# Patient Record
Sex: Female | Born: 1945 | Race: Black or African American | Hispanic: No | Marital: Single | Smoking: Former smoker
Health system: Southern US, Community
[De-identification: ages and names within clinical notes are randomized; demographics above are authoritative.]

## PROBLEM LIST (undated history)

## (undated) DIAGNOSIS — E785 Hyperlipidemia, unspecified: Secondary | ICD-10-CM

## (undated) DIAGNOSIS — I350 Nonrheumatic aortic (valve) stenosis: Secondary | ICD-10-CM

## (undated) DIAGNOSIS — K219 Gastro-esophageal reflux disease without esophagitis: Secondary | ICD-10-CM

## (undated) DIAGNOSIS — M51369 Other intervertebral disc degeneration, lumbar region without mention of lumbar back pain or lower extremity pain: Secondary | ICD-10-CM

## (undated) DIAGNOSIS — T7840XA Allergy, unspecified, initial encounter: Secondary | ICD-10-CM

## (undated) DIAGNOSIS — G709 Myoneural disorder, unspecified: Secondary | ICD-10-CM

## (undated) DIAGNOSIS — I10 Essential (primary) hypertension: Secondary | ICD-10-CM

## (undated) DIAGNOSIS — G4730 Sleep apnea, unspecified: Secondary | ICD-10-CM

## (undated) DIAGNOSIS — J45901 Unspecified asthma with (acute) exacerbation: Secondary | ICD-10-CM

## (undated) DIAGNOSIS — J309 Allergic rhinitis, unspecified: Secondary | ICD-10-CM

## (undated) DIAGNOSIS — D649 Anemia, unspecified: Secondary | ICD-10-CM

## (undated) DIAGNOSIS — M1711 Unilateral primary osteoarthritis, right knee: Secondary | ICD-10-CM

## (undated) DIAGNOSIS — E7800 Pure hypercholesterolemia, unspecified: Secondary | ICD-10-CM

## (undated) DIAGNOSIS — Z952 Presence of prosthetic heart valve: Secondary | ICD-10-CM

## (undated) DIAGNOSIS — E669 Obesity, unspecified: Secondary | ICD-10-CM

## (undated) DIAGNOSIS — R011 Cardiac murmur, unspecified: Secondary | ICD-10-CM

## (undated) DIAGNOSIS — E119 Type 2 diabetes mellitus without complications: Secondary | ICD-10-CM

## (undated) DIAGNOSIS — M1990 Unspecified osteoarthritis, unspecified site: Secondary | ICD-10-CM

## (undated) DIAGNOSIS — R1032 Left lower quadrant pain: Secondary | ICD-10-CM

## (undated) DIAGNOSIS — M4800 Spinal stenosis, site unspecified: Secondary | ICD-10-CM

## (undated) DIAGNOSIS — H269 Unspecified cataract: Secondary | ICD-10-CM

## (undated) DIAGNOSIS — R609 Edema, unspecified: Secondary | ICD-10-CM

## (undated) DIAGNOSIS — J45909 Unspecified asthma, uncomplicated: Secondary | ICD-10-CM

## (undated) DIAGNOSIS — F329 Major depressive disorder, single episode, unspecified: Secondary | ICD-10-CM

## (undated) DIAGNOSIS — M1710 Unilateral primary osteoarthritis, unspecified knee: Secondary | ICD-10-CM

## (undated) DIAGNOSIS — F411 Generalized anxiety disorder: Secondary | ICD-10-CM

## (undated) DIAGNOSIS — L259 Unspecified contact dermatitis, unspecified cause: Secondary | ICD-10-CM

## (undated) DIAGNOSIS — C50919 Malignant neoplasm of unspecified site of unspecified female breast: Secondary | ICD-10-CM

## (undated) DIAGNOSIS — Z923 Personal history of irradiation: Secondary | ICD-10-CM

## (undated) DIAGNOSIS — N951 Menopausal and female climacteric states: Secondary | ICD-10-CM

## (undated) DIAGNOSIS — M5136 Other intervertebral disc degeneration, lumbar region: Secondary | ICD-10-CM

## (undated) HISTORY — DX: Unspecified cataract: H26.9

## (undated) HISTORY — DX: Unilateral primary osteoarthritis, unspecified knee: M17.10

## (undated) HISTORY — DX: Essential (primary) hypertension: I10

## (undated) HISTORY — DX: Obesity, unspecified: E66.9

## (undated) HISTORY — DX: Unspecified asthma, uncomplicated: J45.909

## (undated) HISTORY — PX: CARDIAC VALVE REPLACEMENT: SHX585

## (undated) HISTORY — DX: Spinal stenosis, site unspecified: M48.00

## (undated) HISTORY — DX: Generalized anxiety disorder: F41.1

## (undated) HISTORY — PX: BACK SURGERY: SHX140

## (undated) HISTORY — DX: Type 2 diabetes mellitus without complications: E11.9

## (undated) HISTORY — DX: Malignant neoplasm of unspecified site of unspecified female breast: C50.919

## (undated) HISTORY — DX: Unspecified contact dermatitis, unspecified cause: L25.9

## (undated) HISTORY — DX: Left lower quadrant pain: R10.32

## (undated) HISTORY — DX: Major depressive disorder, single episode, unspecified: F32.9

## (undated) HISTORY — DX: Allergic rhinitis, unspecified: J30.9

## (undated) HISTORY — DX: Pure hypercholesterolemia, unspecified: E78.00

## (undated) HISTORY — PX: CATARACT EXTRACTION: SUR2

## (undated) HISTORY — DX: Gastro-esophageal reflux disease without esophagitis: K21.9

## (undated) HISTORY — DX: Anemia, unspecified: D64.9

## (undated) HISTORY — DX: Myoneural disorder, unspecified: G70.9

## (undated) HISTORY — DX: Edema, unspecified: R60.9

## (undated) HISTORY — DX: Unilateral primary osteoarthritis, right knee: M17.11

## (undated) HISTORY — PX: THYROIDECTOMY, PARTIAL: SHX18

## (undated) HISTORY — DX: Nonrheumatic aortic (valve) stenosis: I35.0

## (undated) HISTORY — PX: CARDIAC CATHETERIZATION: SHX172

## (undated) HISTORY — DX: Hyperlipidemia, unspecified: E78.5

## (undated) HISTORY — PX: SHOULDER ARTHROSCOPY: SHX128

## (undated) HISTORY — DX: Sleep apnea, unspecified: G47.30

## (undated) HISTORY — PX: OTHER SURGICAL HISTORY: SHX169

## (undated) HISTORY — PX: JOINT REPLACEMENT: SHX530

## (undated) HISTORY — DX: Allergy, unspecified, initial encounter: T78.40XA

## (undated) HISTORY — PX: CARPAL TUNNEL RELEASE: SHX101

## (undated) HISTORY — DX: Unspecified asthma with (acute) exacerbation: J45.901

## (undated) HISTORY — DX: Unspecified osteoarthritis, unspecified site: M19.90

## (undated) HISTORY — PX: SPINE SURGERY: SHX786

## (undated) HISTORY — DX: Menopausal and female climacteric states: N95.1

---

## 1974-04-29 HISTORY — PX: OTHER SURGICAL HISTORY: SHX169

## 2000-08-22 ENCOUNTER — Encounter: Payer: Self-pay | Admitting: Endocrinology

## 2000-08-22 ENCOUNTER — Ambulatory Visit (HOSPITAL_COMMUNITY): Admission: RE | Admit: 2000-08-22 | Discharge: 2000-08-22 | Payer: Self-pay | Admitting: Endocrinology

## 2000-09-23 ENCOUNTER — Other Ambulatory Visit: Admission: RE | Admit: 2000-09-23 | Discharge: 2000-09-23 | Payer: Self-pay | Admitting: Internal Medicine

## 2000-10-01 ENCOUNTER — Other Ambulatory Visit: Admission: RE | Admit: 2000-10-01 | Discharge: 2000-10-01 | Payer: Self-pay | Admitting: Obstetrics and Gynecology

## 2000-10-01 ENCOUNTER — Encounter (INDEPENDENT_AMBULATORY_CARE_PROVIDER_SITE_OTHER): Payer: Self-pay

## 2002-02-15 ENCOUNTER — Encounter: Admission: RE | Admit: 2002-02-15 | Discharge: 2002-02-15 | Payer: Self-pay | Admitting: Neurosurgery

## 2002-02-15 ENCOUNTER — Encounter: Payer: Self-pay | Admitting: Neurosurgery

## 2002-03-31 ENCOUNTER — Encounter: Payer: Self-pay | Admitting: Neurosurgery

## 2002-04-06 ENCOUNTER — Encounter: Payer: Self-pay | Admitting: Neurosurgery

## 2002-04-06 ENCOUNTER — Inpatient Hospital Stay (HOSPITAL_COMMUNITY): Admission: RE | Admit: 2002-04-06 | Discharge: 2002-04-12 | Payer: Self-pay | Admitting: Neurosurgery

## 2002-05-05 ENCOUNTER — Encounter: Payer: Self-pay | Admitting: Neurosurgery

## 2002-05-05 ENCOUNTER — Encounter: Admission: RE | Admit: 2002-05-05 | Discharge: 2002-05-05 | Payer: Self-pay | Admitting: Neurosurgery

## 2002-05-30 ENCOUNTER — Emergency Department (HOSPITAL_COMMUNITY): Admission: EM | Admit: 2002-05-30 | Discharge: 2002-05-30 | Payer: Self-pay | Admitting: Emergency Medicine

## 2002-05-31 ENCOUNTER — Encounter: Payer: Self-pay | Admitting: Neurosurgery

## 2002-05-31 ENCOUNTER — Encounter: Admission: RE | Admit: 2002-05-31 | Discharge: 2002-05-31 | Payer: Self-pay | Admitting: Neurosurgery

## 2002-06-07 ENCOUNTER — Inpatient Hospital Stay (HOSPITAL_COMMUNITY): Admission: AD | Admit: 2002-06-07 | Discharge: 2002-06-18 | Payer: Self-pay | Admitting: Internal Medicine

## 2002-06-08 ENCOUNTER — Encounter: Payer: Self-pay | Admitting: Internal Medicine

## 2002-06-10 ENCOUNTER — Encounter: Payer: Self-pay | Admitting: Internal Medicine

## 2002-06-11 ENCOUNTER — Encounter: Payer: Self-pay | Admitting: Endocrinology

## 2002-06-17 ENCOUNTER — Encounter: Payer: Self-pay | Admitting: Internal Medicine

## 2002-06-23 ENCOUNTER — Encounter (HOSPITAL_BASED_OUTPATIENT_CLINIC_OR_DEPARTMENT_OTHER): Admission: RE | Admit: 2002-06-23 | Discharge: 2002-09-21 | Payer: Self-pay | Admitting: Internal Medicine

## 2002-08-02 ENCOUNTER — Encounter: Payer: Self-pay | Admitting: Emergency Medicine

## 2002-08-02 ENCOUNTER — Inpatient Hospital Stay (HOSPITAL_COMMUNITY): Admission: EM | Admit: 2002-08-02 | Discharge: 2002-08-03 | Payer: Self-pay | Admitting: Emergency Medicine

## 2002-08-03 ENCOUNTER — Encounter: Payer: Self-pay | Admitting: Endocrinology

## 2002-08-30 ENCOUNTER — Encounter: Admission: RE | Admit: 2002-08-30 | Discharge: 2002-08-30 | Payer: Self-pay | Admitting: Neurosurgery

## 2002-08-30 ENCOUNTER — Encounter: Payer: Self-pay | Admitting: Neurosurgery

## 2002-09-30 ENCOUNTER — Encounter (HOSPITAL_BASED_OUTPATIENT_CLINIC_OR_DEPARTMENT_OTHER): Admission: RE | Admit: 2002-09-30 | Discharge: 2002-12-29 | Payer: Self-pay | Admitting: Internal Medicine

## 2002-11-29 ENCOUNTER — Encounter: Admission: RE | Admit: 2002-11-29 | Discharge: 2002-11-29 | Payer: Self-pay | Admitting: Neurosurgery

## 2002-11-29 ENCOUNTER — Encounter: Payer: Self-pay | Admitting: Neurosurgery

## 2003-05-17 ENCOUNTER — Encounter: Admission: RE | Admit: 2003-05-17 | Discharge: 2003-05-17 | Payer: Self-pay | Admitting: Neurosurgery

## 2004-03-27 ENCOUNTER — Ambulatory Visit: Payer: Self-pay | Admitting: Adult Health

## 2005-01-01 ENCOUNTER — Ambulatory Visit: Payer: Self-pay | Admitting: Internal Medicine

## 2005-05-06 ENCOUNTER — Ambulatory Visit: Payer: Self-pay | Admitting: Internal Medicine

## 2005-05-08 ENCOUNTER — Ambulatory Visit: Payer: Self-pay | Admitting: Internal Medicine

## 2005-05-22 ENCOUNTER — Ambulatory Visit: Payer: Self-pay | Admitting: Internal Medicine

## 2005-09-24 ENCOUNTER — Ambulatory Visit: Payer: Self-pay | Admitting: Internal Medicine

## 2006-10-09 ENCOUNTER — Ambulatory Visit: Payer: Self-pay | Admitting: Internal Medicine

## 2006-10-09 LAB — CONVERTED CEMR LAB
Albumin: 4 g/dL (ref 3.5–5.2)
Alkaline Phosphatase: 58 units/L (ref 39–117)
Basophils Relative: 0.9 % (ref 0.0–1.0)
Chloride: 106 meq/L (ref 96–112)
Crystals: NEGATIVE
Eosinophils Absolute: 0.8 10*3/uL — ABNORMAL HIGH (ref 0.0–0.6)
Eosinophils Relative: 6.9 % — ABNORMAL HIGH (ref 0.0–5.0)
GFR calc non Af Amer: 90 mL/min
Glucose, Bld: 111 mg/dL — ABNORMAL HIGH (ref 70–99)
Hemoglobin, Urine: NEGATIVE
Hgb A1c MFr Bld: 6.4 % — ABNORMAL HIGH (ref 4.6–6.0)
LDL Cholesterol: 80 mg/dL (ref 0–99)
MCV: 81.8 fL (ref 78.0–100.0)
Microalb, Ur: 2.3 mg/dL — ABNORMAL HIGH (ref 0.0–1.9)
Monocytes Absolute: 0.8 10*3/uL — ABNORMAL HIGH (ref 0.2–0.7)
Monocytes Relative: 7.5 % (ref 3.0–11.0)
Neutrophils Relative %: 50.9 % (ref 43.0–77.0)
Nitrite: NEGATIVE
Platelets: 280 10*3/uL (ref 150–400)
Potassium: 4.1 meq/L (ref 3.5–5.1)
RBC: 4.94 M/uL (ref 3.87–5.11)
TSH: 1.81 microintl units/mL (ref 0.35–5.50)
Total Protein, Urine: 30 mg/dL — AB
Total Protein: 7.7 g/dL (ref 6.0–8.3)
Triglycerides: 89 mg/dL (ref 0–149)
Urine Glucose: NEGATIVE mg/dL
WBC: 11.1 10*3/uL — ABNORMAL HIGH (ref 4.5–10.5)
pH: 6 (ref 5.0–8.0)

## 2006-10-16 ENCOUNTER — Ambulatory Visit: Payer: Self-pay | Admitting: Family Medicine

## 2006-11-05 ENCOUNTER — Encounter: Payer: Self-pay | Admitting: Internal Medicine

## 2006-11-05 ENCOUNTER — Ambulatory Visit: Payer: Self-pay

## 2007-04-30 HISTORY — PX: COLONOSCOPY: SHX174

## 2007-05-11 ENCOUNTER — Encounter: Payer: Self-pay | Admitting: Internal Medicine

## 2007-08-24 ENCOUNTER — Ambulatory Visit: Payer: Self-pay | Admitting: Internal Medicine

## 2007-08-24 DIAGNOSIS — F32A Depression, unspecified: Secondary | ICD-10-CM | POA: Insufficient documentation

## 2007-08-24 DIAGNOSIS — R609 Edema, unspecified: Secondary | ICD-10-CM

## 2007-08-24 DIAGNOSIS — L259 Unspecified contact dermatitis, unspecified cause: Secondary | ICD-10-CM

## 2007-08-24 DIAGNOSIS — M1990 Unspecified osteoarthritis, unspecified site: Secondary | ICD-10-CM | POA: Insufficient documentation

## 2007-08-24 DIAGNOSIS — E669 Obesity, unspecified: Secondary | ICD-10-CM

## 2007-08-24 DIAGNOSIS — K219 Gastro-esophageal reflux disease without esophagitis: Secondary | ICD-10-CM | POA: Insufficient documentation

## 2007-08-24 DIAGNOSIS — F329 Major depressive disorder, single episode, unspecified: Secondary | ICD-10-CM

## 2007-08-24 DIAGNOSIS — E119 Type 2 diabetes mellitus without complications: Secondary | ICD-10-CM | POA: Insufficient documentation

## 2007-08-24 DIAGNOSIS — E1122 Type 2 diabetes mellitus with diabetic chronic kidney disease: Secondary | ICD-10-CM | POA: Insufficient documentation

## 2007-08-24 DIAGNOSIS — J45909 Unspecified asthma, uncomplicated: Secondary | ICD-10-CM | POA: Insufficient documentation

## 2007-08-24 DIAGNOSIS — J309 Allergic rhinitis, unspecified: Secondary | ICD-10-CM

## 2007-08-24 DIAGNOSIS — N951 Menopausal and female climacteric states: Secondary | ICD-10-CM

## 2007-08-24 DIAGNOSIS — F411 Generalized anxiety disorder: Secondary | ICD-10-CM | POA: Insufficient documentation

## 2007-08-24 DIAGNOSIS — F3289 Other specified depressive episodes: Secondary | ICD-10-CM

## 2007-08-24 DIAGNOSIS — E7800 Pure hypercholesterolemia, unspecified: Secondary | ICD-10-CM

## 2007-08-24 DIAGNOSIS — M4800 Spinal stenosis, site unspecified: Secondary | ICD-10-CM

## 2007-08-24 DIAGNOSIS — E785 Hyperlipidemia, unspecified: Secondary | ICD-10-CM | POA: Insufficient documentation

## 2007-08-24 DIAGNOSIS — I10 Essential (primary) hypertension: Secondary | ICD-10-CM

## 2007-08-24 HISTORY — DX: Unspecified osteoarthritis, unspecified site: M19.90

## 2007-08-24 HISTORY — DX: Spinal stenosis, site unspecified: M48.00

## 2007-08-24 HISTORY — DX: Major depressive disorder, single episode, unspecified: F32.9

## 2007-08-24 HISTORY — DX: Edema, unspecified: R60.9

## 2007-08-24 HISTORY — DX: Hyperlipidemia, unspecified: E78.5

## 2007-08-24 HISTORY — DX: Menopausal and female climacteric states: N95.1

## 2007-08-24 HISTORY — DX: Type 2 diabetes mellitus without complications: E11.9

## 2007-08-24 HISTORY — DX: Other specified depressive episodes: F32.89

## 2007-08-24 HISTORY — DX: Unspecified asthma, uncomplicated: J45.909

## 2007-08-24 HISTORY — DX: Allergic rhinitis, unspecified: J30.9

## 2007-08-24 HISTORY — DX: Unspecified contact dermatitis, unspecified cause: L25.9

## 2007-08-24 HISTORY — DX: Pure hypercholesterolemia, unspecified: E78.00

## 2007-08-24 HISTORY — DX: Gastro-esophageal reflux disease without esophagitis: K21.9

## 2007-08-24 HISTORY — DX: Essential (primary) hypertension: I10

## 2007-08-24 HISTORY — DX: Generalized anxiety disorder: F41.1

## 2007-08-24 HISTORY — DX: Obesity, unspecified: E66.9

## 2007-09-17 ENCOUNTER — Ambulatory Visit: Payer: Self-pay | Admitting: Gastroenterology

## 2007-09-23 ENCOUNTER — Ambulatory Visit: Payer: Self-pay | Admitting: Internal Medicine

## 2007-09-23 ENCOUNTER — Telehealth: Payer: Self-pay | Admitting: Internal Medicine

## 2007-09-24 ENCOUNTER — Encounter: Payer: Self-pay | Admitting: Internal Medicine

## 2007-09-29 ENCOUNTER — Ambulatory Visit: Payer: Self-pay | Admitting: Gastroenterology

## 2007-10-16 ENCOUNTER — Ambulatory Visit: Payer: Self-pay | Admitting: Internal Medicine

## 2007-10-16 LAB — CONVERTED CEMR LAB
Basophils Relative: 1.4 % — ABNORMAL HIGH (ref 0.0–1.0)
Bilirubin Urine: NEGATIVE
Bilirubin, Direct: 0.1 mg/dL (ref 0.0–0.3)
Cholesterol: 176 mg/dL (ref 0–200)
Eosinophils Absolute: 0.2 10*3/uL (ref 0.0–0.7)
Eosinophils Relative: 2.6 % (ref 0.0–5.0)
Glucose, Bld: 129 mg/dL — ABNORMAL HIGH (ref 70–99)
HDL: 68.4 mg/dL (ref >39.0)
Hemoglobin, Urine: NEGATIVE
Hemoglobin: 12.9 g/dL (ref 12.0–15.0)
LDL Cholesterol: 93 mg/dL (ref 0–99)
Leukocytes, UA: NEGATIVE
MCHC: 33.4 g/dL (ref 30.0–36.0)
MCV: 81.8 fL (ref 78.0–100.0)
Monocytes Absolute: 0.6 10*3/uL (ref 0.1–1.0)
Neutro Abs: 5.1 10*3/uL (ref 1.4–7.7)
Neutrophils Relative %: 52.2 % (ref 43.0–77.0)
Platelets: 229 10*3/uL (ref 150–400)
Potassium: 3.8 meq/L (ref 3.5–5.1)
Total Bilirubin: 0.7 mg/dL (ref 0.3–1.2)
Total Protein: 7.1 g/dL (ref 6.0–8.3)
Triglycerides: 73 mg/dL (ref 0–149)
Urine Glucose: NEGATIVE mg/dL
Urobilinogen, UA: 0.2 (ref 0.0–1.0)
VLDL: 15 mg/dL (ref 0–40)

## 2007-10-23 ENCOUNTER — Ambulatory Visit: Payer: Self-pay | Admitting: Internal Medicine

## 2008-03-14 ENCOUNTER — Ambulatory Visit: Payer: Self-pay | Admitting: Internal Medicine

## 2008-03-14 DIAGNOSIS — J45901 Unspecified asthma with (acute) exacerbation: Secondary | ICD-10-CM

## 2008-03-14 HISTORY — DX: Unspecified asthma with (acute) exacerbation: J45.901

## 2008-03-14 LAB — CONVERTED CEMR LAB
BUN: 11 mg/dL (ref 6–23)
Calcium: 9.4 mg/dL (ref 8.4–10.5)
Chloride: 105 meq/L (ref 96–112)
Cholesterol: 159 mg/dL (ref 0–200)
GFR calc non Af Amer: 67 mL/min
Glucose, Bld: 135 mg/dL — ABNORMAL HIGH (ref 70–99)
HDL: 47.9 mg/dL (ref >39.0)
Hgb A1c MFr Bld: 6.3 % — ABNORMAL HIGH (ref 4.6–6.0)
LDL Cholesterol: 88 mg/dL (ref 0–99)
Triglycerides: 116 mg/dL (ref 0–149)
VLDL: 23 mg/dL (ref 0–40)

## 2008-03-17 ENCOUNTER — Telehealth: Payer: Self-pay | Admitting: Internal Medicine

## 2008-06-28 ENCOUNTER — Encounter: Payer: Self-pay | Admitting: Internal Medicine

## 2008-07-02 ENCOUNTER — Encounter: Admission: RE | Admit: 2008-07-02 | Discharge: 2008-07-02 | Payer: Self-pay | Admitting: Neurosurgery

## 2008-07-26 ENCOUNTER — Telehealth (INDEPENDENT_AMBULATORY_CARE_PROVIDER_SITE_OTHER): Payer: Self-pay | Admitting: *Deleted

## 2008-09-12 ENCOUNTER — Ambulatory Visit: Payer: Self-pay | Admitting: Internal Medicine

## 2008-09-12 DIAGNOSIS — R1032 Left lower quadrant pain: Secondary | ICD-10-CM

## 2008-09-12 HISTORY — DX: Left lower quadrant pain: R10.32

## 2008-09-12 LAB — CONVERTED CEMR LAB
AST: 26 units/L (ref 0–37)
BUN: 12 mg/dL (ref 6–23)
Basophils Relative: 1.5 % (ref 0.0–3.0)
Bilirubin Urine: NEGATIVE
Bilirubin, Direct: 0.1 mg/dL (ref 0.0–0.3)
Calcium: 9.8 mg/dL (ref 8.4–10.5)
Chloride: 108 meq/L (ref 96–112)
Creatinine, Ser: 0.8 mg/dL (ref 0.4–1.2)
Creatinine,U: 75.7 mg/dL
Eosinophils Absolute: 0.4 10*3/uL (ref 0.0–0.7)
Glucose, Bld: 125 mg/dL — ABNORMAL HIGH (ref 70–99)
Hemoglobin: 14.2 g/dL (ref 12.0–15.0)
Hgb A1c MFr Bld: 6.8 % — ABNORMAL HIGH (ref 4.6–6.5)
Ketones, ur: NEGATIVE mg/dL
LDL Cholesterol: 88 mg/dL (ref 0–99)
Leukocytes, UA: NEGATIVE
Lymphocytes Relative: 33.2 % (ref 12.0–46.0)
MCHC: 33.1 g/dL (ref 30.0–36.0)
MCV: 82.3 fL (ref 78.0–100.0)
Monocytes Absolute: 0.6 10*3/uL (ref 0.1–1.0)
Neutrophils Relative %: 56.8 % (ref 43.0–77.0)
Platelets: 250 10*3/uL (ref 150.0–400.0)
RBC: 5.19 M/uL — ABNORMAL HIGH (ref 3.87–5.11)
Sodium: 146 meq/L — ABNORMAL HIGH (ref 135–145)
Total CHOL/HDL Ratio: 3
Total Protein: 7.8 g/dL (ref 6.0–8.3)
VLDL: 19 mg/dL (ref 0.0–40.0)

## 2008-09-15 LAB — CONVERTED CEMR LAB: Vit D, 25-Hydroxy: 13 ng/mL — ABNORMAL LOW (ref 30–89)

## 2008-09-19 ENCOUNTER — Encounter: Admission: RE | Admit: 2008-09-19 | Discharge: 2008-09-19 | Payer: Self-pay | Admitting: Internal Medicine

## 2008-09-21 ENCOUNTER — Encounter: Payer: Self-pay | Admitting: Internal Medicine

## 2008-09-21 ENCOUNTER — Encounter: Admission: RE | Admit: 2008-09-21 | Discharge: 2008-09-21 | Payer: Self-pay | Admitting: Internal Medicine

## 2008-09-21 LAB — HM MAMMOGRAPHY

## 2008-12-01 ENCOUNTER — Encounter: Admission: RE | Admit: 2008-12-01 | Discharge: 2008-12-01 | Payer: Self-pay | Admitting: Neurosurgery

## 2009-01-09 ENCOUNTER — Ambulatory Visit: Payer: Self-pay | Admitting: Internal Medicine

## 2009-01-09 DIAGNOSIS — IMO0002 Reserved for concepts with insufficient information to code with codable children: Secondary | ICD-10-CM | POA: Insufficient documentation

## 2009-01-09 DIAGNOSIS — M1710 Unilateral primary osteoarthritis, unspecified knee: Secondary | ICD-10-CM

## 2009-01-09 HISTORY — DX: Reserved for concepts with insufficient information to code with codable children: IMO0002

## 2009-01-12 ENCOUNTER — Telehealth (INDEPENDENT_AMBULATORY_CARE_PROVIDER_SITE_OTHER): Payer: Self-pay | Admitting: *Deleted

## 2009-01-16 ENCOUNTER — Ambulatory Visit: Payer: Self-pay

## 2009-01-16 ENCOUNTER — Encounter: Payer: Self-pay | Admitting: Internal Medicine

## 2009-01-18 ENCOUNTER — Ambulatory Visit: Payer: Self-pay

## 2009-02-10 ENCOUNTER — Inpatient Hospital Stay (HOSPITAL_COMMUNITY): Admission: RE | Admit: 2009-02-10 | Discharge: 2009-02-17 | Payer: Self-pay | Admitting: Neurosurgery

## 2009-03-13 ENCOUNTER — Ambulatory Visit: Payer: Self-pay | Admitting: Internal Medicine

## 2009-05-05 ENCOUNTER — Telehealth (INDEPENDENT_AMBULATORY_CARE_PROVIDER_SITE_OTHER): Payer: Self-pay | Admitting: *Deleted

## 2009-06-13 ENCOUNTER — Encounter: Admission: RE | Admit: 2009-06-13 | Discharge: 2009-06-13 | Payer: Self-pay | Admitting: Neurosurgery

## 2009-09-08 ENCOUNTER — Ambulatory Visit: Payer: Self-pay | Admitting: Internal Medicine

## 2009-09-08 LAB — CONVERTED CEMR LAB
ALT: 37 units/L — ABNORMAL HIGH (ref 0–35)
AST: 28 units/L (ref 0–37)
Albumin: 4.1 g/dL (ref 3.5–5.2)
Alkaline Phosphatase: 66 units/L (ref 39–117)
BUN: 12 mg/dL (ref 6–23)
Basophils Absolute: 0.1 10*3/uL (ref 0.0–0.1)
Basophils Relative: 0.6 % (ref 0.0–3.0)
Bilirubin, Direct: 0.1 mg/dL (ref 0.0–0.3)
Creatinine, Ser: 0.5 mg/dL (ref 0.4–1.2)
Creatinine,U: 134.8 mg/dL
Eosinophils Relative: 4.7 % (ref 0.0–5.0)
Hemoglobin: 13.4 g/dL (ref 12.0–15.0)
Hgb A1c MFr Bld: 6.4 % (ref 4.6–6.5)
Lymphocytes Relative: 37.7 % (ref 12.0–46.0)
Lymphs Abs: 3.7 10*3/uL (ref 0.7–4.0)
Microalb Creat Ratio: 2 mg/g (ref 0.0–30.0)
Microalb, Ur: 2.7 mg/dL — ABNORMAL HIGH (ref 0.0–1.9)
Neutrophils Relative %: 49.8 % (ref 43.0–77.0)
Platelets: 215 10*3/uL (ref 150.0–400.0)
Potassium: 4.2 meq/L (ref 3.5–5.1)
RBC: 4.94 M/uL (ref 3.87–5.11)
Sodium: 146 meq/L — ABNORMAL HIGH (ref 135–145)
Specific Gravity, Urine: 1.02 (ref 1.000–1.030)
TSH: 1.65 microintl units/mL (ref 0.35–5.50)
Total Bilirubin: 0.7 mg/dL (ref 0.3–1.2)
Urine Glucose: NEGATIVE mg/dL
pH: 7 (ref 5.0–8.0)

## 2009-09-12 ENCOUNTER — Ambulatory Visit: Payer: Self-pay | Admitting: Internal Medicine

## 2009-10-25 ENCOUNTER — Telehealth: Payer: Self-pay | Admitting: Internal Medicine

## 2010-03-12 ENCOUNTER — Ambulatory Visit: Payer: Self-pay | Admitting: Internal Medicine

## 2010-03-12 LAB — CONVERTED CEMR LAB
BUN: 12 mg/dL (ref 6–23)
Cholesterol: 141 mg/dL (ref 0–200)
Creatinine, Ser: 0.7 mg/dL (ref 0.4–1.2)
GFR calc non Af Amer: 103 mL/min (ref >60)
Hgb A1c MFr Bld: 6.5 % (ref 4.6–6.5)
Potassium: 4.4 meq/L (ref 3.5–5.1)
Total CHOL/HDL Ratio: 2

## 2010-03-15 ENCOUNTER — Ambulatory Visit: Payer: Self-pay | Admitting: Internal Medicine

## 2010-04-29 HISTORY — PX: KNEE ARTHROPLASTY: SHX992

## 2010-05-27 LAB — CONVERTED CEMR LAB
BUN: 12 mg/dL (ref 6–23)
CO2: 28 meq/L (ref 19–32)
Calcium: 9.2 mg/dL (ref 8.4–10.5)
Chloride: 109 meq/L (ref 96–112)
Cholesterol: 159 mg/dL (ref 0–200)
GFR calc non Af Amer: 108.52 mL/min (ref >60)
Glucose, Bld: 152 mg/dL — ABNORMAL HIGH (ref 70–99)
Hgb A1c MFr Bld: 7.3 % — ABNORMAL HIGH (ref 4.6–6.5)
LDL Cholesterol: 86 mg/dL (ref 0–99)
Total CHOL/HDL Ratio: 3
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
## 2010-05-29 NOTE — Progress Notes (Signed)
Summary: Rx refill req  Phone Note Refill Request Message from:  Pharmacy on October 25, 2009 9:47 AM  Refills Requested: Medication #1:  GLUCOPHAGE XR 500 MG TB24 4 by mouth once daily   Supply Requested: 6 months Refill requested from [facility] in [facility] [phone]. 30 day supply #120 x 0 given verbally to Pharmacist Chip Boer. Pt is visiting the state.   Method Requested: Electronic Initial call taken by: Margaret Pyle, CMA,  October 25, 2009 9:48 AM    Prescriptions: GLUCOPHAGE XR 500 MG TB24 (METFORMIN HCL) 4 by mouth once daily  #120 Each x 5   Entered by:   Margaret Pyle, CMA   Authorized by:   Minus Breeding MD   Signed by:   Margaret Pyle, CMA on 10/25/2009   Method used:   Electronically to        [facility]  [phone]* (retail)       [address]       [place], [place]  [address]       Ph: [phone] or [phone]       Fax: [phone]   RxID:   [id number]
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
## 2010-05-29 NOTE — Progress Notes (Signed)
Summary: Record request from [facility]  Request for records received from [facility], [place]. Request forwarded to [employer]. Dena Chavis  May 05, 2009 4:20 PM

## 2010-06-05 ENCOUNTER — Telehealth: Payer: Self-pay | Admitting: Internal Medicine

## 2010-06-08 ENCOUNTER — Telehealth: Payer: Self-pay | Admitting: Internal Medicine

## 2010-06-14 NOTE — Progress Notes (Signed)
Summary: ?  Phone Note Call from Patient Call back at [facility] Phone [phone]   Summary of Call: Pt has low grade fever, slight diarrhea and nausea. Phenergan has helped w/nausea. Should she be concerned or continue to monitor?  Initial call taken by: Lamar Sprinkles, CMA,  June 08, 2010 11:19 AM  Follow-up for Phone Call        this is c/w the Very Common recent Norovirus infection "going around" in the past 2 -3 wks; she should stop the lasix temporarily if there is no swelling in the legs especially if there is dizziness, lightheaded and weakness, sluggish, hard to walk Follow-up by: Corwin Levins MD,  June 08, 2010 11:23 AM  Additional Follow-up for Phone Call Additional follow up Details #1::        pt informed of MD's advisement Additional Follow-up by: Brenton Grills CMA Duncan Dull),  June 08, 2010 12:31 PM

## 2010-06-14 NOTE — Progress Notes (Signed)
Summary: OV?   Phone Note Call from Patient Call back at [facility] Phone [phone]   Summary of Call: Patient is requesting a call back - wants to know if she needs office visit? C/o h/a, nausea, diarrhea & temp 100.8.  Initial call taken by: Lamar Sprinkles, CMA,  June 05, 2010 2:51 PM  Follow-up for Phone Call        we can try phenergan as needed, and pt should do immodium as needed, but from this sounds like acute viral GI illness -  pt should make OV if feels worse overall, vomiting, chills, cough, sob/doe, abd pain or blood  should also consider OV if sugars increase > 250 Follow-up by: Corwin Levins MD,  June 05, 2010 3:22 PM  Additional Follow-up for Phone Call Additional follow up Details #1::        Notified pt back with md reponse Additional Follow-up by: Orlan Leavens RMA,  June 05, 2010 3:28 PM    New/Updated Medications: PROMETHAZINE HCL 25 MG TABS (PROMETHAZINE HCL) 1 by mouth q 6 hrs as needed nausea Prescriptions: PROMETHAZINE HCL 25 MG TABS (PROMETHAZINE HCL) 1 by mouth q 6 hrs as needed nausea  #40 x 1   Entered and Authorized by:   Corwin Levins MD   Signed by:   Corwin Levins MD on 06/05/2010   Method used:   Electronically to        [facility]  [phone]* (retail)       [address]       [place], [place]  [address]       Ph: [phone] or [phone]       Fax: [phone]   RxID:   [id number]

## 2010-08-02 LAB — BASIC METABOLIC PANEL
CO2: 29 mEq/L (ref 19–32)
Calcium: 9.9 mg/dL (ref 8.4–10.5)
Creatinine, Ser: 0.71 mg/dL (ref 0.4–1.2)
GFR calc Af Amer: >60 mL/min (ref >60)
GFR calc non Af Amer: >60 mL/min (ref >60)
Sodium: 144 mEq/L (ref 135–145)

## 2010-08-02 LAB — URINE CULTURE: Special Requests: NEGATIVE

## 2010-08-02 LAB — GLUCOSE, CAPILLARY
Glucose-Capillary: 105 mg/dL — ABNORMAL HIGH (ref 70–99)
Glucose-Capillary: 109 mg/dL — ABNORMAL HIGH (ref 70–99)
Glucose-Capillary: 110 mg/dL — ABNORMAL HIGH (ref 70–99)
Glucose-Capillary: 128 mg/dL — ABNORMAL HIGH (ref 70–99)
Glucose-Capillary: 139 mg/dL — ABNORMAL HIGH (ref 70–99)
Glucose-Capillary: 153 mg/dL — ABNORMAL HIGH (ref 70–99)
Glucose-Capillary: 153 mg/dL — ABNORMAL HIGH (ref 70–99)

## 2010-08-02 LAB — ABO/RH: ABO/RH(D): A POS

## 2010-08-02 LAB — CBC
Hemoglobin: 14.1 g/dL (ref 12.0–15.0)
MCHC: 33.8 g/dL (ref 30.0–36.0)
RBC: 3.64 MIL/uL — ABNORMAL LOW (ref 3.87–5.11)
RBC: 5.16 MIL/uL — ABNORMAL HIGH (ref 3.87–5.11)
WBC: 15.1 10*3/uL — ABNORMAL HIGH (ref 4.0–10.5)
WBC: 10.4 10*3/uL (ref 4.0–10.5)

## 2010-08-02 LAB — CULTURE, BLOOD (ROUTINE X 2)
Culture: NO GROWTH
Culture: NO GROWTH

## 2010-08-02 LAB — TYPE AND SCREEN: Antibody Screen: NEGATIVE

## 2010-08-02 LAB — DIFFERENTIAL
Basophils Relative: 1 % (ref 0–1)
Lymphs Abs: 3.4 10*3/uL (ref 0.7–4.0)
Monocytes Relative: 10 % (ref 3–12)
Neutro Abs: 10.1 10*3/uL — ABNORMAL HIGH (ref 1.7–7.7)
Neutrophils Relative %: 67 % (ref 43–77)

## 2010-08-15 ENCOUNTER — Other Ambulatory Visit: Payer: Self-pay | Admitting: Internal Medicine

## 2010-08-16 ENCOUNTER — Other Ambulatory Visit: Payer: Self-pay

## 2010-08-16 MED ORDER — POTASSIUM CHLORIDE ER 10 MEQ PO CPCR: ORAL_CAPSULE | ORAL | Status: DC

## 2010-08-24 ENCOUNTER — Telehealth: Payer: Self-pay

## 2010-08-24 NOTE — Telephone Encounter (Signed)
Per Dr Jonny Ruiz, pt was advised  And transferred to schedule an OV for surgical clearance per [facility]

## 2010-08-30 ENCOUNTER — Encounter: Payer: Self-pay | Admitting: Internal Medicine

## 2010-08-30 DIAGNOSIS — Z0000 Encounter for general adult medical examination without abnormal findings: Secondary | ICD-10-CM | POA: Insufficient documentation

## 2010-09-03 ENCOUNTER — Other Ambulatory Visit (INDEPENDENT_AMBULATORY_CARE_PROVIDER_SITE_OTHER): Payer: Medicare Other

## 2010-09-03 ENCOUNTER — Encounter: Payer: Self-pay | Admitting: Internal Medicine

## 2010-09-03 ENCOUNTER — Ambulatory Visit (INDEPENDENT_AMBULATORY_CARE_PROVIDER_SITE_OTHER): Payer: Medicare Other | Admitting: Internal Medicine

## 2010-09-03 DIAGNOSIS — M1711 Unilateral primary osteoarthritis, right knee: Secondary | ICD-10-CM

## 2010-09-03 DIAGNOSIS — I10 Essential (primary) hypertension: Secondary | ICD-10-CM

## 2010-09-03 DIAGNOSIS — R5381 Other malaise: Secondary | ICD-10-CM

## 2010-09-03 DIAGNOSIS — E785 Hyperlipidemia, unspecified: Secondary | ICD-10-CM

## 2010-09-03 DIAGNOSIS — M1712 Unilateral primary osteoarthritis, left knee: Secondary | ICD-10-CM | POA: Insufficient documentation

## 2010-09-03 DIAGNOSIS — E119 Type 2 diabetes mellitus without complications: Secondary | ICD-10-CM

## 2010-09-03 DIAGNOSIS — R5383 Other fatigue: Secondary | ICD-10-CM

## 2010-09-03 DIAGNOSIS — Z01818 Encounter for other preprocedural examination: Secondary | ICD-10-CM

## 2010-09-03 HISTORY — DX: Unilateral primary osteoarthritis, right knee: M17.11

## 2010-09-03 LAB — CBC WITH DIFFERENTIAL/PLATELET
Eosinophils Relative: 2.9 % (ref 0.0–5.0)
HCT: 39.2 % (ref 36.0–46.0)
Hemoglobin: 13.1 g/dL (ref 12.0–15.0)
Lymphs Abs: 4.2 10*3/uL — ABNORMAL HIGH (ref 0.7–4.0)
Monocytes Relative: 7.9 % (ref 3.0–12.0)
Neutro Abs: 5.9 10*3/uL (ref 1.4–7.7)
RDW: 16 % — ABNORMAL HIGH (ref 11.5–14.6)
WBC: 11.4 10*3/uL — ABNORMAL HIGH (ref 4.5–10.5)

## 2010-09-03 LAB — BASIC METABOLIC PANEL
CO2: 30 mEq/L (ref 19–32)
Calcium: 9.1 mg/dL (ref 8.4–10.5)
Creatinine, Ser: 0.7 mg/dL (ref 0.4–1.2)
GFR: 106.2 mL/min (ref >60.00)
Sodium: 141 mEq/L (ref 135–145)

## 2010-09-03 LAB — LIPID PANEL
HDL: 55.9 mg/dL (ref >39.00)
LDL Cholesterol: 76 mg/dL (ref 0–99)
Total CHOL/HDL Ratio: 3
Triglycerides: 66 mg/dL (ref 0.0–149.0)

## 2010-09-03 LAB — HEPATIC FUNCTION PANEL
Albumin: 3.9 g/dL (ref 3.5–5.2)
Alkaline Phosphatase: 58 U/L (ref 39–117)
Total Bilirubin: 0.7 mg/dL (ref 0.3–1.2)

## 2010-09-03 LAB — MICROALBUMIN / CREATININE URINE RATIO: Microalb, Ur: 2.3 mg/dL — ABNORMAL HIGH (ref 0.0–1.9)

## 2010-09-03 LAB — TSH: TSH: 1.53 u[IU]/mL (ref 0.35–5.50)

## 2010-09-03 NOTE — Assessment & Plan Note (Signed)
stable overall by hx and exam, most recent lab reviewed with pt, and pt to continue medical treatment as before  Lab Results  Component Value Date   WBC 9.9 09/08/2009   HGB 13.4 09/08/2009   HCT 40.3 09/08/2009   PLT 215.0 09/08/2009   CHOL 141 03/12/2010   TRIG 50.0 03/12/2010   HDL 59.50 03/12/2010   ALT 37* 09/08/2009   AST 28 09/08/2009   NA 141 03/12/2010   K 4.4 03/12/2010   CL 106 03/12/2010   CREATININE 0.7 03/12/2010   BUN 12 03/12/2010   CO2 30 03/12/2010   TSH 1.65 09/08/2009   HGBA1C 6.5 03/12/2010   MICROALBUR 2.7* 09/08/2009

## 2010-09-03 NOTE — Patient Instructions (Addendum)
You are cleared for surgury, and the form is to be sent to Dr Olin/orthopedic Please go to LAB in the Basement for the blood and/or urine tests to be done today Please call the number on the Blue Card (the [employer] System) for results of testing in 2-3 days Please return in 6 months

## 2010-09-03 NOTE — Assessment & Plan Note (Signed)
Etiology unclear, Exam otherwise benign, to check labs as documented, follow with expectant management  

## 2010-09-03 NOTE — Assessment & Plan Note (Addendum)
OK for surgury, relatively low risk given her Cardiac risk factors with recent stress test neg and asympt and well controlled risk factors;  Will check labs today but ok to fax form for ok

## 2010-09-03 NOTE — Assessment & Plan Note (Signed)

## 2010-09-03 NOTE — Assessment & Plan Note (Addendum)
stable overall by hx and exam, most recent lab reviewed with pt, and pt to continue medical treatment as before  Lab Results  Component Value Date   HGBA1C 6.5 03/12/2010
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
## 2010-09-03 NOTE — Assessment & Plan Note (Signed)
BP Readings from Last 3 Encounters:  09/03/10 124/68  03/15/10 120/82  09/12/09 110/80   stable overall by hx and exam, most recent lab reviewed with pt, and pt to continue medical treatment as before

## 2010-09-14 NOTE — H&P (Signed)
NAME:  KYNEDI, PROFITT NO.:  [id number]   MEDICAL RECORD NO.:  [id number]                   PATIENT TYPE:  EMS   LOCATION:  ED                                   FACILITY[facility]   PHYSICIAN:  Corwin Levins, M.D. [facility]             DATE OF BIRTH:  March 17, 1946   DATE OF ADMISSION:  DATE OF DISCHARGE:                                HISTORY & PHYSICAL   CHIEF COMPLAINT:  Fever with nausea, vomiting, urinary frequency.   HISTORY OF PRESENT ILLNESS:  The patient is a 65 year old black female  who  over two or three days had increase in temperature, nausea, vomiting,  abdominal pain, and chills this morning with urinary frequency.  She was  seen in the emergency room two days ago and found to have initial blood  pressure 200/111 with final blood pressure three hours later in the 160s  systolic per patient.  She has a history of somewhat labile blood pressure.  I do not have other records.  Apparently, work-up otherwise negative and she  was sent home.  She does have a history of diabetes.  Did have nocturia as  well and urinary frequency which seems out of proportion to her blood sugars  since the last one last night was 126 after which she ran out of strips.  She has also been unable to keep p.o.'s down in the last eight to 12 hours  or so.  She is therefore to be admitted for further evaluation and  management.   PAST MEDICAL HISTORY:  1. Diabetes mellitus.  2. Hypertension.  3. Obesity.  4. Left knee DJD.  5. Spinal stenosis status post recent lumbar surgery.  6. Asthma.  7. GERD.  8. Allergies.  9. Hypercholesterolemia.  10.      Anxiety/depression.   ALLERGIES:  VICODIN.   CURRENT MEDICATIONS:  1. Prozac 20 mg p.o. daily.  2. Lasix 80 mg b.i.d.  3. Advair 250/50 one puff b.i.d.  4. Nexium 40 mg daily.  5. Micro K 40 mEq p.o. b.i.d.  6. Ibuprofen 800 p.r.n.  7. Zyrtec 10 mg daily.  8. Nasonex as directed daily.  9. Clonidine TTS patch 0.2  q.week.  10.      Vasotec 20 mg b.i.d.   PHYSICAL EXAMINATION:  GENERAL:  The patient is a 65 year old black female.  She appears moderately ill and tearful and flushed.  VITAL SIGNS:  Blood pressure 120/70, respirations 20, pulse 80, temperature  103, weight 238.  HEENT:  Sclerae clear.  TMs with bilateral mild erythema.  Pharynx with  moderate erythema.  NECK:  Without lymphadenopathy, JVD, thyromegaly.  CHEST:  No rales, wheezing.  CARDIAC:  Regular rate and rhythm.  ABDOMEN:  Soft, nontender.  Positive bowel sounds.  No organomegaly.  No  masses.  EXTREMITIES:  No edema.  NEUROLOGIC:  Mood is somewhat tearful.   ASSESSMENT/PLAN:  1. Fever with nausea and vomiting and urinary  frequency.  Unclear exact     diagnosis by history and physical she is to be admitted, given     intravenous fluids as well as Phenergan p.r.n. nausea.  She is also given     empiric Rocephin intravenous as well as will check routine blood     cultures, laboratories, and further evaluation and treatment pending     results.  2. Diabetes mellitus.  Will check CBGs with sliding scale insulin.  3. Other medical problems as above.  Continue home medications except hold     Lasix at this time and micro K.                                               Corwin Levins, M.D. [facility]    JWJ/MEDQ  D:  06/07/2002  T:  06/07/2002  Job:  [id number]

## 2010-09-14 NOTE — Op Note (Signed)
NAME:  Denise [name], Denise [name] NO.:  [id number]   MEDICAL RECORD NO.:  [id number]                   PATIENT TYPE:  INP   LOCATION:  3172                                 FACILITY:  [facility]   PHYSICIAN:  Donalee Citrin, M.D.                     DATE OF BIRTH:  Sep 09, 1945   DATE OF PROCEDURE:  04/06/2002  DATE OF DISCHARGE:                                 OPERATIVE REPORT   PREOPERATIVE DIAGNOSIS:  Severe degenerative disk disease and lumbar spinal  stenosis at L4-5 and L5-S1 with disk mechanical instability and left-sided  L5-S1 radiculopathy.   POSTOPERATIVE DIAGNOSIS:  Severe degenerative disk disease and lumbar spinal  stenosis at L4-5 and L5-S1 with disk mechanical instability and left-sided  L5-S1 radiculopathy.   OPERATION PERFORMED:  Decompressive laminectomy at L4-5, L5-S1.  Posterior  lumbar interbody fusion using 12 x 24 mm tangent allograft wedges at L4-5  and 10 x 24 mm tangent allograft wedges at L5-S1, pedicle screw fixation L4  to S1 using 5.5 x 40 pedicle screws at L4, 5.5 x 35 at L5 and 6.5 x 35 on  the left at S1 and 6.5 x 30 on the right at S1.  Posterolateral arthrodesis  using locally harvested autograft and placement of a medium Hemovac drain.   SURGEON:  Donalee Citrin, M.D.   ASSISTANT:  Kathaleen Maser. Pool, M.D.   ANESTHESIA:  General endotracheal.   INDICATIONS FOR PROCEDURE:  The patient is a very pleasant 65 year old  female who has had longstanding back and left leg pain radiating down to the  top of the foot and the outside of her foot that has been refractory to  conservative treatment with anti-inflammatory, physical therapy and epidural  steroid injections.  Preoperative imaging showed severe spinal stenosis at  L4-5 and L5-S1.  Discography confirmed concordant pain response three  injections of the disk at L4-5 and L5-S1 with discordant pain response at L3-  4.  The patient was recommended a decompression and stabilization procedure.  Discussed all the risks and benefits of surgery with her; she understood and  agreed to proceed.   DESCRIPTION OF PROCEDURE:  The patient was brought to the operating room  where she received general anesthesia.  She was turned prone on the Wilson  frame.  The back was prepped in the usual sterile fashion.  After  preoperative x-ray localized the L4 spinous process, the midline incision  was made after infiltration of 10 cc of lidocaine with epinephrine.  Bovie  electrocautery was used to dissect down through the subcutaneous tissues and  subperiosteal dissection was carried out to the lamina of L4, 5 and S1 as  well as L3 exposing the L3, 4, and 5 TP's and pedicles.  The __________  self-retaining retractor was placed and the laminectomy was begun using a  Leksell rongeur and 3-mm Kerrison punch.  Radical decompressive laminectomy  was completed at L4-5 and L5-S1  exposing both the L4-5 and S1 nerve roots  radically out their foramina.  After all the neural foramina were  decompressed there was noted to be severe spinal stenosis and the L4-5 facet  was found to be markedly diastased with degenerative synovium.  In addition,  this was also noted at L5-S1.  The thecal sac and nerve roots were  adequately decompressed and pains were taken at coagulating the epidural  space at L4-5.  Then the annulotomy was made and the disk was adequately  cleaned out at L4-5 on the right.  The 11-mm distractor was inserted.  X-ray  confirmed those to still leave a halo around the distractor and it was  decided that 12 x 24 mm tangent allografts were needed at this side.  So the  disk space was cleaned on the left.  A 12 distractor was inserted.  Then on  the right side, using a size 12 cutter and chisel , a 12 x 24 mm tangent  allograft was inserted. Depth checked and confirmed with fluoroscopy along  the way.  Then on the left side, the disk space was adequately cleaned out  again using a size 12 cutter and  chisel.  The central annulus was scraped  out and several large fragments of disk were removed in the central  interspace and then locally harvested autograft was packed against the  allograft on the right side and then 12 x 24 tangent graft was inserted on  the patient's left side.  Both allografts noted to be in good position,  approximately 2 mm deep to the posterior alignment and confirmed by x-ray.  Then attention was taken down to the L5-S1 disk space.  Epidural veins  coagulated.  Annulotomy was made on the right side and a 10 distractor was  inserted and disk retracted down the left side and a size 10 cutter and  chisel were used to prepare this interspace and a 10 x 24 mm tangent  allograft was inserted on the left side.  Then this procedure was repeated  on the right with locally harvested allograft packed against the allograft  on the left side and a 10 x 24 mm tangent allograft inserted on the right  side.  Both disks were noted to be markedly degenerated __________  central  disk space and after all four tangent allografts were inserted and depth  checked and position confirmed with fluoroscopy.  Attention was taken to the  pedicles placement.  Using high speed drill, pilot holes were drilled at the  L4 pedicle on the left side, cannulated with the awl, probed, tapped with a  4-5 tap and a 5.5 x 4 screw inserted on the left at L4.  This procedure was  repeated at L5 with a 5.5 x 35 screws and a 6.5 x 35 inserted at S1 on the  left.  All screws were found to be in good position.  All pedicles were  noted to be competent __________ rotation direct __________ .  Inspection  also confirmed no violation of the medial aspect of the pedicle.  This  procedure was repeated on the right, putting 5.5 x 40 at L4 on the right,  5.5 x 35 at L5 on the right and a 6.5 x 30 at S1 on the right.  Again all  neural foramina were re-explored and noted no further stenosis.  Then the wound was  copiously irrigated and aggressive decortication was carried out  in TP's and lateral gutters  on both sides __________ .  Locally harvested  allograft was packed along the lateral gutter and TP's.  Then the rods were  size-selected and inserted, tapped in and straight down at S1.  Then L5 was  compressed against S1 and L4 compressed against L5.  Cross-link was applied  in the rub between L5 and S1 and all screws and cross-link nuts were  tightened down.  Construct noted to be solid, was confirmed.  Postoperative  x-ray confirmed good position of rods, screws and bone graft.  Then the  wound was copiously irrigated.  Medium Hemovac drain was placed.  Gelfoam  was overlaid atop the dura and the muscle and fascia were reapproximated  with 0 interrupted Vicryl and subcutaneous tissue closed with 2-0  interrupted Vicryl and the skin was closed with running 4-0 subcuticular.  Benzoin and Steri-Strips applied.  The patient was taken to the recovery  room in stable condition.                                                Donalee Citrin, M.D.    GC/MEDQ  D:  04/06/2002  T:  04/06/2002  Job:  [id number]

## 2010-09-14 NOTE — H&P (Signed)
NAME:  Denise [name], Denise [name]                    ACCOUNT NO.:  [id number]   MEDICAL RECORD NO.:  [id number]                   PATIENT TYPE:  INP   LOCATION:                                       FACILITY:  [facility]   PHYSICIAN:  Sean A. Everardo All, M.D. [facility]           DATE OF BIRTH:  Sep 29, 1945   DATE OF ADMISSION:  08/02/2002  DATE OF DISCHARGE:                                HISTORY & PHYSICAL   REASON FOR ADMISSION:  Chest pain.   HISTORY OF PRESENT ILLNESS:  The patient is a 65 year old woman with four  hours of moderate substernal chest pain described as a dull ache quality  with associated slight shortness of breath only with exertion.  This  resolved with nitroglycerin in the emergency department.  She is now  asymptomatic.   PAST MEDICAL HISTORY:  1. Hypertension.  2. Osteoarthritis of the spine.  3. Gastroesophageal reflux disease.  4. Questionable history of type 2 diabetes mellitus.  5. Asthma.  6. Allergic rhinitis.  7. Depression.   MEDICATIONS:  1. Cardizem CD 240 mg daily.  2. Zestril 40 mg daily.  3. Catapres patch 3- change weekly.  4. Advair 100/50, one puff twice daily.  5. Lasix 40 mg daily.  6. Potassium chloride 40 mEq b.i.d.  7. Prilosec OTC 20 mg daily.  8. Motrin 800 mg 4X daily as needed for pain.  9. Prozac 40 mg daily.  10.      Neurontin 300 mg t.i.d.  11.      Zyrtec 10 mg daily.   SOCIAL HISTORY:  The patient is divorced.  She is here with her son.  She  works as a Designer, jewellery.   FAMILY HISTORY:  Negative for heart disease.   REVIEW OF SYMPTOMS:  Denies the following:  loss of consciousness, excessive  diaphoresis, fever, chills, dysuria, rectal bleeding, hematuria, headache,  rash, nausea and vomiting.   PHYSICAL EXAMINATION:  VITAL SIGNS:  Blood pressure 103/42.  Heart rate is  35, respiratory rate is 20.  Patient is afebrile.  GENERAL:  No distress.  Patient is obese.  SKIN:  Not diaphoretic.  HEENT:  Head is atraumatic.  Sclerae  is nonicteric.  Pharynx clear.  NECK: Supple.  CHEST:  Clear to auscultation.  CARDIOVASCULAR:  No jugular venous distention and no edema.  Regular rate  and rhythm and no murmurs.  EXTREMITIES:  Pedal pulses are intact. No obvious deformities of the joints.  BREASTS/GYN/RECTAL:  Examinations not done at this time due to the patient's  condition.  NEUROLOGICAL:  Alert, well oriented.  Cranial nerves II-XII appear to be  intact.  The patient moves all fours.   LABORATORY DATA:  Electrocardiogram shows marked sinus bradycardia, first  degree AV block and nonspecific ST changes.   IMPRESSION:  1. Chest pain of uncertain etiology.  2. Marked bradycardia.  3. Other medical problems as noted.  4. Borderline hypotension.   PLAN:  1.  Admit to telemetry.  2. Discontinue Cardizem, Zestril, Catapres, Lasix and potassium chloride.  3. Check CPK's.  4. Symptomatic therapy.  5. I discussed code status with the patient.  The patient states she wants     full code but would not want to be started nor maintained on artificial     life support systems if there was not a reasonable chance of a functional     recovery.                                               Sean A. Everardo All, M.D. [facility]    SAE/MEDQ  D:  08/02/2002  T:  08/02/2002  Job:  [id number]

## 2010-09-14 NOTE — Discharge Summary (Signed)
NAME:  Denise [name], Denise [name] NO.:  [id number]   MEDICAL RECORD NO.:  [id number]                   PATIENT TYPE:  INP   LOCATION:  2019                                 FACILITY:  [facility]   PHYSICIAN:  Rene Paci, M.D. [facility]          DATE OF BIRTH:  May 20, 1945   DATE OF ADMISSION:  08/02/2002  DATE OF DISCHARGE:  08/03/2002                                 DISCHARGE SUMMARY   DISCHARGE DIAGNOSES:  1. Chest pain.  2. Myocardial infarction ruled out.   BRIEF ADMISSION HISTORY:  The patient is a 65 year old female without prior  coronary disease.  She has had poorly controlled hypertension and over the  past several months had had several changes in her medications.  Most  recently her clonidine was increased.  Prior to this admission she noted  bradycardia prompting a decrease in her Cardizem dose.  The patient  presented with complaints of a dull substernal chest discomfort with  associated mild shortness of breath and dizziness.  She presented for  further evaluation.   PAST MEDICAL HISTORY:  1. Asthma.  2. Gastroesophageal reflux disease.  3. Hypertension.  4. Adult-onset diabetes mellitus.  5. Allergic rhinitis.  6. Depression.  7. Juvenile rheumatoid arthritis.  8. Status post a total thyroidectomy in 1986.  9. Status post lumbar spinal fusion in December 2003.   HOSPITAL COURSE:  CARDIOVASCULAR:  The patient was admitted with chest pain  with cardiac risk factors including hypertension and diabetes.  She was  admitted to rule out myocardial infarction.  The patient was seen in  consultation by cardiology.  They did recommend exercise Cardiolite for risk  stratification.  The patient's Cardiolite was negative for ischemia.  The  patient had had some bradycardia as well and cardiology recommended  discontinuing her calcium channel blocker.  We did note that her TSH was  normal.  In regards to her hypertension, we also decreased her clonidine  patch.   DISCHARGE LABORATORIES:  Hemoglobin 12.3.  Coags normal.  CMET was  essentially normal.  Total cholesterol 209, triglycerides 163, HDL 50, LDL  126.  TSH 2.252.   DISCHARGE MEDICATIONS:  1. Advair 100/50 one puff b.i.d.  2. Zyrtec 10 mg daily.  3. Prozac 40 mg daily.  4. Prilosec 20 mg daily.  5. Neurontin 300 mg b.i.d.  6. Zestril 40 mg daily.  7. Lasix 40 mg daily.  8. Potassium chloride 40 mEq daily.  9. ______ as needed.  10.      Catapres 0.2 mg for 24 hour patch, may change every week.  11.      Lipitor 40 mg daily.  12.      She was also instructed not to take Cardizem.   FOLLOW UP:  With Corwin Levins, M.D. [facility] in two weeks.     Cornell Barman, P.A. [facility]                  Rene Paci,  M.D. [facility]    LC/MEDQ  D:  09/02/2002  T:  09/03/2002  Job:  [id number]   cc:   Corwin Levins, M.D. [facility]   Salvadore Farber, M.D.

## 2010-09-14 NOTE — Discharge Summary (Signed)
NAME:  Denise [name], Denise [name]                    ACCOUNT NO.:  [id number]   MEDICAL RECORD NO.:  [id number]                   PATIENT TYPE:  INP   LOCATION:  0450                                 FACILITY:  [facility]   PHYSICIAN:  Rene Paci, M.D. [facility]          DATE OF BIRTH:  08-03-1945   DATE OF ADMISSION:  06/07/2002  DATE OF DISCHARGE:  06/18/2002                                 DISCHARGE SUMMARY   DISCHARGE DIAGNOSES:  1. Urinary tract infection secondary to methicillin-resistant Staphylococcus     aureus, improved.  2. Abdominal pain, cramping with diarrhea secondary to irritable bowel     syndrome.  Gastrointestinal work-up otherwise negative.  3. Type 2 diabetes.  4. Hypertension.  5. Status post recent lumbar fusion surgery secondary to spinal stenosis in     December 2003 with chronic back pain.  6. Obesity.  7. Degenerative joint disease.  8. Asthma.  9. Gastroesophageal reflux disease.  10.      Seasonal allergies.  11.      Anxiety/depression.   DISCHARGE MEDICATIONS:  1. Percocet 5/325 one to two tablets q.6h. as needed for pain.  2. Robinul 2 mg p.o. b.i.d. p.r.n. abdominal cramping.  3. Prozac 20 mg p.o. daily.  4. Advair 250/50 p.o. b.i.d.  5. Nexium 40 mg p.o. daily.  6. Zyrtec 10 mg p.o. daily.  7. Flonase nasal spray.  8. Vasotec 20 mg p.o. b.i.d.  9. Catapres-TTS-2 q. week.  10.      Valium 5 mg p.o. q.6h. p.r.n.  11.      Cardizem CD 300 mg p.o. daily.  12.      K-Dur 40 mEq p.o. b.i.d.  13.      Neurontin 300 mg p.o. q.h.s.  14.      Lasix 40 mg p.o. b.i.d.  15.      Macrobid 100 mg p.o. b.i.d. for additional seven days for 10 day     total treatment.   DISPOSITION:  The patient is discharged to home in medically stable and  improved condition.   CONDITION ON DISCHARGE:  Good.   FOLLOW UP:  Scheduled with her primary care physician, Corwin Levins, M.D.  [facility] to be arranged in the next week to follow up on  patient's  substance complaints.  She will also resume her outpatient  rehabilitation physical therapy for back pain and will be seen by the foot  clinic for evaluation and nail trimming next week at [facility].   HOSPITAL COURSE:  Problem 1 - ABDOMINAL PAIN, CRAMPING, AND DIARRHEA:  The  patient was admitted for further evaluation of these complaints along with  associated fever and chills.  Laboratory work-up including LFTs were  unremarkable and patient was initially kept on bowel rest and given pain  control with Demerol and IV fluids.  After diet advanced she continued to  complain of abdominal cramping and pain associated with diarrhea post meals  and she was evaluated by GI.  She underwent an upper endoscopy which was  negative for any pathology and then an abdominal ultrasound which also  showed fatty liver changes, but no other abnormalities.  It was not felt  that this was due to any gallbladder hepatic disease or pancreatic  malfunction as all laboratory work was within normal limits.  Therefore, she  was diagnosed with IBS and initiated on Robinul.  Her nausea and cramping  are improved at time of discharge and she is requiring only as needed  Percocet for this pain as well as her back pain.  Please see problem number  next.   Problem 2 - URINARY TRACT INFECTION:  The patient was complaining of  frequency and had low grade fevers during this hospitalization.  She was  initially started on Cipro and Flagyl empirically for presumed  diverticulitis related to problems above and treated with seven days of  this.  However, because her fevers continued, her urine culture was  reexamined and this time found to have 40,000 colonies of MRSA.  This was  sensitive to Macrodantin and she was initiated on a 10 day treatment course  of this.  Her fevers have resolved as have her symptoms of polyuria at time  of discharge.  She has had concerns about a kidney infection but she has  been reassured  numerous times that she is being adequately treated for her  bladder infection.   Problem 3 - LABILE HYPERTENSION:  The patient's blood pressure varied  greatly during this hospitalization and she was increased to Vasotec b.i.d.  from her previous daily and added a Catapres patch.  Her Lasix was mildly  reduced from her initial dosing and this will be continually evaluated on  outpatient basis.   Problem 4 -       TYPE 2 DIABETES:  The patient at home was on no  medications and is presumably diet controlled.  She was on sliding scale  during this hospitalization and CBGs remained under 200s.   Problem 5 - STATUS POST L4-5-S1 FUSION DECEMBER 2003.  The patient will  continue her outpatient rehabilitation therapy and pain management as  needed.  Follow up with Payton Doughty, M.D. as scheduled.                                               Rene Paci, M.D. [facility]    VL/MEDQ  D:  06/18/2002  T:  06/18/2002  Job:  [id number]

## 2010-09-14 NOTE — Consult Note (Signed)
NAME:  Denise [name], Denise [name] NO.:  [id number]   MEDICAL RECORD NO.:  [id number]                   PATIENT TYPE:  INP   LOCATION:  0450                                 FACILITY:  [facility]   PHYSICIAN:  Iva Boop, M.D. [facility]           DATE OF BIRTH:  12/25/1945   DATE OF CONSULTATION:  06/16/2002  DATE OF DISCHARGE:                                   CONSULTATION   REFERRING PHYSICIAN:  Rene Paci, M.D.   PRIMARY CARE PHYSICIAN:  Corwin Levins, M.D.   REASON FOR CONSULTATION:  Epigastric pain.   HISTORY OF PRESENT ILLNESS:  This is a 65 year old African-American woman  with history of lumbar disk surgery on April 06, 2002.  She did well.  She  had PT postoperatively and she was subsequently admitted here on February 9  with fever to 103, nausea and vomiting, abdominal cramps, diarrhea and  urinary frequency.  She had stool studies, urinalysis, blood cultures, all  unrevealing.  She was empirically treated with Cipro and Flagyl.  The  diarrhea resolved.  Approximately 36 hours ago she developed severe  epigastric pain without nausea and vomiting.  She has had ongoing low grade  fevers.  There were no recent nonsteroidals.  She takes a chronic proton  pump inhibitor for a diagnosis of gastroesophageal reflux disease.  Colonoscopy by Dr. Claudette Head in August 2003 showed hemorrhoids.  The  patient has had some blood per rectum and also had family history of polyps.  At this point she says the pain is constant and sharp.  There may be some  radiation into the back, into the interscapular area.  She denies having  problems like this before.  There is no dysphagia or odynophagia.  She has  not vomited lately according to her.  Again the diarrhea is gone, lower  abdominal pain is resolved.  She had a CT scan of the abdomen and pelvis on  February 10 that showed probable uterine fibroids and a mobile high  transverse cecum, but was otherwise unrevealing.   She said that pain is  worse when she eats.  Demerol was being used with some relief of the pain at  this point.   PAST MEDICAL HISTORY:  1. Morbid obesity.  2. Diabetes mellitus type 2.  3. Spinal stenosis with lumbar surgery December 2003.  4. Gastroesophageal reflux disease.  5. Dyslipidemia.  6. Depression.  7. Anxiety.  8. Degenerative joint disease.  9. Status post subtotal thyroidectomy.  10.      Asthma.  11.      History of labile blood pressure.   ALLERGIES:  VICODIN.   HOME MEDICATIONS:  1. Prozac 20 mg daily.  2. Lasix 80 mg b.i.d.  3. Advair 250/50 one puff b.i.d.  4. Micro-K 40 mEq b.i.d.  5. Ibuprofen 800 mg p.r.n.  6. Zyrtec 10 mg daily.  7. Nasonex daily as directed.  8. Clonidine PPS patch 14 mg  every week.  9. Vasotec 20 mg b.i.d.   HOSPITAL MEDICATIONS:  1. Prozac 20 mg daily.  2. Advair.  3. Protonix 40 mg daily.  4. Claritin 10 mg daily instead of Zyrtec.  5. Flonase substituted for Nasonex.  6. Vasotec 20 mg daily.  7. She is on insulin.  8. Clonidine.  9. Valium 5 mg q.6h.  10.      Subcutaneous heparin.  11.      Cipro and Flagyl which is now p.o. 250 mg and Cipro 500 mg and     Flagyl b.i.d. and then t.i.d. respectively.  12.      Neurontin 300 mg q.h.s.  13.      Senokot p.r.n. constipation.  14.      Furosemide 40 mg b.i.d.  15.      She is also on Percocet, Demerol and Phenergan p.r.n.  16.      She was given Dilaudid yesterday x 1.   SOCIAL HISTORY:  She is single.  She has two adult children.  No alcohol or  tobacco.  She lives in [facility] [facility] [place] [facility].   FAMILY HISTORY:  Positive for colon polyps.   REVIEW OF SYSTEMS:  Notable for fatigue and weakness.  There is no history  of bleeding diaphysis. She is having some mid back pain.  All other review  of systems negative.   PHYSICAL EXAMINATION:  GENERAL:  Obese, black woman, lying in bed in no  acute distress.  VITAL SIGNS:  Temperature 98.5, pulse 78, respirations 20, blood pressure   147/73.  Her last elevated temperature was 100.1 on February 16 in the  evening shift.  HEENT:  Eyes are anicteric.  Pupils round and reactive to light.  Mouth and  posterior pharynx free of mucosal lesions.  NECK:  Supple without mass or obvious thyromegaly.  CHEST:  Clear anteriorly.  HEART:  S1 and S2.  I hear no rubs, murmurs or gallops.  ABDOMEN:  Obese.  There is moderate epigastric tenderness with some mild  guarding.  It does not appear that she has chest wall tenderness or xiphoid  tenderness.  Previous stool hemoccults are negative.  RECTAL:  Not done at this time.  NODES:  I detect no neck or subclavicular nodes.  EXTREMITIES:  She has trace pedal edema.  NEUROLOGIC:  Alert and oriented x 3.  PSYCHIATRIC:  She appears slightly anxious.  SKIN:  I see no rash.   LABORATORY DATA:  Hemoglobin 12.2, hematocrit 36.9, white count 8.5,  platelets 375, MCV 7.9.  BMET looks normal on February 17.  Glucose 121,  amylase 105, lipase 45.  LFTs normal.  She has had a minor elevation in her  SGOT at 39.  Urine culture shows 45,000 colonies of Staphylococcus aureus on  February 15.  Clostridium difficile toxin and other stool studies were  negative.  MRI of the lumbar spine did not show diskitis.  It showed  postoperative changes.  CT scan of the abdomen showed fatty liver as well as  findings noted above.  Probable small adenoma of the adrenal gland.   ASSESSMENT:  1. Epigastric pain of uncertain etiology.  It could be musculoskeletal from     her vomiting.  She is on proton pump inhibitor chronically and makes     ulcer disease much less likely.  The clinical scenario is not     particularly compatible with cholelithiasis.  2. Fever.  This is resolved at this point it appears.  3. Diarrhea  resolved.  4. Recent lumbar spine surgery.  I do not think it is related to the current     problem.   PLAN:  1. An EGD later today. 2. If that is unrevealing, will consider ultrasound versus  HIDA scan of the     gallbladder if her symptoms persist.  It is possible she could have     cholecystitis though again the history and physical findings do not     really obviously support that.  I appreciate the opportunity to care for this patient.                                                  Iva Boop, M.D. [facility]    CEG/MEDQ  D:  06/16/2002  T:  06/16/2002  Job:  [id number]   cc:   Corwin Levins, M.D. [facility]

## 2010-09-14 NOTE — Discharge Summary (Signed)
   NAME:  Denise [name], Denise [name] NO.:  [id number]   MEDICAL RECORD NO.:  [id number]                   PATIENT TYPE:  INP   LOCATION:  3015                                 FACILITY:  [facility]   PHYSICIAN:  Donalee Citrin, M.D.                     DATE OF BIRTH:  Jun 16, 1945   DATE OF ADMISSION:  04/06/2002  DATE OF DISCHARGE:  04/12/2002                                 DISCHARGE SUMMARY   ADMITTING DIAGNOSIS:  Severe degenerative disk disease and ruptured disks at  L4-L5 and L5-S1.   PROCEDURE DURING THIS HOSPITALIZATION:  Decompressive laminectomy and  posterior lumbar interbody fusion procedure, L4 to S1.   SURGEON:  Donalee Citrin, M.D.   ASSISTANT:  Sherilyn Cooter A. Pool, M.D.   ANESTHESIA:  General endotracheal.   HOSPITAL COURSE:  The patient was admitted as an a.m. admit and underwent  the aforementioned procedure, postop did very well and went to the room.  On  the floor on the first day, was doing very well, had a low grade fever of  100.4, other vital signs were stable.  Legs had a significant improvement in  pain, however, was having a lot of back pain.  Her oxygen saturations were  93% on room air.  She was maintained on O2 per nasal cannula.  Physical and  occupational therapy were consulted.  They evaluated her on the first day.  Her Foley was able to be taken out on day 2, she continued to mobilize with  therapy, having a fair amount of back pain as well as some difficulty with  her bowels.  Her activity progressively mobilized over the next several days  and finally by the 15th, which was hospital day 6, the patient was stable to  be discharged home so she was discharged with followup in two weeks on  oxycodone for pain and Valium as a muscle relaxer.  The patient was  discharged with home health physical therapy and occupational therapy  through [facility].                                               Donalee Citrin, M.D.    GC/MEDQ  D:  06/03/2002   T:  06/03/2002  Job:  [id number]

## 2010-09-14 NOTE — Consult Note (Signed)
NAME:  Denise [name], Denise [name] NO.:  [id number]   MEDICAL RECORD NO.:  [id number]                   PATIENT TYPE:  INP   LOCATION:  2019                                 FACILITY:  [facility]   PHYSICIAN:  Salvadore Farber, M.D. [facility]         DATE OF BIRTH:  [birth year]   DATE OF CONSULTATION:  08/02/2002  DATE OF DISCHARGE:                                   CONSULTATION   REQUESTING PHYSICIAN:  Dr. Gregary Signs A. Everardo All.   CHIEF COMPLAINT:  I had chest discomfort and a low heart rate.   HISTORY OF PRESENT ILLNESS:  The patient is a 65 year old lady without prior  history of cardiovascular disease.  She has risk factors of diabetes  mellitus and hypertension.  Her blood pressure has been poorly controlled  over the past several months, resulting in multiple changes in her  medications, most recently an increase in clonidine.  Beginning late last  week, she noted resting sinus bradycardia that was quite concerning to her.  That resulted in decrease in her Cardizem dose from 300 to 240 mg per day.   This morning, she awoke at 2:30 a.m. feeling fine.  Because she had  forgotten to do so before going to sleep, she checked her blood pressure and  blood glucose.  She noted her glucose to be normal but her pulse rate to be  45.  After noticing this, she states that she began to feel a dull  substernal chest discomfort associated with mild shortness of breath and  mild dizziness.  The pain was dull in nature and radiated to her mid-  thoracic spine.  It was 6/10 at its most severe.  There was no nausea,  vomiting, diaphoresis.  In the emergency room, she was given aspirin and  nitroglycerin with complete relief of her pain.  She has remained pain-free  since.   The patient denies any antecedent history of exertional chest discomfort,  resting chest discomfort, orthopnea, PND, edema, and claudication.  She  further denies any palpitations.   PAST MEDICAL HISTORY:  Asthma,  GERD, hypertension, diabetes mellitus x6  years, allergic rhinitis, depression, juvenile rheumatoid arthritis, status  post subtotal thyroidectomy in 1986, status post lumbar spinal fusion in  December 2003.   ALLERGIES:  No known drug allergies.   HOME MEDICATIONS:  1. Lasix 40 mg twice per day.  2. Zyrtec.  3. Prozac.  4. Cardizem 240 mg per day.  5. Neurontin.  6. Valium.  7. Lisinopril 40 mg per day.  8. Catapres 0.3 mg patch.  9. Prilosec.  10.      Advair.  11.      Ibuprofen.  12.      K-Dur 40 mEq twice per day.   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  She is divorced with two children.  She has minimal prior  history of tobacco use.  Denies  alcohol and drug use.   FAMILY HISTORY:  Father died in his 37s of questionable stomach problems.  Mother died of unclear causes.  Sister is alive in her 33s with a history of  an AAA.  Two brothers died prematurely, one of complications of cirrhosis.  The cause of death in the other is unclear.   REVIEW OF SYSTEMS:  Review of systems remarkable for numbness in her toes,  depression, heat intolerance.  Otherwise, negative in detail except as  above.   PHYSICAL EXAMINATION:  GENERAL:  On physical exam, this is a somnolent but  otherwise well-appearing woman who appears sluggish but does respond to all  questions.  VITAL SIGNS:  She has a heart rate of 46, blood pressure of 122/72 and is  afebrile.  NECK:  She has no jugular venous distention.  Her thyroid is normal in size  without nodule.  Carotid pulses are 2+ bilaterally without bruits.  LUNGS:  Lungs are clear to auscultation.  She has a regular rate and rhythm  without murmur, rub or gallop.  ABDOMEN:  Her abdomen is soft, nondistended and nontender.  There is no  hepatosplenomegaly.  Bowel sounds are normal.  EXTREMITIES:  Extremities are warm without clubbing, cyanosis, edema or  ulceration.   LABORATORY AND ACCESSORY  CLINICAL DATA:  Electrocardiogram demonstrates  sinus bradycardia at 45 beats per minute and is otherwise normal.   Laboratory studies are remarkable for hematocrit of 37, platelets 233,000,  sodium 138, potassium 3.9, creatinine 0.9, glucose 103, CK-MB 4.6, troponin  I less than 0.01, INR 1.0.   IMPRESSION/RECOMMENDATION:  1. Chest pain:  This is a 65 year old lady with diabetes mellitus and     hypertension, who presents with chest pain and bradycardia.  Chest pain     lasted approximately one hour.  Initial enzymes are negative and     electrocardiogram is without evidence of clearcut ischemia.  We will plan     to rule out myocardial infarction.  If enzymes are negative, we will     obtain a __________ Cardiolite in the morning.  This should be helpful     both in excluding ischemia and demonstrating chronotropic incompetence.  2. Bradycardia:  Etiology of bradycardia is unclear.  No clear culprit     medications except the Cardizem, whose dose was recently decreased.  I     will further decrease this today.  We will check thyroid-stimulating     hormone and assess chronotropic incompetence on exercise test.  3. Hypertension:  Blood pressure is normal, though requires substantial     doses of four medications to achieve this.  Renal function is normal.     Concern is raised for renal artery stenosis; this could be further     assessed as an outpatient.  4. Murmur of mitral regurgitation:  Suggest echocardiogram in the near     future, perhaps as an outpatient.                                               Salvadore Farber, M.D. [facility]    WED/MEDQ  D:  08/02/2002  T:  08/03/2002  Job:  [id number]   cc:   Gregary Signs A. Everardo All, M.D. [facility]   Corwin Levins, M.D. [facility]

## 2010-09-17 ENCOUNTER — Ambulatory Visit: Payer: Self-pay | Admitting: Internal Medicine

## 2010-10-04 ENCOUNTER — Ambulatory Visit (HOSPITAL_COMMUNITY)
Admission: RE | Admit: 2010-10-04 | Discharge: 2010-10-04 | Disposition: A | Discharge status: Home / Self Care | Payer: Medicare Other | Source: Ambulatory Visit | Attending: Orthopedic Surgery | Admitting: Orthopedic Surgery

## 2010-10-04 ENCOUNTER — Other Ambulatory Visit: Payer: Self-pay | Admitting: Orthopedic Surgery

## 2010-10-04 ENCOUNTER — Other Ambulatory Visit (HOSPITAL_COMMUNITY): Payer: Self-pay | Admitting: Orthopedic Surgery

## 2010-10-04 ENCOUNTER — Encounter (HOSPITAL_COMMUNITY): Payer: Medicare Other

## 2010-10-04 DIAGNOSIS — Z01810 Encounter for preprocedural cardiovascular examination: Secondary | ICD-10-CM | POA: Insufficient documentation

## 2010-10-04 DIAGNOSIS — M179 Osteoarthritis of knee, unspecified: Secondary | ICD-10-CM

## 2010-10-04 DIAGNOSIS — J45909 Unspecified asthma, uncomplicated: Secondary | ICD-10-CM | POA: Insufficient documentation

## 2010-10-04 DIAGNOSIS — Z01818 Encounter for other preprocedural examination: Secondary | ICD-10-CM | POA: Insufficient documentation

## 2010-10-04 DIAGNOSIS — I10 Essential (primary) hypertension: Secondary | ICD-10-CM | POA: Insufficient documentation

## 2010-10-04 DIAGNOSIS — M1710 Unilateral primary osteoarthritis, unspecified knee: Secondary | ICD-10-CM

## 2010-10-04 DIAGNOSIS — F17200 Nicotine dependence, unspecified, uncomplicated: Secondary | ICD-10-CM | POA: Insufficient documentation

## 2010-10-04 DIAGNOSIS — Z01811 Encounter for preprocedural respiratory examination: Secondary | ICD-10-CM | POA: Insufficient documentation

## 2010-10-04 DIAGNOSIS — R9431 Abnormal electrocardiogram [ECG] [EKG]: Secondary | ICD-10-CM | POA: Insufficient documentation

## 2010-10-04 DIAGNOSIS — Z01812 Encounter for preprocedural laboratory examination: Secondary | ICD-10-CM | POA: Insufficient documentation

## 2010-10-04 LAB — BASIC METABOLIC PANEL
BUN: 11 mg/dL (ref 6–23)
Calcium: 9.6 mg/dL (ref 8.4–10.5)
Creatinine, Ser: 0.65 mg/dL (ref 0.4–1.2)
GFR calc non Af Amer: >60 mL/min (ref >60)
Potassium: 3.6 mEq/L (ref 3.5–5.1)

## 2010-10-04 LAB — APTT: aPTT: 30 seconds (ref 24–37)

## 2010-10-04 LAB — URINALYSIS, ROUTINE W REFLEX MICROSCOPIC
Bilirubin Urine: NEGATIVE
Ketones, ur: NEGATIVE mg/dL
Nitrite: NEGATIVE
Specific Gravity, Urine: 1.012 (ref 1.005–1.030)
Urobilinogen, UA: 0.2 mg/dL (ref 0.0–1.0)

## 2010-10-04 LAB — DIFFERENTIAL
Basophils Absolute: 0.1 10*3/uL (ref 0.0–0.1)
Basophils Relative: 1 % (ref 0–1)
Monocytes Relative: 6 % (ref 3–12)
Neutro Abs: 6 10*3/uL (ref 1.7–7.7)
Neutrophils Relative %: 53 % (ref 43–77)

## 2010-10-04 LAB — CBC
Hemoglobin: 13.3 g/dL (ref 12.0–15.0)
MCHC: 32.4 g/dL (ref 30.0–36.0)
Platelets: 258 10*3/uL (ref 150–400)
RBC: 5.04 MIL/uL (ref 3.87–5.11)

## 2010-10-04 LAB — PROTIME-INR
INR: 0.96 (ref 0.00–1.49)
Prothrombin Time: 13 seconds (ref 11.6–15.2)

## 2010-10-04 LAB — SURGICAL PCR SCREEN: Staphylococcus aureus: POSITIVE — AB

## 2010-10-09 ENCOUNTER — Inpatient Hospital Stay (HOSPITAL_COMMUNITY)
Admission: RE | Admit: 2010-10-09 | Discharge: 2010-10-12 | DRG: 470 | Disposition: A | Discharge status: Home Health | Payer: Medicare Other | Source: Ambulatory Visit | Attending: Orthopedic Surgery | Admitting: Orthopedic Surgery

## 2010-10-09 DIAGNOSIS — Z981 Arthrodesis status: Secondary | ICD-10-CM

## 2010-10-09 DIAGNOSIS — Z01812 Encounter for preprocedural laboratory examination: Secondary | ICD-10-CM

## 2010-10-09 DIAGNOSIS — E669 Obesity, unspecified: Secondary | ICD-10-CM | POA: Diagnosis present

## 2010-10-09 DIAGNOSIS — M1710 Unilateral primary osteoarthritis, unspecified knee: Principal | ICD-10-CM | POA: Diagnosis present

## 2010-10-09 DIAGNOSIS — I10 Essential (primary) hypertension: Secondary | ICD-10-CM | POA: Diagnosis present

## 2010-10-09 DIAGNOSIS — E119 Type 2 diabetes mellitus without complications: Secondary | ICD-10-CM | POA: Diagnosis present

## 2010-10-09 DIAGNOSIS — J45909 Unspecified asthma, uncomplicated: Secondary | ICD-10-CM | POA: Diagnosis present

## 2010-10-09 LAB — GLUCOSE, CAPILLARY: Glucose-Capillary: 170 mg/dL — ABNORMAL HIGH (ref 70–99)

## 2010-10-09 LAB — TYPE AND SCREEN

## 2010-10-09 NOTE — H&P (Signed)
NAME:  Denise [name], Denise [name] NO.:  [id number]  MEDICAL RECORD NO.:  [id number]  LOCATION:                                 FACILITY:  PHYSICIAN:  Denise Frankel. Charlann [name], M.D.  DATE OF BIRTH:  May 23, 1945  DATE OF ADMISSION: DATE OF DISCHARGE:                             HISTORY & PHYSICAL   ADMITTING DIAGNOSIS:  Left knee osteoarthritis.  HISTORY OF PRESENT ILLNESS:  This is a 65 year old lady with a history of bilateral knee osteoarthritis, left greater than right for years. She has had also problems with her back.  She has now had her back fused, is doing well with that, and is ready to have her knees replaced starting with the left one.  The surgery risks, benefits, and aftercare were discussed with the patient.  Questions invited and answered.  Note, she is a candidate for transatlantic acid and will receive that per protocol at surgery, and also is given her home medications including aspirin 325 b.i.d. for DVT prophylaxis, Robaxin 500 q.6 p.r.n. spasm, iron, MiraLax, and Colace.  She will be going home after surgery.  PAST MEDICAL HISTORY:  Drug allergies to ERYTHROMYCIN and CODEINE.  CURRENT MEDICATIONS: 1. Lisinopril 40 mg a day. 2. Glimepiride 2 mg a day. 3. Metformin ER 500 mg 4 tablets daily. 4. Potassium chloride 10 mEq 4 tablets daily. 5. Lasix 40 mg 1 tablet b.i.d. 6. Ibuprofen 800 mg q.i.d. 7. Norvasc 10 mg daily. 8. Zyrtec 10 mg daily. 9. Aspirin 81 mg daily. 10.Advair 100/50 b.i.d. 11.Vitamin D. 12.Multivitamin. 13.Fish oil.  MEDICAL ILLNESSES:  Hypertension, diabetes, and asthma.  PREVIOUS SURGERIES:  Spinal fusion surgery and subtotal thyroidectomy.  SOCIAL HISTORY:  The patient is single.  She is a retired Designer, jewellery.  She does not smoke and drinks rarely.  FAMILY HISTORY:  Positive for hypertension, heart disease, and diabetes.  REVIEW OF SYSTEMS:  CENTRAL NERVOUS SYSTEM:  Negative for headache, blurred vision, or dizziness.   PULMONARY:  Positive for asthma and exertional shortness of breath.  CARDIOVASCULAR:  Positive for hypertension.  GI:  Negative for ulcers, hepatitis.  GU:  Negative for urinary tract difficulty.  MUSCULOSKELETAL:  Positive in HPI.  PHYSICAL EXAMINATION:  VITAL SIGNS:  BP 160/98, respirations 16, pulse 72 and regular. GENERAL APPEARANCE:  Well-developed, well-nourished lady in no acute distress. HEENT:  Head normocephalic.  Nose patent.  Ears patent.  Pupils are equal and reactive to light.  Throat without injection. NECK:  Supple without adenopathy.  Carotids 2+ without bruit. CHEST:  Clear to auscultation.  No rales or rhonchi.  Respirations 16. HEART:  Regular rate and rhythm at 72 beats per minute without murmur. ABDOMEN:  Soft.  Active bowel sounds.  No masses, organomegaly. NEUROLOGIC:  The patient is alert and oriented to time, place, and person.  Cranial nerves II through XII grossly intact. EXTREMITIES:  The left knee with 2-degree flexion contracture, further flexion to 100 degrees.  Neurovascular status intact. IMPRESSION:  Left knee osteoarthritis.  PLAN:  Left total knee arthroplasty.     Denise [name], P.A.   ______________________________ Denise [name], M.D.    SJC/MEDQ  D:  10/03/2010  T:  10/04/2010  Job:  [id number]  Electronically  Signed by Denise Nam P.A. on 10/08/2010 02:10:10 PM Electronically Signed by Denise Romans M.D. on 10/09/2010 04:28:09 PM

## 2010-10-10 LAB — GLUCOSE, CAPILLARY
Glucose-Capillary: 114 mg/dL — ABNORMAL HIGH (ref 70–99)
Glucose-Capillary: 128 mg/dL — ABNORMAL HIGH (ref 70–99)
Glucose-Capillary: 139 mg/dL — ABNORMAL HIGH (ref 70–99)
Glucose-Capillary: 111 mg/dL — ABNORMAL HIGH (ref 70–99)

## 2010-10-10 LAB — BASIC METABOLIC PANEL
CO2: 26 mEq/L (ref 19–32)
Chloride: 104 mEq/L (ref 96–112)
GFR calc Af Amer: >60 mL/min (ref >60)
Potassium: 3.9 mEq/L (ref 3.5–5.1)

## 2010-10-10 LAB — CBC
Platelets: 208 10*3/uL (ref 150–400)
RBC: 4.31 MIL/uL (ref 3.87–5.11)
RDW: 15 % (ref 11.5–15.5)
WBC: 14.5 10*3/uL — ABNORMAL HIGH (ref 4.0–10.5)

## 2010-10-11 LAB — BASIC METABOLIC PANEL
BUN: 13 mg/dL (ref 6–23)
Calcium: 8.8 mg/dL (ref 8.4–10.5)
Creatinine, Ser: 0.8 mg/dL (ref 0.4–1.2)
GFR calc non Af Amer: >60 mL/min (ref >60)
Glucose, Bld: 139 mg/dL — ABNORMAL HIGH (ref 70–99)
Potassium: 3.8 mEq/L (ref 3.5–5.1)

## 2010-10-11 LAB — CBC
HCT: 29.2 % — ABNORMAL LOW (ref 36.0–46.0)
Hemoglobin: 9.6 g/dL — ABNORMAL LOW (ref 12.0–15.0)
MCH: 26.4 pg (ref 26.0–34.0)
MCHC: 32.9 g/dL (ref 30.0–36.0)
MCV: 80.4 fL (ref 78.0–100.0)

## 2010-10-11 LAB — GLUCOSE, CAPILLARY
Glucose-Capillary: 107 mg/dL — ABNORMAL HIGH (ref 70–99)
Glucose-Capillary: 149 mg/dL — ABNORMAL HIGH (ref 70–99)

## 2010-10-12 LAB — GLUCOSE, CAPILLARY

## 2010-10-16 NOTE — Discharge Summary (Signed)
NAMEMarland [name]  EMY, ANGEVINE NO.:  [id number]  MEDICAL RECORD NO.:  [id number]  LOCATION:  1609                         FACILITY:  [facility]  PHYSICIAN:  Madlyn Frankel. Charlann Boxer, M.D.  DATE OF BIRTH:  [birth year]  DATE OF ADMISSION:  10/09/2010 DATE OF DISCHARGE:                              DISCHARGE SUMMARY   DATE OF DISCHARGE:  Pending.  ADMITTING DIAGNOSES: 1. End-stage osteoarthritis, left knee. 2. Diabetes. 3. Obesity. 4. Hypertension. 5. Asthma.  DISCHARGE DIAGNOSES: 1. End-stage osteoarthritis, left knee. 2. Diabetes. 3. Obesity. 4. Hypertension. 5. Asthma.  OPERATIONS:  Left total knee arthroplasty.  HISTORY OF PRESENT ILLNESS:  This is a 65 year old lady with a history of end-stage osteoarthritis of her left knee with failure of conservative treatment to alleviate her symptoms.  After discussion of treatment benefits, risks, and options, the patient now scheduled for total knee arthroplasty.  LABORATORY VALUES:  Admission CBC showed WBC slightly high at 11.3, neutrophils absolute high at 4.2.  PT/PTT within normal limits.  BMET showed glucose high at 125.  Urinalysis was normal.  Her hemoglobin and hematocrit reached a low 9.6 and 29.2 on the 14th with a white count of 15.2.  BMET showed mildly elevated glucose through admission.  COURSE IN THE HOSPITAL:  The patient tolerated the operative procedure well.  First postoperative day, vital signs were stable.  She was afebrile.  She was started on physical therapy.  Wound dressing was dry. Second postoperative day, the patient had moderate pain, was having difficulty ambulating in therapy.  Vital signs were stable.  She was afebrile.  O2 96 on 2 liters.  Hemoglobin 6, hematocrit 29.2, sodium 134, glucose 139.  Lungs clear.  Heart sounds normal.  Bowel sounds sluggish.  Neurovascular status intact.  Feet and calves were negative. Dressing was dry and consult was obtained for skilled nursing  facility placement due to limited help at home.  Third postoperative day, her vital signs were stable.  She was afebrile.  Lungs were clear.  Heart sounds normal.  Bowel sounds sluggish.  Calves negative.  Dressing dry. The patient did ambulate about 16 feet in therapy, but had decided to go to a nursing facility.  She thinks that she needs further assistance secondary to her obesity and a limited help at home.  This is a reasonable decision and we will get arranged for her to have a transfer to a skilled nursing facility today when available.  CONDITION ON DISCHARGE:  Improved.  DISCHARGE MEDICATIONS:  See discharge medication reconciliation sheet.  DISCHARGE INSTRUCTIONS:  She is to undergo physical therapy twice a day, leave bandage intact for 10 days and then change to a dry dressing.  She is to take aspirin enteric-coated 325 mg one b.i.d. for total of 4 weeks for DVT prophylaxis and resume her usual home medications.  She is to keep the wound clean and dry for total of 3 weeks.  She will return to Dr. Nilsa Nutting office in 2 weeks for recheck.  Please call the office at [phone] or [phone] for appointment time and call sooner p.r.n. any problems.     Jaquelyn Bitter. Chabon, P.A.   ______________________________ Madlyn Frankel Charlann Boxer, M.D.    SJC/MEDQ  D:  10/12/2010  T:  10/12/2010  Job:  [id number]  Electronically Signed by Jodene Nam P.A. on 10/15/2010 02:56:45 PM Electronically Signed by Durene Romans M.D. on 10/16/2010 09:12:23 AM

## 2010-10-16 NOTE — Op Note (Signed)
NAMEMarland Kitchen  Denise, [name] NO.:  [id number]  MEDICAL RECORD NO.:  [id number]  LOCATION:  1609                         FACILITY:  [facility]  PHYSICIAN:  Madlyn Frankel. Charlann Boxer, M.D.  DATE OF BIRTH:  [birth year]  DATE OF PROCEDURE:  10/09/2010 DATE OF DISCHARGE:                              OPERATIVE REPORT   PREOPERATIVE DIAGNOSIS:  Left knee osteoarthritis.  POSTOPERATIVE DIAGNOSES: 1. Left knee osteoarthritis. 2. Diabetes.  PROCEDURE:  Left total knee replacement utilizing DePuy component size 3 femur, 4 tibia, 10-mm insert and a 38 patellar button.  SURGEON:  Madlyn Frankel. Charlann Boxer, M.D.  ASSISTANT:  Jaquelyn Bitter. Chabon, PA  ANESTHESIA:  Preoperative regional femoral nerve block plus general anesthetic.  SPECIMENS:  None.  COMPLICATIONS:  None.  TOURNIQUET TIME:  12 minutes at 78 mmHg to be discussed during the body of the procedure.  BLOOD LOSS:  200 cc.  INDICATION FOR PROCEDURE:  Denise [name] is a 65 year old female who presented in office for a long period of time for bilateral knee osteoarthritis.  We have been trying to manage this conservatively over the years with cortisone injections.  She was recently presented with a significant reduction in overall quality of life, and the desire at this point is to have her knees definitely managed.  We discussed this over the years, and at this point reviewed the risks of infection, DVT, component failure, need for revision surgery as well as the potential to have her right knee done at some point too.  Consent was obtained for benefit of pain relief.  PROCEDURE IN DETAIL:  The patient was brought to the operative theater. Once adequate anesthesia, preoperative antibiotics, Ancef were administered, she was positioned supine with the left thigh tourniquet placed.  Left lower extremity was then prepped and draped in sterile fashion.  A time-out was performed identifying the patient, planned procedure of extremity.   Left foot was in the [facility] leg holder.  The leg was exsanguinated, tourniquet elevated to 250 mmHg.  Following initial midline incision followed by median arthrotomy, it was evident that her pressures were exceeding that what the tourniquet was. Also worried about a bit of venous tourniquet, but her pressures were significantly elevated with systolic pressures in the 180s and 190s and the diastolics in 100s to 110s.  I  went ahead and let the tourniquet down, so we could do at least obtain some control of bleeding during this time.  The knee was exposed otherwise in routine fashion with proximal medial peel.  Attention was first directed to the patella, precut measurement was noted to be about 24 mm.  I resected down to about 14 mm, used a 38 patellar button to restore patellar height back to its normal level. Lug holes were drilled and a metal shim was placed to protect the patellofemoral retraction saw blades.  At this point, the attention was directed to femur.  The femoral canals opened the drill, irrigated to try to prevent fat emboli. Intramedullary rod was placed in the femur and had 3 degrees of valgus 10 mm of bone resected off the distal femur.  Following this resection, the tibia subluxated anteriorly.  Using extramedullary guide 10 mm resection was made based off the  proximal lateral tibia.  We then confirmed the gap, which had been stable to the 10 mm insert as well as at the cut was perpendicular in the coronal plane.  At this point, it was thought the femur to be a size 3, the size 3 rotation block was pinned into position anterior reference using the C clamp set rotation of the proximal tibia.  The 4 in 1 cutting block was then pinned into position.  Anterior, posterior chamfer cuts were then made including synovectomy medial and lateral and suprapatellar. Final box cut was made up the lateral aspect of distal femur.  The tibia was then subluxated again.  The  cut surface even after removing medial osteophytes seemed to be best fit with size 4 tibial tray.  This was pinned in position through the medial 3rd of the tubercle drilled and keel punched.  A trial reduction was now carried out with 3 femur, 4 tibia and a 10 mm insert, with this the knee came to full extension, the patella tracked through the trochlea without application of pressure.  Given all these findings, the trial components were removed the knee.  The knee was irrigated with normal saline solution and pulse lavage.  Following the cleansing of the knee, I did inject the knee with a 20 cc mixture of Exparel, a lysosomal form of bupivacaine in an effort to help with pain relief postop.  This was injected within the capsular tissues medially and laterally on the posterior aspect.  Final components were opened and cement mixed.  The final components were then cemented on the clean and dried cut surface of the bone.  The knee was brought to extension with a 10-mm insert and then extruded cement was removed.  Once the cemented had fully cured and excessive cement was removed throughout the knee, the final 10 mm insert was thenchosen and placed into the knee.  Medium Hemovac drain was placed deep. The tourniquet had been let down after the application of cement at 12 minutes total.  We irrigated the knee copiously with normal saline solution.  The extensor mechanism was then reapproximated with #1 Vicryl with the knee in flexion.  The remainder of the wound was closed with 2-0 Vicryl and running 4-0 Monocryl.  The knee was cleaned, dried and dressed sterilely using Dermabond and Aquacel dressing, drain site was dressed separately.  The knee was wrapped in an Ace wrap.  She was then brought to recovery room in stable condition, tolerating the procedure well.     Madlyn Frankel Charlann Boxer, M.D.     MDO/MEDQ  D:  10/09/2010  T:  10/10/2010  Job:  [id number]  Electronically Signed by Durene Romans M.D. on 10/16/2010 09:12:19 AM

## 2010-11-15 ENCOUNTER — Other Ambulatory Visit: Payer: Self-pay | Admitting: Orthopedic Surgery

## 2010-11-15 ENCOUNTER — Encounter (HOSPITAL_COMMUNITY): Payer: Medicare Other

## 2010-11-15 LAB — DIFFERENTIAL
Basophils Absolute: 0.1 10*3/uL (ref 0.0–0.1)
Lymphocytes Relative: 32 % (ref 12–46)
Monocytes Absolute: 0.6 10*3/uL (ref 0.1–1.0)
Neutro Abs: 5.8 10*3/uL (ref 1.7–7.7)

## 2010-11-15 LAB — URINALYSIS, ROUTINE W REFLEX MICROSCOPIC
Bilirubin Urine: NEGATIVE
Leukocytes, UA: NEGATIVE
Nitrite: NEGATIVE
Specific Gravity, Urine: 1.017 (ref 1.005–1.030)
pH: 6 (ref 5.0–8.0)

## 2010-11-15 LAB — CBC
HCT: 39.6 % (ref 36.0–46.0)
Hemoglobin: 12.4 g/dL (ref 12.0–15.0)
WBC: 9.6 10*3/uL (ref 4.0–10.5)

## 2010-11-15 LAB — BASIC METABOLIC PANEL
CO2: 27 mEq/L (ref 19–32)
Chloride: 102 mEq/L (ref 96–112)
Sodium: 140 mEq/L (ref 135–145)

## 2010-11-15 LAB — SURGICAL PCR SCREEN: Staphylococcus aureus: NEGATIVE

## 2010-11-15 LAB — PROTIME-INR
INR: 1.02 (ref 0.00–1.49)
Prothrombin Time: 13.6 seconds (ref 11.6–15.2)

## 2010-11-19 NOTE — H&P (Signed)
NAME:  Denise [name], Denise [name]        ACCOUNT NO.:  [id number]  MEDICAL RECORD NO.:  [id number]  LOCATION:                                 FACILITY:  PHYSICIAN:  Madlyn Frankel. Charlann Boxer, M.D.  DATE OF BIRTH:  11-10-1945  DATE OF ADMISSION:  11/20/2010 DATE OF DISCHARGE:                             HISTORY & PHYSICAL   ADMISSION DIAGNOSES: 1. End-stage right knee osteoarthritis. 2. 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 3. Diabetes. 4. Obesity. 5. Hypertension. 6. Asthma.  ADMITTING HISTORY:  [name] is a 65 year old patient of mine for longstanding management conservatively of bilateral knee osteoarthritis and she ultimately decided to have her knees replaced in a staged fashion with the left knee being replaced on October 09, 2010.  She had planned to have her right knee replaced and was scheduled for 6 weeks after that surgery.  At the time of evaluation in the office, she was noted to have some postoperative arthrofibrosis on her left knee for which we went ahead and did possibly a manipulation at the time of surgery.  She had a chance to review questions and concerns.  Consent will be obtained based on our discussion.  PAST MEDICAL HISTORY:  Includes: 1. Bilateral knee osteoarthritis with a history at this point of left     total knee replacement with some postoperative arthrofibrosis. 2. Diabetes. 3. Obesity. 4. Hypertension.  PAST SURGICAL HISTORY:  Includes: 1. Left total knee replacement as noted on October 03, 2010. 2. Subtotal thyroidectomy. 3. History of spinal fusion in 2003 and 2010.  SOCIAL HISTORY:  She is a retired Designer, jewellery.  She occasionally has alcohol and does not use any tobacco.  SOCIAL HISTORY:  Socially, she is planning on return to nursing facility for rehab at [facility] for 2 weeks or so where will work on range of motion and strengthening and recover with both knees prior to  going home.  MEDICATIONS:  At this point, she has not used any significant narcotics. She continues on: 1. Lisinopril 40 mg daily. 2. Glimepiride 2 mg daily. 3. Metformin ER 500 mg 4 tablets daily. 4. Potassium chloride 10 mEq 4 tablets daily. 5. Lasix 40 mg b.i.d. 6. Ibuprofen 800 mg as needed. 7. Norvasc 10 mg daily. 8. Zyrtec 10 mg daily. 9. Aspirin 81 mg daily. 10.Advair 100/50 b.i.d. as needed. 11.Vitamin D3 1000 units daily. 12.Multivitamin daily. 13.Fish oil 1400 mg daily.  ALLERGIES:  None.  PREOPERATIVE LABORATORIES:  EKG and chest x-ray will be done through [facility] evaluation.  She recently had surgery.  So other than labs, she may not need any further clearance or studies.  ASSESSMENT: 1. Advanced right knee osteoarthritis with planned right total knee     replacement on November 20, 2010. 2. 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  PLAN:  After reviewing with Ms. Owensby in the office her current situation, she will have her planned right total knee replacement on November 20, 2010.  We will plan on at the same time manipulating her left knee.  Postoperatively, she will stay in the hospital for 2 to 3 days and we will  then transfer to [facility] for therapy.  I will try to arrange for home CPM machine to initially be used at the nursing facility and to take home particularly for left knee with arthrofibrosis.  She can switch it over and use it for the right knee.  In the office today, postoperative medications of stool softeners, aspirin, and Robaxin were given.  She will get her pain medicines through the hospital at time of discharge.  Questions were encouraged, answered and reviewed with her today.     Madlyn Frankel Charlann Boxer, M.D.     MDO/MEDQ  D:  11/15/2010  T:  11/15/2010  Job:  [id number]  Electronically Signed by Durene Romans M.D. on 11/19/2010 10:15:14 AM

## 2010-11-20 ENCOUNTER — Inpatient Hospital Stay (HOSPITAL_COMMUNITY)
Admission: RE | Admit: 2010-11-20 | Discharge: 2010-11-23 | DRG: 470 | Disposition: A | Discharge status: Skilled Nursing Facility | Payer: Medicare Other | Source: Ambulatory Visit | Attending: Orthopedic Surgery | Admitting: Orthopedic Surgery

## 2010-11-20 DIAGNOSIS — E669 Obesity, unspecified: Secondary | ICD-10-CM | POA: Diagnosis present

## 2010-11-20 DIAGNOSIS — M24669 Ankylosis, unspecified knee: Secondary | ICD-10-CM | POA: Diagnosis present

## 2010-11-20 DIAGNOSIS — J45909 Unspecified asthma, uncomplicated: Secondary | ICD-10-CM | POA: Diagnosis present

## 2010-11-20 DIAGNOSIS — Z01812 Encounter for preprocedural laboratory examination: Secondary | ICD-10-CM

## 2010-11-20 DIAGNOSIS — E890 Postprocedural hypothyroidism: Secondary | ICD-10-CM | POA: Diagnosis present

## 2010-11-20 DIAGNOSIS — D649 Anemia, unspecified: Secondary | ICD-10-CM | POA: Diagnosis not present

## 2010-11-20 DIAGNOSIS — Z981 Arthrodesis status: Secondary | ICD-10-CM

## 2010-11-20 DIAGNOSIS — I10 Essential (primary) hypertension: Secondary | ICD-10-CM | POA: Diagnosis present

## 2010-11-20 DIAGNOSIS — E119 Type 2 diabetes mellitus without complications: Secondary | ICD-10-CM | POA: Diagnosis present

## 2010-11-20 DIAGNOSIS — M1710 Unilateral primary osteoarthritis, unspecified knee: Principal | ICD-10-CM | POA: Diagnosis present

## 2010-11-20 LAB — TYPE AND SCREEN
ABO/RH(D): A POS
Antibody Screen: NEGATIVE

## 2010-11-20 LAB — GLUCOSE, CAPILLARY: Glucose-Capillary: 111 mg/dL — ABNORMAL HIGH (ref 70–99)

## 2010-11-20 NOTE — Op Note (Signed)
NAMEMarland Kitchen  Denise [name], Denise [name] NO.:  [id number]  MEDICAL RECORD NO.:  [id number]  LOCATION:  0003                         FACILITY:  [facility]  PHYSICIAN:  Madlyn Frankel. Charlann Boxer, M.D.  DATE OF BIRTH:  [birth year]  DATE OF PROCEDURE:  11/20/2010 DATE OF DISCHARGE:                              OPERATIVE REPORT   PREOPERATIVE DIAGNOSES: 1. Status post left total knee replacement with arthrofibrosis. 2. Right knee osteoarthritis.  POSTOPERATIVE DIAGNOSES: 1. Status post left total knee replacement with arthrofibrosis. 2. Right knee osteoarthritis.  PROCEDURE: 1. Left knee manipulation under anesthesia. 2. Right total knee replacement utilizing DePuy rotating platform     posterior stabilized knee system, size 3 femur, 3 tibia, 10-mm     insert, and 38 patellar button.  SURGEON:  Madlyn Frankel. Charlann Boxer, M.D.  ASSISTANT:  Jaquelyn Bitter. Chabon, P.A.  ANESTHESIA:  General plus a preoperative regional femoral nerve block.  SPECIMENS:  None.  COMPLICATIONS:  None.  BLOOD LOSS:  About 200 mL.  DRAINS:  One medium Hemovac.  TOURNIQUET TIME:  42 minutes at 250 mmHg that was increased to 350 mmHg due to some intraoperative blood pressure issues.  The patient otherwise stable in recovery room.  INDICATIONS FOR PROCEDURE:  Ms. Blackwelder is a 65 year old female with history of bilateral knee advanced osteoarthritis.  She had failed conservative measures.  She has undergone a left total knee replacement approximately 6 weeks ago.  She is seen and evaluated in the office and noted to have some limited range of motion on this left knee.  I suggested that time of this right total knee replacement that we examine and manipulate it to help facilitate her recovery.  In addition, we discussed the risks and benefits of the right total knee replacement, she had been through the left.  Risks and benefits were reviewed and consent was obtained for benefit of pain relief.  Plan was for  postoperative rehab state.  We will also plan to use CPM to maximize her overall function for return of both of these.  PROCEDURE IN DETAIL:  The patient was brought to operative theater. Once adequate anesthesia was established, the patient was positioned supine.  Attention was first directed to her left leg.  Time-out was performed identifying the patient, planned procedure and extremities and procedure on this extremity.  Examination revealed that her range of motion was about 5 to 80 degrees with some passive extension and with a little bit of force in the distal femur, I was able to get the knee out to almost full extension with a little bit of lysis of adhesions posteriorly.  When I flex the hip and apply pressure, the proximal tibia and distal femur, constant pressure was applied and I was able to lyse the adhesions and get her knee flexed back to her hamstring.  Knee ligaments remained stable.  Patella was mobile.  Following this procedure, attention was now directed to the right leg.  In the right lower extremity, tourniquet was placed.  The right lower extremity was then prepped and draped in sterile fashion with right leg placed into [facility] leg holder.  A time-out performed, identifying the patient, planned procedure and extremity.  Leg was exsanguinated, tourniquet elevated to  250 mmHg.  Midline incision was made followed by median parapatellar arthrotomy.  It is about at this point we were able to increase the tourniquet up to 350 mmHg to help with her blood loss.  Following initial exposure and debridement, attention was directed to patella.  Precut measurement was 22 mL, resected out 14 mm using 38 patellar button to restore height and cover the cut surface.  Lug holes were drilled and metal shim was placed to protect the patella from retractor and saw blades.  Attention was now directed to femur.  Femoral canal was opened, drilled and irrigated to try to prevent fat  emboli.  Intramedullary rod was passed at 3 degrees of valgus, 10 mm of bone resected off the distal femur.  Following this resection, the tibia was subluxated anteriorly and using extramedullary guide, cut was made 10 mm off proximal lateral tibia.  We confirmed the extension gap would be stable with a 10-mm insert medially and laterally.  Also confirmed that the cut was perpendicular in the coronal plane.  At this point, the femur was sized to be a size 3.  Size 3 rotation block was pinned into position, anterior referenced using a C-clamp to set rotation.  Following this, the 4 in 1 cutting block was pinned in position.  Anterior, posterior and chamfer cuts made without difficulty nor notching.  Final box cut made off the lateral aspect to distal femur.  Following this, the tibia was subluxated anteriorly.  Cut surface, after removing medial osteophytes, seemed to be best fit with size 3 tibial tray.  It was pinned in to position, drilled and keel punched.  Trial reduction was now carried out with a 3 femur, 3 tibia and 10-mm insert.  The knee came to full extension and the knee ligaments from stable from extension to flexion.  The patella trackedthrough the trochlea.  Given these findings, trial components removed. Final components were opened including polyethylene insert.  The knee was irrigated with normal saline solution.  No injection was done in the joint.  Based on preoperative, we used a femoral nerve block.  The cement was mixed and the final components were cemented with gentamicin and impregnated high viscosity cement.  The knee was brought to extension with 10-mm insert and extruded cement removed.  Once cement had fully cured, excess cement was removed throughout the knee.  The final 10-mm insert was chosen and placed in the knee.  Tourniquet had been let down after 42 minutes without significant hemostasis.  The knee was re-irrigated with normal saline solution.   A medium Hemovac drain was placed deep.  Extensor mechanism was then reapproximated using #1 Vicryl.  With the knee in flexion, the remainder of the wound was closed with 2-0 Vicryl and running 4-0 Monocryl.  The knee was cleaned, dried and dressed sterilely using Dermabond and Aquacel dressing.  Drain site dressed separately and knee wrapped in Ace wrap.  She was then brought to recovery room in stable condition, tolerating the procedure well.     Madlyn Frankel Charlann Boxer, M.D.     MDO/MEDQ  D:  11/20/2010  T:  11/20/2010  Job:  [id number]  Electronically Signed by Durene Romans M.D. on 11/20/2010 08:06:20 PM

## 2010-11-21 LAB — BASIC METABOLIC PANEL
BUN: 7 mg/dL (ref 6–23)
CO2: 27 mEq/L (ref 19–32)
Chloride: 104 mEq/L (ref 96–112)
Creatinine, Ser: 0.59 mg/dL (ref 0.50–1.10)
GFR calc Af Amer: >60 mL/min (ref >60)
Glucose, Bld: 117 mg/dL — ABNORMAL HIGH (ref 70–99)
Potassium: 3.9 mEq/L (ref 3.5–5.1)

## 2010-11-21 LAB — CBC
HCT: 31.5 % — ABNORMAL LOW (ref 36.0–46.0)
Hemoglobin: 10.1 g/dL — ABNORMAL LOW (ref 12.0–15.0)
MCV: 79.1 fL (ref 78.0–100.0)
WBC: 12.6 10*3/uL — ABNORMAL HIGH (ref 4.0–10.5)

## 2010-11-22 LAB — GLUCOSE, CAPILLARY
Glucose-Capillary: 125 mg/dL — ABNORMAL HIGH (ref 70–99)
Glucose-Capillary: 147 mg/dL — ABNORMAL HIGH (ref 70–99)
Glucose-Capillary: 124 mg/dL — ABNORMAL HIGH (ref 70–99)
Glucose-Capillary: 53 mg/dL — ABNORMAL LOW (ref 70–99)
Glucose-Capillary: 101 mg/dL — ABNORMAL HIGH (ref 70–99)

## 2010-11-22 LAB — BASIC METABOLIC PANEL
BUN: 6 mg/dL (ref 6–23)
Creatinine, Ser: 0.52 mg/dL (ref 0.50–1.10)
GFR calc Af Amer: >60 mL/min (ref >60)
GFR calc non Af Amer: >60 mL/min (ref >60)
Glucose, Bld: 99 mg/dL (ref 70–99)
Potassium: 3.1 mEq/L — ABNORMAL LOW (ref 3.5–5.1)

## 2010-11-22 LAB — CBC
HCT: 27.7 % — ABNORMAL LOW (ref 36.0–46.0)
Hemoglobin: 8.9 g/dL — ABNORMAL LOW (ref 12.0–15.0)
MCH: 25.2 pg — ABNORMAL LOW (ref 26.0–34.0)
MCHC: 32.1 g/dL (ref 30.0–36.0)
MCV: 78.5 fL (ref 78.0–100.0)
RDW: 15.5 % (ref 11.5–15.5)

## 2010-11-23 LAB — GLUCOSE, CAPILLARY
Glucose-Capillary: 115 mg/dL — ABNORMAL HIGH (ref 70–99)
Glucose-Capillary: 98 mg/dL (ref 70–99)

## 2010-11-26 LAB — GLUCOSE, CAPILLARY: Glucose-Capillary: 75 mg/dL (ref 70–99)

## 2010-12-01 NOTE — Discharge Summary (Signed)
NAMEMarland Kitchen  Denise [name], [name] NO.:  [id number]  MEDICAL RECORD NO.:  [id number]  LOCATION:  1621                         FACILITY:  [facility]  PHYSICIAN:  Denise [name] [name], M.D.  DATE OF BIRTH:  01-15-1946  DATE OF ADMISSION:  11/20/2010 DATE OF DISCHARGE:  11/23/2010                        DISCHARGE SUMMARY - REFERRING   PROCEDURE:  Right TKA and left knee manipulation.  ADMITTING DIAGNOSES: 1. Status post left total knee replacement arthrofibrosis. 2. Right knee osteoarthritis.  DISCHARGE DIAGNOSES: 1. Status post manipulation of left total knee replacement. 2. Right total knee replacement. 3. Diabetes. 4. Obesity. 5. Hypertension. 6. Asthma.  HISTORY OF PRESENT ILLNESS:  [name] is a 65 year old patient of Dr. Charlann [name] with long-standing management conservatively of bilateral knee osteoarthritis.  She ultimately decided to have knee replacement in staged fashion with her left knee being replaced on October 09, 2010.  As she had this done, on postoperative visit she did have some arthrofibrosis of the left knee for which is planned a manipulation of the knee.  Also on x-ray, osteoarthritis of the right knee.  The patient wished to proceed with a right total knee replacement.  Risks, benefits and expectations of the procedure were discussed with the patient.  The patient understands the risks, benefits and expectations and wished to proceed.  HOSPITAL COURSE:  The patient underwent the above-staged procedures on November 20, 2010.  The patient tolerated the procedures well.  She was brought to the recovery room in good condition, subsequently to the floor.  Throughout her visit in the hospital, she was doing well. Minimal pain.  She was afebrile.  Vital signs were stable.  Distally neurovascularly intact.  She progressed well with the PT.  CPM machine was used to help range of motion of her knees.  The patient did have a hemoglobin drop of 10.1 to 8.9.  The patient  stayed asymptomatic and no transfusion was needed.  The patient progressed well and felt to be doing well enough to be discharged to skilled nursing facility on November 23, 2010.  CONDITION ON DISCHARGE:  Good.  DISCHARGE INSTRUCTIONS:  The patient will be discharged to skilled nursing facility today November 23, 2010.  The patient will follow up with Dr. Charlann [name] at the [facility] in 2 weeks.  The patient is discharged with instructions to continue using her CPM machine for 2 months.  This includes using it at the nursing facility that is 0 to 60, increase as tolerable for 6 hours a day.  The patient is weightbearing as tolerated.  DISCHARGE MEDICATIONS: 1. Colace 100 mg 1 p.o. b.i.d. for constipation. 2. Aspirin enteric-coated 325 mg 1 p.o. b.i.d. x6 weeks. 3. Iron sulfate 325 mg 1 p.o. t.i.d. x2 to 3 weeks. 4. Advair Diskus 100/50 one puff inhaled daily. 5. Glimepiride 2 mg 1 p.o. q.a.m. 6. Fish oil 1200 mg 1 p.o. daily. 7. Lasix 40 mg 1 p.o. b.i.d. 8. Lipitor 40 mg 1 p.o. q.a.m. 9. Lisinopril 40 mg 1 p.o. q.a.m. 10.Metformin XR 500 mg 4 tabs p.o. q.a.m. 11.Multivitamin  1 p.o. daily. 12.Robaxin 500 mg 1 p.o. q.6 h. p.r.n. muscle spasms. 13.Norvasc 10 mg 1 p.o. q.a.m. 14.Oxycodone 5 mg 1 to 2 p.o. q.4-6 h.  p.r.n. pain. 15.Potassium chloride 10 mEq 4 p.o. q.a.m. 16.MiraLax 17 g 1 p.o. daily p.r.n. constipation. 17.Vitamin D3 1000 units 1 p.o. daily. 18.Zyrtec 10 mg 1 p.o. daily.    ______________________________ Denise [name] Gins, PA   ______________________________ Denise [name] [name], M.D.    MB/MEDQ  D:  11/23/2010  T:  11/23/2010  Job:  [id number]  Electronically Signed by Denise [name] Gins PA on 11/27/2010 11:36:02 AM Electronically Signed by Denise [name] [name] M.D. on 12/01/2010 09:19:41 AM

## 2011-01-30 ENCOUNTER — Other Ambulatory Visit: Payer: Self-pay | Admitting: Internal Medicine

## 2011-02-04 ENCOUNTER — Telehealth: Payer: Self-pay | Admitting: *Deleted

## 2011-02-04 NOTE — Telephone Encounter (Signed)
Pt left VM req to be worked in today with Dr Jonny Ruiz.

## 2011-02-04 NOTE — Telephone Encounter (Signed)
Pt left VM c/o nausea, fatigue, no appetite, headache, fever and chills. Says she was seen Thursday and wants to know if she needs return OV?? I do not see OV Thursday but I do see OV set up for tomorrow.

## 2011-02-04 NOTE — Telephone Encounter (Signed)
Pt advised via VM to keep scheduled appt for Tuesday 10/09

## 2011-02-05 ENCOUNTER — Encounter: Payer: Self-pay | Admitting: Internal Medicine

## 2011-02-05 ENCOUNTER — Ambulatory Visit (INDEPENDENT_AMBULATORY_CARE_PROVIDER_SITE_OTHER): Payer: Medicare Other | Admitting: Internal Medicine

## 2011-02-05 ENCOUNTER — Other Ambulatory Visit (INDEPENDENT_AMBULATORY_CARE_PROVIDER_SITE_OTHER): Payer: Medicare Other

## 2011-02-05 VITALS — BP 120/78 | HR 73 | Temp 98.6°F | Ht 62.0 in | Wt 212.0 lb

## 2011-02-05 DIAGNOSIS — R634 Abnormal weight loss: Secondary | ICD-10-CM

## 2011-02-05 DIAGNOSIS — J45909 Unspecified asthma, uncomplicated: Secondary | ICD-10-CM

## 2011-02-05 DIAGNOSIS — E119 Type 2 diabetes mellitus without complications: Secondary | ICD-10-CM

## 2011-02-05 DIAGNOSIS — J019 Acute sinusitis, unspecified: Secondary | ICD-10-CM

## 2011-02-05 LAB — BASIC METABOLIC PANEL
CO2: 28 mEq/L (ref 19–32)
Calcium: 10.2 mg/dL (ref 8.4–10.5)
Creatinine, Ser: 0.6 mg/dL (ref 0.4–1.2)
Sodium: 144 mEq/L (ref 135–145)

## 2011-02-05 LAB — CBC WITH DIFFERENTIAL/PLATELET
Basophils Absolute: 0.1 10*3/uL (ref 0.0–0.1)
Basophils Relative: 0.9 % (ref 0.0–3.0)
Eosinophils Absolute: 0.2 10*3/uL (ref 0.0–0.7)
HCT: 44.2 % (ref 36.0–46.0)
Hemoglobin: 14.1 g/dL (ref 12.0–15.0)
Lymphs Abs: 4.2 10*3/uL — ABNORMAL HIGH (ref 0.7–4.0)
MCHC: 32 g/dL (ref 30.0–36.0)
MCV: 78.2 fl (ref 78.0–100.0)
Monocytes Absolute: 0.7 10*3/uL (ref 0.1–1.0)
Neutro Abs: 6.4 10*3/uL (ref 1.4–7.7)
RBC: 5.65 Mil/uL — ABNORMAL HIGH (ref 3.87–5.11)
RDW: 18.9 % — ABNORMAL HIGH (ref 11.5–14.6)

## 2011-02-05 LAB — HEPATIC FUNCTION PANEL
Alkaline Phosphatase: 69 U/L (ref 39–117)
Bilirubin, Direct: 0.1 mg/dL (ref 0.0–0.3)
Total Bilirubin: 0.7 mg/dL (ref 0.3–1.2)

## 2011-02-05 LAB — LIPID PANEL
HDL: 72.6 mg/dL (ref >39.00)
LDL Cholesterol: 59 mg/dL (ref 0–99)
Total CHOL/HDL Ratio: 2
Triglycerides: 85 mg/dL (ref 0.0–149.0)

## 2011-02-05 MED ORDER — LEVOFLOXACIN 250 MG PO TABS: 250.0000 mg | ORAL_TABLET | Freq: Every day | ORAL | Status: AC

## 2011-02-05 MED ORDER — IBUPROFEN 800 MG PO TABS
800.0000 mg | ORAL_TABLET | Freq: Three times a day (TID) | ORAL | Status: AC | PRN
Start: 2011-02-05 — End: 2011-02-15

## 2011-02-05 MED ORDER — ONDANSETRON HCL 4 MG PO TABS: 4.0000 mg | ORAL_TABLET | Freq: Three times a day (TID) | ORAL | Status: AC | PRN

## 2011-02-05 NOTE — Patient Instructions (Signed)
.  Take all new medications as prescribed Continue all other medications as before Please continue your Physical Therapy as you are doing Please go to LAB in the Basement for the blood and/or urine tests to be done today Please call the phone number [phone] (the [employer] System) for results of testing in 2-3 days;  When calling, simply dial the number, and when prompted enter the MRN number above (the Medical Record Number) and the # key, then the message should start. Please return in 6 months, or sooner if needed

## 2011-02-05 NOTE — Telephone Encounter (Signed)
Patient was seen in the office today 02/05/2011
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
## 2011-02-10 NOTE — Assessment & Plan Note (Signed)
stable overall by hx and exam, most recent data reviewed with pt, and pt to continue medical treatment as before  SpO2 Readings from Last 3 Encounters:  02/05/11 98%  09/03/10 96%  03/15/10 96%

## 2011-02-10 NOTE — Assessment & Plan Note (Signed)
stable overall by hx and exam, most recent data reviewed with pt, and pt to continue medical treatment as before Lab Results  Component Value Date   HGBA1C 5.8 02/05/2011

## 2011-02-10 NOTE — Assessment & Plan Note (Signed)
Most likely stress related post surgury recently - pt declines further eval today,  to f/u any worsening symptoms or concerns

## 2011-02-10 NOTE — Assessment & Plan Note (Signed)
Mild to mod, for antibx course,  to f/u any worsening symptoms or concerns 

## 2011-02-27 ENCOUNTER — Other Ambulatory Visit: Payer: Self-pay | Admitting: Internal Medicine

## 2011-02-28 ENCOUNTER — Other Ambulatory Visit: Payer: Self-pay

## 2011-02-28 MED ORDER — GLUCOSE BLOOD VI STRP: ORAL_STRIP | Status: DC

## 2011-02-28 MED ORDER — METFORMIN HCL ER 500 MG PO TB24: 500.0000 mg | ORAL_TABLET | Freq: Four times a day (QID) | ORAL | Status: DC

## 2011-02-28 MED ORDER — FUROSEMIDE 40 MG PO TABS: 40.0000 mg | ORAL_TABLET | Freq: Two times a day (BID) | ORAL | Status: DC

## 2011-02-28 MED ORDER — LISINOPRIL 40 MG PO TABS: 40.0000 mg | ORAL_TABLET | Freq: Every day | ORAL | Status: DC

## 2011-02-28 MED ORDER — ATORVASTATIN CALCIUM 40 MG PO TABS: 40.0000 mg | ORAL_TABLET | Freq: Every day | ORAL | Status: DC

## 2011-02-28 MED ORDER — GLIMEPIRIDE 2 MG PO TABS: 2.0000 mg | ORAL_TABLET | Freq: Every day | ORAL | Status: DC

## 2011-02-28 MED ORDER — VITAMIN D3 25 MCG (1000 UT) PO CAPS: 1.0000 | ORAL_CAPSULE | Freq: Every day | ORAL | Status: DC

## 2011-02-28 MED ORDER — FLUTICASONE-SALMETEROL 100-50 MCG/DOSE IN AEPB: 1.0000 | INHALATION_SPRAY | Freq: Two times a day (BID) | RESPIRATORY_TRACT | Status: DC

## 2011-02-28 MED ORDER — LEVALBUTEROL TARTRATE 45 MCG/ACT IN AERO: 2.0000 | INHALATION_SPRAY | Freq: Every day | RESPIRATORY_TRACT | Status: DC

## 2011-02-28 MED ORDER — POTASSIUM CHLORIDE ER 10 MEQ PO CPCR: ORAL_CAPSULE | ORAL | Status: DC

## 2011-03-06 ENCOUNTER — Ambulatory Visit: Payer: Federal, State, Local not specified - PPO | Admitting: Internal Medicine

## 2011-04-24 ENCOUNTER — Encounter: Payer: Self-pay | Admitting: Internal Medicine

## 2011-07-31 DIAGNOSIS — H2510 Age-related nuclear cataract, unspecified eye: Secondary | ICD-10-CM | POA: Diagnosis not present

## 2011-07-31 DIAGNOSIS — H25019 Cortical age-related cataract, unspecified eye: Secondary | ICD-10-CM | POA: Diagnosis not present

## 2011-08-06 ENCOUNTER — Encounter: Payer: Self-pay | Admitting: Internal Medicine

## 2011-08-06 ENCOUNTER — Other Ambulatory Visit: Payer: Self-pay

## 2011-08-06 ENCOUNTER — Ambulatory Visit (INDEPENDENT_AMBULATORY_CARE_PROVIDER_SITE_OTHER): Payer: Medicare Other | Admitting: Internal Medicine

## 2011-08-06 ENCOUNTER — Other Ambulatory Visit (INDEPENDENT_AMBULATORY_CARE_PROVIDER_SITE_OTHER): Payer: Medicare Other

## 2011-08-06 VITALS — BP 122/68 | HR 92 | Temp 98.8°F | Ht 62.0 in | Wt 240.1 lb

## 2011-08-06 DIAGNOSIS — E785 Hyperlipidemia, unspecified: Secondary | ICD-10-CM

## 2011-08-06 DIAGNOSIS — G4762 Sleep related leg cramps: Secondary | ICD-10-CM | POA: Diagnosis not present

## 2011-08-06 DIAGNOSIS — F3289 Other specified depressive episodes: Secondary | ICD-10-CM

## 2011-08-06 DIAGNOSIS — I10 Essential (primary) hypertension: Secondary | ICD-10-CM | POA: Diagnosis not present

## 2011-08-06 DIAGNOSIS — E119 Type 2 diabetes mellitus without complications: Secondary | ICD-10-CM | POA: Diagnosis not present

## 2011-08-06 DIAGNOSIS — F329 Major depressive disorder, single episode, unspecified: Secondary | ICD-10-CM

## 2011-08-06 LAB — HEPATIC FUNCTION PANEL
ALT: 25 U/L (ref 0–35)
AST: 32 U/L (ref 0–37)
Bilirubin, Direct: 0.1 mg/dL (ref 0.0–0.3)
Total Bilirubin: 0.5 mg/dL (ref 0.3–1.2)

## 2011-08-06 LAB — LIPID PANEL
HDL: 70.5 mg/dL (ref >39.00)
LDL Cholesterol: 81 mg/dL (ref 0–99)
Total CHOL/HDL Ratio: 2
VLDL: 12.6 mg/dL (ref 0.0–40.0)

## 2011-08-06 LAB — CBC WITH DIFFERENTIAL/PLATELET
Basophils Relative: 0.9 % (ref 0.0–3.0)
Eosinophils Absolute: 0.3 10*3/uL (ref 0.0–0.7)
Eosinophils Relative: 3.2 % (ref 0.0–5.0)
HCT: 40.4 % (ref 36.0–46.0)
Lymphs Abs: 3.1 10*3/uL (ref 0.7–4.0)
MCHC: 32.5 g/dL (ref 30.0–36.0)
MCV: 82.1 fl (ref 78.0–100.0)
Monocytes Absolute: 0.7 10*3/uL (ref 0.1–1.0)
Neutrophils Relative %: 57.8 % (ref 43.0–77.0)
Platelets: 205 10*3/uL (ref 150.0–400.0)

## 2011-08-06 LAB — BASIC METABOLIC PANEL
Chloride: 108 mEq/L (ref 96–112)
GFR: 93.62 mL/min (ref >60.00)
Potassium: 3.7 mEq/L (ref 3.5–5.1)
Sodium: 145 mEq/L (ref 135–145)

## 2011-08-06 LAB — TSH: TSH: 0.82 u[IU]/mL (ref 0.35–5.50)

## 2011-08-06 LAB — MAGNESIUM: Magnesium: 1.6 mg/dL (ref 1.5–2.5)

## 2011-08-06 MED ORDER — FUROSEMIDE 40 MG PO TABS: 40.0000 mg | ORAL_TABLET | Freq: Two times a day (BID) | ORAL | Status: DC

## 2011-08-06 MED ORDER — IBUPROFEN 800 MG PO TABS: 800.0000 mg | ORAL_TABLET | Freq: Three times a day (TID) | ORAL | Status: DC | PRN

## 2011-08-06 MED ORDER — LEVALBUTEROL TARTRATE 45 MCG/ACT IN AERO: 2.0000 | INHALATION_SPRAY | Freq: Every day | RESPIRATORY_TRACT | Status: DC

## 2011-08-06 MED ORDER — CETIRIZINE HCL 10 MG PO TABS: 10.0000 mg | ORAL_TABLET | Freq: Every day | ORAL | Status: DC | PRN

## 2011-08-06 MED ORDER — GLUCOSE BLOOD VI STRP: ORAL_STRIP | Status: DC

## 2011-08-06 MED ORDER — LISINOPRIL 40 MG PO TABS: 40.0000 mg | ORAL_TABLET | Freq: Every day | ORAL | Status: DC

## 2011-08-06 MED ORDER — FLUTICASONE-SALMETEROL 100-50 MCG/DOSE IN AEPB: 1.0000 | INHALATION_SPRAY | Freq: Two times a day (BID) | RESPIRATORY_TRACT | Status: DC

## 2011-08-06 MED ORDER — ATORVASTATIN CALCIUM 40 MG PO TABS: 40.0000 mg | ORAL_TABLET | Freq: Every day | ORAL | Status: DC

## 2011-08-06 MED ORDER — AMLODIPINE BESYLATE 10 MG PO TABS: 10.0000 mg | ORAL_TABLET | Freq: Every day | ORAL | Status: DC

## 2011-08-06 MED ORDER — POTASSIUM CHLORIDE ER 10 MEQ PO CPCR: ORAL_CAPSULE | ORAL | Status: DC

## 2011-08-06 MED ORDER — PROMETHAZINE HCL 25 MG PO TABS: 25.0000 mg | ORAL_TABLET | Freq: Four times a day (QID) | ORAL | Status: DC | PRN

## 2011-08-06 MED ORDER — METFORMIN HCL ER 500 MG PO TB24: 500.0000 mg | ORAL_TABLET | Freq: Four times a day (QID) | ORAL | Status: DC

## 2011-08-06 MED ORDER — GLIMEPIRIDE 2 MG PO TABS: 2.0000 mg | ORAL_TABLET | Freq: Every day | ORAL | Status: DC

## 2011-08-06 NOTE — Patient Instructions (Signed)
Continue all other medications as before Your refills were done today to [facility] Please have the pharmacy call with any refills you may need. Please go to LAB in the Basement for the blood and/or urine tests to be done today You will be contacted by phone if any changes need to be made immediately.  Otherwise, you will receive a letter about your results with an explanation. Please return in 6 months, or sooner if needed

## 2011-08-06 NOTE — Assessment & Plan Note (Signed)
stable overall by hx and exam, most recent data reviewed with pt, and pt to continue medical treatment as before, goal ldl < 70 - for lipids today Lab Results  Component Value Date   LDLCALC 59 02/05/2011

## 2011-08-06 NOTE — Assessment & Plan Note (Signed)
Mild to mod, unclear etiology. for mg and lyte check, declines flexeril qhs prn for now,  to f/u any worsening symptoms or concerns
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
## 2011-08-06 NOTE — Assessment & Plan Note (Signed)
stable overall by hx and exam, most recent data reviewed with pt, and pt to continue medical treatment as before  Lab Results  Component Value Date   HGBA1C 5.8 02/05/2011   To cont to work on wt loss, diet

## 2011-08-06 NOTE — Assessment & Plan Note (Signed)
stable overall by hx and exam, most recent data reviewed with pt, and pt to continue medical treatment as before, for labs today BP Readings from Last 3 Encounters:  08/06/11 122/68  02/05/11 120/78  09/03/10 124/68

## 2011-08-06 NOTE — Assessment & Plan Note (Signed)
stable overall by hx and exam, most recent data reviewed with pt, and pt to continue medical treatment as before  Lab Results  Component Value Date   WBC 11.6* 02/05/2011   HGB 14.1 02/05/2011   HCT 44.2 02/05/2011   PLT 266.0 02/05/2011   GLUCOSE 77 02/05/2011   CHOL 149 02/05/2011   TRIG 85.0 02/05/2011   HDL 72.60 02/05/2011   LDLCALC 59 02/05/2011   ALT 17 02/05/2011   AST 18 02/05/2011   NA 144 02/05/2011   K 4.1 02/05/2011   CL 106 02/05/2011   CREATININE 0.6 02/05/2011   BUN 9 02/05/2011   CO2 28 02/05/2011   TSH 0.86 02/05/2011   INR 1.02 11/15/2010   HGBA1C 5.8 02/05/2011   MICROALBUR 2.3* 09/03/2010

## 2011-08-07 ENCOUNTER — Ambulatory Visit: Payer: Medicare Other | Admitting: Internal Medicine

## 2011-08-08 LAB — HEMOGLOBIN A1C: Hgb A1c MFr Bld: 6.3 % (ref 4.6–6.5)

## 2011-08-09 ENCOUNTER — Encounter: Payer: Self-pay | Admitting: Internal Medicine

## 2011-08-20 DIAGNOSIS — IMO0002 Reserved for concepts with insufficient information to code with codable children: Secondary | ICD-10-CM | POA: Diagnosis not present

## 2011-08-20 DIAGNOSIS — H25019 Cortical age-related cataract, unspecified eye: Secondary | ICD-10-CM | POA: Diagnosis not present

## 2011-08-20 DIAGNOSIS — H2510 Age-related nuclear cataract, unspecified eye: Secondary | ICD-10-CM | POA: Diagnosis not present

## 2011-09-03 DIAGNOSIS — H2510 Age-related nuclear cataract, unspecified eye: Secondary | ICD-10-CM | POA: Diagnosis not present

## 2011-09-03 DIAGNOSIS — H25019 Cortical age-related cataract, unspecified eye: Secondary | ICD-10-CM | POA: Diagnosis not present

## 2011-09-03 DIAGNOSIS — IMO0002 Reserved for concepts with insufficient information to code with codable children: Secondary | ICD-10-CM | POA: Diagnosis not present

## 2011-10-22 DIAGNOSIS — M47817 Spondylosis without myelopathy or radiculopathy, lumbosacral region: Secondary | ICD-10-CM | POA: Diagnosis not present

## 2011-10-22 DIAGNOSIS — M48061 Spinal stenosis, lumbar region without neurogenic claudication: Secondary | ICD-10-CM | POA: Diagnosis not present

## 2011-10-22 DIAGNOSIS — IMO0002 Reserved for concepts with insufficient information to code with codable children: Secondary | ICD-10-CM | POA: Diagnosis not present

## 2011-10-22 DIAGNOSIS — M5000 Cervical disc disorder with myelopathy, unspecified cervical region: Secondary | ICD-10-CM | POA: Diagnosis not present

## 2011-10-23 ENCOUNTER — Other Ambulatory Visit: Payer: Self-pay | Admitting: Neurosurgery

## 2011-10-23 DIAGNOSIS — M47817 Spondylosis without myelopathy or radiculopathy, lumbosacral region: Secondary | ICD-10-CM

## 2011-11-01 ENCOUNTER — Ambulatory Visit
Admission: RE | Admit: 2011-11-01 | Discharge: 2011-11-01 | Disposition: A | Discharge status: Home / Self Care | Payer: Medicare Other | Source: Ambulatory Visit | Attending: Neurosurgery | Admitting: Neurosurgery

## 2011-11-01 DIAGNOSIS — R209 Unspecified disturbances of skin sensation: Secondary | ICD-10-CM | POA: Diagnosis not present

## 2011-11-01 DIAGNOSIS — M5126 Other intervertebral disc displacement, lumbar region: Secondary | ICD-10-CM | POA: Diagnosis not present

## 2011-11-01 DIAGNOSIS — M47817 Spondylosis without myelopathy or radiculopathy, lumbosacral region: Secondary | ICD-10-CM | POA: Diagnosis not present

## 2011-11-07 DIAGNOSIS — M47817 Spondylosis without myelopathy or radiculopathy, lumbosacral region: Secondary | ICD-10-CM | POA: Diagnosis not present

## 2011-11-12 DIAGNOSIS — M5137 Other intervertebral disc degeneration, lumbosacral region: Secondary | ICD-10-CM | POA: Diagnosis not present

## 2011-11-14 DIAGNOSIS — M5137 Other intervertebral disc degeneration, lumbosacral region: Secondary | ICD-10-CM | POA: Diagnosis not present

## 2011-11-19 DIAGNOSIS — M5137 Other intervertebral disc degeneration, lumbosacral region: Secondary | ICD-10-CM | POA: Diagnosis not present

## 2011-11-21 DIAGNOSIS — M5137 Other intervertebral disc degeneration, lumbosacral region: Secondary | ICD-10-CM | POA: Diagnosis not present

## 2011-11-28 DIAGNOSIS — M5137 Other intervertebral disc degeneration, lumbosacral region: Secondary | ICD-10-CM | POA: Diagnosis not present

## 2011-12-03 DIAGNOSIS — M5137 Other intervertebral disc degeneration, lumbosacral region: Secondary | ICD-10-CM | POA: Diagnosis not present

## 2011-12-05 DIAGNOSIS — M5137 Other intervertebral disc degeneration, lumbosacral region: Secondary | ICD-10-CM | POA: Diagnosis not present

## 2011-12-10 DIAGNOSIS — M5137 Other intervertebral disc degeneration, lumbosacral region: Secondary | ICD-10-CM | POA: Diagnosis not present

## 2011-12-13 DIAGNOSIS — M5137 Other intervertebral disc degeneration, lumbosacral region: Secondary | ICD-10-CM | POA: Diagnosis not present

## 2011-12-17 DIAGNOSIS — M5137 Other intervertebral disc degeneration, lumbosacral region: Secondary | ICD-10-CM | POA: Diagnosis not present

## 2011-12-19 DIAGNOSIS — M47817 Spondylosis without myelopathy or radiculopathy, lumbosacral region: Secondary | ICD-10-CM | POA: Diagnosis not present

## 2011-12-24 DIAGNOSIS — M5137 Other intervertebral disc degeneration, lumbosacral region: Secondary | ICD-10-CM | POA: Diagnosis not present

## 2011-12-31 DIAGNOSIS — M5137 Other intervertebral disc degeneration, lumbosacral region: Secondary | ICD-10-CM | POA: Diagnosis not present

## 2012-01-06 ENCOUNTER — Other Ambulatory Visit (INDEPENDENT_AMBULATORY_CARE_PROVIDER_SITE_OTHER): Payer: Medicare Other

## 2012-01-06 ENCOUNTER — Ambulatory Visit (INDEPENDENT_AMBULATORY_CARE_PROVIDER_SITE_OTHER): Payer: Medicare Other | Admitting: Internal Medicine

## 2012-01-06 ENCOUNTER — Ambulatory Visit (INDEPENDENT_AMBULATORY_CARE_PROVIDER_SITE_OTHER)
Admission: RE | Admit: 2012-01-06 | Discharge: 2012-01-06 | Disposition: A | Discharge status: Home / Self Care | Payer: Medicare Other | Source: Ambulatory Visit | Attending: Internal Medicine | Admitting: Internal Medicine

## 2012-01-06 ENCOUNTER — Encounter: Payer: Self-pay | Admitting: Internal Medicine

## 2012-01-06 VITALS — BP 132/88 | HR 79 | Temp 98.2°F | Ht 62.0 in | Wt 252.0 lb

## 2012-01-06 DIAGNOSIS — I5021 Acute systolic (congestive) heart failure: Secondary | ICD-10-CM

## 2012-01-06 DIAGNOSIS — R609 Edema, unspecified: Secondary | ICD-10-CM

## 2012-01-06 DIAGNOSIS — R0989 Other specified symptoms and signs involving the circulatory and respiratory systems: Secondary | ICD-10-CM | POA: Diagnosis not present

## 2012-01-06 DIAGNOSIS — I509 Heart failure, unspecified: Secondary | ICD-10-CM | POA: Diagnosis not present

## 2012-01-06 DIAGNOSIS — R0600 Dyspnea, unspecified: Secondary | ICD-10-CM

## 2012-01-06 DIAGNOSIS — E785 Hyperlipidemia, unspecified: Secondary | ICD-10-CM

## 2012-01-06 DIAGNOSIS — R0609 Other forms of dyspnea: Secondary | ICD-10-CM

## 2012-01-06 DIAGNOSIS — E119 Type 2 diabetes mellitus without complications: Secondary | ICD-10-CM

## 2012-01-06 DIAGNOSIS — R600 Localized edema: Secondary | ICD-10-CM

## 2012-01-06 DIAGNOSIS — I10 Essential (primary) hypertension: Secondary | ICD-10-CM

## 2012-01-06 DIAGNOSIS — J45909 Unspecified asthma, uncomplicated: Secondary | ICD-10-CM | POA: Diagnosis not present

## 2012-01-06 LAB — URINALYSIS, ROUTINE W REFLEX MICROSCOPIC
Hgb urine dipstick: NEGATIVE
Leukocytes, UA: NEGATIVE
Nitrite: NEGATIVE
Total Protein, Urine: NEGATIVE
Urobilinogen, UA: 0.2 (ref 0.0–1.0)

## 2012-01-06 LAB — CBC WITH DIFFERENTIAL/PLATELET
Eosinophils Absolute: 0.6 10*3/uL (ref 0.0–0.7)
Eosinophils Relative: 5.5 % — ABNORMAL HIGH (ref 0.0–5.0)
MCV: 83.6 fl (ref 78.0–100.0)
Monocytes Absolute: 0.9 10*3/uL (ref 0.1–1.0)
Neutrophils Relative %: 49.6 % (ref 43.0–77.0)
Platelets: 224 10*3/uL (ref 150.0–400.0)
WBC: 10.1 10*3/uL (ref 4.5–10.5)

## 2012-01-06 MED ORDER — AMLODIPINE BESYLATE 5 MG PO TABS: 5.0000 mg | ORAL_TABLET | Freq: Every day | ORAL | Status: DC

## 2012-01-06 NOTE — Patient Instructions (Addendum)
Please return for Nurse Visit for flu shot if you change your mind Ok to decrease the amlodipine to 5 mg per day Continue all other medications as before Your EKG was ok today Please go to LAB in the Basement for the blood and/or urine tests to be done today Please go to XRAY in the Basement for the x-ray test You will be contacted regarding the referral for: echocardiogram If not improved next visit, we could consider increased lasix or zaroxyln as needed

## 2012-01-06 NOTE — Assessment & Plan Note (Addendum)
Etiology ? Amlodipine 10 vs venous insuff, vs other such as diast dysfxn, anemia post knee replacements vs other - for decrease amlod to 5 mg qd, routine labs includiing bnp, echo with hx of aortic/mitral insuff mild 2008 ; consider zaroxyln prn; f/u 3 wks, ECG reviewed as per emr
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
## 2012-01-07 ENCOUNTER — Encounter: Payer: Self-pay | Admitting: Internal Medicine

## 2012-01-07 DIAGNOSIS — M5137 Other intervertebral disc degeneration, lumbosacral region: Secondary | ICD-10-CM | POA: Diagnosis not present

## 2012-01-07 LAB — BASIC METABOLIC PANEL WITH GFR
BUN: 8 mg/dL (ref 6–23)
CO2: 31 meq/L (ref 19–32)
Calcium: 9.5 mg/dL (ref 8.4–10.5)
Chloride: 105 meq/L (ref 96–112)
Creatinine, Ser: 0.6 mg/dL (ref 0.4–1.2)
GFR: 119.22 mL/min
Glucose, Bld: 63 mg/dL — ABNORMAL LOW (ref 70–99)
Potassium: 4.1 meq/L (ref 3.5–5.1)
Sodium: 142 meq/L (ref 135–145)

## 2012-01-07 LAB — BRAIN NATRIURETIC PEPTIDE: Pro B Natriuretic peptide (BNP): 22 pg/mL (ref 0.0–100.0)

## 2012-01-07 LAB — SEDIMENTATION RATE: Sed Rate: 17 mm/h (ref 0–22)

## 2012-01-07 LAB — HEPATIC FUNCTION PANEL
ALT: 25 U/L (ref 0–35)
AST: 21 U/L (ref 0–37)
Total Bilirubin: 0.3 mg/dL (ref 0.3–1.2)

## 2012-01-10 ENCOUNTER — Encounter: Payer: Self-pay | Admitting: Internal Medicine

## 2012-01-10 ENCOUNTER — Ambulatory Visit (HOSPITAL_COMMUNITY): Payer: Medicare Other | Attending: Cardiology | Admitting: Radiology

## 2012-01-10 DIAGNOSIS — I369 Nonrheumatic tricuspid valve disorder, unspecified: Secondary | ICD-10-CM | POA: Diagnosis not present

## 2012-01-10 DIAGNOSIS — R011 Cardiac murmur, unspecified: Secondary | ICD-10-CM | POA: Diagnosis not present

## 2012-01-10 DIAGNOSIS — I059 Rheumatic mitral valve disease, unspecified: Secondary | ICD-10-CM | POA: Insufficient documentation

## 2012-01-10 DIAGNOSIS — I10 Essential (primary) hypertension: Secondary | ICD-10-CM | POA: Diagnosis not present

## 2012-01-10 DIAGNOSIS — E119 Type 2 diabetes mellitus without complications: Secondary | ICD-10-CM | POA: Diagnosis not present

## 2012-01-10 DIAGNOSIS — R609 Edema, unspecified: Secondary | ICD-10-CM

## 2012-01-10 NOTE — Progress Notes (Signed)
Echocardiogram performed.  

## 2012-01-12 ENCOUNTER — Encounter: Payer: Self-pay | Admitting: Internal Medicine

## 2012-01-12 NOTE — Assessment & Plan Note (Signed)
stable overall by hx and exam, most recent data reviewed with pt, and pt to continue medical treatment as before Lab Results  Component Value Date   HGBA1C 6.3 08/06/2011

## 2012-01-12 NOTE — Assessment & Plan Note (Signed)
stable overall by hx and exam, most recent data reviewed with pt, and pt to continue medical treatment as before BP Readings from Last 3 Encounters:  01/06/12 132/88  08/06/11 122/68  02/05/11 120/78

## 2012-01-12 NOTE — Assessment & Plan Note (Signed)
stable overall by hx and exam, most recent data reviewed with pt, and pt to continue medical treatment as before Lab Results  Component Value Date   LDLCALC 81 08/06/2011

## 2012-01-21 DIAGNOSIS — M5137 Other intervertebral disc degeneration, lumbosacral region: Secondary | ICD-10-CM | POA: Diagnosis not present

## 2012-01-23 DIAGNOSIS — M5137 Other intervertebral disc degeneration, lumbosacral region: Secondary | ICD-10-CM | POA: Diagnosis not present

## 2012-01-24 DIAGNOSIS — M1610 Unilateral primary osteoarthritis, unspecified hip: Secondary | ICD-10-CM | POA: Diagnosis not present

## 2012-01-24 DIAGNOSIS — M169 Osteoarthritis of hip, unspecified: Secondary | ICD-10-CM | POA: Diagnosis not present

## 2012-01-24 DIAGNOSIS — IMO0002 Reserved for concepts with insufficient information to code with codable children: Secondary | ICD-10-CM | POA: Diagnosis not present

## 2012-01-24 DIAGNOSIS — M1710 Unilateral primary osteoarthritis, unspecified knee: Secondary | ICD-10-CM | POA: Diagnosis not present

## 2012-01-27 ENCOUNTER — Ambulatory Visit: Payer: Medicare Other | Admitting: Internal Medicine

## 2012-01-27 DIAGNOSIS — Z0289 Encounter for other administrative examinations: Secondary | ICD-10-CM

## 2012-01-28 ENCOUNTER — Encounter: Payer: Self-pay | Admitting: Internal Medicine

## 2012-01-28 ENCOUNTER — Ambulatory Visit (INDEPENDENT_AMBULATORY_CARE_PROVIDER_SITE_OTHER): Payer: Medicare Other | Admitting: Internal Medicine

## 2012-01-28 VITALS — BP 140/80 | HR 76 | Temp 98.2°F | Ht 62.0 in | Wt 251.4 lb

## 2012-01-28 DIAGNOSIS — E119 Type 2 diabetes mellitus without complications: Secondary | ICD-10-CM

## 2012-01-28 DIAGNOSIS — I10 Essential (primary) hypertension: Secondary | ICD-10-CM

## 2012-01-28 DIAGNOSIS — R609 Edema, unspecified: Secondary | ICD-10-CM | POA: Diagnosis not present

## 2012-01-28 NOTE — Patient Instructions (Addendum)
It appears your swelling was due to the high dose amlodipine and possibly venous insufficiency Please continue the same medications for now Please also elevate your legs when sitting, follow a lower salt diet, try to lose weight, and consider wearing compression stockings to both legs (these can be obtained at the [facility] on [address]) Please return in 6 months, or sooner if needed

## 2012-01-28 NOTE — Assessment & Plan Note (Signed)
With mild LVH, I think needs beta blocker for better control but pt declines due to hx of bradycardia with comb cardizem/clonidine approx 2004 for which she was hospd
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
## 2012-01-28 NOTE — Assessment & Plan Note (Signed)
stable overall by hx and exam, most recent data reviewed with pt, and pt to continue medical treatment as before Lab Results  Component Value Date   HGBA1C 6.3 08/06/2011    

## 2012-01-28 NOTE — Assessment & Plan Note (Signed)
Improved, likely  Amlodipine +/- venous insuff or both, most recent data reviewed with pt, and pt to continue medical treatment as before Lab Results  Component Value Date   WBC 10.1 01/06/2012   HGB 13.4 01/06/2012   HCT 42.5 01/06/2012   PLT 224.0 01/06/2012   GLUCOSE 63* 01/06/2012   CHOL 164 08/06/2011   TRIG 63.0 08/06/2011   HDL 70.50 08/06/2011   LDLCALC 81 08/06/2011   ALT 25 01/06/2012   AST 21 01/06/2012   NA 142 01/06/2012   K 4.1 01/06/2012   CL 105 01/06/2012   CREATININE 0.6 01/06/2012   BUN 8 01/06/2012   CO2 31 01/06/2012   TSH 0.82 08/06/2011   INR 1.02 11/15/2010   HGBA1C 6.3 08/06/2011   MICROALBUR 2.3* 09/03/2010

## 2012-01-29 DIAGNOSIS — Z0279 Encounter for issue of other medical certificate: Secondary | ICD-10-CM

## 2012-02-05 ENCOUNTER — Ambulatory Visit: Payer: Medicare Other | Admitting: Internal Medicine

## 2012-02-18 DIAGNOSIS — M47817 Spondylosis without myelopathy or radiculopathy, lumbosacral region: Secondary | ICD-10-CM | POA: Diagnosis not present

## 2012-04-03 ENCOUNTER — Other Ambulatory Visit: Payer: Self-pay

## 2012-04-03 MED ORDER — AMLODIPINE BESYLATE 5 MG PO TABS: 5.0000 mg | ORAL_TABLET | Freq: Every day | ORAL | Status: DC

## 2012-05-08 ENCOUNTER — Other Ambulatory Visit: Payer: Self-pay | Admitting: Internal Medicine

## 2012-05-14 ENCOUNTER — Other Ambulatory Visit: Payer: Self-pay | Admitting: Neurosurgery

## 2012-05-14 DIAGNOSIS — M5412 Radiculopathy, cervical region: Secondary | ICD-10-CM | POA: Diagnosis not present

## 2012-05-14 DIAGNOSIS — M4802 Spinal stenosis, cervical region: Secondary | ICD-10-CM | POA: Diagnosis not present

## 2012-05-14 DIAGNOSIS — M542 Cervicalgia: Secondary | ICD-10-CM

## 2012-05-18 ENCOUNTER — Ambulatory Visit
Admission: RE | Admit: 2012-05-18 | Discharge: 2012-05-18 | Disposition: A | Discharge status: Home / Self Care | Payer: Medicare Other | Source: Ambulatory Visit | Attending: Neurosurgery | Admitting: Neurosurgery

## 2012-05-18 DIAGNOSIS — M542 Cervicalgia: Secondary | ICD-10-CM

## 2012-05-18 DIAGNOSIS — M5020 Other cervical disc displacement, unspecified cervical region: Secondary | ICD-10-CM | POA: Diagnosis not present

## 2012-05-18 DIAGNOSIS — M4802 Spinal stenosis, cervical region: Secondary | ICD-10-CM | POA: Diagnosis not present

## 2012-05-18 DIAGNOSIS — M47812 Spondylosis without myelopathy or radiculopathy, cervical region: Secondary | ICD-10-CM | POA: Diagnosis not present

## 2012-05-28 DIAGNOSIS — G5620 Lesion of ulnar nerve, unspecified upper limb: Secondary | ICD-10-CM | POA: Diagnosis not present

## 2012-05-28 DIAGNOSIS — G5600 Carpal tunnel syndrome, unspecified upper limb: Secondary | ICD-10-CM | POA: Diagnosis not present

## 2012-06-02 DIAGNOSIS — G5600 Carpal tunnel syndrome, unspecified upper limb: Secondary | ICD-10-CM | POA: Diagnosis not present

## 2012-06-04 DIAGNOSIS — M25819 Other specified joint disorders, unspecified shoulder: Secondary | ICD-10-CM | POA: Diagnosis not present

## 2012-06-09 DIAGNOSIS — G5600 Carpal tunnel syndrome, unspecified upper limb: Secondary | ICD-10-CM | POA: Diagnosis not present

## 2012-06-29 DIAGNOSIS — M25819 Other specified joint disorders, unspecified shoulder: Secondary | ICD-10-CM | POA: Diagnosis not present

## 2012-07-04 DIAGNOSIS — M25819 Other specified joint disorders, unspecified shoulder: Secondary | ICD-10-CM | POA: Diagnosis not present

## 2012-07-10 DIAGNOSIS — M19019 Primary osteoarthritis, unspecified shoulder: Secondary | ICD-10-CM | POA: Diagnosis not present

## 2012-07-10 DIAGNOSIS — S43429A Sprain of unspecified rotator cuff capsule, initial encounter: Secondary | ICD-10-CM | POA: Diagnosis not present

## 2012-07-20 ENCOUNTER — Encounter: Payer: Self-pay | Admitting: Internal Medicine

## 2012-07-20 ENCOUNTER — Ambulatory Visit (INDEPENDENT_AMBULATORY_CARE_PROVIDER_SITE_OTHER): Payer: Medicare Other | Admitting: Internal Medicine

## 2012-07-20 VITALS — BP 122/80 | HR 89 | Temp 98.2°F

## 2012-07-20 DIAGNOSIS — J069 Acute upper respiratory infection, unspecified: Secondary | ICD-10-CM | POA: Diagnosis not present

## 2012-07-20 MED ORDER — AZITHROMYCIN 250 MG PO TABS: ORAL_TABLET | ORAL | Status: DC

## 2012-07-20 NOTE — Progress Notes (Signed)
HPI  Pt presents to the clinic today with c/o cold symptoms x 2 days. The worst part is the sore throat and dry cough. He does not produce any sputum.She has been running fevers. She has tried Robitussin, Mucinex, cough drops and nothing seems to help. She does have a history of allergies and asthma. She does have sick contacts.  Review of Systems      Past Medical History  Diagnosis Date  . DIABETES MELLITUS, TYPE II 08/24/2007  . HYPERCHOLESTEROLEMIA 08/24/2007  . HYPERLIPIDEMIA 08/24/2007  . OBESITY 08/24/2007  . ANXIETY 08/24/2007  . DEPRESSION 08/24/2007  . HYPERTENSION 08/24/2007  . ALLERGIC RHINITIS 08/24/2007  . ASTHMA 08/24/2007  . ASTHMA, WITH ACUTE EXACERBATION 03/14/2008  . GERD 08/24/2007  . POSTMENOPAUSAL STATUS 08/24/2007  . ECZEMA 08/24/2007  . DEGENERATIVE JOINT DISEASE 08/24/2007  . 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 SPINAL STENOSIS 08/24/2007  . Edema 08/24/2007  . Abdominal pain, left lower quadrant 09/12/2008  . Right knee DJD 09/03/2010    Family History  Problem Relation Age of Onset  . Diabetes Other   . Hypertension Other   . Stroke Other   . Colon polyps Other     History   Social History  . Marital Status: Single    Spouse Name: N/A    Number of Children: 2  . 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 Smoking status: Never Smoker   . Smokeless tobacco: Not on file  . Alcohol Use: No  . Drug Use: No  . Sexually Active: Not on file   Other Topics Concern  . Not on file   Social History Narrative  . No narrative on file    Allergies  Allergen Reactions  . Clonidine Hydrochloride     REACTION: Bradycardia  . Hydrocodone-Acetaminophen     REACTION: Nausea  . Rosiglitazone Maleate     REACTION: swelling     Constitutional: Positive headache, fatigue and fever. Denies abrupt weight changes.  HEENT:  Positive sore throat. Denies eye redness,  eye pain, pressure behind the eyes, facial pain, nasal congestion, ear pain, ringing in the ears, wax buildup, runny nose or bloody nose. Respiratory: Positive cough. Denies difficulty breathing or shortness of breath.  Cardiovascular: Denies chest pain, chest tightness, palpitations or swelling in the hands or feet.   No other specific complaints in a complete review of systems (except as listed in HPI above).  Objective:   BP 122/80  Pulse 89  Temp(Src) 98.2 F (36.8 C) (Oral)  SpO2 95% Wt Readings from Last 3 Encounters:  01/28/12 251 lb 6 oz (114.023 kg)  01/06/12 252 lb (114.306 kg)  08/06/11 240 lb 2 oz (108.92 kg)     General: Appears her stated age, well developed, well nourished in NAD. HEENT: Head: normal shape and size; Eyes: sclera white, no icterus, conjunctiva pink, PERRLA and EOMs intact; Ears: Tm's gray and intact, normal light reflex; Nose: mucosa pink and moist, septum midline; Throat/Mouth: + PND. Teeth present, mucosa erythematous and moist, no exudate noted, no lesions or ulcerations noted.  Neck: Mild cervical lymphadenopathy. Neck supple, trachea midline. No massses, lumps or thyromegaly present.  Cardiovascular: Normal rate and rhythm. S1,S2 noted.  No murmur, rubs or gallops noted. No JVD or BLE edema. No carotid bruits noted. Pulmonary/Chest: Normal effort and positive vesicular breath sounds. No respiratory distress. No  wheezes, rales or ronchi noted.      Assessment & Plan:   Upper Respiratory Infection, new onset:  Get some rest and drink plenty of water Do salt water gargles for the sore throat eRx for Azithromax x 5 days   RTC as needed or if symptoms persist.

## 2012-07-20 NOTE — Patient Instructions (Signed)

## 2012-07-22 ENCOUNTER — Telehealth: Payer: Self-pay | Admitting: Internal Medicine

## 2012-07-22 ENCOUNTER — Encounter: Payer: Self-pay | Admitting: Internal Medicine

## 2012-07-22 ENCOUNTER — Ambulatory Visit (INDEPENDENT_AMBULATORY_CARE_PROVIDER_SITE_OTHER): Payer: Medicare Other | Admitting: Internal Medicine

## 2012-07-22 VITALS — BP 112/80 | HR 94 | Temp 97.8°F | Ht 62.0 in | Wt 245.0 lb

## 2012-07-22 DIAGNOSIS — I10 Essential (primary) hypertension: Secondary | ICD-10-CM

## 2012-07-22 DIAGNOSIS — J209 Acute bronchitis, unspecified: Secondary | ICD-10-CM | POA: Diagnosis not present

## 2012-07-22 DIAGNOSIS — E119 Type 2 diabetes mellitus without complications: Secondary | ICD-10-CM

## 2012-07-22 DIAGNOSIS — R062 Wheezing: Secondary | ICD-10-CM

## 2012-07-22 MED ORDER — PREDNISONE 10 MG PO TABS: ORAL_TABLET | ORAL | Status: DC

## 2012-07-22 MED ORDER — HYDROCODONE-HOMATROPINE 5-1.5 MG/5ML PO SYRP: 5.0000 mL | ORAL_SOLUTION | Freq: Four times a day (QID) | ORAL | Status: DC | PRN

## 2012-07-22 MED ORDER — METHYLPREDNISOLONE ACETATE 80 MG/ML IJ SUSP
80.0000 mg | Freq: Once | INTRAMUSCULAR | Status: AC
Start: 2012-07-22 — End: 2012-07-22
  Administered 2012-07-22: 80 mg via INTRAMUSCULAR

## 2012-07-22 MED ORDER — LEVOFLOXACIN 250 MG PO TABS: 250.0000 mg | ORAL_TABLET | Freq: Every day | ORAL | Status: DC

## 2012-07-22 NOTE — Assessment & Plan Note (Signed)
Mild to mod, for antibx course,  to f/u any worsening symptoms or concerns 

## 2012-07-22 NOTE — Assessment & Plan Note (Signed)
stable overall by history and exam, recent data reviewed with pt, and pt to continue medical treatment as before,  to f/u any worsening symptoms or concerns BP Readings from Last 3 Encounters:  07/22/12 112/80  07/20/12 122/80  01/28/12 140/80

## 2012-07-22 NOTE — Assessment & Plan Note (Signed)
stable overall by history and exam, recent data reviewed with pt, and pt to continue medical treatment as before,  to f/u any worsening symptoms or concerns Lab Results  Component Value Date   HGBA1C 6.3 08/06/2011    

## 2012-07-22 NOTE — Assessment & Plan Note (Signed)
Mild to mod, for predpack asd,  to f/u any worsening symptoms or concerns 

## 2012-07-22 NOTE — Patient Instructions (Addendum)
You had the steroid shot today OK to stop the zpack Please take all new medication as prescribed - the antibiotic, cough med, and prednisone Thank you for enrolling in MyChart. Please follow the instructions below to securely access your online medical record. MyChart allows you to send messages to your doctor, view your test results, renew your prescriptions, schedule appointments, and more. To Log into My Chart online, please go by [employer] or [employer] to [email or web address], or download the MyChart App from the [employer] of [employer] .  Your Username is: [email or web address] (pass [phone]) Please send a practice Message on Mychart later today.

## 2012-07-22 NOTE — Telephone Encounter (Signed)
Patient Information:  Caller Name: Jarrett Ables  Phone: [phone]  Patient: Denise [name], Denise [name]  Gender: Female  DOB: November 24, 1945  Age: 67 Years  PCP: Oliver Barre (Adults only)  Office Follow Up:  Does the office need to follow up with this patient?: No  Instructions For The Office: N/A   Symptoms  Reason For Call & Symptoms: Tasha was seen by  Nicki Reaper  on 07/20/12 for fever.  States a Zpak was ordered. Had diarrhea on 3/24 PM but none since then. No appetite. Had onset of hacking cough on 3/24 after seen in office.  Productive cough yellow sputum. Sweaty at times. Chest feels tight. Not using Albuterol inhaler q4h prn. Uses Albuterol when she uses Advair inhaler.  Reviewed Health History In EMR: Yes  Reviewed Medications In EMR: Yes  Reviewed Allergies In EMR: Yes  Reviewed Surgeries / Procedures: Yes  Date of Onset of Symptoms: 07/18/2012  Treatments Tried: Ibuprofen, Zpak  Treatments Tried Worked: No  Any Fever: Yes  Fever Taken: Oral  Fever Time Of Reading: 11:00:00  Fever Last Reading: 99.8  Guideline(s) Used:  Cough  Disposition Per Guideline:   See Today in Office  Reason For Disposition Reached:   Severe coughing spells (e.g., whooping sound after coughing, vomiting after coughing)  Advice Given:  Reassurance  You can get a dry hacking cough after a chest cold. Sometimes this type of cough can last 1-3 weeks, and be worse at night.  Patient Will Follow Care Advice:  YES  Appointment Scheduled:  07/22/2012 14:15:00 Appointment Scheduled Provider:  N/A
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
## 2012-07-23 DIAGNOSIS — M5126 Other intervertebral disc displacement, lumbar region: Secondary | ICD-10-CM | POA: Diagnosis not present

## 2012-07-23 DIAGNOSIS — Z006 Encounter for examination for normal comparison and control in clinical research program: Secondary | ICD-10-CM | POA: Diagnosis not present

## 2012-07-23 DIAGNOSIS — M48061 Spinal stenosis, lumbar region without neurogenic claudication: Secondary | ICD-10-CM | POA: Diagnosis not present

## 2012-07-23 DIAGNOSIS — IMO0002 Reserved for concepts with insufficient information to code with codable children: Secondary | ICD-10-CM | POA: Diagnosis not present

## 2012-07-24 DIAGNOSIS — S43429A Sprain of unspecified rotator cuff capsule, initial encounter: Secondary | ICD-10-CM | POA: Diagnosis not present

## 2012-07-29 ENCOUNTER — Encounter: Payer: Self-pay | Admitting: Internal Medicine

## 2012-07-29 ENCOUNTER — Ambulatory Visit (INDEPENDENT_AMBULATORY_CARE_PROVIDER_SITE_OTHER): Payer: Medicare Other | Admitting: Internal Medicine

## 2012-07-29 VITALS — BP 130/80 | HR 70 | Temp 98.5°F | Ht 62.0 in | Wt 254.5 lb

## 2012-07-29 DIAGNOSIS — E785 Hyperlipidemia, unspecified: Secondary | ICD-10-CM

## 2012-07-29 DIAGNOSIS — Z23 Encounter for immunization: Secondary | ICD-10-CM

## 2012-07-29 DIAGNOSIS — I10 Essential (primary) hypertension: Secondary | ICD-10-CM | POA: Diagnosis not present

## 2012-07-29 DIAGNOSIS — R062 Wheezing: Secondary | ICD-10-CM

## 2012-07-29 DIAGNOSIS — J209 Acute bronchitis, unspecified: Secondary | ICD-10-CM

## 2012-07-29 DIAGNOSIS — Z0000 Encounter for general adult medical examination without abnormal findings: Secondary | ICD-10-CM

## 2012-07-29 DIAGNOSIS — E119 Type 2 diabetes mellitus without complications: Secondary | ICD-10-CM | POA: Diagnosis not present

## 2012-07-29 DIAGNOSIS — S43429A Sprain of unspecified rotator cuff capsule, initial encounter: Secondary | ICD-10-CM | POA: Diagnosis not present

## 2012-07-29 MED ORDER — METHYLPREDNISOLONE ACETATE 80 MG/ML IJ SUSP
80.0000 mg | Freq: Once | INTRAMUSCULAR | Status: AC
  Administered 2012-07-29: 80 mg via INTRAMUSCULAR

## 2012-07-29 NOTE — Assessment & Plan Note (Signed)
stable overall by history and exam, recent data reviewed with pt, and pt to continue medical treatment as before,  to f/u any worsening symptoms or concerns Lab Results  Component Value Date   HGBA1C 6.3 08/06/2011

## 2012-07-29 NOTE — Assessment & Plan Note (Signed)
Likewise improved, for repeat depomedrol IM, finish predpack
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
## 2012-07-29 NOTE — Assessment & Plan Note (Signed)
Not charged but due for pneumovax

## 2012-07-29 NOTE — Assessment & Plan Note (Signed)
stable overall by history and exam, recent data reviewed with pt, and pt to continue medical treatment as before,  to f/u any worsening symptoms or concerns BP Readings from Last 3 Encounters:  07/29/12 130/80  07/22/12 112/80  07/20/12 122/80

## 2012-07-29 NOTE — Assessment & Plan Note (Signed)
Improved,  to f/u any worsening symptoms or concerns, pt reassured

## 2012-07-29 NOTE — Assessment & Plan Note (Signed)
stable overall by history and exam, recent data reviewed with pt, and pt to continue medical treatment as before,  to f/u any worsening symptoms or concerns Lab Results  Component Value Date   LDLCALC 81 08/06/2011

## 2012-07-29 NOTE — Patient Instructions (Signed)
You had the steroid shot, and the pneumonia shot today Please continue all other medications as before (and finish the levaquin and prednisone) Please go to the LAB in the Basement (turn left off the elevator) for the tests to be done today You will be contacted by phone if any changes need to be made immediately.  Otherwise, you will receive a letter about your results with an explanation, but please check with MyChart first. Thank you for enrolling in MyChart. Please follow the instructions below to securely access your online medical record. MyChart allows you to send messages to your doctor, view your test results, renew your prescriptions, schedule appointments, and more. Please return in 6 months, or sooner if needed

## 2012-07-31 ENCOUNTER — Other Ambulatory Visit (INDEPENDENT_AMBULATORY_CARE_PROVIDER_SITE_OTHER): Payer: Medicare Other

## 2012-07-31 DIAGNOSIS — E119 Type 2 diabetes mellitus without complications: Secondary | ICD-10-CM | POA: Diagnosis not present

## 2012-07-31 DIAGNOSIS — S43429A Sprain of unspecified rotator cuff capsule, initial encounter: Secondary | ICD-10-CM | POA: Diagnosis not present

## 2012-07-31 LAB — BASIC METABOLIC PANEL
Calcium: 9.2 mg/dL (ref 8.4–10.5)
GFR: 109.12 mL/min (ref >60.00)
Glucose, Bld: 94 mg/dL (ref 70–99)
Sodium: 140 mEq/L (ref 135–145)

## 2012-07-31 LAB — HEPATIC FUNCTION PANEL
Albumin: 3.9 g/dL (ref 3.5–5.2)
Alkaline Phosphatase: 54 U/L (ref 39–117)
Total Bilirubin: 1 mg/dL (ref 0.3–1.2)

## 2012-07-31 LAB — LIPID PANEL
HDL: 75.7 mg/dL (ref >39.00)
LDL Cholesterol: 75 mg/dL (ref 0–99)
Total CHOL/HDL Ratio: 2
VLDL: 22.8 mg/dL (ref 0.0–40.0)

## 2012-08-05 DIAGNOSIS — S43429A Sprain of unspecified rotator cuff capsule, initial encounter: Secondary | ICD-10-CM | POA: Diagnosis not present

## 2012-08-06 ENCOUNTER — Telehealth: Payer: Self-pay

## 2012-08-06 ENCOUNTER — Encounter: Payer: Self-pay | Admitting: Internal Medicine

## 2012-08-06 ENCOUNTER — Other Ambulatory Visit: Payer: Self-pay | Admitting: Internal Medicine

## 2012-08-06 MED ORDER — POTASSIUM CHLORIDE ER 10 MEQ PO CPCR: ORAL_CAPSULE | ORAL | Status: DC

## 2012-08-06 MED ORDER — GLUCOSE BLOOD VI STRP: ORAL_STRIP | Status: DC

## 2012-08-06 MED ORDER — CETIRIZINE HCL 10 MG PO TABS: 10.0000 mg | ORAL_TABLET | Freq: Every day | ORAL | Status: DC | PRN

## 2012-08-06 MED ORDER — AMLODIPINE BESYLATE 5 MG PO TABS: 5.0000 mg | ORAL_TABLET | Freq: Every day | ORAL | Status: DC

## 2012-08-06 MED ORDER — LISINOPRIL 40 MG PO TABS: 40.0000 mg | ORAL_TABLET | Freq: Every day | ORAL | Status: DC

## 2012-08-06 MED ORDER — GLIMEPIRIDE 2 MG PO TABS: 2.0000 mg | ORAL_TABLET | Freq: Every day | ORAL | Status: DC

## 2012-08-06 MED ORDER — FUROSEMIDE 40 MG PO TABS: 40.0000 mg | ORAL_TABLET | Freq: Two times a day (BID) | ORAL | Status: DC

## 2012-08-06 MED ORDER — METFORMIN HCL ER 500 MG PO TB24: 500.0000 mg | ORAL_TABLET | Freq: Four times a day (QID) | ORAL | Status: DC

## 2012-08-06 MED ORDER — ATORVASTATIN CALCIUM 40 MG PO TABS: 40.0000 mg | ORAL_TABLET | Freq: Every day | ORAL | Status: DC

## 2012-08-06 NOTE — Telephone Encounter (Signed)
Medications refilled as requested by the patient.

## 2012-08-07 DIAGNOSIS — S43429A Sprain of unspecified rotator cuff capsule, initial encounter: Secondary | ICD-10-CM | POA: Diagnosis not present

## 2012-08-10 DIAGNOSIS — S43429A Sprain of unspecified rotator cuff capsule, initial encounter: Secondary | ICD-10-CM | POA: Diagnosis not present

## 2012-08-19 DIAGNOSIS — S43429A Sprain of unspecified rotator cuff capsule, initial encounter: Secondary | ICD-10-CM | POA: Diagnosis not present

## 2012-08-21 DIAGNOSIS — S43429A Sprain of unspecified rotator cuff capsule, initial encounter: Secondary | ICD-10-CM | POA: Diagnosis not present

## 2012-09-22 ENCOUNTER — Ambulatory Visit (INDEPENDENT_AMBULATORY_CARE_PROVIDER_SITE_OTHER): Payer: Medicare Other | Admitting: Internal Medicine

## 2012-09-22 ENCOUNTER — Encounter: Payer: Self-pay | Admitting: Internal Medicine

## 2012-09-22 VITALS — BP 142/80 | HR 84 | Temp 99.7°F | Ht 62.0 in | Wt 259.0 lb

## 2012-09-22 DIAGNOSIS — J019 Acute sinusitis, unspecified: Secondary | ICD-10-CM | POA: Insufficient documentation

## 2012-09-22 DIAGNOSIS — I10 Essential (primary) hypertension: Secondary | ICD-10-CM

## 2012-09-22 DIAGNOSIS — E119 Type 2 diabetes mellitus without complications: Secondary | ICD-10-CM | POA: Diagnosis not present

## 2012-09-22 MED ORDER — LEVOFLOXACIN 250 MG PO TABS: 250.0000 mg | ORAL_TABLET | Freq: Every day | ORAL | Status: DC

## 2012-09-22 NOTE — Patient Instructions (Addendum)
Please take all new medication as prescribed Please continue all other medications as before, and refills have been done if requested. You can also take Delsym OTC for cough, and/or Mucinex (or it's generic off brand) for congestion, and tylenol as needed for pain.
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
## 2012-09-22 NOTE — Assessment & Plan Note (Signed)
Mild elev today likely situational, o/w stable overall by history and exam, recent data reviewed with pt, and pt to continue medical treatment as before,  to f/u any worsening symptoms or concerns BP Readings from Last 3 Encounters:  09/22/12 142/80  07/29/12 130/80  07/22/12 112/80

## 2012-09-22 NOTE — Assessment & Plan Note (Signed)
stable overall by history and exam, recent data reviewed with pt, and pt to continue medical treatment as before,  to f/u any worsening symptoms or concerns Lab Results  Component Value Date   HGBA1C 7.2* 07/31/2012

## 2012-09-22 NOTE — Assessment & Plan Note (Signed)
Mild to mod, for antibx course,  to f/u any worsening symptoms or concerns 

## 2012-09-29 DIAGNOSIS — M75110 Incomplete rotator cuff tear or rupture of unspecified shoulder, not specified as traumatic: Secondary | ICD-10-CM | POA: Diagnosis not present

## 2012-09-29 DIAGNOSIS — G8918 Other acute postprocedural pain: Secondary | ICD-10-CM | POA: Diagnosis not present

## 2012-09-29 DIAGNOSIS — M25819 Other specified joint disorders, unspecified shoulder: Secondary | ICD-10-CM | POA: Diagnosis not present

## 2012-09-29 DIAGNOSIS — S43439A Superior glenoid labrum lesion of unspecified shoulder, initial encounter: Secondary | ICD-10-CM | POA: Diagnosis not present

## 2012-09-29 DIAGNOSIS — M24119 Other articular cartilage disorders, unspecified shoulder: Secondary | ICD-10-CM | POA: Diagnosis not present

## 2012-09-29 DIAGNOSIS — M9420 Chondromalacia, unspecified site: Secondary | ICD-10-CM | POA: Diagnosis not present

## 2012-09-29 DIAGNOSIS — M19019 Primary osteoarthritis, unspecified shoulder: Secondary | ICD-10-CM | POA: Diagnosis not present

## 2012-10-05 DIAGNOSIS — Z4789 Encounter for other orthopedic aftercare: Secondary | ICD-10-CM | POA: Diagnosis not present

## 2012-10-12 DIAGNOSIS — M25519 Pain in unspecified shoulder: Secondary | ICD-10-CM | POA: Diagnosis not present

## 2012-10-14 DIAGNOSIS — M25519 Pain in unspecified shoulder: Secondary | ICD-10-CM | POA: Diagnosis not present

## 2012-10-19 DIAGNOSIS — M25519 Pain in unspecified shoulder: Secondary | ICD-10-CM | POA: Diagnosis not present

## 2012-10-21 DIAGNOSIS — M25519 Pain in unspecified shoulder: Secondary | ICD-10-CM | POA: Diagnosis not present

## 2012-10-22 DIAGNOSIS — M4120 Other idiopathic scoliosis, site unspecified: Secondary | ICD-10-CM | POA: Diagnosis not present

## 2012-10-22 DIAGNOSIS — M4800 Spinal stenosis, site unspecified: Secondary | ICD-10-CM | POA: Diagnosis not present

## 2012-10-22 DIAGNOSIS — M5126 Other intervertebral disc displacement, lumbar region: Secondary | ICD-10-CM | POA: Diagnosis not present

## 2012-10-26 DIAGNOSIS — M25519 Pain in unspecified shoulder: Secondary | ICD-10-CM | POA: Diagnosis not present

## 2012-10-28 DIAGNOSIS — M25519 Pain in unspecified shoulder: Secondary | ICD-10-CM | POA: Diagnosis not present

## 2012-10-29 DIAGNOSIS — Z4889 Encounter for other specified surgical aftercare: Secondary | ICD-10-CM | POA: Diagnosis not present

## 2012-11-02 DIAGNOSIS — M25519 Pain in unspecified shoulder: Secondary | ICD-10-CM | POA: Diagnosis not present

## 2012-11-04 DIAGNOSIS — M25519 Pain in unspecified shoulder: Secondary | ICD-10-CM | POA: Diagnosis not present

## 2012-11-10 DIAGNOSIS — M25519 Pain in unspecified shoulder: Secondary | ICD-10-CM | POA: Diagnosis not present

## 2012-11-12 DIAGNOSIS — M25519 Pain in unspecified shoulder: Secondary | ICD-10-CM | POA: Diagnosis not present

## 2012-11-16 DIAGNOSIS — M25519 Pain in unspecified shoulder: Secondary | ICD-10-CM | POA: Diagnosis not present

## 2012-11-19 ENCOUNTER — Other Ambulatory Visit: Payer: Self-pay | Admitting: Internal Medicine

## 2012-11-19 DIAGNOSIS — M25519 Pain in unspecified shoulder: Secondary | ICD-10-CM | POA: Diagnosis not present

## 2012-11-23 DIAGNOSIS — M25519 Pain in unspecified shoulder: Secondary | ICD-10-CM | POA: Diagnosis not present

## 2012-11-25 DIAGNOSIS — Z4889 Encounter for other specified surgical aftercare: Secondary | ICD-10-CM | POA: Diagnosis not present

## 2012-11-25 DIAGNOSIS — M25519 Pain in unspecified shoulder: Secondary | ICD-10-CM | POA: Diagnosis not present

## 2012-12-01 DIAGNOSIS — M25519 Pain in unspecified shoulder: Secondary | ICD-10-CM | POA: Diagnosis not present

## 2012-12-04 DIAGNOSIS — M25519 Pain in unspecified shoulder: Secondary | ICD-10-CM | POA: Diagnosis not present

## 2012-12-17 ENCOUNTER — Ambulatory Visit (INDEPENDENT_AMBULATORY_CARE_PROVIDER_SITE_OTHER): Payer: Medicare Other | Admitting: Internal Medicine

## 2012-12-17 ENCOUNTER — Ambulatory Visit (INDEPENDENT_AMBULATORY_CARE_PROVIDER_SITE_OTHER)
Admission: RE | Admit: 2012-12-17 | Discharge: 2012-12-17 | Disposition: A | Discharge status: Home / Self Care | Payer: Medicare Other | Source: Ambulatory Visit | Attending: Internal Medicine | Admitting: Internal Medicine

## 2012-12-17 ENCOUNTER — Encounter: Payer: Self-pay | Admitting: Internal Medicine

## 2012-12-17 VITALS — BP 160/86 | HR 100 | Temp 99.5°F | Ht 62.0 in | Wt 254.5 lb

## 2012-12-17 DIAGNOSIS — I10 Essential (primary) hypertension: Secondary | ICD-10-CM

## 2012-12-17 DIAGNOSIS — R062 Wheezing: Secondary | ICD-10-CM | POA: Diagnosis not present

## 2012-12-17 DIAGNOSIS — R05 Cough: Secondary | ICD-10-CM | POA: Diagnosis not present

## 2012-12-17 DIAGNOSIS — J209 Acute bronchitis, unspecified: Secondary | ICD-10-CM

## 2012-12-17 DIAGNOSIS — E119 Type 2 diabetes mellitus without complications: Secondary | ICD-10-CM

## 2012-12-17 DIAGNOSIS — R059 Cough, unspecified: Secondary | ICD-10-CM | POA: Diagnosis not present

## 2012-12-17 DIAGNOSIS — R0602 Shortness of breath: Secondary | ICD-10-CM | POA: Diagnosis not present

## 2012-12-17 MED ORDER — PREDNISONE 10 MG PO TABS: ORAL_TABLET | ORAL | Status: DC

## 2012-12-17 MED ORDER — HYDROCODONE-HOMATROPINE 5-1.5 MG/5ML PO SYRP: 5.0000 mL | ORAL_SOLUTION | Freq: Four times a day (QID) | ORAL | Status: DC | PRN

## 2012-12-17 MED ORDER — METHYLPREDNISOLONE ACETATE 80 MG/ML IJ SUSP
80.0000 mg | Freq: Once | INTRAMUSCULAR | Status: AC
  Administered 2012-12-17: 80 mg via INTRAMUSCULAR

## 2012-12-17 MED ORDER — LEVOFLOXACIN 250 MG PO TABS: 250.0000 mg | ORAL_TABLET | Freq: Every day | ORAL | Status: DC

## 2012-12-17 NOTE — Patient Instructions (Signed)
You had the steroid shot today Please take all new medication as prescribed - the antibiotic, cough med, and prednisone Please continue all other medications as before, including the inhalers Please go to the XRAY Department in the Basement (go straight as you get off the elevator) for the x-ray testing You will be contacted by phone if any changes need to be made immediately.  Otherwise, you will receive a letter about your results with an explanation, but please check with MyChart first.  Please remember to sign up for My Chart if you have not done so, as this will be important to you in the future with finding out test results, communicating by private email, and scheduling acute appointments online when needed.

## 2012-12-17 NOTE — Assessment & Plan Note (Addendum)
Mild to mod, for antibx course,  to f/u any worsening symptoms or concerns, also for cxr , r/o pna

## 2012-12-17 NOTE — Assessment & Plan Note (Signed)
stable overall by history and exam, recent data reviewed with pt, and pt to continue medical treatment as before,  to f/u any worsening symptoms or concerns Lab Results  Component Value Date   HGBA1C 7.2* 07/31/2012   Pt to call for cbg > 200 with steroid tx

## 2012-12-17 NOTE — Assessment & Plan Note (Signed)
Mild to mod, for depomedrol IM, predpac asd, cough med,  to f/u any worsening symptoms or concerns

## 2012-12-17 NOTE — Assessment & Plan Note (Signed)
stable overall by history and exam, recent data reviewed with pt, and pt to continue medical treatment as before,  to f/u any worsening symptoms or concerns BP Readings from Last 3 Encounters:  12/17/12 160/86  09/22/12 142/80  07/29/12 130/80
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
## 2012-12-22 DIAGNOSIS — M25519 Pain in unspecified shoulder: Secondary | ICD-10-CM | POA: Diagnosis not present

## 2012-12-25 ENCOUNTER — Ambulatory Visit (INDEPENDENT_AMBULATORY_CARE_PROVIDER_SITE_OTHER): Payer: Medicare Other | Admitting: Internal Medicine

## 2012-12-25 ENCOUNTER — Encounter: Payer: Self-pay | Admitting: Internal Medicine

## 2012-12-25 VITALS — BP 128/82 | HR 85 | Temp 98.8°F | Ht 62.0 in | Wt 253.2 lb

## 2012-12-25 DIAGNOSIS — E119 Type 2 diabetes mellitus without complications: Secondary | ICD-10-CM | POA: Diagnosis not present

## 2012-12-25 DIAGNOSIS — J189 Pneumonia, unspecified organism: Secondary | ICD-10-CM | POA: Diagnosis not present

## 2012-12-25 DIAGNOSIS — J209 Acute bronchitis, unspecified: Secondary | ICD-10-CM | POA: Insufficient documentation

## 2012-12-25 DIAGNOSIS — I10 Essential (primary) hypertension: Secondary | ICD-10-CM

## 2012-12-25 MED ORDER — AMOXICILLIN-POT CLAVULANATE 875-125 MG PO TABS: 1.0000 | ORAL_TABLET | Freq: Two times a day (BID) | ORAL | Status: DC

## 2012-12-25 MED ORDER — CEFTRIAXONE SODIUM 1 G IJ SOLR
1.0000 g | Freq: Once | INTRAMUSCULAR | Status: AC
  Administered 2012-12-25: 1 g via INTRAMUSCULAR

## 2012-12-25 NOTE — Assessment & Plan Note (Addendum)
Despite her levaquin , I think her pulm exam has worsened - ? Left pna or effusion, she is adamant she does not want f/u cxr or labs, for now for rocephin IM, and course augmentin asd, d/c levaquin,  to f/u any worsening symptoms or concerns

## 2012-12-25 NOTE — Assessment & Plan Note (Signed)
stable overall by history and exam, recent data reviewed with pt, and pt to continue medical treatment as before,  to f/u any worsening symptoms or concerns Lab Results  Component Value Date   HGBA1C 7.2* 07/31/2012

## 2012-12-25 NOTE — Patient Instructions (Signed)
You had the antibiotic shot today (rocephin) OK to stop the levaquin Please take all new medication as prescribed - the augmentin as prescribed  Please continue all other medications as before, including the cough medicine  No further steroid treatment is needed at this time  Please return if you are worse, if you change your mind about the CXR
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
## 2012-12-25 NOTE — Assessment & Plan Note (Signed)
stable overall by history and exam, recent data reviewed with pt, and pt to continue medical treatment as before,  to f/u any worsening symptoms or concerns BP Readings from Last 3 Encounters:  12/25/12 128/82  12/17/12 160/86  09/22/12 142/80

## 2012-12-31 DIAGNOSIS — M4120 Other idiopathic scoliosis, site unspecified: Secondary | ICD-10-CM | POA: Diagnosis not present

## 2013-01-07 DIAGNOSIS — M4120 Other idiopathic scoliosis, site unspecified: Secondary | ICD-10-CM | POA: Diagnosis not present

## 2013-01-08 ENCOUNTER — Telehealth: Payer: Self-pay | Admitting: Internal Medicine

## 2013-01-08 ENCOUNTER — Other Ambulatory Visit: Payer: Self-pay

## 2013-01-08 DIAGNOSIS — R921 Mammographic calcification found on diagnostic imaging of breast: Secondary | ICD-10-CM

## 2013-01-08 NOTE — Telephone Encounter (Signed)
Patient informed. 

## 2013-01-08 NOTE — Telephone Encounter (Signed)
Pt is aware and will call to set it up.

## 2013-01-08 NOTE — Telephone Encounter (Signed)
No referral needed; ok for pt to call to self-order this test at that location

## 2013-01-08 NOTE — Telephone Encounter (Signed)
Pt sent a message through My Chart for a referral for a mammogram.  She thinks the last one was done on [address] at [facility].

## 2013-01-11 DIAGNOSIS — M25519 Pain in unspecified shoulder: Secondary | ICD-10-CM | POA: Diagnosis not present

## 2013-01-12 DIAGNOSIS — M4120 Other idiopathic scoliosis, site unspecified: Secondary | ICD-10-CM | POA: Diagnosis not present

## 2013-01-15 DIAGNOSIS — M4120 Other idiopathic scoliosis, site unspecified: Secondary | ICD-10-CM | POA: Diagnosis not present

## 2013-01-20 DIAGNOSIS — M4120 Other idiopathic scoliosis, site unspecified: Secondary | ICD-10-CM | POA: Diagnosis not present

## 2013-01-22 ENCOUNTER — Ambulatory Visit
Admission: RE | Admit: 2013-01-22 | Discharge: 2013-01-22 | Disposition: A | Discharge status: Home / Self Care | Payer: Medicare Other | Source: Ambulatory Visit

## 2013-01-22 DIAGNOSIS — R921 Mammographic calcification found on diagnostic imaging of breast: Secondary | ICD-10-CM

## 2013-01-22 DIAGNOSIS — M4120 Other idiopathic scoliosis, site unspecified: Secondary | ICD-10-CM | POA: Diagnosis not present

## 2013-01-22 DIAGNOSIS — R928 Other abnormal and inconclusive findings on diagnostic imaging of breast: Secondary | ICD-10-CM | POA: Diagnosis not present

## 2013-01-22 LAB — HM MAMMOGRAPHY: HM Mammogram: NEGATIVE

## 2013-01-27 DIAGNOSIS — M4120 Other idiopathic scoliosis, site unspecified: Secondary | ICD-10-CM | POA: Diagnosis not present

## 2013-01-28 ENCOUNTER — Encounter: Payer: Self-pay | Admitting: Internal Medicine

## 2013-01-28 ENCOUNTER — Ambulatory Visit (INDEPENDENT_AMBULATORY_CARE_PROVIDER_SITE_OTHER): Payer: Medicare Other | Admitting: Internal Medicine

## 2013-01-28 ENCOUNTER — Other Ambulatory Visit (INDEPENDENT_AMBULATORY_CARE_PROVIDER_SITE_OTHER): Payer: Medicare Other

## 2013-01-28 VITALS — BP 128/88 | HR 86 | Temp 99.4°F | Ht 62.0 in | Wt 255.2 lb

## 2013-01-28 DIAGNOSIS — I10 Essential (primary) hypertension: Secondary | ICD-10-CM

## 2013-01-28 DIAGNOSIS — E785 Hyperlipidemia, unspecified: Secondary | ICD-10-CM

## 2013-01-28 DIAGNOSIS — R5381 Other malaise: Secondary | ICD-10-CM | POA: Insufficient documentation

## 2013-01-28 DIAGNOSIS — E669 Obesity, unspecified: Secondary | ICD-10-CM

## 2013-01-28 DIAGNOSIS — E119 Type 2 diabetes mellitus without complications: Secondary | ICD-10-CM | POA: Diagnosis not present

## 2013-01-28 LAB — URINALYSIS, ROUTINE W REFLEX MICROSCOPIC
Bilirubin Urine: NEGATIVE
Hgb urine dipstick: NEGATIVE
Leukocytes, UA: NEGATIVE
Nitrite: NEGATIVE
Specific Gravity, Urine: 1.015 (ref 1.000–1.030)
Total Protein, Urine: NEGATIVE
pH: 6.5 (ref 5.0–8.0)

## 2013-01-28 LAB — CBC WITH DIFFERENTIAL/PLATELET
Basophils Relative: 0.9 % (ref 0.0–3.0)
Eosinophils Absolute: 0.3 10*3/uL (ref 0.0–0.7)
Eosinophils Relative: 2.2 % (ref 0.0–5.0)
HCT: 40.5 % (ref 36.0–46.0)
Hemoglobin: 13.2 g/dL (ref 12.0–15.0)
Lymphocytes Relative: 31.5 % (ref 12.0–46.0)
Monocytes Relative: 6.8 % (ref 3.0–12.0)
Neutro Abs: 6.9 10*3/uL (ref 1.4–7.7)
Neutrophils Relative %: 58.6 % (ref 43.0–77.0)
RBC: 5.03 Mil/uL (ref 3.87–5.11)
WBC: 11.8 10*3/uL — ABNORMAL HIGH (ref 4.5–10.5)

## 2013-01-28 LAB — MICROALBUMIN / CREATININE URINE RATIO
Creatinine,U: 23.8 mg/dL
Microalb, Ur: 0.7 mg/dL (ref 0.0–1.9)

## 2013-01-28 LAB — HEMOGLOBIN A1C: Hgb A1c MFr Bld: 7.5 % — ABNORMAL HIGH (ref 4.6–6.5)

## 2013-01-28 NOTE — Assessment & Plan Note (Signed)
stable overall by history and exam, recent data reviewed with pt, and pt to continue medical treatment as before,  to f/u any worsening symptoms or concerns BP Readings from Last 3 Encounters:  01/28/13 128/88  12/25/12 128/82  12/17/12 160/86

## 2013-01-28 NOTE — Assessment & Plan Note (Signed)
Etiology unclear, Exam otherwise benign, to check labs as documented, follow with expectant management  

## 2013-01-28 NOTE — Patient Instructions (Signed)
Please continue all other medications as before, and refills have been done if requested. Please have the pharmacy call with any other refills you may need. Please continue your efforts at being more active, low cholesterol diet, and weight control. You are otherwise up to date with prevention measures today. You will be contacted regarding the referral for: general surgury to see about the gastric sleeve surgury Please go to the LAB in the Basement (turn left off the elevator) for the tests to be done today You will be contacted by phone if any changes need to be made immediately.  Otherwise, you will receive a letter about your results with an explanation, but please check with MyChart first.  Please remember to sign up for My Chart if you have not done so, as this will be important to you in the future with finding out test results, communicating by private email, and scheduling acute appointments online when needed.  Please return in 6 months, or sooner if needed

## 2013-01-28 NOTE — Assessment & Plan Note (Signed)
Ok for referral,  to f/u any worsening symptoms or concerns

## 2013-01-28 NOTE — Assessment & Plan Note (Signed)
stable overall by history and exam, recent data reviewed with pt, and pt to continue medical treatment as before,  to f/u any worsening symptoms or concerns Lab Results  Component Value Date   HGBA1C 7.2* 07/31/2012

## 2013-01-28 NOTE — Assessment & Plan Note (Signed)
stable overall by history and exam, recent data reviewed with pt, and pt to continue medical treatment as before,  to f/u any worsening symptoms or concerns Lab Results  Component Value Date   LDLCALC 75 07/31/2012
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
## 2013-01-29 DIAGNOSIS — M4120 Other idiopathic scoliosis, site unspecified: Secondary | ICD-10-CM | POA: Diagnosis not present

## 2013-01-29 LAB — LIPID PANEL
Cholesterol: 160 mg/dL (ref 0–200)
HDL: 65.1 mg/dL (ref >39.00)
Triglycerides: 71 mg/dL (ref 0.0–149.0)

## 2013-01-29 LAB — HEPATIC FUNCTION PANEL
ALT: 24 U/L (ref 0–35)
Albumin: 4 g/dL (ref 3.5–5.2)
Total Protein: 7.4 g/dL (ref 6.0–8.3)

## 2013-01-29 LAB — BASIC METABOLIC PANEL
BUN: 10 mg/dL (ref 6–23)
CO2: 30 mEq/L (ref 19–32)
Chloride: 105 mEq/L (ref 96–112)
Creatinine, Ser: 0.8 mg/dL (ref 0.4–1.2)
GFR: 98.96 mL/min (ref >60.00)

## 2013-01-29 LAB — T4, FREE: Free T4: 0.81 ng/dL (ref 0.60–1.60)

## 2013-02-02 DIAGNOSIS — M4120 Other idiopathic scoliosis, site unspecified: Secondary | ICD-10-CM | POA: Diagnosis not present

## 2013-02-04 DIAGNOSIS — M4120 Other idiopathic scoliosis, site unspecified: Secondary | ICD-10-CM | POA: Diagnosis not present

## 2013-02-09 DIAGNOSIS — M4120 Other idiopathic scoliosis, site unspecified: Secondary | ICD-10-CM | POA: Diagnosis not present

## 2013-02-11 DIAGNOSIS — M5126 Other intervertebral disc displacement, lumbar region: Secondary | ICD-10-CM | POA: Diagnosis not present

## 2013-02-17 ENCOUNTER — Encounter: Payer: Self-pay | Admitting: Family Medicine

## 2013-02-17 ENCOUNTER — Ambulatory Visit (INDEPENDENT_AMBULATORY_CARE_PROVIDER_SITE_OTHER): Payer: Medicare Other | Admitting: Family Medicine

## 2013-02-17 ENCOUNTER — Ambulatory Visit (INDEPENDENT_AMBULATORY_CARE_PROVIDER_SITE_OTHER)
Admission: RE | Admit: 2013-02-17 | Discharge: 2013-02-17 | Disposition: A | Discharge status: Home / Self Care | Payer: Medicare Other | Source: Ambulatory Visit | Attending: Family Medicine | Admitting: Family Medicine

## 2013-02-17 VITALS — BP 148/92 | HR 63

## 2013-02-17 DIAGNOSIS — M79609 Pain in unspecified limb: Secondary | ICD-10-CM | POA: Diagnosis not present

## 2013-02-17 DIAGNOSIS — S93409A Sprain of unspecified ligament of unspecified ankle, initial encounter: Secondary | ICD-10-CM | POA: Insufficient documentation

## 2013-02-17 DIAGNOSIS — M79672 Pain in left foot: Secondary | ICD-10-CM

## 2013-02-17 DIAGNOSIS — M19079 Primary osteoarthritis, unspecified ankle and foot: Secondary | ICD-10-CM | POA: Diagnosis not present

## 2013-02-17 MED ORDER — MELOXICAM 15 MG PO TABS: 15.0000 mg | ORAL_TABLET | Freq: Every day | ORAL | Status: DC

## 2013-02-17 NOTE — Patient Instructions (Signed)
Very nice to meet you I am giving you a brace to try to wear for support.  meloxicam daily for 5 days then as needed.  Ice 20 minutes 2-3 times daily.  Come back and see me again in 1-2 weeks if not better.

## 2013-02-17 NOTE — Assessment & Plan Note (Signed)
Home exercise program given Patient was put in a lace up brace that was applied by me and did have significant decrease in pain. Meloxicam for 5 days then as needed warned the potential side effects Followup in one to 2 weeks if not completely better.

## 2013-02-17 NOTE — Progress Notes (Signed)
  CC:  Left foot pain  HPI: Patient is a very pleasant 67 year old female who is coming in with left ankle pain. Patient states she was at the gym the other day and was doing the exercises and felt like she tweaked the ankle. Patient states she woke up this morning and had significant trouble. Weight on the left ankle. Patient states today it seems to be getting slowly better but she is still ambulating with the aid of a cane. Patient has tried some ibuprofen that did help. Patient describes the pain as a dull aching sensation that can be sharp mostly over the lateral aspect of the ankle. Patient rates the pain 7/10.  Past medical, surgical, family and social history reviewed. Medications reviewed all in the electronic medical record.   Review of Systems: No headache, visual changes, nausea, vomiting, diarrhea, constipation, dizziness, abdominal pain, skin rash, fevers, chills, night sweats, weight loss, swollen lymph nodes, body aches, joint swelling, muscle aches, chest pain, shortness of breath, mood changes.   Objective:    Blood pressure 148/92, pulse 63, SpO2 97.00%.   General: No apparent distress alert and oriented x3 mood and affect normal, dressed appropriately. Severely obese HEENT: Pupils equal, extraocular movements intact Respiratory: Patient's speak in full sentences and does not appear short of breath Cardiovascular: No lower extremity edema, non tender, no erythema Skin: Warm dry intact with no signs of infection or rash on extremities or on axial skeleton. Abdomen: Soft nontender Neuro: Cranial nerves II through XII are intact, neurovascularly intact in all extremities with 2+ DTRs and 2+ pulses. Lymph: No lymphadenopathy of posterior or anterior cervical chain or axillae bilaterally.  Gait normal with good balance and coordination.  MSK: Non tender with full range of motion and good stability and symmetric strength and tone of shoulders, elbows, wrist, hip, knee and   bilaterally.  Ankle: Left No visible erythema or swelling. Patient though does have edema bilaterally but is symmetric  Range of motion is full in all directions. Strength is 5/5 in all directions. Stable lateral and medial ligaments; squeeze test and kleiger test unremarkable; she is tender to palpation over the lateral ankle mostly the ATFL Talar dome nontender; No pain at base of 5th MT; No tenderness over cuboid; No tenderness over N spot or navicular prominence No tenderness on posterior aspects of lateral and medial malleolus No sign of peroneal tendon subluxations or tenderness to palpation Negative tarsal tunnel tinel's Able to walk 4 steps.  X-rays were ordered reviewed and interpreted by me today. X-rays the patient's for unremarkable for any bony abnormality. Patient though does have osteoarthritic changes of the anterior mortise as well as midfoot arthritis. I do not think this is contributing to her pain.  Impression and Recommendations:     This case required medical decision making of moderate complexity.

## 2013-03-03 ENCOUNTER — Ambulatory Visit: Payer: Medicare Other | Admitting: Family Medicine

## 2013-03-04 ENCOUNTER — Other Ambulatory Visit: Payer: Self-pay

## 2013-03-11 ENCOUNTER — Telehealth: Payer: Self-pay | Admitting: Internal Medicine

## 2013-03-11 NOTE — Telephone Encounter (Signed)
Patient Information:  Caller Name: [place]  Phone: [phone]  Patient: Denise [name], Denise [name]  Gender: Female  DOB: Sep 29, 1945  Age: 67 Years  PCP: Oliver Barre (Adults only)  Office Follow Up:  Does the office need to follow up with this patient?: No  Instructions For The Office: N/A  RN Note:  Offered appointment today, 03/11/13, patient refused because she wanted to see  her PCP, Dr. Jonny Ruiz.   Symptoms  Reason For Call & Symptoms: Reports ringing/buzzing in her ears. Requesting appointment.  Reviewed Health History In EMR: Yes  Reviewed Medications In EMR: Yes  Reviewed Allergies In EMR: Yes  Reviewed Surgeries / Procedures: Yes  Date of Onset of Symptoms: 03/09/2013  Guideline(s) Used:  No Protocol Available - Sick Adult  Disposition Per Guideline:   See Today in Office  Reason For Disposition Reached:   Patient wants to be seen  Advice Given:  N/A  Patient Will Follow Care Advice:  YES  Appointment Scheduled:  03/12/2013 09:15:00 Appointment Scheduled Provider:  Oliver Barre (Adults only)

## 2013-03-11 NOTE — Telephone Encounter (Signed)
Called left message to call back 

## 2013-03-11 NOTE — Telephone Encounter (Signed)
Ok to work in today

## 2013-03-12 ENCOUNTER — Encounter: Payer: Self-pay | Admitting: Internal Medicine

## 2013-03-12 ENCOUNTER — Ambulatory Visit (INDEPENDENT_AMBULATORY_CARE_PROVIDER_SITE_OTHER): Payer: Medicare Other | Admitting: Internal Medicine

## 2013-03-12 VITALS — BP 140/90 | HR 81 | Temp 97.5°F | Ht 62.0 in | Wt 253.0 lb

## 2013-03-12 DIAGNOSIS — G4762 Sleep related leg cramps: Secondary | ICD-10-CM

## 2013-03-12 DIAGNOSIS — J019 Acute sinusitis, unspecified: Secondary | ICD-10-CM

## 2013-03-12 DIAGNOSIS — H6980 Other specified disorders of Eustachian tube, unspecified ear: Secondary | ICD-10-CM

## 2013-03-12 DIAGNOSIS — H6981 Other specified disorders of Eustachian tube, right ear: Secondary | ICD-10-CM

## 2013-03-12 DIAGNOSIS — I10 Essential (primary) hypertension: Secondary | ICD-10-CM | POA: Diagnosis not present

## 2013-03-12 MED ORDER — LEVOFLOXACIN 250 MG PO TABS: 250.0000 mg | ORAL_TABLET | Freq: Every day | ORAL | Status: DC

## 2013-03-12 NOTE — Telephone Encounter (Signed)
Appointment with Dr. Jonny Ruiz today 03/12/13.

## 2013-03-12 NOTE — Patient Instructions (Signed)
Please take all new medication as prescribed  Please continue all other medications as before, and refills have been done if requested.  Please have the pharmacy call with any other refills you may need.  You can also take Delsym OTC for cough, and/or Mucinex (or it's generic off brand) for congestion, and tylenol as needed for pain.   

## 2013-03-12 NOTE — Progress Notes (Signed)
Pre-visit discussion using our clinic review tool. No additional management support is needed unless otherwise documented below in the visit note.  

## 2013-03-15 DIAGNOSIS — H6990 Unspecified Eustachian tube disorder, unspecified ear: Secondary | ICD-10-CM | POA: Insufficient documentation

## 2013-03-15 DIAGNOSIS — H6980 Other specified disorders of Eustachian tube, unspecified ear: Secondary | ICD-10-CM | POA: Insufficient documentation

## 2013-03-15 NOTE — Assessment & Plan Note (Signed)
Ok to take robaxin asd,  to f/u any worsening symptoms or concerns

## 2013-03-15 NOTE — Assessment & Plan Note (Signed)
Mild to mod, for antibx course,  to f/u any worsening symptoms or concerns 

## 2013-03-15 NOTE — Assessment & Plan Note (Signed)
stable overall by history and exam, recent data reviewed with pt, and pt to continue medical treatment as before,  to f/u any worsening symptoms or concerns BP Readings from Last 3 Encounters:  03/12/13 140/90  02/17/13 148/92  01/28/13 128/88

## 2013-03-15 NOTE — Assessment & Plan Note (Signed)
For mucinex otc prn,  to f/u any worsening symptoms or concerns  
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
## 2013-04-08 DIAGNOSIS — M4800 Spinal stenosis, site unspecified: Secondary | ICD-10-CM | POA: Diagnosis not present

## 2013-04-08 DIAGNOSIS — E669 Obesity, unspecified: Secondary | ICD-10-CM | POA: Diagnosis not present

## 2013-04-08 DIAGNOSIS — M4120 Other idiopathic scoliosis, site unspecified: Secondary | ICD-10-CM | POA: Diagnosis not present

## 2013-04-08 DIAGNOSIS — I10 Essential (primary) hypertension: Secondary | ICD-10-CM | POA: Diagnosis not present

## 2013-04-29 HISTORY — PX: EYE SURGERY: SHX253

## 2013-06-17 ENCOUNTER — Other Ambulatory Visit: Payer: Self-pay

## 2013-06-17 MED ORDER — FLUTICASONE-SALMETEROL 100-50 MCG/DOSE IN AEPB: 1.0000 | INHALATION_SPRAY | Freq: Two times a day (BID) | RESPIRATORY_TRACT | Status: DC

## 2013-06-22 ENCOUNTER — Other Ambulatory Visit: Payer: Self-pay

## 2013-06-22 MED ORDER — POTASSIUM CHLORIDE ER 10 MEQ PO CPCR: ORAL_CAPSULE | ORAL | Status: DC

## 2013-07-01 DIAGNOSIS — M5126 Other intervertebral disc displacement, lumbar region: Secondary | ICD-10-CM | POA: Diagnosis not present

## 2013-07-01 DIAGNOSIS — M4120 Other idiopathic scoliosis, site unspecified: Secondary | ICD-10-CM | POA: Diagnosis not present

## 2013-07-06 ENCOUNTER — Other Ambulatory Visit: Payer: Self-pay

## 2013-07-06 MED ORDER — CETIRIZINE HCL 10 MG PO TABS: 10.0000 mg | ORAL_TABLET | Freq: Every day | ORAL | Status: DC | PRN

## 2013-07-27 ENCOUNTER — Other Ambulatory Visit: Payer: Self-pay | Admitting: Internal Medicine

## 2013-07-27 MED ORDER — LEVALBUTEROL TARTRATE 45 MCG/ACT IN AERO: 2.0000 | INHALATION_SPRAY | Freq: Every day | RESPIRATORY_TRACT | Status: DC

## 2013-08-03 ENCOUNTER — Ambulatory Visit: Payer: Medicare Other | Admitting: Internal Medicine

## 2013-08-06 ENCOUNTER — Other Ambulatory Visit (INDEPENDENT_AMBULATORY_CARE_PROVIDER_SITE_OTHER): Payer: Medicare Other

## 2013-08-06 ENCOUNTER — Encounter: Payer: Self-pay | Admitting: Internal Medicine

## 2013-08-06 ENCOUNTER — Ambulatory Visit (INDEPENDENT_AMBULATORY_CARE_PROVIDER_SITE_OTHER): Payer: Medicare Other | Admitting: Internal Medicine

## 2013-08-06 VITALS — BP 152/90 | HR 85 | Temp 98.9°F | Ht 62.0 in | Wt 244.8 lb

## 2013-08-06 DIAGNOSIS — E119 Type 2 diabetes mellitus without complications: Secondary | ICD-10-CM | POA: Diagnosis not present

## 2013-08-06 DIAGNOSIS — J309 Allergic rhinitis, unspecified: Secondary | ICD-10-CM

## 2013-08-06 DIAGNOSIS — I10 Essential (primary) hypertension: Secondary | ICD-10-CM | POA: Diagnosis not present

## 2013-08-06 LAB — HEPATIC FUNCTION PANEL
ALT: 27 U/L (ref 0–35)
AST: 23 U/L (ref 0–37)
Albumin: 4.1 g/dL (ref 3.5–5.2)
Alkaline Phosphatase: 65 U/L (ref 39–117)
BILIRUBIN TOTAL: 0.9 mg/dL (ref 0.3–1.2)
Bilirubin, Direct: 0.1 mg/dL (ref 0.0–0.3)
Total Protein: 7.6 g/dL (ref 6.0–8.3)

## 2013-08-06 LAB — BASIC METABOLIC PANEL
BUN: 9 mg/dL (ref 6–23)
CHLORIDE: 103 meq/L (ref 96–112)
CO2: 31 mEq/L (ref 19–32)
Calcium: 9.6 mg/dL (ref 8.4–10.5)
Creatinine, Ser: 0.6 mg/dL (ref 0.4–1.2)
GFR: 125.41 mL/min (ref >60.00)
GLUCOSE: 81 mg/dL (ref 70–99)
POTASSIUM: 3.3 meq/L — AB (ref 3.5–5.1)
Sodium: 142 mEq/L (ref 135–145)

## 2013-08-06 LAB — LIPID PANEL
Cholesterol: 154 mg/dL (ref 0–200)
HDL: 62.8 mg/dL (ref >39.00)
LDL Cholesterol: 78 mg/dL (ref 0–99)
Total CHOL/HDL Ratio: 2
Triglycerides: 64 mg/dL (ref 0.0–149.0)
VLDL: 12.8 mg/dL (ref 0.0–40.0)

## 2013-08-06 LAB — HEMOGLOBIN A1C: HEMOGLOBIN A1C: 6.5 % (ref 4.6–6.5)

## 2013-08-06 MED ORDER — METHYLPREDNISOLONE ACETATE 80 MG/ML IJ SUSP
80.0000 mg | Freq: Once | INTRAMUSCULAR | Status: AC
  Administered 2013-08-06: 80 mg via INTRAMUSCULAR

## 2013-08-06 MED ORDER — FLUTICASONE PROPIONATE 50 MCG/ACT NA SUSP: 2.0000 | Freq: Every day | NASAL | Status: DC

## 2013-08-06 MED ORDER — ATORVASTATIN CALCIUM 40 MG PO TABS: 40.0000 mg | ORAL_TABLET | Freq: Every day | ORAL | Status: DC

## 2013-08-06 NOTE — Progress Notes (Signed)
Pre visit review using our clinic review tool, if applicable. No additional management support is needed unless otherwise documented below in the visit note. 

## 2013-08-06 NOTE — Assessment & Plan Note (Signed)
stable overall by history and exam, recent data reviewed with pt, and pt to continue medical treatment as before,  to f/u any worsening symptoms or concerns BP Readings from Last 3 Encounters:  08/06/13 152/90  03/12/13 140/90  02/17/13 148/92

## 2013-08-06 NOTE — Assessment & Plan Note (Signed)
stable overall by history and exam, recent data reviewed with pt, and pt to continue medical treatment as before,  to f/u any worsening symptoms or concerns Lab Results  Component Value Date   HGBA1C 7.5* 01/28/2013   For f/u labs
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
## 2013-08-06 NOTE — Patient Instructions (Addendum)
Please consider making a nurse visit appt for the new Prevnar pneumonia shot later  You had the steroid shot today  Please take all new medication as prescribed - the flonase  Please continue all other medications as before, and refills have been done if requested. Please have the pharmacy call with any other refills you may need.  Please continue your efforts at being more active, low cholesterol diet, and weight control. You are otherwise up to date with prevention measures today.  Please keep your appointments with your specialists as you may have planned  Please go to the LAB in the Basement (turn left off the elevator) for the tests to be done today You will be contacted by phone if any changes need to be made immediately.  Otherwise, you will receive a letter about your results with an explanation, but please check with MyChart first.  Please remember to sign up for MyChart if you have not done so, as this will be important to you in the future with finding out test results, communicating by private email, and scheduling acute appointments online when needed.  Please return in 6 months, or sooner if needed

## 2013-08-06 NOTE — Assessment & Plan Note (Signed)
Mild to mod, for medrol IM, add flonase asd,  to f/u any worsening symptoms or concerns

## 2013-08-20 ENCOUNTER — Other Ambulatory Visit: Payer: Self-pay | Admitting: Internal Medicine

## 2013-09-02 ENCOUNTER — Other Ambulatory Visit: Payer: Self-pay | Admitting: Internal Medicine

## 2013-09-20 ENCOUNTER — Other Ambulatory Visit: Payer: Self-pay | Admitting: Internal Medicine

## 2013-09-30 ENCOUNTER — Encounter: Payer: Self-pay | Admitting: Internal Medicine

## 2013-09-30 ENCOUNTER — Ambulatory Visit (INDEPENDENT_AMBULATORY_CARE_PROVIDER_SITE_OTHER): Payer: Medicare Other | Admitting: Internal Medicine

## 2013-09-30 ENCOUNTER — Other Ambulatory Visit: Payer: Self-pay | Admitting: Internal Medicine

## 2013-09-30 VITALS — BP 152/100 | HR 82 | Temp 98.6°F | Ht 62.0 in | Wt 247.0 lb

## 2013-09-30 DIAGNOSIS — J019 Acute sinusitis, unspecified: Secondary | ICD-10-CM | POA: Diagnosis not present

## 2013-09-30 DIAGNOSIS — I10 Essential (primary) hypertension: Secondary | ICD-10-CM

## 2013-09-30 DIAGNOSIS — R062 Wheezing: Secondary | ICD-10-CM | POA: Diagnosis not present

## 2013-09-30 DIAGNOSIS — E119 Type 2 diabetes mellitus without complications: Secondary | ICD-10-CM | POA: Diagnosis not present

## 2013-09-30 MED ORDER — HYDROCODONE-HOMATROPINE 5-1.5 MG/5ML PO SYRP: 5.0000 mL | ORAL_SOLUTION | Freq: Four times a day (QID) | ORAL | Status: DC | PRN

## 2013-09-30 MED ORDER — PREDNISONE 10 MG PO TABS: ORAL_TABLET | ORAL | Status: DC

## 2013-09-30 MED ORDER — METHYLPREDNISOLONE ACETATE 80 MG/ML IJ SUSP
80.0000 mg | Freq: Once | INTRAMUSCULAR | Status: AC
  Administered 2013-09-30: 80 mg via INTRAMUSCULAR

## 2013-09-30 MED ORDER — LEVOFLOXACIN 250 MG PO TABS: 250.0000 mg | ORAL_TABLET | Freq: Every day | ORAL | Status: DC

## 2013-09-30 NOTE — Assessment & Plan Note (Signed)
Mild to mod, for antibx course,  to f/u any worsening symptoms or concerns 

## 2013-09-30 NOTE — Assessment & Plan Note (Signed)
Mild to mod, c/w bronchospasm, for predpac asd,  to f/u any worsening symptoms or concerns 

## 2013-09-30 NOTE — Progress Notes (Signed)
Pre visit review using our clinic review tool, if applicable. No additional management support is needed unless otherwise documented below in the visit note. 
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
## 2013-09-30 NOTE — Patient Instructions (Signed)
You had the steroid shot today  Please take all new medication as prescribed  Please continue all other medications as before, and refills have been done if requested. Please have the pharmacy call with any other refills you may need.  Please keep your appointments with your specialists as you may have planned

## 2013-09-30 NOTE — Assessment & Plan Note (Signed)
stable overall by history and exam, recent data reviewed with pt, and pt to continue medical treatment as before,  to f/u any worsening symptoms or concerns, to continue to monitor BS on steroid tx, call for > 180-200

## 2013-09-30 NOTE — Assessment & Plan Note (Signed)
elev today, likely situational, but stable overall by history and exam, recent data reviewed with pt, and pt to continue medical treatment as before,  to f/u any worsening symptoms or concerns BP Readings from Last 3 Encounters:  09/30/13 152/100  08/06/13 152/90  03/12/13 140/90

## 2013-10-01 ENCOUNTER — Telehealth: Payer: Self-pay | Admitting: Internal Medicine

## 2013-10-01 NOTE — Telephone Encounter (Signed)
Relevant patient education assigned to patient using Emmi. ° °

## 2013-10-08 ENCOUNTER — Telehealth: Payer: Self-pay | Admitting: Internal Medicine

## 2013-10-08 NOTE — Telephone Encounter (Signed)
Patient states that she still has congestion, sore throat and draining eyes. Her cough is gone and she is no longer wheezing. Patient wants to know what she can do about her remaining symptoms. She has finished her levofloxacin and prednisone medications. Please advise.

## 2013-10-08 NOTE — Telephone Encounter (Signed)
Patient informed of MD instruction on medication.

## 2013-10-08 NOTE — Telephone Encounter (Signed)
At this point, if no fever, all I can offer is to cont the zyrtec and flonase, as there often is some symtpoms that persist after the initial tx  Pt could consider otc Zaditor for eye symtpoms, or delsym for cough

## 2013-10-12 DIAGNOSIS — M4120 Other idiopathic scoliosis, site unspecified: Secondary | ICD-10-CM | POA: Diagnosis not present

## 2013-10-12 DIAGNOSIS — M4800 Spinal stenosis, site unspecified: Secondary | ICD-10-CM | POA: Diagnosis not present

## 2013-11-11 ENCOUNTER — Other Ambulatory Visit: Payer: Self-pay | Admitting: Internal Medicine

## 2013-11-23 ENCOUNTER — Ambulatory Visit (INDEPENDENT_AMBULATORY_CARE_PROVIDER_SITE_OTHER): Payer: Medicare Other | Admitting: Physician Assistant

## 2013-11-23 ENCOUNTER — Encounter: Payer: Self-pay | Admitting: Physician Assistant

## 2013-11-23 VITALS — BP 122/88 | HR 70 | Temp 99.0°F | Resp 18 | Wt 250.0 lb

## 2013-11-23 DIAGNOSIS — J45901 Unspecified asthma with (acute) exacerbation: Secondary | ICD-10-CM

## 2013-11-23 DIAGNOSIS — H6120 Impacted cerumen, unspecified ear: Secondary | ICD-10-CM | POA: Diagnosis not present

## 2013-11-23 DIAGNOSIS — H6121 Impacted cerumen, right ear: Secondary | ICD-10-CM

## 2013-11-23 MED ORDER — PREDNISONE 20 MG PO TABS: 20.0000 mg | ORAL_TABLET | Freq: Two times a day (BID) | ORAL | Status: DC

## 2013-11-23 NOTE — Patient Instructions (Addendum)
Prednisone twice a day for 6 days for asthma exacerbation. Take with food to prevent nausea.  Continue taking Advair as directed.  We will have a followup in about 2 weeks with your PCP to reevaluate your asthma medication, and to see if additional medication is required to control your symptoms long-term.  If emergency symptoms discussed during visit developed, seek medical attention immediately.  Followup in about 2 week with PCP to reassess, or for worsening or persistent symptoms despite treatment. Asthma, Acute Bronchospasm Acute bronchospasm caused by asthma is also referred to as an asthma attack. Bronchospasm means your air passages become narrowed. The narrowing is caused by inflammation and tightening of the muscles in the air tubes (bronchi) in your lungs. This can make it hard to breathe or cause you to wheeze and cough. CAUSES Possible triggers are:  Animal dander from the skin, hair, or feathers of animals.  Dust mites contained in house dust.  Cockroaches.  Pollen from trees or grass.  Mold.  Cigarette or tobacco smoke.  Air pollutants such as dust, household cleaners, hair sprays, aerosol sprays, paint fumes, strong chemicals, or strong odors.  Cold air or weather changes. Cold air may trigger inflammation. Winds increase molds and pollens in the air.  Strong emotions such as crying or laughing hard.  Stress.  Certain medicines such as aspirin or beta-blockers.  Sulfites in foods and drinks, such as dried fruits and wine.  Infections or inflammatory conditions, such as a flu, cold, or inflammation of the nasal membranes (rhinitis).  Gastroesophageal reflux disease (GERD). GERD is a condition where stomach acid backs up into your esophagus.  Exercise or strenuous activity. SIGNS AND SYMPTOMS   Wheezing.  Excessive coughing, particularly at night.  Chest tightness.  Shortness of breath. DIAGNOSIS  Your health care provider will ask you about your  medical history and perform a physical exam. A chest X-ray or blood testing may be performed to look for other causes of your symptoms or other conditions that may have triggered your asthma attack. TREATMENT  Treatment is aimed at reducing inflammation and opening up the airways in your lungs. Most asthma attacks are treated with inhaled medicines. These include quick relief or rescue medicines (such as bronchodilators) and controller medicines (such as inhaled corticosteroids). These medicines are sometimes given through an inhaler or a nebulizer. Systemic steroid medicine taken by mouth or given through an IV tube also can be used to reduce the inflammation when an attack is moderate or severe. Antibiotic medicines are only used if a bacterial infection is present.  HOME CARE INSTRUCTIONS   Rest.  Drink plenty of liquids. This helps the mucus to remain thin and be easily coughed up. Only use caffeine in moderation and do not use alcohol until you have recovered from your illness.  Do not smoke. Avoid being exposed to secondhand smoke.  You play a critical role in keeping yourself in good health. Avoid exposure to things that cause you to wheeze or to have breathing problems.  Keep your medicines up-to-date and available. Carefully follow your health care provider's treatment plan.  Take your medicine exactly as prescribed.  When pollen or pollution is bad, keep windows closed and use an air conditioner or go to places with air conditioning.  Asthma requires careful medical care. See your health care provider for a follow-up as advised. If you are more than [redacted] weeks pregnant and you were prescribed any new medicines, let your obstetrician know about the visit and how  you are doing. Follow up with your health care provider as directed.  After you have recovered from your asthma attack, make an appointment with your outpatient doctor to talk about ways to reduce the likelihood of future  attacks. If you do not have a doctor who manages your asthma, make an appointment with a primary care doctor to discuss your asthma. SEEK IMMEDIATE MEDICAL CARE IF:   You are getting worse.  You have trouble breathing. If severe, call your local emergency services (911 in the [place]  You develop chest pain or discomfort.  You are vomiting.  You are not able to keep fluids down.  You are coughing up yellow, green, brown, or bloody sputum.  You have a fever and your symptoms suddenly get worse.  You have trouble swallowing. MAKE SURE YOU:   Understand these instructions.  Will watch your condition.  Will get help right away if you are not doing well or get worse. Document Released: 07/31/2006 Document Revised: 04/20/2013 Document Reviewed: 10/21/2012 [facility] Patient Information 2015 [place], [place]. This information is not intended to replace advice given to you by your health care provider. Make sure you discuss any questions you have with your health care provider. Cerumen Impaction A cerumen impaction is when the wax in your ear forms a plug. This plug usually causes reduced hearing. Sometimes it also causes an earache or dizziness. Removing a cerumen impaction can be difficult and painful. The wax sticks to the ear canal. The canal is sensitive and bleeds easily. If you try to remove a heavy wax buildup with a cotton tipped swab, you may push it in further. Irrigation with water, suction, and small ear curettes may be used to clear out the wax. If the impaction is fixed to the skin in the ear canal, ear drops may be needed for a few days to loosen the wax. People who build up a lot of wax frequently can use ear wax removal products available in your local drugstore. SEEK MEDICAL CARE IF:  You develop an earache, increased hearing loss, or marked dizziness. Document Released: 05/23/2004 Document Revised: 07/08/2011 Document Reviewed: 07/13/2009 [facility] Patient Information 2015  [place], [place]. This information is not intended to replace advice given to you by your health care provider. Make sure you discuss any questions you have with your health care provider.

## 2013-11-23 NOTE — Progress Notes (Signed)
Pre visit review using our clinic review tool, if applicable. No additional management support is needed unless otherwise documented below in the visit note. 
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
## 2013-11-25 MED ORDER — IPRATROPIUM-ALBUTEROL 0.5-2.5 (3) MG/3ML IN SOLN
3.0000 mL | Freq: Once | RESPIRATORY_TRACT | Status: AC
  Administered 2013-11-23: 3 mL via RESPIRATORY_TRACT

## 2013-11-25 NOTE — Addendum Note (Signed)
Addended by: Colleen Can on: 11/25/2013 09:04 AM   Modules accepted: Orders

## 2013-12-16 ENCOUNTER — Ambulatory Visit: Payer: Medicare Other | Admitting: Family Medicine

## 2013-12-16 ENCOUNTER — Encounter: Payer: Self-pay | Admitting: Physician Assistant

## 2013-12-16 ENCOUNTER — Ambulatory Visit (INDEPENDENT_AMBULATORY_CARE_PROVIDER_SITE_OTHER): Payer: Medicare Other | Admitting: Physician Assistant

## 2013-12-16 VITALS — BP 134/82 | HR 66 | Temp 98.2°F | Resp 18 | Wt 252.0 lb

## 2013-12-16 DIAGNOSIS — R300 Dysuria: Secondary | ICD-10-CM | POA: Diagnosis not present

## 2013-12-16 LAB — POCT URINALYSIS DIPSTICK
Bilirubin, UA: 1
Blood, UA: NEGATIVE
GLUCOSE UA: NEGATIVE
Leukocytes, UA: NEGATIVE
NITRITE UA: NEGATIVE
PH UA: 5.5
Protein, UA: 1
Spec Grav, UA: 1.02
UROBILINOGEN UA: 1
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
## 2013-12-16 NOTE — Patient Instructions (Addendum)
We will call you with the results of your urine when available.  Right now we should focus on forcing plenty of water to try and hydrate your urinalysis is very concentrated.  If emergency symptoms discussed during visit developed, seek medical attention immediately.  Followup as needed, or for worsening or persistent symptoms despite treatment.    Dysuria Dysuria is the medical term for pain with urination. There are many causes for dysuria, but urinary tract infection is the most common. If a urinalysis was performed it can show that there is a urinary tract infection. A urine culture confirms that you or your child is sick. You will need to follow up with a healthcare provider because:  If a urine culture was done you will need to know the culture results and treatment recommendations.  If the urine culture was positive, you or your child will need to be put on antibiotics or know if the antibiotics prescribed are the right antibiotics for your urinary tract infection.  If the urine culture is negative (no urinary tract infection), then other causes may need to be explored or antibiotics need to be stopped. Today laboratory work may have been done and there does not seem to be an infection. If cultures were done they will take at least 24 to 48 hours to be completed. Today x-rays may have been taken and they read as normal. No cause can be found for the problems. The x-rays may be re-read by a radiologist and you will be contacted if additional findings are made. You or your child may have been put on medications to help with this problem until you can see your primary caregiver. If the problems get better, see your primary caregiver if the problems return. If you were given antibiotics (medications which kill germs), take all of the mediations as directed for the full course of treatment.  If laboratory work was done, you need to find the results. Leave a telephone number where you can be  reached. If this is not possible, make sure you find out how you are to get test results. HOME CARE INSTRUCTIONS   Drink lots of fluids. For adults, drink eight, 8 ounce glasses of clear juice or water a day. For children, replace fluids as suggested by your caregiver.  Empty the bladder often. Avoid holding urine for long periods of time.  After a bowel movement, women should cleanse front to back, using each tissue only once.  Empty your bladder before and after sexual intercourse.  Take all the medicine given to you until it is gone. You may feel better in a few days, but TAKE ALL MEDICINE.  Avoid caffeine, tea, alcohol and carbonated beverages, because they tend to irritate the bladder.  In men, alcohol may irritate the prostate.  Only take over-the-counter or prescription medicines for pain, discomfort, or fever as directed by your caregiver.  If your caregiver has given you a follow-up appointment, it is very important to keep that appointment. Not keeping the appointment could result in a chronic or permanent injury, pain, and disability. If there is any problem keeping the appointment, you must call back to this facility for assistance. SEEK IMMEDIATE MEDICAL CARE IF:   Back pain develops.  A fever develops.  There is nausea (feeling sick to your stomach) or vomiting (throwing up).  Problems are no better with medications or are getting worse. MAKE SURE YOU:   Understand these instructions.  Will watch your condition.  Will get help  right away if you are not doing well or get worse. Document Released: 01/12/2004 Document Revised: 07/08/2011 Document Reviewed: 11/19/2007 [facility] Patient Information 2015 [place], [place]. This information is not intended to replace advice given to you by your health care provider. Make sure you discuss any questions you have with your health care provider.

## 2013-12-16 NOTE — Progress Notes (Signed)
Pre visit review using our clinic review tool, if applicable. No additional management support is needed unless otherwise documented below in the visit note. 

## 2013-12-18 LAB — URINE CULTURE

## 2013-12-30 DIAGNOSIS — Z6841 Body Mass Index (BMI) 40.0 and over, adult: Secondary | ICD-10-CM | POA: Diagnosis not present

## 2013-12-30 DIAGNOSIS — M5126 Other intervertebral disc displacement, lumbar region: Secondary | ICD-10-CM | POA: Diagnosis not present

## 2013-12-30 DIAGNOSIS — I10 Essential (primary) hypertension: Secondary | ICD-10-CM | POA: Diagnosis not present

## 2014-01-19 DIAGNOSIS — Z96659 Presence of unspecified artificial knee joint: Secondary | ICD-10-CM | POA: Diagnosis not present

## 2014-01-19 DIAGNOSIS — M25569 Pain in unspecified knee: Secondary | ICD-10-CM | POA: Diagnosis not present

## 2014-02-08 ENCOUNTER — Ambulatory Visit (INDEPENDENT_AMBULATORY_CARE_PROVIDER_SITE_OTHER): Payer: Medicare Other | Admitting: Internal Medicine

## 2014-02-08 ENCOUNTER — Encounter: Payer: Self-pay | Admitting: Internal Medicine

## 2014-02-08 ENCOUNTER — Other Ambulatory Visit (INDEPENDENT_AMBULATORY_CARE_PROVIDER_SITE_OTHER): Payer: Medicare Other

## 2014-02-08 VITALS — BP 124/72 | HR 78 | Temp 98.2°F | Ht 62.0 in | Wt 247.0 lb

## 2014-02-08 DIAGNOSIS — J4520 Mild intermittent asthma, uncomplicated: Secondary | ICD-10-CM | POA: Diagnosis not present

## 2014-02-08 DIAGNOSIS — Z23 Encounter for immunization: Secondary | ICD-10-CM | POA: Diagnosis not present

## 2014-02-08 DIAGNOSIS — I10 Essential (primary) hypertension: Secondary | ICD-10-CM | POA: Diagnosis not present

## 2014-02-08 DIAGNOSIS — E119 Type 2 diabetes mellitus without complications: Secondary | ICD-10-CM | POA: Diagnosis not present

## 2014-02-08 DIAGNOSIS — E785 Hyperlipidemia, unspecified: Secondary | ICD-10-CM

## 2014-02-08 LAB — LIPID PANEL
Cholesterol: 157 mg/dL (ref 0–200)
HDL: 56.3 mg/dL (ref >39.00)
LDL CALC: 81 mg/dL (ref 0–99)
NonHDL: 100.7
TRIGLYCERIDES: 98 mg/dL (ref 0.0–149.0)
Total CHOL/HDL Ratio: 3
VLDL: 19.6 mg/dL (ref 0.0–40.0)

## 2014-02-08 LAB — URINALYSIS, ROUTINE W REFLEX MICROSCOPIC
Bilirubin Urine: NEGATIVE
Hgb urine dipstick: NEGATIVE
Ketones, ur: NEGATIVE
Leukocytes, UA: NEGATIVE
NITRITE: NEGATIVE
RBC / HPF: NONE SEEN (ref 0–?)
SPECIFIC GRAVITY, URINE: 1.01 (ref 1.000–1.030)
TOTAL PROTEIN, URINE-UPE24: NEGATIVE
Urine Glucose: NEGATIVE
Urobilinogen, UA: 0.2 (ref 0.0–1.0)
WBC UA: NONE SEEN (ref 0–?)
pH: 7 (ref 5.0–8.0)

## 2014-02-08 LAB — BASIC METABOLIC PANEL
BUN: 8 mg/dL (ref 6–23)
CHLORIDE: 105 meq/L (ref 96–112)
CO2: 27 mEq/L (ref 19–32)
CREATININE: 0.8 mg/dL (ref 0.4–1.2)
Calcium: 9.5 mg/dL (ref 8.4–10.5)
GFR: 98.66 mL/min (ref >60.00)
Glucose, Bld: 93 mg/dL (ref 70–99)
Potassium: 3.8 mEq/L (ref 3.5–5.1)
Sodium: 143 mEq/L (ref 135–145)

## 2014-02-08 LAB — HEMOGLOBIN A1C: Hgb A1c MFr Bld: 6.6 % — ABNORMAL HIGH (ref 4.6–6.5)

## 2014-02-08 LAB — CBC WITH DIFFERENTIAL/PLATELET
Basophils Absolute: 0.1 10*3/uL (ref 0.0–0.1)
Basophils Relative: 0.7 % (ref 0.0–3.0)
EOS PCT: 3 % (ref 0.0–5.0)
Eosinophils Absolute: 0.3 10*3/uL (ref 0.0–0.7)
HCT: 43.8 % (ref 36.0–46.0)
Hemoglobin: 13.9 g/dL (ref 12.0–15.0)
LYMPHS PCT: 36.6 % (ref 12.0–46.0)
Lymphs Abs: 4 10*3/uL (ref 0.7–4.0)
MCHC: 31.6 g/dL (ref 30.0–36.0)
MCV: 82.4 fl (ref 78.0–100.0)
MONO ABS: 0.7 10*3/uL (ref 0.1–1.0)
Monocytes Relative: 6.5 % (ref 3.0–12.0)
Neutro Abs: 5.8 10*3/uL (ref 1.4–7.7)
Neutrophils Relative %: 53.2 % (ref 43.0–77.0)
Platelets: 269 10*3/uL (ref 150.0–400.0)
RBC: 5.32 Mil/uL — ABNORMAL HIGH (ref 3.87–5.11)
RDW: 14.9 % (ref 11.5–15.5)
WBC: 10.9 10*3/uL — ABNORMAL HIGH (ref 4.0–10.5)

## 2014-02-08 LAB — HEPATIC FUNCTION PANEL
ALT: 23 U/L (ref 0–35)
AST: 28 U/L (ref 0–37)
Albumin: 3.9 g/dL (ref 3.5–5.2)
Alkaline Phosphatase: 60 U/L (ref 39–117)
BILIRUBIN TOTAL: 0.8 mg/dL (ref 0.2–1.2)
Bilirubin, Direct: 0.1 mg/dL (ref 0.0–0.3)
Total Protein: 8.2 g/dL (ref 6.0–8.3)

## 2014-02-08 LAB — MICROALBUMIN / CREATININE URINE RATIO
Creatinine,U: 35.8 mg/dL
Microalb Creat Ratio: 1.1 mg/g (ref 0.0–30.0)
Microalb, Ur: 0.4 mg/dL (ref 0.0–1.9)

## 2014-02-08 LAB — TSH: TSH: 1.31 u[IU]/mL (ref 0.35–4.50)

## 2014-02-08 MED ORDER — FUROSEMIDE 40 MG PO TABS: 40.0000 mg | ORAL_TABLET | Freq: Two times a day (BID) | ORAL | Status: DC

## 2014-02-08 MED ORDER — POTASSIUM CHLORIDE ER 10 MEQ PO CPCR: ORAL_CAPSULE | ORAL | Status: DC

## 2014-02-08 MED ORDER — METFORMIN HCL ER 500 MG PO TB24: 500.0000 mg | ORAL_TABLET | Freq: Four times a day (QID) | ORAL | Status: DC

## 2014-02-08 MED ORDER — AMLODIPINE BESYLATE 5 MG PO TABS: 5.0000 mg | ORAL_TABLET | Freq: Every day | ORAL | Status: DC

## 2014-02-08 MED ORDER — LISINOPRIL 40 MG PO TABS: 40.0000 mg | ORAL_TABLET | Freq: Every day | ORAL | Status: DC

## 2014-02-08 MED ORDER — ATORVASTATIN CALCIUM 40 MG PO TABS: 40.0000 mg | ORAL_TABLET | Freq: Every day | ORAL | Status: DC

## 2014-02-08 MED ORDER — GLIMEPIRIDE 2 MG PO TABS: 2.0000 mg | ORAL_TABLET | Freq: Every day | ORAL | Status: DC

## 2014-02-08 NOTE — Progress Notes (Signed)
Pre visit review using our clinic review tool, if applicable. No additional management support is needed unless otherwise documented below in the visit note. 

## 2014-02-08 NOTE — Assessment & Plan Note (Signed)
stable overall by history and exam, recent data reviewed with pt, and pt to continue medical treatment as before,  to f/u any worsening symptoms or concerns / Lab Results  Component Value Date   HGBA1C 6.5 08/06/2013   For f/u labs

## 2014-02-08 NOTE — Addendum Note (Signed)
Addended by: Sharon Seller B on: 02/08/2014 11:45 AM   Modules accepted: Orders

## 2014-02-08 NOTE — Assessment & Plan Note (Signed)
With slight incr albut use recent but overall stable overall by history and exam, recent data reviewed with pt, and pt to continue medical treatment as before,  to f/u any worsening symptoms or concerns SpO2 Readings from Last 3 Encounters:  02/08/14 95%  11/23/13 96%  09/30/13 94%

## 2014-02-08 NOTE — Patient Instructions (Addendum)
You had the new Prevnar pneumonia shot today  Please continue all other medications as before, and refills have been done if requested.  Please have the pharmacy call with any other refills you may need.  Please continue your efforts at being more active, low cholesterol diet, and weight control.  You are otherwise up to date with prevention measures today.  Please keep your appointments with your specialists as you may have planned  Please go to the LAB in the Basement (turn left off the elevator) for the tests to be done today  You will be contacted by phone if any changes need to be made immediately.  Otherwise, you will receive a letter about your results with an explanation, but please check with MyChart first.  Please return in 6 months, or sooner if needed

## 2014-02-08 NOTE — Assessment & Plan Note (Signed)
stable overall by history and exam, recent data reviewed with pt, and pt to continue medical treatment as before,  to f/u any worsening symptoms or concerns Lab Results  Component Value Date   LDLCALC 78 08/06/2013
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
## 2014-02-08 NOTE — Assessment & Plan Note (Signed)
stable overall by history and exam, recent data reviewed with pt, and pt to continue medical treatment as before,  to f/u any worsening symptoms or concerns. \ BP Readings from Last 3 Encounters:  02/08/14 124/72  12/16/13 134/82  11/23/13 122/88   Has some ankle edema today likely related to venous insufficiency, for low salt, wt loss, exercise, consider compression stocknigs

## 2014-06-07 ENCOUNTER — Encounter: Payer: Self-pay | Admitting: Internal Medicine

## 2014-06-07 ENCOUNTER — Ambulatory Visit (INDEPENDENT_AMBULATORY_CARE_PROVIDER_SITE_OTHER): Payer: Medicare Other | Admitting: Internal Medicine

## 2014-06-07 VITALS — BP 162/100 | HR 89 | Temp 98.5°F | Ht 62.0 in | Wt 244.0 lb

## 2014-06-07 DIAGNOSIS — R062 Wheezing: Secondary | ICD-10-CM | POA: Diagnosis not present

## 2014-06-07 DIAGNOSIS — E119 Type 2 diabetes mellitus without complications: Secondary | ICD-10-CM | POA: Diagnosis not present

## 2014-06-07 DIAGNOSIS — J209 Acute bronchitis, unspecified: Secondary | ICD-10-CM | POA: Diagnosis not present

## 2014-06-07 MED ORDER — LEVALBUTEROL TARTRATE 45 MCG/ACT IN AERO: 2.0000 | INHALATION_SPRAY | Freq: Every day | RESPIRATORY_TRACT | Status: DC

## 2014-06-07 MED ORDER — HYDROCODONE-HOMATROPINE 5-1.5 MG/5ML PO SYRP: 5.0000 mL | ORAL_SOLUTION | Freq: Four times a day (QID) | ORAL | Status: DC | PRN

## 2014-06-07 MED ORDER — PREDNISONE 10 MG PO TABS: ORAL_TABLET | ORAL | Status: DC

## 2014-06-07 MED ORDER — LEVOFLOXACIN 250 MG PO TABS: 250.0000 mg | ORAL_TABLET | Freq: Every day | ORAL | Status: DC

## 2014-06-07 MED ORDER — METHYLPREDNISOLONE ACETATE 80 MG/ML IJ SUSP
80.0000 mg | Freq: Once | INTRAMUSCULAR | Status: AC
  Administered 2014-06-07: 80 mg via INTRAMUSCULAR
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
## 2014-06-07 NOTE — Assessment & Plan Note (Signed)
stable overall by history and exam, recent data reviewed with pt, and pt to continue medical treatment as before,  to f/u any worsening symptoms or concerns Lab Results  Component Value Date   HGBA1C 6.6* 02/08/2014   Pt to call for worsening cbg > 200 on steroid tx

## 2014-06-07 NOTE — Assessment & Plan Note (Addendum)
Mild to mod, for antibx course, cough med prn, to f/u any worsening symptoms or concerns 

## 2014-06-07 NOTE — Patient Instructions (Signed)
You had the steroid shot today  Please take all new medication as prescribed - the antibiotic, cough med, and prednisone as directed  Please continue all other medications as before, and refills have been done if requested - the xopenex inhaler  You can also take Mucinex (or it's generic off brand) for congestion, and tylenol as needed for pain.  Please have the pharmacy call with any other refills you may need.  Please keep your appointments with your specialists as you may have planned

## 2014-06-07 NOTE — Assessment & Plan Note (Signed)
.  Mild to mod, for depomedrol IM, predpac course,  to f/u any worsening symptoms or concerns

## 2014-06-07 NOTE — Progress Notes (Signed)
Pre visit review using our clinic review tool, if applicable. No additional management support is needed unless otherwise documented below in the visit note. 

## 2014-06-27 ENCOUNTER — Telehealth: Payer: Self-pay | Admitting: *Deleted

## 2014-06-27 NOTE — Telephone Encounter (Signed)
Left msg on triage stating started having some diarrhea & vomiting sxs yesterday. The vomiting has subsided but still having a little diarrhea. Wanting to know recommendations on what she can take. Called pt back inform her to follow a BRAT diet and make sure she is drinking fluid so she want get dehydrated. Pt states she had some jello which has stayed down. Inform pt to continue BRAT diet if she starts running fever will need to make appt...Denise [name]

## 2014-06-28 ENCOUNTER — Other Ambulatory Visit: Payer: Self-pay | Admitting: Neurosurgery

## 2014-06-28 DIAGNOSIS — M5136 Other intervertebral disc degeneration, lumbar region: Secondary | ICD-10-CM

## 2014-06-28 DIAGNOSIS — M5137 Other intervertebral disc degeneration, lumbosacral region: Secondary | ICD-10-CM | POA: Diagnosis not present

## 2014-06-28 DIAGNOSIS — Z6841 Body Mass Index (BMI) 40.0 and over, adult: Secondary | ICD-10-CM | POA: Diagnosis not present

## 2014-06-28 DIAGNOSIS — I10 Essential (primary) hypertension: Secondary | ICD-10-CM | POA: Diagnosis not present

## 2014-07-08 DIAGNOSIS — E119 Type 2 diabetes mellitus without complications: Secondary | ICD-10-CM | POA: Diagnosis not present

## 2014-07-08 DIAGNOSIS — Z961 Presence of intraocular lens: Secondary | ICD-10-CM | POA: Diagnosis not present

## 2014-07-08 LAB — HM DIABETES EYE EXAM

## 2014-07-16 ENCOUNTER — Ambulatory Visit
Admission: RE | Admit: 2014-07-16 | Discharge: 2014-07-16 | Disposition: A | Discharge status: Home / Self Care | Payer: Medicare Other | Source: Ambulatory Visit | Attending: Neurosurgery | Admitting: Neurosurgery

## 2014-07-16 DIAGNOSIS — Z981 Arthrodesis status: Secondary | ICD-10-CM | POA: Diagnosis not present

## 2014-07-16 DIAGNOSIS — M5136 Other intervertebral disc degeneration, lumbar region: Secondary | ICD-10-CM

## 2014-07-16 DIAGNOSIS — M4806 Spinal stenosis, lumbar region: Secondary | ICD-10-CM | POA: Diagnosis not present

## 2014-07-18 ENCOUNTER — Other Ambulatory Visit: Payer: Self-pay | Admitting: Internal Medicine

## 2014-07-19 DIAGNOSIS — M5137 Other intervertebral disc degeneration, lumbosacral region: Secondary | ICD-10-CM | POA: Diagnosis not present

## 2014-07-19 DIAGNOSIS — I10 Essential (primary) hypertension: Secondary | ICD-10-CM | POA: Diagnosis not present

## 2014-07-19 DIAGNOSIS — M5417 Radiculopathy, lumbosacral region: Secondary | ICD-10-CM | POA: Diagnosis not present

## 2014-07-19 DIAGNOSIS — Z6841 Body Mass Index (BMI) 40.0 and over, adult: Secondary | ICD-10-CM | POA: Diagnosis not present

## 2014-07-19 DIAGNOSIS — M5126 Other intervertebral disc displacement, lumbar region: Secondary | ICD-10-CM | POA: Diagnosis not present

## 2014-07-20 ENCOUNTER — Other Ambulatory Visit: Payer: Self-pay | Admitting: Neurosurgery

## 2014-07-28 ENCOUNTER — Encounter: Payer: Self-pay | Admitting: Internal Medicine

## 2014-07-29 HISTORY — PX: LUMBAR FUSION: SHX111

## 2014-08-09 ENCOUNTER — Encounter: Payer: Self-pay | Admitting: Internal Medicine

## 2014-08-09 ENCOUNTER — Ambulatory Visit (INDEPENDENT_AMBULATORY_CARE_PROVIDER_SITE_OTHER): Payer: Medicare Other | Admitting: Internal Medicine

## 2014-08-09 ENCOUNTER — Other Ambulatory Visit (INDEPENDENT_AMBULATORY_CARE_PROVIDER_SITE_OTHER): Payer: Medicare Other

## 2014-08-09 VITALS — BP 122/86 | HR 79 | Temp 99.3°F | Resp 18 | Ht 62.0 in | Wt 247.0 lb

## 2014-08-09 DIAGNOSIS — J4520 Mild intermittent asthma, uncomplicated: Secondary | ICD-10-CM

## 2014-08-09 DIAGNOSIS — E119 Type 2 diabetes mellitus without complications: Secondary | ICD-10-CM | POA: Diagnosis not present

## 2014-08-09 DIAGNOSIS — I10 Essential (primary) hypertension: Secondary | ICD-10-CM

## 2014-08-09 DIAGNOSIS — J309 Allergic rhinitis, unspecified: Secondary | ICD-10-CM

## 2014-08-09 LAB — BASIC METABOLIC PANEL
BUN: 8 mg/dL (ref 6–23)
CO2: 33 meq/L — AB (ref 19–32)
Calcium: 9.9 mg/dL (ref 8.4–10.5)
Chloride: 104 mEq/L (ref 96–112)
Creatinine, Ser: 0.72 mg/dL (ref 0.40–1.20)
GFR: 103.27 mL/min (ref >60.00)
Glucose, Bld: 102 mg/dL — ABNORMAL HIGH (ref 70–99)
Potassium: 3.7 mEq/L (ref 3.5–5.1)
SODIUM: 143 meq/L (ref 135–145)

## 2014-08-09 LAB — LIPID PANEL
CHOLESTEROL: 178 mg/dL (ref 0–200)
HDL: 70.2 mg/dL (ref >39.00)
LDL CALC: 93 mg/dL (ref 0–99)
NonHDL: 107.8
TRIGLYCERIDES: 74 mg/dL (ref 0.0–149.0)
Total CHOL/HDL Ratio: 3
VLDL: 14.8 mg/dL (ref 0.0–40.0)

## 2014-08-09 LAB — HEPATIC FUNCTION PANEL
ALT: 21 U/L (ref 0–35)
AST: 17 U/L (ref 0–37)
Albumin: 4.3 g/dL (ref 3.5–5.2)
Alkaline Phosphatase: 65 U/L (ref 39–117)
BILIRUBIN TOTAL: 0.7 mg/dL (ref 0.2–1.2)
Bilirubin, Direct: 0.1 mg/dL (ref 0.0–0.3)
Total Protein: 7.8 g/dL (ref 6.0–8.3)

## 2014-08-09 LAB — HEMOGLOBIN A1C: Hgb A1c MFr Bld: 6.6 % — ABNORMAL HIGH (ref 4.6–6.5)

## 2014-08-09 MED ORDER — METHYLPREDNISOLONE ACETATE 80 MG/ML IJ SUSP
80.0000 mg | Freq: Once | INTRAMUSCULAR | Status: AC
  Administered 2014-08-09: 80 mg via INTRAMUSCULAR
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
## 2014-08-09 NOTE — Assessment & Plan Note (Signed)
stable overall by history and exam, recent data reviewed with pt, and pt to continue medical treatment as before,  to f/u any worsening symptoms or concerns SpO2 Readings from Last 3 Encounters:  08/09/14 96%  06/07/14 97%  02/08/14 95%

## 2014-08-09 NOTE — Progress Notes (Signed)
Pre visit review using our clinic review tool, if applicable. No additional management support is needed unless otherwise documented below in the visit note. 

## 2014-08-09 NOTE — Assessment & Plan Note (Signed)
stable overall by history and exam, recent data reviewed with pt, and pt to continue medical treatment as before,  to f/u any worsening symptoms or concerns BP Readings from Last 3 Encounters:  08/09/14 122/86  06/07/14 162/100  02/08/14 124/72

## 2014-08-09 NOTE — Patient Instructions (Signed)
You had the steroid shot today  Please continue all other medications as before, and refills have been done if requested.  Please have the pharmacy call with any other refills you may need.  Please continue your efforts at being more active, low cholesterol diet, and weight control.  You are otherwise up to date with prevention measures today.  Please keep your appointments with your specialists as you may have planned  Please go to the LAB in the Basement (turn left off the elevator) for the tests to be done today  You will be contacted by phone if any changes need to be made immediately.  Otherwise, you will receive a letter about your results with an explanation, but please check with MyChart first.  Please remember to sign up for MyChart if you have not done so, as this will be important to you in the future with finding out test results, communicating by private email, and scheduling acute appointments online when needed.  Please return in 6 months, or sooner if needed 

## 2014-08-09 NOTE — Assessment & Plan Note (Signed)
Mild to mod seesoanl flare with right eustach valve dysfxn, for depomedrol,  Cont all other meds, ok for mucinex otc prn, to f/u any worsening symptoms or concerns

## 2014-08-09 NOTE — Assessment & Plan Note (Signed)
stable overall by history and exam, recent data reviewed with pt, and pt to continue medical treatment as before,  to f/u any worsening symptoms or concerns Lab Results  Component Value Date   HGBA1C 6.6* 02/08/2014   For f/u labs

## 2014-08-11 ENCOUNTER — Encounter (HOSPITAL_COMMUNITY): Payer: Self-pay

## 2014-08-11 ENCOUNTER — Encounter (HOSPITAL_COMMUNITY)
Admission: RE | Admit: 2014-08-11 | Discharge: 2014-08-11 | Disposition: A | Discharge status: Home / Self Care | Payer: Medicare Other | Source: Ambulatory Visit | Attending: Neurosurgery | Admitting: Neurosurgery

## 2014-08-11 DIAGNOSIS — Z01810 Encounter for preprocedural cardiovascular examination: Secondary | ICD-10-CM | POA: Diagnosis not present

## 2014-08-11 DIAGNOSIS — Z01812 Encounter for preprocedural laboratory examination: Secondary | ICD-10-CM | POA: Diagnosis not present

## 2014-08-11 HISTORY — DX: Cardiac murmur, unspecified: R01.1

## 2014-08-11 HISTORY — DX: Other intervertebral disc degeneration, lumbar region without mention of lumbar back pain or lower extremity pain: M51.369

## 2014-08-11 HISTORY — DX: Other intervertebral disc degeneration, lumbar region: M51.36

## 2014-08-11 LAB — TYPE AND SCREEN
ABO/RH(D): A POS
ANTIBODY SCREEN: NEGATIVE

## 2014-08-11 LAB — CBC
HCT: 41.3 % (ref 36.0–46.0)
Hemoglobin: 13.4 g/dL (ref 12.0–15.0)
MCH: 26.1 pg (ref 26.0–34.0)
MCHC: 32.4 g/dL (ref 30.0–36.0)
MCV: 80.5 fL (ref 78.0–100.0)
PLATELETS: 225 10*3/uL (ref 150–400)
RBC: 5.13 MIL/uL — AB (ref 3.87–5.11)
RDW: 16 % — ABNORMAL HIGH (ref 11.5–15.5)
WBC: 10.1 10*3/uL (ref 4.0–10.5)

## 2014-08-11 LAB — SURGICAL PCR SCREEN
MRSA, PCR: NEGATIVE
STAPHYLOCOCCUS AUREUS: NEGATIVE

## 2014-08-11 NOTE — Pre-Procedure Instructions (Signed)
Roseana Rhine  08/11/2014   Your procedure is scheduled on:  Monday, April 25.  Report to [facility] Admitting at 6:00 AM.   Call this number if you have problems the morning of surgery: [phone]                For any other questions, please call [phone], Monday - Friday 8 AM - 4 PM.   Remember:   Do not eat food or drink liquids after midnight Sunday, April 24.   Take these medicines the morning of surgery with A SIP OF WATER: amLODipine.  Use inhalers.                 On Monday, April 18 : Stop taking Aspirin, Coumadin, Plavix, Effient and Herbal medications.  Do not take any NSAIDs ie: Ibuprofen,  Advil,Naproxen or any medication containing Aspirin.  Do not wear jewelry, make-up or nail polish.  Do not wear lotions, powders, or perfume.  Do not shave 48 hours prior to surgery.   Do not bring valuables to the hospital.             [facility] [place] is not responsible for any belongings or valuables.               Contacts, dentures or bridgework may not be worn into surgery.  Leave suitcase in the car. After surgery it may be brought to your room.  For patients admitted to the hospital, discharge time is determined by your treatment team.               Patients discharged the day of surgery will not be allowed to drive home.  Name and phone number of your driver: -   Special Instructions: Review  [place] - Preparing For Surgery.   Please read over the following fact sheets that you were given: Pain Booklet, Coughing and Deep Breathing and Surgical Site Infection Prevention

## 2014-08-11 NOTE — Progress Notes (Signed)
Patient has a history of a Heart Murmer- followed by PCP, Dr Judi Cong. Last echo was 01/10/12.  Patient denies any chest pain, shortness of breath.

## 2014-08-11 NOTE — Progress Notes (Signed)
Anesthesia Chart Review:  Pt is 69 year old female scheduled for posterior lumbar fusion with hardware removal on 08/22/2014 with Dr. Saintclair Halsted.   PCP is Dr. Cathlean Cower.   PMH includes: HTN, DM, hyperlipidemia, asthma, heart murmur (unspecified- pt does have trivial MR and TR on 2013 echo), GERD. Former smoker. BMI 48.   Preoperative labs reviewed.    EKG: NSR. Anteroseptal infarct, age undetermined.   Echo 01/10/2012: - Left ventricle: The cavity size was normal. Wall thickness was increased in a pattern of mild LVH. Systolic function was normal. The estimated ejection fraction was in the range of 60% to 65%. Wall motion was normal; there were no regional wall motion abnormalities. Doppler parameters are consistent with abnormal left ventricular relaxation (grade 1 diastolic dysfunction). - Left atrium: The atrium was mildly dilated. - Pulmonary arteries: Systolic pressure was mildly increased.  Reviewed EKG with Dr. Lissa Hoard.   If no changes, I anticipate pt can proceed with surgery as scheduled.   Willeen Cass, FNP-BC [facility] Short Stay [facility]/Anesthesiology Phone: [phone] 08/11/2014 4:40 PM

## 2014-08-11 NOTE — Pre-Procedure Instructions (Addendum)
Elvis Boot  08/11/2014   Your procedure is scheduled on:  Monday, April 25.  Report to [facility] Admitting at 7:30AM.   Call this number if you have problems the morning of surgery: [phone]                For any other questions, please call [phone], Monday - Friday 8 AM - 4 PM.   Remember:   Do not eat food or drink liquids after midnight Sunday, April 24.   Take these medicines the morning of surgery with A SIP OF WATER: amLODipine.  Use inhalers.                 On Monday, April 18 : Stop taking Aspirin, Coumadin, Plavix, Effient and Herbal medications.  Do not take any NSAIDs ie: Ibuprofen,  Advil,Naproxen or any medication containing Aspirin.   Do not wear jewelry, make-up or nail polish.  Do not wear lotions, powders, or perfume.  Do not shave 48 hours prior to surgery.   Do not bring valuables to the hospital.             [facility] is not responsible for any belongings or valuables.               Contacts, dentures or bridgework may not be worn into surgery.  Leave suitcase in the car. After surgery it may be brought to your room.  For patients admitted to the hospital, discharge time is determined by your treatment team.               Patients discharged the day of surgery will not be allowed to drive home.  Name and phone number of your driver: -   Special Instructions: Review  [place] - Preparing For Surgery.   Please read over the following fact sheets that you were given: Pain Booklet, Coughing and Deep Breathing and Surgical Site Infection Prevention

## 2014-08-21 MED ORDER — CEFAZOLIN SODIUM-DEXTROSE 2-3 GM-% IV SOLR
2.0000 g | INTRAVENOUS | Status: AC
  Administered 2014-08-22: 2 g via INTRAVENOUS
  Filled 2014-08-21: qty 50

## 2014-08-21 MED ORDER — DEXAMETHASONE SODIUM PHOSPHATE 10 MG/ML IJ SOLN
10.0000 mg | INTRAMUSCULAR | Status: DC
  Filled 2014-08-21: qty 1

## 2014-08-22 ENCOUNTER — Inpatient Hospital Stay (HOSPITAL_COMMUNITY): Payer: Medicare Other

## 2014-08-22 ENCOUNTER — Inpatient Hospital Stay (HOSPITAL_COMMUNITY)
Admission: RE | Admit: 2014-08-22 | Discharge: 2014-08-26 | DRG: 460 | Disposition: A | Discharge status: Skilled Nursing Facility | Payer: Medicare Other | Source: Ambulatory Visit | Attending: Neurosurgery | Admitting: Neurosurgery

## 2014-08-22 ENCOUNTER — Encounter (HOSPITAL_COMMUNITY)
Admission: RE | Disposition: A | Discharge status: Skilled Nursing Facility | Payer: Medicare Other | Source: Ambulatory Visit | Attending: Neurosurgery

## 2014-08-22 ENCOUNTER — Inpatient Hospital Stay (HOSPITAL_COMMUNITY): Payer: Medicare Other | Admitting: Certified Registered Nurse Anesthetist

## 2014-08-22 ENCOUNTER — Encounter (HOSPITAL_COMMUNITY): Payer: Self-pay | Admitting: Certified Registered Nurse Anesthetist

## 2014-08-22 ENCOUNTER — Inpatient Hospital Stay (HOSPITAL_COMMUNITY): Payer: Medicare Other | Admitting: Emergency Medicine

## 2014-08-22 DIAGNOSIS — Z981 Arthrodesis status: Secondary | ICD-10-CM | POA: Diagnosis not present

## 2014-08-22 DIAGNOSIS — Z8249 Family history of ischemic heart disease and other diseases of the circulatory system: Secondary | ICD-10-CM | POA: Diagnosis not present

## 2014-08-22 DIAGNOSIS — Z6841 Body Mass Index (BMI) 40.0 and over, adult: Secondary | ICD-10-CM | POA: Diagnosis not present

## 2014-08-22 DIAGNOSIS — R2689 Other abnormalities of gait and mobility: Secondary | ICD-10-CM | POA: Diagnosis not present

## 2014-08-22 DIAGNOSIS — Z79899 Other long term (current) drug therapy: Secondary | ICD-10-CM | POA: Diagnosis not present

## 2014-08-22 DIAGNOSIS — M532X6 Spinal instabilities, lumbar region: Secondary | ICD-10-CM | POA: Diagnosis present

## 2014-08-22 DIAGNOSIS — Z7982 Long term (current) use of aspirin: Secondary | ICD-10-CM

## 2014-08-22 DIAGNOSIS — Z4789 Encounter for other orthopedic aftercare: Secondary | ICD-10-CM | POA: Diagnosis not present

## 2014-08-22 DIAGNOSIS — M419 Scoliosis, unspecified: Secondary | ICD-10-CM | POA: Diagnosis present

## 2014-08-22 DIAGNOSIS — Z833 Family history of diabetes mellitus: Secondary | ICD-10-CM | POA: Diagnosis not present

## 2014-08-22 DIAGNOSIS — E780 Pure hypercholesterolemia: Secondary | ICD-10-CM | POA: Diagnosis present

## 2014-08-22 DIAGNOSIS — M4800 Spinal stenosis, site unspecified: Secondary | ICD-10-CM | POA: Diagnosis not present

## 2014-08-22 DIAGNOSIS — Z7951 Long term (current) use of inhaled steroids: Secondary | ICD-10-CM

## 2014-08-22 DIAGNOSIS — I10 Essential (primary) hypertension: Secondary | ICD-10-CM | POA: Diagnosis present

## 2014-08-22 DIAGNOSIS — Z823 Family history of stroke: Secondary | ICD-10-CM | POA: Diagnosis not present

## 2014-08-22 DIAGNOSIS — Z96653 Presence of artificial knee joint, bilateral: Secondary | ICD-10-CM | POA: Diagnosis present

## 2014-08-22 DIAGNOSIS — Z87891 Personal history of nicotine dependence: Secondary | ICD-10-CM | POA: Diagnosis not present

## 2014-08-22 DIAGNOSIS — J45909 Unspecified asthma, uncomplicated: Secondary | ICD-10-CM | POA: Diagnosis present

## 2014-08-22 DIAGNOSIS — M159 Polyosteoarthritis, unspecified: Secondary | ICD-10-CM | POA: Diagnosis not present

## 2014-08-22 DIAGNOSIS — R278 Other lack of coordination: Secondary | ICD-10-CM | POA: Diagnosis not present

## 2014-08-22 DIAGNOSIS — M4806 Spinal stenosis, lumbar region: Secondary | ICD-10-CM | POA: Diagnosis not present

## 2014-08-22 DIAGNOSIS — E119 Type 2 diabetes mellitus without complications: Secondary | ICD-10-CM | POA: Diagnosis present

## 2014-08-22 DIAGNOSIS — M5489 Other dorsalgia: Secondary | ICD-10-CM | POA: Diagnosis not present

## 2014-08-22 DIAGNOSIS — M48061 Spinal stenosis, lumbar region without neurogenic claudication: Secondary | ICD-10-CM | POA: Diagnosis present

## 2014-08-22 DIAGNOSIS — K219 Gastro-esophageal reflux disease without esophagitis: Secondary | ICD-10-CM | POA: Diagnosis not present

## 2014-08-22 DIAGNOSIS — E785 Hyperlipidemia, unspecified: Secondary | ICD-10-CM | POA: Diagnosis present

## 2014-08-22 DIAGNOSIS — E669 Obesity, unspecified: Secondary | ICD-10-CM | POA: Diagnosis present

## 2014-08-22 DIAGNOSIS — Z419 Encounter for procedure for purposes other than remedying health state, unspecified: Secondary | ICD-10-CM

## 2014-08-22 DIAGNOSIS — M5126 Other intervertebral disc displacement, lumbar region: Secondary | ICD-10-CM | POA: Diagnosis present

## 2014-08-22 DIAGNOSIS — M6281 Muscle weakness (generalized): Secondary | ICD-10-CM | POA: Diagnosis not present

## 2014-08-22 DIAGNOSIS — M5136 Other intervertebral disc degeneration, lumbar region: Secondary | ICD-10-CM | POA: Diagnosis not present

## 2014-08-22 LAB — GLUCOSE, CAPILLARY
GLUCOSE-CAPILLARY: 101 mg/dL — AB (ref 70–99)
Glucose-Capillary: 117 mg/dL — ABNORMAL HIGH (ref 70–99)

## 2014-08-22 SURGERY — POSTERIOR LUMBAR FUSION 1 WITH HARDWARE REMOVAL
Anesthesia: General | Site: Back

## 2014-08-22 MED ORDER — SODIUM CHLORIDE 0.9 % IV SOLN: 250.0000 mL | INTRAVENOUS | Status: DC

## 2014-08-22 MED ORDER — MIDAZOLAM HCL 2 MG/2ML IJ SOLN
INTRAMUSCULAR | Status: AC
  Filled 2014-08-22: qty 2

## 2014-08-22 MED ORDER — LISINOPRIL 20 MG PO TABS
40.0000 mg | ORAL_TABLET | Freq: Every day | ORAL | Status: DC
  Administered 2014-08-23 – 2014-08-25 (×3): 40 mg via ORAL
  Filled 2014-08-22 (×3): qty 2

## 2014-08-22 MED ORDER — HYDROCODONE-ACETAMINOPHEN 5-325 MG PO TABS: 1.0000 | ORAL_TABLET | ORAL | Status: DC | PRN

## 2014-08-22 MED ORDER — PROPOFOL 10 MG/ML IV BOLUS
INTRAVENOUS | Status: AC
  Filled 2014-08-22: qty 20

## 2014-08-22 MED ORDER — FENTANYL CITRATE (PF) 250 MCG/5ML IJ SOLN
INTRAMUSCULAR | Status: AC
  Filled 2014-08-22: qty 5

## 2014-08-22 MED ORDER — 0.9 % SODIUM CHLORIDE (POUR BTL) OPTIME
TOPICAL | Status: DC | PRN
  Administered 2014-08-22: 1000 mL

## 2014-08-22 MED ORDER — ACETAMINOPHEN 650 MG RE SUPP: 650.0000 mg | RECTAL | Status: DC | PRN

## 2014-08-22 MED ORDER — VANCOMYCIN HCL 1000 MG IV SOLR
INTRAVENOUS | Status: AC
  Filled 2014-08-22: qty 1000

## 2014-08-22 MED ORDER — MIDAZOLAM HCL 5 MG/5ML IJ SOLN
INTRAMUSCULAR | Status: DC | PRN
  Administered 2014-08-22: 2 mg via INTRAVENOUS

## 2014-08-22 MED ORDER — HEMOSTATIC AGENTS (NO CHARGE) OPTIME
TOPICAL | Status: DC | PRN
  Administered 2014-08-22: 1 via TOPICAL

## 2014-08-22 MED ORDER — FENTANYL CITRATE (PF) 100 MCG/2ML IJ SOLN
INTRAMUSCULAR | Status: DC | PRN
  Administered 2014-08-22 (×5): 50 ug via INTRAVENOUS
  Administered 2014-08-22: 250 ug via INTRAVENOUS

## 2014-08-22 MED ORDER — BUPIVACAINE HCL (PF) 0.25 % IJ SOLN
INTRAMUSCULAR | Status: DC | PRN
  Administered 2014-08-22: 5 mL

## 2014-08-22 MED ORDER — EPHEDRINE SULFATE 50 MG/ML IJ SOLN
INTRAMUSCULAR | Status: DC | PRN
  Administered 2014-08-22: 5 mg via INTRAVENOUS

## 2014-08-22 MED ORDER — PROPOFOL 10 MG/ML IV BOLUS
INTRAVENOUS | Status: DC | PRN
  Administered 2014-08-22: 200 mg via INTRAVENOUS

## 2014-08-22 MED ORDER — ONDANSETRON HCL 4 MG/2ML IJ SOLN
INTRAMUSCULAR | Status: DC | PRN
  Administered 2014-08-22: 4 mg via INTRAVENOUS

## 2014-08-22 MED ORDER — MENTHOL 3 MG MT LOZG: 1.0000 | LOZENGE | OROMUCOSAL | Status: DC | PRN

## 2014-08-22 MED ORDER — ROCURONIUM BROMIDE 50 MG/5ML IV SOLN
INTRAVENOUS | Status: AC
  Filled 2014-08-22: qty 1

## 2014-08-22 MED ORDER — HYDROMORPHONE HCL 1 MG/ML IJ SOLN
0.2500 mg | INTRAMUSCULAR | Status: DC | PRN
  Administered 2014-08-22: 0.25 mg via INTRAVENOUS
  Administered 2014-08-22: 0.5 mg via INTRAVENOUS
  Administered 2014-08-22: 0.25 mg via INTRAVENOUS

## 2014-08-22 MED ORDER — SODIUM CHLORIDE 0.9 % IJ SOLN
3.0000 mL | Freq: Two times a day (BID) | INTRAMUSCULAR | Status: DC
  Administered 2014-08-24 – 2014-08-25 (×3): 3 mL via INTRAVENOUS

## 2014-08-22 MED ORDER — METFORMIN HCL ER 500 MG PO TB24
2000.0000 mg | ORAL_TABLET | Freq: Every day | ORAL | Status: DC
  Administered 2014-08-23 – 2014-08-26 (×4): 2000 mg via ORAL
  Filled 2014-08-22 (×4): qty 4

## 2014-08-22 MED ORDER — ATORVASTATIN CALCIUM 40 MG PO TABS
40.0000 mg | ORAL_TABLET | Freq: Every day | ORAL | Status: DC
  Administered 2014-08-22 – 2014-08-25 (×4): 40 mg via ORAL
  Filled 2014-08-22 (×4): qty 1

## 2014-08-22 MED ORDER — LIDOCAINE HCL (CARDIAC) 20 MG/ML IV SOLN
INTRAVENOUS | Status: AC
  Filled 2014-08-22: qty 5

## 2014-08-22 MED ORDER — LEVALBUTEROL TARTRATE 45 MCG/ACT IN AERO: 2.0000 | INHALATION_SPRAY | Freq: Every day | RESPIRATORY_TRACT | Status: DC

## 2014-08-22 MED ORDER — ALUM & MAG HYDROXIDE-SIMETH 200-200-20 MG/5ML PO SUSP: 30.0000 mL | Freq: Four times a day (QID) | ORAL | Status: DC | PRN

## 2014-08-22 MED ORDER — OXYCODONE-ACETAMINOPHEN 5-325 MG PO TABS
1.0000 | ORAL_TABLET | ORAL | Status: DC | PRN
  Administered 2014-08-23 – 2014-08-26 (×14): 2 via ORAL
  Filled 2014-08-22 (×15): qty 2

## 2014-08-22 MED ORDER — HYDROMORPHONE HCL 1 MG/ML IJ SOLN
0.5000 mg | INTRAMUSCULAR | Status: DC | PRN
  Administered 2014-08-22 (×2): 1 mg via INTRAVENOUS
  Administered 2014-08-23: 0.5 mg via INTRAVENOUS
  Filled 2014-08-22 (×3): qty 1

## 2014-08-22 MED ORDER — ASPIRIN EC 81 MG PO TBEC
81.0000 mg | DELAYED_RELEASE_TABLET | Freq: Every day | ORAL | Status: DC
  Administered 2014-08-23 – 2014-08-26 (×4): 81 mg via ORAL
  Filled 2014-08-22 (×4): qty 1

## 2014-08-22 MED ORDER — MEPERIDINE HCL 25 MG/ML IJ SOLN: 6.2500 mg | INTRAMUSCULAR | Status: DC | PRN

## 2014-08-22 MED ORDER — SODIUM CHLORIDE 0.9 % IR SOLN
Status: DC | PRN
  Administered 2014-08-22: 11:00:00

## 2014-08-22 MED ORDER — FUROSEMIDE 40 MG PO TABS
40.0000 mg | ORAL_TABLET | Freq: Two times a day (BID) | ORAL | Status: DC
  Administered 2014-08-22 – 2014-08-26 (×8): 40 mg via ORAL
  Filled 2014-08-22 (×8): qty 1

## 2014-08-22 MED ORDER — PROMETHAZINE HCL 25 MG/ML IJ SOLN
INTRAMUSCULAR | Status: AC
  Filled 2014-08-22: qty 1

## 2014-08-22 MED ORDER — SCOPOLAMINE 1 MG/3DAYS TD PT72
MEDICATED_PATCH | TRANSDERMAL | Status: AC
  Filled 2014-08-22: qty 1

## 2014-08-22 MED ORDER — MULTIVITAMINS PO CAPS: 1.0000 | ORAL_CAPSULE | Freq: Every day | ORAL | Status: DC

## 2014-08-22 MED ORDER — VITAMIN D 1000 UNITS PO TABS
1000.0000 [IU] | ORAL_TABLET | Freq: Every day | ORAL | Status: DC
  Administered 2014-08-23 – 2014-08-26 (×4): 1000 [IU] via ORAL
  Filled 2014-08-22 (×4): qty 1

## 2014-08-22 MED ORDER — LACTATED RINGERS IV SOLN
INTRAVENOUS | Status: DC
  Administered 2014-08-22 (×3): via INTRAVENOUS

## 2014-08-22 MED ORDER — INSULIN ASPART 100 UNIT/ML ~~LOC~~ SOLN: 0.0000 [IU] | Freq: Three times a day (TID) | SUBCUTANEOUS | Status: DC

## 2014-08-22 MED ORDER — FLUTICASONE PROPIONATE 50 MCG/ACT NA SUSP
2.0000 | Freq: Every day | NASAL | Status: DC
  Administered 2014-08-23 – 2014-08-26 (×4): 2 via NASAL
  Filled 2014-08-22: qty 16

## 2014-08-22 MED ORDER — ROCURONIUM BROMIDE 100 MG/10ML IV SOLN
INTRAVENOUS | Status: DC | PRN
  Administered 2014-08-22: 50 mg via INTRAVENOUS

## 2014-08-22 MED ORDER — AMLODIPINE BESYLATE 5 MG PO TABS
5.0000 mg | ORAL_TABLET | Freq: Every day | ORAL | Status: DC
  Administered 2014-08-23 – 2014-08-25 (×3): 5 mg via ORAL
  Filled 2014-08-22 (×3): qty 1

## 2014-08-22 MED ORDER — ARTIFICIAL TEARS OP OINT
TOPICAL_OINTMENT | OPHTHALMIC | Status: DC | PRN
  Administered 2014-08-22: 1 via OPHTHALMIC

## 2014-08-22 MED ORDER — ONDANSETRON HCL 4 MG/2ML IJ SOLN
4.0000 mg | INTRAMUSCULAR | Status: DC | PRN
  Administered 2014-08-24: 4 mg via INTRAVENOUS
  Filled 2014-08-22: qty 2

## 2014-08-22 MED ORDER — HYDROMORPHONE HCL 1 MG/ML IJ SOLN
INTRAMUSCULAR | Status: AC
  Administered 2014-08-22: 0.25 mg via INTRAVENOUS
  Filled 2014-08-22: qty 1

## 2014-08-22 MED ORDER — INSULIN ASPART 100 UNIT/ML ~~LOC~~ SOLN: 0.0000 [IU] | Freq: Every day | SUBCUTANEOUS | Status: DC

## 2014-08-22 MED ORDER — MIDAZOLAM HCL 2 MG/2ML IJ SOLN: 0.5000 mg | Freq: Once | INTRAMUSCULAR | Status: DC | PRN

## 2014-08-22 MED ORDER — BUPIVACAINE LIPOSOME 1.3 % IJ SUSP
20.0000 mL | INTRAMUSCULAR | Status: AC
  Filled 2014-08-22: qty 20

## 2014-08-22 MED ORDER — NEOSTIGMINE METHYLSULFATE 10 MG/10ML IV SOLN
INTRAVENOUS | Status: AC
Start: 2014-08-22 — End: 2014-08-22
  Filled 2014-08-22: qty 1

## 2014-08-22 MED ORDER — CYCLOBENZAPRINE HCL 10 MG PO TABS
10.0000 mg | ORAL_TABLET | Freq: Three times a day (TID) | ORAL | Status: DC | PRN
  Administered 2014-08-24: 10 mg via ORAL
  Filled 2014-08-22: qty 1

## 2014-08-22 MED ORDER — BUPIVACAINE LIPOSOME 1.3 % IJ SUSP
INTRAMUSCULAR | Status: DC | PRN
  Administered 2014-08-22: 20 mL

## 2014-08-22 MED ORDER — EPHEDRINE SULFATE 50 MG/ML IJ SOLN
INTRAMUSCULAR | Status: AC
  Filled 2014-08-22: qty 1

## 2014-08-22 MED ORDER — MOMETASONE FURO-FORMOTEROL FUM 100-5 MCG/ACT IN AERO
2.0000 | INHALATION_SPRAY | Freq: Two times a day (BID) | RESPIRATORY_TRACT | Status: DC
  Administered 2014-08-22 – 2014-08-26 (×8): 2 via RESPIRATORY_TRACT
  Filled 2014-08-22: qty 8.8

## 2014-08-22 MED ORDER — CEFAZOLIN SODIUM 1-5 GM-% IV SOLN
1.0000 g | Freq: Three times a day (TID) | INTRAVENOUS | Status: AC
  Administered 2014-08-22 – 2014-08-23 (×2): 1 g via INTRAVENOUS
  Filled 2014-08-22 (×2): qty 50

## 2014-08-22 MED ORDER — ARTIFICIAL TEARS OP OINT
TOPICAL_OINTMENT | OPHTHALMIC | Status: AC
  Filled 2014-08-22: qty 3.5

## 2014-08-22 MED ORDER — ASPIRIN 81 MG PO TBEC: 81.0000 mg | DELAYED_RELEASE_TABLET | Freq: Every day | ORAL | Status: DC

## 2014-08-22 MED ORDER — VANCOMYCIN HCL 1000 MG IV SOLR
INTRAVENOUS | Status: DC | PRN
  Administered 2014-08-22: 1000 mg via TOPICAL

## 2014-08-22 MED ORDER — SODIUM CHLORIDE 0.9 % IJ SOLN: 3.0000 mL | INTRAMUSCULAR | Status: DC | PRN

## 2014-08-22 MED ORDER — ACETAMINOPHEN 325 MG PO TABS: 650.0000 mg | ORAL_TABLET | ORAL | Status: DC | PRN

## 2014-08-22 MED ORDER — ONDANSETRON HCL 4 MG/2ML IJ SOLN
INTRAMUSCULAR | Status: AC
  Filled 2014-08-22: qty 2

## 2014-08-22 MED ORDER — LIDOCAINE-EPINEPHRINE 1 %-1:100000 IJ SOLN
INTRAMUSCULAR | Status: DC | PRN
  Administered 2014-08-22: 5 mL

## 2014-08-22 MED ORDER — GLIMEPIRIDE 4 MG PO TABS
2.0000 mg | ORAL_TABLET | Freq: Every day | ORAL | Status: DC
  Administered 2014-08-23 – 2014-08-26 (×4): 2 mg via ORAL
  Filled 2014-08-22 (×4): qty 1

## 2014-08-22 MED ORDER — LORATADINE 10 MG PO TABS
10.0000 mg | ORAL_TABLET | Freq: Every day | ORAL | Status: DC
  Administered 2014-08-23 – 2014-08-26 (×4): 10 mg via ORAL
  Filled 2014-08-22 (×4): qty 1

## 2014-08-22 MED ORDER — GLYCOPYRROLATE 0.2 MG/ML IJ SOLN
INTRAMUSCULAR | Status: DC | PRN
  Administered 2014-08-22: .8 mg via INTRAVENOUS

## 2014-08-22 MED ORDER — SUCCINYLCHOLINE CHLORIDE 20 MG/ML IJ SOLN
INTRAMUSCULAR | Status: AC
  Filled 2014-08-22: qty 1

## 2014-08-22 MED ORDER — LIDOCAINE HCL (CARDIAC) 20 MG/ML IV SOLN
INTRAVENOUS | Status: DC | PRN
  Administered 2014-08-22: 30 mg via INTRAVENOUS

## 2014-08-22 MED ORDER — GLYCOPYRROLATE 0.2 MG/ML IJ SOLN
INTRAMUSCULAR | Status: AC
  Filled 2014-08-22: qty 4

## 2014-08-22 MED ORDER — LEVALBUTEROL HCL 0.63 MG/3ML IN NEBU
0.6300 mg | INHALATION_SOLUTION | Freq: Every day | RESPIRATORY_TRACT | Status: DC
  Administered 2014-08-23 – 2014-08-26 (×4): 0.63 mg via RESPIRATORY_TRACT
  Filled 2014-08-22 (×4): qty 3

## 2014-08-22 MED ORDER — PHENOL 1.4 % MT LIQD
1.0000 | OROMUCOSAL | Status: DC | PRN
Start: 2014-08-22 — End: 2014-08-26

## 2014-08-22 MED ORDER — PHENYLEPHRINE HCL 10 MG/ML IJ SOLN
10.0000 mg | INTRAVENOUS | Status: DC | PRN
  Administered 2014-08-22: 15 ug/min via INTRAVENOUS

## 2014-08-22 MED ORDER — NEOSTIGMINE METHYLSULFATE 10 MG/10ML IV SOLN
INTRAVENOUS | Status: DC | PRN
  Administered 2014-08-22: 5 mg via INTRAVENOUS

## 2014-08-22 MED ORDER — FLEET ENEMA 7-19 GM/118ML RE ENEM: 1.0000 | ENEMA | Freq: Once | RECTAL | Status: AC | PRN

## 2014-08-22 MED ORDER — POTASSIUM CHLORIDE CRYS ER 10 MEQ PO TBCR
10.0000 meq | EXTENDED_RELEASE_TABLET | Freq: Once | ORAL | Status: AC
  Administered 2014-08-22: 10 meq via ORAL
  Filled 2014-08-22: qty 1

## 2014-08-22 MED ORDER — VECURONIUM BROMIDE 10 MG IV SOLR
INTRAVENOUS | Status: DC | PRN
  Administered 2014-08-22 (×4): 1 mg via INTRAVENOUS

## 2014-08-22 MED ORDER — PHENYLEPHRINE 40 MCG/ML (10ML) SYRINGE FOR IV PUSH (FOR BLOOD PRESSURE SUPPORT)
PREFILLED_SYRINGE | INTRAVENOUS | Status: AC
  Filled 2014-08-22: qty 10

## 2014-08-22 MED ORDER — SCOPOLAMINE 1 MG/3DAYS TD PT72
MEDICATED_PATCH | TRANSDERMAL | Status: AC
  Administered 2014-08-22: 1 via TRANSDERMAL
  Filled 2014-08-22: qty 1

## 2014-08-22 MED ORDER — VITAMIN D3 25 MCG (1000 UT) PO CAPS: 1000.0000 [IU] | ORAL_CAPSULE | Freq: Every day | ORAL | Status: DC

## 2014-08-22 MED ORDER — SODIUM CHLORIDE 0.9 % IJ SOLN
INTRAMUSCULAR | Status: AC
  Filled 2014-08-22: qty 10

## 2014-08-22 MED ORDER — THROMBIN 5000 UNITS EX SOLR
CUTANEOUS | Status: DC | PRN
  Administered 2014-08-22 (×4): 5000 [IU] via TOPICAL

## 2014-08-22 MED ORDER — ADULT MULTIVITAMIN W/MINERALS CH
1.0000 | ORAL_TABLET | Freq: Every day | ORAL | Status: DC
  Administered 2014-08-23 – 2014-08-26 (×4): 1 via ORAL
  Filled 2014-08-22 (×4): qty 1

## 2014-08-22 MED ORDER — PHENYLEPHRINE HCL 10 MG/ML IJ SOLN
INTRAMUSCULAR | Status: DC | PRN
  Administered 2014-08-22: 40 ug via INTRAVENOUS
  Administered 2014-08-22: 80 ug via INTRAVENOUS
  Administered 2014-08-22 (×4): 40 ug via INTRAVENOUS

## 2014-08-22 MED ORDER — PROMETHAZINE HCL 25 MG/ML IJ SOLN
6.2500 mg | INTRAMUSCULAR | Status: DC | PRN
Start: 2014-08-22 — End: 2014-08-22
  Administered 2014-08-22: 6.25 mg via INTRAVENOUS

## 2014-08-22 MED FILL — Sodium Chloride IV Soln 0.9%: INTRAVENOUS | Qty: 1000 | Status: AC

## 2014-08-22 MED FILL — Heparin Sodium (Porcine) Inj 1000 Unit/ML: INTRAMUSCULAR | Qty: 30 | Status: AC

## 2014-08-22 SURGICAL SUPPLY — 72 items
ALLOSTEM STRIP 20MMX50MM (Tissue) ×2 IMPLANT
BAG DECANTER FOR FLEXI CONT (MISCELLANEOUS) ×2 IMPLANT
BENZOIN TINCTURE PRP APPL 2/3 (GAUZE/BANDAGES/DRESSINGS) ×2 IMPLANT
BLADE CLIPPER SURG (BLADE) IMPLANT
BLADE SURG 11 STRL SS (BLADE) ×2 IMPLANT
BONE ALLOSTEM MORSELIZED 1CC (Bone Implant) ×2 IMPLANT
BRUSH SCRUB EZ PLAIN DRY (MISCELLANEOUS) ×2 IMPLANT
BUR MATCHSTICK NEURO 3.0 LAGG (BURR) ×2 IMPLANT
BUR PRECISION FLUTE 6.0 (BURR) ×2 IMPLANT
CAGE RISE 10X22 11-17 (Cage) ×4 IMPLANT
CANISTER SUCT 3000ML PPV (MISCELLANEOUS) ×2 IMPLANT
CONT SPEC 4OZ CLIKSEAL STRL BL (MISCELLANEOUS) ×4 IMPLANT
COVER BACK TABLE 24X17X13 BIG (DRAPES) IMPLANT
COVER BACK TABLE 60X90IN (DRAPES) ×2 IMPLANT
DECANTER SPIKE VIAL GLASS SM (MISCELLANEOUS) ×2 IMPLANT
DERMABOND ADHESIVE PROPEN (GAUZE/BANDAGES/DRESSINGS) ×1
DERMABOND ADVANCED .7 DNX6 (GAUZE/BANDAGES/DRESSINGS) ×1 IMPLANT
DRAPE C-ARM 42X72 X-RAY (DRAPES) ×2 IMPLANT
DRAPE C-ARMOR (DRAPES) ×2 IMPLANT
DRAPE LAPAROTOMY 100X72X124 (DRAPES) ×2 IMPLANT
DRAPE POUCH INSTRU U-SHP 10X18 (DRAPES) ×2 IMPLANT
DRAPE PROXIMA HALF (DRAPES) ×2 IMPLANT
DRAPE SURG 17X23 STRL (DRAPES) ×2 IMPLANT
DRSG OPSITE 4X5.5 SM (GAUZE/BANDAGES/DRESSINGS) ×2 IMPLANT
DRSG OPSITE POSTOP 4X6 (GAUZE/BANDAGES/DRESSINGS) ×2 IMPLANT
DURAPREP 26ML APPLICATOR (WOUND CARE) ×2 IMPLANT
ELECT REM PT RETURN 9FT ADLT (ELECTROSURGICAL) ×2
ELECTRODE REM PT RTRN 9FT ADLT (ELECTROSURGICAL) ×1 IMPLANT
EVACUATOR 3/16  PVC DRAIN (DRAIN) ×1
EVACUATOR 3/16 PVC DRAIN (DRAIN) ×1 IMPLANT
GAUZE SPONGE 4X4 12PLY STRL (GAUZE/BANDAGES/DRESSINGS) ×2 IMPLANT
GAUZE SPONGE 4X4 16PLY XRAY LF (GAUZE/BANDAGES/DRESSINGS) IMPLANT
GLOVE BIO SURGEON STRL SZ8 (GLOVE) ×6 IMPLANT
GLOVE BIO SURGEON STRL SZ8.5 (GLOVE) ×2 IMPLANT
GLOVE BIOGEL PI IND STRL 7.5 (GLOVE) ×3 IMPLANT
GLOVE BIOGEL PI INDICATOR 7.5 (GLOVE) ×3
GLOVE ECLIPSE 7.5 STRL STRAW (GLOVE) IMPLANT
GLOVE EXAM NITRILE LRG STRL (GLOVE) IMPLANT
GLOVE EXAM NITRILE MD LF STRL (GLOVE) IMPLANT
GLOVE EXAM NITRILE XL STR (GLOVE) IMPLANT
GLOVE EXAM NITRILE XS STR PU (GLOVE) IMPLANT
GLOVE INDICATOR 8.5 STRL (GLOVE) ×4 IMPLANT
GLOVE SURG SS PI 7.0 STRL IVOR (GLOVE) ×6 IMPLANT
GOWN STRL REUS W/ TWL LRG LVL3 (GOWN DISPOSABLE) IMPLANT
GOWN STRL REUS W/ TWL XL LVL3 (GOWN DISPOSABLE) ×5 IMPLANT
GOWN STRL REUS W/TWL 2XL LVL3 (GOWN DISPOSABLE) IMPLANT
GOWN STRL REUS W/TWL LRG LVL3 (GOWN DISPOSABLE)
GOWN STRL REUS W/TWL XL LVL3 (GOWN DISPOSABLE) ×5
KIT BASIN OR (CUSTOM PROCEDURE TRAY) ×2 IMPLANT
KIT ROOM TURNOVER OR (KITS) ×2 IMPLANT
LIQUID BAND (GAUZE/BANDAGES/DRESSINGS) IMPLANT
NEEDLE HYPO 21X1.5 SAFETY (NEEDLE) ×2 IMPLANT
NEEDLE HYPO 25X1 1.5 SAFETY (NEEDLE) ×2 IMPLANT
NS IRRIG 1000ML POUR BTL (IV SOLUTION) ×2 IMPLANT
PACK LAMINECTOMY NEURO (CUSTOM PROCEDURE TRAY) ×2 IMPLANT
PAD ARMBOARD 7.5X6 YLW CONV (MISCELLANEOUS) ×8 IMPLANT
ROD PREBENT 6.35X50 (Rod) ×4 IMPLANT
SCREW PEDICLE VA L635 6.5X40M (Screw) ×4 IMPLANT
SCREW SET NON BREAK OFF (Screw) ×8 IMPLANT
SPONGE LAP 4X18 X RAY DECT (DISPOSABLE) IMPLANT
SPONGE SURGIFOAM ABS GEL 100 (HEMOSTASIS) ×2 IMPLANT
STRIP CLOSURE SKIN 1/2X4 (GAUZE/BANDAGES/DRESSINGS) ×2 IMPLANT
SUT VIC AB 0 CT1 18XCR BRD8 (SUTURE) ×1 IMPLANT
SUT VIC AB 0 CT1 8-18 (SUTURE) ×1
SUT VIC AB 2-0 CT1 18 (SUTURE) ×2 IMPLANT
SUT VICRYL 4-0 PS2 18IN ABS (SUTURE) ×2 IMPLANT
SYR 20CC LL (SYRINGE) ×2 IMPLANT
SYR 20ML ECCENTRIC (SYRINGE) ×2 IMPLANT
TOWEL OR 17X24 6PK STRL BLUE (TOWEL DISPOSABLE) ×2 IMPLANT
TOWEL OR 17X26 10 PK STRL BLUE (TOWEL DISPOSABLE) ×2 IMPLANT
TRAY FOLEY CATH 14FRSI W/METER (CATHETERS) ×2 IMPLANT
WATER STERILE IRR 1000ML POUR (IV SOLUTION) ×2 IMPLANT

## 2014-08-22 NOTE — Anesthesia Preprocedure Evaluation (Addendum)
Anesthesia Evaluation  Patient identified by MRN, date of birth, ID band Patient awake    Reviewed: Allergy & Precautions, NPO status , Patient's Chart, lab work & pertinent test results  History of Anesthesia Complications (+) PONV and history of anesthetic complications  Airway Mallampati: II  TM Distance: >3 FB Neck ROM: Full    Dental  (+) Poor Dentition, Dental Advisory Given, Missing   Pulmonary asthma , COPD (last inhaler use 10d ago) COPD inhaler, former smoker,  breath sounds clear to auscultation        Cardiovascular hypertension, Pt. on medications - angina- Valvular Problems/MurmursRhythm:Regular Rate:Normal  '13 ECHO: EF 60-65%, mild LVH, valves OK   Neuro/Psych Anxiety Depression Chronic back pain    GI/Hepatic Neg liver ROS, GERD-  Controlled,  Endo/Other  diabetes (glu 101), Oral Hypoglycemic AgentsMorbid obesity  Renal/GU negative Renal ROS     Musculoskeletal   Abdominal (+) + obese,   Peds  Hematology   Anesthesia Other Findings   Reproductive/Obstetrics                          Anesthesia Physical Anesthesia Plan  ASA: III  Anesthesia Plan: General   Post-op Pain Management:    Induction: Intravenous  Airway Management Planned: Oral ETT  Additional Equipment:   Intra-op Plan:   Post-operative Plan: Extubation in OR  Informed Consent: I have reviewed the patients History and Physical, chart, labs and discussed the procedure including the risks, benefits and alternatives for the proposed anesthesia with the patient or authorized representative who has indicated his/her understanding and acceptance.   Dental advisory given  Plan Discussed with: CRNA and Surgeon  Anesthesia Plan Comments: (Plan routine monitors, GETA)        Anesthesia Quick Evaluation

## 2014-08-22 NOTE — Op Note (Signed)
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  #2 posterior lumbar interbody fusion using the globus rise 1117 expandable 4 lordosis expandable cage system packed with locally harvested autograft mixed with allostem at L2-3  #3 exploration of fusion removal of hardware L3-4 with removal of bilateral L4 screws rods cross-link and top tightening nuts  #4 placement of bilateral L2 pedicle screws using the Medtronic Legacy 6.35 pedicle screw set with 6 5 x 40 screws at L2 1 50 mm rod and non-breakoff top tightening nuts anchoring into the previously placed L3 screws. Plan  #5 posterior lateral arthrodesis L2-3 using locally harvested autograft mixed with allostem  Surgeon: Dominica Severin Kayo Zion  Asst.: Newman Pies  Anesthesia: Gen.  EBL: Minimal  History of present illness: Patient is a very pleasant 69 year old female who previously undergone an L3 S1 fusion a staged fashion most recent operation been L3 for fusion who developed progressive worsening back and leg pain has gotten worse over last year to her cup revealed a herniated disc at L2-3 lumbar instability at L2-3 with movement on flexion extension films and severely spinal stenosis at L2-3. Due to patient's failed conservative treatment imaging findings and progression of clinical syndrome I recommended reexploration of fusion with removal of hardware L3-4 and posterior lumbar interbody fusion at L2-3. I extensively reviewed the risks and benefits of the operation with the patient as well as perioperative course expectations of outcome and alternatives of surgery and she understood and agreed to proceed forward.  Operative  procedure: Patient brought in the or was induced on general anesthesia positioned prone the Wilson frame his back was prepped and draped in routine sterile fashion. Her old incision was opened up and extended slightly cephalad scar tissues dissected free and subperiosteal dissections care and lamina of L2 and L3 exposing the TPs at L2 bilaterally the hardware was then exposed the the Crossing was removed the nuts remove the rods removed aspect of the fusion the fusion was solid so I removed the L4 screws and I removed the spinous processes at L3 the complete central decompression work into the scar tissue and freeing up the inferomedial aspect of the facet joints of the complete medial facetectomies with radical laminotomy to the L2 nerve root. Extending down also tracked out the L3 nerve root to the level of inferior aspect of the pedicle. Then exposed lateral aspect despite aggressively under biting the superior tickling facet complex. Correlate with the epidural veins cleaned out the disc bilaterally put in sequential distraction with an 11 distractor inserted selected out 11-17 expandable cages. After adequate endplate preparation been achieved and the disc been cleaned out removing a large free fragment herniation underneath the 2 root on the right I then inserted the cage and expanded approximately 3 turns which expanded to think about 12 mm. Then cleaned out the contralateral side packed locally harvested autograft next centrally inserted the contralateral cage and expanded similar fashion. At this point I placed both pedicle screws using a high-speed drill pilot holes were drilled pedicles were cannulated probe Probed again and 6 5 x 40 screws were placed using fluoroscopy each step along the way. All screws excellent purchase. Fluoroscopy confirmed all screws to be in good position as well as the interbody implants. Then  the wound scope was irrigated meticulous in space was maintained aggressive  decortication was care MTPs or lateral gutters posterior laterally from the locally harvested autograft mixed was impacted posterior laterally from L2-L3 and then I attached the 50 mm rods and top tightening nuts every and torqued in place placed a large director reinspected the foramina to confirm patency. Then closed with layers with after Vicryl running 4 septic particular the skin Dermabond benzo and Steri-Strips were applied patient recovered in stable condition. At the end of case on it counts sponge counts were correct.

## 2014-08-22 NOTE — Anesthesia Postprocedure Evaluation (Signed)
  Anesthesia Post-op Note  Patient: Denise [name]  Procedure(s) Performed: Procedure(s): Posterior Lumbar Interbody Fusion Lumbar two-Lumbar three, Removal of Hardware Lumbar three-Sacral One (N/A)  Patient Location: PACU  Anesthesia Type:General  Level of Consciousness: sedated, patient cooperative and responds to stimulation and voice  Airway and Oxygen Therapy: Patient Spontanous Breathing and Patient connected to nasal cannula oxygen  Post-op Pain: mild  Post-op Assessment: Post-op Vital signs reviewed, Patient's Cardiovascular Status Stable, Respiratory Function Stable, Patent Airway, No signs of Nausea or vomiting and Pain level controlled, able to doze  Post-op Vital Signs: Reviewed and stable  Last Vitals:  Filed Vitals:   08/22/14 1601  BP: 124/52  Pulse: 72  Temp: 36.6 C  Resp: 16    Complications: No apparent anesthesia complications

## 2014-08-22 NOTE — Anesthesia Procedure Notes (Signed)
Procedure Name: Intubation Date/Time: 08/22/2014 10:01 AM Performed by: Clearnce Sorrel Pre-anesthesia Checklist: Patient identified, Emergency Drugs available, Suction available and Patient being monitored Patient Re-evaluated:Patient Re-evaluated prior to inductionOxygen Delivery Method: Circle system utilized Preoxygenation: Pre-oxygenation with 100% oxygen Intubation Type: IV induction Ventilation: Mask ventilation without difficulty Laryngoscope Size: Mac and 3 Grade View: Grade I Tube type: Oral Endobronchial tube: EBT position confirmed by auscultation Tube size: 7.0 mm Number of attempts: 1 Airway Equipment and Method: Stylet and Oral airway Placement Confirmation: ETT inserted through vocal cords under direct vision,  positive ETCO2 and breath sounds checked- equal and bilateral Secured at: 22 cm Tube secured with: Tape Dental Injury: Teeth and Oropharynx as per pre-operative assessment
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
## 2014-08-22 NOTE — Transfer of Care (Signed)
Immediate Anesthesia Transfer of Care Note  Patient: Denise [name]  Procedure(s) Performed: Procedure(s): Posterior Lumbar Interbody Fusion Lumbar two-Lumbar three, Removal of Hardware Lumbar three-Sacral One (N/A)  Patient Location: PACU  Anesthesia Type:General  Level of Consciousness: awake, sedated and patient cooperative  Airway & Oxygen Therapy: Patient Spontanous Breathing and Patient connected to face mask oxygen  Post-op Assessment: Report given to RN, Post -op Vital signs reviewed and stable and Patient moving all extremities X 4  Post vital signs: Reviewed  Last Vitals:  Filed Vitals:   08/22/14 0747  BP: 164/68  Pulse: 66  Temp: 36.9 C  Resp: 18    Complications: No apparent anesthesia complications

## 2014-08-23 LAB — GLUCOSE, CAPILLARY
GLUCOSE-CAPILLARY: 100 mg/dL — AB (ref 70–99)
GLUCOSE-CAPILLARY: 106 mg/dL — AB (ref 70–99)
Glucose-Capillary: 100 mg/dL — ABNORMAL HIGH (ref 70–99)
Glucose-Capillary: 99 mg/dL (ref 70–99)
Glucose-Capillary: 96 mg/dL (ref 70–99)

## 2014-08-23 NOTE — Progress Notes (Signed)
Utilization review completed.  

## 2014-08-23 NOTE — Evaluation (Signed)
Physical Therapy Evaluation Patient Details Name: Denise [name] MRN: [id number] DOB: [birth year] Today's Date: 08/23/2014   History of Present Illness  s/p Posterior Lumbar Interbody Fusion Lumbar two-Lumbar three, Removal of Hardware Lumbar three-Sacral One   Clinical Impression  Pt will be going to SNF tomorrow if all goes well, very motivated and able to assist her to BR and walk short trips.  Her plan is to get home but has limited help there and stairs for all levels of house.  Will continue tomorrow with plan to try stairs if able.    Follow Up Recommendations SNF;Supervision/Assistance - 24 hour    Equipment Recommendations  3in1 (PT)    Recommendations for Other Services       Precautions / Restrictions Precautions Precautions: Back Precaution Booklet Issued: Yes (comment) Precaution Comments: reviewed using teach back and handout Required Braces or Orthoses: Spinal Brace Spinal Brace: Lumbar corset Spinal Brace Comments: no brace order but brace in room. Per MD note pt is to have brace on when mobilizing. Restrictions Weight Bearing Restrictions: No      Mobility  Bed Mobility Overal bed mobility: Needs Assistance Bed Mobility: Rolling;Sidelying to Sit Rolling: Min guard Sidelying to sit: Min guard       General bed mobility comments: up when PT entered  Transfers Overall transfer level: Needs assistance Equipment used: Rolling walker (2 wheeled) Transfers: Sit to/from [facility] Sit to Stand: Min guard Stand pivot transfers: Min guard       General transfer comment: Cues for body mechanics and hand placement  Ambulation/Gait Ambulation/Gait assistance: Min guard Ambulation Distance (Feet): 100 Feet Assistive device: Rolling walker (2 wheeled) Gait Pattern/deviations: Step-through pattern;Decreased dorsiflexion - right;Decreased dorsiflexion - left;Decreased stride length;Wide base of support Gait velocity: reduced Gait  velocity interpretation: Below normal speed for age/gender General Gait Details: Pt is slowing down as she walks and due to her increasing discomfort PT limited her distance  Stairs            Wheelchair Mobility    Modified Rankin (Stroke Patients Only)       Balance Overall balance assessment: Needs assistance Sitting-balance support: Feet supported Sitting balance-Leahy Scale: Fair Sitting balance - Comments: donned brace EOB Postural control: Posterior lean Standing balance support: Bilateral upper extremity supported Standing balance-Leahy Scale: Poor                               Pertinent Vitals/Pain Pain Assessment: 0-10 Pain Score: 5  Pain Location: back Pain Intervention(s): Premedicated before session;Monitored during session;Limited activity within patient's tolerance;Repositioned;Other (comment) (instructed body mechanics)    Home Living Family/patient expects to be discharged to:: Skilled nursing facility Living Arrangements: Children               Additional Comments: lives with son who works during the day    Prior Function Level of Independence: Independent with assistive device(s)         Comments: used cane, rw     Hand Dominance        Extremity/Trunk Assessment   Upper Extremity Assessment: Overall WFL for tasks assessed           Lower Extremity Assessment: Overall WFL for tasks assessed      Cervical / Trunk Assessment: Normal  Communication   Communication: No difficulties  Cognition Arousal/Alertness: Awake/alert Behavior During Therapy: WFL for tasks assessed/performed Overall Cognitive Status: Within Functional Limits for tasks assessed  General Comments General comments (skin integrity, edema, etc.): Pt in chair with more extended posture, instructed her to use enough pillows to maintain her upright sitting posture.      Exercises        Assessment/Plan    PT  Assessment Patient needs continued PT services  PT Diagnosis Acute pain;Abnormality of gait   PT Problem List Decreased strength;Decreased range of motion;Decreased activity tolerance;Decreased balance;Decreased mobility;Decreased coordination;Decreased knowledge of use of DME;Decreased knowledge of precautions;Pain;Decreased skin integrity;Obesity  PT Treatment Interventions DME instruction;Gait training;Stair training;Functional mobility training;Therapeutic activities;Therapeutic exercise;Balance training;Neuromuscular re-education;Patient/family education   PT Goals (Current goals can be found in the Care Plan section) Acute Rehab PT Goals Patient Stated Goal: [place] then home PT Goal Formulation: With patient Time For Goal Achievement: 09/06/14 Potential to Achieve Goals: Good    Frequency Min 3X/week   Barriers to discharge Inaccessible home environment;Decreased caregiver support stairs one rail and limited assistance    Co-evaluation               End of Session Equipment Utilized During Treatment: Gait belt Activity Tolerance: Patient limited by pain;Patient limited by fatigue Patient left: in chair;with call bell/phone within reach;with chair alarm set Nurse Communication: Mobility status         Time: 0076-2263 PT Time Calculation (min) (ACUTE ONLY): 17 min   Charges:   PT Evaluation $Initial PT Evaluation Tier I: 1 Procedure     PT G CodesRamond Dial 09-17-2014, 11:44 AM   Mee Hives, PT MS Acute Rehab Dept. Number: [phone]

## 2014-08-23 NOTE — Evaluation (Signed)
Occupational Therapy Evaluation Patient Details Name: Denise [name] MRN: [id number] DOB: November 28, 1945 Today's Date: 08/23/2014    History of Present Illness s/p Posterior Lumbar Interbody Fusion Lumbar two-Lumbar three, Removal of Hardware Lumbar three-Sacral One    Clinical Impression   Pt admitted with the above diagnoses and presents with below problem list. Pt will benefit from continued acute OT to address the below listed deficits and maximize independence with BADLs prior to d/c to next venue. PTA pt was mod I with ADLs. Pt currently is min guard to min A with ADLs. Pt with decreased caregiver support, recommending SNF for ST rehab before returning home.      Follow Up Recommendations  SNF    Equipment Recommendations  Other (comment) (defer to next venue)    Recommendations for Other Services PT consult     Precautions / Restrictions Precautions Precautions: Back Precaution Booklet Issued: Yes (comment) Precaution Comments: reviewed using teach back and handout Required Braces or Orthoses: Spinal Brace Spinal Brace: Lumbar corset Spinal Brace Comments: no brace order but brace in room. Per MD note pt is to have brace on when mobilizing. Restrictions Weight Bearing Restrictions: No      Mobility Bed Mobility Overal bed mobility: Needs Assistance Bed Mobility: Rolling;Sidelying to Sit Rolling: Min guard Sidelying to sit: Min guard       General bed mobility comments: HOB flat. Min guard for safety and cues for technique.  Transfers Overall transfer level: Needs assistance Equipment used: Rolling walker (2 wheeled) Transfers: Sit to/from [facility] Sit to Stand: Min guard Stand pivot transfers: Min guard       General transfer comment: from EOB and from recliner. cues for placement of hands with rw.    Balance Overall balance assessment: Needs assistance Sitting-balance support: No upper extremity supported;Feet  supported Sitting balance-Leahy Scale: Fair Sitting balance - Comments: donned brace EOB   Standing balance support: Bilateral upper extremity supported;During functional activity Standing balance-Leahy Scale: Poor                              ADL Overall ADL's : Needs assistance/impaired Eating/Feeding: Set up;Sitting   Grooming: Set up;Sitting   Upper Body Bathing: Minimal assitance;Sitting   Lower Body Bathing: Min guard;With adaptive equipment;Sit to/from stand   Upper Body Dressing : Min guard;Sitting   Lower Body Dressing: Minimal assistance;Sit to/from stand   Toilet Transfer: Min guard;Stand-pivot;BSC;RW   Toileting- Water quality scientist and Hygiene: Sit to/from stand;Minimal assistance   Tub/ Shower Transfer: Min guard;Stand-pivot;3 in Optometrist ADL Comments: Pt complete logrolling and completed SPT from EOB to recliner as detailed below. Reviewed BAT precautions and techniques and AE to complete ADLs. Pt has 5 steps to enter home and has no assist during daytime hours. Recommending SNf at d/c.     Vision     Perception     Praxis      Pertinent Vitals/Pain Pain Assessment: 0-10 Pain Score: 7  Pain Location: back Pain Intervention(s): Limited activity within patient's tolerance;Monitored during session;Repositioned;Patient requesting pain meds-RN notified;Utilized relaxation techniques     Hand Dominance     Extremity/Trunk Assessment Upper Extremity Assessment Upper Extremity Assessment: Overall WFL for tasks assessed   Lower Extremity Assessment Lower Extremity Assessment: Defer to PT evaluation       Communication Communication Communication: No difficulties   Cognition Arousal/Alertness: Awake/alert Behavior During Therapy: WFL for tasks assessed/performed Overall Cognitive Status: Within  Functional Limits for tasks assessed                     General Comments       Exercises       Shoulder  Instructions      Home Living Family/patient expects to be discharged to:: Skilled nursing facility Living Arrangements: Children                               Additional Comments: lives with son who works during the day      Prior Functioning/Environment Level of Independence: Independent with assistive device(s)        Comments: used cane, rw    OT Diagnosis: Acute pain   OT Problem List: Impaired balance (sitting and/or standing);Decreased knowledge of use of DME or AE;Decreased knowledge of precautions;Pain   OT Treatment/Interventions: Self-care/ADL training;Energy conservation;DME and/or AE instruction;Therapeutic activities;Patient/family education;Balance training    OT Goals(Current goals can be found in the care plan section) Acute Rehab OT Goals Patient Stated Goal: [place] then home OT Goal Formulation: With patient Time For Goal Achievement: 08/30/14 Potential to Achieve Goals: Good ADL Goals Pt Will Perform Grooming: with modified independence;with adaptive equipment;standing Pt Will Perform Upper Body Bathing: with modified independence;with adaptive equipment;sitting Pt Will Perform Lower Body Bathing: with modified independence;with adaptive equipment;sit to/from stand Pt Will Perform Upper Body Dressing: with modified independence;with adaptive equipment;sitting Pt Will Perform Lower Body Dressing: with modified independence;with adaptive equipment;sit to/from stand Pt Will Transfer to Toilet: with modified independence;ambulating;bedside commode Pt Will Perform Toileting - Clothing Manipulation and hygiene: with modified independence;with adaptive equipment;sit to/from stand  OT Frequency: Min 2X/week   Barriers to D/C: Decreased caregiver support          Co-evaluation              End of Session Equipment Utilized During Treatment: Rolling walker;Back brace Nurse Communication: Patient requests pain meds  Activity  Tolerance: Patient tolerated treatment well Patient left: in chair;with call bell/phone within reach;with chair alarm set   Time: 1155-2080 OT Time Calculation (min): 32 min Charges:  OT General Charges $OT Visit: 1 Procedure OT Evaluation $Initial OT Evaluation Tier I: 1 Procedure OT Treatments $Self Care/Home Management : 8-22 mins G-Codes:    Hortencia Pilar 2014/09/19, 11:28 AM

## 2014-08-23 NOTE — Progress Notes (Signed)
Subjective: Patient reports Doing well condition back pain but no leg pain  Objective: Vital signs in last 24 hours: Temp:  [97.6 F (36.4 C)-98.4 F (36.9 C)] 98.3 F (36.8 C) (04/26 0555) Pulse Rate:  [66-84] 80 (04/26 0555) Resp:  [13-39] 16 (04/26 0555) BP: (124-174)/(52-91) 132/57 mmHg (04/26 0555) SpO2:  [94 %-100 %] 95 % (04/26 0555) Weight:  [112.4 kg (247 lb 12.8 oz)-112.492 kg (248 lb)] 112.4 kg (247 lb 12.8 oz) (04/25 1601)  Intake/Output from previous day: 04/25 0701 - 04/26 0700 In: [place] [P.O.:120; I.V.:1700] Out: 2290 [Urine:1650; Drains:440; Blood:200] Intake/Output this shift:    Strength out of 5 wound clean dry and intact  Lab Results: No results for input(s): WBC, HGB, HCT, PLT in the last 72 hours. BMET No results for input(s): NA, K, CL, CO2, GLUCOSE, BUN, CREATININE, CALCIUM in the last 72 hours.  Studies/Results: Dg Lumbar Spine 2-3 Views  08/22/2014   CLINICAL DATA:  Posterior lumbar fusion of L2-3.  EXAM: LUMBAR SPINE - 2-3 VIEW; DG C-ARM 61-120 MIN  COMPARISON:  MRI of July 16, 2014.  Fluoroscopy time:  33 seconds.  FINDINGS: Two intraoperative fluoroscopic images of the lumbar spine demonstrate the patient be status post posterior surgical fusion of L2-3 with bilateral intrapedicular screw placement. Interbody spacer is noted. Previous interbody fusion of L3-4 is noted as well. Intrapedicular screws from prior L3-4 fusion have been removed.  IMPRESSION: Status post posterior surgical fusion of L2-3.   Electronically Signed   By: Marijo Conception, M.D.   On: 08/22/2014 13:21   Dg C-arm 1-60 Min  08/22/2014   CLINICAL DATA:  Posterior lumbar fusion of L2-3.  EXAM: LUMBAR SPINE - 2-3 VIEW; DG C-ARM 61-120 MIN  COMPARISON:  MRI of July 16, 2014.  Fluoroscopy time:  33 seconds.  FINDINGS: Two intraoperative fluoroscopic images of the lumbar spine demonstrate the patient be status post posterior surgical fusion of L2-3 with bilateral intrapedicular screw  placement. Interbody spacer is noted. Previous interbody fusion of L3-4 is noted as well. Intrapedicular screws from prior L3-4 fusion have been removed.  IMPRESSION: Status post posterior surgical fusion of L2-3.   Electronically Signed   By: Marijo Conception, M.D.   On: 08/22/2014 13:21    Assessment/Plan: Mobilized today with physical and occupational therapy in her brace  LOS: 1 day     Milt Coye P 08/23/2014, 7:30 AM

## 2014-08-24 LAB — GLUCOSE, CAPILLARY
GLUCOSE-CAPILLARY: 121 mg/dL — AB (ref 70–99)
Glucose-Capillary: 96 mg/dL (ref 70–99)
Glucose-Capillary: 114 mg/dL — ABNORMAL HIGH (ref 70–99)
Glucose-Capillary: 81 mg/dL (ref 70–99)

## 2014-08-24 NOTE — Progress Notes (Signed)
Subjective: Patient reports Doing well condition of back pain but no leg pain ambulating and voiding  Objective: Vital signs in last 24 hours: Temp:  [98.3 F (36.8 C)-99.6 F (37.6 C)] 98.6 F (37 C) (04/27 0947) Pulse Rate:  [73-86] 80 (04/27 0947) Resp:  [18-20] 20 (04/27 0947) BP: (104-155)/(43-67) 143/67 mmHg (04/27 0947) SpO2:  [91 %-98 %] 91 % (04/27 0947)  Intake/Output from previous day: 04/26 0701 - 04/27 0700 In: 600 [P.O.:600] Out: 245 [Urine:110; Drains:135] Intake/Output this shift: Total I/O In: 240 [P.O.:240] Out: -   Strength appears 5 out of 5 but patient was being employed by the physical therapist so unable to assess her incision  Lab Results: No results for input(s): WBC, HGB, HCT, PLT in the last 72 hours. BMET No results for input(s): NA, K, CL, CO2, GLUCOSE, BUN, CREATININE, CALCIUM in the last 72 hours.  Studies/Results: Dg Lumbar Spine 2-3 Views  08/22/2014   CLINICAL DATA:  Posterior lumbar fusion of L2-3.  EXAM: LUMBAR SPINE - 2-3 VIEW; DG C-ARM 61-120 MIN  COMPARISON:  MRI of July 16, 2014.  Fluoroscopy time:  33 seconds.  FINDINGS: Two intraoperative fluoroscopic images of the lumbar spine demonstrate the patient be status post posterior surgical fusion of L2-3 with bilateral intrapedicular screw placement. Interbody spacer is noted. Previous interbody fusion of L3-4 is noted as well. Intrapedicular screws from prior L3-4 fusion have been removed.  IMPRESSION: Status post posterior surgical fusion of L2-3.   Electronically Signed   By: Marijo Conception, M.D.   On: 08/22/2014 13:21   Dg C-arm 1-60 Min  08/22/2014   CLINICAL DATA:  Posterior lumbar fusion of L2-3.  EXAM: LUMBAR SPINE - 2-3 VIEW; DG C-ARM 61-120 MIN  COMPARISON:  MRI of July 16, 2014.  Fluoroscopy time:  33 seconds.  FINDINGS: Two intraoperative fluoroscopic images of the lumbar spine demonstrate the patient be status post posterior surgical fusion of L2-3 with bilateral intrapedicular  screw placement. Interbody spacer is noted. Previous interbody fusion of L3-4 is noted as well. Intrapedicular screws from prior L3-4 fusion have been removed.  IMPRESSION: Status post posterior surgical fusion of L2-3.   Electronically Signed   By: Marijo Conception, M.D.   On: 08/22/2014 13:21    Assessment/Plan: Continue to mobilize with physical and occupational therapy working on rehabilitation placement  LOS: 2 days     Denise [name] P 08/24/2014, 10:19 AM

## 2014-08-24 NOTE — Clinical Social Work Placement (Signed)
   CLINICAL SOCIAL WORK PLACEMENT  NOTE  Date:  08/24/2014  Patient Details  Name: Denise [name] MRN: [id number] Date of Birth: [birth year]  Clinical Social Work is seeking post-discharge placement for this patient at the [place] level of care (*CSW will initial, date and re-position this form in  chart as items are completed):  Yes   Patient/family provided with [place] Work Department's list of facilities offering this level of care within the geographic area requested by the patient (or if unable, by the patient's family).  Yes   Patient/family informed of their freedom to choose among providers that offer the needed level of care, that participate in Medicare, Medicaid or managed care program needed by the patient, have an available bed and are willing to accept the patient.  Yes   Patient/family informed of [place]'s ownership interest in [facility] and [facility], as well as of the fact that they are under no obligation to receive care at these facilities.  PASRR submitted to EDS on 08/24/14     PASRR number received on       Existing PASRR number confirmed on 08/24/14     FL2 transmitted to all facilities in geographic area requested by pt/family on 08/24/14     FL2 transmitted to all facilities within larger geographic area on    Patient informed that his/her managed care company has contracts with or will negotiate with certain facilities, including the following:   (YES)     Yes   Patient/family informed of bed offers received.  Patient chooses bed at       Physician recommends and patient chooses bed at      Patient to be transferred to   on  .  Patient to be transferred to facility by       Patient family notified on   of transfer.  Name of family member notified:        PHYSICIAN Please sign FL2     Additional Comment:    _______________________________________________ Glendon Axe, MSW,  [place] [phone] 08/24/2014 11:44 AM

## 2014-08-24 NOTE — Evaluation (Signed)
Physical Therapy Evaluation Patient Details Name: Denise [name] MRN: [id number] DOB: [birth year] Today's Date: 08/24/2014   History of Present Illness  s/p Posterior Lumbar Interbody Fusion Lumbar two-Lumbar three, Removal of Hardware Lumbar three-Sacral One   Clinical Impression  Patient progressing with mobility, performed stair negotiation requiring assist. Continued cues for back precautions during session. Will continue with current POC.    Follow Up Recommendations SNF;Supervision/Assistance - 24 hour    Equipment Recommendations  3in1 (PT)    Recommendations for Other Services       Precautions / Restrictions Precautions Precautions: Back Precaution Booklet Issued: Yes (comment) Precaution Comments: reviewed using teach back and handout Required Braces or Orthoses: Spinal Brace Spinal Brace: Lumbar corset Spinal Brace Comments: no brace order but brace in room. Per MD note pt is to have brace on when mobilizing. Restrictions Weight Bearing Restrictions: No      Mobility  Bed Mobility Overal bed mobility: Needs Assistance             General bed mobility comments: up when PT entered (in bathroom with nsg)  Transfers Overall transfer level: Needs assistance Equipment used: Rolling walker (2 wheeled) Transfers: Sit to/from Stand Sit to Stand: Min guard         General transfer comment: Cues for body mechanics and hand placement  Ambulation/Gait Ambulation/Gait assistance: Min guard Ambulation Distance (Feet): 140 Feet Assistive device: Rolling walker (2 wheeled) Gait Pattern/deviations: Step-through pattern;Decreased dorsiflexion - right;Decreased dorsiflexion - left;Decreased stride length;Wide base of support Gait velocity: reduced Gait velocity interpretation: Below normal speed for age/gender General Gait Details: slow but steady with gait, some cues for compliance with precautions  Stairs Stairs: Yes Stairs assistance: Min assist Stair  Management: Two rails;Step to pattern;Forwards Number of Stairs: 5 General stair comments: VCs for sequencing using step to method for pain control   Wheelchair Mobility    Modified Rankin (Stroke Patients Only)       Balance   Sitting-balance support: Feet supported Sitting balance-Leahy Scale: Fair Sitting balance - Comments: donned brace EOB   Standing balance support: Bilateral upper extremity supported Standing balance-Leahy Scale: Poor                               Pertinent Vitals/Pain Pain Assessment: 0-10 Pain Score: 4  Pain Location: back Pain Intervention(s): Monitored during session;Repositioned    Home Living                        Prior Function                 Hand Dominance        Extremity/Trunk Assessment                         Communication      Cognition Arousal/Alertness: Awake/alert Behavior During Therapy: WFL for tasks assessed/performed Overall Cognitive Status: Within Functional Limits for tasks assessed                      General Comments      Exercises        Assessment/Plan    PT Assessment    PT Diagnosis     PT Problem List    PT Treatment Interventions     PT Goals (Current goals can be found in the Care Plan section) Acute Rehab PT Goals Patient Stated Goal:  [place] then home PT Goal Formulation: With patient Time For Goal Achievement: 09/06/14 Potential to Achieve Goals: Good    Frequency Min 3X/week   Barriers to discharge        Co-evaluation               End of Session Equipment Utilized During Treatment: Gait belt Activity Tolerance: Patient limited by pain;Patient limited by fatigue Patient left: in chair;with call bell/phone within reach;with chair alarm set Nurse Communication: Mobility status         Time: 9539-6728 PT Time Calculation (min) (ACUTE ONLY): 22 min   Charges:     PT Treatments $Gait Training: 8-22 mins   PT  G CodesDuncan Dull 2014/09/02, 11:47 AM Alben Deeds, PT DPT  [phone]

## 2014-08-24 NOTE — Clinical Social Work Note (Signed)
Clinical Social Work Assessment  Patient Details  Name: Denise [name] MRN: [id number] Date of Birth: Jul 12, 1945  Date of referral:  08/24/14               Reason for consult:  Facility Placement                Permission sought to share information with:  Case Manager, Customer service manager, Family Supports  Housing/Transportation Living arrangements for the past 2 months:  [place] of Information:  Patient Patient Interpreter Needed:  None Criminal Activity/Legal Involvement Pertinent to Current Situation/Hospitalization:  No - Comment as needed Significant Relationships:  Adult Children, Other Family Members Lives with:  Adult Children , Meral Geissinger (son) Do you feel safe going back to the place where you live?  Yes Need for family participation in patient care:  Yes (Comment)  Care giving concerns:  Patient expressed concerns of returning home without therapy stating although she lives with her son; he works during the day and is not at home 24/7.    Social Worker assessment / plan: [place] representative met with patient at bedside in reference to post-acute placement for SNF. CSW introduced CSW role and SNF process. CSW also reviewed and provided SNF list. Pt reported she was a resident at [facility] and [place] in 2012 and stated she would like to return if possible. CSW encouraged pt to choose another facility in the event [facility] does not have bed availability. Patient reported she is not familiar with other facilities however makes reference to [facility]. Pt stated she would review SNF list closely and choose another facility as a backup. Pt stated her ultimate goal is to return home with her son and being able to care for herself. No further concerns reported by pt at this time. CSW encouraged pt to contact CSW with additional questions and or concerns. CSW will continue to follow pt and pt's family for continued support and to  facilitate pt's discharge needs once medically stable.   CSW to complete FL-2 and fax to SNF's in [place] area and place on chart for MD signature. CSW remains available as needed.   Employment status:  Disabled (Comment on whether or not currently receiving Disability) Insurance information:  Medicare  PT Recommendations:  [place] / Referral to community resources:  [place]  Patient/Family's Response to care:  Pt pleasant and agreeable to SNF placement. Pt stated she is feeling well and smiling during assessment.   Patient/Family's Understanding of and Emotional Response to Diagnosis, Current Treatment, and Prognosis:  Pt explained to CSW diagnosis and expressed understanding of care stating staff are keeping her updated.   Emotional Assessment Appearance:  Appears younger than stated age Attitude/Demeanor/Rapport:  Other (Pleasant ) Affect (typically observed):  Accepting, Happy, Stable, Hopeful Orientation:  Oriented to Self, Oriented to Place, Oriented to  Time, Oriented to Situation Alcohol / Substance use:  Never Used Psych involvement (Current and /or in the community):  No (Comment)  Discharge Needs  Concerns to be addressed:  Denies Needs/Concerns at this time Readmission within the last 30 days:  No Current discharge risk:  None Barriers to Discharge:  No Barriers Identified  Glendon Axe, MSW, LCSWA [phone] 08/24/2014 11:21 AM

## 2014-08-25 LAB — GLUCOSE, CAPILLARY
GLUCOSE-CAPILLARY: 82 mg/dL (ref 70–99)
GLUCOSE-CAPILLARY: 103 mg/dL — AB (ref 70–99)
Glucose-Capillary: 83 mg/dL (ref 70–99)

## 2014-08-25 NOTE — Progress Notes (Signed)
Subjective: Patient reports Doing well back pain well-controlled no leg pain strength out of 5 wound clean dry and intact  Objective: Vital signs in last 24 hours: Temp:  [98 F (36.7 C)-100.9 F (38.3 C)] 100.9 F (38.3 C) (04/28 0547) Pulse Rate:  [80-89] 84 (04/28 0547) Resp:  [16-20] 16 (04/28 0547) BP: (129-156)/(58-69) 129/58 mmHg (04/28 0547) SpO2:  [91 %-97 %] 97 % (04/28 0547)  Intake/Output from previous day: 04/27 0701 - 04/28 0700 In: 600 [P.O.:600] Out: 140 [Drains:140] Intake/Output this shift:    Strength out of 5  Lab Results: No results for input(s): WBC, HGB, HCT, PLT in the last 72 hours. BMET No results for input(s): NA, K, CL, CO2, GLUCOSE, BUN, CREATININE, CALCIUM in the last 72 hours.  Studies/Results: No results found.  Assessment/Plan: Continue physical occupational therapy continue to use her incentive spirometer to bring her temperature down if she defervesced this intense days down she can transfer to a skilled nursing facility when the bed becomes available.  LOS: 3 days     Andree Heeg P 08/25/2014, 7:25 AM

## 2014-08-25 NOTE — Progress Notes (Signed)
Physical Therapy Treatment Patient Details Name: Denise [name] MRN: [id number] DOB: Oct 15, 1945 Today's Date: 08/25/2014    History of Present Illness s/p Posterior Lumbar Interbody Fusion Lumbar two-Lumbar three, Removal of Hardware Lumbar three-Sacral One     PT Comments    Patient progressing with mobility, continues to require cues for safety and compliance at times. Ambulated in hall with RW.  Continue with current POC.  Follow Up Recommendations  SNF;Supervision/Assistance - 24 hour     Equipment Recommendations  3in1 (PT)    Recommendations for Other Services       Precautions / Restrictions Precautions Precautions: Back Precaution Booklet Issued: Yes (comment) Precaution Comments: reviewed using teach back and handout Required Braces or Orthoses: Spinal Brace Spinal Brace: Lumbar corset Spinal Brace Comments: no brace order but brace in room. Per MD note pt is to have brace on when mobilizing. Restrictions Weight Bearing Restrictions: No    Mobility  Bed Mobility Overal bed mobility: Needs Assistance Bed Mobility: Rolling;Sit to Sidelying         Sit to sidelying: Min guard General bed mobility comments: VCs for technique upon return to bed  Transfers Overall transfer level: Needs assistance Equipment used: Rolling walker (2 wheeled) Transfers: Sit to/from Stand Sit to Stand: Supervision         General transfer comment: Cues for body mechanics and hand placement  Ambulation/Gait Ambulation/Gait assistance: Supervision Ambulation Distance (Feet): 260 Feet Assistive device: Rolling walker (2 wheeled) Gait Pattern/deviations: Step-through pattern;Decreased dorsiflexion - right;Decreased dorsiflexion - left;Decreased stride length;Wide base of support Gait velocity: reduced Gait velocity interpretation: Below normal speed for age/gender General Gait Details: continued cues for increased cadence and normalized gait   Stairs             Wheelchair Mobility    Modified Rankin (Stroke Patients Only)       Balance     Sitting balance-Leahy Scale: Fair       Standing balance-Leahy Scale: Fair                      Cognition Arousal/Alertness: Awake/alert Behavior During Therapy: WFL for tasks assessed/performed Overall Cognitive Status: Within Functional Limits for tasks assessed                      Exercises      General Comments        Pertinent Vitals/Pain Pain Assessment: 0-10 Pain Score: 5  Pain Location: back Pain Descriptors / Indicators: Sore Pain Intervention(s): Monitored during session    Home Living                      Prior Function            PT Goals (current goals can now be found in the care plan section) Acute Rehab PT Goals Patient Stated Goal: [place] then home PT Goal Formulation: With patient Time For Goal Achievement: 09/06/14 Potential to Achieve Goals: Good Progress towards PT goals: Progressing toward goals    Frequency  Min 5X/week    PT Plan Current plan remains appropriate;Frequency needs to be updated    Co-evaluation             End of Session Equipment Utilized During Treatment: Gait belt Activity Tolerance: Patient limited by pain;Patient limited by fatigue Patient left: in bed;with call bell/phone within reach     Time: 1352-1410 PT Time Calculation (min) (ACUTE ONLY): 18 min  Charges:  $  Gait Training: 8-22 mins                    G CodesDuncan Dull Sep 08, 2014, 3:15 PM Alben Deeds, [place] DPT  [phone]

## 2014-08-25 NOTE — Progress Notes (Signed)
CARE MANAGEMENT NOTE 08/25/2014  Patient:  Denise [name], Denise [name]   Account Number:  [id number]  Date Initiated:  08/25/2014  Documentation initiated by:  Lorne Skeens  Subjective/Objective Assessment:   Patient was admitted for a PLIF. Lives at home with children.     Action/Plan:   will follow for discharge needs pending PT/OT evals and physician orders.   Anticipated DC Date:  08/26/2014   Anticipated DC Plan:  SKILLED NURSING FACILITY  In-house referral  Clinical Social Worker      DC Planning Services  CM consult      Choice offered to / List presented to:             Status of service:  Completed, signed off Medicare Important Message given?  YES (If response is "NO", the following Medicare IM given date fields will be blank) Date Medicare IM given:  08/25/2014 Medicare IM given by:  Lorne Skeens Date Additional Medicare IM given:   Additional Medicare IM given by:    Discharge Disposition:  [place]  Per UR Regulation:  Reviewed for med. necessity/level of care/duration of stay  If discussed at [place] of Stay Meetings, dates discussed:    Comments:  08/25/14 [place], MSN, CM- Medicare IM letter provided.

## 2014-08-25 NOTE — Clinical Social Work Note (Signed)
Patient has a bed at [facility] [place]. Facility liaison has completed paperwork at bedside with patient.   FL-2 on chart for MD signature.   Glendon Axe, MSW, LCSWA [phone] 08/25/2014 1:49 PM

## 2014-08-25 NOTE — Progress Notes (Signed)
UR complete.  Supriya Beaston RN, MSN 

## 2014-08-26 DIAGNOSIS — Z4789 Encounter for other orthopedic aftercare: Secondary | ICD-10-CM | POA: Diagnosis not present

## 2014-08-26 DIAGNOSIS — J309 Allergic rhinitis, unspecified: Secondary | ICD-10-CM | POA: Diagnosis not present

## 2014-08-26 DIAGNOSIS — R531 Weakness: Secondary | ICD-10-CM | POA: Diagnosis not present

## 2014-08-26 DIAGNOSIS — E785 Hyperlipidemia, unspecified: Secondary | ICD-10-CM | POA: Diagnosis not present

## 2014-08-26 DIAGNOSIS — M4800 Spinal stenosis, site unspecified: Secondary | ICD-10-CM | POA: Diagnosis not present

## 2014-08-26 DIAGNOSIS — J45909 Unspecified asthma, uncomplicated: Secondary | ICD-10-CM | POA: Diagnosis not present

## 2014-08-26 DIAGNOSIS — M6281 Muscle weakness (generalized): Secondary | ICD-10-CM | POA: Diagnosis not present

## 2014-08-26 DIAGNOSIS — E119 Type 2 diabetes mellitus without complications: Secondary | ICD-10-CM | POA: Diagnosis not present

## 2014-08-26 DIAGNOSIS — I10 Essential (primary) hypertension: Secondary | ICD-10-CM | POA: Diagnosis not present

## 2014-08-26 DIAGNOSIS — D62 Acute posthemorrhagic anemia: Secondary | ICD-10-CM | POA: Diagnosis not present

## 2014-08-26 DIAGNOSIS — R2689 Other abnormalities of gait and mobility: Secondary | ICD-10-CM | POA: Diagnosis not present

## 2014-08-26 DIAGNOSIS — R278 Other lack of coordination: Secondary | ICD-10-CM | POA: Diagnosis not present

## 2014-08-26 DIAGNOSIS — M159 Polyosteoarthritis, unspecified: Secondary | ICD-10-CM | POA: Diagnosis not present

## 2014-08-26 DIAGNOSIS — J45901 Unspecified asthma with (acute) exacerbation: Secondary | ICD-10-CM | POA: Diagnosis not present

## 2014-08-26 DIAGNOSIS — M4806 Spinal stenosis, lumbar region: Secondary | ICD-10-CM | POA: Diagnosis not present

## 2014-08-26 LAB — GLUCOSE, CAPILLARY
Glucose-Capillary: 86 mg/dL (ref 70–99)
Glucose-Capillary: 93 mg/dL (ref 70–99)

## 2014-08-26 MED ORDER — OXYCODONE-ACETAMINOPHEN 5-325 MG PO TABS: 1.0000 | ORAL_TABLET | ORAL | Status: DC | PRN

## 2014-08-26 NOTE — Progress Notes (Addendum)
Patient ID: Denise [name], female   DOB: 1945/10/09, 69 y.o.   MRN: [id number] Patient doing well no leg pain back pain well-controlled  Strength out of 5 wound clean dry and intact  Discharge to [facility]

## 2014-08-26 NOTE — Clinical Social Work Placement (Signed)
   CLINICAL SOCIAL WORK PLACEMENT  NOTE  Date:  08/26/2014  Patient Details  Name: Denise [name] MRN: [id number] Date of Birth: [birth year]  Clinical Social Work is seeking post-discharge placement for this patient at the [place] level of care (*CSW will initial, date and re-position this form in  chart as items are completed):  Yes   Patient/family provided with [place] Work Department's list of facilities offering this level of care within the geographic area requested by the patient (or if unable, by the patient's family).  Yes   Patient/family informed of their freedom to choose among providers that offer the needed level of care, that participate in Medicare, Medicaid or managed care program needed by the patient, have an available bed and are willing to accept the patient.  Yes   Patient/family informed of [place]'s ownership interest in [facility] and [facility], as well as of the fact that they are under no obligation to receive care at these facilities.  PASRR submitted to EDS on 08/24/14     PASRR number received on       Existing PASRR number confirmed on 08/24/14     FL2 transmitted to all facilities in geographic area requested by pt/family on 08/24/14     FL2 transmitted to all facilities within larger geographic area on       Patient informed that his/her managed care company has contracts with or will negotiate with certain facilities, including the following:   (YES)     Yes   Patient/family informed of bed offers received.  Patient chooses bed at  ([facility])     Physician recommends and patient chooses bed at      Patient to be transferred to  ([facility]) on 08/26/14.  Patient to be transferred to facility by  [facility])     Patient family notified on 08/26/14 of transfer.  Name of family member notified:   (Pt's son)     PHYSICIAN Please sign FL2      Additional Comment:    _______________________________________________ Rozell Searing, LCSW 08/26/2014, 12:28 PM

## 2014-08-26 NOTE — Discharge Summary (Signed)
Physician Discharge Summary  Patient ID: Denise [name] MRN: [id number] DOB/AGE: 1946-04-16 69 y.o.  Admit date: 08/22/2014 Discharge date: 08/26/2014  Admission Diagnoses: Lumbar spinal stenosis L2-3 spinal instability L2-3 herniated nucleus pulposus L2-3  Discharge Diagnoses: Same Active Problems:   Spinal stenosis of lumbar region   Discharged Condition: good  Hospital Course: Patient is been hospital underwent an expiration of fusion removal of hardware L3-4 posterior lumbar interbody fusion L2-3 postoperative patient did very well with recovered in the floor on the floor she was ambulating and voiding tolerating regular diet pain was well managed and was stable for discharge to the nursing facility on postoperative day 4. She should be discharged on oxycodone for pain follow-up in 2 weeks may shower but keep the bandage dry during showers.  Consults: Significant Diagnostic Studies: Treatments: Posterior lumbar interbody fusion L2-3 Discharge Exam: Blood pressure 116/57, pulse 68, temperature 97.6 F (36.4 C), temperature source Axillary, resp. rate 20, height 5' 0.5" (1.537 m), weight 112.4 kg (247 lb 12.8 oz), SpO2 100 %. Strength out of 5 wound clean dry and intact  Dis[facility]     Medication List    TAKE these medications        ADVAIR DISKUS 100-50 MCG/DOSE Aepb  Generic drug:  Fluticasone-Salmeterol  INHALE ONE DOSE BY MOUTH TWICE DAILY     amLODipine 5 MG tablet  Commonly known as:  NORVASC  Take 1 tablet (5 mg total) by mouth daily.     aspirin 81 MG EC tablet  Take 81 mg by mouth daily.     atorvastatin 40 MG tablet  Commonly known as:  LIPITOR  Take 1 tablet (40 mg total) by mouth daily.     cetirizine 10 MG tablet  Commonly known as:  ZYRTEC  Take 1 tablet (10 mg total) by mouth daily as needed for allergies.     Fish Oil 500 MG Caps  Take by mouth 3 (three) times daily.     fluticasone 50 MCG/ACT nasal spray  Commonly known  as:  FLONASE  Place 2 sprays into both nostrils daily.     furosemide 40 MG tablet  Commonly known as:  LASIX  Take 1 tablet (40 mg total) by mouth 2 (two) times daily.     glimepiride 2 MG tablet  Commonly known as:  AMARYL  Take 1 tablet (2 mg total) by mouth daily with breakfast.     glucose blood test strip  Commonly known as:  FREESTYLE LITE  Use as instructed once daily     ibuprofen 800 MG tablet  Commonly known as:  ADVIL,MOTRIN  Take 1 tablet (800 mg total) by mouth every 8 (eight) hours as needed for pain. Use as directed for pain     levalbuterol 45 MCG/ACT inhaler  Commonly known as:  XOPENEX HFA  Inhale 2 puffs into the lungs daily.     lisinopril 40 MG tablet  Commonly known as:  PRINIVIL,ZESTRIL  Take 1 tablet (40 mg total) by mouth daily.     metFORMIN 500 MG 24 hr tablet  Commonly known as:  GLUCOPHAGE-XR  Take 1 tablet (500 mg total) by mouth 4 (four) times daily.     multivitamin capsule  Take 1 capsule by mouth daily.     oxyCODONE-acetaminophen 5-325 MG per tablet  Commonly known as:  PERCOCET/ROXICET  Take 1-2 tablets by mouth every 4 (four) hours as needed for moderate pain.     potassium chloride 10 MEQ CR capsule  Commonly known as:  MICRO-K  4 by mouth once daily     Vitamin D3 1000 UNITS Caps  Take 1 each by mouth daily.           Follow-up Information    Follow up with [facility], MD.   Specialty:  Neurosurgery   Contact information:   [address] [place] [address] [phone]       Signed: Elaina Hoops 08/26/2014, 9:32 AM

## 2014-08-26 NOTE — Progress Notes (Signed)
Physical Therapy Treatment Patient Details Name: Denise [name] MRN: [id number] DOB: 23-Oct-1945 Today's Date: 08/26/2014    History of Present Illness s/p Posterior Lumbar Interbody Fusion Lumbar two-Lumbar three, Removal of Hardware Lumbar three-Sacral One     PT Comments    Patient continues to progress with mobilization. Increased distance today and reviewed all precautions with good recall. Will continue to see and progress as tolerated.  Follow Up Recommendations  SNF;Supervision/Assistance - 24 hour     Equipment Recommendations  3in1 (PT)    Recommendations for Other Services       Precautions / Restrictions Precautions Precautions: Back Precaution Booklet Issued: Yes (comment) Precaution Comments: reviewed using teach back and handout Required Braces or Orthoses: Spinal Brace Spinal Brace: Lumbar corset Spinal Brace Comments: no brace order but brace in room. Per MD note pt is to have brace on when mobilizing. Restrictions Weight Bearing Restrictions: No    Mobility  Bed Mobility               General bed mobility comments: received in chair  Transfers Overall transfer level: Needs assistance Equipment used: Rolling walker (2 wheeled) Transfers: Sit to/from Stand Sit to Stand: Supervision         General transfer comment: Cues for body mechanics and hand placement  Ambulation/Gait Ambulation/Gait assistance: Supervision Ambulation Distance (Feet): 400 Feet Assistive device: Rolling walker (2 wheeled) Gait Pattern/deviations: Step-through pattern;Decreased dorsiflexion - right;Decreased dorsiflexion - left;Decreased stride length;Wide base of support Gait velocity: improving       Stairs            Wheelchair Mobility    Modified Rankin (Stroke Patients Only)       Balance     Sitting balance-Leahy Scale: Good       Standing balance-Leahy Scale: Fair                      Cognition Arousal/Alertness:  Awake/alert Behavior During Therapy: WFL for tasks assessed/performed Overall Cognitive Status: Within Functional Limits for tasks assessed                      Exercises      General Comments General comments (skin integrity, edema, etc.): reviewed precautions and answered all questions regarding mobility      Pertinent Vitals/Pain Pain Assessment: 0-10 Pain Score: 4  Pain Location: back Pain Descriptors / Indicators: Sore Pain Intervention(s): Monitored during session    Home Living                      Prior Function            PT Goals (current goals can now be found in the care plan section) Acute Rehab PT Goals Patient Stated Goal: [place] then home PT Goal Formulation: With patient Time For Goal Achievement: 09/06/14 Potential to Achieve Goals: Good Progress towards PT goals: Progressing toward goals    Frequency  Min 5X/week    PT Plan Current plan remains appropriate;Frequency needs to be updated    Co-evaluation             End of Session Equipment Utilized During Treatment: Gait belt Activity Tolerance: Patient limited by pain;Patient limited by fatigue Patient left: in bed;with call bell/phone within reach     Time: 1020-1038 PT Time Calculation (min) (ACUTE ONLY): 18 min  Charges:  $Gait Training: 8-22 mins  G CodesDuncan Dull 09-10-2014, 12:54 PM Alben Deeds, [place] DPT  [phone]

## 2014-08-26 NOTE — Discharge Instructions (Signed)
No lifting no bending no twisting no driving a riding a car unless she has come back and forth to see me. Keep incision clean dry and intact. May shower but cover the bandage in her back with Saran wrap for showers only. Continue the incentive spirometer every hour on the hour. While awake.

## 2014-08-26 NOTE — Progress Notes (Signed)
Report called and given to [employer], Therapist, sports at [employer]

## 2014-08-26 NOTE — Progress Notes (Signed)
Patient DC'd to [facility] via [facility].  Report called.  Patient vitals and assessments were stable.

## 2014-08-26 NOTE — Clinical Social Work Note (Signed)
Clinical Social Worker facilitated patient discharge including contacting patient family and facility to confirm patient discharge plans.  Clinical information faxed to facility and family agreeable with plan.  CSW arranged ambulance transport via [facility] to [facility] .  RN to call report prior to discharge.  Clinical Social Worker will sign off for now as social work intervention is no longer needed. Please consult [place] again if new need arises.  Glendon Axe, MSW, LCSWA [phone] 08/26/2014 12:29 PM

## 2014-08-28 ENCOUNTER — Encounter: Payer: Self-pay | Admitting: Internal Medicine

## 2014-08-29 ENCOUNTER — Encounter: Payer: Self-pay | Admitting: Registered Nurse

## 2014-08-29 ENCOUNTER — Non-Acute Institutional Stay (SKILLED_NURSING_FACILITY): Payer: Medicare Other | Admitting: Registered Nurse

## 2014-08-29 ENCOUNTER — Encounter: Payer: Self-pay | Admitting: Internal Medicine

## 2014-08-29 DIAGNOSIS — E785 Hyperlipidemia, unspecified: Secondary | ICD-10-CM

## 2014-08-29 DIAGNOSIS — I10 Essential (primary) hypertension: Secondary | ICD-10-CM | POA: Diagnosis not present

## 2014-08-29 DIAGNOSIS — M48061 Spinal stenosis, lumbar region without neurogenic claudication: Secondary | ICD-10-CM

## 2014-08-29 DIAGNOSIS — M4806 Spinal stenosis, lumbar region: Secondary | ICD-10-CM

## 2014-08-29 DIAGNOSIS — R531 Weakness: Secondary | ICD-10-CM

## 2014-08-29 DIAGNOSIS — J309 Allergic rhinitis, unspecified: Secondary | ICD-10-CM

## 2014-08-29 DIAGNOSIS — E119 Type 2 diabetes mellitus without complications: Secondary | ICD-10-CM | POA: Diagnosis not present

## 2014-08-29 DIAGNOSIS — J45909 Unspecified asthma, uncomplicated: Secondary | ICD-10-CM | POA: Diagnosis not present

## 2014-08-29 MED ORDER — METFORMIN HCL ER 500 MG PO TB24: ORAL_TABLET | ORAL | Status: DC
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
## 2014-08-30 ENCOUNTER — Encounter: Payer: Self-pay | Admitting: Gastroenterology

## 2014-08-31 ENCOUNTER — Encounter (HOSPITAL_COMMUNITY): Payer: Self-pay | Admitting: Neurosurgery

## 2014-09-01 ENCOUNTER — Non-Acute Institutional Stay (SKILLED_NURSING_FACILITY): Payer: Medicare Other | Admitting: Internal Medicine

## 2014-09-01 DIAGNOSIS — J45901 Unspecified asthma with (acute) exacerbation: Secondary | ICD-10-CM | POA: Diagnosis not present

## 2014-09-01 DIAGNOSIS — J309 Allergic rhinitis, unspecified: Secondary | ICD-10-CM

## 2014-09-01 DIAGNOSIS — E119 Type 2 diabetes mellitus without complications: Secondary | ICD-10-CM

## 2014-09-01 DIAGNOSIS — I10 Essential (primary) hypertension: Secondary | ICD-10-CM | POA: Diagnosis not present

## 2014-09-01 DIAGNOSIS — M4806 Spinal stenosis, lumbar region: Secondary | ICD-10-CM | POA: Diagnosis not present

## 2014-09-01 DIAGNOSIS — M48061 Spinal stenosis, lumbar region without neurogenic claudication: Secondary | ICD-10-CM
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
## 2014-09-05 LAB — CBC AND DIFFERENTIAL
HEMATOCRIT: 33 % — AB (ref 36–46)
Hemoglobin: 11 g/dL — AB (ref 12.0–16.0)
Platelets: 401 10*3/uL — AB (ref 150–399)
WBC: 10.2 10*3/mL

## 2014-09-05 LAB — BASIC METABOLIC PANEL
BUN: 9 mg/dL (ref 4–21)
Creatinine: 0.7 mg/dL (ref 0.5–1.1)
GLUCOSE: 82 mg/dL
Potassium: 4.1 mmol/L (ref 3.4–5.3)
SODIUM: 138 mmol/L (ref 137–147)

## 2014-09-07 ENCOUNTER — Non-Acute Institutional Stay (SKILLED_NURSING_FACILITY): Payer: Medicare Other | Admitting: Registered Nurse

## 2014-09-07 ENCOUNTER — Encounter: Payer: Self-pay | Admitting: Registered Nurse

## 2014-09-07 DIAGNOSIS — I10 Essential (primary) hypertension: Secondary | ICD-10-CM | POA: Diagnosis not present

## 2014-09-07 DIAGNOSIS — D62 Acute posthemorrhagic anemia: Secondary | ICD-10-CM | POA: Diagnosis not present

## 2014-09-07 DIAGNOSIS — J309 Allergic rhinitis, unspecified: Secondary | ICD-10-CM

## 2014-09-07 DIAGNOSIS — J45909 Unspecified asthma, uncomplicated: Secondary | ICD-10-CM | POA: Diagnosis not present

## 2014-09-07 DIAGNOSIS — E119 Type 2 diabetes mellitus without complications: Secondary | ICD-10-CM

## 2014-09-07 DIAGNOSIS — M48061 Spinal stenosis, lumbar region without neurogenic claudication: Secondary | ICD-10-CM

## 2014-09-07 DIAGNOSIS — M4806 Spinal stenosis, lumbar region: Secondary | ICD-10-CM

## 2014-09-07 DIAGNOSIS — E785 Hyperlipidemia, unspecified: Secondary | ICD-10-CM
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
## 2014-09-10 DIAGNOSIS — M5136 Other intervertebral disc degeneration, lumbar region: Secondary | ICD-10-CM | POA: Diagnosis not present

## 2014-09-10 DIAGNOSIS — M170 Bilateral primary osteoarthritis of knee: Secondary | ICD-10-CM | POA: Diagnosis not present

## 2014-09-10 DIAGNOSIS — Z4789 Encounter for other orthopedic aftercare: Secondary | ICD-10-CM | POA: Diagnosis not present

## 2014-09-10 DIAGNOSIS — E119 Type 2 diabetes mellitus without complications: Secondary | ICD-10-CM | POA: Diagnosis not present

## 2014-09-10 DIAGNOSIS — M6281 Muscle weakness (generalized): Secondary | ICD-10-CM | POA: Diagnosis not present

## 2014-09-10 DIAGNOSIS — M4806 Spinal stenosis, lumbar region: Secondary | ICD-10-CM | POA: Diagnosis not present

## 2014-09-13 DIAGNOSIS — E119 Type 2 diabetes mellitus without complications: Secondary | ICD-10-CM | POA: Diagnosis not present

## 2014-09-13 DIAGNOSIS — M4806 Spinal stenosis, lumbar region: Secondary | ICD-10-CM | POA: Diagnosis not present

## 2014-09-13 DIAGNOSIS — Z4789 Encounter for other orthopedic aftercare: Secondary | ICD-10-CM | POA: Diagnosis not present

## 2014-09-13 DIAGNOSIS — M6281 Muscle weakness (generalized): Secondary | ICD-10-CM | POA: Diagnosis not present

## 2014-09-13 DIAGNOSIS — M5136 Other intervertebral disc degeneration, lumbar region: Secondary | ICD-10-CM | POA: Diagnosis not present

## 2014-09-13 DIAGNOSIS — M170 Bilateral primary osteoarthritis of knee: Secondary | ICD-10-CM | POA: Diagnosis not present

## 2014-09-15 ENCOUNTER — Encounter: Payer: Self-pay | Admitting: Internal Medicine

## 2014-09-15 ENCOUNTER — Ambulatory Visit (INDEPENDENT_AMBULATORY_CARE_PROVIDER_SITE_OTHER): Payer: Medicare Other | Admitting: Internal Medicine

## 2014-09-15 VITALS — BP 134/88 | HR 78 | Temp 98.6°F | Wt 242.1 lb

## 2014-09-15 DIAGNOSIS — I10 Essential (primary) hypertension: Secondary | ICD-10-CM | POA: Diagnosis not present

## 2014-09-15 DIAGNOSIS — Z4789 Encounter for other orthopedic aftercare: Secondary | ICD-10-CM | POA: Diagnosis not present

## 2014-09-15 DIAGNOSIS — M170 Bilateral primary osteoarthritis of knee: Secondary | ICD-10-CM | POA: Diagnosis not present

## 2014-09-15 DIAGNOSIS — M5136 Other intervertebral disc degeneration, lumbar region: Secondary | ICD-10-CM | POA: Diagnosis not present

## 2014-09-15 DIAGNOSIS — E119 Type 2 diabetes mellitus without complications: Secondary | ICD-10-CM | POA: Diagnosis not present

## 2014-09-15 DIAGNOSIS — E785 Hyperlipidemia, unspecified: Secondary | ICD-10-CM

## 2014-09-15 DIAGNOSIS — M4806 Spinal stenosis, lumbar region: Secondary | ICD-10-CM | POA: Diagnosis not present

## 2014-09-15 DIAGNOSIS — M6281 Muscle weakness (generalized): Secondary | ICD-10-CM | POA: Diagnosis not present

## 2014-09-15 NOTE — Progress Notes (Signed)
Pre visit review using our clinic review tool, if applicable. No additional management support is needed unless otherwise documented below in the visit note. 
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
## 2014-09-15 NOTE — Patient Instructions (Signed)
Please continue all other medications as before, and refills have been done if requested.  Please have the pharmacy call with any other refills you may need.  Please continue your efforts at being more active, low cholesterol diet, and weight control.  Please keep your appointments with your specialists as you may have planned  Please return in 6 months, or sooner if needed 

## 2014-09-17 NOTE — Assessment & Plan Note (Signed)
stable overall by history and exam, recent data reviewed with pt, and pt to continue medical treatment as before,  to f/u any worsening symptoms or concerns Lab Results  Component Value Date   LDLCALC 93 08/09/2014

## 2014-09-17 NOTE — Assessment & Plan Note (Signed)
stable overall by history and exam, recent data reviewed with pt, and pt to continue medical treatment as before,  to f/u any worsening symptoms or concerns Lab Results  Component Value Date   HGBA1C 6.6* 08/09/2014

## 2014-09-17 NOTE — Assessment & Plan Note (Signed)
stable overall by history and exam, recent data reviewed with pt, and pt to continue medical treatment as before,  to f/u any worsening symptoms or concerns BP Readings from Last 3 Encounters:  09/15/14 134/88  09/07/14 138/74  09/01/14 170/85

## 2014-09-20 DIAGNOSIS — M170 Bilateral primary osteoarthritis of knee: Secondary | ICD-10-CM | POA: Diagnosis not present

## 2014-09-20 DIAGNOSIS — M5136 Other intervertebral disc degeneration, lumbar region: Secondary | ICD-10-CM | POA: Diagnosis not present

## 2014-09-20 DIAGNOSIS — Z4789 Encounter for other orthopedic aftercare: Secondary | ICD-10-CM | POA: Diagnosis not present

## 2014-09-20 DIAGNOSIS — M4806 Spinal stenosis, lumbar region: Secondary | ICD-10-CM | POA: Diagnosis not present

## 2014-09-20 DIAGNOSIS — M6281 Muscle weakness (generalized): Secondary | ICD-10-CM | POA: Diagnosis not present

## 2014-09-20 DIAGNOSIS — E119 Type 2 diabetes mellitus without complications: Secondary | ICD-10-CM | POA: Diagnosis not present

## 2014-09-22 DIAGNOSIS — M4806 Spinal stenosis, lumbar region: Secondary | ICD-10-CM | POA: Diagnosis not present

## 2014-09-22 DIAGNOSIS — M6281 Muscle weakness (generalized): Secondary | ICD-10-CM | POA: Diagnosis not present

## 2014-09-22 DIAGNOSIS — E119 Type 2 diabetes mellitus without complications: Secondary | ICD-10-CM | POA: Diagnosis not present

## 2014-09-22 DIAGNOSIS — M170 Bilateral primary osteoarthritis of knee: Secondary | ICD-10-CM | POA: Diagnosis not present

## 2014-09-22 DIAGNOSIS — Z4789 Encounter for other orthopedic aftercare: Secondary | ICD-10-CM | POA: Diagnosis not present

## 2014-09-22 DIAGNOSIS — M5136 Other intervertebral disc degeneration, lumbar region: Secondary | ICD-10-CM | POA: Diagnosis not present

## 2014-09-24 ENCOUNTER — Other Ambulatory Visit: Payer: Self-pay | Admitting: Internal Medicine

## 2014-09-27 DIAGNOSIS — M6281 Muscle weakness (generalized): Secondary | ICD-10-CM | POA: Diagnosis not present

## 2014-09-27 DIAGNOSIS — M170 Bilateral primary osteoarthritis of knee: Secondary | ICD-10-CM | POA: Diagnosis not present

## 2014-09-27 DIAGNOSIS — I10 Essential (primary) hypertension: Secondary | ICD-10-CM | POA: Diagnosis not present

## 2014-09-27 DIAGNOSIS — M5137 Other intervertebral disc degeneration, lumbosacral region: Secondary | ICD-10-CM | POA: Diagnosis not present

## 2014-09-27 DIAGNOSIS — Z4789 Encounter for other orthopedic aftercare: Secondary | ICD-10-CM | POA: Diagnosis not present

## 2014-09-27 DIAGNOSIS — M4806 Spinal stenosis, lumbar region: Secondary | ICD-10-CM | POA: Diagnosis not present

## 2014-09-27 DIAGNOSIS — Z6841 Body Mass Index (BMI) 40.0 and over, adult: Secondary | ICD-10-CM | POA: Diagnosis not present

## 2014-09-27 DIAGNOSIS — M5136 Other intervertebral disc degeneration, lumbar region: Secondary | ICD-10-CM | POA: Diagnosis not present

## 2014-09-27 DIAGNOSIS — E119 Type 2 diabetes mellitus without complications: Secondary | ICD-10-CM | POA: Diagnosis not present

## 2014-09-28 ENCOUNTER — Other Ambulatory Visit: Payer: Self-pay | Admitting: Internal Medicine

## 2014-09-29 DIAGNOSIS — M4806 Spinal stenosis, lumbar region: Secondary | ICD-10-CM | POA: Diagnosis not present

## 2014-09-29 DIAGNOSIS — E119 Type 2 diabetes mellitus without complications: Secondary | ICD-10-CM | POA: Diagnosis not present

## 2014-09-29 DIAGNOSIS — M5136 Other intervertebral disc degeneration, lumbar region: Secondary | ICD-10-CM | POA: Diagnosis not present

## 2014-09-29 DIAGNOSIS — Z4789 Encounter for other orthopedic aftercare: Secondary | ICD-10-CM | POA: Diagnosis not present

## 2014-09-29 DIAGNOSIS — M6281 Muscle weakness (generalized): Secondary | ICD-10-CM | POA: Diagnosis not present

## 2014-09-29 DIAGNOSIS — M170 Bilateral primary osteoarthritis of knee: Secondary | ICD-10-CM | POA: Diagnosis not present

## 2014-11-03 DIAGNOSIS — Z6841 Body Mass Index (BMI) 40.0 and over, adult: Secondary | ICD-10-CM | POA: Diagnosis not present

## 2014-11-03 DIAGNOSIS — I10 Essential (primary) hypertension: Secondary | ICD-10-CM | POA: Diagnosis not present

## 2014-11-03 DIAGNOSIS — M5137 Other intervertebral disc degeneration, lumbosacral region: Secondary | ICD-10-CM | POA: Diagnosis not present

## 2014-11-22 DIAGNOSIS — M545 Low back pain: Secondary | ICD-10-CM | POA: Diagnosis not present

## 2014-11-22 DIAGNOSIS — M5137 Other intervertebral disc degeneration, lumbosacral region: Secondary | ICD-10-CM | POA: Diagnosis not present

## 2014-11-22 DIAGNOSIS — M6281 Muscle weakness (generalized): Secondary | ICD-10-CM | POA: Diagnosis not present

## 2014-11-22 DIAGNOSIS — R262 Difficulty in walking, not elsewhere classified: Secondary | ICD-10-CM | POA: Diagnosis not present

## 2014-11-24 DIAGNOSIS — M5137 Other intervertebral disc degeneration, lumbosacral region: Secondary | ICD-10-CM | POA: Diagnosis not present

## 2014-11-24 DIAGNOSIS — R262 Difficulty in walking, not elsewhere classified: Secondary | ICD-10-CM | POA: Diagnosis not present

## 2014-11-24 DIAGNOSIS — M545 Low back pain: Secondary | ICD-10-CM | POA: Diagnosis not present

## 2014-11-24 DIAGNOSIS — M6281 Muscle weakness (generalized): Secondary | ICD-10-CM | POA: Diagnosis not present

## 2014-11-28 DIAGNOSIS — M545 Low back pain: Secondary | ICD-10-CM | POA: Diagnosis not present

## 2014-11-28 DIAGNOSIS — R262 Difficulty in walking, not elsewhere classified: Secondary | ICD-10-CM | POA: Diagnosis not present

## 2014-11-28 DIAGNOSIS — M6281 Muscle weakness (generalized): Secondary | ICD-10-CM | POA: Diagnosis not present

## 2014-11-28 DIAGNOSIS — M5137 Other intervertebral disc degeneration, lumbosacral region: Secondary | ICD-10-CM | POA: Diagnosis not present

## 2014-11-30 DIAGNOSIS — R262 Difficulty in walking, not elsewhere classified: Secondary | ICD-10-CM | POA: Diagnosis not present

## 2014-11-30 DIAGNOSIS — M6281 Muscle weakness (generalized): Secondary | ICD-10-CM | POA: Diagnosis not present

## 2014-11-30 DIAGNOSIS — M545 Low back pain: Secondary | ICD-10-CM | POA: Diagnosis not present

## 2014-11-30 DIAGNOSIS — M5137 Other intervertebral disc degeneration, lumbosacral region: Secondary | ICD-10-CM | POA: Diagnosis not present

## 2014-12-05 DIAGNOSIS — M6281 Muscle weakness (generalized): Secondary | ICD-10-CM | POA: Diagnosis not present

## 2014-12-05 DIAGNOSIS — M545 Low back pain: Secondary | ICD-10-CM | POA: Diagnosis not present

## 2014-12-05 DIAGNOSIS — M5137 Other intervertebral disc degeneration, lumbosacral region: Secondary | ICD-10-CM | POA: Diagnosis not present

## 2014-12-05 DIAGNOSIS — R262 Difficulty in walking, not elsewhere classified: Secondary | ICD-10-CM | POA: Diagnosis not present

## 2014-12-07 DIAGNOSIS — M5137 Other intervertebral disc degeneration, lumbosacral region: Secondary | ICD-10-CM | POA: Diagnosis not present

## 2014-12-07 DIAGNOSIS — M545 Low back pain: Secondary | ICD-10-CM | POA: Diagnosis not present

## 2014-12-07 DIAGNOSIS — M6281 Muscle weakness (generalized): Secondary | ICD-10-CM | POA: Diagnosis not present

## 2014-12-07 DIAGNOSIS — R262 Difficulty in walking, not elsewhere classified: Secondary | ICD-10-CM | POA: Diagnosis not present

## 2014-12-12 DIAGNOSIS — M545 Low back pain: Secondary | ICD-10-CM | POA: Diagnosis not present

## 2014-12-12 DIAGNOSIS — M6281 Muscle weakness (generalized): Secondary | ICD-10-CM | POA: Diagnosis not present

## 2014-12-12 DIAGNOSIS — R262 Difficulty in walking, not elsewhere classified: Secondary | ICD-10-CM | POA: Diagnosis not present

## 2014-12-12 DIAGNOSIS — M5137 Other intervertebral disc degeneration, lumbosacral region: Secondary | ICD-10-CM | POA: Diagnosis not present

## 2014-12-14 DIAGNOSIS — M6281 Muscle weakness (generalized): Secondary | ICD-10-CM | POA: Diagnosis not present

## 2014-12-14 DIAGNOSIS — R262 Difficulty in walking, not elsewhere classified: Secondary | ICD-10-CM | POA: Diagnosis not present

## 2014-12-14 DIAGNOSIS — M545 Low back pain: Secondary | ICD-10-CM | POA: Diagnosis not present

## 2014-12-14 DIAGNOSIS — M5137 Other intervertebral disc degeneration, lumbosacral region: Secondary | ICD-10-CM | POA: Diagnosis not present

## 2014-12-23 DIAGNOSIS — M6281 Muscle weakness (generalized): Secondary | ICD-10-CM | POA: Diagnosis not present

## 2014-12-23 DIAGNOSIS — R262 Difficulty in walking, not elsewhere classified: Secondary | ICD-10-CM | POA: Diagnosis not present

## 2014-12-23 DIAGNOSIS — M5137 Other intervertebral disc degeneration, lumbosacral region: Secondary | ICD-10-CM | POA: Diagnosis not present

## 2014-12-23 DIAGNOSIS — M545 Low back pain: Secondary | ICD-10-CM | POA: Diagnosis not present

## 2014-12-26 DIAGNOSIS — M5137 Other intervertebral disc degeneration, lumbosacral region: Secondary | ICD-10-CM | POA: Diagnosis not present

## 2014-12-26 DIAGNOSIS — M6281 Muscle weakness (generalized): Secondary | ICD-10-CM | POA: Diagnosis not present

## 2014-12-26 DIAGNOSIS — M545 Low back pain: Secondary | ICD-10-CM | POA: Diagnosis not present

## 2014-12-26 DIAGNOSIS — R262 Difficulty in walking, not elsewhere classified: Secondary | ICD-10-CM | POA: Diagnosis not present

## 2014-12-28 DIAGNOSIS — R262 Difficulty in walking, not elsewhere classified: Secondary | ICD-10-CM | POA: Diagnosis not present

## 2014-12-28 DIAGNOSIS — M545 Low back pain: Secondary | ICD-10-CM | POA: Diagnosis not present

## 2014-12-28 DIAGNOSIS — M6281 Muscle weakness (generalized): Secondary | ICD-10-CM | POA: Diagnosis not present

## 2014-12-28 DIAGNOSIS — M5137 Other intervertebral disc degeneration, lumbosacral region: Secondary | ICD-10-CM | POA: Diagnosis not present

## 2015-01-04 ENCOUNTER — Other Ambulatory Visit: Payer: Self-pay | Admitting: Internal Medicine

## 2015-01-04 DIAGNOSIS — M545 Low back pain: Secondary | ICD-10-CM | POA: Diagnosis not present

## 2015-01-04 DIAGNOSIS — M6281 Muscle weakness (generalized): Secondary | ICD-10-CM | POA: Diagnosis not present

## 2015-01-04 DIAGNOSIS — M5137 Other intervertebral disc degeneration, lumbosacral region: Secondary | ICD-10-CM | POA: Diagnosis not present

## 2015-01-04 DIAGNOSIS — R262 Difficulty in walking, not elsewhere classified: Secondary | ICD-10-CM | POA: Diagnosis not present

## 2015-01-06 DIAGNOSIS — M545 Low back pain: Secondary | ICD-10-CM | POA: Diagnosis not present

## 2015-01-06 DIAGNOSIS — M6281 Muscle weakness (generalized): Secondary | ICD-10-CM | POA: Diagnosis not present

## 2015-01-06 DIAGNOSIS — M5137 Other intervertebral disc degeneration, lumbosacral region: Secondary | ICD-10-CM | POA: Diagnosis not present

## 2015-01-06 DIAGNOSIS — R262 Difficulty in walking, not elsewhere classified: Secondary | ICD-10-CM | POA: Diagnosis not present

## 2015-01-11 DIAGNOSIS — M6281 Muscle weakness (generalized): Secondary | ICD-10-CM | POA: Diagnosis not present

## 2015-01-11 DIAGNOSIS — R262 Difficulty in walking, not elsewhere classified: Secondary | ICD-10-CM | POA: Diagnosis not present

## 2015-01-11 DIAGNOSIS — M545 Low back pain: Secondary | ICD-10-CM | POA: Diagnosis not present

## 2015-01-11 DIAGNOSIS — M5137 Other intervertebral disc degeneration, lumbosacral region: Secondary | ICD-10-CM | POA: Diagnosis not present

## 2015-01-13 DIAGNOSIS — M545 Low back pain: Secondary | ICD-10-CM | POA: Diagnosis not present

## 2015-01-13 DIAGNOSIS — M5137 Other intervertebral disc degeneration, lumbosacral region: Secondary | ICD-10-CM | POA: Diagnosis not present

## 2015-01-13 DIAGNOSIS — R262 Difficulty in walking, not elsewhere classified: Secondary | ICD-10-CM | POA: Diagnosis not present

## 2015-01-13 DIAGNOSIS — M6281 Muscle weakness (generalized): Secondary | ICD-10-CM | POA: Diagnosis not present

## 2015-01-23 DIAGNOSIS — M6281 Muscle weakness (generalized): Secondary | ICD-10-CM | POA: Diagnosis not present

## 2015-01-23 DIAGNOSIS — R262 Difficulty in walking, not elsewhere classified: Secondary | ICD-10-CM | POA: Diagnosis not present

## 2015-01-23 DIAGNOSIS — M5137 Other intervertebral disc degeneration, lumbosacral region: Secondary | ICD-10-CM | POA: Diagnosis not present

## 2015-01-23 DIAGNOSIS — M545 Low back pain: Secondary | ICD-10-CM | POA: Diagnosis not present

## 2015-01-25 DIAGNOSIS — R262 Difficulty in walking, not elsewhere classified: Secondary | ICD-10-CM | POA: Diagnosis not present

## 2015-01-25 DIAGNOSIS — M545 Low back pain: Secondary | ICD-10-CM | POA: Diagnosis not present

## 2015-01-25 DIAGNOSIS — M5137 Other intervertebral disc degeneration, lumbosacral region: Secondary | ICD-10-CM | POA: Diagnosis not present

## 2015-01-25 DIAGNOSIS — M6281 Muscle weakness (generalized): Secondary | ICD-10-CM | POA: Diagnosis not present

## 2015-01-30 DIAGNOSIS — M5137 Other intervertebral disc degeneration, lumbosacral region: Secondary | ICD-10-CM | POA: Diagnosis not present

## 2015-01-30 DIAGNOSIS — M6281 Muscle weakness (generalized): Secondary | ICD-10-CM | POA: Diagnosis not present

## 2015-01-30 DIAGNOSIS — M545 Low back pain: Secondary | ICD-10-CM | POA: Diagnosis not present

## 2015-01-30 DIAGNOSIS — R262 Difficulty in walking, not elsewhere classified: Secondary | ICD-10-CM | POA: Diagnosis not present

## 2015-02-01 DIAGNOSIS — M545 Low back pain: Secondary | ICD-10-CM | POA: Diagnosis not present

## 2015-02-01 DIAGNOSIS — M5137 Other intervertebral disc degeneration, lumbosacral region: Secondary | ICD-10-CM | POA: Diagnosis not present

## 2015-02-01 DIAGNOSIS — R262 Difficulty in walking, not elsewhere classified: Secondary | ICD-10-CM | POA: Diagnosis not present

## 2015-02-01 DIAGNOSIS — M6281 Muscle weakness (generalized): Secondary | ICD-10-CM | POA: Diagnosis not present

## 2015-02-06 DIAGNOSIS — R262 Difficulty in walking, not elsewhere classified: Secondary | ICD-10-CM | POA: Diagnosis not present

## 2015-02-06 DIAGNOSIS — M6281 Muscle weakness (generalized): Secondary | ICD-10-CM | POA: Diagnosis not present

## 2015-02-06 DIAGNOSIS — M545 Low back pain: Secondary | ICD-10-CM | POA: Diagnosis not present

## 2015-02-06 DIAGNOSIS — M5137 Other intervertebral disc degeneration, lumbosacral region: Secondary | ICD-10-CM | POA: Diagnosis not present

## 2015-02-07 DIAGNOSIS — M5137 Other intervertebral disc degeneration, lumbosacral region: Secondary | ICD-10-CM | POA: Diagnosis not present

## 2015-02-15 ENCOUNTER — Ambulatory Visit (INDEPENDENT_AMBULATORY_CARE_PROVIDER_SITE_OTHER): Payer: Medicare Other | Admitting: Internal Medicine

## 2015-02-15 ENCOUNTER — Other Ambulatory Visit (INDEPENDENT_AMBULATORY_CARE_PROVIDER_SITE_OTHER): Payer: Medicare Other

## 2015-02-15 ENCOUNTER — Encounter: Payer: Self-pay | Admitting: Internal Medicine

## 2015-02-15 VITALS — BP 136/84 | HR 77 | Wt 245.0 lb

## 2015-02-15 DIAGNOSIS — M898X9 Other specified disorders of bone, unspecified site: Secondary | ICD-10-CM | POA: Diagnosis not present

## 2015-02-15 DIAGNOSIS — Z20828 Contact with and (suspected) exposure to other viral communicable diseases: Secondary | ICD-10-CM

## 2015-02-15 DIAGNOSIS — J209 Acute bronchitis, unspecified: Secondary | ICD-10-CM

## 2015-02-15 DIAGNOSIS — M810 Age-related osteoporosis without current pathological fracture: Secondary | ICD-10-CM | POA: Diagnosis not present

## 2015-02-15 DIAGNOSIS — E119 Type 2 diabetes mellitus without complications: Secondary | ICD-10-CM | POA: Diagnosis not present

## 2015-02-15 DIAGNOSIS — I10 Essential (primary) hypertension: Secondary | ICD-10-CM

## 2015-02-15 DIAGNOSIS — Z23 Encounter for immunization: Secondary | ICD-10-CM | POA: Diagnosis not present

## 2015-02-15 DIAGNOSIS — M8580 Other specified disorders of bone density and structure, unspecified site: Secondary | ICD-10-CM | POA: Diagnosis not present

## 2015-02-15 DIAGNOSIS — E785 Hyperlipidemia, unspecified: Secondary | ICD-10-CM

## 2015-02-15 LAB — CBC WITH DIFFERENTIAL/PLATELET
BASOS PCT: 1 % (ref 0.0–3.0)
Basophils Absolute: 0.1 10*3/uL (ref 0.0–0.1)
EOS PCT: 4.5 % (ref 0.0–5.0)
Eosinophils Absolute: 0.4 10*3/uL (ref 0.0–0.7)
HCT: 41 % (ref 36.0–46.0)
HEMOGLOBIN: 13.3 g/dL (ref 12.0–15.0)
Lymphocytes Relative: 33.9 % (ref 12.0–46.0)
Lymphs Abs: 3 10*3/uL (ref 0.7–4.0)
MCHC: 32.4 g/dL (ref 30.0–36.0)
MCV: 80 fl (ref 78.0–100.0)
MONOS PCT: 5.6 % (ref 3.0–12.0)
Monocytes Absolute: 0.5 10*3/uL (ref 0.1–1.0)
NEUTROS PCT: 55 % (ref 43.0–77.0)
Neutro Abs: 4.9 10*3/uL (ref 1.4–7.7)
Platelets: 215 10*3/uL (ref 150.0–400.0)
RBC: 5.13 Mil/uL — AB (ref 3.87–5.11)
RDW: 16.2 % — AB (ref 11.5–15.5)
WBC: 9 10*3/uL (ref 4.0–10.5)

## 2015-02-15 LAB — LIPID PANEL
Cholesterol: 145 mg/dL (ref 0–200)
HDL: 66.7 mg/dL (ref >39.00)
LDL Cholesterol: 66 mg/dL (ref 0–99)
NonHDL: 78.25
Total CHOL/HDL Ratio: 2
Triglycerides: 61 mg/dL (ref 0.0–149.0)
VLDL: 12.2 mg/dL (ref 0.0–40.0)

## 2015-02-15 LAB — URINALYSIS, ROUTINE W REFLEX MICROSCOPIC
BILIRUBIN URINE: NEGATIVE
HGB URINE DIPSTICK: NEGATIVE
Ketones, ur: NEGATIVE
LEUKOCYTES UA: NEGATIVE
NITRITE: NEGATIVE
Specific Gravity, Urine: 1.015 (ref 1.000–1.030)
Total Protein, Urine: NEGATIVE
Urine Glucose: NEGATIVE
Urobilinogen, UA: 0.2 (ref 0.0–1.0)
pH: 7.5 (ref 5.0–8.0)

## 2015-02-15 LAB — BASIC METABOLIC PANEL
BUN: 10 mg/dL (ref 6–23)
CHLORIDE: 106 meq/L (ref 96–112)
CO2: 29 meq/L (ref 19–32)
Calcium: 9.1 mg/dL (ref 8.4–10.5)
Creatinine, Ser: 0.68 mg/dL (ref 0.40–1.20)
GFR: 110.14 mL/min (ref >60.00)
GLUCOSE: 136 mg/dL — AB (ref 70–99)
POTASSIUM: 3.7 meq/L (ref 3.5–5.1)
SODIUM: 143 meq/L (ref 135–145)

## 2015-02-15 LAB — HEPATIC FUNCTION PANEL
ALBUMIN: 4.1 g/dL (ref 3.5–5.2)
ALK PHOS: 62 U/L (ref 39–117)
ALT: 19 U/L (ref 0–35)
AST: 18 U/L (ref 0–37)
BILIRUBIN TOTAL: 0.6 mg/dL (ref 0.2–1.2)
Bilirubin, Direct: 0.1 mg/dL (ref 0.0–0.3)
Total Protein: 7.4 g/dL (ref 6.0–8.3)

## 2015-02-15 LAB — HEMOGLOBIN A1C: HEMOGLOBIN A1C: 6.3 % (ref 4.6–6.5)

## 2015-02-15 LAB — MICROALBUMIN / CREATININE URINE RATIO
CREATININE, U: 111.3 mg/dL
MICROALB/CREAT RATIO: 1.9 mg/g (ref 0.0–30.0)
Microalb, Ur: 2.1 mg/dL — ABNORMAL HIGH (ref 0.0–1.9)

## 2015-02-15 LAB — TSH: TSH: 0.91 u[IU]/mL (ref 0.35–4.50)

## 2015-02-15 MED ORDER — PREDNISONE 10 MG PO TABS: ORAL_TABLET | ORAL | Status: DC

## 2015-02-15 MED ORDER — AZITHROMYCIN 250 MG PO TABS: ORAL_TABLET | ORAL | Status: DC
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
## 2015-02-15 NOTE — Progress Notes (Signed)
Pre visit review using our clinic review tool, if applicable. No additional management support is needed unless otherwise documented below in the visit note. 

## 2015-02-15 NOTE — Patient Instructions (Addendum)
Please take all new medication as prescribed- the antibiotic, and prednisone (few days only)  You had the pneumovax shot today  Please schedule the bone density test before leaving today at the scheduling desk (where you check out)  Please continue all other medications as before, and refills have been done if requested.  Please have the pharmacy call with any other refills you may need.  Please continue your efforts at being more active, low cholesterol diet, and weight control.  You are otherwise up to date with prevention measures today.  You will be contacted regarding the referral for: podiatry  Please keep your appointments with your specialists as you may have planned  Please go to the LAB in the Basement (turn left off the elevator) for the tests to be done today  You will be contacted by phone if any changes need to be made immediately.  Otherwise, you will receive a letter about your results with an explanation, but please check with MyChart first.  Please remember to sign up for MyChart if you have not done so, as this will be important to you in the future with finding out test results, communicating by private email, and scheduling acute appointments online when needed.  Please return in 6 months, or sooner if needed

## 2015-02-15 NOTE — Assessment & Plan Note (Signed)
stable overall by history and exam, recent data reviewed with pt, and pt to continue medical treatment as before,  to f/u any worsening symptoms or concerns Lab Results  Component Value Date   LDLCALC 93 08/09/2014   Consider change to crestor if goal < 70 not met

## 2015-02-15 NOTE — Addendum Note (Signed)
Addended by: Cresenciano Lick on: 02/15/2015 09:02 AM   Modules accepted: Orders

## 2015-02-15 NOTE — Assessment & Plan Note (Signed)
Mild to mod, for antibx course,  to f/u any worsening symptoms or concerns, also for predpac low dose few days only, cont adair

## 2015-02-15 NOTE — Assessment & Plan Note (Addendum)
stable overall by history and exam, recent data reviewed with pt, and pt to continue medical treatment as before,  to f/u any worsening symptoms or concerns Lab Results  Component Value Date   HGBA1C 6.6* 08/09/2014   For fu labs, refer podiatry

## 2015-02-15 NOTE — Assessment & Plan Note (Signed)
stable overall by history and exam, recent data reviewed with pt, and pt to continue medical treatment as before,  to f/u any worsening symptoms or concerns BP Readings from Last 3 Encounters:  02/15/15 136/84  09/15/14 134/88  09/07/14 138/74

## 2015-02-16 ENCOUNTER — Other Ambulatory Visit: Payer: Self-pay | Admitting: Internal Medicine

## 2015-02-16 LAB — HEPATITIS C ANTIBODY: HCV AB: NEGATIVE

## 2015-02-22 ENCOUNTER — Other Ambulatory Visit: Payer: Self-pay | Admitting: Internal Medicine

## 2015-02-23 MED ORDER — FLUTICASONE-SALMETEROL 100-50 MCG/DOSE IN AEPB: 1.0000 | INHALATION_SPRAY | Freq: Two times a day (BID) | RESPIRATORY_TRACT | Status: DC

## 2015-03-09 ENCOUNTER — Other Ambulatory Visit: Payer: Self-pay | Admitting: Internal Medicine

## 2015-03-09 NOTE — Telephone Encounter (Signed)
Please advise, thanks.

## 2015-03-27 ENCOUNTER — Other Ambulatory Visit: Payer: Self-pay | Admitting: Internal Medicine

## 2015-03-28 ENCOUNTER — Telehealth: Payer: Self-pay | Admitting: *Deleted

## 2015-03-28 ENCOUNTER — Encounter: Payer: Self-pay | Admitting: Podiatry

## 2015-03-28 ENCOUNTER — Ambulatory Visit (INDEPENDENT_AMBULATORY_CARE_PROVIDER_SITE_OTHER): Payer: Medicare Other | Admitting: Podiatry

## 2015-03-28 VITALS — BP 154/82 | HR 79 | Resp 12

## 2015-03-28 DIAGNOSIS — L608 Other nail disorders: Secondary | ICD-10-CM

## 2015-03-28 DIAGNOSIS — L609 Nail disorder, unspecified: Secondary | ICD-10-CM | POA: Diagnosis not present

## 2015-03-28 DIAGNOSIS — R0989 Other specified symptoms and signs involving the circulatory and respiratory systems: Secondary | ICD-10-CM | POA: Diagnosis not present

## 2015-03-28 NOTE — Addendum Note (Signed)
Addended by: Elta Guadeloupe on: 03/28/2015 04:20 PM   Modules accepted: Orders

## 2015-03-28 NOTE — Progress Notes (Signed)
   Subjective:    Patient ID: Denise [name], female    DOB: [birth year], 69 y.o.   MRN: [id number]  HPI   This patient presents today at the recommendation of her primary care physician and is requesting diabetic foot examination as well as debridement of toenails. She mentions some swelling and tingling occasionally in her feet on and off weightbearing. The symptoms have progressed gradually over time. Patient also describes visiting pedicures have toenails trimmed. She denies any podiatric care or evaluation Patient is type II diabetic and denies any history of ulceration, claudication or amputation  Review of Systems  Eyes: Positive for pain, redness, itching and visual disturbance.  Cardiovascular: Positive for leg swelling.  Musculoskeletal: Positive for joint swelling and gait problem.  Allergic/Immunologic: Positive for environmental allergies.       Objective:   Physical Exam  Orientated 3  Vascular: Nonpitting edema bilaterally DP pulses 2/4 bilaterally PT pulses 0/4 bilaterally Capillary reflex immediate bilaterally  Neurological: Sensation to 10 g monofilament wire intact 4/5 right 5/5 left Vibratory sensation reactive bilaterally Ankle reflex equal and reactive bilaterally  Dermatological: Texture and turgor within normal bilaterally The toenails are incurvated, elongated with occasional color changes 6-10 Course lateral fifth toes bilaterally  Musculoskeletal: Pes planus bilaterally HAV bilaterally There is no restriction in range of motion or crepitus in the ankle, subtalar, or midtarsal joints bilaterally       Assessment & Plan:   Assessment: Decreased posterior tibial pulses bilaterally Incurvated toenails 6-10 Protective sensation intact bilaterally Corns fifth toes bilaterally  Plan: Today I review the results of the examination with patient. I informed her that the posterior tibial pulses were difficult or nonpalpable and I want to  order a lower extremity arterial Doppler with ABIs and TBI's. I informed her that upon receipt of vascular lab we will give patient the results and any recommendations for follow-up as needed  Toenails 6-10 were debrided mechanically an electrical without a bleeding Keratoses 2 debrided without a bleeding  I advised patient if she wanted go to the pedicurist for nail trimming I advised no whirlpool or manipulation of the cuticles  Notify patient upon receipt of arterial Doppler with follow for vascular examination is indicated Otherwise follow-up when necessary or yearly The toenails 6-10 were debrided mechanically an electrical without a bleeding

## 2015-03-28 NOTE — Patient Instructions (Signed)
Today your diabetic foot examination demonstrated adequate feeling in your feet There was some difficulty in feeling pulse on the inside ankle area right and left feet and I will order a vascular examination. Our office will contact you with the results of the vascular lab. Okay to have nails trimmed the pedicurist without soaking in the whirlpool or manipulation of the cuticles Reappoint yearly or at your request  Diabetes and Foot Care Diabetes may cause you to have problems because of poor blood supply (circulation) to your feet and legs. This may cause the skin on your feet to become thinner, break easier, and heal more slowly. Your skin may become dry, and the skin may peel and crack. You may also have nerve damage in your legs and feet causing decreased feeling in them. You may not notice minor injuries to your feet that could lead to infections or more serious problems. Taking care of your feet is one of the most important things you can do for yourself.  HOME CARE INSTRUCTIONS  Wear shoes at all times, even in the house. Do not go barefoot. Bare feet are easily injured.  Check your feet daily for blisters, cuts, and redness. If you cannot see the bottom of your feet, use a mirror or ask someone for help.  Wash your feet with warm water (do not use hot water) and mild soap. Then pat your feet and the areas between your toes until they are completely dry. Do not soak your feet as this can dry your skin.  Apply a moisturizing lotion or petroleum jelly (that does not contain alcohol and is unscented) to the skin on your feet and to dry, brittle toenails. Do not apply lotion between your toes.  Trim your toenails straight across. Do not dig under them or around the cuticle. File the edges of your nails with an emery board or nail file.  Do not cut corns or calluses or try to remove them with medicine.  Wear clean socks or stockings every day. Make sure they are not too tight. Do not wear  knee-high stockings since they may decrease blood flow to your legs.  Wear shoes that fit properly and have enough cushioning. To break in new shoes, wear them for just a few hours a day. This prevents you from injuring your feet. Always look in your shoes before you put them on to be sure there are no objects inside.  Do not cross your legs. This may decrease the blood flow to your feet.  If you find a minor scrape, cut, or break in the skin on your feet, keep it and the skin around it clean and dry. These areas may be cleansed with mild soap and water. Do not cleanse the area with peroxide, alcohol, or iodine.  When you remove an adhesive bandage, be sure not to damage the skin around it.  If you have a wound, look at it several times a day to make sure it is healing.  Do not use heating pads or hot water bottles. They may burn your skin. If you have lost feeling in your feet or legs, you may not know it is happening until it is too late.  Make sure your health care provider performs a complete foot exam at least annually or more often if you have foot problems. Report any cuts, sores, or bruises to your health care provider immediately. SEEK MEDICAL CARE IF:   You have an injury that is not healing.  You have cuts or breaks in the skin.  You have an ingrown nail.  You notice redness on your legs or feet.  You feel burning or tingling in your legs or feet.  You have pain or cramps in your legs and feet.  Your legs or feet are numb.  Your feet always feel cold. SEEK IMMEDIATE MEDICAL CARE IF:   There is increasing redness, swelling, or pain in or around a wound.  There is a red line that goes up your leg.  Pus is coming from a wound.  You develop a fever or as directed by your health care provider.  You notice a bad smell coming from an ulcer or wound.   This information is not intended to replace advice given to you by your health care provider. Make sure you discuss any  questions you have with your health care provider.   Document Released: 04/12/2000 Document Revised: 12/16/2012 Document Reviewed: 09/22/2012 Elsevier Interactive Patient Education [employer].

## 2015-03-28 NOTE — Telephone Encounter (Signed)
Orders faxed to [facility]. 

## 2015-04-03 ENCOUNTER — Other Ambulatory Visit: Payer: Self-pay | Admitting: Internal Medicine

## 2015-04-06 ENCOUNTER — Ambulatory Visit (HOSPITAL_COMMUNITY)
Admission: RE | Admit: 2015-04-06 | Discharge: 2015-04-06 | Disposition: A | Discharge status: Home / Self Care | Payer: Medicare Other | Source: Ambulatory Visit | Attending: Cardiovascular Disease | Admitting: Cardiovascular Disease

## 2015-04-06 ENCOUNTER — Other Ambulatory Visit: Payer: Self-pay | Admitting: Podiatry

## 2015-04-06 DIAGNOSIS — E119 Type 2 diabetes mellitus without complications: Secondary | ICD-10-CM | POA: Insufficient documentation

## 2015-04-06 DIAGNOSIS — R0989 Other specified symptoms and signs involving the circulatory and respiratory systems: Secondary | ICD-10-CM

## 2015-04-06 DIAGNOSIS — I10 Essential (primary) hypertension: Secondary | ICD-10-CM | POA: Insufficient documentation

## 2015-04-06 DIAGNOSIS — E785 Hyperlipidemia, unspecified: Secondary | ICD-10-CM | POA: Insufficient documentation

## 2015-04-11 ENCOUNTER — Telehealth: Payer: Self-pay | Admitting: *Deleted

## 2015-04-11 NOTE — Telephone Encounter (Addendum)
-----   Message from Gean Birchwood, DPM sent at 04/11/2015  7:59 AM EST ----- Notes Recorded by Gean Birchwood, DPM on 04/11/2015 at 7:56 AM Results of lower extremity arterial Doppler dated 04/06/2015  No evidence of segmental lower extremity arterial disease at rest bilaterally Normal ABIs bilaterally Normal great toe brachial indices bilaterally PPG waveforms of all 10 toes are normal  Contact patient and advised her arterial examination was within normal limits and no follow-up needed.  Informed pt of Dr. Phoebe Perch review of dopplers.

## 2015-04-25 ENCOUNTER — Telehealth: Payer: Self-pay

## 2015-04-25 NOTE — Telephone Encounter (Signed)
Call to fup on AWV; the patient stated she was still working and did not have time this week and will start other job next Tuesday. Is an Therapist, sports; Reviewed Preventive health screens; Plan to have mammogram when time. Discussed dexa and her first one around the age of 76 was normal; Also discussed Shingles and stated medicare did not cover; referred to Part D side but has commercial; will call CS at commercial to see if they will pay. Can not schedule for AWV at this time

## 2015-04-30 DIAGNOSIS — Z923 Personal history of irradiation: Secondary | ICD-10-CM

## 2015-04-30 DIAGNOSIS — C50919 Malignant neoplasm of unspecified site of unspecified female breast: Secondary | ICD-10-CM

## 2015-04-30 HISTORY — DX: Personal history of irradiation: Z92.3

## 2015-04-30 HISTORY — DX: Malignant neoplasm of unspecified site of unspecified female breast: C50.919

## 2015-05-04 ENCOUNTER — Other Ambulatory Visit: Payer: Self-pay | Admitting: Internal Medicine

## 2015-05-09 DIAGNOSIS — M5126 Other intervertebral disc displacement, lumbar region: Secondary | ICD-10-CM | POA: Diagnosis not present

## 2015-05-09 DIAGNOSIS — M5137 Other intervertebral disc degeneration, lumbosacral region: Secondary | ICD-10-CM | POA: Diagnosis not present

## 2015-06-08 ENCOUNTER — Other Ambulatory Visit: Payer: Self-pay | Admitting: Internal Medicine

## 2015-06-12 DIAGNOSIS — M79641 Pain in right hand: Secondary | ICD-10-CM | POA: Diagnosis not present

## 2015-06-12 DIAGNOSIS — M65341 Trigger finger, right ring finger: Secondary | ICD-10-CM | POA: Diagnosis not present

## 2015-06-12 DIAGNOSIS — M79642 Pain in left hand: Secondary | ICD-10-CM | POA: Diagnosis not present

## 2015-06-12 DIAGNOSIS — M1812 Unilateral primary osteoarthritis of first carpometacarpal joint, left hand: Secondary | ICD-10-CM | POA: Diagnosis not present

## 2015-07-09 IMAGING — CR DG CHEST 2V
2 series · 2 of 2 positions shown · non-contrast
Comparison: January 06, 2012.

CLINICAL DATA: Shortness of breath, cough.

CHEST - 2 VIEW

[view not recorded (1 of 2)]
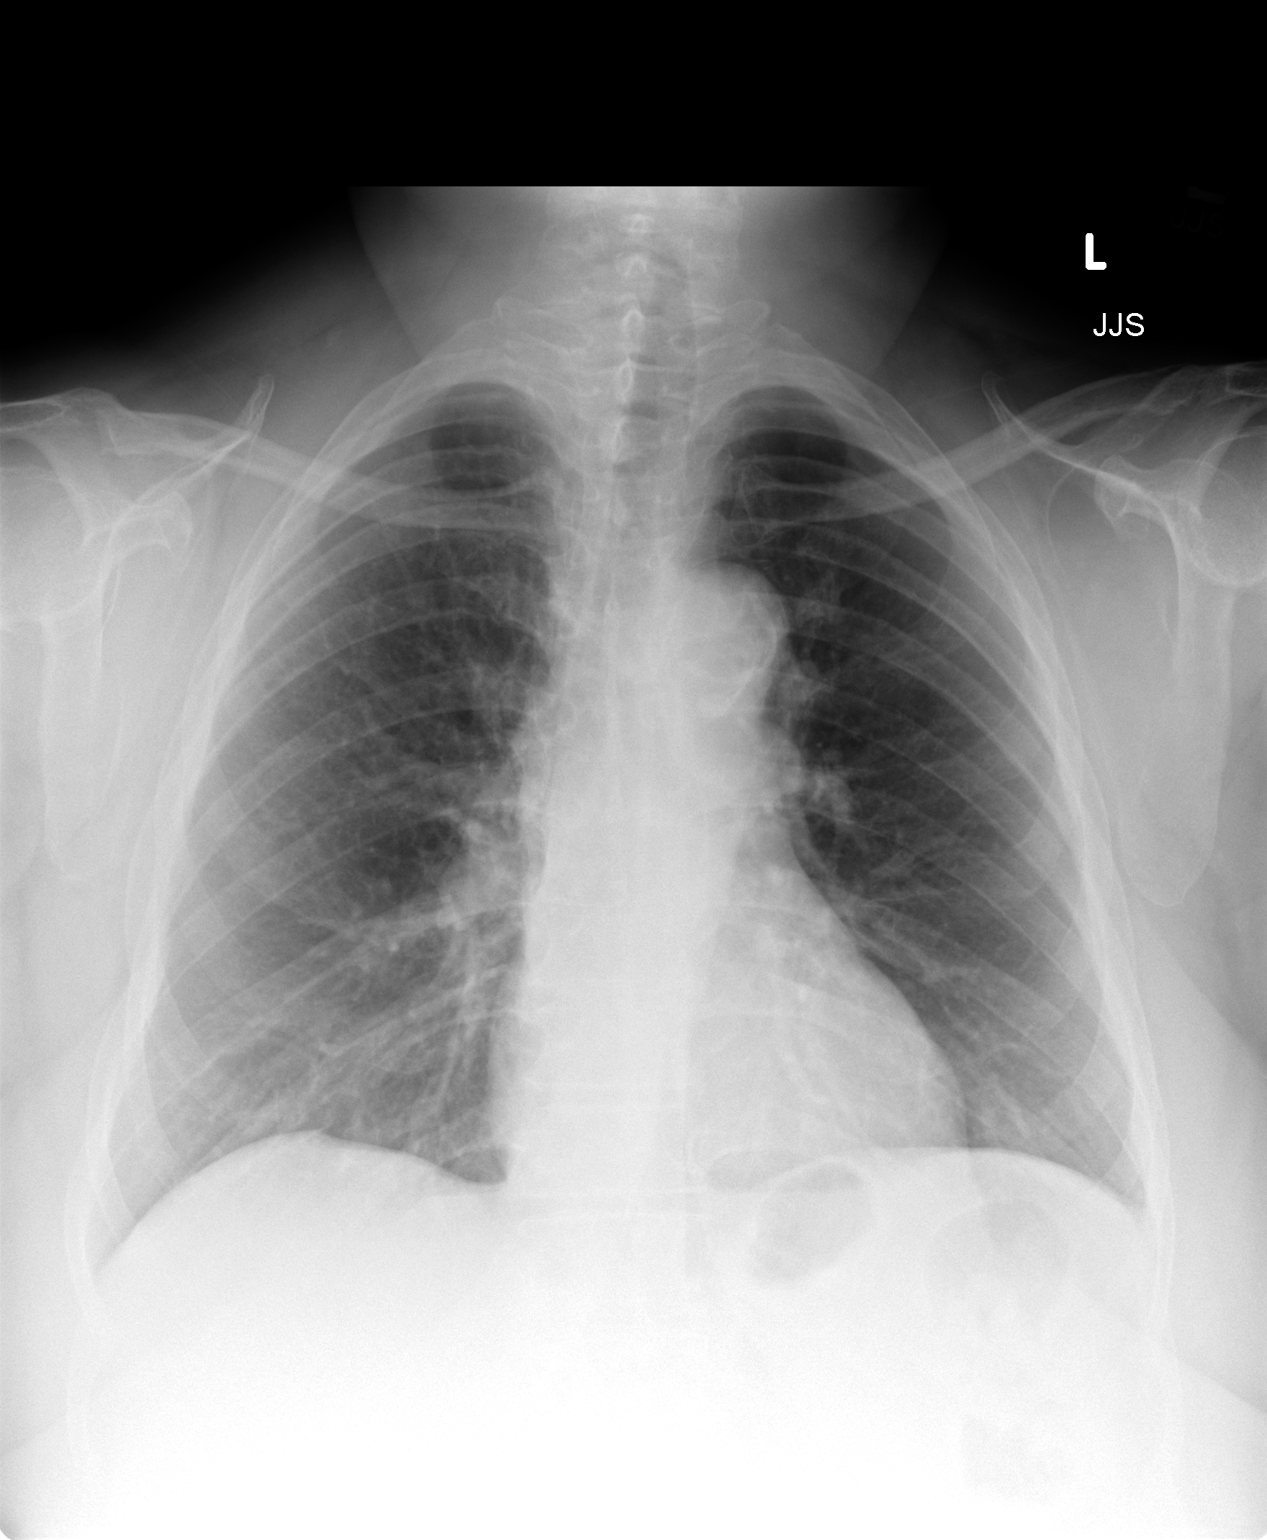

[view not recorded (2 of 2)]
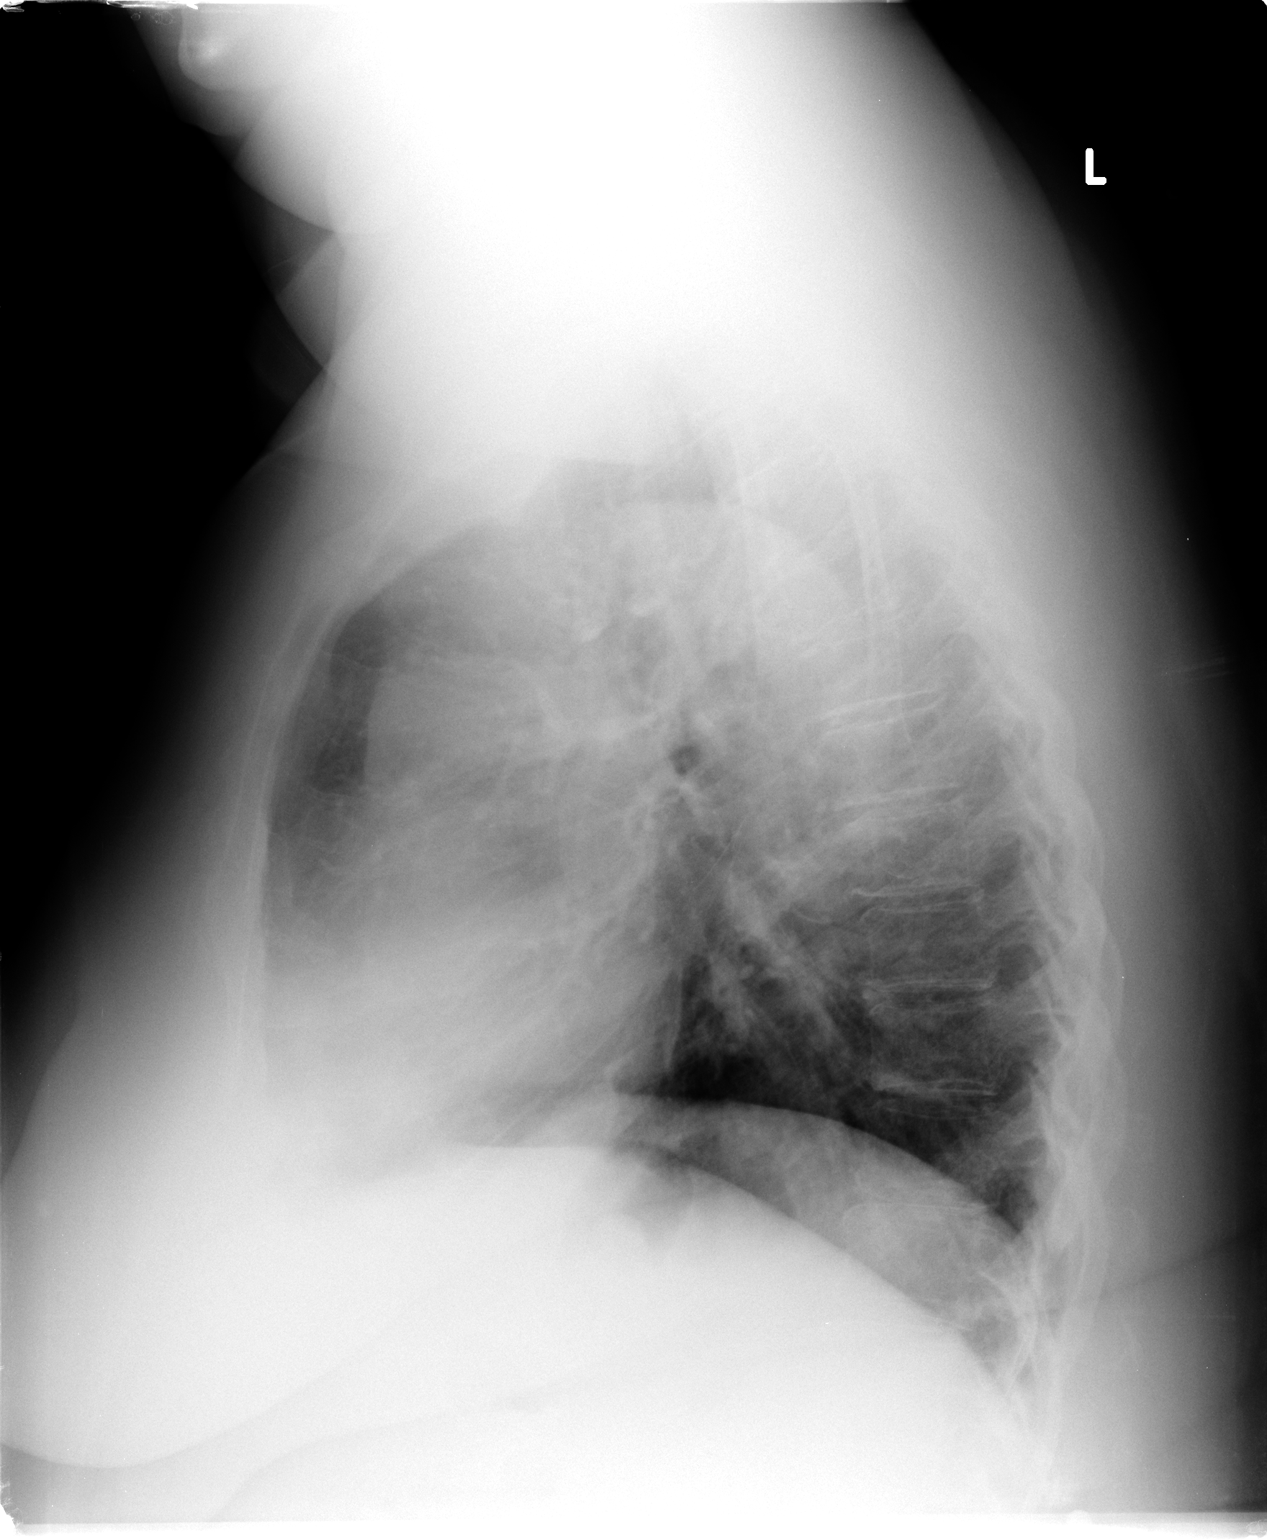

[2 of 2 positions shown; findings below may reference images not displayed]

FINDINGS: Cardiomediastinal silhouette appears normal.  No acute
pulmonary disease is noted.  Bony thorax is intact.
IMPRESSION: No acute cardiopulmonary abnormality seen.

## 2015-07-11 ENCOUNTER — Other Ambulatory Visit: Payer: Self-pay | Admitting: Internal Medicine

## 2015-07-12 ENCOUNTER — Encounter: Payer: Self-pay | Admitting: Nurse Practitioner

## 2015-07-12 ENCOUNTER — Ambulatory Visit (INDEPENDENT_AMBULATORY_CARE_PROVIDER_SITE_OTHER): Payer: Medicare Other | Admitting: Nurse Practitioner

## 2015-07-12 VITALS — BP 130/70 | HR 77 | Temp 97.8°F | Ht 60.0 in | Wt 253.0 lb

## 2015-07-12 DIAGNOSIS — J069 Acute upper respiratory infection, unspecified: Secondary | ICD-10-CM | POA: Diagnosis not present

## 2015-07-12 MED ORDER — AMOXICILLIN-POT CLAVULANATE 875-125 MG PO TABS: 1.0000 | ORAL_TABLET | Freq: Two times a day (BID) | ORAL | Status: DC

## 2015-07-12 MED ORDER — PREDNISONE 10 MG PO TABS: ORAL_TABLET | ORAL | Status: DC

## 2015-07-12 NOTE — Patient Instructions (Signed)
Upper Respiratory Infection, Adult Most upper respiratory infections (URIs) are a viral infection of the air passages leading to the lungs. A URI affects the nose, throat, and upper air passages. The most common type of URI is nasopharyngitis and is typically referred to as "the common cold." URIs run their course and usually go away on their own. Most of the time, a URI does not require medical attention, but sometimes a bacterial infection in the upper airways can follow a viral infection. This is called a secondary infection. Sinus and middle ear infections are common types of secondary upper respiratory infections. Bacterial pneumonia can also complicate a URI. A URI can worsen asthma and chronic obstructive pulmonary disease (COPD). Sometimes, these complications can require emergency medical care and may be life threatening.  CAUSES Almost all URIs are caused by viruses. A virus is a type of germ and can spread from one person to another.  RISKS FACTORS You may be at risk for a URI if:   You smoke.   You have chronic heart or lung disease.  You have a weakened defense (immune) system.   You are very young or very old.   You have nasal allergies or asthma.  You work in crowded or poorly ventilated areas.  You work in health care facilities or schools. SIGNS AND SYMPTOMS  Symptoms typically develop 2-3 days after you come in contact with a cold virus. Most viral URIs last 7-10 days. However, viral URIs from the influenza virus (flu virus) can last 14-18 days and are typically more severe. Symptoms may include:   Runny or stuffy (congested) nose.   Sneezing.   Cough.   Sore throat.   Headache.   Fatigue.   Fever.   Loss of appetite.   Pain in your forehead, behind your eyes, and over your cheekbones (sinus pain).  Muscle aches.  DIAGNOSIS  Your health care provider may diagnose a URI by:  Physical exam.  Tests to check that your symptoms are not due to  another condition such as:  Strep throat.  Sinusitis.  Pneumonia.  Asthma. TREATMENT  A URI goes away on its own with time. It cannot be cured with medicines, but medicines may be prescribed or recommended to relieve symptoms. Medicines may help:  Reduce your fever.  Reduce your cough.  Relieve nasal congestion. HOME CARE INSTRUCTIONS   Take medicines only as directed by your health care provider.   Gargle warm saltwater or take cough drops to comfort your throat as directed by your health care provider.  Use a warm mist humidifier or inhale steam from a shower to increase air moisture. This may make it easier to breathe.  Drink enough fluid to keep your urine clear or pale yellow.   Eat soups and other clear broths and maintain good nutrition.   Rest as needed.   Return to work when your temperature has returned to normal or as your health care provider advises. You may need to stay home longer to avoid infecting others. You can also use a face mask and careful hand washing to prevent spread of the virus.  Increase the usage of your inhaler if you have asthma.   Do not use any tobacco products, including cigarettes, chewing tobacco, or electronic cigarettes. If you need help quitting, ask your health care provider. PREVENTION  The best way to protect yourself from getting a cold is to practice good hygiene.   Avoid oral or hand contact with people with cold   symptoms.   Wash your hands often if contact occurs.  There is no clear evidence that vitamin C, vitamin E, echinacea, or exercise reduces the chance of developing a cold. However, it is always recommended to get plenty of rest, exercise, and practice good nutrition.  SEEK MEDICAL CARE IF:   You are getting worse rather than better.   Your symptoms are not controlled by medicine.   You have chills.  You have worsening shortness of breath.  You have brown or red mucus.  You have yellow or brown nasal  discharge.  You have pain in your face, especially when you bend forward.  You have a fever.  You have swollen neck glands.  You have pain while swallowing.  You have white areas in the back of your throat. SEEK IMMEDIATE MEDICAL CARE IF:   You have severe or persistent:  Headache.  Ear pain.  Sinus pain.  Chest pain.  You have chronic lung disease and any of the following:  Wheezing.  Prolonged cough.  Coughing up blood.  A change in your usual mucus.  You have a stiff neck.  You have changes in your:  Vision.  Hearing.  Thinking.  Mood. MAKE SURE YOU:   Understand these instructions.  Will watch your condition.  Will get help right away if you are not doing well or get worse.   This information is not intended to replace advice given to you by your health care provider. Make sure you discuss any questions you have with your health care provider.   Document Released: 10/09/2000 Document Revised: 08/30/2014 Document Reviewed: 07/21/2013 Elsevier Interactive Patient Education 2016 Elsevier Inc[employer]  

## 2015-07-12 NOTE — Progress Notes (Signed)
Pre visit review using our clinic review tool, if applicable. No additional management support is needed unless otherwise documented below in the visit note. 

## 2015-07-12 NOTE — Assessment & Plan Note (Signed)
New onset Will treat conservatively due to probable viral nature Prednisone taper to decrease inflammation given  Instructions for taking done verbally and on AVS Antibiotic prescription printed/sent to pharmacy in case of no improvement or fever within 48 hours. Advised pt to watch BS with prednisone FU prn worsening/failure to improve.
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
## 2015-08-15 ENCOUNTER — Ambulatory Visit: Payer: Medicare Other | Admitting: Internal Medicine

## 2015-08-23 ENCOUNTER — Other Ambulatory Visit (INDEPENDENT_AMBULATORY_CARE_PROVIDER_SITE_OTHER): Payer: Medicare Other

## 2015-08-23 ENCOUNTER — Telehealth: Payer: Self-pay

## 2015-08-23 ENCOUNTER — Ambulatory Visit (INDEPENDENT_AMBULATORY_CARE_PROVIDER_SITE_OTHER): Payer: Medicare Other | Admitting: Internal Medicine

## 2015-08-23 ENCOUNTER — Encounter: Payer: Self-pay | Admitting: Internal Medicine

## 2015-08-23 VITALS — BP 130/80 | HR 94 | Temp 98.7°F | Resp 20 | Wt 250.0 lb

## 2015-08-23 DIAGNOSIS — E119 Type 2 diabetes mellitus without complications: Secondary | ICD-10-CM | POA: Diagnosis not present

## 2015-08-23 DIAGNOSIS — E785 Hyperlipidemia, unspecified: Secondary | ICD-10-CM

## 2015-08-23 DIAGNOSIS — I10 Essential (primary) hypertension: Secondary | ICD-10-CM

## 2015-08-23 DIAGNOSIS — Z1239 Encounter for other screening for malignant neoplasm of breast: Secondary | ICD-10-CM

## 2015-08-23 DIAGNOSIS — J4521 Mild intermittent asthma with (acute) exacerbation: Secondary | ICD-10-CM

## 2015-08-23 DIAGNOSIS — J45901 Unspecified asthma with (acute) exacerbation: Secondary | ICD-10-CM | POA: Insufficient documentation

## 2015-08-23 DIAGNOSIS — Z1231 Encounter for screening mammogram for malignant neoplasm of breast: Secondary | ICD-10-CM

## 2015-08-23 LAB — BASIC METABOLIC PANEL
BUN: 11 mg/dL (ref 6–23)
CALCIUM: 9.7 mg/dL (ref 8.4–10.5)
CO2: 31 mEq/L (ref 19–32)
Chloride: 105 mEq/L (ref 96–112)
Creatinine, Ser: 0.76 mg/dL (ref 0.40–1.20)
GFR: 96.73 mL/min (ref >60.00)
GLUCOSE: 134 mg/dL — AB (ref 70–99)
Potassium: 3.7 mEq/L (ref 3.5–5.1)
SODIUM: 143 meq/L (ref 135–145)

## 2015-08-23 LAB — HEPATIC FUNCTION PANEL
ALBUMIN: 4.4 g/dL (ref 3.5–5.2)
ALK PHOS: 60 U/L (ref 39–117)
ALT: 29 U/L (ref 0–35)
AST: 25 U/L (ref 0–37)
Bilirubin, Direct: 0.2 mg/dL (ref 0.0–0.3)
TOTAL PROTEIN: 7.6 g/dL (ref 6.0–8.3)
Total Bilirubin: 0.9 mg/dL (ref 0.2–1.2)

## 2015-08-23 LAB — LIPID PANEL
CHOL/HDL RATIO: 2
CHOLESTEROL: 158 mg/dL (ref 0–200)
HDL: 66.1 mg/dL (ref >39.00)
LDL Cholesterol: 74 mg/dL (ref 0–99)
NonHDL: 92.18
TRIGLYCERIDES: 91 mg/dL (ref 0.0–149.0)
VLDL: 18.2 mg/dL (ref 0.0–40.0)

## 2015-08-23 LAB — HEMOGLOBIN A1C: HEMOGLOBIN A1C: 6.9 % — AB (ref 4.6–6.5)

## 2015-08-23 MED ORDER — PREDNISONE 10 MG PO TABS: ORAL_TABLET | ORAL | Status: DC

## 2015-08-23 NOTE — Assessment & Plan Note (Signed)
stable overall by history and exam, recent data reviewed with pt, and pt to continue medical treatment as before,  to f/u any worsening symptoms or concerns BP Readings from Last 3 Encounters:  08/23/15 130/80  07/12/15 130/70  03/28/15 154/82

## 2015-08-23 NOTE — Assessment & Plan Note (Signed)
Mild persistent, with seasonal worsening allergy related most likely, for depomedrol IM, predpac asd, cont advair and albut MDI prn as well,  to f/u any worsening symptoms or concerns

## 2015-08-23 NOTE — Telephone Encounter (Signed)
Please advise could you re order the mammogram, lab called and accidentally deleted it.

## 2015-08-23 NOTE — Assessment & Plan Note (Signed)
stable overall by history and exam, recent data reviewed with pt, and pt to continue medical treatment as before,  to f/u any worsening symptoms or concerns Lab Results  Component Value Date   HGBA1C 6.3 02/15/2015

## 2015-08-23 NOTE — Patient Instructions (Signed)
You had the steroid shot today  Please take all new medication as prescribed  - the prednisone  Please continue all other medications as before, and refills have been done if requested.  Please have the pharmacy call with any other refills you may need.  Please continue your efforts at being more active, low cholesterol diet, and weight control.  Please keep your appointments with your specialists as you may have planned  You will be contacted regarding the referral for: mammogram  Please go to the LAB in the Basement (turn left off the elevator) for the tests to be done today  You will be contacted by phone if any changes need to be made immediately.  Otherwise, you will receive a letter about your results with an explanation, but please check with MyChart first.  Please remember to sign up for MyChart if you have not done so, as this will be important to you in the future with finding out test results, communicating by private email, and scheduling acute appointments online when needed.  Please return in 6 months, or sooner if needed

## 2015-08-23 NOTE — Progress Notes (Signed)
Pre visit review using our clinic review tool, if applicable. No additional management support is needed unless otherwise documented below in the visit note. 

## 2015-08-23 NOTE — Assessment & Plan Note (Signed)
stable overall by history and exam, recent data reviewed with pt, and pt to continue medical treatment as before,  to f/u any worsening symptoms or concerns Lab Results  Component Value Date   LDLCALC 66 02/15/2015

## 2015-08-23 NOTE — Addendum Note (Signed)
Addended by: Biagio Borg on: 08/23/2015 12:47 PM   Modules accepted: Orders
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
## 2015-08-25 ENCOUNTER — Other Ambulatory Visit: Payer: Self-pay

## 2015-08-25 DIAGNOSIS — Z1231 Encounter for screening mammogram for malignant neoplasm of breast: Secondary | ICD-10-CM

## 2015-09-01 ENCOUNTER — Ambulatory Visit
Admission: RE | Admit: 2015-09-01 | Discharge: 2015-09-01 | Disposition: A | Discharge status: Home / Self Care | Payer: Medicare Other | Source: Ambulatory Visit

## 2015-09-01 DIAGNOSIS — Z1231 Encounter for screening mammogram for malignant neoplasm of breast: Secondary | ICD-10-CM

## 2015-09-04 ENCOUNTER — Other Ambulatory Visit: Payer: Self-pay | Admitting: Internal Medicine

## 2015-09-04 DIAGNOSIS — R928 Other abnormal and inconclusive findings on diagnostic imaging of breast: Secondary | ICD-10-CM

## 2015-09-07 DIAGNOSIS — M5137 Other intervertebral disc degeneration, lumbosacral region: Secondary | ICD-10-CM | POA: Diagnosis not present

## 2015-09-12 ENCOUNTER — Other Ambulatory Visit: Payer: Self-pay | Admitting: Internal Medicine

## 2015-09-12 ENCOUNTER — Ambulatory Visit
Admission: RE | Admit: 2015-09-12 | Discharge: 2015-09-12 | Disposition: A | Discharge status: Home / Self Care | Payer: Medicare Other | Source: Ambulatory Visit | Attending: Internal Medicine | Admitting: Internal Medicine

## 2015-09-12 DIAGNOSIS — R928 Other abnormal and inconclusive findings on diagnostic imaging of breast: Secondary | ICD-10-CM

## 2015-09-12 DIAGNOSIS — N63 Unspecified lump in breast: Secondary | ICD-10-CM | POA: Diagnosis not present

## 2015-09-12 DIAGNOSIS — R921 Mammographic calcification found on diagnostic imaging of breast: Secondary | ICD-10-CM | POA: Diagnosis not present

## 2015-09-18 ENCOUNTER — Ambulatory Visit
Admission: RE | Admit: 2015-09-18 | Discharge: 2015-09-18 | Disposition: A | Discharge status: Home / Self Care | Payer: Medicare Other | Source: Ambulatory Visit | Attending: Internal Medicine | Admitting: Internal Medicine

## 2015-09-18 ENCOUNTER — Other Ambulatory Visit: Payer: Self-pay | Admitting: Internal Medicine

## 2015-09-18 DIAGNOSIS — N63 Unspecified lump in breast: Secondary | ICD-10-CM | POA: Diagnosis not present

## 2015-09-18 DIAGNOSIS — R928 Other abnormal and inconclusive findings on diagnostic imaging of breast: Secondary | ICD-10-CM

## 2015-09-18 DIAGNOSIS — C50811 Malignant neoplasm of overlapping sites of right female breast: Secondary | ICD-10-CM | POA: Diagnosis not present

## 2015-09-18 HISTORY — PX: BREAST BIOPSY: SHX20

## 2015-09-20 ENCOUNTER — Telehealth: Payer: Self-pay | Admitting: *Deleted

## 2015-09-20 ENCOUNTER — Other Ambulatory Visit: Payer: Self-pay | Admitting: Internal Medicine

## 2015-09-20 DIAGNOSIS — M545 Low back pain: Secondary | ICD-10-CM | POA: Diagnosis not present

## 2015-09-20 DIAGNOSIS — M6281 Muscle weakness (generalized): Secondary | ICD-10-CM | POA: Diagnosis not present

## 2015-09-20 DIAGNOSIS — M79604 Pain in right leg: Secondary | ICD-10-CM | POA: Diagnosis not present

## 2015-09-20 DIAGNOSIS — C50411 Malignant neoplasm of upper-outer quadrant of right female breast: Secondary | ICD-10-CM | POA: Insufficient documentation

## 2015-09-20 DIAGNOSIS — M79605 Pain in left leg: Secondary | ICD-10-CM | POA: Diagnosis not present

## 2015-09-20 NOTE — Telephone Encounter (Signed)
Confirmed [facility] for 09/27/15 at 815am .  Instructions and contact information given.

## 2015-09-21 DIAGNOSIS — M79604 Pain in right leg: Secondary | ICD-10-CM | POA: Diagnosis not present

## 2015-09-21 DIAGNOSIS — M79605 Pain in left leg: Secondary | ICD-10-CM | POA: Diagnosis not present

## 2015-09-21 DIAGNOSIS — M545 Low back pain: Secondary | ICD-10-CM | POA: Diagnosis not present

## 2015-09-21 DIAGNOSIS — M6281 Muscle weakness (generalized): Secondary | ICD-10-CM | POA: Diagnosis not present

## 2015-09-26 DIAGNOSIS — M79605 Pain in left leg: Secondary | ICD-10-CM | POA: Diagnosis not present

## 2015-09-26 DIAGNOSIS — M6281 Muscle weakness (generalized): Secondary | ICD-10-CM | POA: Diagnosis not present

## 2015-09-26 DIAGNOSIS — M545 Low back pain: Secondary | ICD-10-CM | POA: Diagnosis not present

## 2015-09-26 DIAGNOSIS — M79604 Pain in right leg: Secondary | ICD-10-CM | POA: Diagnosis not present

## 2015-09-27 ENCOUNTER — Encounter: Payer: Self-pay | Admitting: Nurse Practitioner

## 2015-09-27 ENCOUNTER — Other Ambulatory Visit: Payer: Self-pay | Admitting: *Deleted

## 2015-09-27 ENCOUNTER — Encounter: Payer: Self-pay | Admitting: Skilled Nursing Facility1

## 2015-09-27 ENCOUNTER — Ambulatory Visit: Payer: Medicare Other | Attending: General Surgery | Admitting: Physical Therapy

## 2015-09-27 ENCOUNTER — Other Ambulatory Visit: Payer: Self-pay | Admitting: General Surgery

## 2015-09-27 ENCOUNTER — Ambulatory Visit
Admission: RE | Admit: 2015-09-27 | Discharge: 2015-09-27 | Disposition: A | Discharge status: Home / Self Care | Payer: Medicare Other | Source: Ambulatory Visit | Attending: Radiation Oncology | Admitting: Radiation Oncology

## 2015-09-27 ENCOUNTER — Ambulatory Visit (HOSPITAL_BASED_OUTPATIENT_CLINIC_OR_DEPARTMENT_OTHER): Payer: Medicare Other | Admitting: Hematology and Oncology

## 2015-09-27 ENCOUNTER — Other Ambulatory Visit (HOSPITAL_BASED_OUTPATIENT_CLINIC_OR_DEPARTMENT_OTHER): Payer: Medicare Other

## 2015-09-27 ENCOUNTER — Encounter: Payer: Self-pay | Admitting: Hematology and Oncology

## 2015-09-27 ENCOUNTER — Encounter: Payer: Self-pay | Admitting: *Deleted

## 2015-09-27 ENCOUNTER — Encounter: Payer: Self-pay | Admitting: Physical Therapy

## 2015-09-27 VITALS — BP 180/82 | HR 74 | Temp 98.1°F | Resp 20 | Ht 60.0 in | Wt 248.0 lb

## 2015-09-27 DIAGNOSIS — C50411 Malignant neoplasm of upper-outer quadrant of right female breast: Secondary | ICD-10-CM

## 2015-09-27 DIAGNOSIS — Z170 Estrogen receptor positive status [ER+]: Secondary | ICD-10-CM

## 2015-09-27 DIAGNOSIS — C50911 Malignant neoplasm of unspecified site of right female breast: Secondary | ICD-10-CM | POA: Diagnosis not present

## 2015-09-27 DIAGNOSIS — R293 Abnormal posture: Secondary | ICD-10-CM | POA: Insufficient documentation

## 2015-09-27 DIAGNOSIS — Z510 Encounter for antineoplastic radiation therapy: Secondary | ICD-10-CM | POA: Insufficient documentation

## 2015-09-27 LAB — CBC WITH DIFFERENTIAL/PLATELET
BASO%: 0.7 % (ref 0.0–2.0)
BASOS ABS: 0.1 10*3/uL (ref 0.0–0.1)
EOS%: 3.1 % (ref 0.0–7.0)
Eosinophils Absolute: 0.3 10*3/uL (ref 0.0–0.5)
HEMATOCRIT: 42.7 % (ref 34.8–46.6)
HGB: 13.8 g/dL (ref 11.6–15.9)
LYMPH%: 35.3 % (ref 14.0–49.7)
MCH: 26.8 pg (ref 25.1–34.0)
MCHC: 32.3 g/dL (ref 31.5–36.0)
MCV: 82.9 fL (ref 79.5–101.0)
MONO#: 0.6 10*3/uL (ref 0.1–0.9)
MONO%: 5.8 % (ref 0.0–14.0)
NEUT#: 5.4 10*3/uL (ref 1.5–6.5)
NEUT%: 55.1 % (ref 38.4–76.8)
Platelets: 217 10*3/uL (ref 145–400)
RBC: 5.15 10*6/uL (ref 3.70–5.45)
RDW: 15.3 % — ABNORMAL HIGH (ref 11.2–14.5)
WBC: 9.8 10*3/uL (ref 3.9–10.3)
lymph#: 3.5 10*3/uL — ABNORMAL HIGH (ref 0.9–3.3)

## 2015-09-27 LAB — COMPREHENSIVE METABOLIC PANEL
ALT: 28 U/L (ref 0–55)
AST: 22 U/L (ref 5–34)
Albumin: 3.9 g/dL (ref 3.5–5.0)
Alkaline Phosphatase: 61 U/L (ref 40–150)
Anion Gap: 11 mEq/L (ref 3–11)
BUN: 8.8 mg/dL (ref 7.0–26.0)
CALCIUM: 9.2 mg/dL (ref 8.4–10.4)
CHLORIDE: 107 meq/L (ref 98–109)
CO2: 27 mEq/L (ref 22–29)
CREATININE: 0.8 mg/dL (ref 0.6–1.1)
EGFR: 88 mL/min/{1.73_m2} — ABNORMAL LOW (ref >90)
GLUCOSE: 136 mg/dL (ref 70–140)
POTASSIUM: 3.7 meq/L (ref 3.5–5.1)
SODIUM: 145 meq/L (ref 136–145)
Total Bilirubin: 0.65 mg/dL (ref 0.20–1.20)
Total Protein: 7.5 g/dL (ref 6.4–8.3)

## 2015-09-27 NOTE — Progress Notes (Signed)
Radiation Oncology         [phone]) [phone] ________________________________  Initial Outpatient Consultation - Date: 09/27/2015   Name: Denise [name] MRN: [id number]   DOB: [birth year]  REFERRING PHYSICIAN: Excell Seltzer, MD  DIAGNOSIS AND STAGE: No matching staging information was found for the patient.  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  HISTORY OF PRESENT ILLNESS::Denise [name] is a 70 y.o. female who presents today after an abnormal routine mammogram. She had a diagnostic mammogram and ultrasound 09/12/2015, revealing a suspicious right breast distortion and intermediate calcifications in the outer right breast located approximately 2.6 cm posterior to the suspicious distortion. 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 She will need a biopsy of the 3 mm area of calcifications adjacent to the mass in the right breast. She is here today to discuss radiation treatment options.  She denies pain or bleeding after the biopsy. She is working as a Emergency planning/management officer. She is accompanied by her sister and daughter and she had her other daughter on the phone.  PREVIOUS RADIATION THERAPY: No  Past medical, social and family history were reviewed in the electronic chart. Review of symptoms was reviewed in the electronic chart. Medications were reviewed in the electronic chart.   PHYSICAL EXAM: There were no vitals filed for this visit.. .  No palpable cervical, supraclavicular, or axillary adenopathy. She has a biopsy change in the lower outer quadrant of the right breast. No palpable abnormalities of the left breast.  Vitals with BMI 09/27/2015  Height _0   Weight 248 lbs  BMI 87.5  Systolic 643  Diastolic 82  Pulse 74  Respirations 20   Gynecologic History  Age at first menstrual period? 10  Are you still having periods? No Approximate date of  last period? 48 years  If you are still having periods: Are your periods regular? No  If you no longer have periods: Have you used hormone replacement? No Obstetric History:  How many children have you carried to term? 2 Your age at first live birth? 87  Pregnant now or trying to get pregnant? No  Have you used birth control pills or hormone shots for contraception? Yes  If so, for how long (or approximate dates)? [id number] Health Maintenance:  Have you ever had a colonoscopy? Yes  Have you ever had a bone density? 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  PLAN: I spoke to the patient today regarding her diagnosis and options for treatment. We discussed the equivalence in terms of survival and local failure between mastectomy and breast conservation. We discussed the role of radiation in decreasing local failures in patients who undergo lumpectomy. We discussed 4 weeks of treatment as an outpatient. We discussed the low likelihood of secondary malignancies. We discussed the possible side effects including but not limited to skin redness, fatigue, permanent skin darkening, and breast swelling. We discussed that, due to her age, she can choose to not receive radiation and just receive hormone therapy after the lumpectomy. We discussed that hormone therapy would include taking a pill every day for five years.  She will get another biopsy of the calcifications adjacent to the right breast mass. She will have a  lumpectomy scheduled. I will follow up with her a month after surgery. She will decide if she would like radiation or hormone therapy after surgery.    I spent 40 minutes  face to face with the patient and more than 50% of that time was spent in counseling and/or coordination of care.     ------------------------------------------------  Thea Silversmith, MD    This document serves as a record of services personally performed by Thea Silversmith, MD. It was created on her behalf by  Lendon Collar, a trained medical scribe. The creation of this record is based on the scribe's personal observations and the provider's statements to them. This document has been checked and approved by the attending provider.

## 2015-09-27 NOTE — Progress Notes (Signed)
Subjective:     Patient ID: Denise [name], female   DOB: 05/14/1945, 70 y.o.   MRN: [id number]  HPI   Review of Systems     Objective:   Physical Exam For the patient to understand and be given the tools to implement a healthy plant based diet during their cancer diagnosis.     Assessment:     Patient was seen today and found to be pleasant and accompanied by her friends. Pt does have diabetes and states her A1C is 6.5. Pt states she cannot eat beans due to her diabetes. Pts medications: metformin, glimperide, multivitamin, omega 3, potassium, lasix, atorvastatin. Pt states she has no stress and knows everything happens for a reason.     Plan:     Dietitian educated the patient on implementing a plant based diet by incorporating more plant proteins, fruits, and vegetables. As a part of a healthy routine physical activity was discussed. Dietitian briefly educated the pt on carbohydrate counting and that she can eat beans. A folder of evidence based information with a focus on a plant based diet and general nutrition during cancer was given to the patient.  The importance of legitimate, evidence based information was discussed and examples were given. As a part of the continuum of care the cancer dietitian's contact information was given to the patient in the event they would like to have a follow up appointment.

## 2015-09-27 NOTE — Progress Notes (Signed)
Clinical Social Work [place] Psychosocial Distress Screening [place]  Patient completed distress screening protocol and scored a 2 on the Psychosocial Distress Thermometer which indicates mild distress. Clinical Social Worker met with patient and patients family in [facility] to assess for distress and other psychosocial needs. Patient stated she was feeling overwhelmed but felt "better" after meeting with the treatment team and getting more information on her treatment plan. CSW and patient discussed common feeling and emotions when being diagnosed with cancer, and the importance of support during treatment. CSW informed patient of the support team and support services at [facility] and patient was agreeable to an [employer]. CSW provided contact information and encouraged patient to call with any questions or concerns.   ONCBCN DISTRESS SCREENING 09/27/2015  Screening Type Initial Screening  Distress experienced in past week (1-10) 2  Information Concerns Type Lack of info about diagnosis;Lack of info about treatment;Lack of info about complementary therapy choices;Lack of info about maintaining fitness  Physical Problem type Getting around  Physician notified of physical symptoms Yes  Referral to clinical psychology No  Referral to clinical social work Yes  Referral to dietition No  Referral to financial advocate No  Referral to support programs Yes  Referral to palliative care No   Johnnye Lana, MSW, LCSW, OSW-C Clinical Social Worker [place] [phone]

## 2015-09-27 NOTE — Progress Notes (Signed)
[name] is a very pleasant 70 y.o. 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  Biopsy results revealed the tumor's prognostic profile is ER positive, PR positive, and HER2/neu negative. Ki67 is 10%.  She presents today with her sister and friend to the [facility]) for treatment consideration and recommendations from the breast surgeon, radiation oncologist, and medical oncologist.     I briefly met with [name] and her family during her [facility] visit today. We discussed the purpose of the Survivorship Clinic, which will include monitoring for recurrence, coordinating completion of age and gender-appropriate cancer screenings, promotion of overall wellness, as well as managing potential late/long-term side effects of anti-cancer treatments.    The treatment plan for [name] will likely include surgery, +/- chemotherapy (pending oncotype), radiation therapy, and anti-estrogen therapy.  As of today, the intent of treatment for [name] is cure, therefore she will be eligible for the Survivorship Clinic upon her completion of treatment.  Her survivorship care plan (SCP) document will be drafted and updated throughout the course of her treatment trajectory. She will receive the SCP in an office visit with myself in the Survivorship Clinic once she has completed treatment.   [name] was encouraged to ask questions and all questions were answered to her satisfaction.  She was given my business card and encouraged to contact me with any concerns regarding survivorship.  I look forward to participating in her care.   Kenn File, [place] [phone]

## 2015-09-27 NOTE — Patient Instructions (Signed)

## 2015-09-27 NOTE — Assessment & Plan Note (Signed)
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 After reviewing the pathology in detail, we proceeded to discuss the different treatment options between surgery, radiation, chemotherapy, antiestrogen therapies.  Recommendations: Biopsy of the additional lesion by stereotactic approach 1. Breast conserving surgery followed by 2. Oncotype DX testing to determine if chemotherapy would be of any benefit followed by 3. Adjuvant radiation therapy followed by 4. Adjuvant antiestrogen therapy  Oncotype counseling: I discussed Oncotype DX test. I explained to the patient that this is a 21 gene panel to evaluate patient tumors DNA to calculate recurrence score. This would help determine whether patient has high risk or intermediate risk or low risk breast cancer. She understands that if her tumor was found to be high risk, she would benefit from systemic chemotherapy. If low risk, no need of chemotherapy. If she was found to be intermediate risk, we would need to evaluate the score as well as other risk factors and determine if an abbreviated chemotherapy may be of benefit.  Return to clinic after surgery to discuss final pathology report and then determine if Oncotype DX testing will need to be sent.
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
## 2015-09-28 ENCOUNTER — Other Ambulatory Visit: Payer: Self-pay | Admitting: Internal Medicine

## 2015-09-29 ENCOUNTER — Ambulatory Visit
Admission: RE | Admit: 2015-09-29 | Discharge: 2015-09-29 | Disposition: A | Discharge status: Home / Self Care | Payer: Medicare Other | Source: Ambulatory Visit | Attending: General Surgery | Admitting: General Surgery

## 2015-09-29 DIAGNOSIS — D241 Benign neoplasm of right breast: Secondary | ICD-10-CM | POA: Diagnosis not present

## 2015-09-29 DIAGNOSIS — M79604 Pain in right leg: Secondary | ICD-10-CM | POA: Diagnosis not present

## 2015-09-29 DIAGNOSIS — C50411 Malignant neoplasm of upper-outer quadrant of right female breast: Secondary | ICD-10-CM

## 2015-09-29 DIAGNOSIS — M545 Low back pain: Secondary | ICD-10-CM | POA: Diagnosis not present

## 2015-09-29 DIAGNOSIS — M79605 Pain in left leg: Secondary | ICD-10-CM | POA: Diagnosis not present

## 2015-09-29 DIAGNOSIS — M6281 Muscle weakness (generalized): Secondary | ICD-10-CM | POA: Diagnosis not present

## 2015-09-29 DIAGNOSIS — R921 Mammographic calcification found on diagnostic imaging of breast: Secondary | ICD-10-CM | POA: Diagnosis not present

## 2015-09-29 HISTORY — PX: BREAST BIOPSY: SHX20

## 2015-10-02 DIAGNOSIS — M79605 Pain in left leg: Secondary | ICD-10-CM | POA: Diagnosis not present

## 2015-10-02 DIAGNOSIS — M545 Low back pain: Secondary | ICD-10-CM | POA: Diagnosis not present

## 2015-10-02 DIAGNOSIS — M79604 Pain in right leg: Secondary | ICD-10-CM | POA: Diagnosis not present

## 2015-10-02 DIAGNOSIS — M6281 Muscle weakness (generalized): Secondary | ICD-10-CM | POA: Diagnosis not present

## 2015-10-03 ENCOUNTER — Telehealth: Payer: Self-pay | Admitting: *Deleted

## 2015-10-03 NOTE — Telephone Encounter (Signed)
  Oncology Nurse Navigator Documentation    Navigator Encounter Type: Telephone;MDC Follow-up (10/03/15 1300) Telephone: Lahoma Crocker Call;Clinic/MDC Follow-up (10/03/15 1300)             Barriers/Navigation Needs: No barriers at this time;No Questions;No Needs (10/03/15 1300)   Interventions: None required (10/03/15 1300)                      Time Spent with Patient: 15 (10/03/15 1300)

## 2015-10-04 ENCOUNTER — Other Ambulatory Visit: Payer: Self-pay | Admitting: General Surgery

## 2015-10-04 DIAGNOSIS — M79604 Pain in right leg: Secondary | ICD-10-CM | POA: Diagnosis not present

## 2015-10-04 DIAGNOSIS — M545 Low back pain: Secondary | ICD-10-CM | POA: Diagnosis not present

## 2015-10-04 DIAGNOSIS — C50911 Malignant neoplasm of unspecified site of right female breast: Secondary | ICD-10-CM

## 2015-10-04 DIAGNOSIS — M6281 Muscle weakness (generalized): Secondary | ICD-10-CM | POA: Diagnosis not present

## 2015-10-04 DIAGNOSIS — M79605 Pain in left leg: Secondary | ICD-10-CM | POA: Diagnosis not present

## 2015-10-05 ENCOUNTER — Telehealth: Payer: Self-pay | Admitting: Hematology and Oncology

## 2015-10-05 ENCOUNTER — Encounter: Payer: Self-pay | Admitting: *Deleted

## 2015-10-05 NOTE — Telephone Encounter (Signed)
lvm to inform patient of 6/27 appt at 1145 per 6/8 pof

## 2015-10-06 DIAGNOSIS — M6281 Muscle weakness (generalized): Secondary | ICD-10-CM | POA: Diagnosis not present

## 2015-10-06 DIAGNOSIS — M79605 Pain in left leg: Secondary | ICD-10-CM | POA: Diagnosis not present

## 2015-10-06 DIAGNOSIS — M79604 Pain in right leg: Secondary | ICD-10-CM | POA: Diagnosis not present

## 2015-10-06 DIAGNOSIS — M545 Low back pain: Secondary | ICD-10-CM | POA: Diagnosis not present

## 2015-10-09 DIAGNOSIS — M6281 Muscle weakness (generalized): Secondary | ICD-10-CM | POA: Diagnosis not present

## 2015-10-09 DIAGNOSIS — M79604 Pain in right leg: Secondary | ICD-10-CM | POA: Diagnosis not present

## 2015-10-09 DIAGNOSIS — M79605 Pain in left leg: Secondary | ICD-10-CM | POA: Diagnosis not present

## 2015-10-09 DIAGNOSIS — M545 Low back pain: Secondary | ICD-10-CM | POA: Diagnosis not present

## 2015-10-10 ENCOUNTER — Encounter (HOSPITAL_BASED_OUTPATIENT_CLINIC_OR_DEPARTMENT_OTHER): Payer: Self-pay | Admitting: *Deleted

## 2015-10-11 DIAGNOSIS — M6281 Muscle weakness (generalized): Secondary | ICD-10-CM | POA: Diagnosis not present

## 2015-10-11 DIAGNOSIS — M79605 Pain in left leg: Secondary | ICD-10-CM | POA: Diagnosis not present

## 2015-10-11 DIAGNOSIS — M79604 Pain in right leg: Secondary | ICD-10-CM | POA: Diagnosis not present

## 2015-10-11 DIAGNOSIS — M545 Low back pain: Secondary | ICD-10-CM | POA: Diagnosis not present

## 2015-10-16 ENCOUNTER — Encounter (HOSPITAL_BASED_OUTPATIENT_CLINIC_OR_DEPARTMENT_OTHER)
Admission: RE | Admit: 2015-10-16 | Discharge: 2015-10-16 | Disposition: A | Discharge status: Home / Self Care | Payer: Medicare Other | Source: Ambulatory Visit | Attending: General Surgery | Admitting: General Surgery

## 2015-10-16 ENCOUNTER — Ambulatory Visit
Admission: RE | Admit: 2015-10-16 | Discharge: 2015-10-16 | Disposition: A | Discharge status: Home / Self Care | Payer: Medicare Other | Source: Ambulatory Visit | Attending: General Surgery | Admitting: General Surgery

## 2015-10-16 ENCOUNTER — Other Ambulatory Visit: Payer: Self-pay

## 2015-10-16 DIAGNOSIS — E119 Type 2 diabetes mellitus without complications: Secondary | ICD-10-CM | POA: Diagnosis not present

## 2015-10-16 DIAGNOSIS — C50911 Malignant neoplasm of unspecified site of right female breast: Secondary | ICD-10-CM

## 2015-10-16 DIAGNOSIS — Z510 Encounter for antineoplastic radiation therapy: Secondary | ICD-10-CM | POA: Diagnosis not present

## 2015-10-16 DIAGNOSIS — C50411 Malignant neoplasm of upper-outer quadrant of right female breast: Secondary | ICD-10-CM | POA: Diagnosis not present

## 2015-10-16 DIAGNOSIS — I10 Essential (primary) hypertension: Secondary | ICD-10-CM | POA: Diagnosis not present

## 2015-10-16 DIAGNOSIS — D0511 Intraductal carcinoma in situ of right breast: Secondary | ICD-10-CM | POA: Diagnosis not present

## 2015-10-16 DIAGNOSIS — Z87891 Personal history of nicotine dependence: Secondary | ICD-10-CM | POA: Diagnosis not present

## 2015-10-16 NOTE — Progress Notes (Signed)
ekg reviewed by Dr Conrad [place] no further testing needed

## 2015-10-16 NOTE — Progress Notes (Signed)
Anesthesia consult by Dr Conrad [place] , clear for surgery at [facility].  Pt was given 8 oz bottle of water with verbal and written instructions to drink morning of surgery by 0400. Also instructed not to drink any other fluid after midnight. Teach back used and pt voiced understanding,

## 2015-10-16 NOTE — H&P (Signed)
History of Present Illness Denise [name] T.  MD; 09/27/2015 12:41 PM) The patient is a 70 year old female who presents with breast cancer. She is a 70 year old postmenopausal female referred by Dr. 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 She recently presented for a screening mamogram revealing new right breast distortion. Subsequent imaging included diagnostic mamogram showing persistent area of distortion in the outer right breast containing faint calcifications and a separate 3 mm group of calcifications noted 2.6 cm posterior to the area of distortion and ultrasound showing an irregular hypoechoic mass at 9 cm from the nipple measuring 1 cm in greatest diameter corresponding to the area of distortion. 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 She is seen now in breast multidisciplinary clinic for initial treatment planning. She has experienced no breast symptoms, specifically lump or nipple discharge, skin changes or pain. She does not have a personal history of a previous benign left breast biopsy.  Findings at that time were the following: Tumor size: 1 cm Tumor grade: 2, Ki-67 10% Estrogen Receptor: 100% positive Progesterone Receptor: 90% positive Her-2 neu: negative Lymph node status: negative    Other Problems Conni Slipper, RN; 09/27/2015 7:57 AM) Arthritis Asthma Depression Diabetes Mellitus Gastroesophageal Reflux Disease Heart murmur High blood pressure Hypercholesterolemia Lump In Breast  Past Surgical History Conni Slipper, RN; 09/27/2015 7:57 AM) Breast Biopsy Right. Cataract Surgery Bilateral. Knee Surgery Bilateral. Thyroid Surgery  Diagnostic Studies History Conni Slipper, RN; 09/27/2015 7:57 AM) Colonoscopy 1-5 years  ago Mammogram within last year Pap Smear 1-5 years ago  Medication History Conni Slipper, RN; 09/27/2015 7:56 AM) Advair Diskus (100-50MCG/DOSE Aero Pow Br Act, Inhalation) Active. AmLODIPine Besylate (5MG Tablet, Oral) Active. Atorvastatin Calcium (40MG Tablet, Oral) Active. Furosemide (40MG Tablet, Oral) Active. Glimepiride (2MG Tablet, Oral) Active. Levalbuterol Tartrate (45MCG/ACT Aerosol, Inhalation) Active. Lisinopril (40MG Tablet, Oral) Active. MetFORMIN HCl ER (500MG Tablet ER 24HR, Oral) Active. Potassium Chloride ER (10MEQ Capsule ER, Oral) Active. Aspirin (81MG Tablet DR, Oral) Active. Multiple Vitamin (1 (one) Oral) Active. Omega 3 (1000MG Capsule, Oral) Active. ZyrTEC Allergy (10MG Tablet, Oral) Active. Fluticasone-Salmeterol (100-50MCG/DOSE Aero Pow Br Act, Inhalation) Active. Amaryl (2MG Tablet, Oral) Active. Medications Reconciled  Social History Conni Slipper, RN; 09/27/2015 7:57 AM) Alcohol use Moderate alcohol use. Caffeine use Carbonated beverages. No drug use Tobacco use Former smoker.  Family History Conni Slipper, RN; 09/27/2015 7:57 AM) Alcohol Abuse Brother. Arthritis Brother, Father, Mother, Sister, Son. Diabetes Mellitus Brother, Family Members In General. Hypertension Family Members In General.  Pregnancy / Birth History Conni Slipper, RN; 09/27/2015 7:57 AM) Age at menarche 30 years. Age of menopause 50-50 Contraceptive History Intrauterine device, Oral contraceptives. Gravida 4 Maternal age 74-20 Para 2    Review of Systems Conni Slipper RN; 09/27/2015 7:57 AM) General Not Present- Appetite Loss, Chills, Fatigue, Fever, Night Sweats, Weight Gain and Weight Loss. Skin Not Present- Change in Wart/Mole, Dryness, Hives, Jaundice, New Lesions, Non-Healing Wounds, Rash and Ulcer. HEENT Present- Hoarseness and Seasonal Allergies. Not Present- Earache, Hearing Loss, Nose Bleed, Oral Ulcers, Ringing in the Ears, Sinus Pain,  Sore Throat, Visual Disturbances, Wears glasses/contact lenses and Yellow Eyes. Respiratory Present- Wheezing. Not Present- Bloody sputum, Chronic Cough, Difficulty Breathing and Snoring. Breast Not Present- Breast Mass, Breast Pain, Nipple Discharge and Skin Changes. Cardiovascular Not Present- Chest Pain, Difficulty  Breathing Lying Down, Leg Cramps, Palpitations, Rapid Heart Rate, Shortness of Breath and Swelling of Extremities. Gastrointestinal Not Present- Abdominal Pain, Bloating, Bloody Stool, Change in Bowel Habits, Chronic diarrhea, Constipation, Difficulty Swallowing, Excessive gas, Gets full quickly at meals, Hemorrhoids, Indigestion, Nausea, Rectal Pain and Vomiting. Female Genitourinary Not Present- Frequency, Nocturia, Painful Urination, Pelvic Pain and Urgency. Musculoskeletal Present- Joint Stiffness, Muscle Pain and Muscle Weakness. Not Present- Back Pain, Joint Pain and Swelling of Extremities. Neurological Present- Trouble walking and Weakness. Not Present- Decreased Memory, Fainting, Headaches, Numbness, Seizures, Tingling and Tremor. Psychiatric Present- Depression. Not Present- Anxiety, Bipolar, Change in Sleep Pattern, Fearful and Frequent crying. Endocrine Present- Hot flashes. Not Present- Cold Intolerance, Excessive Hunger, Hair Changes, Heat Intolerance and New Diabetes. Hematology Not Present- Easy Bruising, Excessive bleeding, Gland problems, HIV and Persistent Infections.   Physical Exam Denise [name] T. Johngabriel Verde MD; 09/27/2015 12:43 PM) The physical exam findings are as follows: Note:General: Alert, obese African-American female, in no distress Skin: Warm and dry without rash or infection. HEENT: No palpable masses or thyromegaly. healed thyroidectomy incision Sclera nonicteric. Pupils equal round and reactive. Oropharynx clear. Lymph nodes: No cervical, supraclavicular, nodes palpable. Breasts: there is some thickening or subtle mass in the lateral right breast, possibly  post biopsy change. No other palpable abnormalities in either breast. No palpable axillary adenopathy. Lungs: Breath sounds clear and equal. No wheezing or increased work of breathing. Cardiovascular: Regular rate and rhythm. 2/6 systolic murmur. No JVD, 1+ ankle edema. Abdomen: Nondistended. Soft and nontender. No masses palpable. No organomegaly. No palpable hernias. Extremities: 1+ edema or joint swelling or deformity. No chronic venous stasis changes. Neurologic: Alert and fully oriented. Gait normal. No focal weakness. Psychiatric: Normal mood and affect. Thought content appropriate with normal judgement and insight    Assessment & Plan Denise [name] T. Chandani Rogowski MD; 09/27/2015 12:47 PM) STAGE I BREAST CANCER, RIGHT (C50.911) Impression: 70 year old female with a new diagnosis of cancer of the right breast, pper outer quadrant. Clinical stage 1 a, ER positive, PR positive, HER-2 negative. I discussed with the patient and family members present today initial surgical treatment options. We discussed options of breast conservation with lumpectomy or total mastectomy and sentinal lymph node biopsy/dissection. she would be an excellent candidate for lumpectomy if biopsy of the adjacent area of calcifications is benign. Even if this were positive I believe both areas could be adequately encompassed with a single lumpectomy After discussion they have elected to proceed with breast conservation withlumpectomy and sentinel lymph node biopsy. we wit calcificationsstereotactic biopsy of the adjacent calcificationsinitially.We discussed the indications and nature of the procedure, and expected recovery, in detail. Surgical risks including anesthetic complications, cardiorespiratory complications, bleeding, infection, wound healing complications, blood clots, lymphedema, local and distant recurrence and possible need for further surgery based on the final pathology was discussed and understood. Chemotherapy, hormonal  therapy and radiation therapy have been discussed. They have been provided with literature regarding the treatment of breast cancer. All questions were answered. They understand and agree to proceed and we will go ahead with scheduling. Current Plans Pt Education - [facility] Breast Cancer Information Given - [employer] "Breast Journey" Package radioactive seed localized right breast lumpectomy and right axillary sentinel lymph node biopsy following stereotactic biopsy of calcifications adjacent to known cancer

## 2015-10-17 ENCOUNTER — Encounter (HOSPITAL_COMMUNITY)
Admission: RE | Admit: 2015-10-17 | Discharge: 2015-10-17 | Disposition: A | Discharge status: Home / Self Care | Payer: Medicare Other | Source: Ambulatory Visit | Attending: General Surgery | Admitting: General Surgery

## 2015-10-17 ENCOUNTER — Encounter (HOSPITAL_BASED_OUTPATIENT_CLINIC_OR_DEPARTMENT_OTHER)
Admission: RE | Disposition: A | Discharge status: Home / Self Care | Payer: Self-pay | Source: Ambulatory Visit | Attending: General Surgery

## 2015-10-17 ENCOUNTER — Encounter (HOSPITAL_BASED_OUTPATIENT_CLINIC_OR_DEPARTMENT_OTHER): Payer: Self-pay

## 2015-10-17 ENCOUNTER — Ambulatory Visit
Admission: RE | Admit: 2015-10-17 | Discharge: 2015-10-17 | Disposition: A | Discharge status: Home / Self Care | Payer: Medicare Other | Source: Ambulatory Visit | Attending: General Surgery | Admitting: General Surgery

## 2015-10-17 ENCOUNTER — Ambulatory Visit (HOSPITAL_BASED_OUTPATIENT_CLINIC_OR_DEPARTMENT_OTHER): Payer: Medicare Other | Admitting: Certified Registered"

## 2015-10-17 ENCOUNTER — Ambulatory Visit (HOSPITAL_BASED_OUTPATIENT_CLINIC_OR_DEPARTMENT_OTHER)
Admission: RE | Admit: 2015-10-17 | Discharge: 2015-10-17 | Disposition: A | Discharge status: Home / Self Care | Payer: Medicare Other | Source: Ambulatory Visit | Attending: General Surgery | Admitting: General Surgery

## 2015-10-17 DIAGNOSIS — D0511 Intraductal carcinoma in situ of right breast: Secondary | ICD-10-CM | POA: Insufficient documentation

## 2015-10-17 DIAGNOSIS — Z7982 Long term (current) use of aspirin: Secondary | ICD-10-CM | POA: Insufficient documentation

## 2015-10-17 DIAGNOSIS — I10 Essential (primary) hypertension: Secondary | ICD-10-CM | POA: Insufficient documentation

## 2015-10-17 DIAGNOSIS — C50911 Malignant neoplasm of unspecified site of right female breast: Secondary | ICD-10-CM | POA: Diagnosis not present

## 2015-10-17 DIAGNOSIS — Z87891 Personal history of nicotine dependence: Secondary | ICD-10-CM | POA: Insufficient documentation

## 2015-10-17 DIAGNOSIS — Z79899 Other long term (current) drug therapy: Secondary | ICD-10-CM | POA: Diagnosis not present

## 2015-10-17 DIAGNOSIS — E119 Type 2 diabetes mellitus without complications: Secondary | ICD-10-CM | POA: Diagnosis not present

## 2015-10-17 DIAGNOSIS — Z170 Estrogen receptor positive status [ER+]: Secondary | ICD-10-CM | POA: Insufficient documentation

## 2015-10-17 HISTORY — PX: BREAST LUMPECTOMY WITH RADIOACTIVE SEED AND SENTINEL LYMPH NODE BIOPSY: SHX6550

## 2015-10-17 HISTORY — PX: BREAST LUMPECTOMY: SHX2

## 2015-10-17 LAB — GLUCOSE, CAPILLARY
Glucose-Capillary: 84 mg/dL (ref 65–99)
Glucose-Capillary: 157 mg/dL — ABNORMAL HIGH (ref 65–99)

## 2015-10-17 SURGERY — BREAST LUMPECTOMY WITH RADIOACTIVE SEED AND SENTINEL LYMPH NODE BIOPSY
Anesthesia: Regional | Site: Breast | Laterality: Right

## 2015-10-17 MED ORDER — SCOPOLAMINE 1 MG/3DAYS TD PT72
MEDICATED_PATCH | TRANSDERMAL | Status: AC
  Filled 2015-10-17: qty 1

## 2015-10-17 MED ORDER — FENTANYL CITRATE (PF) 100 MCG/2ML IJ SOLN
INTRAMUSCULAR | Status: AC
  Filled 2015-10-17: qty 2

## 2015-10-17 MED ORDER — SODIUM CHLORIDE 0.9 % IJ SOLN
INTRAMUSCULAR | Status: AC
  Filled 2015-10-17: qty 10

## 2015-10-17 MED ORDER — TECHNETIUM TC 99M SULFUR COLLOID FILTERED
1.0000 | Freq: Once | INTRAVENOUS | Status: AC | PRN
  Administered 2015-10-17: 1 via INTRADERMAL

## 2015-10-17 MED ORDER — BUPIVACAINE-EPINEPHRINE (PF) 0.5% -1:200000 IJ SOLN
INTRAMUSCULAR | Status: AC
  Filled 2015-10-17: qty 30

## 2015-10-17 MED ORDER — DEXAMETHASONE SODIUM PHOSPHATE 4 MG/ML IJ SOLN
INTRAMUSCULAR | Status: DC | PRN
  Administered 2015-10-17: 4 mg via INTRAVENOUS

## 2015-10-17 MED ORDER — ONDANSETRON HCL 4 MG/2ML IJ SOLN
INTRAMUSCULAR | Status: AC
  Filled 2015-10-17: qty 2

## 2015-10-17 MED ORDER — LACTATED RINGERS IV SOLN
INTRAVENOUS | Status: DC
  Administered 2015-10-17 (×2): via INTRAVENOUS

## 2015-10-17 MED ORDER — PROMETHAZINE HCL 25 MG/ML IJ SOLN
6.2500 mg | Freq: Once | INTRAMUSCULAR | Status: AC
  Administered 2015-10-17: 6.25 mg via INTRAVENOUS

## 2015-10-17 MED ORDER — DEXTROSE 5 % IV SOLN
10.0000 mg | INTRAVENOUS | Status: DC | PRN
  Administered 2015-10-17: 50 ug/min via INTRAVENOUS

## 2015-10-17 MED ORDER — OXYCODONE HCL 5 MG PO TABS: 5.0000 mg | ORAL_TABLET | Freq: Four times a day (QID) | ORAL | Status: DC | PRN

## 2015-10-17 MED ORDER — METHYLENE BLUE 0.5 % INJ SOLN
INTRAVENOUS | Status: AC
  Filled 2015-10-17: qty 10

## 2015-10-17 MED ORDER — METOCLOPRAMIDE HCL 5 MG/ML IJ SOLN
10.0000 mg | Freq: Once | INTRAMUSCULAR | Status: AC | PRN
  Administered 2015-10-17: 10 mg via INTRAVENOUS

## 2015-10-17 MED ORDER — BUPIVACAINE-EPINEPHRINE (PF) 0.25% -1:200000 IJ SOLN
INTRAMUSCULAR | Status: AC
  Filled 2015-10-17: qty 30

## 2015-10-17 MED ORDER — SODIUM CHLORIDE 0.9 % IJ SOLN
INTRAVENOUS | Status: DC | PRN
  Administered 2015-10-17: 5 mL

## 2015-10-17 MED ORDER — SCOPOLAMINE 1 MG/3DAYS TD PT72: 1.0000 | MEDICATED_PATCH | Freq: Once | TRANSDERMAL | Status: DC | PRN

## 2015-10-17 MED ORDER — FENTANYL CITRATE (PF) 100 MCG/2ML IJ SOLN
50.0000 ug | INTRAMUSCULAR | Status: AC | PRN
  Administered 2015-10-17 (×2): 25 ug via INTRAVENOUS
  Administered 2015-10-17 (×3): 50 ug via INTRAVENOUS

## 2015-10-17 MED ORDER — CEFAZOLIN SODIUM 1-5 GM-% IV SOLN
INTRAVENOUS | Status: AC
  Filled 2015-10-17: qty 50

## 2015-10-17 MED ORDER — PHENYLEPHRINE HCL 10 MG/ML IJ SOLN
INTRAMUSCULAR | Status: DC | PRN
  Administered 2015-10-17: 40 ug via INTRAVENOUS
  Administered 2015-10-17: 80 ug via INTRAVENOUS

## 2015-10-17 MED ORDER — CEFAZOLIN SODIUM-DEXTROSE 2-4 GM/100ML-% IV SOLN
INTRAVENOUS | Status: AC
  Filled 2015-10-17: qty 100

## 2015-10-17 MED ORDER — ONDANSETRON HCL 4 MG/2ML IJ SOLN: 4.0000 mg | Freq: Once | INTRAMUSCULAR | Status: DC | PRN

## 2015-10-17 MED ORDER — PROPOFOL 10 MG/ML IV BOLUS
INTRAVENOUS | Status: DC | PRN
  Administered 2015-10-17: 30 mg via INTRAVENOUS
  Administered 2015-10-17: 170 mg via INTRAVENOUS

## 2015-10-17 MED ORDER — PROMETHAZINE HCL 25 MG/ML IJ SOLN
INTRAMUSCULAR | Status: AC
  Filled 2015-10-17: qty 1

## 2015-10-17 MED ORDER — FENTANYL CITRATE (PF) 100 MCG/2ML IJ SOLN
25.0000 ug | INTRAMUSCULAR | Status: DC | PRN
  Administered 2015-10-17: 25 ug via INTRAVENOUS

## 2015-10-17 MED ORDER — CHLORHEXIDINE GLUCONATE 4 % EX LIQD: 1.0000 "application " | Freq: Once | CUTANEOUS | Status: DC

## 2015-10-17 MED ORDER — MIDAZOLAM HCL 2 MG/2ML IJ SOLN
INTRAMUSCULAR | Status: AC
  Filled 2015-10-17: qty 2

## 2015-10-17 MED ORDER — ONDANSETRON HCL 4 MG/2ML IJ SOLN
INTRAMUSCULAR | Status: DC | PRN
  Administered 2015-10-17: 4 mg via INTRAVENOUS

## 2015-10-17 MED ORDER — GLYCOPYRROLATE 0.2 MG/ML IJ SOLN
0.2000 mg | Freq: Once | INTRAMUSCULAR | Status: AC | PRN
  Administered 2015-10-17: 0.2 mg via INTRAVENOUS

## 2015-10-17 MED ORDER — METOCLOPRAMIDE HCL 5 MG/ML IJ SOLN
INTRAMUSCULAR | Status: AC
  Filled 2015-10-17: qty 2

## 2015-10-17 MED ORDER — THYROTROPIN ALFA 1.1 MG IM SOLR: 0.9000 mg | INTRAMUSCULAR | Status: DC

## 2015-10-17 MED ORDER — SUCCINYLCHOLINE CHLORIDE 20 MG/ML IJ SOLN
INTRAMUSCULAR | Status: DC | PRN
  Administered 2015-10-17: 200 mg via INTRAVENOUS

## 2015-10-17 MED ORDER — LIDOCAINE-EPINEPHRINE (PF) 1 %-1:200000 IJ SOLN
INTRAMUSCULAR | Status: AC
  Filled 2015-10-17: qty 30

## 2015-10-17 MED ORDER — MIDAZOLAM HCL 2 MG/2ML IJ SOLN
1.0000 mg | INTRAMUSCULAR | Status: DC | PRN
  Administered 2015-10-17 (×2): 1 mg via INTRAVENOUS

## 2015-10-17 MED ORDER — PHENYLEPHRINE HCL 10 MG/ML IJ SOLN
INTRAMUSCULAR | Status: AC
  Filled 2015-10-17: qty 1

## 2015-10-17 MED ORDER — DEXAMETHASONE SODIUM PHOSPHATE 10 MG/ML IJ SOLN
INTRAMUSCULAR | Status: AC
  Filled 2015-10-17: qty 1

## 2015-10-17 MED ORDER — OXYCODONE HCL 5 MG PO TABS: 5.0000 mg | ORAL_TABLET | Freq: Once | ORAL | Status: DC | PRN

## 2015-10-17 MED ORDER — BUPIVACAINE-EPINEPHRINE 0.25% -1:200000 IJ SOLN
INTRAMUSCULAR | Status: DC | PRN
  Administered 2015-10-17: 14 mL

## 2015-10-17 MED ORDER — OXYCODONE HCL 5 MG/5ML PO SOLN: 5.0000 mg | Freq: Once | ORAL | Status: DC | PRN

## 2015-10-17 MED ORDER — LIDOCAINE 2% (20 MG/ML) 5 ML SYRINGE
INTRAMUSCULAR | Status: AC
  Filled 2015-10-17: qty 5

## 2015-10-17 MED ORDER — DEXTROSE 5 % IV SOLN
3.0000 g | INTRAVENOUS | Status: AC
  Administered 2015-10-17: 08:00:00 via INTRAVENOUS
  Administered 2015-10-17: 2 g via INTRAVENOUS

## 2015-10-17 MED ORDER — LIDOCAINE 2% (20 MG/ML) 5 ML SYRINGE
INTRAMUSCULAR | Status: DC | PRN
  Administered 2015-10-17: 60 mg via INTRAVENOUS

## 2015-10-17 SURGICAL SUPPLY — 43 items
APPLIER CLIP 9.375 MED OPEN (MISCELLANEOUS) ×2
BINDER BREAST XXLRG (GAUZE/BANDAGES/DRESSINGS) ×4 IMPLANT
BLADE SURG 15 STRL LF DISP TIS (BLADE) ×1 IMPLANT
BLADE SURG 15 STRL SS (BLADE) ×1
CANISTER SUCT 1200ML W/VALVE (MISCELLANEOUS) ×2 IMPLANT
CHLORAPREP W/TINT 26ML (MISCELLANEOUS) ×2 IMPLANT
CLIP APPLIE 9.375 MED OPEN (MISCELLANEOUS) ×1 IMPLANT
COVER BACK TABLE 60X90IN (DRAPES) ×2 IMPLANT
COVER MAYO STAND STRL (DRAPES) ×2 IMPLANT
COVER PROBE W GEL 5X96 (DRAPES) ×2 IMPLANT
DEVICE DUBIN W/COMP PLATE 8390 (MISCELLANEOUS) ×2 IMPLANT
DRAPE LAPAROSCOPIC ABDOMINAL (DRAPES) ×2 IMPLANT
DRAPE UTILITY XL STRL (DRAPES) ×2 IMPLANT
ELECT COATED BLADE 2.86 ST (ELECTRODE) ×2 IMPLANT
ELECT REM PT RETURN 9FT ADLT (ELECTROSURGICAL) ×2
ELECTRODE REM PT RTRN 9FT ADLT (ELECTROSURGICAL) ×1 IMPLANT
GLOVE BIO SURGEON STRL SZ7 (GLOVE) ×2 IMPLANT
GLOVE BIOGEL PI IND STRL 6.5 (GLOVE) ×1 IMPLANT
GLOVE BIOGEL PI IND STRL 8 (GLOVE) ×1 IMPLANT
GLOVE BIOGEL PI INDICATOR 6.5 (GLOVE) ×1
GLOVE BIOGEL PI INDICATOR 8 (GLOVE) ×1
GLOVE ECLIPSE 7.5 STRL STRAW (GLOVE) ×2 IMPLANT
GOWN STRL REUS W/ TWL LRG LVL3 (GOWN DISPOSABLE) ×1 IMPLANT
GOWN STRL REUS W/ TWL XL LVL3 (GOWN DISPOSABLE) ×1 IMPLANT
GOWN STRL REUS W/TWL LRG LVL3 (GOWN DISPOSABLE) ×1
GOWN STRL REUS W/TWL XL LVL3 (GOWN DISPOSABLE) ×1
KIT MARKER MARGIN INK (KITS) ×2 IMPLANT
LIQUID BAND (GAUZE/BANDAGES/DRESSINGS) ×4 IMPLANT
NDL SAFETY ECLIPSE 18X1.5 (NEEDLE) ×1 IMPLANT
NEEDLE HYPO 18GX1.5 SHARP (NEEDLE) ×1
NEEDLE HYPO 25X1 1.5 SAFETY (NEEDLE) ×4 IMPLANT
NS IRRIG 1000ML POUR BTL (IV SOLUTION) ×2 IMPLANT
PACK BASIN DAY SURGERY FS (CUSTOM PROCEDURE TRAY) ×2 IMPLANT
PENCIL BUTTON HOLSTER BLD 10FT (ELECTRODE) ×2 IMPLANT
SLEEVE SCD COMPRESS KNEE MED (MISCELLANEOUS) ×2 IMPLANT
SPONGE LAP 4X18 X RAY DECT (DISPOSABLE) ×2 IMPLANT
SUT MON AB 5-0 PS2 18 (SUTURE) ×2 IMPLANT
SUT VICRYL 3-0 CR8 SH (SUTURE) ×2 IMPLANT
SYR CONTROL 10ML LL (SYRINGE) ×4 IMPLANT
TOWEL OR 17X24 6PK STRL BLUE (TOWEL DISPOSABLE) ×2 IMPLANT
TOWEL OR NON WOVEN STRL DISP B (DISPOSABLE) ×2 IMPLANT
TUBE CONNECTING 20X1/4 (TUBING) ×2 IMPLANT
YANKAUER SUCT BULB TIP NO VENT (SUCTIONS) ×2 IMPLANT

## 2015-10-17 NOTE — Progress Notes (Signed)
Assisted Dr. Joslin with right, ultrasound guided, pectoralis block. Side rails up, monitors on throughout procedure. See vital signs in flow sheet. Tolerated Procedure well. 

## 2015-10-17 NOTE — Anesthesia Procedure Notes (Signed)
Procedure Name: Intubation Date/Time: 10/17/2015 7:56 AM Performed by: Baxter Flattery Pre-anesthesia Checklist: Patient identified, Emergency Drugs available, Suction available and Patient being monitored Patient Re-evaluated:Patient Re-evaluated prior to inductionOxygen Delivery Method: Circle system utilized Preoxygenation: Pre-oxygenation with 100% oxygen Intubation Type: IV induction Ventilation: Mask ventilation without difficulty Laryngoscope Size: Miller and 2 Grade View: Grade II Tube type: Oral Tube size: 7.0 mm Number of attempts: 3 (LMA attempted x 2, unable to obtain good seal , east OETT x 1) Airway Equipment and Method: Stylet,  Oral airway and Bite block Placement Confirmation: ETT inserted through vocal cords under direct vision,  positive ETCO2 and breath sounds checked- equal and bilateral Secured at: 22 cm Tube secured with: Tape Dental Injury: Teeth and Oropharynx as per pre-operative assessment

## 2015-10-17 NOTE — Transfer of Care (Signed)
Immediate Anesthesia Transfer of Care Note  Patient: Denise [name]  Procedure(s) Performed: Procedure(s): RIGHT BREAST LUMPECTOMY WITH RADIOACTIVE SEED AND RIGHT AXILLARY SENTINEL LYMPH NODE BIOPSY (Right)  Patient Location: PACU  Anesthesia Type:GA combined with regional for post-op pain  Level of Consciousness: awake, alert  and oriented  Airway & Oxygen Therapy: Patient Spontanous Breathing and Patient connected to face mask oxygen  Post-op Assessment: Report given to RN, Post -op Vital signs reviewed and stable and Patient moving all extremities  Post vital signs: Reviewed and stable  Last Vitals:  Filed Vitals:   10/17/15 0633  BP: 153/74  Pulse: 71  Temp: 36.8 C  Resp: 20    Last Pain: There were no vitals filed for this visit.       Complications: No apparent anesthesia complications

## 2015-10-17 NOTE — Interval H&P Note (Signed)
History and Physical Interval Note:  10/17/2015 7:25 AM  Denise [name]  has presented today for surgery, with the diagnosis of RIGHT BREAST CANCER  The various methods of treatment have been discussed with the patient and family. After consideration of risks, benefits and other options for treatment, the patient has consented to  Procedure(s) with comments: RIGHT BREAST LUMPECTOMY WITH RADIOACTIVE SEED AND RIGHT AXILLARY SENTINEL LYMPH NODE BIOPSY (Right) - RIGHT BREAST LUMPECTOMY WITH RADIOACTIVE SEED AND RIGHT AXILLARY SENTINEL LYMPH NODE BIOPSY as a surgical intervention .  The patient's history has been reviewed, patient examined, no change in status, stable for surgery.  I have reviewed the patient's chart and labs.  Questions were answered to the patient's satisfaction.     [name]

## 2015-10-17 NOTE — Op Note (Signed)
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  A second area of calcifications 2 cm posterior to the malignancy has also been biopsied showing fibroadenoma.  After preoperative workup and discussion detailed elsewhere we have elected to proceed with radioactive seed localized right breast lumpectomy and right axillary sentinel lymph node  Biopsy as initial surgical treatment.    Procedure Detail:  In the holding area the patient underwent a pectoral block by anesthesiaand underwent injection of 1 mCi of technetium sulfur colloid intradermally around the right nipple. See placement was confirmed with the neoprobe. Patient was taken to the operating room, placed in the supine position on the operating table and general endotracheal anesthesia was induced after a laryngeal mask failed to produce an adequate airway. Under sterile techniqueafter patient timeout I injected 5 mL of dilute methylene blue subcutaneously beneath the right nipple and massaged this for several minutes. Following this the entire right breast and axilla and upper arm were widely sterilely prepped and draped. She received preoperative IV antibiotics. PAS were in place. Patient timeout was again performed  And correct procedure verified.  The lumpectomy was approached initially. A curvilinear incision was made in a skin crease overlying the seed in the lateral right breast. Dissection was carried down into the subcutaneous tissue. The seed was placed  About a centimeter anterior and  lateral to the marking clip in tumor. Taking this into account  A generous specimen of breast tissue was excised around the  Seed. As the dissection progressed and was able to feel a palpable mass within the specimen. As I came across the posterior aspect of the specimen I entered a biopsy hematoma which contained the coil shaped clip from the fibroadenoma biopsy and an associated small mass was included in the posterior aspect of the specimen. The specimen was removed and margins inked. Specimen x-ray showed the seed and  Marking clip from the malignancy well contained. Grossly the tumor felt well contained in the specimen. This was sent for permanent pathology. Complete hemostasis was obtained in the wound. The lumpectomy cavity was marked with clips. The breast and subcutaneous tissue was closed with interrupted 3-0 Vicryl. Attention was turned to the  Sentinel lymph node biopsy. A definite hot area in the right axilla was localized and a small transverse incision made. Dissection was carried down through the subcutaneous tissue with cautery and the clavipectoral fascia incised. Using the neoprobe for guidance I dissected bluntly down onto a slightly enlarged bright blue lymph node with high counts. This was elevated and completely excised with cautery. Ex vivo the node had counts of almost 2000.  This was sent as hot blue axillary sentinel lymph node. At this point I could not obtain  Any significant counts in the axilla and there was no palpable adenopathy or visible blue dye. The  Axillary and simultaneous tissue was closed with interrupted 3-0 Vicryl. Both incisions were infiltrated with Marcaine and closed with subcuticular 5-0 Monocryl and Dermabond. Sponge needle and instrument counts were correct.    Findings: As above  Estimated Blood Loss:  Minimal  Drains: none  Blood Given: none          Specimens: #1 right breast lumpectomy   #2 hot blue right axillary sentinel lymph node         Complications:  * No complications entered in OR log *         Disposition: PACU - hemodynamically stable.         Condition: stable

## 2015-10-17 NOTE — Anesthesia Postprocedure Evaluation (Signed)
Anesthesia Post Note  Patient: Denise [name]  Procedure(s) Performed: Procedure(s) (LRB): RIGHT BREAST LUMPECTOMY WITH RADIOACTIVE SEED AND RIGHT AXILLARY SENTINEL LYMPH NODE BIOPSY (Right)  Patient location during evaluation: PACU Anesthesia Type: General Level of consciousness: awake, awake and alert and oriented Pain management: pain level controlled Vital Signs Assessment: post-procedure vital signs reviewed and stable Respiratory status: spontaneous breathing, nonlabored ventilation and respiratory function stable Cardiovascular status: blood pressure returned to baseline Anesthetic complications: no    Last Vitals:  Filed Vitals:   10/17/15 1030 10/17/15 1100  BP: 135/72 140/70  Pulse: 74 66  Temp:  36.7 C  Resp: 19 18    Last Pain:  Filed Vitals:   10/17/15 1117  PainSc: 4                  Thi Klich COKER

## 2015-10-17 NOTE — Anesthesia Preprocedure Evaluation (Addendum)
Anesthesia Evaluation  Patient identified by MRN, date of birth, ID band Patient awake    Reviewed: Allergy & Precautions, NPO status , Patient's Chart, lab work & pertinent test results  Airway Mallampati: II  TM Distance: >3 FB Neck ROM: Full    Dental  (+) Teeth Intact, Dental Advisory Given   Pulmonary former smoker,  breath sounds clear to auscultation        Cardiovascular hypertension, Rhythm:Regular Rate:Normal     Neuro/Psych    GI/Hepatic   Endo/Other  diabetes  Renal/GU      Musculoskeletal   Abdominal   Peds  Hematology   Anesthesia Other Findings   Reproductive/Obstetrics                             Anesthesia Physical Anesthesia Plan  ASA: III  Anesthesia Plan: General   Post-op Pain Management:    Induction: Intravenous  Airway Management Planned: LMA  Additional Equipment:   Intra-op Plan:   Post-operative Plan:   Informed Consent: I have reviewed the patients History and Physical, chart, labs and discussed the procedure including the risks, benefits and alternatives for the proposed anesthesia with the patient or authorized representative who has indicated his/her understanding and acceptance.   Dental advisory given  Plan Discussed with: CRNA and Anesthesiologist  Anesthesia Plan Comments:         Anesthesia Quick Evaluation  

## 2015-10-17 NOTE — Discharge Instructions (Signed)
[facility] °Office Phone Number [phone] ° °BREAST BIOPSY/ PARTIAL MASTECTOMY: POST OP INSTRUCTIONS ° °Always review your discharge instruction sheet given to you by the facility where your surgery was performed. ° °IF YOU HAVE DISABILITY OR FAMILY LEAVE FORMS, YOU MUST BRING THEM TO THE OFFICE FOR PROCESSING.  DO NOT GIVE THEM TO YOUR DOCTOR. ° °1. A prescription for pain medication may be given to you upon discharge.  Take your pain medication as prescribed, if needed.  If narcotic pain medicine is not needed, then you may take acetaminophen (Tylenol) or ibuprofen (Advil) as needed. °2. Take your usually prescribed medications unless otherwise directed °3. If you need a refill on your pain medication, please contact your pharmacy.  They will contact our office to request authorization.  Prescriptions will not be filled after 5pm or on week-ends. °4. You should eat very light the first 24 hours after surgery, such as soup, crackers, pudding, etc.  Resume your normal diet the day after surgery. °5. Most patients will experience some swelling and bruising in the breast.  Ice packs and a good support bra will help.  Swelling and bruising can take several days to resolve.  °6. It is common to experience some constipation if taking pain medication after surgery.  Increasing fluid intake and taking a stool softener will usually help or prevent this problem from occurring.  A mild laxative (Milk of Magnesia or Miralax) should be taken according to package directions if there are no bowel movements after 48 hours. °7. Unless discharge instructions indicate otherwise, you may remove your bandages 24-48 hours after surgery, and you may shower at that time.  You may have steri-strips (small skin tapes) in place directly over the incision.  These strips should be left on the skin for 7-10 days.  If your surgeon used skin glue on the incision, you may shower in 24 hours.  The glue will flake off over the  next 2-3 weeks.  Any sutures or staples will be removed at the office during your follow-up visit. °8. ACTIVITIES:  You may resume regular daily activities (gradually increasing) beginning the next day.  Wearing a good support bra or sports bra minimizes pain and swelling.  You may have sexual intercourse when it is comfortable. °a. You may drive when you no longer are taking prescription pain medication, you can comfortably wear a seatbelt, and you can safely maneuver your car and apply brakes. °b. RETURN TO WORK:  ______________________________________________________________________________________ °9. You should see your doctor in the office for a follow-up appointment approximately two weeks after your surgery.  Your doctor’s nurse will typically make your follow-up appointment when she calls you with your pathology report.  Expect your pathology report 2-3 business days after your surgery.  You may call to check if you do not hear from us after three days. °10. OTHER INSTRUCTIONS: _______________________________________________________________________________________________ _____________________________________________________________________________________________________________________________________ °_____________________________________________________________________________________________________________________________________ °_____________________________________________________________________________________________________________________________________ ° °WHEN TO CALL YOUR DOCTOR: °1. Fever over 101.0 °2. Nausea and/or vomiting. °3. Extreme swelling or bruising. °4. Continued bleeding from incision. °5. Increased pain, redness, or drainage from the incision. ° °The clinic staff is available to answer your questions during regular business hours.  Please don’t hesitate to call and ask to speak to one of the nurses for clinical concerns.  If you have a medical emergency, go to the nearest  emergency room or call 911.  A surgeon from [facility] is always on call at the hospital. ° °For further questions, please visit [email or web address]  ° ° ° °  Post Anesthesia Home Care Instructions ° °Activity: °Get plenty of rest for the remainder of the day. A responsible adult should stay with you for 24 hours following the procedure.  °For the next 24 hours, DO NOT: °-Drive a car °-Operate machinery °-Drink alcoholic beverages °-Take any medication unless instructed by your physician °-Make any legal decisions or sign important papers. ° °Meals: °Start with liquid foods such as gelatin or soup. Progress to regular foods as tolerated. Avoid greasy, spicy, heavy foods. If nausea and/or vomiting occur, drink only clear liquids until the nausea and/or vomiting subsides. Call your physician if vomiting continues. ° °Special Instructions/Symptoms: °Your throat may feel dry or sore from the anesthesia or the breathing tube placed in your throat during surgery. If this causes discomfort, gargle with warm salt water. The discomfort should disappear within 24 hours. ° °If you had a scopolamine patch placed behind your ear for the management of post- operative nausea and/or vomiting: ° °1. The medication in the patch is effective for 72 hours, after which it should be removed.  Wrap patch in a tissue and discard in the trash. Wash hands thoroughly with soap and water. °2. You may remove the patch earlier than 72 hours if you experience unpleasant side effects which may include dry mouth, dizziness or visual disturbances. °3. Avoid touching the patch. Wash your hands with soap and water after contact with the patch. °  ° °

## 2015-10-18 ENCOUNTER — Encounter (HOSPITAL_BASED_OUTPATIENT_CLINIC_OR_DEPARTMENT_OTHER): Payer: Self-pay | Admitting: General Surgery

## 2015-10-24 ENCOUNTER — Ambulatory Visit (HOSPITAL_BASED_OUTPATIENT_CLINIC_OR_DEPARTMENT_OTHER): Payer: Medicare Other | Admitting: Hematology and Oncology

## 2015-10-24 ENCOUNTER — Encounter: Payer: Self-pay | Admitting: Hematology and Oncology

## 2015-10-24 VITALS — BP 158/86 | HR 73 | Temp 98.5°F | Resp 18 | Wt 251.2 lb

## 2015-10-24 DIAGNOSIS — C50411 Malignant neoplasm of upper-outer quadrant of right female breast: Secondary | ICD-10-CM | POA: Diagnosis not present

## 2015-10-24 NOTE — Assessment & Plan Note (Signed)
Right lumpectomy 10/16/2068: IDC grade 2, 1.2 cm, intermediate grade DCIS, margins negative although less than 0.1 cm to medial margin and superior margin, 0/1 lymph node negative, ER 100%, PR 90%, HER-2 negative ratio 1.37, T1 cN0 stage IA  Pathology counseling: I discussed the final pathology report of the patient provided  a copy of this report. I discussed the margins as well as lymph node surgeries. We also discussed the final staging along with previously performed ER/PR and HER-2/neu testing.  Recommendation: 1. Oncotype DX testing to determine if chemotherapy would be of any benefit followed by 2. Adjuvant radiation therapy followed by 3. Adjuvant antiestrogen therapy  Return to clinic based upon Oncotype DX score result.

## 2015-10-24 NOTE — Progress Notes (Signed)
 Patient Care Team: James W John, MD as PCP - General Heather Thompson Mackey, NP as Nurse Practitioner (Hematology and Oncology)  DIAGNOSIS: Breast cancer of upper-outer quadrant of right female breast (HCC)   Staging form: Breast, AJCC 7th Edition     Clinical stage from 09/27/2015: Stage IA (T1b, N0, M0) - Unsigned       Staging comments: Staged at breast conference on 5.31.17  SUMMARY OF ONCOLOGIC HISTORY:   Breast cancer of upper-outer quadrant of right female breast (HCC)   09/18/2015 Initial Diagnosis Screening right breast mass 9:00: 1 x 0.9 x 0.7 cm: Grade 2 IDC plus DCIS ER 100%, PR 90%, HER-2 negative ratio 1.38, Ki-67 10%; UOQ lesion 3 mm calcification plus distortion: Bx rec; T1 BN 0 stage IA clinical stage   10/17/2015 Surgery Right lumpectomy: IDC grade 2, 1.2 cm, intermediate grade DCIS, margins negative although less than 0.1 cm to medial margin and superior margin, 0/1 lymph node negative, ER 100%, PR 90%, HER-2 negative ratio 1.37, T1 cN0 stage IA    CHIEF COMPLIANT: Follow-up after right lumpectomy  INTERVAL HISTORY: Denise [name] is a 70-year-old with above-mentioned history of right breast cancer. Lumpectomy and is here to discuss the pathology report. She is healing very well from surgery. She has very minimal discomfort in the breast.  REVIEW OF SYSTEMS:   Constitutional: Denies fevers, chills or abnormal weight loss Eyes: Denies blurriness of vision Ears, nose, mouth, throat, and face: Denies mucositis or sore throat Respiratory: Denies cough, dyspnea or wheezes Cardiovascular: Denies palpitation, chest discomfort Gastrointestinal:  Denies nausea, heartburn or change in bowel habits Skin: Denies abnormal skin rashes Lymphatics: Denies new lymphadenopathy or easy bruising Neurological:Denies numbness, tingling or new weaknesses Behavioral/Psych: Mood is stable, no new changes  Extremities: No lower extremity edema Breast: Recent right lumpectomy All  other systems were reviewed with the patient and are negative.  I have reviewed the past medical history, past surgical history, social history and family history with the patient and they are unchanged from previous note.  ALLERGIES:  is allergic to clonidine hydrochloride; hydrocodone-acetaminophen; rosiglitazone maleate; and erythromycin.  MEDICATIONS:  Current Outpatient Prescriptions  Medication Sig Dispense Refill  . amLODipine (NORVASC) 5 MG tablet TAKE ONE TABLET BY MOUTH ONCE DAILY. 90 tablet 1  . aspirin 81 MG EC tablet Take 81 mg by mouth daily.      . atorvastatin (LIPITOR) 40 MG tablet TAKE ONE TABLET BY MOUTH DAILY. 90 tablet 3  . cetirizine (ZYRTEC) 10 MG tablet TAKE ONE TABLET BY MOUTH ONCE DAILY AS NEEDED FOR  ALLERGIES 90 tablet 0  . Cholecalciferol (VITAMIN D3) 1000 UNITS CAPS Take 1 each by mouth daily. 90 capsule 1  . fluticasone (FLONASE) 50 MCG/ACT nasal spray Place 2 sprays into both nostrils daily. 16 g 5  . Fluticasone-Salmeterol (ADVAIR DISKUS) 100-50 MCG/DOSE AEPB Inhale 1 puff into the lungs 2 (two) times daily. 180 each 1  . furosemide (LASIX) 40 MG tablet TAKE ONE TABLET BY MOUTH TWICE DAILY 180 tablet 0  . glimepiride (AMARYL) 2 MG tablet TAKE ONE TABLET BY MOUTH ONCE DAILY BEFORE BREAKFAST 90 tablet 3  . glucose blood (FREESTYLE LITE) test strip Use as instructed once daily 200 each 11  . levalbuterol (XOPENEX HFA) 45 MCG/ACT inhaler Inhale 2 puffs into the lungs every 6 (six) hours as needed for wheezing or shortness of breath.    . lisinopril (PRINIVIL,ZESTRIL) 40 MG tablet TAKE ONE TABLET BY MOUTH ONCE DAILY 90 tablet 0  .   metFORMIN (GLUCOPHAGE-XR) 500 MG 24 hr tablet 4 tabs mouth in the AM 360 tablet 3  . Multiple Vitamin (MULTIVITAMIN) capsule Take 1 capsule by mouth daily.      . Omega-3 Fatty Acids (FISH OIL) 500 MG CAPS Take by mouth 3 (three) times daily.      Marland Kitchen oxyCODONE (OXY IR/ROXICODONE) 5 MG immediate release tablet Take 1-2 tablets (5-10 mg  total) by mouth every 6 (six) hours as needed for moderate pain, severe pain or breakthrough pain. 30 tablet 0  . potassium chloride (MICRO-K) 10 MEQ CR capsule TAKE FOUR CAPSULES BY MOUTH ONCE DAILY 360 capsule 0   No current facility-administered medications for this visit.    PHYSICAL EXAMINATION: ECOG PERFORMANCE STATUS: 1 - Symptomatic but completely ambulatory  Filed Vitals:   10/24/15 1137  BP: 158/86  Pulse: 73  Temp: 98.5 F (36.9 C)  Resp: 18   Filed Weights   10/24/15 1137  Weight: 251 lb 3.2 oz (113.944 kg)    GENERAL:alert, no distress and comfortable SKIN: skin color, texture, turgor are normal, no rashes or significant lesions EYES: normal, Conjunctiva are pink and non-injected, sclera clear OROPHARYNX:no exudate, no erythema and lips, buccal mucosa, and tongue normal  NECK: supple, thyroid normal size, non-tender, without nodularity LYMPH:  no palpable lymphadenopathy in the cervical, axillary or inguinal LUNGS: clear to auscultation and percussion with normal breathing effort HEART: regular rate & rhythm and no murmurs and no lower extremity edema ABDOMEN:abdomen soft, non-tender and normal bowel sounds MUSCULOSKELETAL:no cyanosis of digits and no clubbing  NEURO: alert & oriented x 3 with fluent speech, no focal motor/sensory deficits EXTREMITIES: No lower extremity edema   LABORATORY DATA:  I have reviewed the data as listed   Chemistry      Component Value Date/Time   NA 145 09/27/2015 0840   NA 143 08/23/2015 1014   NA 138 09/05/2014   K 3.7 09/27/2015 0840   K 3.7 08/23/2015 1014   CL 105 08/23/2015 1014   CO2 27 09/27/2015 0840   CO2 31 08/23/2015 1014   BUN 8.8 09/27/2015 0840   BUN 11 08/23/2015 1014   BUN 9 09/05/2014   CREATININE 0.8 09/27/2015 0840   CREATININE 0.76 08/23/2015 1014   CREATININE 0.7 09/05/2014   GLU 82 09/05/2014      Component Value Date/Time   CALCIUM 9.2 09/27/2015 0840   CALCIUM 9.7 08/23/2015 1014   ALKPHOS  61 09/27/2015 0840   ALKPHOS 60 08/23/2015 1014   AST 22 09/27/2015 0840   AST 25 08/23/2015 1014   ALT 28 09/27/2015 0840   ALT 29 08/23/2015 1014   BILITOT 0.65 09/27/2015 0840   BILITOT 0.9 08/23/2015 1014       Lab Results  Component Value Date   WBC 9.8 09/27/2015   HGB 13.8 09/27/2015   HCT 42.7 09/27/2015   MCV 82.9 09/27/2015   PLT 217 09/27/2015   NEUTROABS 5.4 09/27/2015     ASSESSMENT & PLAN:  Breast cancer of upper-outer quadrant of right female breast ([place]) Right lumpectomy 10/16/2068: IDC grade 2, 1.2 cm, intermediate grade DCIS, margins negative although less than 0.1 cm to medial margin and superior margin, 0/1 lymph node negative, ER 100%, PR 90%, HER-2 negative ratio 1.37, T1 cN0 stage IA  Pathology counseling: I discussed the final pathology report of the patient provided  a copy of this report. I discussed the margins as well as lymph node surgeries. We also discussed the final staging along with  previously performed ER/PR and HER-2/neu testing.  Recommendation: 1. Oncotype DX testing to determine if chemotherapy would be of any benefit followed by 2. Adjuvant radiation therapy followed by 3. Adjuvant antiestrogen therapy  Return to clinic based upon Oncotype DX score result.     No orders of the defined types were placed in this encounter.   The patient has a good understanding of the overall plan. she agrees with it. she will call with any problems that may develop before the next visit here.   Rulon Eisenmenger, MD 10/24/2015

## 2015-10-25 ENCOUNTER — Telehealth: Payer: Self-pay | Admitting: *Deleted

## 2015-10-25 NOTE — Telephone Encounter (Signed)
Ordered Oncotype Dx test per Dr. Lindi Adie.  Faxed order to [facility] and called Varney Biles to verify she received it.  Faxed order to [facility].  Faxed order to [facility].  Placed a note to look for results.

## 2015-10-27 ENCOUNTER — Other Ambulatory Visit: Payer: Self-pay | Admitting: Internal Medicine

## 2015-11-02 ENCOUNTER — Telehealth: Payer: Self-pay | Admitting: *Deleted

## 2015-11-02 ENCOUNTER — Encounter (HOSPITAL_COMMUNITY): Payer: Self-pay

## 2015-11-02 DIAGNOSIS — C50411 Malignant neoplasm of upper-outer quadrant of right female breast: Secondary | ICD-10-CM | POA: Diagnosis not present

## 2015-11-02 NOTE — Telephone Encounter (Signed)
Received Oncotype Dx results of 15/9%.  Placed a copy in Dr. Geralyn Flash box and took one to HIM to scan.

## 2015-11-03 ENCOUNTER — Other Ambulatory Visit: Payer: Self-pay | Admitting: Internal Medicine

## 2015-11-03 ENCOUNTER — Telehealth: Payer: Self-pay | Admitting: *Deleted

## 2015-11-03 NOTE — Telephone Encounter (Signed)
Received oncotype score of 15. Called pt and gave results and informed she does not need chemo per Dr. Lindi Adie Confirmed appt with Dr. [name] on 7/13. Denies further needs or concerns.

## 2015-11-07 NOTE — Progress Notes (Addendum)
Location of Breast Cancer  Right Breast  Upper Outer Quadrant    Histology per Pathology Report: Diagnosis 09/18/2015 : Breast, right, needle core biopsy, 9:00 o'clock - INVASIVE MAMMARY CARCINOMA, SEE COMMENT.- MAMMARY CARCINOMA IN SITU.  Receptor Status: ER(100%+), PR (90%+), Her2-neu (neg ratio=1.38), Ki-(10%)  Did patient present with symptoms (if so, please note symptoms) or was this found on screening mammography?:   Past/Anticipated interventions by surgeon, if [id number] 10/17/2015 : Dr. Marland Kitchen [place],MD 1. Breast, lumpectomy, Right - INVASIVE DUCTAL CARCINOMA, GRADE 2, SPANNING 1.2 CM. - DUCTAL CARCINOMA IN SITU, INTERMEDIATE GRADE. - INVASIVE DUCTAL CARCINOMA COMES TO WITHIN LESS THAN 0.1 CM OF THE MEDIAL MARGIN (BROADLY) AND SUPERIOR MARGIN (FOCALLY). - RESECTION MARGINS ARE NEGATIVE FOR DUCTAL CARCINOMA IN SITU. - SEE ONCOLOGY TABLE. 2. Lymph node, sentinel, biopsy, Right Axillary - ONE OF ONE LYMPH NODES NEGATIVE FOR CARCINOMA (0/1).  Saw Dr. Excell Seltzer this past Friday 11/03/15, aspirated fluid, old blood stated patient, follow up 11/24/15 1130am  Past/Anticipated interventions by medical oncology, if any: Chemotherapy : Dr. Lindi Adie  Last note 10/24/15  Oncotype DX results=15  Lymphedema issues, if any:  NO  Pain issues, if any: soreness ,discomfort right breast, hot flashes,sweats occasionally, right breast still  swollen , hardened , and warm still, bruising  SAFETY ISSUES: Yes, walks with a cane, gets sob easily  Prior radiation? No  Pacemaker/ICD?  NO  Possible current pregnancy? N/A  Is the patient on methotrexate?  No  Current Complaints / other details:  Seen by Dr. Pablo Ledger 5/31/217, patient is a home health nurse,  Menses age 60,  [id number],age 1st live birth age 80, birth control pills early 20's 3-4 years, then IUD 5 years, no HRT, menopause around age 79 Maternal cousin 38rd  Breast cousin BP 163/83 mmHg  Pulse 84  Temp(Src) 98.3 F (36.8 C) (Oral)   Resp 20  Wt 250 lb 4.8 oz (113.535 kg)  Wt Readings from Last 3 Encounters:  11/09/15 250 lb 4.8 oz (113.535 kg)  10/24/15 251 lb 3.2 oz (113.944 kg)  10/17/15 246 lb (111.585 kg)   Allergies:Clonidine Hydrochloride,hydrocodone-aacetaminophen,rosiglitazone maleate, erythromycin     Rebecca Eaton, RN 11/07/2015,2:14 PM

## 2015-11-09 ENCOUNTER — Ambulatory Visit
Admission: RE | Admit: 2015-11-09 | Discharge: 2015-11-09 | Disposition: A | Discharge status: Home / Self Care | Payer: Medicare Other | Source: Ambulatory Visit | Attending: Radiation Oncology | Admitting: Radiation Oncology

## 2015-11-09 ENCOUNTER — Encounter: Payer: Self-pay | Admitting: Radiation Oncology

## 2015-11-09 VITALS — BP 163/83 | HR 84 | Temp 98.3°F | Resp 20 | Wt 250.3 lb

## 2015-11-09 DIAGNOSIS — Z510 Encounter for antineoplastic radiation therapy: Secondary | ICD-10-CM | POA: Diagnosis not present

## 2015-11-09 DIAGNOSIS — C50411 Malignant neoplasm of upper-outer quadrant of right female breast: Secondary | ICD-10-CM | POA: Diagnosis not present

## 2015-11-09 NOTE — Progress Notes (Signed)
Please see the Nurse Progress Note in the MD Initial Consult Encounter for this patient. 

## 2015-11-09 NOTE — Progress Notes (Signed)
Radiation Oncology         [phone] ________________________________  Name: Denise [name] MRN: [id number]  Date: 11/09/2015  DOB: [birth year]  Re-Consult Visit Note  CC: Cathlean Cower, MD  Excell Seltzer, MD  Diagnosis:    Breast cancer of upper-outer quadrant of right female breast [facility])   Staging form: Breast, AJCC 7th Edition     Clinical stage from 09/27/2015: Stage IA (T1b, N0, M0) - Unsigned       Staging comments: Staged at breast conference on 5.31.17    Narrative:  The patient returns today for re-consult.  She has completed lumpectomy and now presents for further discussion and coordination of an anticipated course of radiation treatment. The patient has seen medical oncology. She will not receive chemotherapy. The patient will be receiving anti-hormonal treatment after radiation treatment.  The patient is unaccompanied and states that she is doing okay. She reports having discomfort and soreness in the right breast as well as bruising associated with recent lumpectomy. She notes hardening in the right breast as well. She met with Dr. Excell Seltzer on Friday, 7/7 and removed some fluid from her right breast at that time. Dr. Excell Seltzer is following this and does not plan on draining anymore fluid at this time.               ALLERGIES:  is allergic to clonidine hydrochloride; hydrocodone-acetaminophen; rosiglitazone maleate; and erythromycin.  Meds: Current Outpatient Prescriptions  Medication Sig Dispense Refill  . amLODipine (NORVASC) 5 MG tablet TAKE ONE TABLET BY MOUTH ONCE DAILY. 90 tablet 1  . aspirin 81 MG EC tablet Take 81 mg by mouth daily.      Marland Kitchen atorvastatin (LIPITOR) 40 MG tablet TAKE ONE TABLET BY MOUTH DAILY. 90 tablet 3  . cetirizine (ZYRTEC) 10 MG tablet TAKE ONE TABLET BY MOUTH ONCE DAILY AS NEEDED FOR  ALLERGIES 90 tablet 0  . Cholecalciferol (VITAMIN D3) 1000 UNITS CAPS Take 1 each by mouth daily. 90 capsule 1  . fluticasone (FLONASE) 50 MCG/ACT nasal  spray Place 2 sprays into both nostrils daily. 16 g 5  . Fluticasone-Salmeterol (ADVAIR DISKUS) 100-50 MCG/DOSE AEPB Inhale 1 puff into the lungs 2 (two) times daily. 180 each 1  . furosemide (LASIX) 40 MG tablet TAKE ONE TABLET BY MOUTH TWICE DAILY 180 tablet 0  . glimepiride (AMARYL) 2 MG tablet TAKE ONE TABLET BY MOUTH ONCE DAILY BEFORE BREAKFAST 90 tablet 3  . glucose blood (FREESTYLE LITE) test strip Use as instructed once daily 200 each 11  . levalbuterol (XOPENEX HFA) 45 MCG/ACT inhaler Inhale 2 puffs into the lungs every 6 (six) hours as needed for wheezing or shortness of breath.    . lisinopril (PRINIVIL,ZESTRIL) 40 MG tablet TAKE ONE TABLET BY MOUTH ONCE DAILY 90 tablet 0  . metFORMIN (GLUCOPHAGE-XR) 500 MG 24 hr tablet 4 tabs mouth in the AM 360 tablet 3  . Multiple Vitamin (MULTIVITAMIN) capsule Take 1 capsule by mouth daily.      . Omega-3 Fatty Acids (FISH OIL) 500 MG CAPS Take 1,000 capsules by mouth daily.     Marland Kitchen oxyCODONE (OXY IR/ROXICODONE) 5 MG immediate release tablet Take 1-2 tablets (5-10 mg total) by mouth every 6 (six) hours as needed for moderate pain, severe pain or breakthrough pain. 30 tablet 0  . potassium chloride (MICRO-K) 10 MEQ CR capsule TAKE FOUR CAPSULES BY MOUTH ONCE DAILY 360 capsule 0   No current facility-administered medications for this encounter.    Physical Findings: The  patient is in no acute distress. Patient is alert and oriented.  weight is 250 lb 4.8 oz (113.535 kg). Her oral temperature is 98.3 F (36.8 C). Her blood pressure is 163/83 and her pulse is 84. Her respiration is 20. Marland Kitchen    General: Well-developed, in no acute distress HEENT: Normocephalic, atraumatic; oral cavity clear Neck: Supple without any lymphadenopathy Cardiovascular: Regular rate and rhythm Respiratory: Clear to auscultation bilaterally Breast: Patient has a well healed surgical incision laterally in the right breast. Appears that there is an underlying hematoma present.  No significant erythema in the overlying skin.  GI: Soft, nontender, normal bowel sounds Extremities: No edema present Neuro: No focal deficits   Lab Findings: Lab Results  Component Value Date   WBC 9.8 09/27/2015   HGB 13.8 09/27/2015   HCT 42.7 09/27/2015   MCV 82.9 09/27/2015   PLT 217 09/27/2015     Radiographic Findings: Nm Sentinel Node Inj-no Rpt (breast)  10/17/2015  CLINICAL DATA: cancer right breast Sulfur colloid was injected intradermally by the nuclear medicine technologist for breast cancer sentinel node localization.   Mm Breast Surgical Specimen  10/17/2015  CLINICAL DATA:  Status post lumpectomy for grade 2 invasive mammary carcinoma of the right breast. Patient had preoperative radioactive seed localization. EXAM: SPECIMEN RADIOGRAPH OF THE RIGHT BREAST COMPARISON:  Previous exam(s). FINDINGS: Status post excision of the right breast. The radioactive seed and ribbon shaped marker clip are present within the lumpectomy spot, completely intact, and were marked for pathology. In a separate specimen, a coil shaped clip was removed, marking the site of a benign fibroadenoma. IMPRESSION: Specimen radiograph of the right breast. Findings are discussed with Dr. Excell Seltzer at the time of surgery. Electronically Signed   By: Nolon Nations M.D.   On: 10/17/2015 08:51   Mm Rt Radioactive Seed Loc Mammo Guide  10/16/2015  CLINICAL DATA:  Recent diagnosis of right breast cancer, stage I. The patient presents for radioactive seed localization prior to lumpectomy. A ribbon shaped biopsy clip was placed at the site of the biopsied cancer in the outer right breast. EXAM: MAMMOGRAPHIC GUIDED RADIOACTIVE SEED LOCALIZATION OF THE RIGHT BREAST COMPARISON:  Previous exam(s). FINDINGS: Patient presents for radioactive seed localization prior to lumpectomy. I met with the patient and we discussed the procedure of seed localization including benefits and alternatives. We discussed the high  likelihood of a successful procedure. We discussed the risks of the procedure including infection, bleeding, tissue injury and further surgery. We discussed the low dose of radioactivity involved in the procedure. Informed, written consent was given. The usual time-out protocol was performed immediately prior to the procedure. Using mammographic guidance, sterile technique, 1% lidocaine and an I-125 radioactive seed, a mass with associated ribbon shaped biopsy clip was localized using a lateral to medial approach. The follow-up mammogram images show the seed approximately 8 mm directly lateral from the lateral margin of the mass. Images were marked for Dr. Excell Seltzer. Follow-up survey of the patient confirms presence of the radioactive seed. Order number of I-125 seed:  [id number]. Total activity:  5.456 millicuries  Reference Date: 06 October 2015 The patient tolerated the procedure well and was released from the [place]. She was given instructions regarding seed removal. IMPRESSION: Radioactive seed localization right breast. The radioactive seed is 8 mm directly lateral to the lateral margin of the mass. No apparent complications. Electronically Signed   By: Curlene Dolphin M.D.   On: 10/16/2015 15:28    Impression:  Breast cancer of upper-outer quadrant of right female breast [facility])   Staging form: Breast, AJCC 7th Edition     Clinical stage from 09/27/2015: Stage IA (T1b, N0, M0) - Unsigned       Staging comments: Staged at breast conference on 5.31.17    The patient has completed surgery. She is suitable to proceed with adjuvant radiotherapy at this time.  I once again discussed the rationale for radiation treatment in this setting. We discussed the potential benefit of radiation treatment in terms of local/regional control. We also discussed the possible side effects and risks of a possible course of radiation treatment. All of her questions were answered.  The patient does wish to proceed with  radiation treatment.  Plan:  The patient will be scheduled for a simulation in the near future such that we can proceed with radiation treatment planning. I anticipate treating the patient for approximately 6.5 weeks to the right breast using tangent fields.  ------------------------------------------------  Jodelle Gross, MD, PhD  This document serves as a record of services personally performed by Kyung Rudd, MD. It was created on his behalf by Arlyce Harman, a trained medical scribe. The creation of this record is based on the scribe's personal observations and the provider's statements to them. This document has been checked and approved by the attending provider.

## 2015-11-14 ENCOUNTER — Ambulatory Visit (INDEPENDENT_AMBULATORY_CARE_PROVIDER_SITE_OTHER): Payer: Medicare Other | Admitting: Internal Medicine

## 2015-11-14 ENCOUNTER — Encounter: Payer: Self-pay | Admitting: Internal Medicine

## 2015-11-14 VITALS — BP 134/72 | HR 100 | Temp 98.8°F | Resp 20 | Wt 249.0 lb

## 2015-11-14 DIAGNOSIS — E119 Type 2 diabetes mellitus without complications: Secondary | ICD-10-CM

## 2015-11-14 DIAGNOSIS — J45909 Unspecified asthma, uncomplicated: Secondary | ICD-10-CM | POA: Diagnosis not present

## 2015-11-14 DIAGNOSIS — R05 Cough: Secondary | ICD-10-CM

## 2015-11-14 DIAGNOSIS — R062 Wheezing: Secondary | ICD-10-CM | POA: Diagnosis not present

## 2015-11-14 DIAGNOSIS — I10 Essential (primary) hypertension: Secondary | ICD-10-CM

## 2015-11-14 DIAGNOSIS — R059 Cough, unspecified: Secondary | ICD-10-CM | POA: Insufficient documentation

## 2015-11-14 MED ORDER — PREDNISONE 10 MG PO TABS: ORAL_TABLET | ORAL | Status: DC

## 2015-11-14 MED ORDER — METHYLPREDNISOLONE ACETATE 40 MG/ML IJ SUSP
40.0000 mg | Freq: Once | INTRAMUSCULAR | Status: AC
  Administered 2015-11-14: 40 mg via INTRAMUSCULAR

## 2015-11-14 MED ORDER — BUDESONIDE-FORMOTEROL FUMARATE 160-4.5 MCG/ACT IN AERO: 2.0000 | INHALATION_SPRAY | Freq: Two times a day (BID) | RESPIRATORY_TRACT | Status: DC

## 2015-11-14 MED ORDER — HYDROCODONE-HOMATROPINE 5-1.5 MG/5ML PO SYRP: 5.0000 mL | ORAL_SOLUTION | Freq: Four times a day (QID) | ORAL | Status: DC | PRN

## 2015-11-14 MED ORDER — LEVOFLOXACIN 500 MG PO TABS: 500.0000 mg | ORAL_TABLET | Freq: Every day | ORAL | Status: DC

## 2015-11-14 MED ORDER — ALBUTEROL SULFATE HFA 108 (90 BASE) MCG/ACT IN AERS: 2.0000 | INHALATION_SPRAY | Freq: Four times a day (QID) | RESPIRATORY_TRACT | Status: DC | PRN

## 2015-11-14 NOTE — Patient Instructions (Addendum)
You had the steroid shot today  OK to stop the advair  Please take all new medication as prescribed  - the antibiotic, cough medicine, prednisone, and symbicort inhaler, and the rescue albuterol inhaler  Please continue all other medications as before, and refills have been done if requested.  Please have the pharmacy call with any other refills you may need.  Please keep your appointments with your specialists as you may have planned  Please go to the XRAY Department in the Basement (go straight as you get off the elevator) for the x-ray testing - tomorrow  You will be contacted by phone if any changes need to be made immediately.  Otherwise, you will receive a letter about your results with an explanation, but please check with MyChart first.  Please remember to sign up for MyChart if you have not done so, as this will be important to you in the future with finding out test results, communicating by private email, and scheduling acute appointments online when needed.
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
## 2015-11-14 NOTE — Progress Notes (Signed)
Pre visit review using our clinic review tool, if applicable. No additional management support is needed unless otherwise documented below in the visit note. 

## 2015-11-15 ENCOUNTER — Ambulatory Visit (INDEPENDENT_AMBULATORY_CARE_PROVIDER_SITE_OTHER)
Admission: RE | Admit: 2015-11-15 | Discharge: 2015-11-15 | Disposition: A | Discharge status: Home / Self Care | Payer: Medicare Other | Source: Ambulatory Visit | Attending: Internal Medicine | Admitting: Internal Medicine

## 2015-11-15 DIAGNOSIS — R062 Wheezing: Secondary | ICD-10-CM | POA: Diagnosis not present

## 2015-11-15 DIAGNOSIS — R0602 Shortness of breath: Secondary | ICD-10-CM | POA: Diagnosis not present

## 2015-11-15 DIAGNOSIS — R059 Cough, unspecified: Secondary | ICD-10-CM

## 2015-11-15 DIAGNOSIS — R05 Cough: Secondary | ICD-10-CM

## 2015-11-15 MED ORDER — METHYLPREDNISOLONE ACETATE 40 MG/ML IJ SUSP: 40.0000 mg | Freq: Once | INTRAMUSCULAR | Status: DC

## 2015-11-15 NOTE — Addendum Note (Signed)
Addended by: Della Goo C on: 11/15/2015 11:27 AM   Modules accepted: Orders

## 2015-11-15 NOTE — Assessment & Plan Note (Signed)
Also for rescue inhaler prn,  to f/u any worsening symptoms or concerns

## 2015-11-15 NOTE — Assessment & Plan Note (Signed)
Mild to mod, c/w bronchitis vs pna, for cxr in am, also for antibx course,  Cough med prn, to f/u any worsening symptoms or concerns

## 2015-11-15 NOTE — Assessment & Plan Note (Signed)
Mild to mod, c/w bornchospastic type, for depomedrol and predpac course,  to f/u any worsening symptoms or concerns

## 2015-11-15 NOTE — Assessment & Plan Note (Signed)
stable overall by history and exam, recent data reviewed with pt, and pt to continue medical treatment as before,  to f/u any worsening symptoms or concerns BP Readings from Last 3 Encounters:  11/14/15 134/72  11/09/15 163/83  10/24/15 158/86

## 2015-11-15 NOTE — Assessment & Plan Note (Signed)
stable overall by history and exam, recent data reviewed with pt, and pt to continue medical treatment as before,  to f/u any worsening symptoms or concerns Lab Results  Component Value Date   HGBA1C 6.9* 08/23/2015

## 2015-11-17 ENCOUNTER — Ambulatory Visit
Admission: RE | Admit: 2015-11-17 | Discharge: 2015-11-17 | Disposition: A | Discharge status: Home / Self Care | Payer: Medicare Other | Source: Ambulatory Visit | Attending: Radiation Oncology | Admitting: Radiation Oncology

## 2015-11-17 DIAGNOSIS — C50411 Malignant neoplasm of upper-outer quadrant of right female breast: Secondary | ICD-10-CM

## 2015-11-17 DIAGNOSIS — Z510 Encounter for antineoplastic radiation therapy: Secondary | ICD-10-CM | POA: Diagnosis not present

## 2015-11-19 NOTE — Progress Notes (Signed)
  Radiation Oncology         ([phone]) [phone] ________________________________  Name: Denise [name] MRN: [id number]  Date: 11/17/2015  DOB: 03/24/1946  DIAGNOSIS:   No diagnosis found.   SIMULATION AND TREATMENT PLANNING NOTE  The patient presented for simulation prior to beginning her course of radiation treatment for her diagnosis of right-sided breast cancer. The patient was placed in a supine position on a breast board. A customized vac-lock bag was constructed and this complex treatment device will be used on a daily basis during her treatment. In this fashion, a CT scan was obtained through the chest area and an isocenter was placed near the chest wall within the breast.  The patient will be planned to receive a course of radiation initially to a dose of 50.4 Gy. This will consist of a whole breast radiotherapy technique. To accomplish this, 2 customized blocks have been designed which will correspond to medial and lateral whole breast tangent fields. This treatment will be accomplished at 1.8 Gy per fraction. A forward planning technique will also be evaluated to determine if this approach improves the plan. It is anticipated that the patient will then receive a 14 Gy boost to the seroma cavity which has been contoured. This will be accomplished at 2 Gy per fraction.   This initial treatment will consist of a 3-D conformal technique. The seroma has been contoured as the primary target structure. Additionally, dose volume histograms of both this target as well as the lungs and heart will also be evaluated. Such an approach is necessary to ensure that the target area is adequately covered while the nearby critical  normal structures are adequately spared.  Plan:  The final anticipated total dose therefore will correspond to 64.4 Gy.    _______________________________   Jodelle Gross, MD, PhD

## 2015-11-19 NOTE — Progress Notes (Signed)
  Radiation Oncology         [phone]) [phone] ________________________________  Name: Denise [name] MRN: [id number]  Date: 11/17/2015  DOB: 04-03-1946  Optical Surface Tracking Plan:  Since intensity modulated radiotherapy (IMRT) and 3D conformal radiation treatment methods are predicated on accurate and precise positioning for treatment, intrafraction motion monitoring is medically necessary to ensure accurate and safe treatment delivery.  The ability to quantify intrafraction motion without excessive ionizing radiation dose can only be performed with optical surface tracking. Accordingly, surface imaging offers the opportunity to obtain 3D measurements of patient position throughout IMRT and 3D treatments without excessive radiation exposure.  I am ordering optical surface tracking for this patient's upcoming course of radiotherapy. ________________________________  Kyung Rudd, MD 11/19/2015 12:56 PM    Reference:   Ursula Alert, J, et al. Surface imaging-based analysis of intrafraction motion for breast radiotherapy patients.Journal of [place], n. 6, nov. 2014. ISSN [id number].   Available at: <http://www.jacmp.org/index.php/jacmp/article/view/4957>.

## 2015-11-20 ENCOUNTER — Other Ambulatory Visit: Payer: Self-pay | Admitting: *Deleted

## 2015-11-20 ENCOUNTER — Encounter: Payer: Self-pay | Admitting: *Deleted

## 2015-11-20 DIAGNOSIS — C50411 Malignant neoplasm of upper-outer quadrant of right female breast: Secondary | ICD-10-CM | POA: Diagnosis not present

## 2015-11-21 DIAGNOSIS — C50411 Malignant neoplasm of upper-outer quadrant of right female breast: Secondary | ICD-10-CM | POA: Diagnosis not present

## 2015-11-21 DIAGNOSIS — Z510 Encounter for antineoplastic radiation therapy: Secondary | ICD-10-CM | POA: Diagnosis not present

## 2015-11-24 ENCOUNTER — Ambulatory Visit
Admission: RE | Admit: 2015-11-24 | Discharge: 2015-11-24 | Disposition: A | Discharge status: Home / Self Care | Payer: Medicare Other | Source: Ambulatory Visit | Attending: Radiation Oncology | Admitting: Radiation Oncology

## 2015-11-24 DIAGNOSIS — C50411 Malignant neoplasm of upper-outer quadrant of right female breast: Secondary | ICD-10-CM | POA: Diagnosis not present

## 2015-11-24 DIAGNOSIS — Z510 Encounter for antineoplastic radiation therapy: Secondary | ICD-10-CM | POA: Diagnosis not present

## 2015-11-27 ENCOUNTER — Ambulatory Visit
Admission: RE | Admit: 2015-11-27 | Discharge: 2015-11-27 | Disposition: A | Discharge status: Home / Self Care | Payer: Medicare Other | Source: Ambulatory Visit | Attending: Radiation Oncology | Admitting: Radiation Oncology

## 2015-11-27 DIAGNOSIS — Z510 Encounter for antineoplastic radiation therapy: Secondary | ICD-10-CM | POA: Diagnosis not present

## 2015-11-27 DIAGNOSIS — C50411 Malignant neoplasm of upper-outer quadrant of right female breast: Secondary | ICD-10-CM | POA: Diagnosis not present

## 2015-11-28 ENCOUNTER — Ambulatory Visit
Admission: RE | Admit: 2015-11-28 | Discharge: 2015-11-28 | Disposition: A | Discharge status: Home / Self Care | Payer: Medicare Other | Source: Ambulatory Visit | Attending: Radiation Oncology | Admitting: Radiation Oncology

## 2015-11-28 DIAGNOSIS — C50411 Malignant neoplasm of upper-outer quadrant of right female breast: Secondary | ICD-10-CM | POA: Diagnosis not present

## 2015-11-28 DIAGNOSIS — Z510 Encounter for antineoplastic radiation therapy: Secondary | ICD-10-CM | POA: Diagnosis not present

## 2015-11-29 ENCOUNTER — Ambulatory Visit
Admission: RE | Admit: 2015-11-29 | Discharge: 2015-11-29 | Disposition: A | Discharge status: Home / Self Care | Payer: Medicare Other | Source: Ambulatory Visit | Attending: Radiation Oncology | Admitting: Radiation Oncology

## 2015-11-29 VITALS — Wt 249.5 lb

## 2015-11-29 DIAGNOSIS — C50411 Malignant neoplasm of upper-outer quadrant of right female breast: Secondary | ICD-10-CM | POA: Diagnosis not present

## 2015-11-29 DIAGNOSIS — Z510 Encounter for antineoplastic radiation therapy: Secondary | ICD-10-CM | POA: Diagnosis not present

## 2015-11-29 MED ORDER — ALRA NON-METALLIC DEODORANT (RAD-ONC)
1.0000 "application " | Freq: Once | TOPICAL | Status: AC
  Administered 2015-11-29: 1 via TOPICAL

## 2015-11-29 MED ORDER — RADIAPLEXRX EX GEL
Freq: Once | CUTANEOUS | Status: DC
Start: 2015-11-29 — End: 2015-11-29

## 2015-11-29 MED ORDER — RADIAPLEXRX EX GEL
Freq: Once | CUTANEOUS | Status: AC
Start: 2015-11-29 — End: 2015-11-29
  Administered 2015-11-29: 09:00:00 via TOPICAL

## 2015-11-29 MED ORDER — ALRA NON-METALLIC DEODORANT (RAD-ONC)
1.0000 | Freq: Once | TOPICAL | Status: DC
Start: 2015-11-29 — End: 2015-11-29

## 2015-11-29 NOTE — Progress Notes (Signed)
Patient education done, Radiation therapy and you book, my business card,alra deodorant,radiaplex cream given to the patient,discussed ways to manage side effects,fatigue,skin irritation,swelling/soreness dryness of breat, increase protein in diet, use of electric shaver for under arms,stay hydrated,drink plenty fluids, apply radiaplex and deodorant after radiation treatments and bedtime, teach back given 8:56 AM

## 2015-11-30 ENCOUNTER — Ambulatory Visit
Admission: RE | Admit: 2015-11-30 | Discharge: 2015-11-30 | Disposition: A | Discharge status: Home / Self Care | Payer: Medicare Other | Source: Ambulatory Visit | Attending: Radiation Oncology | Admitting: Radiation Oncology

## 2015-11-30 DIAGNOSIS — Z510 Encounter for antineoplastic radiation therapy: Secondary | ICD-10-CM | POA: Diagnosis not present

## 2015-11-30 DIAGNOSIS — C50411 Malignant neoplasm of upper-outer quadrant of right female breast: Secondary | ICD-10-CM | POA: Diagnosis not present

## 2015-12-01 ENCOUNTER — Ambulatory Visit
Admission: RE | Admit: 2015-12-01 | Discharge: 2015-12-01 | Disposition: A | Discharge status: Home / Self Care | Payer: Medicare Other | Source: Ambulatory Visit | Attending: Radiation Oncology | Admitting: Radiation Oncology

## 2015-12-01 ENCOUNTER — Encounter: Payer: Self-pay | Admitting: Radiation Oncology

## 2015-12-01 VITALS — BP 150/97 | HR 74 | Temp 98.2°F | Ht 61.0 in | Wt 254.1 lb

## 2015-12-01 DIAGNOSIS — C50411 Malignant neoplasm of upper-outer quadrant of right female breast: Secondary | ICD-10-CM | POA: Diagnosis not present

## 2015-12-01 DIAGNOSIS — Z510 Encounter for antineoplastic radiation therapy: Secondary | ICD-10-CM | POA: Diagnosis not present

## 2015-12-01 NOTE — Progress Notes (Signed)
Department of Radiation Oncology  Phone:  [phone] Fax:        [phone]  Weekly Treatment Note    Name: Denise [name] Date: 12/01/2015 MRN: [id number] DOB: 05/01/1945   Diagnosis:     ICD-9-CM ICD-10-CM   1. Breast cancer of upper-outer quadrant of right female breast ([place]) 174.4 C50.411      Current dose: 9 Gy  Current fraction: 5   MEDICATIONS: Current Outpatient Prescriptions  Medication Sig Dispense Refill  . albuterol (PROVENTIL HFA;VENTOLIN HFA) 108 (90 Base) MCG/ACT inhaler Inhale 2 puffs into the lungs every 6 (six) hours as needed for wheezing or shortness of breath. 1 Inhaler 11  . amLODipine (NORVASC) 5 MG tablet TAKE ONE TABLET BY MOUTH ONCE DAILY. 90 tablet 1  . aspirin 81 MG EC tablet Take 81 mg by mouth daily.      Marland Kitchen atorvastatin (LIPITOR) 40 MG tablet TAKE ONE TABLET BY MOUTH DAILY. 90 tablet 3  . budesonide-formoterol (SYMBICORT) 160-4.5 MCG/ACT inhaler Inhale 2 puffs into the lungs 2 (two) times daily. 3 Inhaler 3  . cetirizine (ZYRTEC) 10 MG tablet TAKE ONE TABLET BY MOUTH ONCE DAILY AS NEEDED FOR  ALLERGIES 90 tablet 0  . Cholecalciferol (VITAMIN D3) 1000 UNITS CAPS Take 1 each by mouth daily. 90 capsule 1  . fluticasone (FLONASE) 50 MCG/ACT nasal spray Place 2 sprays into both nostrils daily. 16 g 5  . furosemide (LASIX) 40 MG tablet TAKE ONE TABLET BY MOUTH TWICE DAILY 180 tablet 0  . glimepiride (AMARYL) 2 MG tablet TAKE ONE TABLET BY MOUTH ONCE DAILY BEFORE BREAKFAST 90 tablet 3  . glucose blood (FREESTYLE LITE) test strip Use as instructed once daily 200 each 11  . lisinopril (PRINIVIL,ZESTRIL) 40 MG tablet TAKE ONE TABLET BY MOUTH ONCE DAILY 90 tablet 0  . metFORMIN (GLUCOPHAGE-XR) 500 MG 24 hr tablet 4 tabs mouth in the AM 360 tablet 3  . Multiple Vitamin (MULTIVITAMIN) capsule Take 1 capsule by mouth daily.      . Omega-3 Fatty Acids (FISH OIL) 500 MG CAPS Take 1,000 capsules by mouth daily.     . potassium chloride (MICRO-K) 10  MEQ CR capsule TAKE FOUR CAPSULES BY MOUTH ONCE DAILY 360 capsule 0  . ADVAIR DISKUS 100-50 MCG/DOSE AEPB Inhale into the lungs.    Marland Kitchen HYDROcodone-homatropine (HYCODAN) 5-1.5 MG/5ML syrup Take 5 mLs by mouth every 6 (six) hours as needed for cough. (Patient not taking: Reported on 12/01/2015) 180 mL 0  . levalbuterol (XOPENEX HFA) 45 MCG/ACT inhaler Inhale 2 puffs into the lungs every 6 (six) hours as needed for wheezing or shortness of breath.    . oxyCODONE (OXY IR/ROXICODONE) 5 MG immediate release tablet Take 1-2 tablets (5-10 mg total) by mouth every 6 (six) hours as needed for moderate pain, severe pain or breakthrough pain. (Patient not taking: Reported on 12/01/2015) 30 tablet 0   No current facility-administered medications for this encounter.      ALLERGIES: Clonidine hydrochloride; Hydrocodone-acetaminophen; Rosiglitazone maleate; and Erythromycin   LABORATORY DATA:  Lab Results  Component Value Date   WBC 9.8 09/27/2015   HGB 13.8 09/27/2015   HCT 42.7 09/27/2015   MCV 82.9 09/27/2015   PLT 217 09/27/2015   Lab Results  Component Value Date   NA 145 09/27/2015   K 3.7 09/27/2015   CL 105 08/23/2015   CO2 27 09/27/2015   Lab Results  Component Value Date   ALT 28 09/27/2015   AST 22 09/27/2015  ALKPHOS 61 09/27/2015   BILITOT 0.65 09/27/2015     NARRATIVE: Denise [name] was seen today for weekly treatment management. The chart was checked and the patient's films were reviewed.  [name] has completed 5 fractions to her right breast.  She reports feeling tightness in her right arm.  She also reports that her right breast feels swollen like before she had a hematoma drained last Friday.  She is using radiaplex.  She reports having fatigue.  The skin on her right breast has hyperpigmentation under her breast.  The breast also appears swollen with a firm area by her lumpectomy scar.  BP (!) 150/97 (BP Location: Left Arm, Patient Position: Sitting)   Pulse 74    Temp 98.2 F (36.8 C) (Oral)   Ht 5\' 1"  (1.549 m)   Wt 254 lb 1.6 oz (115.3 kg)   SpO2 98%   BMI 48.01 kg/m    Wt Readings from Last 3 Encounters:  12/01/15 254 lb 1.6 oz (115.3 kg)  11/29/15 249 lb 8 oz (113.2 kg)  11/14/15 249 lb (112.9 kg)    PHYSICAL EXAMINATION: height is 5\' 1"  (1.549 m) and weight is 254 lb 1.6 oz (115.3 kg). Her oral temperature is 98.2 F (36.8 C). Her blood pressure is 150/97 (abnormal) and her pulse is 74. Her oxygen saturation is 98%.        ASSESSMENT: The patient is doing satisfactorily with treatment.  PLAN: We will continue with the patient's radiation treatment as planned.

## 2015-12-01 NOTE — Progress Notes (Signed)
Denise [name] has completed 5 fractions to her right breast.  She reports feeling tightness in her right arm.  She also reports that her right breast feels swollen like before she had a hematoma drained last Friday.  She is using radiaplex.  She reports having fatigue.  The skin on her right breast has hyperpigmentation under her breast.  The breast also appears swollen with a firm area by her lumpectomy scar.  BP (!) 150/97 (BP Location: Left Arm, Patient Position: Sitting)   Pulse 74   Temp 98.2 F (36.8 C) (Oral)   Ht 5\' 1"  (1.549 m)   Wt 254 lb 1.6 oz (115.3 kg)   SpO2 98%   BMI 48.01 kg/m    Wt Readings from Last 3 Encounters:  12/01/15 254 lb 1.6 oz (115.3 kg)  11/29/15 249 lb 8 oz (113.2 kg)  11/14/15 249 lb (112.9 kg)

## 2015-12-04 ENCOUNTER — Ambulatory Visit
Admission: RE | Admit: 2015-12-04 | Discharge: 2015-12-04 | Disposition: A | Discharge status: Home / Self Care | Payer: Medicare Other | Source: Ambulatory Visit | Attending: Radiation Oncology | Admitting: Radiation Oncology

## 2015-12-04 DIAGNOSIS — Z510 Encounter for antineoplastic radiation therapy: Secondary | ICD-10-CM | POA: Diagnosis not present

## 2015-12-04 DIAGNOSIS — C50411 Malignant neoplasm of upper-outer quadrant of right female breast: Secondary | ICD-10-CM | POA: Diagnosis not present

## 2015-12-05 ENCOUNTER — Other Ambulatory Visit: Payer: Self-pay | Admitting: *Deleted

## 2015-12-05 ENCOUNTER — Ambulatory Visit
Admission: RE | Admit: 2015-12-05 | Discharge: 2015-12-05 | Disposition: A | Discharge status: Home / Self Care | Payer: Medicare Other | Source: Ambulatory Visit | Attending: Radiation Oncology | Admitting: Radiation Oncology

## 2015-12-05 ENCOUNTER — Telehealth: Payer: Self-pay | Admitting: *Deleted

## 2015-12-05 DIAGNOSIS — C50411 Malignant neoplasm of upper-outer quadrant of right female breast: Secondary | ICD-10-CM

## 2015-12-05 DIAGNOSIS — Z510 Encounter for antineoplastic radiation therapy: Secondary | ICD-10-CM | POA: Diagnosis not present

## 2015-12-05 NOTE — Telephone Encounter (Signed)
Spoke with patient to follow up after radiation start.  Patient states she is doing well but complains of some right arm swelling that comes and goes.  She asked about physical therapy and some exercises she could do to help.  Informed her I would place a referral for evaluation.

## 2015-12-06 ENCOUNTER — Ambulatory Visit
Admission: RE | Admit: 2015-12-06 | Discharge: 2015-12-06 | Disposition: A | Discharge status: Home / Self Care | Payer: Medicare Other | Source: Ambulatory Visit | Attending: Radiation Oncology | Admitting: Radiation Oncology

## 2015-12-06 ENCOUNTER — Ambulatory Visit: Payer: Medicare Other | Attending: General Surgery | Admitting: Physical Therapy

## 2015-12-06 DIAGNOSIS — M25511 Pain in right shoulder: Secondary | ICD-10-CM | POA: Diagnosis not present

## 2015-12-06 DIAGNOSIS — R2231 Localized swelling, mass and lump, right upper limb: Secondary | ICD-10-CM | POA: Diagnosis not present

## 2015-12-06 DIAGNOSIS — Z510 Encounter for antineoplastic radiation therapy: Secondary | ICD-10-CM | POA: Diagnosis not present

## 2015-12-06 DIAGNOSIS — C50411 Malignant neoplasm of upper-outer quadrant of right female breast: Secondary | ICD-10-CM | POA: Diagnosis not present

## 2015-12-06 DIAGNOSIS — R2232 Localized swelling, mass and lump, left upper limb: Secondary | ICD-10-CM
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
## 2015-12-07 ENCOUNTER — Ambulatory Visit
Admission: RE | Admit: 2015-12-07 | Discharge: 2015-12-07 | Disposition: A | Discharge status: Home / Self Care | Payer: Medicare Other | Source: Ambulatory Visit | Attending: Radiation Oncology | Admitting: Radiation Oncology

## 2015-12-07 DIAGNOSIS — M5137 Other intervertebral disc degeneration, lumbosacral region: Secondary | ICD-10-CM | POA: Diagnosis not present

## 2015-12-07 DIAGNOSIS — C50411 Malignant neoplasm of upper-outer quadrant of right female breast: Secondary | ICD-10-CM | POA: Diagnosis not present

## 2015-12-07 DIAGNOSIS — Z510 Encounter for antineoplastic radiation therapy: Secondary | ICD-10-CM | POA: Diagnosis not present

## 2015-12-08 ENCOUNTER — Ambulatory Visit
Admission: RE | Admit: 2015-12-08 | Discharge: 2015-12-08 | Disposition: A | Discharge status: Home / Self Care | Payer: Medicare Other | Source: Ambulatory Visit | Attending: Radiation Oncology | Admitting: Radiation Oncology

## 2015-12-08 VITALS — BP 145/69 | HR 69 | Temp 98.0°F | Resp 16 | Wt 249.2 lb

## 2015-12-08 DIAGNOSIS — Z510 Encounter for antineoplastic radiation therapy: Secondary | ICD-10-CM | POA: Diagnosis not present

## 2015-12-08 DIAGNOSIS — C50411 Malignant neoplasm of upper-outer quadrant of right female breast: Secondary | ICD-10-CM

## 2015-12-08 NOTE — Progress Notes (Signed)
PAIN: She is currently in no pain.  SKIN: Pt right breast- positive for Hyperpigmentation and "itching on the inside."  Pt denies edema.  Pt continues to apply Radiaplex as directed. OTHER: Pt complains of fatigue. BP (!) 145/69   Pulse 69   Temp 98 F (36.7 C) (Oral)   Resp 16   Wt 249 lb 3.2 oz (113 kg)   SpO2 97%   BMI 47.09 kg/m  Wt Readings from Last 3 Encounters:  12/08/15 249 lb 3.2 oz (113 kg)  12/01/15 254 lb 1.6 oz (115.3 kg)  11/29/15 249 lb 8 oz (113.2 kg)

## 2015-12-08 NOTE — Progress Notes (Signed)
Department of Radiation Oncology  Phone:  [phone] Fax:        [phone]  Weekly Treatment Note    Name: Denise [name] Date: 12/10/2015 MRN: [id number] DOB: 1946-01-11   Diagnosis:     ICD-9-CM ICD-10-CM   1. Breast cancer of upper-outer quadrant of right female breast ([place]) 174.4 C50.411      Current dose: 18 Gy  Current fraction: 10   MEDICATIONS: Current Outpatient Prescriptions  Medication Sig Dispense Refill  . ADVAIR DISKUS 100-50 MCG/DOSE AEPB Inhale into the lungs.    Marland Kitchen albuterol (PROVENTIL HFA;VENTOLIN HFA) 108 (90 Base) MCG/ACT inhaler Inhale 2 puffs into the lungs every 6 (six) hours as needed for wheezing or shortness of breath. 1 Inhaler 11  . amLODipine (NORVASC) 5 MG tablet TAKE ONE TABLET BY MOUTH ONCE DAILY. 90 tablet 1  . aspirin 81 MG EC tablet Take 81 mg by mouth daily.      Marland Kitchen atorvastatin (LIPITOR) 40 MG tablet TAKE ONE TABLET BY MOUTH DAILY. 90 tablet 3  . budesonide-formoterol (SYMBICORT) 160-4.5 MCG/ACT inhaler Inhale 2 puffs into the lungs 2 (two) times daily. 3 Inhaler 3  . cetirizine (ZYRTEC) 10 MG tablet TAKE ONE TABLET BY MOUTH ONCE DAILY AS NEEDED FOR  ALLERGIES 90 tablet 0  . Cholecalciferol (VITAMIN D3) 1000 UNITS CAPS Take 1 each by mouth daily. 90 capsule 1  . fluticasone (FLONASE) 50 MCG/ACT nasal spray Place 2 sprays into both nostrils daily. 16 g 5  . furosemide (LASIX) 40 MG tablet TAKE ONE TABLET BY MOUTH TWICE DAILY 180 tablet 0  . glimepiride (AMARYL) 2 MG tablet TAKE ONE TABLET BY MOUTH ONCE DAILY BEFORE BREAKFAST 90 tablet 3  . glucose blood (FREESTYLE LITE) test strip Use as instructed once daily 200 each 11  . HYDROcodone-homatropine (HYCODAN) 5-1.5 MG/5ML syrup Take 5 mLs by mouth every 6 (six) hours as needed for cough. (Patient not taking: Reported on 12/01/2015) 180 mL 0  . levalbuterol (XOPENEX HFA) 45 MCG/ACT inhaler Inhale 2 puffs into the lungs every 6 (six) hours as needed for wheezing or shortness of breath.     . lisinopril (PRINIVIL,ZESTRIL) 40 MG tablet TAKE ONE TABLET BY MOUTH ONCE DAILY 90 tablet 0  . metFORMIN (GLUCOPHAGE-XR) 500 MG 24 hr tablet 4 tabs mouth in the AM 360 tablet 3  . Multiple Vitamin (MULTIVITAMIN) capsule Take 1 capsule by mouth daily.      . Omega-3 Fatty Acids (FISH OIL) 500 MG CAPS Take 1,000 capsules by mouth daily.     Marland Kitchen oxyCODONE (OXY IR/ROXICODONE) 5 MG immediate release tablet Take 1-2 tablets (5-10 mg total) by mouth every 6 (six) hours as needed for moderate pain, severe pain or breakthrough pain. (Patient not taking: Reported on 12/01/2015) 30 tablet 0  . potassium chloride (MICRO-K) 10 MEQ CR capsule TAKE FOUR CAPSULES BY MOUTH ONCE DAILY 360 capsule 0   No current facility-administered medications for this encounter.      ALLERGIES: Clonidine hydrochloride; Hydrocodone-acetaminophen; Rosiglitazone maleate; and Erythromycin   LABORATORY DATA:  Lab Results  Component Value Date   WBC 9.8 09/27/2015   HGB 13.8 09/27/2015   HCT 42.7 09/27/2015   MCV 82.9 09/27/2015   PLT 217 09/27/2015   Lab Results  Component Value Date   NA 145 09/27/2015   K 3.7 09/27/2015   CL 105 08/23/2015   CO2 27 09/27/2015   Lab Results  Component Value Date   ALT 28 09/27/2015   AST 22 09/27/2015  ALKPHOS 61 09/27/2015   BILITOT 0.65 09/27/2015     NARRATIVE: Denise [name] was seen today for weekly treatment management. The chart was checked and the patient's films were reviewed.  Denise [name] returns for radiation treatment to the right breast. She continues to apply Radiaplex as directed. She complains of an "itching on the inside" sensation in the right breast. She reports fatigue. She denies any pain at this time. She feels she is doing alright, she has noticed feeling more tired getting up in the mornings and after treatment. She saw her surgeon again to discuss swelling in the right breast.   BP (!) 145/69   Pulse 69   Temp 98 F (36.7 C) (Oral)    Resp 16   Wt 249 lb 3.2 oz (113 kg)   SpO2 97%   BMI 47.09 kg/m    Wt Readings from Last 3 Encounters:  12/08/15 249 lb 3.2 oz (113 kg)  12/01/15 254 lb 1.6 oz (115.3 kg)  11/29/15 249 lb 8 oz (113.2 kg)    PHYSICAL EXAMINATION: weight is 249 lb 3.2 oz (113 kg). Her oral temperature is 98 F (36.7 C). Her blood pressure is 145/69 (abnormal) and her pulse is 69. Her respiration is 16 and oxygen saturation is 97%.      Alert, no acute distress.  ASSESSMENT: The patient is doing satisfactorily with treatment.  PLAN: We will continue with the patient's radiation treatment as planned.     This document serves as a record of services personally performed by Kyung Rudd, MD. It was created on his behalf by Arlyce Harman, a trained medical scribe. The creation of this record is based on the scribe's personal observations and the provider's statements to them. This document has been checked and approved by the attending provider.  ------------------------------------------------  Jodelle Gross, MD, PhD

## 2015-12-11 ENCOUNTER — Ambulatory Visit
Admission: RE | Admit: 2015-12-11 | Discharge: 2015-12-11 | Disposition: A | Discharge status: Home / Self Care | Payer: Medicare Other | Source: Ambulatory Visit | Attending: Radiation Oncology | Admitting: Radiation Oncology

## 2015-12-11 DIAGNOSIS — C50411 Malignant neoplasm of upper-outer quadrant of right female breast: Secondary | ICD-10-CM | POA: Diagnosis not present

## 2015-12-11 DIAGNOSIS — Z510 Encounter for antineoplastic radiation therapy: Secondary | ICD-10-CM | POA: Diagnosis not present

## 2015-12-12 ENCOUNTER — Ambulatory Visit
Admission: RE | Admit: 2015-12-12 | Discharge: 2015-12-12 | Disposition: A | Discharge status: Home / Self Care | Payer: Medicare Other | Source: Ambulatory Visit | Attending: Radiation Oncology | Admitting: Radiation Oncology

## 2015-12-12 DIAGNOSIS — Z510 Encounter for antineoplastic radiation therapy: Secondary | ICD-10-CM | POA: Diagnosis not present

## 2015-12-12 DIAGNOSIS — C50411 Malignant neoplasm of upper-outer quadrant of right female breast: Secondary | ICD-10-CM | POA: Diagnosis not present

## 2015-12-13 ENCOUNTER — Ambulatory Visit
Admission: RE | Admit: 2015-12-13 | Discharge: 2015-12-13 | Disposition: A | Discharge status: Home / Self Care | Payer: Medicare Other | Source: Ambulatory Visit | Attending: Radiation Oncology | Admitting: Radiation Oncology

## 2015-12-13 DIAGNOSIS — C50411 Malignant neoplasm of upper-outer quadrant of right female breast: Secondary | ICD-10-CM | POA: Diagnosis not present

## 2015-12-13 DIAGNOSIS — Z510 Encounter for antineoplastic radiation therapy: Secondary | ICD-10-CM | POA: Diagnosis not present

## 2015-12-14 ENCOUNTER — Ambulatory Visit
Admission: RE | Admit: 2015-12-14 | Discharge: 2015-12-14 | Disposition: A | Discharge status: Home / Self Care | Payer: Medicare Other | Source: Ambulatory Visit | Attending: Radiation Oncology | Admitting: Radiation Oncology

## 2015-12-14 DIAGNOSIS — C50411 Malignant neoplasm of upper-outer quadrant of right female breast: Secondary | ICD-10-CM | POA: Diagnosis not present

## 2015-12-14 DIAGNOSIS — Z510 Encounter for antineoplastic radiation therapy: Secondary | ICD-10-CM | POA: Diagnosis not present

## 2015-12-15 ENCOUNTER — Ambulatory Visit
Admission: RE | Admit: 2015-12-15 | Discharge: 2015-12-15 | Disposition: A | Discharge status: Home / Self Care | Payer: Medicare Other | Source: Ambulatory Visit | Attending: Radiation Oncology | Admitting: Radiation Oncology

## 2015-12-15 ENCOUNTER — Encounter: Payer: Self-pay | Admitting: Radiation Oncology

## 2015-12-15 VITALS — BP 161/80 | HR 78 | Temp 98.3°F | Resp 20 | Wt 246.6 lb

## 2015-12-15 DIAGNOSIS — Z510 Encounter for antineoplastic radiation therapy: Secondary | ICD-10-CM | POA: Diagnosis not present

## 2015-12-15 DIAGNOSIS — C50411 Malignant neoplasm of upper-outer quadrant of right female breast: Secondary | ICD-10-CM

## 2015-12-15 NOTE — Progress Notes (Signed)
Department of Radiation Oncology  Phone:  [phone] Fax:        [phone]  Weekly Treatment Note    Name: Denise [name] Date: 12/15/2015 MRN: [id number] DOB: 29-Jan-1946   Diagnosis:     ICD-9-CM ICD-10-CM   1. Breast cancer of upper-outer quadrant of right female breast ([place]) 174.4 C50.411      Current dose: 24 Gy  Current fraction: 15   MEDICATIONS: Current Outpatient Prescriptions  Medication Sig Dispense Refill  . ADVAIR DISKUS 100-50 MCG/DOSE AEPB Inhale into the lungs.    Marland Kitchen albuterol (PROVENTIL HFA;VENTOLIN HFA) 108 (90 Base) MCG/ACT inhaler Inhale 2 puffs into the lungs every 6 (six) hours as needed for wheezing or shortness of breath. 1 Inhaler 11  . amLODipine (NORVASC) 5 MG tablet TAKE ONE TABLET BY MOUTH ONCE DAILY. 90 tablet 1  . aspirin 81 MG EC tablet Take 81 mg by mouth daily.      Marland Kitchen atorvastatin (LIPITOR) 40 MG tablet TAKE ONE TABLET BY MOUTH DAILY. 90 tablet 3  . budesonide-formoterol (SYMBICORT) 160-4.5 MCG/ACT inhaler Inhale 2 puffs into the lungs 2 (two) times daily. 3 Inhaler 3  . cetirizine (ZYRTEC) 10 MG tablet TAKE ONE TABLET BY MOUTH ONCE DAILY AS NEEDED FOR  ALLERGIES 90 tablet 0  . Cholecalciferol (VITAMIN D3) 1000 UNITS CAPS Take 1 each by mouth daily. 90 capsule 1  . fluticasone (FLONASE) 50 MCG/ACT nasal spray Place 2 sprays into both nostrils daily. 16 g 5  . furosemide (LASIX) 40 MG tablet TAKE ONE TABLET BY MOUTH TWICE DAILY 180 tablet 0  . glimepiride (AMARYL) 2 MG tablet TAKE ONE TABLET BY MOUTH ONCE DAILY BEFORE BREAKFAST 90 tablet 3  . glucose blood (FREESTYLE LITE) test strip Use as instructed once daily 200 each 11  . HYDROcodone-homatropine (HYCODAN) 5-1.5 MG/5ML syrup Take 5 mLs by mouth every 6 (six) hours as needed for cough. 180 mL 0  . levalbuterol (XOPENEX HFA) 45 MCG/ACT inhaler Inhale 2 puffs into the lungs every 6 (six) hours as needed for wheezing or shortness of breath.    . lisinopril (PRINIVIL,ZESTRIL) 40 MG  tablet TAKE ONE TABLET BY MOUTH ONCE DAILY 90 tablet 0  . metFORMIN (GLUCOPHAGE-XR) 500 MG 24 hr tablet 4 tabs mouth in the AM 360 tablet 3  . Multiple Vitamin (MULTIVITAMIN) capsule Take 1 capsule by mouth daily.      . Omega-3 Fatty Acids (FISH OIL) 500 MG CAPS Take 1,000 capsules by mouth daily.     Marland Kitchen oxyCODONE (OXY IR/ROXICODONE) 5 MG immediate release tablet Take 1-2 tablets (5-10 mg total) by mouth every 6 (six) hours as needed for moderate pain, severe pain or breakthrough pain. 30 tablet 0  . potassium chloride (MICRO-K) 10 MEQ CR capsule TAKE FOUR CAPSULES BY MOUTH ONCE DAILY 360 capsule 0   No current facility-administered medications for this encounter.      ALLERGIES: Clonidine hydrochloride; Hydrocodone-acetaminophen; Rosiglitazone maleate; and Erythromycin   LABORATORY DATA:  Lab Results  Component Value Date   WBC 9.8 09/27/2015   HGB 13.8 09/27/2015   HCT 42.7 09/27/2015   MCV 82.9 09/27/2015   PLT 217 09/27/2015   Lab Results  Component Value Date   NA 145 09/27/2015   K 3.7 09/27/2015   CL 105 08/23/2015   CO2 27 09/27/2015   Lab Results  Component Value Date   ALT 28 09/27/2015   AST 22 09/27/2015   ALKPHOS 61 09/27/2015   BILITOT 0.65 09/27/2015  NARRATIVE: Denise [name] was seen today for weekly treatment management. The chart was checked and the patient's films were reviewed.  Weekly rad tx right breast,   Mild tanning, seroma  Less, had 110cc rtemoved by Dr. Excell Seltzer yesterday and will follow with her in 2 weeks, possible j/p drain  Told to her,  She is doing well otherwise Using radiaplex bid, gave 2nd tube  1:53 PM BP (!) 161/80 (BP Location: Left Arm, Patient Position: Sitting, Cuff Size: Large)   Pulse 78   Temp 98.3 F (36.8 C) (Oral)   Resp 20   Wt 246 lb 9.6 oz (111.9 kg)   BMI 46.59 kg/m   Wt Readings from Last 3 Encounters:  12/15/15 246 lb 9.6 oz (111.9 kg)  12/08/15 249 lb 3.2 oz (113 kg)  12/01/15 254 lb 1.6 oz  (115.3 kg)    PHYSICAL EXAMINATION: weight is 246 lb 9.6 oz (111.9 kg). Her oral temperature is 98.3 F (36.8 C). Her blood pressure is 161/80 (abnormal) and her pulse is 78. Her respiration is 20.      Hyperpigmentation present in the treatment area  ASSESSMENT: The patient is doing satisfactorily with treatment.  PLAN: We will continue with the patient's radiation treatment as planned. The patient has had some fluid drawn off as noted above. We will have the patient undergo simulation once again with a CT scan to prepare the patient's boost treatment.

## 2015-12-15 NOTE — Progress Notes (Deleted)
Weekly rad tx right breast,   Mild tanning, seroma  Less, had 110cc rtemoved by Dr. Excell Seltzer yesterday and will follow with her in 2 weeks, possible j/p drain  Told to her,  She is doing well otherwise Using radiaplex bid, gave 2nd tube  9:20 AM BP (!) 161/80 (BP Location: Left Arm, Patient Position: Sitting, Cuff Size: Large)   Pulse 78   Temp 98.3 F (36.8 C) (Oral)   Resp 20   Wt 246 lb 9.6 oz (111.9 kg)   BMI 46.59 kg/m   .BP (!) 161/80 (BP Location: Left Arm, Patient Position: Sitting, Cuff Size: Large)   Pulse 78   Temp 98.3 F (36.8 C) (Oral)   Resp 20   Wt 246 lb 9.6 oz (111.9 kg)   BMI 46.59 kg/m

## 2015-12-15 NOTE — Progress Notes (Signed)
Weekly rad tx right breast,   Mild tanning, seroma  Less, had 110cc rtemoved by Dr. Excell Seltzer yesterday and will follow with her in 2 weeks, possible j/p drain  Told to her,  She is doing well otherwise Using radiaplex bid, gave 2nd tube  9:20 AM BP (!) 161/80 (BP Location: Left Arm, Patient Position: Sitting, Cuff Size: Large)   Pulse 78   Temp 98.3 F (36.8 C) (Oral)   Resp 20   Wt 246 lb 9.6 oz (111.9 kg)   BMI 46.59 kg/m   Wt Readings from Last 3 Encounters:  12/15/15 246 lb 9.6 oz (111.9 kg)  12/08/15 249 lb 3.2 oz (113 kg)  12/01/15 254 lb 1.6 oz (115.3 kg)

## 2015-12-18 ENCOUNTER — Ambulatory Visit
Admission: RE | Admit: 2015-12-18 | Discharge: 2015-12-18 | Disposition: A | Discharge status: Home / Self Care | Payer: Medicare Other | Source: Ambulatory Visit | Attending: Radiation Oncology | Admitting: Radiation Oncology

## 2015-12-18 DIAGNOSIS — Z510 Encounter for antineoplastic radiation therapy: Secondary | ICD-10-CM | POA: Diagnosis not present

## 2015-12-18 DIAGNOSIS — C50411 Malignant neoplasm of upper-outer quadrant of right female breast: Secondary | ICD-10-CM | POA: Diagnosis not present

## 2015-12-19 ENCOUNTER — Ambulatory Visit
Admission: RE | Admit: 2015-12-19 | Discharge: 2015-12-19 | Disposition: A | Discharge status: Home / Self Care | Payer: Medicare Other | Source: Ambulatory Visit | Attending: Radiation Oncology | Admitting: Radiation Oncology

## 2015-12-19 DIAGNOSIS — Z510 Encounter for antineoplastic radiation therapy: Secondary | ICD-10-CM | POA: Diagnosis not present

## 2015-12-19 DIAGNOSIS — C50411 Malignant neoplasm of upper-outer quadrant of right female breast: Secondary | ICD-10-CM | POA: Diagnosis not present

## 2015-12-20 ENCOUNTER — Ambulatory Visit: Payer: Medicare Other | Admitting: Physical Therapy

## 2015-12-20 ENCOUNTER — Ambulatory Visit
Admission: RE | Admit: 2015-12-20 | Discharge: 2015-12-20 | Disposition: A | Discharge status: Home / Self Care | Payer: Medicare Other | Source: Ambulatory Visit | Attending: Radiation Oncology | Admitting: Radiation Oncology

## 2015-12-20 DIAGNOSIS — Z510 Encounter for antineoplastic radiation therapy: Secondary | ICD-10-CM | POA: Diagnosis not present

## 2015-12-20 DIAGNOSIS — C50411 Malignant neoplasm of upper-outer quadrant of right female breast: Secondary | ICD-10-CM | POA: Diagnosis not present

## 2015-12-21 ENCOUNTER — Ambulatory Visit: Payer: Medicare Other | Admitting: Physical Therapy

## 2015-12-21 ENCOUNTER — Encounter: Payer: Self-pay | Admitting: Physical Therapy

## 2015-12-21 ENCOUNTER — Ambulatory Visit
Admission: RE | Admit: 2015-12-21 | Discharge: 2015-12-21 | Disposition: A | Discharge status: Home / Self Care | Payer: Medicare Other | Source: Ambulatory Visit | Attending: Radiation Oncology | Admitting: Radiation Oncology

## 2015-12-21 ENCOUNTER — Other Ambulatory Visit: Payer: Self-pay | Admitting: Internal Medicine

## 2015-12-21 DIAGNOSIS — M25511 Pain in right shoulder: Secondary | ICD-10-CM

## 2015-12-21 DIAGNOSIS — R2231 Localized swelling, mass and lump, right upper limb: Secondary | ICD-10-CM

## 2015-12-21 DIAGNOSIS — Z510 Encounter for antineoplastic radiation therapy: Secondary | ICD-10-CM | POA: Diagnosis not present

## 2015-12-21 DIAGNOSIS — R2232 Localized swelling, mass and lump, left upper limb: Secondary | ICD-10-CM | POA: Diagnosis not present

## 2015-12-21 DIAGNOSIS — C50411 Malignant neoplasm of upper-outer quadrant of right female breast: Secondary | ICD-10-CM | POA: Diagnosis not present
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
## 2015-12-22 ENCOUNTER — Other Ambulatory Visit: Payer: Self-pay | Admitting: Internal Medicine

## 2015-12-22 ENCOUNTER — Ambulatory Visit
Admission: RE | Admit: 2015-12-22 | Discharge: 2015-12-22 | Disposition: A | Discharge status: Home / Self Care | Payer: Medicare Other | Source: Ambulatory Visit | Attending: Radiation Oncology | Admitting: Radiation Oncology

## 2015-12-22 ENCOUNTER — Encounter: Payer: Self-pay | Admitting: Radiation Oncology

## 2015-12-22 VITALS — BP 137/83 | HR 70 | Temp 98.0°F | Ht 61.0 in | Wt 248.9 lb

## 2015-12-22 DIAGNOSIS — C50411 Malignant neoplasm of upper-outer quadrant of right female breast: Secondary | ICD-10-CM | POA: Diagnosis not present

## 2015-12-22 DIAGNOSIS — Z510 Encounter for antineoplastic radiation therapy: Secondary | ICD-10-CM | POA: Diagnosis not present

## 2015-12-22 NOTE — Progress Notes (Signed)
Ms. Roden has received has received 28 fractions to her right breast.  She denies any pain in her breast and continues to have firmness/swelling of the breast.  Fluid aspirated from breast on last Friday, volume of 120 ml.  Skin remains intact right breast and inframmary fold.  Hyperpigmentation present. She reports less fatigue at this time.  BP 137/83 (BP Location: Left Arm, Patient Position: Sitting, Cuff Size: Large)   Pulse 70   Temp 98 F (36.7 C) (Oral)   Ht 5\' 1"  (1.549 m)   Wt 248 lb 14.4 oz (112.9 kg)   BMI 47.03 kg/m

## 2015-12-22 NOTE — Progress Notes (Signed)
Department of Radiation Oncology  Phone:  [phone] Fax:        [phone]  Weekly Treatment Note    Name: Denise [name] Date: 12/24/2015 MRN: [id number] DOB: 02-26-1946   Diagnosis:     ICD-9-CM ICD-10-CM   1. Breast cancer of upper-outer quadrant of right female breast ([place]) 174.4 C50.411      Current dose: 36 Gy  Current fraction: 20   MEDICATIONS: Current Outpatient Prescriptions  Medication Sig Dispense Refill  . ADVAIR DISKUS 100-50 MCG/DOSE AEPB Inhale into the lungs.    Marland Kitchen albuterol (PROVENTIL HFA;VENTOLIN HFA) 108 (90 Base) MCG/ACT inhaler Inhale 2 puffs into the lungs every 6 (six) hours as needed for wheezing or shortness of breath. 1 Inhaler 11  . amLODipine (NORVASC) 5 MG tablet TAKE ONE TABLET BY MOUTH ONCE DAILY. 90 tablet 1  . aspirin 81 MG EC tablet Take 81 mg by mouth daily.      Marland Kitchen atorvastatin (LIPITOR) 40 MG tablet TAKE ONE TABLET BY MOUTH DAILY. 90 tablet 3  . budesonide-formoterol (SYMBICORT) 160-4.5 MCG/ACT inhaler Inhale 2 puffs into the lungs 2 (two) times daily. 3 Inhaler 3  . cetirizine (ZYRTEC) 10 MG tablet TAKE ONE TABLET BY MOUTH ONCE DAILY AS NEEDED FOR  ALLERGIES 90 tablet 0  . Cholecalciferol (VITAMIN D3) 1000 UNITS CAPS Take 1 each by mouth daily. 90 capsule 1  . fluticasone (FLONASE) 50 MCG/ACT nasal spray Place 2 sprays into both nostrils daily. 16 g 5  . furosemide (LASIX) 40 MG tablet TAKE ONE TABLET BY MOUTH TWICE DAILY 180 tablet 0  . glimepiride (AMARYL) 2 MG tablet TAKE ONE TABLET BY MOUTH ONCE DAILY BEFORE BREAKFAST 90 tablet 3  . glucose blood (FREESTYLE LITE) test strip Use as instructed once daily 200 each 11  . HYDROcodone-homatropine (HYCODAN) 5-1.5 MG/5ML syrup Take 5 mLs by mouth every 6 (six) hours as needed for cough. 180 mL 0  . levalbuterol (XOPENEX HFA) 45 MCG/ACT inhaler Inhale 2 puffs into the lungs every 6 (six) hours as needed for wheezing or shortness of breath.    . lisinopril (PRINIVIL,ZESTRIL) 40 MG  tablet TAKE ONE TABLET BY MOUTH ONCE DAILY 90 tablet 0  . metFORMIN (GLUCOPHAGE-XR) 500 MG 24 hr tablet 4 tabs mouth in the AM 360 tablet 3  . Multiple Vitamin (MULTIVITAMIN) capsule Take 1 capsule by mouth daily.      . Omega-3 Fatty Acids (FISH OIL) 500 MG CAPS Take 1,000 capsules by mouth daily.     Marland Kitchen oxyCODONE (OXY IR/ROXICODONE) 5 MG immediate release tablet Take 1-2 tablets (5-10 mg total) by mouth every 6 (six) hours as needed for moderate pain, severe pain or breakthrough pain. 30 tablet 0  . potassium chloride (MICRO-K) 10 MEQ CR capsule TAKE FOUR CAPSULES BY MOUTH ONCE DAILY 360 capsule 0   No current facility-administered medications for this encounter.      ALLERGIES: Clonidine hydrochloride; Hydrocodone-acetaminophen; Rosiglitazone maleate; and Erythromycin   LABORATORY DATA:  Lab Results  Component Value Date   WBC 9.8 09/27/2015   HGB 13.8 09/27/2015   HCT 42.7 09/27/2015   MCV 82.9 09/27/2015   PLT 217 09/27/2015   Lab Results  Component Value Date   NA 145 09/27/2015   K 3.7 09/27/2015   CL 105 08/23/2015   CO2 27 09/27/2015   Lab Results  Component Value Date   ALT 28 09/27/2015   AST 22 09/27/2015   ALKPHOS 61 09/27/2015   BILITOT 0.65 09/27/2015  NARRATIVE: Nalanie Sloma was seen today for weekly treatment management. The chart was checked and the patient's films were reviewed.  She denies any pain in her right breast and continues to have firmness/swelling of the breast.  Fluid aspirated from breast last Friday, volume of 120 ml.  Skin remains intact right breast and inframmary fold. Hyperpigmentation present. She reports less fatigue at this time.  7:53 PM BP 137/83 (BP Location: Left Arm, Patient Position: Sitting, Cuff Size: Large)   Pulse 70   Temp 98 F (36.7 C) (Oral)   Ht 5\' 1"  (1.549 m)   Wt 248 lb 14.4 oz (112.9 kg)   BMI 47.03 kg/m   Wt Readings from Last 3 Encounters:  12/22/15 248 lb 14.4 oz (112.9 kg)  12/15/15 246 lb  9.6 oz (111.9 kg)  12/08/15 249 lb 3.2 oz (113 kg)    PHYSICAL EXAMINATION: height is 5\' 1"  (1.549 m) and weight is 248 lb 14.4 oz (112.9 kg). Her oral temperature is 98 F (36.7 C). Her blood pressure is 137/83 and her pulse is 70.   ASSESSMENT: The patient is doing satisfactorily with treatment.  PLAN: We will continue with the patient's radiation treatment as planned.  This document serves as a record of services personally performed by Kyung Rudd, MD. It was created on his behalf by Darcus Austin, a trained medical scribe. The creation of this record is based on the scribe's personal observations and the provider's statements to them. This document has been checked and approved by the attending provider.

## 2015-12-25 ENCOUNTER — Ambulatory Visit
Admission: RE | Admit: 2015-12-25 | Discharge: 2015-12-25 | Disposition: A | Discharge status: Home / Self Care | Payer: Medicare Other | Source: Ambulatory Visit | Attending: Radiation Oncology | Admitting: Radiation Oncology

## 2015-12-25 DIAGNOSIS — M6281 Muscle weakness (generalized): Secondary | ICD-10-CM | POA: Diagnosis not present

## 2015-12-25 DIAGNOSIS — M79605 Pain in left leg: Secondary | ICD-10-CM | POA: Diagnosis not present

## 2015-12-25 DIAGNOSIS — M79604 Pain in right leg: Secondary | ICD-10-CM | POA: Diagnosis not present

## 2015-12-25 DIAGNOSIS — C50411 Malignant neoplasm of upper-outer quadrant of right female breast: Secondary | ICD-10-CM | POA: Diagnosis not present

## 2015-12-25 DIAGNOSIS — M545 Low back pain: Secondary | ICD-10-CM | POA: Diagnosis not present

## 2015-12-25 DIAGNOSIS — Z510 Encounter for antineoplastic radiation therapy: Secondary | ICD-10-CM | POA: Diagnosis not present

## 2015-12-26 ENCOUNTER — Ambulatory Visit
Admission: RE | Admit: 2015-12-26 | Discharge: 2015-12-26 | Disposition: A | Discharge status: Home / Self Care | Payer: Medicare Other | Source: Ambulatory Visit | Attending: Radiation Oncology | Admitting: Radiation Oncology

## 2015-12-26 ENCOUNTER — Other Ambulatory Visit: Payer: Self-pay | Admitting: Internal Medicine

## 2015-12-26 DIAGNOSIS — Z7982 Long term (current) use of aspirin: Secondary | ICD-10-CM | POA: Insufficient documentation

## 2015-12-26 DIAGNOSIS — Z510 Encounter for antineoplastic radiation therapy: Secondary | ICD-10-CM | POA: Diagnosis not present

## 2015-12-26 DIAGNOSIS — Z79899 Other long term (current) drug therapy: Secondary | ICD-10-CM | POA: Insufficient documentation

## 2015-12-26 DIAGNOSIS — Z7984 Long term (current) use of oral hypoglycemic drugs: Secondary | ICD-10-CM | POA: Insufficient documentation

## 2015-12-26 DIAGNOSIS — C50411 Malignant neoplasm of upper-outer quadrant of right female breast: Secondary | ICD-10-CM | POA: Insufficient documentation

## 2015-12-27 ENCOUNTER — Ambulatory Visit (INDEPENDENT_AMBULATORY_CARE_PROVIDER_SITE_OTHER): Payer: Medicare Other | Admitting: Internal Medicine

## 2015-12-27 ENCOUNTER — Ambulatory Visit
Admission: RE | Admit: 2015-12-27 | Discharge: 2015-12-27 | Disposition: A | Discharge status: Home / Self Care | Payer: Medicare Other | Source: Ambulatory Visit | Attending: Radiation Oncology | Admitting: Radiation Oncology

## 2015-12-27 ENCOUNTER — Encounter: Payer: Self-pay | Admitting: Internal Medicine

## 2015-12-27 VITALS — BP 138/78 | HR 93 | Temp 98.3°F | Wt 248.0 lb

## 2015-12-27 DIAGNOSIS — M545 Low back pain, unspecified: Secondary | ICD-10-CM

## 2015-12-27 DIAGNOSIS — I10 Essential (primary) hypertension: Secondary | ICD-10-CM

## 2015-12-27 DIAGNOSIS — Z7984 Long term (current) use of oral hypoglycemic drugs: Secondary | ICD-10-CM | POA: Diagnosis not present

## 2015-12-27 DIAGNOSIS — Z510 Encounter for antineoplastic radiation therapy: Secondary | ICD-10-CM | POA: Diagnosis not present

## 2015-12-27 DIAGNOSIS — E785 Hyperlipidemia, unspecified: Secondary | ICD-10-CM

## 2015-12-27 DIAGNOSIS — C50411 Malignant neoplasm of upper-outer quadrant of right female breast: Secondary | ICD-10-CM | POA: Diagnosis not present

## 2015-12-27 DIAGNOSIS — M542 Cervicalgia: Secondary | ICD-10-CM | POA: Diagnosis not present

## 2015-12-27 DIAGNOSIS — M6281 Muscle weakness (generalized): Secondary | ICD-10-CM | POA: Diagnosis not present

## 2015-12-27 DIAGNOSIS — Z7982 Long term (current) use of aspirin: Secondary | ICD-10-CM | POA: Diagnosis not present

## 2015-12-27 DIAGNOSIS — M79605 Pain in left leg: Secondary | ICD-10-CM | POA: Diagnosis not present

## 2015-12-27 DIAGNOSIS — M79604 Pain in right leg: Secondary | ICD-10-CM | POA: Diagnosis not present

## 2015-12-27 DIAGNOSIS — Z79899 Other long term (current) drug therapy: Secondary | ICD-10-CM | POA: Diagnosis not present

## 2015-12-27 DIAGNOSIS — E119 Type 2 diabetes mellitus without complications: Secondary | ICD-10-CM

## 2015-12-27 MED ORDER — TIZANIDINE HCL 4 MG PO TABS: 4.0000 mg | ORAL_TABLET | Freq: Four times a day (QID) | ORAL | 1 refills | Status: DC | PRN

## 2015-12-27 NOTE — Assessment & Plan Note (Signed)
stable overall by history and exam, recent data reviewed with pt, and pt to continue medical treatment as before,  to f/u any worsening symptoms or concerns BP Readings from Last 3 Encounters:  12/27/15 138/78  12/22/15 137/83  12/15/15 (!) 161/80

## 2015-12-27 NOTE — Assessment & Plan Note (Signed)
stable overall by history and exam, recent data reviewed with pt, and pt to continue medical treatment as before,  to f/u any worsening symptoms or concerns Lab Results  Component Value Date   LDLCALC 74 08/23/2015

## 2015-12-27 NOTE — Patient Instructions (Signed)
Please take all new medication as prescribed - the muscle relaxer  Please continue all other medications as before, and refills have been done if requested.  Please have the pharmacy call with any other refills you may need.  Please continue your efforts at being more active, low cholesterol diet, and weight control.  You are otherwise up to date with prevention measures today.  Please keep your appointments with your specialists as you may have planned  Please go to the LAB in the Basement (turn left off the elevator) for the tests to be done today  You will be contacted by phone if any changes need to be made immediately.  Otherwise, you will receive a letter about your results with an explanation, but please check with MyChart first.  Please remember to sign up for MyChart if you have not done so, as this will be important to you in the future with finding out test results, communicating by private email, and scheduling acute appointments online when needed.  Please return in 6 months, or sooner if needed
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
## 2015-12-27 NOTE — Progress Notes (Signed)
Pre visit review using our clinic review tool, if applicable. No additional management support is needed unless otherwise documented below in the visit note. 

## 2015-12-27 NOTE — Assessment & Plan Note (Addendum)
stable overall by history and exam, recent data reviewed with pt, and pt to continue medical treatment as before,  to f/u any worsening symptoms or concerns Lab Results  Component Value Date   HGBA1C 6.9 (H) 08/23/2015   Note:  Total time for pt hx, exam, review of record with pt in the room, determination of diagnoses and plan for further eval and tx is > 40 min, with over 50% spent in coordination and counseling of patient

## 2015-12-27 NOTE — Assessment & Plan Note (Signed)
By exam more c/w msk strain, for muscle relaxer prn,  to f/u any worsening symptoms or concerns
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
## 2015-12-28 ENCOUNTER — Ambulatory Visit (INDEPENDENT_AMBULATORY_CARE_PROVIDER_SITE_OTHER)
Admission: RE | Admit: 2015-12-28 | Discharge: 2015-12-28 | Disposition: A | Discharge status: Home / Self Care | Payer: Medicare Other | Source: Ambulatory Visit | Attending: Internal Medicine | Admitting: Internal Medicine

## 2015-12-28 ENCOUNTER — Other Ambulatory Visit (INDEPENDENT_AMBULATORY_CARE_PROVIDER_SITE_OTHER): Payer: Medicare Other

## 2015-12-28 ENCOUNTER — Ambulatory Visit
Admission: RE | Admit: 2015-12-28 | Discharge: 2015-12-28 | Disposition: A | Discharge status: Home / Self Care | Payer: Medicare Other | Source: Ambulatory Visit | Attending: Radiation Oncology | Admitting: Radiation Oncology

## 2015-12-28 DIAGNOSIS — E119 Type 2 diabetes mellitus without complications: Secondary | ICD-10-CM | POA: Diagnosis not present

## 2015-12-28 DIAGNOSIS — M545 Low back pain, unspecified: Secondary | ICD-10-CM

## 2015-12-28 DIAGNOSIS — Z510 Encounter for antineoplastic radiation therapy: Secondary | ICD-10-CM | POA: Diagnosis not present

## 2015-12-28 DIAGNOSIS — S3992XA Unspecified injury of lower back, initial encounter: Secondary | ICD-10-CM | POA: Diagnosis not present

## 2015-12-28 DIAGNOSIS — S199XXA Unspecified injury of neck, initial encounter: Secondary | ICD-10-CM | POA: Diagnosis not present

## 2015-12-28 DIAGNOSIS — M542 Cervicalgia: Secondary | ICD-10-CM

## 2015-12-28 DIAGNOSIS — E785 Hyperlipidemia, unspecified: Secondary | ICD-10-CM | POA: Diagnosis not present

## 2015-12-28 DIAGNOSIS — Z79899 Other long term (current) drug therapy: Secondary | ICD-10-CM | POA: Diagnosis not present

## 2015-12-28 DIAGNOSIS — C50411 Malignant neoplasm of upper-outer quadrant of right female breast: Secondary | ICD-10-CM | POA: Diagnosis not present

## 2015-12-28 DIAGNOSIS — Z7982 Long term (current) use of aspirin: Secondary | ICD-10-CM | POA: Diagnosis not present

## 2015-12-28 DIAGNOSIS — Z7984 Long term (current) use of oral hypoglycemic drugs: Secondary | ICD-10-CM | POA: Diagnosis not present

## 2015-12-28 LAB — CBC WITH DIFFERENTIAL/PLATELET
BASOS PCT: 0.9 % (ref 0.0–3.0)
Basophils Absolute: 0.1 10*3/uL (ref 0.0–0.1)
EOS ABS: 0.2 10*3/uL (ref 0.0–0.7)
Eosinophils Relative: 2.1 % (ref 0.0–5.0)
HCT: 41.8 % (ref 36.0–46.0)
Hemoglobin: 13.8 g/dL (ref 12.0–15.0)
LYMPHS ABS: 2.2 10*3/uL (ref 0.7–4.0)
Lymphocytes Relative: 23.5 % (ref 12.0–46.0)
MCHC: 32.9 g/dL (ref 30.0–36.0)
MCV: 81.2 fl (ref 78.0–100.0)
MONO ABS: 0.7 10*3/uL (ref 0.1–1.0)
Monocytes Relative: 7.3 % (ref 3.0–12.0)
NEUTROS ABS: 6.1 10*3/uL (ref 1.4–7.7)
Neutrophils Relative %: 66.2 % (ref 43.0–77.0)
PLATELETS: 219 10*3/uL (ref 150.0–400.0)
RBC: 5.15 Mil/uL — ABNORMAL HIGH (ref 3.87–5.11)
RDW: 15.9 % — AB (ref 11.5–15.5)
WBC: 9.2 10*3/uL (ref 4.0–10.5)

## 2015-12-28 LAB — URINALYSIS, ROUTINE W REFLEX MICROSCOPIC
Bilirubin Urine: NEGATIVE
Hgb urine dipstick: NEGATIVE
Ketones, ur: NEGATIVE
Nitrite: NEGATIVE
SPECIFIC GRAVITY, URINE: 1.025 (ref 1.000–1.030)
TOTAL PROTEIN, URINE-UPE24: NEGATIVE
URINE GLUCOSE: NEGATIVE
Urobilinogen, UA: 0.2 (ref 0.0–1.0)
pH: 6 (ref 5.0–8.0)

## 2015-12-28 LAB — LIPID PANEL
CHOL/HDL RATIO: 3
Cholesterol: 182 mg/dL (ref 0–200)
HDL: 70.2 mg/dL (ref >39.00)
LDL Cholesterol: 91 mg/dL (ref 0–99)
NONHDL: 111.86
TRIGLYCERIDES: 103 mg/dL (ref 0.0–149.0)
VLDL: 20.6 mg/dL (ref 0.0–40.0)

## 2015-12-28 LAB — HEPATIC FUNCTION PANEL
ALK PHOS: 60 U/L (ref 39–117)
ALT: 23 U/L (ref 0–35)
AST: 20 U/L (ref 0–37)
Albumin: 4.3 g/dL (ref 3.5–5.2)
BILIRUBIN DIRECT: 0.1 mg/dL (ref 0.0–0.3)
BILIRUBIN TOTAL: 0.7 mg/dL (ref 0.2–1.2)
Total Protein: 7.5 g/dL (ref 6.0–8.3)

## 2015-12-28 LAB — BASIC METABOLIC PANEL
BUN: 11 mg/dL (ref 6–23)
CHLORIDE: 103 meq/L (ref 96–112)
CO2: 32 mEq/L (ref 19–32)
Calcium: 9.4 mg/dL (ref 8.4–10.5)
Creatinine, Ser: 0.79 mg/dL (ref 0.40–1.20)
GFR: 92.41 mL/min (ref >60.00)
Glucose, Bld: 105 mg/dL — ABNORMAL HIGH (ref 70–99)
POTASSIUM: 3.7 meq/L (ref 3.5–5.1)
Sodium: 141 mEq/L (ref 135–145)

## 2015-12-28 LAB — MICROALBUMIN / CREATININE URINE RATIO
CREATININE, U: 211.7 mg/dL
MICROALB/CREAT RATIO: 1.5 mg/g (ref 0.0–30.0)
Microalb, Ur: 3.1 mg/dL — ABNORMAL HIGH (ref 0.0–1.9)

## 2015-12-28 LAB — HEMOGLOBIN A1C: Hgb A1c MFr Bld: 6.4 % (ref 4.6–6.5)

## 2015-12-28 LAB — TSH: TSH: 1.09 u[IU]/mL (ref 0.35–4.50)

## 2015-12-29 ENCOUNTER — Encounter: Payer: Self-pay | Admitting: Radiation Oncology

## 2015-12-29 ENCOUNTER — Ambulatory Visit
Admission: RE | Admit: 2015-12-29 | Discharge: 2015-12-29 | Disposition: A | Discharge status: Home / Self Care | Payer: Medicare Other | Source: Ambulatory Visit | Attending: Radiation Oncology | Admitting: Radiation Oncology

## 2015-12-29 VITALS — BP 142/77 | HR 74 | Temp 98.3°F | Resp 20 | Wt 250.9 lb

## 2015-12-29 DIAGNOSIS — C50411 Malignant neoplasm of upper-outer quadrant of right female breast: Secondary | ICD-10-CM

## 2015-12-29 DIAGNOSIS — Z79899 Other long term (current) drug therapy: Secondary | ICD-10-CM | POA: Diagnosis not present

## 2015-12-29 DIAGNOSIS — Z7984 Long term (current) use of oral hypoglycemic drugs: Secondary | ICD-10-CM | POA: Diagnosis not present

## 2015-12-29 DIAGNOSIS — Z510 Encounter for antineoplastic radiation therapy: Secondary | ICD-10-CM | POA: Diagnosis not present

## 2015-12-29 DIAGNOSIS — Z7982 Long term (current) use of aspirin: Secondary | ICD-10-CM | POA: Diagnosis not present

## 2015-12-29 MED ORDER — RADIAPLEXRX EX GEL
Freq: Once | CUTANEOUS | Status: AC
  Administered 2015-12-29: 10:00:00 via TOPICAL

## 2015-12-29 NOTE — Progress Notes (Signed)
Weekly rad txs right breast, erythema, hyperpigmentation, skin intact,using   radaiplex bid,  felt seroma again, seeing Dr. Excell Seltzer todayat 1045 am, may be drained again, feeling fatigued, appetite fair 9:08 AM Wt Readings from Last 3 Encounters:  12/27/15 248 lb (112.5 kg)  12/22/15 248 lb 14.4 oz (112.9 kg)  12/15/15 246 lb 9.6 oz (111.9 kg)

## 2015-12-29 NOTE — Progress Notes (Signed)
Department of Radiation Oncology  Phone:  [phone] Fax:        [phone]  Weekly Treatment Note    Name: Denise [name] Date: 12/29/2015 MRN: [id number] DOB: 1945/09/17   Diagnosis:     ICD-9-CM ICD-10-CM   1. Breast cancer of upper-outer quadrant of right female breast (HCC) 174.4 C50.411 hyaluronate sodium (RADIAPLEXRX) gel     Current dose: 45 Gy  Current fraction:25   MEDICATIONS: Current Outpatient Prescriptions  Medication Sig Dispense Refill  . ADVAIR DISKUS 100-50 MCG/DOSE AEPB Inhale into the lungs.    Marland Kitchen albuterol (PROVENTIL HFA;VENTOLIN HFA) 108 (90 Base) MCG/ACT inhaler Inhale 2 puffs into the lungs every 6 (six) hours as needed for wheezing or shortness of breath. 1 Inhaler 11  . amLODipine (NORVASC) 5 MG tablet TAKE ONE TABLET BY MOUTH ONCE DAILY. 90 tablet 1  . aspirin 81 MG EC tablet Take 81 mg by mouth daily.      Marland Kitchen atorvastatin (LIPITOR) 40 MG tablet TAKE ONE TABLET BY MOUTH DAILY. 90 tablet 3  . budesonide-formoterol (SYMBICORT) 160-4.5 MCG/ACT inhaler Inhale 2 puffs into the lungs 2 (two) times daily. 3 Inhaler 3  . cetirizine (ZYRTEC) 10 MG tablet TAKE ONE TABLET BY MOUTH ONCE DAILY AS NEEDED FOR ALLERGY 90 tablet 0  . Cholecalciferol (VITAMIN D3) 1000 UNITS CAPS Take 1 each by mouth daily. 90 capsule 1  . fluticasone (FLONASE) 50 MCG/ACT nasal spray Place 2 sprays into both nostrils daily. 16 g 5  . furosemide (LASIX) 40 MG tablet TAKE ONE TABLET BY MOUTH TWICE DAILY 180 tablet 0  . glimepiride (AMARYL) 2 MG tablet TAKE ONE TABLET BY MOUTH ONCE DAILY BEFORE  BREAKFAST. 90 tablet 3  . glucose blood (FREESTYLE LITE) test strip Use as instructed once daily 200 each 11  . HYDROcodone-homatropine (HYCODAN) 5-1.5 MG/5ML syrup Take 5 mLs by mouth every 6 (six) hours as needed for cough. 180 mL 0  . levalbuterol (XOPENEX HFA) 45 MCG/ACT inhaler Inhale 2 puffs into the lungs every 6 (six) hours as needed for wheezing or shortness of breath.    .  lisinopril (PRINIVIL,ZESTRIL) 40 MG tablet TAKE ONE TABLET BY MOUTH ONCE DAILY 90 tablet 0  . metFORMIN (GLUCOPHAGE-XR) 500 MG 24 hr tablet 4 tabs mouth in the AM 360 tablet 3  . Multiple Vitamin (MULTIVITAMIN) capsule Take 1 capsule by mouth daily.      . Omega-3 Fatty Acids (FISH OIL) 500 MG CAPS Take 1,000 capsules by mouth daily.     Marland Kitchen oxyCODONE (OXY IR/ROXICODONE) 5 MG immediate release tablet Take 1-2 tablets (5-10 mg total) by mouth every 6 (six) hours as needed for moderate pain, severe pain or breakthrough pain. 30 tablet 0  . potassium chloride (MICRO-K) 10 MEQ CR capsule TAKE FOUR CAPSULES BY MOUTH ONCE DAILY 360 capsule 0  . tiZANidine (ZANAFLEX) 4 MG tablet Take 1 tablet (4 mg total) by mouth every 6 (six) hours as needed for muscle spasms. 60 tablet 1   No current facility-administered medications for this encounter.      ALLERGIES: Clonidine hydrochloride; Hydrocodone-acetaminophen; Rosiglitazone maleate; and Erythromycin   LABORATORY DATA:  Lab Results  Component Value Date   WBC 9.2 12/28/2015   HGB 13.8 12/28/2015   HCT 41.8 12/28/2015   MCV 81.2 12/28/2015   PLT 219.0 12/28/2015   Lab Results  Component Value Date   NA 141 12/28/2015   K 3.7 12/28/2015   CL 103 12/28/2015   CO2 32 12/28/2015   Lab  Results  Component Value Date   ALT 23 12/28/2015   AST 20 12/28/2015   ALKPHOS 60 12/28/2015   BILITOT 0.7 12/28/2015     NARRATIVE: Denise [name] was seen today for weekly treatment management. The chart was checked and the patient's films were reviewed.  Weekly rad txs right breast, erythema, hyperpigmentation, skin intact,using   radaiplex bid,  felt seroma again, seeing Dr. Excell Seltzer todayat 1045 am, may be drained again, feeling fatigued, appetite fair 9:43 AM Wt Readings from Last 3 Encounters:  12/29/15 250 lb 14.4 oz (113.8 kg)  12/27/15 248 lb (112.5 kg)  12/22/15 248 lb 14.4 oz (112.9 kg)    PHYSICAL EXAMINATION: weight is 250 lb 14.4 oz  (113.8 kg). Her oral temperature is 98.3 F (36.8 C). Her blood pressure is 142/77 (abnormal) and her pulse is 74. Her respiration is 20.      The patient's skin shows hyperpigmentation. No unexpected erythema, no sign of infection. No moist desquamation.  ASSESSMENT: The patient is doing satisfactorily with treatment.  PLAN: We will continue with the patient's radiation treatment as planned. She sees surgery she states this morning. I discussed with her that I am hopeful that it will not be necessary to drain the seroma cavity. If this is the case, we discussed that we would need to replan her treatment and we would need to undergo simulation as soon as possible next week. Hopefully any intervention can be held off until we finish treatment.

## 2016-01-02 ENCOUNTER — Ambulatory Visit
Admission: RE | Admit: 2016-01-02 | Discharge: 2016-01-02 | Disposition: A | Discharge status: Home / Self Care | Payer: Medicare Other | Source: Ambulatory Visit | Attending: Radiation Oncology | Admitting: Radiation Oncology

## 2016-01-02 DIAGNOSIS — Z7982 Long term (current) use of aspirin: Secondary | ICD-10-CM | POA: Diagnosis not present

## 2016-01-02 DIAGNOSIS — Z510 Encounter for antineoplastic radiation therapy: Secondary | ICD-10-CM | POA: Diagnosis not present

## 2016-01-02 DIAGNOSIS — Z7984 Long term (current) use of oral hypoglycemic drugs: Secondary | ICD-10-CM | POA: Diagnosis not present

## 2016-01-02 DIAGNOSIS — C50411 Malignant neoplasm of upper-outer quadrant of right female breast: Secondary | ICD-10-CM | POA: Diagnosis not present

## 2016-01-02 DIAGNOSIS — Z79899 Other long term (current) drug therapy: Secondary | ICD-10-CM | POA: Diagnosis not present

## 2016-01-03 ENCOUNTER — Ambulatory Visit
Admission: RE | Admit: 2016-01-03 | Discharge: 2016-01-03 | Disposition: A | Discharge status: Home / Self Care | Payer: Medicare Other | Source: Ambulatory Visit | Attending: Radiation Oncology | Admitting: Radiation Oncology

## 2016-01-03 ENCOUNTER — Encounter: Payer: Self-pay | Admitting: Radiation Oncology

## 2016-01-03 VITALS — BP 147/82 | HR 72 | Temp 98.5°F | Resp 18 | Ht 61.0 in | Wt 245.2 lb

## 2016-01-03 DIAGNOSIS — M545 Low back pain: Secondary | ICD-10-CM | POA: Diagnosis not present

## 2016-01-03 DIAGNOSIS — Z510 Encounter for antineoplastic radiation therapy: Secondary | ICD-10-CM | POA: Diagnosis not present

## 2016-01-03 DIAGNOSIS — M79605 Pain in left leg: Secondary | ICD-10-CM | POA: Diagnosis not present

## 2016-01-03 DIAGNOSIS — Z7984 Long term (current) use of oral hypoglycemic drugs: Secondary | ICD-10-CM | POA: Diagnosis not present

## 2016-01-03 DIAGNOSIS — Z79899 Other long term (current) drug therapy: Secondary | ICD-10-CM | POA: Diagnosis not present

## 2016-01-03 DIAGNOSIS — C50411 Malignant neoplasm of upper-outer quadrant of right female breast: Secondary | ICD-10-CM | POA: Diagnosis not present

## 2016-01-03 DIAGNOSIS — M79604 Pain in right leg: Secondary | ICD-10-CM | POA: Diagnosis not present

## 2016-01-03 DIAGNOSIS — M6281 Muscle weakness (generalized): Secondary | ICD-10-CM | POA: Diagnosis not present

## 2016-01-03 DIAGNOSIS — Z7982 Long term (current) use of aspirin: Secondary | ICD-10-CM | POA: Diagnosis not present

## 2016-01-03 NOTE — Progress Notes (Signed)
.   Department of Radiation Oncology  Phone:  [phone] Fax:        [phone]  Weekly Treatment Note    Name: Denise [name] Date: 01/03/2016 MRN: [id number] DOB: Aug 27, 1945   Diagnosis:     ICD-9-CM ICD-10-CM   1. Breast cancer of upper-outer quadrant of right female breast ([place]) 174.4 C50.411      Current dose: 48.6 Gy  Current fraction: 27   MEDICATIONS: Current Outpatient Prescriptions  Medication Sig Dispense Refill  . ADVAIR DISKUS 100-50 MCG/DOSE AEPB Inhale into the lungs.    Marland Kitchen albuterol (PROVENTIL HFA;VENTOLIN HFA) 108 (90 Base) MCG/ACT inhaler Inhale 2 puffs into the lungs every 6 (six) hours as needed for wheezing or shortness of breath. 1 Inhaler 11  . amLODipine (NORVASC) 5 MG tablet TAKE ONE TABLET BY MOUTH ONCE DAILY. 90 tablet 1  . aspirin 81 MG EC tablet Take 81 mg by mouth daily.      Marland Kitchen atorvastatin (LIPITOR) 40 MG tablet TAKE ONE TABLET BY MOUTH DAILY. 90 tablet 3  . budesonide-formoterol (SYMBICORT) 160-4.5 MCG/ACT inhaler Inhale 2 puffs into the lungs 2 (two) times daily. 3 Inhaler 3  . cetirizine (ZYRTEC) 10 MG tablet TAKE ONE TABLET BY MOUTH ONCE DAILY AS NEEDED FOR ALLERGY 90 tablet 0  . Cholecalciferol (VITAMIN D3) 1000 UNITS CAPS Take 1 each by mouth daily. 90 capsule 1  . fluticasone (FLONASE) 50 MCG/ACT nasal spray Place 2 sprays into both nostrils daily. 16 g 5  . furosemide (LASIX) 40 MG tablet TAKE ONE TABLET BY MOUTH TWICE DAILY 180 tablet 0  . glimepiride (AMARYL) 2 MG tablet TAKE ONE TABLET BY MOUTH ONCE DAILY BEFORE  BREAKFAST. 90 tablet 3  . glucose blood (FREESTYLE LITE) test strip Use as instructed once daily 200 each 11  . HYDROcodone-homatropine (HYCODAN) 5-1.5 MG/5ML syrup Take 5 mLs by mouth every 6 (six) hours as needed for cough. 180 mL 0  . levalbuterol (XOPENEX HFA) 45 MCG/ACT inhaler Inhale 2 puffs into the lungs every 6 (six) hours as needed for wheezing or shortness of breath.    . lisinopril (PRINIVIL,ZESTRIL) 40  MG tablet TAKE ONE TABLET BY MOUTH ONCE DAILY 90 tablet 0  . metFORMIN (GLUCOPHAGE-XR) 500 MG 24 hr tablet 4 tabs mouth in the AM 360 tablet 3  . Multiple Vitamin (MULTIVITAMIN) capsule Take 1 capsule by mouth daily.      . Omega-3 Fatty Acids (FISH OIL) 500 MG CAPS Take 1,000 capsules by mouth daily.     Marland Kitchen oxyCODONE (OXY IR/ROXICODONE) 5 MG immediate release tablet Take 1-2 tablets (5-10 mg total) by mouth every 6 (six) hours as needed for moderate pain, severe pain or breakthrough pain. 30 tablet 0  . potassium chloride (MICRO-K) 10 MEQ CR capsule TAKE FOUR CAPSULES BY MOUTH ONCE DAILY 360 capsule 0  . tiZANidine (ZANAFLEX) 4 MG tablet Take 1 tablet (4 mg total) by mouth every 6 (six) hours as needed for muscle spasms. 60 tablet 1   No current facility-administered medications for this encounter.      ALLERGIES: Clonidine hydrochloride; Hydrocodone-acetaminophen; Rosiglitazone maleate; and Erythromycin   LABORATORY DATA:  Lab Results  Component Value Date   WBC 9.2 12/28/2015   HGB 13.8 12/28/2015   HCT 41.8 12/28/2015   MCV 81.2 12/28/2015   PLT 219.0 12/28/2015   Lab Results  Component Value Date   NA 141 12/28/2015   K 3.7 12/28/2015   CL 103 12/28/2015   CO2 32 12/28/2015   Lab  Results  Component Value Date   ALT 23 12/28/2015   AST 20 12/28/2015   ALKPHOS 60 12/28/2015   BILITOT 0.7 12/28/2015     NARRATIVE: Denise [name] was seen today for weekly treatment management. The chart was checked and the patient's films were reviewed.  Denise [name] has received 27 fractions to her right breast.  Skin to right breast with skin breakage around the size of a nickel under the breast when she removed bandage after having fluid removed on Friday 12-29-15, right breast with moist desquamation, using Radiaplex gel bid.  Appetite is good.  Having fatigue most of the day.  Denies pain. Wt Readings from Last 3 Encounters:  01/03/16 245 lb 3.2 oz (111.2 kg)  12/29/15 250 lb  14.4 oz (113.8 kg)  12/27/15 248 lb (112.5 kg)  BP (!) 147/82 (BP Location: Left Arm, Patient Position: Sitting, Cuff Size: Large)   Pulse 72   Temp 98.5 F (36.9 C) (Oral)   Resp 18   Ht 5\' 1"  (1.549 m)   Wt 245 lb 3.2 oz (111.2 kg)   SpO2 97%   BMI 46.33 kg/m   PHYSICAL EXAMINATION: height is 5\' 1"  (1.549 m) and weight is 245 lb 3.2 oz (111.2 kg). Her oral temperature is 98.5 F (36.9 C). Her blood pressure is 147/82 (abnormal) and her pulse is 72. Her respiration is 18 and oxygen saturation is 97%.      Hyperpigmentation present in the treatment area, greater in the right axilla. Small area of skin was removed with the patient removed a bandage laterally in the inferior right breast. No moist desquamation.  ASSESSMENT: The patient is doing satisfactorily with treatment.  PLAN: We will continue with the patient's radiation treatment as planned. The patient will finish treatment next week.

## 2016-01-03 NOTE — Progress Notes (Signed)
Denise [name] has received 27 fractions to her right breast.  Skin to right breast with skin breakage around the size of a nickel under the breast when she removed bandage after having fluid removed on Friday 12-29-15, right breast with moist desquamation, using Radiaplex gel bid.  Appetite is good.  Having fatigue most of the day.  Denies pain. Wt Readings from Last 3 Encounters:  01/03/16 245 lb 3.2 oz (111.2 kg)  12/29/15 250 lb 14.4 oz (113.8 kg)  12/27/15 248 lb (112.5 kg)  BP (!) 147/82 (BP Location: Left Arm, Patient Position: Sitting, Cuff Size: Large)   Pulse 72   Temp 98.5 F (36.9 C) (Oral)   Resp 18   Ht 5\' 1"  (1.549 m)   Wt 245 lb 3.2 oz (111.2 kg)   SpO2 97%   BMI 46.33 kg/m

## 2016-01-03 NOTE — Progress Notes (Signed)
Complex simulation note  Diagnosis: right-sided breast cancer  Narrative The patient has initially been planned to receive a course of whole breast radiation to a dose of 50.4 Gy in 28 fractions. The patient will now receive an additional boost to the seroma cavity which has been contoured. The patient has had fluid drawn off the right breast from the seroma cavity and therefore requires re-simulation to plan for the patient's boost treatment.This will correspond to a boost of 14 Gy at 2 Gy per fraction. To accomplish this, an additional 3 customized blocks have been designed for this purpose. A complex isodose plan is requested to ensure that the target area is adequately covered with radiation dose and that the nearby normal structures such as the lung are adequately spared. The patient's final total dose will be 64.4 Gy.  ------------------------------------------------  Denise Gross, MD, PhD

## 2016-01-04 ENCOUNTER — Ambulatory Visit
Admission: RE | Admit: 2016-01-04 | Discharge: 2016-01-04 | Disposition: A | Discharge status: Home / Self Care | Payer: Medicare Other | Source: Ambulatory Visit | Attending: Radiation Oncology | Admitting: Radiation Oncology

## 2016-01-04 ENCOUNTER — Other Ambulatory Visit: Payer: Self-pay | Admitting: Internal Medicine

## 2016-01-04 DIAGNOSIS — Z7984 Long term (current) use of oral hypoglycemic drugs: Secondary | ICD-10-CM | POA: Diagnosis not present

## 2016-01-04 DIAGNOSIS — C50411 Malignant neoplasm of upper-outer quadrant of right female breast: Secondary | ICD-10-CM | POA: Diagnosis not present

## 2016-01-04 DIAGNOSIS — Z510 Encounter for antineoplastic radiation therapy: Secondary | ICD-10-CM | POA: Diagnosis not present

## 2016-01-04 DIAGNOSIS — Z79899 Other long term (current) drug therapy: Secondary | ICD-10-CM | POA: Diagnosis not present

## 2016-01-04 DIAGNOSIS — Z7982 Long term (current) use of aspirin: Secondary | ICD-10-CM | POA: Diagnosis not present

## 2016-01-05 ENCOUNTER — Ambulatory Visit
Admission: RE | Admit: 2016-01-05 | Discharge: 2016-01-05 | Disposition: A | Discharge status: Home / Self Care | Payer: Medicare Other | Source: Ambulatory Visit | Attending: Radiation Oncology | Admitting: Radiation Oncology

## 2016-01-05 DIAGNOSIS — Z7982 Long term (current) use of aspirin: Secondary | ICD-10-CM | POA: Diagnosis not present

## 2016-01-05 DIAGNOSIS — C50411 Malignant neoplasm of upper-outer quadrant of right female breast: Secondary | ICD-10-CM | POA: Diagnosis not present

## 2016-01-05 DIAGNOSIS — Z7984 Long term (current) use of oral hypoglycemic drugs: Secondary | ICD-10-CM | POA: Diagnosis not present

## 2016-01-05 DIAGNOSIS — Z79899 Other long term (current) drug therapy: Secondary | ICD-10-CM | POA: Diagnosis not present

## 2016-01-05 DIAGNOSIS — Z510 Encounter for antineoplastic radiation therapy: Secondary | ICD-10-CM | POA: Diagnosis not present

## 2016-01-08 ENCOUNTER — Ambulatory Visit
Admission: RE | Admit: 2016-01-08 | Discharge: 2016-01-08 | Disposition: A | Discharge status: Home / Self Care | Payer: Medicare Other | Source: Ambulatory Visit | Attending: Radiation Oncology | Admitting: Radiation Oncology

## 2016-01-08 DIAGNOSIS — Z79899 Other long term (current) drug therapy: Secondary | ICD-10-CM | POA: Diagnosis not present

## 2016-01-08 DIAGNOSIS — C50411 Malignant neoplasm of upper-outer quadrant of right female breast: Secondary | ICD-10-CM | POA: Diagnosis not present

## 2016-01-08 DIAGNOSIS — Z7984 Long term (current) use of oral hypoglycemic drugs: Secondary | ICD-10-CM | POA: Diagnosis not present

## 2016-01-08 DIAGNOSIS — Z7982 Long term (current) use of aspirin: Secondary | ICD-10-CM | POA: Diagnosis not present

## 2016-01-08 DIAGNOSIS — Z510 Encounter for antineoplastic radiation therapy: Secondary | ICD-10-CM | POA: Diagnosis not present

## 2016-01-09 ENCOUNTER — Ambulatory Visit
Admission: RE | Admit: 2016-01-09 | Discharge: 2016-01-09 | Disposition: A | Discharge status: Home / Self Care | Payer: Medicare Other | Source: Ambulatory Visit | Attending: Radiation Oncology | Admitting: Radiation Oncology

## 2016-01-09 DIAGNOSIS — M545 Low back pain: Secondary | ICD-10-CM | POA: Diagnosis not present

## 2016-01-09 DIAGNOSIS — M79604 Pain in right leg: Secondary | ICD-10-CM | POA: Diagnosis not present

## 2016-01-09 DIAGNOSIS — M6281 Muscle weakness (generalized): Secondary | ICD-10-CM | POA: Diagnosis not present

## 2016-01-09 DIAGNOSIS — Z7984 Long term (current) use of oral hypoglycemic drugs: Secondary | ICD-10-CM | POA: Diagnosis not present

## 2016-01-09 DIAGNOSIS — Z7982 Long term (current) use of aspirin: Secondary | ICD-10-CM | POA: Diagnosis not present

## 2016-01-09 DIAGNOSIS — Z510 Encounter for antineoplastic radiation therapy: Secondary | ICD-10-CM | POA: Diagnosis not present

## 2016-01-09 DIAGNOSIS — M79605 Pain in left leg: Secondary | ICD-10-CM | POA: Diagnosis not present

## 2016-01-09 DIAGNOSIS — C50411 Malignant neoplasm of upper-outer quadrant of right female breast: Secondary | ICD-10-CM | POA: Diagnosis not present

## 2016-01-09 DIAGNOSIS — Z79899 Other long term (current) drug therapy: Secondary | ICD-10-CM | POA: Diagnosis not present

## 2016-01-10 ENCOUNTER — Ambulatory Visit
Admission: RE | Admit: 2016-01-10 | Discharge: 2016-01-10 | Disposition: A | Discharge status: Home / Self Care | Payer: Medicare Other | Source: Ambulatory Visit | Attending: Radiation Oncology | Admitting: Radiation Oncology

## 2016-01-10 DIAGNOSIS — Z7984 Long term (current) use of oral hypoglycemic drugs: Secondary | ICD-10-CM | POA: Diagnosis not present

## 2016-01-10 DIAGNOSIS — Z79899 Other long term (current) drug therapy: Secondary | ICD-10-CM | POA: Diagnosis not present

## 2016-01-10 DIAGNOSIS — Z7982 Long term (current) use of aspirin: Secondary | ICD-10-CM | POA: Diagnosis not present

## 2016-01-10 DIAGNOSIS — Z510 Encounter for antineoplastic radiation therapy: Secondary | ICD-10-CM | POA: Diagnosis not present

## 2016-01-10 DIAGNOSIS — C50411 Malignant neoplasm of upper-outer quadrant of right female breast: Secondary | ICD-10-CM | POA: Diagnosis not present

## 2016-01-11 ENCOUNTER — Ambulatory Visit
Admission: RE | Admit: 2016-01-11 | Discharge: 2016-01-11 | Disposition: A | Discharge status: Home / Self Care | Payer: Medicare Other | Source: Ambulatory Visit | Attending: Radiation Oncology | Admitting: Radiation Oncology

## 2016-01-11 DIAGNOSIS — C50411 Malignant neoplasm of upper-outer quadrant of right female breast: Secondary | ICD-10-CM | POA: Diagnosis not present

## 2016-01-11 DIAGNOSIS — M6281 Muscle weakness (generalized): Secondary | ICD-10-CM | POA: Diagnosis not present

## 2016-01-11 DIAGNOSIS — Z79899 Other long term (current) drug therapy: Secondary | ICD-10-CM | POA: Diagnosis not present

## 2016-01-11 DIAGNOSIS — M79604 Pain in right leg: Secondary | ICD-10-CM | POA: Diagnosis not present

## 2016-01-11 DIAGNOSIS — Z7982 Long term (current) use of aspirin: Secondary | ICD-10-CM | POA: Diagnosis not present

## 2016-01-11 DIAGNOSIS — M545 Low back pain: Secondary | ICD-10-CM | POA: Diagnosis not present

## 2016-01-11 DIAGNOSIS — Z510 Encounter for antineoplastic radiation therapy: Secondary | ICD-10-CM | POA: Diagnosis not present

## 2016-01-11 DIAGNOSIS — Z7984 Long term (current) use of oral hypoglycemic drugs: Secondary | ICD-10-CM | POA: Diagnosis not present

## 2016-01-11 DIAGNOSIS — M79605 Pain in left leg: Secondary | ICD-10-CM | POA: Diagnosis not present

## 2016-01-12 ENCOUNTER — Ambulatory Visit
Admission: RE | Admit: 2016-01-12 | Discharge: 2016-01-12 | Disposition: A | Discharge status: Home / Self Care | Payer: Medicare Other | Source: Ambulatory Visit | Attending: Radiation Oncology | Admitting: Radiation Oncology

## 2016-01-12 VITALS — BP 139/73 | HR 71 | Temp 98.7°F | Resp 18 | Ht 61.0 in | Wt 251.8 lb

## 2016-01-12 DIAGNOSIS — C50411 Malignant neoplasm of upper-outer quadrant of right female breast: Secondary | ICD-10-CM | POA: Diagnosis not present

## 2016-01-12 DIAGNOSIS — Z7984 Long term (current) use of oral hypoglycemic drugs: Secondary | ICD-10-CM | POA: Diagnosis not present

## 2016-01-12 DIAGNOSIS — Z510 Encounter for antineoplastic radiation therapy: Secondary | ICD-10-CM | POA: Diagnosis not present

## 2016-01-12 DIAGNOSIS — Z7982 Long term (current) use of aspirin: Secondary | ICD-10-CM | POA: Diagnosis not present

## 2016-01-12 DIAGNOSIS — Z79899 Other long term (current) drug therapy: Secondary | ICD-10-CM | POA: Diagnosis not present

## 2016-01-12 NOTE — Progress Notes (Signed)
Department of Radiation Oncology  Phone:  [phone] Fax:        [phone]  Weekly Treatment Note    Name: Denise [name] Date: 01/12/2016 MRN: [id number] DOB: 01/12/1946   Diagnosis:     ICD-9-CM ICD-10-CM   1. Breast cancer of upper-outer quadrant of right female breast ([place]) 174.4 C50.411      Current dose: 62.4 Gy  Current fraction: 34   MEDICATIONS: Current Outpatient Prescriptions  Medication Sig Dispense Refill  . ADVAIR DISKUS 100-50 MCG/DOSE AEPB Inhale into the lungs.    Marland Kitchen albuterol (PROVENTIL HFA;VENTOLIN HFA) 108 (90 Base) MCG/ACT inhaler Inhale 2 puffs into the lungs every 6 (six) hours as needed for wheezing or shortness of breath. 1 Inhaler 11  . amLODipine (NORVASC) 5 MG tablet TAKE ONE TABLET BY MOUTH ONCE DAILY. 90 tablet 1  . aspirin 81 MG EC tablet Take 81 mg by mouth daily.      Marland Kitchen atorvastatin (LIPITOR) 40 MG tablet TAKE ONE TABLET BY MOUTH DAILY. 90 tablet 3  . budesonide-formoterol (SYMBICORT) 160-4.5 MCG/ACT inhaler Inhale 2 puffs into the lungs 2 (two) times daily. 3 Inhaler 3  . cetirizine (ZYRTEC) 10 MG tablet TAKE ONE TABLET BY MOUTH ONCE DAILY AS NEEDED FOR ALLERGY 90 tablet 0  . Cholecalciferol (VITAMIN D3) 1000 UNITS CAPS Take 1 each by mouth daily. 90 capsule 1  . fluticasone (FLONASE) 50 MCG/ACT nasal spray Place 2 sprays into both nostrils daily. 16 g 5  . furosemide (LASIX) 40 MG tablet TAKE ONE TABLET BY MOUTH TWICE DAILY 180 tablet 0  . glimepiride (AMARYL) 2 MG tablet TAKE ONE TABLET BY MOUTH ONCE DAILY BEFORE  BREAKFAST. 90 tablet 3  . glucose blood (FREESTYLE LITE) test strip Use as instructed once daily 200 each 11  . levalbuterol (XOPENEX HFA) 45 MCG/ACT inhaler Inhale 2 puffs into the lungs every 6 (six) hours as needed for wheezing or shortness of breath.    . lisinopril (PRINIVIL,ZESTRIL) 40 MG tablet TAKE ONE TABLET BY MOUTH ONCE DAILY 90 tablet 0  . metFORMIN (GLUCOPHAGE-XR) 500 MG 24 hr tablet 4 tabs mouth in the  AM 360 tablet 3  . Multiple Vitamin (MULTIVITAMIN) capsule Take 1 capsule by mouth daily.      . Omega-3 Fatty Acids (FISH OIL) 500 MG CAPS Take 1,000 capsules by mouth daily.     Marland Kitchen oxyCODONE (OXY IR/ROXICODONE) 5 MG immediate release tablet Take 1-2 tablets (5-10 mg total) by mouth every 6 (six) hours as needed for moderate pain, severe pain or breakthrough pain. 30 tablet 0  . potassium chloride (MICRO-K) 10 MEQ CR capsule TAKE FOUR CAPSULES BY MOUTH ONCE DAILY 360 capsule 0  . tiZANidine (ZANAFLEX) 4 MG tablet Take 1 tablet (4 mg total) by mouth every 6 (six) hours as needed for muscle spasms. 60 tablet 1  . HYDROcodone-homatropine (HYCODAN) 5-1.5 MG/5ML syrup Take 5 mLs by mouth every 6 (six) hours as needed for cough. (Patient not taking: Reported on 01/12/2016) 180 mL 0   No current facility-administered medications for this encounter.      ALLERGIES: Clonidine hydrochloride; Hydrocodone-acetaminophen; Rosiglitazone maleate; and Erythromycin   LABORATORY DATA:  Lab Results  Component Value Date   WBC 9.2 12/28/2015   HGB 13.8 12/28/2015   HCT 41.8 12/28/2015   MCV 81.2 12/28/2015   PLT 219.0 12/28/2015   Lab Results  Component Value Date   NA 141 12/28/2015   K 3.7 12/28/2015   CL 103 12/28/2015   CO2 32  12/28/2015   Lab Results  Component Value Date   ALT 23 12/28/2015   AST 20 12/28/2015   ALKPHOS 60 12/28/2015   BILITOT 0.7 12/28/2015     NARRATIVE: Denise [name] was seen today for weekly treatment management. The chart was checked and the patient's films were reviewed.  Denise [name] has received 34 fractions to her right breast.  Skin to right breast with skin breakage around the size of a nickel under the breast when she removed bandage after having fluid removed on Friday 12-29-15, right breast with moist desquamation, using Radiaplex gel bid.  Appetite is good.  Having fatigue most of the day.  Denies pain. Wt Readings from Last 3 Encounters:  01/12/16  251 lb 12.8 oz (114.2 kg)  01/03/16 245 lb 3.2 oz (111.2 kg)  12/29/15 250 lb 14.4 oz (113.8 kg)  BP 139/73 (BP Location: Left Arm, Patient Position: Sitting, Cuff Size: Large)   Pulse 71   Temp 98.7 F (37.1 C) (Oral)   Resp 18   Ht 5\' 1"  (1.549 m)   Wt 251 lb 12.8 oz (114.2 kg)   SpO2 97%   BMI 47.58 kg/m   PHYSICAL EXAMINATION: height is 5\' 1"  (1.549 m) and weight is 251 lb 12.8 oz (114.2 kg). Her oral temperature is 98.7 F (37.1 C). Her blood pressure is 139/73 and her pulse is 71. Her respiration is 18 and oxygen saturation is 97%.      The patient's skin shows some hyperpigmentation. No sign of infection in the seroma cavity region.  ASSESSMENT: The patient is doing satisfactorily with treatment.  PLAN: We will continue with the patient's radiation treatment as planned.

## 2016-01-12 NOTE — Progress Notes (Signed)
Denise [name] has received 34 fractions to her right breast.  Skin to right breast with skin breakage around the size of a nickel under the breast when she removed bandage after having fluid removed on Friday 12-29-15, right breast with moist desquamation, using Radiaplex gel bid.  Appetite is good.  Having fatigue most of the day.  Denies pain. Wt Readings from Last 3 Encounters:  01/12/16 251 lb 12.8 oz (114.2 kg)  01/03/16 245 lb 3.2 oz (111.2 kg)  12/29/15 250 lb 14.4 oz (113.8 kg)  BP 139/73 (BP Location: Left Arm, Patient Position: Sitting, Cuff Size: Large)   Pulse 71   Temp 98.7 F (37.1 C) (Oral)   Resp 18   Ht 5\' 1"  (1.549 m)   Wt 251 lb 12.8 oz (114.2 kg)   SpO2 97%   BMI 47.58 kg/m

## 2016-01-15 ENCOUNTER — Encounter: Payer: Self-pay | Admitting: Radiation Oncology

## 2016-01-15 ENCOUNTER — Telehealth: Payer: Self-pay | Admitting: *Deleted

## 2016-01-15 ENCOUNTER — Ambulatory Visit
Admission: RE | Admit: 2016-01-15 | Discharge: 2016-01-15 | Disposition: A | Discharge status: Home / Self Care | Payer: Medicare Other | Source: Ambulatory Visit | Attending: Radiation Oncology | Admitting: Radiation Oncology

## 2016-01-15 DIAGNOSIS — Z79899 Other long term (current) drug therapy: Secondary | ICD-10-CM | POA: Diagnosis not present

## 2016-01-15 DIAGNOSIS — Z510 Encounter for antineoplastic radiation therapy: Secondary | ICD-10-CM | POA: Diagnosis not present

## 2016-01-15 DIAGNOSIS — C50411 Malignant neoplasm of upper-outer quadrant of right female breast: Secondary | ICD-10-CM

## 2016-01-15 DIAGNOSIS — Z7984 Long term (current) use of oral hypoglycemic drugs: Secondary | ICD-10-CM | POA: Diagnosis not present

## 2016-01-15 DIAGNOSIS — Z7982 Long term (current) use of aspirin: Secondary | ICD-10-CM | POA: Diagnosis not present

## 2016-01-15 NOTE — Telephone Encounter (Signed)
  Oncology Nurse Navigator Documentation  Navigator Location: [facility] (01/15/16 1400) Navigator Encounter Type: Telephone (01/15/16 1400) Telephone: [place] Call (01/15/16 1400)     Surgery Date: 10/17/15 (01/15/16 1400) Treatment Initiated Date: 10/17/15 (01/15/16 1400) Patient Visit Type: RadOnc (01/15/16 1400) Treatment Phase: Final Radiation Tx (01/15/16 1400)                            Time Spent with Patient: 15 (01/15/16 1400)

## 2016-01-16 DIAGNOSIS — M79605 Pain in left leg: Secondary | ICD-10-CM | POA: Diagnosis not present

## 2016-01-16 DIAGNOSIS — M545 Low back pain: Secondary | ICD-10-CM | POA: Diagnosis not present

## 2016-01-16 DIAGNOSIS — M79604 Pain in right leg: Secondary | ICD-10-CM | POA: Diagnosis not present

## 2016-01-16 DIAGNOSIS — M6281 Muscle weakness (generalized): Secondary | ICD-10-CM | POA: Diagnosis not present

## 2016-01-18 DIAGNOSIS — M79604 Pain in right leg: Secondary | ICD-10-CM | POA: Diagnosis not present

## 2016-01-18 DIAGNOSIS — M6281 Muscle weakness (generalized): Secondary | ICD-10-CM | POA: Diagnosis not present

## 2016-01-18 DIAGNOSIS — M545 Low back pain: Secondary | ICD-10-CM | POA: Diagnosis not present

## 2016-01-18 DIAGNOSIS — M79605 Pain in left leg: Secondary | ICD-10-CM | POA: Diagnosis not present

## 2016-01-23 DIAGNOSIS — M545 Low back pain: Secondary | ICD-10-CM | POA: Diagnosis not present

## 2016-01-23 DIAGNOSIS — M6281 Muscle weakness (generalized): Secondary | ICD-10-CM | POA: Diagnosis not present

## 2016-01-23 DIAGNOSIS — M79604 Pain in right leg: Secondary | ICD-10-CM | POA: Diagnosis not present

## 2016-01-23 DIAGNOSIS — M79605 Pain in left leg: Secondary | ICD-10-CM | POA: Diagnosis not present

## 2016-01-24 ENCOUNTER — Other Ambulatory Visit: Payer: Self-pay | Admitting: Internal Medicine

## 2016-01-25 DIAGNOSIS — M79604 Pain in right leg: Secondary | ICD-10-CM | POA: Diagnosis not present

## 2016-01-25 DIAGNOSIS — M6281 Muscle weakness (generalized): Secondary | ICD-10-CM | POA: Diagnosis not present

## 2016-01-25 DIAGNOSIS — M545 Low back pain: Secondary | ICD-10-CM | POA: Diagnosis not present

## 2016-01-25 DIAGNOSIS — M79605 Pain in left leg: Secondary | ICD-10-CM | POA: Diagnosis not present

## 2016-01-31 DIAGNOSIS — M79605 Pain in left leg: Secondary | ICD-10-CM | POA: Diagnosis not present

## 2016-01-31 DIAGNOSIS — M6281 Muscle weakness (generalized): Secondary | ICD-10-CM | POA: Diagnosis not present

## 2016-01-31 DIAGNOSIS — M79604 Pain in right leg: Secondary | ICD-10-CM | POA: Diagnosis not present

## 2016-01-31 DIAGNOSIS — M545 Low back pain: Secondary | ICD-10-CM | POA: Diagnosis not present

## 2016-02-04 NOTE — Progress Notes (Signed)
  Radiation Oncology         [phone] ________________________________  Name: Denise [name] MRN: [id number]  Date: 01/15/2016  DOB: May 06, 1945  End of Treatment Note  Diagnosis:   Right-sided breast cancer     Indication for treatment:  Curative       Radiation treatment dates:   11/27/2015 through 01/15/2016  Site/dose:   The patient initially received a dose of 50.4 Gy in 28 fractions to the breast using whole-breast tangent fields. This was delivered using a 3-D conformal technique. The patient then received a boost to the seroma. This delivered an additional 14 Gy in 7 fractions using a 3 field photon boost technique. The total dose was 64.4 Gy.  Narrative: The patient tolerated radiation treatment relatively well.   The patient had some expected skin irritation as she progressed during treatment. Moist desquamation was not present at the end of treatment.  Plan: The patient has completed radiation treatment. The patient will return to radiation oncology clinic for routine followup in one month. I advised the patient to call or return sooner if they have any questions or concerns related to their recovery or treatment. ________________________________  Jodelle Gross, M.D., Ph.D.

## 2016-02-08 ENCOUNTER — Ambulatory Visit (HOSPITAL_BASED_OUTPATIENT_CLINIC_OR_DEPARTMENT_OTHER): Payer: Medicare Other | Admitting: Hematology and Oncology

## 2016-02-08 ENCOUNTER — Encounter: Payer: Self-pay | Admitting: Hematology and Oncology

## 2016-02-08 DIAGNOSIS — Z170 Estrogen receptor positive status [ER+]: Secondary | ICD-10-CM | POA: Diagnosis not present

## 2016-02-08 DIAGNOSIS — C50411 Malignant neoplasm of upper-outer quadrant of right female breast: Secondary | ICD-10-CM

## 2016-02-08 DIAGNOSIS — M5137 Other intervertebral disc degeneration, lumbosacral region: Secondary | ICD-10-CM | POA: Diagnosis not present

## 2016-02-08 MED ORDER — ANASTROZOLE 1 MG PO TABS: 1.0000 mg | ORAL_TABLET | Freq: Every day | ORAL | 3 refills | Status: DC

## 2016-02-08 NOTE — Assessment & Plan Note (Signed)
Right lumpectomy 10/16/2068: IDC grade 2, 1.2 cm, intermediate grade DCIS, margins negative although less than 0.1 cm to medial margin and superior margin, 0/1 lymph node negative, ER 100%, PR 90%, HER-2 negative ratio 1.37, T1 cN0 stage IA Oncotype DX score 15, 9% risk of recurrence Adjuvant radiation therapy 11/27/2015 to 01/15/2016  Treatment plan: Adjuvant antiestrogen therapy with anastrozole 1 mg by mouth daily  Anastrozole counseling: We discussed the risks and benefits of anti-estrogen therapy with aromatase inhibitors. These include but not limited to insomnia, hot flashes, mood changes, vaginal dryness, bone density loss, and weight gain. We strongly believe that the benefits far outweigh the risks. Patient understands these risks and consented to starting treatment. Planned treatment duration is 5 years.  Return to clinic in 3 months for follow-up

## 2016-02-08 NOTE — Progress Notes (Signed)
Patient Care Team: Biagio Borg, MD as PCP - General Sylvan Cheese, NP as Nurse Practitioner (Hematology and Oncology)  DIAGNOSIS: Breast cancer of upper-outer quadrant of right female breast [facility])   Staging form: Breast, AJCC 7th Edition   - Clinical stage from 09/27/2015: Stage IA (T1b, N0, M0) - Unsigned         Staging comments: Staged at breast conference on 5.31.17  SUMMARY OF ONCOLOGIC HISTORY:   Breast cancer of upper-outer quadrant of right female breast ([place])   09/18/2015 Initial Diagnosis    Screening right breast mass 9:00: 1 x 0.9 x 0.7 cm: Grade 2 IDC plus DCIS ER 100%, PR 90%, HER-2 negative ratio 1.38, Ki-67 10%; UOQ lesion 3 mm calcification plus distortion: Bx rec; T1 BN 0 stage IA clinical stage      10/17/2015 Surgery    Right lumpectomy: IDC grade 2, 1.2 cm, intermediate grade DCIS, margins neg although < 0.1 cm to medial and superior margins, 0/1 LN negative, ER 100%, PR 90%, HER-2 negative ratio 1.37, T1 cN0 stage IA, Oncotype DX 15, 9% ROR, low risk      11/27/2015 - 01/15/2016 Radiation Therapy    Adjuvant radiation therapy       CHIEF COMPLIANT: follow-up after radiation therapy  INTERVAL HISTORY: Denise [name] is a 70 year old with above-mentioned history of right breast cancer were lumpectomy and radiation therapy. She is here for follow-up to discuss starting antiestrogen therapy. She reports that the skin has healed very well although it is discolored. She is getting back to physical therapy. She is trying to get stronger and exercise more frequently. She is excited about going to [place] to stay with her daughter November.  REVIEW OF SYSTEMS:   Constitutional: Denies fevers, chills or abnormal weight loss Eyes: Denies blurriness of vision Ears, nose, mouth, throat, and face: Denies mucositis or sore throat Respiratory: Denies cough, dyspnea or wheezes Cardiovascular: Denies palpitation, chest discomfort Gastrointestinal:  Denies  nausea, heartburn or change in bowel habits Skin: Denies abnormal skin rashes Lymphatics: Denies new lymphadenopathy or easy bruising Neurological:Denies numbness, tingling or new weaknesses Behavioral/Psych: Mood is stable, no new changes  Extremities: No lower extremity edema Breast:  denies any pain or lumps or nodules in either breasts All other systems were reviewed with the patient and are negative.  I have reviewed the past medical history, past surgical history, social history and family history with the patient and they are unchanged from previous note.  ALLERGIES:  is allergic to clonidine hydrochloride; hydrocodone-acetaminophen; rosiglitazone maleate; and erythromycin.  MEDICATIONS:  Current Outpatient Prescriptions  Medication Sig Dispense Refill  . ADVAIR DISKUS 100-50 MCG/DOSE AEPB Inhale into the lungs.    Marland Kitchen albuterol (PROVENTIL HFA;VENTOLIN HFA) 108 (90 Base) MCG/ACT inhaler Inhale 2 puffs into the lungs every 6 (six) hours as needed for wheezing or shortness of breath. 1 Inhaler 11  . amLODipine (NORVASC) 5 MG tablet TAKE ONE TABLET BY MOUTH ONCE DAILY. 90 tablet 1  . amLODipine (NORVASC) 5 MG tablet TAKE ONE TABLET BY MOUTH ONCE DAILY 90 tablet 0  . aspirin 81 MG EC tablet Take 81 mg by mouth daily.      Marland Kitchen atorvastatin (LIPITOR) 40 MG tablet TAKE ONE TABLET BY MOUTH DAILY. 90 tablet 3  . budesonide-formoterol (SYMBICORT) 160-4.5 MCG/ACT inhaler Inhale 2 puffs into the lungs 2 (two) times daily. 3 Inhaler 3  . cetirizine (ZYRTEC) 10 MG tablet TAKE ONE TABLET BY MOUTH ONCE DAILY AS NEEDED FOR  ALLERGIES 90 tablet 0  . Cholecalciferol (VITAMIN D3) 1000 UNITS CAPS Take 1 each by mouth daily. 90 capsule 1  . fluticasone (FLONASE) 50 MCG/ACT nasal spray Place 2 sprays into both nostrils daily. 16 g 5  . furosemide (LASIX) 40 MG tablet TAKE ONE TABLET BY MOUTH TWICE DAILY 180 tablet 0  . glimepiride (AMARYL) 2 MG tablet TAKE ONE TABLET BY MOUTH ONCE DAILY BEFORE  BREAKFAST. 90  tablet 3  . glucose blood (FREESTYLE LITE) test strip Use as instructed once daily 200 each 11  . HYDROcodone-homatropine (HYCODAN) 5-1.5 MG/5ML syrup Take 5 mLs by mouth every 6 (six) hours as needed for cough. (Patient not taking: Reported on 01/12/2016) 180 mL 0  . levalbuterol (XOPENEX HFA) 45 MCG/ACT inhaler Inhale 2 puffs into the lungs every 6 (six) hours as needed for wheezing or shortness of breath.    . lisinopril (PRINIVIL,ZESTRIL) 40 MG tablet TAKE ONE TABLET BY MOUTH ONCE DAILY 90 tablet 0  . metFORMIN (GLUCOPHAGE-XR) 500 MG 24 hr tablet 4 tabs mouth in the AM 360 tablet 3  . Multiple Vitamin (MULTIVITAMIN) capsule Take 1 capsule by mouth daily.      . Omega-3 Fatty Acids (FISH OIL) 500 MG CAPS Take 1,000 capsules by mouth daily.     Marland Kitchen oxyCODONE (OXY IR/ROXICODONE) 5 MG immediate release tablet Take 1-2 tablets (5-10 mg total) by mouth every 6 (six) hours as needed for moderate pain, severe pain or breakthrough pain. 30 tablet 0  . potassium chloride (MICRO-K) 10 MEQ CR capsule TAKE FOUR CAPSULES BY MOUTH ONCE DAILY 360 capsule 0  . tiZANidine (ZANAFLEX) 4 MG tablet Take 1 tablet (4 mg total) by mouth every 6 (six) hours as needed for muscle spasms. 60 tablet 1   No current facility-administered medications for this visit.     PHYSICAL EXAMINATION: ECOG PERFORMANCE STATUS: 1 - Symptomatic but completely ambulatory  Vitals:   02/08/16 0817  BP: (!) 150/81  Pulse: 75  Resp: 18  Temp: 98.1 F (36.7 C)   Filed Weights   02/08/16 0817  Weight: 248 lb 3.2 oz (112.6 kg)    GENERAL:alert, no distress and comfortable SKIN: skin color, texture, turgor are normal, no rashes or significant lesions EYES: normal, Conjunctiva are pink and non-injected, sclera clear OROPHARYNX:no exudate, no erythema and lips, buccal mucosa, and tongue normal  NECK: supple, thyroid normal size, non-tender, without nodularity LYMPH:  no palpable lymphadenopathy in the cervical, axillary or  inguinal LUNGS: clear to auscultation and percussion with normal breathing effort HEART: regular rate & rhythm and no murmurs and no lower extremity edema ABDOMEN:abdomen soft, non-tender and normal bowel sounds MUSCULOSKELETAL:no cyanosis of digits and no clubbing  NEURO: alert & oriented x 3 with fluent speech, no focal motor/sensory deficits EXTREMITIES: No lower extremity edema  LABORATORY DATA:  I have reviewed the data as listed   Chemistry      Component Value Date/Time   NA 141 12/28/2015 0943   NA 145 09/27/2015 0840   K 3.7 12/28/2015 0943   K 3.7 09/27/2015 0840   CL 103 12/28/2015 0943   CO2 32 12/28/2015 0943   CO2 27 09/27/2015 0840   BUN 11 12/28/2015 0943   BUN 8.8 09/27/2015 0840   CREATININE 0.79 12/28/2015 0943   CREATININE 0.8 09/27/2015 0840   GLU 82 09/05/2014      Component Value Date/Time   CALCIUM 9.4 12/28/2015 0943   CALCIUM 9.2 09/27/2015 0840   ALKPHOS 60 12/28/2015 0943  ALKPHOS 61 09/27/2015 0840   AST 20 12/28/2015 0943   AST 22 09/27/2015 0840   ALT 23 12/28/2015 0943   ALT 28 09/27/2015 0840   BILITOT 0.7 12/28/2015 0943   BILITOT 0.65 09/27/2015 0840       Lab Results  Component Value Date   WBC 9.2 12/28/2015   HGB 13.8 12/28/2015   HCT 41.8 12/28/2015   MCV 81.2 12/28/2015   PLT 219.0 12/28/2015   NEUTROABS 6.1 12/28/2015     ASSESSMENT & PLAN:  Breast cancer of upper-outer quadrant of right female breast ([place]) Right lumpectomy 10/16/2068: IDC grade 2, 1.2 cm, intermediate grade DCIS, margins negative although less than 0.1 cm to medial margin and superior margin, 0/1 lymph node negative, ER 100%, PR 90%, HER-2 negative ratio 1.37, T1 cN0 stage IA Oncotype DX score 15, 9% risk of recurrence Adjuvant radiation therapy 11/27/2015 to 01/15/2016  Treatment plan: Adjuvant antiestrogen therapy with anastrozole 1 mg by mouth daily  Anastrozole counseling: We discussed the risks and benefits of anti-estrogen therapy with  aromatase inhibitors. These include but not limited to insomnia, hot flashes, mood changes, vaginal dryness, bone density loss, and weight gain. We strongly believe that the benefits far outweigh the risks. Patient understands these risks and consented to starting treatment. Planned treatment duration is 5 years.  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  Patient tells me that she had a bone density last year and was told it was normal. I do not have a copy of this report. Return to clinic in 3 months for follow-up  No orders of the defined types were placed in this encounter.  The patient has a good understanding of the overall plan. she agrees with it. she will call with any problems that may develop before the next visit here.   Rulon Eisenmenger, MD 02/08/16

## 2016-02-14 ENCOUNTER — Telehealth: Payer: Self-pay | Admitting: Adult Health

## 2016-02-14 NOTE — Telephone Encounter (Signed)
11/20 Appointment rescheduled to 12/20 per patient request. The patient will be out of town during the week of 11/20

## 2016-02-19 ENCOUNTER — Other Ambulatory Visit: Payer: Self-pay | Admitting: Internal Medicine

## 2016-02-20 ENCOUNTER — Ambulatory Visit: Payer: Medicare Other | Admitting: Internal Medicine

## 2016-02-22 ENCOUNTER — Ambulatory Visit: Payer: Medicare Other | Admitting: Internal Medicine

## 2016-02-28 ENCOUNTER — Ambulatory Visit
Admission: RE | Admit: 2016-02-28 | Discharge: 2016-02-28 | Disposition: A | Discharge status: Home / Self Care | Payer: Medicare Other | Source: Ambulatory Visit | Attending: Radiation Oncology | Admitting: Radiation Oncology

## 2016-02-28 ENCOUNTER — Encounter: Payer: Self-pay | Admitting: Radiation Oncology

## 2016-02-28 DIAGNOSIS — Z7982 Long term (current) use of aspirin: Secondary | ICD-10-CM | POA: Diagnosis not present

## 2016-02-28 DIAGNOSIS — Z79899 Other long term (current) drug therapy: Secondary | ICD-10-CM | POA: Diagnosis not present

## 2016-02-28 DIAGNOSIS — I10 Essential (primary) hypertension: Secondary | ICD-10-CM | POA: Diagnosis not present

## 2016-02-28 DIAGNOSIS — Z888 Allergy status to other drugs, medicaments and biological substances status: Secondary | ICD-10-CM | POA: Insufficient documentation

## 2016-02-28 DIAGNOSIS — Z923 Personal history of irradiation: Secondary | ICD-10-CM | POA: Diagnosis not present

## 2016-02-28 DIAGNOSIS — Z7984 Long term (current) use of oral hypoglycemic drugs: Secondary | ICD-10-CM | POA: Diagnosis not present

## 2016-02-28 DIAGNOSIS — Z7951 Long term (current) use of inhaled steroids: Secondary | ICD-10-CM | POA: Insufficient documentation

## 2016-02-28 DIAGNOSIS — Z885 Allergy status to narcotic agent status: Secondary | ICD-10-CM | POA: Insufficient documentation

## 2016-02-28 DIAGNOSIS — Z881 Allergy status to other antibiotic agents status: Secondary | ICD-10-CM | POA: Insufficient documentation

## 2016-02-28 DIAGNOSIS — C50411 Malignant neoplasm of upper-outer quadrant of right female breast: Secondary | ICD-10-CM | POA: Diagnosis not present

## 2016-02-28 DIAGNOSIS — Z170 Estrogen receptor positive status [ER+]: Secondary | ICD-10-CM

## 2016-02-28 DIAGNOSIS — Z79811 Long term (current) use of aromatase inhibitors: Secondary | ICD-10-CM | POA: Insufficient documentation

## 2016-02-28 NOTE — Progress Notes (Signed)
Ms. Oleksiak here for reassessment s./p XRT to her  Right Breast, Upper, Outer Quadrant.  She denies any pain. Note hyperpigmentation to her right.  Using coconut oil, and cocao butter.  She states she is listless, and "does not motivation to exercise" at this time.  She has a script from Dr. Lindi Adie for exercise at the [facility], free.  BP (!) 176/78 (BP Location: Left Arm, Patient Position: Sitting, Cuff Size: Large)   Pulse 75   Temp 98.2 F (36.8 C) (Oral)   Ht 5\' 1"  (1.549 m)   Wt 248 lb (112.5 kg)   BMI 46.86 kg/m    Wt Readings from Last 3 Encounters:  02/28/16 248 lb (112.5 kg)  02/08/16 248 lb 3.2 oz (112.6 kg)  01/12/16 251 lb 12.8 oz (114.2 kg)

## 2016-02-28 NOTE — Progress Notes (Signed)
Radiation Oncology         ([phone] ________________________________  Name: Denise [name] MRN: [id number]  Date: 02/28/2016  DOB: [birth year]  Post Treatment Note  CC: Cathlean Cower, MD  Excell Seltzer, MD  Diagnosis:   Stage IA, T1b, N0, M0, ER/PR  Interval Since Last Radiation:  6 weeks   11/27/2015 through 01/15/2016: The patient initially received a dose of 50.4 Gy in 28 fractions to the breast using whole-breast tangent fields. This was delivered using a 3-D conformal technique. The patient then received a boost to the seroma. This delivered an additional 14 Gy in 7 fractions using a 3 field photon boost technique. The total dose was 64.4 Gy.  Narrative:  The patient returns today for routine follow-up. During treatment she did very well with radiotherapy and did not have significant desquamation. Since completing radiation, she has begun estrogen blockade with an aromatase inhibitor on 02/08/16.                          On review of systems, the patient states she is doing well overall. She has been using coconut oil on her right breast. She denies any concerns with her skin, chest pain, shortness of breath, fevers, or chills. No other complaints are noted. Of note she did not take her morning medications today.   ALLERGIES:  is allergic to clonidine hydrochloride; hydrocodone-acetaminophen; rosiglitazone maleate; and erythromycin.  Meds: Current Outpatient Prescriptions  Medication Sig Dispense Refill  . albuterol (PROVENTIL HFA;VENTOLIN HFA) 108 (90 Base) MCG/ACT inhaler Inhale 2 puffs into the lungs every 6 (six) hours as needed for wheezing or shortness of breath. 1 Inhaler 11  . amLODipine (NORVASC) 5 MG tablet TAKE ONE TABLET BY MOUTH ONCE DAILY. 90 tablet 1  . anastrozole (ARIMIDEX) 1 MG tablet Take 1 tablet (1 mg total) by mouth daily. 90 tablet 3  . aspirin 81 MG EC tablet Take 81 mg by mouth daily.      Marland Kitchen atorvastatin (LIPITOR) 40 MG tablet TAKE ONE TABLET BY  MOUTH DAILY. 90 tablet 3  . budesonide-formoterol (SYMBICORT) 160-4.5 MCG/ACT inhaler Inhale 2 puffs into the lungs 2 (two) times daily. 3 Inhaler 3  . cetirizine (ZYRTEC) 10 MG tablet TAKE ONE TABLET BY MOUTH ONCE DAILY AS NEEDED FOR  ALLERGIES 90 tablet 0  . Cholecalciferol (VITAMIN D3) 1000 UNITS CAPS Take 1 each by mouth daily. 90 capsule 1  . fluticasone (FLONASE) 50 MCG/ACT nasal spray Place 2 sprays into both nostrils daily. 16 g 5  . furosemide (LASIX) 40 MG tablet TAKE ONE TABLET BY MOUTH TWICE DAILY 180 tablet 0  . glimepiride (AMARYL) 2 MG tablet TAKE ONE TABLET BY MOUTH ONCE DAILY BEFORE  BREAKFAST. 90 tablet 3  . glucose blood (FREESTYLE LITE) test strip Use as instructed once daily 200 each 11  . lisinopril (PRINIVIL,ZESTRIL) 40 MG tablet TAKE ONE TABLET BY MOUTH ONCE DAILY 90 tablet 0  . metFORMIN (GLUCOPHAGE-XR) 500 MG 24 hr tablet 4 tabs mouth in the AM 360 tablet 3  . Multiple Vitamin (MULTIVITAMIN) capsule Take 1 capsule by mouth daily.      . Omega-3 Fatty Acids (FISH OIL) 500 MG CAPS Take 1,000 capsules by mouth daily.     . potassium chloride (MICRO-K) 10 MEQ CR capsule TAKE FOUR CAPSULES BY MOUTH ONCE DAILY 360 capsule 0  . tiZANidine (ZANAFLEX) 4 MG tablet Take 1 tablet (4 mg total) by mouth every 6 (six) hours as needed  for muscle spasms. 60 tablet 1  . HYDROcodone-homatropine (HYCODAN) 5-1.5 MG/5ML syrup Take 5 mLs by mouth every 6 (six) hours as needed for cough. (Patient not taking: Reported on 02/28/2016) 180 mL 0  . oxyCODONE (OXY IR/ROXICODONE) 5 MG immediate release tablet Take 1-2 tablets (5-10 mg total) by mouth every 6 (six) hours as needed for moderate pain, severe pain or breakthrough pain. (Patient not taking: Reported on 02/28/2016) 30 tablet 0   No current facility-administered medications for this encounter.     Physical Findings:  height is 5\' 1"  (1.549 m) and weight is 248 lb (112.5 kg). Her oral temperature is 98.2 F (36.8 C). Her blood pressure is  176/78 (abnormal) and her pulse is 75.  In general this is a well appearing African American female in no acute distress. She's alert and oriented x4 and appropriate throughout the examination. Cardiopulmonary assessment is negative for acute distress and she exhibits normal effort. The right breast was examined and reveals hyperpigmentation without desquamation.   Lab Findings: Lab Results  Component Value Date   WBC 9.2 12/28/2015   HGB 13.8 12/28/2015   HCT 41.8 12/28/2015   MCV 81.2 12/28/2015   PLT 219.0 12/28/2015     Radiographic Findings: No results found.  Impression/Plan: 1. Stage IA, T1b, N0, M0, ER/PR. The patient has been doing well since completion of radiotherapy. We discussed that we would be happy to continue to follow her as needed, but she will also continue to follow up with Dr. Lindi Adie in medical oncology. She was counseled on skin care  2. Survivorship. The patient has survivorship appointment scheduled and will be seen in December. 3. HTN. The patient has a way to check her BP at home. I have encouraged her to take her norvasc when she returns home, and about an hour to take her BP. She understands that she needs to keep her PCP informed if her BP remains elevated, and to be seen in an urgent setting if she develops cardiopulmonary symptoms.     Carola Rhine, PAC

## 2016-02-29 ENCOUNTER — Other Ambulatory Visit: Payer: Self-pay | Admitting: Internal Medicine

## 2016-03-10 ENCOUNTER — Other Ambulatory Visit: Payer: Self-pay | Admitting: Internal Medicine

## 2016-03-13 ENCOUNTER — Ambulatory Visit (INDEPENDENT_AMBULATORY_CARE_PROVIDER_SITE_OTHER): Payer: Medicare Other | Admitting: Podiatry

## 2016-03-13 ENCOUNTER — Encounter: Payer: Self-pay | Admitting: Podiatry

## 2016-03-13 VITALS — BP 179/98 | HR 88 | Resp 18

## 2016-03-13 DIAGNOSIS — M65341 Trigger finger, right ring finger: Secondary | ICD-10-CM | POA: Diagnosis not present

## 2016-03-13 DIAGNOSIS — L84 Corns and callosities: Secondary | ICD-10-CM

## 2016-03-13 DIAGNOSIS — L608 Other nail disorders: Secondary | ICD-10-CM

## 2016-03-13 DIAGNOSIS — M79641 Pain in right hand: Secondary | ICD-10-CM | POA: Diagnosis not present

## 2016-03-13 DIAGNOSIS — E119 Type 2 diabetes mellitus without complications: Secondary | ICD-10-CM

## 2016-03-13 DIAGNOSIS — M79642 Pain in left hand: Secondary | ICD-10-CM | POA: Diagnosis not present

## 2016-03-13 DIAGNOSIS — M1812 Unilateral primary osteoarthritis of first carpometacarpal joint, left hand: Secondary | ICD-10-CM | POA: Diagnosis not present

## 2016-03-13 NOTE — Patient Instructions (Signed)
Today your diabetic foot screen demonstrated adequate regulation and feeling in your feet. Today I trim the toenails and corns on your toes. Return as needed or yearly  Diabetes and Foot Care Diabetes may cause you to have problems because of poor blood supply (circulation) to your feet and legs. This may cause the skin on your feet to become thinner, break easier, and heal more slowly. Your skin may become dry, and the skin may peel and crack. You may also have nerve damage in your legs and feet causing decreased feeling in them. You may not notice minor injuries to your feet that could lead to infections or more serious problems. Taking care of your feet is one of the most important things you can do for yourself. Follow these instructions at home:  Wear shoes at all times, even in the house. Do not go barefoot. Bare feet are easily injured.  Check your feet daily for blisters, cuts, and redness. If you cannot see the bottom of your feet, use a mirror or ask someone for help.  Wash your feet with warm water (do not use hot water) and mild soap. Then pat your feet and the areas between your toes until they are completely dry. Do not soak your feet as this can dry your skin.  Apply a moisturizing lotion or petroleum jelly (that does not contain alcohol and is unscented) to the skin on your feet and to dry, brittle toenails. Do not apply lotion between your toes.  Trim your toenails straight across. Do not dig under them or around the cuticle. File the edges of your nails with an emery board or nail file.  Do not cut corns or calluses or try to remove them with medicine.  Wear clean socks or stockings every day. Make sure they are not too tight. Do not wear knee-high stockings since they may decrease blood flow to your legs.  Wear shoes that fit properly and have enough cushioning. To break in new shoes, wear them for just a few hours a day. This prevents you from injuring your feet. Always look  in your shoes before you put them on to be sure there are no objects inside.  Do not cross your legs. This may decrease the blood flow to your feet.  If you find a minor scrape, cut, or break in the skin on your feet, keep it and the skin around it clean and dry. These areas may be cleansed with mild soap and water. Do not cleanse the area with peroxide, alcohol, or iodine.  When you remove an adhesive bandage, be sure not to damage the skin around it.  If you have a wound, look at it several times a day to make sure it is healing.  Do not use heating pads or hot water bottles. They may burn your skin. If you have lost feeling in your feet or legs, you may not know it is happening until it is too late.  Make sure your health care provider performs a complete foot exam at least annually or more often if you have foot problems. Report any cuts, sores, or bruises to your health care provider immediately. Contact a health care provider if:  You have an injury that is not healing.  You have cuts or breaks in the skin.  You have an ingrown nail.  You notice redness on your legs or feet.  You feel burning or tingling in your legs or feet.  You have pain  or cramps in your legs and feet.  Your legs or feet are numb.  Your feet always feel cold. Get help right away if:  There is increasing redness, swelling, or pain in or around a wound.  There is a red line that goes up your leg.  Pus is coming from a wound.  You develop a fever or as directed by your health care provider.  You notice a bad smell coming from an ulcer or wound. This information is not intended to replace advice given to you by your health care provider. Make sure you discuss any questions you have with your health care provider. Document Released: 04/12/2000 Document Revised: 09/21/2015 Document Reviewed: 09/22/2012 Elsevier Interactive Patient Education  2017 [employer].

## 2016-03-13 NOTE — Progress Notes (Signed)
   Subjective:    Patient ID: Denise [name], female    DOB: 24-Aug-1945, 70 y.o.   MRN: [id number]   HPI      Presents today requesting a generalized diabetic foot screen and is requesting that her toenails trimmed as they are long and rubbing as her shoes and also requesting to corns on her fifth toes trimmed as a uncomfortable. Patient relates ongoing pedicurist treatment for these problems. She was last evaluated in our office on 03/28/2015 and after that visit patient has developed breast cancer stage I and has ongoing treatment with a favorable prognosis. Patient is a diabetic and denies any history of foot ulceration, claudication or amputation   Review of Systems  All other systems reviewed and are negative.      Objective:   Physical Exam   Orientated 3  Vascular: Nonpitting edema bilaterally DP pulses 2/4 bilaterally PT pulses 1/4 bilaterally Capillary reflex immediate bilaterally  Neurological: Sensation to 10 g monofilament wire intact 5/5 right 5/5 left Vibratory sensation reactive bilaterally Ankle reflex equal and reactive bilaterally  Dermatological: Texture and turgor within normal bilaterally The toenails are incurvated, elongated with occasional color changes 6-10 Course lateral fifth toes bilaterally The toenails are elongated and incurvated with normal trophic texture 6-10  Musculoskeletal: Pes planus bilaterally HAV bilaterally There is no restriction in range of motion or crepitus in the ankle, subtalar, or midtarsal joints bilaterally Manual motor testing: Dorsi flexion, plantar flexion, inversion, eversion 5/5 bilaterally    Assessment & Plan:   Assessment: Diabetic with satisfactory neurovascular status Incurvated toenails 6-10 Course fifth toes bilaterally  Plan: Today review the results of the exam with patient and informed her that she has satisfactory neurovascular status. The toenails 6-10 were debrided mechanically and  elected without any bleeding Debrided corns 2 without any bleeding  Discussed follow-up for skin a nail debridement okay for pedicurist is long is no sharp cutting instruments are used Return as needed or yearly

## 2016-03-18 ENCOUNTER — Encounter: Payer: Medicare Other | Admitting: Adult Health

## 2016-03-26 ENCOUNTER — Ambulatory Visit: Payer: Medicare Other | Admitting: Podiatry

## 2016-04-10 DIAGNOSIS — M65341 Trigger finger, right ring finger: Secondary | ICD-10-CM | POA: Diagnosis not present

## 2016-04-10 DIAGNOSIS — M1812 Unilateral primary osteoarthritis of first carpometacarpal joint, left hand: Secondary | ICD-10-CM | POA: Diagnosis not present

## 2016-04-17 ENCOUNTER — Encounter: Payer: Self-pay | Admitting: Adult Health

## 2016-04-17 ENCOUNTER — Ambulatory Visit (HOSPITAL_BASED_OUTPATIENT_CLINIC_OR_DEPARTMENT_OTHER): Payer: Medicare Other | Admitting: Adult Health

## 2016-04-17 ENCOUNTER — Telehealth: Payer: Self-pay | Admitting: *Deleted

## 2016-04-17 VITALS — BP 155/66 | HR 80 | Temp 98.3°F | Resp 18 | Ht 61.0 in | Wt 247.6 lb

## 2016-04-17 DIAGNOSIS — R530 Neoplastic (malignant) related fatigue: Secondary | ICD-10-CM

## 2016-04-17 DIAGNOSIS — C50911 Malignant neoplasm of unspecified site of right female breast: Secondary | ICD-10-CM

## 2016-04-17 DIAGNOSIS — C50411 Malignant neoplasm of upper-outer quadrant of right female breast: Secondary | ICD-10-CM

## 2016-04-17 DIAGNOSIS — N939 Abnormal uterine and vaginal bleeding, unspecified: Secondary | ICD-10-CM | POA: Diagnosis not present

## 2016-04-17 DIAGNOSIS — Z79811 Long term (current) use of aromatase inhibitors: Secondary | ICD-10-CM

## 2016-04-17 DIAGNOSIS — Z780 Asymptomatic menopausal state: Secondary | ICD-10-CM

## 2016-04-17 DIAGNOSIS — Z170 Estrogen receptor positive status [ER+]: Secondary | ICD-10-CM | POA: Diagnosis not present

## 2016-04-17 NOTE — Telephone Encounter (Signed)
Called and spoke to pt about appt scheduled for Bone Density at [facility] on [address] @ 8:40a.Pt verbalized understanding. No further concerns. Message to be fwd to G.Dawson,NP.
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
## 2016-04-25 ENCOUNTER — Ambulatory Visit
Admission: RE | Admit: 2016-04-25 | Discharge: 2016-04-25 | Disposition: A | Discharge status: Home / Self Care | Payer: Medicare Other | Source: Ambulatory Visit | Attending: Adult Health | Admitting: Adult Health

## 2016-04-25 ENCOUNTER — Other Ambulatory Visit: Payer: Self-pay | Admitting: Internal Medicine

## 2016-04-25 DIAGNOSIS — Z79811 Long term (current) use of aromatase inhibitors: Secondary | ICD-10-CM

## 2016-04-25 DIAGNOSIS — Z13820 Encounter for screening for osteoporosis: Secondary | ICD-10-CM | POA: Diagnosis not present

## 2016-04-25 DIAGNOSIS — Z780 Asymptomatic menopausal state: Secondary | ICD-10-CM | POA: Diagnosis not present

## 2016-05-01 ENCOUNTER — Telehealth: Payer: Self-pay | Admitting: *Deleted

## 2016-05-01 NOTE — Telephone Encounter (Signed)
Called pt to inform her of the results of Dexa scan. No answer but left a detailed message to VM that bone density was normal. If she has any questions she can call this nurse [phone]. Message to be fwd to [employer].

## 2016-05-08 NOTE — Assessment & Plan Note (Signed)
Right lumpectomy 10/16/2068: IDC grade 2, 1.2 cm, intermediate grade DCIS, margins negative although less than 0.1 cm to medial margin and superior margin, 0/1 lymph node negative, ER 100%, PR 90%, HER-2 negative ratio 1.37, T1 cN0 stage IA Oncotype DX score 15, 9% risk of recurrence Adjuvant radiation therapy 11/27/2015 to 01/15/2016  Treatment plan: Adjuvant antiestrogen therapy with anastrozole 1 mg by mouth daily started 02/08/16  Anastrozole Toxicities:   Return to clinic in 6 months for follow-up

## 2016-05-09 ENCOUNTER — Encounter: Payer: Self-pay | Admitting: Hematology and Oncology

## 2016-05-09 ENCOUNTER — Ambulatory Visit (HOSPITAL_BASED_OUTPATIENT_CLINIC_OR_DEPARTMENT_OTHER): Payer: Medicare Other | Admitting: Hematology and Oncology

## 2016-05-09 DIAGNOSIS — N951 Menopausal and female climacteric states: Secondary | ICD-10-CM

## 2016-05-09 DIAGNOSIS — E669 Obesity, unspecified: Secondary | ICD-10-CM | POA: Diagnosis not present

## 2016-05-09 DIAGNOSIS — Z170 Estrogen receptor positive status [ER+]: Secondary | ICD-10-CM

## 2016-05-09 DIAGNOSIS — C50411 Malignant neoplasm of upper-outer quadrant of right female breast: Secondary | ICD-10-CM | POA: Diagnosis not present

## 2016-05-09 NOTE — Progress Notes (Signed)
Patient Care Team: Biagio Borg, MD as PCP - General Sylvan Cheese, NP as Nurse Practitioner (Hematology and Oncology)  DIAGNOSIS:  Encounter Diagnosis  Name Primary?  . Malignant neoplasm of upper-outer quadrant of right breast in female, estrogen receptor positive ([place])     SUMMARY OF ONCOLOGIC HISTORY:   Breast cancer of upper-outer quadrant of right female breast ([place])   09/18/2015 Initial Diagnosis    Screening right breast mass 9:00: 1 x 0.9 x 0.7 cm: Grade 2 IDC plus DCIS ER 100%, PR 90%, HER-2 negative ratio 1.38, Ki-67 10%; UOQ lesion 3 mm calcification plus distortion: Bx rec; T1 BN 0 stage IA clinical stage      10/17/2015 Surgery    Right lumpectomy ([facility]): IDC grade 2, 1.2 cm, intermediate grade DCIS, margins neg although < 0.1 cm to medial and superior margins, 0/1 LN negative, ER 100%, PR 90%, HER-2 negative ratio 1.37, T1 cN0 stage IA, Oncotype DX 15, 9% ROR, low risk      11/27/2015 - 01/15/2016 Radiation Therapy    Adjuvant radiation therapy [name]). Right breast: 50.4 Gy in 28 fractions. Right breast boost: 14 Gy in 7 fractions.       02/08/2016 -  Anti-estrogen oral therapy    Anastrozole 1 mg daily. Planned duration of therapy: 5 years.       CHIEF COMPLIANT: Follow-up on anastrozole  INTERVAL HISTORY: Denise [name] is a 71 year old with above-mentioned history of right lumpectomy followed by radiation and is currently on anastrozole therapy. She started anastrozole in October. She is tolerating anastrozole fairly well. She has  Moderate hot flashes. Denies any myalgias. She has chronic arthritis so she does not know if anything new has changed. She is unable to lose weight.  REVIEW OF SYSTEMS:   Constitutional: Denies fevers, chills or abnormal weight loss Eyes: Denies blurriness of vision Ears, nose, mouth, throat, and face: Denies mucositis or sore throat Respiratory: Denies cough, dyspnea or wheezes Cardiovascular: Denies  palpitation, chest discomfort Gastrointestinal:  Denies nausea, heartburn or change in bowel habits Skin: Denies abnormal skin rashes Lymphatics: Denies new lymphadenopathy or easy bruising Neurological:Denies numbness, tingling or new weaknesses Behavioral/Psych: Mood is stable, no new changes  Extremities: No lower extremity edema Breast:  denies any pain or lumps or nodules in either breasts All other systems were reviewed with the patient and are negative.  I have reviewed the past medical history, past surgical history, social history and family history with the patient and they are unchanged from previous note.  ALLERGIES:  is allergic to clonidine hydrochloride; hydrocodone-acetaminophen; rosiglitazone maleate; and erythromycin.  MEDICATIONS:  Current Outpatient Prescriptions  Medication Sig Dispense Refill  . albuterol (PROVENTIL HFA;VENTOLIN HFA) 108 (90 Base) MCG/ACT inhaler Inhale 2 puffs into the lungs every 6 (six) hours as needed for wheezing or shortness of breath. (Patient not taking: Reported on 04/17/2016) 1 Inhaler 11  . amLODipine (NORVASC) 5 MG tablet TAKE ONE TABLET BY MOUTH ONCE DAILY. 90 tablet 1  . amLODipine (NORVASC) 5 MG tablet TAKE ONE TABLET BY MOUTH ONCE DAILY 90 tablet 0  . anastrozole (ARIMIDEX) 1 MG tablet Take 1 tablet (1 mg total) by mouth daily. 90 tablet 3  . aspirin 81 MG EC tablet Take 81 mg by mouth daily.      Marland Kitchen atorvastatin (LIPITOR) 40 MG tablet TAKE ONE TABLET BY MOUTH ONCE DAILY. 90 tablet 3  . budesonide-formoterol (SYMBICORT) 160-4.5 MCG/ACT inhaler Inhale 2 puffs into the lungs 2 (two) times daily. 3  Inhaler 3  . cetirizine (ZYRTEC) 10 MG tablet TAKE ONE TABLET BY MOUTH ONCE DAILY AS NEEDED FOR  ALLERGIES (Patient not taking: Reported on 04/17/2016) 90 tablet 0  . Cholecalciferol (VITAMIN D3) 1000 UNITS CAPS Take 1 each by mouth daily. 90 capsule 1  . fluticasone (FLONASE) 50 MCG/ACT nasal spray Place 2 sprays into both nostrils daily. 16 g 5   . furosemide (LASIX) 40 MG tablet TAKE ONE TABLET BY MOUTH TWICE DAILY 180 tablet 0  . glimepiride (AMARYL) 2 MG tablet TAKE ONE TABLET BY MOUTH ONCE DAILY BEFORE  BREAKFAST. 90 tablet 3  . glucose blood (FREESTYLE LITE) test strip Use as instructed once daily 200 each 11  . HYDROcodone-homatropine (HYCODAN) 5-1.5 MG/5ML syrup Take 5 mLs by mouth every 6 (six) hours as needed for cough. (Patient not taking: Reported on 04/17/2016) 180 mL 0  . lisinopril (PRINIVIL,ZESTRIL) 40 MG tablet TAKE ONE TABLET BY MOUTH ONCE DAILY 90 tablet 1  . metFORMIN (GLUCOPHAGE-XR) 500 MG 24 hr tablet 4 tabs mouth in the AM 360 tablet 3  . Multiple Vitamin (MULTIVITAMIN) capsule Take 1 capsule by mouth daily.      . Omega-3 Fatty Acids (FISH OIL) 500 MG CAPS Take 1,000 capsules by mouth daily.     Marland Kitchen oxyCODONE (OXY IR/ROXICODONE) 5 MG immediate release tablet Take 1-2 tablets (5-10 mg total) by mouth every 6 (six) hours as needed for moderate pain, severe pain or breakthrough pain. (Patient not taking: Reported on 04/17/2016) 30 tablet 0  . potassium chloride (MICRO-K) 10 MEQ CR capsule TAKE FOUR CAPSULES BY MOUTH ONCE DAILY 360 capsule 0  . tiZANidine (ZANAFLEX) 4 MG tablet Take 1 tablet (4 mg total) by mouth every 6 (six) hours as needed for muscle spasms. (Patient not taking: Reported on 04/17/2016) 60 tablet 1   No current facility-administered medications for this visit.     PHYSICAL EXAMINATION: ECOG PERFORMANCE STATUS: 1 - Symptomatic but completely ambulatory  Vitals:   05/09/16 0855  BP: (!) 155/77  Pulse: 77  Resp: 16  Temp: 98 F (36.7 C)   Filed Weights   05/09/16 0855  Weight: 254 lb 9.6 oz (115.5 kg)    GENERAL:alert, no distress and comfortable SKIN: skin color, texture, turgor are normal, no rashes or significant lesions EYES: normal, Conjunctiva are pink and non-injected, sclera clear OROPHARYNX:no exudate, no erythema and lips, buccal mucosa, and tongue normal  NECK: supple, thyroid  normal size, non-tender, without nodularity LYMPH:  no palpable lymphadenopathy in the cervical, axillary or inguinal LUNGS: clear to auscultation and percussion with normal breathing effort HEART: regular rate & rhythm and no murmurs and no lower extremity edema ABDOMEN:abdomen soft, non-tender and normal bowel sounds MUSCULOSKELETAL:no cyanosis of digits and no clubbing  NEURO: alert & oriented x 3 with fluent speech, no focal motor/sensory deficits EXTREMITIES: No lower extremity edema  LABORATORY DATA:  I have reviewed the data as listed   Chemistry      Component Value Date/Time   NA 141 12/28/2015 0943   NA 145 09/27/2015 0840   K 3.7 12/28/2015 0943   K 3.7 09/27/2015 0840   CL 103 12/28/2015 0943   CO2 32 12/28/2015 0943   CO2 27 09/27/2015 0840   BUN 11 12/28/2015 0943   BUN 8.8 09/27/2015 0840   CREATININE 0.79 12/28/2015 0943   CREATININE 0.8 09/27/2015 0840   GLU 82 09/05/2014      Component Value Date/Time   CALCIUM 9.4 12/28/2015 0943   CALCIUM  9.2 09/27/2015 0840   ALKPHOS 60 12/28/2015 0943   ALKPHOS 61 09/27/2015 0840   AST 20 12/28/2015 0943   AST 22 09/27/2015 0840   ALT 23 12/28/2015 0943   ALT 28 09/27/2015 0840   BILITOT 0.7 12/28/2015 0943   BILITOT 0.65 09/27/2015 0840       Lab Results  Component Value Date   WBC 9.2 12/28/2015   HGB 13.8 12/28/2015   HCT 41.8 12/28/2015   MCV 81.2 12/28/2015   PLT 219.0 12/28/2015   NEUTROABS 6.1 12/28/2015    ASSESSMENT & PLAN:  Breast cancer of upper-outer quadrant of right female breast ([place]) Right lumpectomy 10/16/2068: IDC grade 2, 1.2 cm, intermediate grade DCIS, margins negative although less than 0.1 cm to medial margin and superior margin, 0/1 lymph node negative, ER 100%, PR 90%, HER-2 negative ratio 1.37, T1 cN0 stage IA Oncotype DX score 15, 9% risk of recurrence Adjuvant radiation therapy 11/27/2015 to 01/15/2016  Treatment plan: Adjuvant antiestrogen therapy with anastrozole 1 mg by  mouth daily started 02/08/16  Anastrozole Toxicities:  1. Moderate hot flashes and sweats  Obesity: I gave her information on live well program for weight loss patient is also scheduled to join [employer] live strong program. Return to clinic in 6 months for follow-up  I spent 25 minutes talking to the patient of which more than half was spent in counseling and coordination of care.  No orders of the defined types were placed in this encounter.  The patient has a good understanding of the overall plan. she agrees with it. she will call with any problems that may develop before the next visit here.   Rulon Eisenmenger, MD 05/09/16

## 2016-05-26 ENCOUNTER — Other Ambulatory Visit: Payer: Self-pay | Admitting: Internal Medicine

## 2016-06-09 ENCOUNTER — Other Ambulatory Visit: Payer: Self-pay | Admitting: Internal Medicine

## 2016-06-11 ENCOUNTER — Telehealth: Payer: Self-pay

## 2016-06-11 NOTE — Telephone Encounter (Signed)
Pt is having rash on her left are. Red area from scratching and mildly raised with red spots.  Last week this happened to her right arm. The right arm is improving. She was wondering if this was an anastrazole allergy. She started anastrazole in October.  She just remembered during the phone call that days before this she had a bite on her ankle that swelled and blistered. The blister popped and then it all went away in a good week.   Advised her to call PCP

## 2016-06-26 ENCOUNTER — Ambulatory Visit (INDEPENDENT_AMBULATORY_CARE_PROVIDER_SITE_OTHER): Payer: Medicare Other | Admitting: Internal Medicine

## 2016-06-26 ENCOUNTER — Other Ambulatory Visit (INDEPENDENT_AMBULATORY_CARE_PROVIDER_SITE_OTHER): Payer: Medicare Other

## 2016-06-26 ENCOUNTER — Encounter: Payer: Self-pay | Admitting: Internal Medicine

## 2016-06-26 VITALS — BP 146/80 | HR 78 | Temp 98.4°F | Ht 61.0 in | Wt 246.0 lb

## 2016-06-26 DIAGNOSIS — E785 Hyperlipidemia, unspecified: Secondary | ICD-10-CM | POA: Diagnosis not present

## 2016-06-26 DIAGNOSIS — E119 Type 2 diabetes mellitus without complications: Secondary | ICD-10-CM

## 2016-06-26 DIAGNOSIS — R011 Cardiac murmur, unspecified: Secondary | ICD-10-CM | POA: Diagnosis not present

## 2016-06-26 DIAGNOSIS — I10 Essential (primary) hypertension: Secondary | ICD-10-CM

## 2016-06-26 LAB — LIPID PANEL
CHOLESTEROL: 148 mg/dL (ref 0–200)
HDL: 57.1 mg/dL (ref >39.00)
LDL CALC: 74 mg/dL (ref 0–99)
NonHDL: 91.13
Total CHOL/HDL Ratio: 3
Triglycerides: 85 mg/dL (ref 0.0–149.0)
VLDL: 17 mg/dL (ref 0.0–40.0)

## 2016-06-26 LAB — BASIC METABOLIC PANEL
BUN: 10 mg/dL (ref 6–23)
CO2: 30 meq/L (ref 19–32)
Calcium: 9.3 mg/dL (ref 8.4–10.5)
Chloride: 106 mEq/L (ref 96–112)
Creatinine, Ser: 0.75 mg/dL (ref 0.40–1.20)
GFR: 97.98 mL/min (ref >60.00)
Glucose, Bld: 105 mg/dL — ABNORMAL HIGH (ref 70–99)
POTASSIUM: 3.7 meq/L (ref 3.5–5.1)
SODIUM: 141 meq/L (ref 135–145)

## 2016-06-26 LAB — HEPATIC FUNCTION PANEL
ALK PHOS: 57 U/L (ref 39–117)
ALT: 22 U/L (ref 0–35)
AST: 22 U/L (ref 0–37)
Albumin: 4.2 g/dL (ref 3.5–5.2)
Bilirubin, Direct: 0.1 mg/dL (ref 0.0–0.3)
Total Bilirubin: 0.7 mg/dL (ref 0.2–1.2)
Total Protein: 7.3 g/dL (ref 6.0–8.3)

## 2016-06-26 LAB — HEMOGLOBIN A1C: HEMOGLOBIN A1C: 6.6 % — AB (ref 4.6–6.5)

## 2016-06-26 MED ORDER — AMLODIPINE BESYLATE 10 MG PO TABS: 10.0000 mg | ORAL_TABLET | Freq: Every day | ORAL | 3 refills | Status: DC

## 2016-06-26 NOTE — Assessment & Plan Note (Signed)
Uncontrolled, to increase te amlodipine to 10 qd, o/w stable overall by history and exam, recent data reviewed with pt, and pt to continue medical treatment as before,  to f/u any worsening symptoms or concerns BP Readings from Last 3 Encounters:  06/26/16 (!) 146/80  05/09/16 (!) 155/77  04/17/16 (!) 155/66

## 2016-06-26 NOTE — Assessment & Plan Note (Signed)
?   AS - for echo in light of recent doe

## 2016-06-26 NOTE — Assessment & Plan Note (Signed)
stable overall by history and exam, recent data reviewed with pt, and pt to continue medical treatment as before,  to f/u any worsening symptoms or concerns Lab Results  Component Value Date   HGBA1C 6.4 12/28/2015   For f/u labs

## 2016-06-26 NOTE — Assessment & Plan Note (Signed)
stable overall by history and exam, recent data reviewed with pt, and pt to continue medical treatment as before,  to f/u any worsening symptoms or concerns Lab Results  Component Value Date   LDLCALC 91 12/28/2015   Goal ldl < 70, for f/u lab
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
## 2016-06-26 NOTE — Patient Instructions (Signed)
Ok to increase the amlodipine to 10 mg for higher blood pressure  Please continue all other medications as before, and refills have been done if requested.  Please have the pharmacy call with any other refills you may need.  Please continue your efforts at being more active, low cholesterol diet, and weight control..  Please keep your appointments with your specialists as you may have planned  You will be contacted regarding the referral for: Echocardiogram  Please go to the LAB in the Basement (turn left off the elevator) for the tests to be done today  You will be contacted by phone if any changes need to be made immediately.  Otherwise, you will receive a letter about your results with an explanation, but please check with MyChart first.  Please remember to sign up for MyChart if you have not done so, as this will be important to you in the future with finding out test results, communicating by private email, and scheduling acute appointments online when needed.  Please return in 6 months, or sooner if needed

## 2016-07-08 ENCOUNTER — Other Ambulatory Visit: Payer: Self-pay | Admitting: Internal Medicine

## 2016-07-09 NOTE — Telephone Encounter (Signed)
Patient's potassium level is normal---routing to dr Jenny Reichmann, please advise if you are ok with refill, thanks

## 2016-07-09 NOTE — Telephone Encounter (Signed)
Potassium done erx

## 2016-07-18 ENCOUNTER — Ambulatory Visit (HOSPITAL_COMMUNITY): Payer: Medicare Other | Attending: Cardiovascular Disease

## 2016-07-18 ENCOUNTER — Other Ambulatory Visit: Payer: Self-pay

## 2016-07-18 DIAGNOSIS — E785 Hyperlipidemia, unspecified: Secondary | ICD-10-CM | POA: Insufficient documentation

## 2016-07-18 DIAGNOSIS — I10 Essential (primary) hypertension: Secondary | ICD-10-CM | POA: Insufficient documentation

## 2016-07-18 DIAGNOSIS — E669 Obesity, unspecified: Secondary | ICD-10-CM | POA: Insufficient documentation

## 2016-07-18 DIAGNOSIS — R011 Cardiac murmur, unspecified: Secondary | ICD-10-CM | POA: Diagnosis not present

## 2016-07-18 DIAGNOSIS — I350 Nonrheumatic aortic (valve) stenosis: Secondary | ICD-10-CM | POA: Diagnosis not present

## 2016-07-18 DIAGNOSIS — F419 Anxiety disorder, unspecified: Secondary | ICD-10-CM | POA: Diagnosis not present

## 2016-07-18 DIAGNOSIS — E119 Type 2 diabetes mellitus without complications: Secondary | ICD-10-CM | POA: Insufficient documentation

## 2016-07-18 DIAGNOSIS — Z6841 Body Mass Index (BMI) 40.0 and over, adult: Secondary | ICD-10-CM | POA: Diagnosis not present

## 2016-07-18 DIAGNOSIS — J45909 Unspecified asthma, uncomplicated: Secondary | ICD-10-CM | POA: Diagnosis not present

## 2016-07-19 ENCOUNTER — Encounter: Payer: Self-pay | Admitting: Internal Medicine

## 2016-07-19 ENCOUNTER — Other Ambulatory Visit: Payer: Self-pay | Admitting: Internal Medicine

## 2016-07-19 DIAGNOSIS — I350 Nonrheumatic aortic (valve) stenosis: Secondary | ICD-10-CM

## 2016-07-19 HISTORY — DX: Nonrheumatic aortic (valve) stenosis: I35.0

## 2016-08-15 DIAGNOSIS — Z961 Presence of intraocular lens: Secondary | ICD-10-CM | POA: Diagnosis not present

## 2016-08-15 DIAGNOSIS — H52203 Unspecified astigmatism, bilateral: Secondary | ICD-10-CM | POA: Diagnosis not present

## 2016-08-15 DIAGNOSIS — E119 Type 2 diabetes mellitus without complications: Secondary | ICD-10-CM | POA: Diagnosis not present

## 2016-08-15 LAB — HM DIABETES EYE EXAM
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
## 2016-08-19 ENCOUNTER — Ambulatory Visit (INDEPENDENT_AMBULATORY_CARE_PROVIDER_SITE_OTHER): Payer: Medicare Other | Admitting: Cardiovascular Disease

## 2016-08-19 ENCOUNTER — Encounter: Payer: Self-pay | Admitting: Cardiovascular Disease

## 2016-08-19 VITALS — BP 112/78 | HR 59 | Ht 61.0 in | Wt 233.6 lb

## 2016-08-19 DIAGNOSIS — E7800 Pure hypercholesterolemia, unspecified: Secondary | ICD-10-CM | POA: Diagnosis not present

## 2016-08-19 DIAGNOSIS — I110 Hypertensive heart disease with heart failure: Secondary | ICD-10-CM

## 2016-08-19 DIAGNOSIS — I350 Nonrheumatic aortic (valve) stenosis: Secondary | ICD-10-CM | POA: Diagnosis not present

## 2016-08-19 NOTE — Patient Instructions (Signed)
Medication Instructions:  Your physician recommends that you continue on your current medications as directed. Please refer to the Current Medication list given to you today.  Labwork: none  Testing/Procedures: none  Follow-Up: Your physician wants you to follow-up in: 1 year ov You will receive a reminder letter in the mail two months in advance. If you don't receive a letter, please call our office to schedule the follow-up appointment.  You have been referred to Dr Burt Knack at [facility] [place] [address]  If you need a refill on your cardiac medications before your next appointment, please call your pharmacy.

## 2016-08-22 ENCOUNTER — Encounter: Payer: Self-pay | Admitting: Cardiovascular Disease

## 2016-08-22 ENCOUNTER — Ambulatory Visit (INDEPENDENT_AMBULATORY_CARE_PROVIDER_SITE_OTHER): Payer: Medicare Other | Admitting: Cardiovascular Disease

## 2016-08-22 ENCOUNTER — Other Ambulatory Visit: Payer: Self-pay | Admitting: *Deleted

## 2016-08-22 VITALS — BP 132/88 | HR 72 | Ht 61.0 in | Wt 233.0 lb

## 2016-08-22 DIAGNOSIS — I350 Nonrheumatic aortic (valve) stenosis: Secondary | ICD-10-CM | POA: Diagnosis not present
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
## 2016-08-22 NOTE — Patient Instructions (Addendum)
Medication Instructions:  Your physician recommends that you continue on your current medications as directed. Please refer to the Current Medication list given to you today.  Labwork: Your physician recommends that you have lab work today: BMP, CBC and PT/INR  Testing/Procedures: Your physician has requested that you have a cardiac catheterization. Cardiac catheterization is used to diagnose and/or treat various heart conditions. Doctors may recommend this procedure for a number of different reasons. The most common reason is to evaluate chest pain. Chest pain can be a symptom of coronary artery disease (CAD), and cardiac catheterization can show whether plaque is narrowing or blocking your heart's arteries. This procedure is also used to evaluate the valves, as well as measure the blood flow and oxygen levels in different parts of your heart. For further information please visit [email or web address]. Please follow instruction sheet, as given.  Your physician has requested that you have cardiac CT. Cardiac computed tomography (CT) is a painless test that uses an x-ray machine to take clear, detailed pictures of your heart. For further information please visit [email or web address]. Please follow instruction sheet as given.   Non-Cardiac CT Angiography (CTA), is a special type of CT scan that uses a computer to produce multi-dimensional views of major blood vessels throughout the body. In CT angiography, a contrast material is injected through an IV to help visualize the blood vessels (CTA of Chest/Abdomen and Pelvis)  Pre-CT scan instructions:  Please arrive at [facility], Admitting, Entrance A on May 3rd at 9:15 Nothing to eat or drink, No caffeine, No smoking 4 hours prior to test. No herbal supplements by mouth 24 hours prior to test.  Do not take Metformin 24 hours prior to test, Do not take Metformin the day of procedure or 48 hours after procedure  Your physician has requested that  you have a carotid duplex. This test is an ultrasound of the carotid arteries in your neck. It looks at blood flow through these arteries that supply the brain with blood. Allow one hour for this exam. There are no restrictions or special instructions.  Your physician has recommended that you have a pulmonary function test. Pulmonary Function Tests are a group of tests that measure how well air moves in and out of your lungs.  You have been referred to Outpatient Physical Therapy for pre-surgery evaluation.   Follow-Up: You have been referred to Dr Enrique Sack for dental evaluation.  Levonne Spiller RN will contact you in regards to additional TAVR evaluation. Her contact number is [phone].    Any Other Special Instructions Will Be Listed Below (If Applicable).     If you need a refill on your cardiac medications before your next appointment, please call your pharmacy.

## 2016-08-23 LAB — BASIC METABOLIC PANEL
BUN / CREAT RATIO: 17 (ref 12–28)
BUN: 11 mg/dL (ref 8–27)
CO2: 27 mmol/L (ref 18–29)
Calcium: 10.5 mg/dL — ABNORMAL HIGH (ref 8.7–10.3)
Chloride: 103 mmol/L (ref 96–106)
Creatinine, Ser: 0.63 mg/dL (ref 0.57–1.00)
GFR calc Af Amer: 104 mL/min/{1.73_m2} (ref >59)
GFR, EST NON AFRICAN AMERICAN: 91 mL/min/{1.73_m2} (ref >59)
Glucose: 61 mg/dL — ABNORMAL LOW (ref 65–99)
POTASSIUM: 3.9 mmol/L (ref 3.5–5.2)
Sodium: 149 mmol/L — ABNORMAL HIGH (ref 134–144)

## 2016-08-23 LAB — CBC
HEMOGLOBIN: 14.2 g/dL (ref 11.1–15.9)
Hematocrit: 42.3 % (ref 34.0–46.6)
MCH: 26.8 pg (ref 26.6–33.0)
MCHC: 33.6 g/dL (ref 31.5–35.7)
MCV: 80 fL (ref 79–97)
Platelets: 193 10*3/uL (ref 150–379)
RBC: 5.3 x10E6/uL — ABNORMAL HIGH (ref 3.77–5.28)
RDW: 15.3 % (ref 12.3–15.4)
WBC: 9.2 10*3/uL (ref 3.4–10.8)

## 2016-08-23 LAB — PROTIME-INR
INR: 1 (ref 0.8–1.2)
Prothrombin Time: 10.4 s (ref 9.1–12.0)

## 2016-08-24 ENCOUNTER — Encounter: Payer: Self-pay | Admitting: Internal Medicine

## 2016-08-27 ENCOUNTER — Encounter: Payer: Self-pay | Admitting: Cardiovascular Disease

## 2016-08-28 ENCOUNTER — Encounter (HOSPITAL_COMMUNITY): Payer: Self-pay | Admitting: Dentistry

## 2016-08-28 ENCOUNTER — Other Ambulatory Visit (HOSPITAL_COMMUNITY): Payer: Self-pay | Admitting: Dentistry

## 2016-08-28 ENCOUNTER — Ambulatory Visit (HOSPITAL_COMMUNITY): Payer: Self-pay | Admitting: Dentistry

## 2016-08-28 VITALS — BP 122/57 | HR 63 | Temp 98.3°F

## 2016-08-28 DIAGNOSIS — K036 Deposits [accretions] on teeth: Secondary | ICD-10-CM

## 2016-08-28 DIAGNOSIS — K034 Hypercementosis: Secondary | ICD-10-CM

## 2016-08-28 DIAGNOSIS — I350 Nonrheumatic aortic (valve) stenosis: Secondary | ICD-10-CM

## 2016-08-28 DIAGNOSIS — K0530 Chronic periodontitis, unspecified: Secondary | ICD-10-CM

## 2016-08-28 DIAGNOSIS — K06020 Generalized gingival recession, unspecified: Secondary | ICD-10-CM

## 2016-08-28 DIAGNOSIS — Z01810 Encounter for preprocedural cardiovascular examination: Secondary | ICD-10-CM | POA: Diagnosis not present

## 2016-08-28 DIAGNOSIS — M264 Malocclusion, unspecified: Secondary | ICD-10-CM

## 2016-08-28 DIAGNOSIS — K045 Chronic apical periodontitis: Secondary | ICD-10-CM

## 2016-08-28 DIAGNOSIS — Z01818 Encounter for other preprocedural examination: Secondary | ICD-10-CM

## 2016-08-28 DIAGNOSIS — K08409 Partial loss of teeth, unspecified cause, unspecified class: Secondary | ICD-10-CM

## 2016-08-28 DIAGNOSIS — K0889 Other specified disorders of teeth and supporting structures: Secondary | ICD-10-CM

## 2016-08-28 DIAGNOSIS — K029 Dental caries, unspecified: Secondary | ICD-10-CM
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
## 2016-08-28 NOTE — Patient Instructions (Signed)
[facility]    Department of Dental Medicine     DR. Shayan Bramhall      HEART VALVES AND MOUTH CARE:  FACTS:   If you have any infection in your mouth, it can infect your heart valve.  If you heart valve is infected, you will be seriously ill.  Infections in the mouth can be SILENT and do not always cause pain.  Examples of infections in the mouth are gum disease, dental cavities, and abscesses.  Some possible signs of infection are: Bad breath, bleeding gums, or teeth that are sensitive to sweets, hot, and/or cold. There are many other signs as well.  WHAT YOU HAVE TO DO:   Brush your teeth after meals and at bedtime. Spend at least 2 minutes brushing well, especially behind your back teeth and all around your teeth that stand alone. Brush at the gumline also.  Do not go to bed without brushing your teeth and flossing.  If you gums bleed when you brush or floss, do NOT stop brushing or flossing. It usually means that your gums need more attention and better cleaning.   If your Dentist or Dr. Robben Jagiello gave you a prescription mouthwash to use, make sure to use it as directed. If you run out of the medication, get a refill at the pharmacy.   If you were given any other medications or directions by your Dentist, please follow them. If you did not understand the directions or forget what you were told, please call. We will be happy to refresh her memory.  If you need antibiotics before dental procedures, make sure you take them one hour prior to every dental visit as directed.   Get a dental checkup every 4-6 months in order to keep your mouth healthy, or to find and treat any new infection. You will most likely need your teeth cleaned or gums treated at the same time.  If you are not able to come in for your scheduled appointment, call your Dentist as soon as possible to reschedule.  If you have a problem in between dental visits, call your Dentist.  

## 2016-08-29 ENCOUNTER — Ambulatory Visit: Payer: Medicare Other | Admitting: Physical Therapy

## 2016-08-29 ENCOUNTER — Encounter: Payer: Self-pay | Admitting: Physical Therapy

## 2016-08-29 ENCOUNTER — Ambulatory Visit (HOSPITAL_COMMUNITY)
Admission: RE | Admit: 2016-08-29 | Discharge: 2016-08-29 | Disposition: A | Discharge status: Home / Self Care | Payer: Medicare Other | Source: Ambulatory Visit | Attending: Cardiovascular Disease | Admitting: Cardiovascular Disease

## 2016-08-29 ENCOUNTER — Encounter (HOSPITAL_COMMUNITY): Payer: Self-pay

## 2016-08-29 ENCOUNTER — Encounter: Payer: Self-pay | Admitting: Cardiovascular Disease

## 2016-08-29 DIAGNOSIS — R2689 Other abnormalities of gait and mobility: Secondary | ICD-10-CM | POA: Insufficient documentation

## 2016-08-29 DIAGNOSIS — J984 Other disorders of lung: Secondary | ICD-10-CM | POA: Diagnosis not present

## 2016-08-29 DIAGNOSIS — K5730 Diverticulosis of large intestine without perforation or abscess without bleeding: Secondary | ICD-10-CM | POA: Diagnosis not present

## 2016-08-29 DIAGNOSIS — I350 Nonrheumatic aortic (valve) stenosis: Secondary | ICD-10-CM

## 2016-08-29 DIAGNOSIS — N6489 Other specified disorders of breast: Secondary | ICD-10-CM | POA: Insufficient documentation

## 2016-08-29 DIAGNOSIS — G8929 Other chronic pain: Secondary | ICD-10-CM | POA: Diagnosis not present

## 2016-08-29 DIAGNOSIS — R293 Abnormal posture: Secondary | ICD-10-CM | POA: Insufficient documentation

## 2016-08-29 DIAGNOSIS — I2510 Atherosclerotic heart disease of native coronary artery without angina pectoris: Secondary | ICD-10-CM | POA: Diagnosis not present

## 2016-08-29 DIAGNOSIS — M1990 Unspecified osteoarthritis, unspecified site: Secondary | ICD-10-CM | POA: Insufficient documentation

## 2016-08-29 DIAGNOSIS — M6281 Muscle weakness (generalized): Secondary | ICD-10-CM | POA: Insufficient documentation

## 2016-08-29 DIAGNOSIS — I700 Atherosclerosis of aorta: Secondary | ICD-10-CM | POA: Insufficient documentation

## 2016-08-29 DIAGNOSIS — Z01818 Encounter for other preprocedural examination: Secondary | ICD-10-CM | POA: Diagnosis not present

## 2016-08-29 LAB — PULMONARY FUNCTION TEST
DL/VA % pred: 101 %
DL/VA: 4.44 ml/min/mmHg/L
DLCO cor % pred: 79 %
DLCO cor: 15.98 ml/min/mmHg
DLCO unc % pred: 80 %
DLCO unc: 16.36 ml/min/mmHg
FEF 25-75 Post: 2.45 L/sec
FEF 25-75 Pre: 1.53 L/sec
FEF2575-%Change-Post: 59 %
FEF2575-%Pred-Post: 169 %
FEF2575-%Pred-Pre: 106 %
FEV1-%Change-Post: 12 %
FEV1-%Pred-Post: 114 %
FEV1-%Pred-Pre: 101 %
FEV1-Post: 1.76 L
FEV1-Pre: 1.56 L
FEV1FVC-%Change-Post: -3 %
FEV1FVC-%Pred-Pre: 104 %
FEV6-%Change-Post: 16 %
FEV6-%Pred-Post: 115 %
FEV6-%Pred-Pre: 99 %
FEV6-Post: 2.21 L
FEV6-Pre: 1.9 L
FEV6FVC-%Change-Post: 0 %
FEV6FVC-%Pred-Post: 102 %
FEV6FVC-%Pred-Pre: 102 %
FVC-%Change-Post: 16 %
FVC-%Pred-Post: 112 %
FVC-%Pred-Pre: 96 %
FVC-Post: 2.26 L
FVC-Pre: 1.94 L
Post FEV1/FVC ratio: 78 %
Post FEV6/FVC ratio: 98 %
Pre FEV1/FVC ratio: 81 %
Pre FEV6/FVC Ratio: 98 %
RV % pred: 126 %
RV: 2.61 L
TLC % pred: 103 %
TLC: 4.75 L

## 2016-08-29 MED ORDER — ALBUTEROL SULFATE (2.5 MG/3ML) 0.083% IN NEBU
2.5000 mg | INHALATION_SOLUTION | Freq: Once | RESPIRATORY_TRACT | Status: AC
  Administered 2016-08-29: 2.5 mg via RESPIRATORY_TRACT

## 2016-08-29 MED ORDER — IOPAMIDOL (ISOVUE-370) INJECTION 76%
INTRAVENOUS | Status: AC
  Administered 2016-08-29: 75 mL
  Filled 2016-08-29: qty 50

## 2016-08-29 MED ORDER — IOPAMIDOL (ISOVUE-370) INJECTION 76%
INTRAVENOUS | Status: AC
  Administered 2016-08-29: 75 mL
  Filled 2016-08-29: qty 100
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
## 2016-08-31 ENCOUNTER — Other Ambulatory Visit: Payer: Self-pay | Admitting: Internal Medicine

## 2016-09-03 ENCOUNTER — Telehealth: Payer: Self-pay | Admitting: Cardiovascular Disease

## 2016-09-03 NOTE — Telephone Encounter (Signed)
I will forward this message to Dr Burt Knack.  The pt's daughter is returning his call.

## 2016-09-03 NOTE — Telephone Encounter (Signed)
Denise [name] calling, states that she is returning a call to Dr. Burt Knack about some information that he wanted to share with her. Thanks.

## 2016-09-03 NOTE — Telephone Encounter (Signed)
Called back. Went to [employer]. Mailbox full.

## 2016-09-04 ENCOUNTER — Encounter (HOSPITAL_COMMUNITY): Payer: Self-pay

## 2016-09-04 ENCOUNTER — Ambulatory Visit (HOSPITAL_COMMUNITY)
Admission: RE | Admit: 2016-09-04 | Discharge: 2016-09-04 | Disposition: A | Discharge status: Home / Self Care | Payer: Medicare Other | Source: Ambulatory Visit | Attending: Cardiovascular Disease | Admitting: Cardiovascular Disease

## 2016-09-04 ENCOUNTER — Encounter (HOSPITAL_COMMUNITY)
Admission: RE | Disposition: A | Discharge status: Home / Self Care | Payer: Self-pay | Source: Ambulatory Visit | Attending: Cardiovascular Disease

## 2016-09-04 ENCOUNTER — Inpatient Hospital Stay (HOSPITAL_COMMUNITY)
Admission: RE | Admit: 2016-09-04 | Discharge: 2016-09-04 | Disposition: A | Discharge status: Home / Self Care | Payer: Self-pay | Source: Ambulatory Visit

## 2016-09-04 ENCOUNTER — Encounter: Payer: Self-pay | Admitting: Internal Medicine

## 2016-09-04 DIAGNOSIS — Z8371 Family history of colonic polyps: Secondary | ICD-10-CM | POA: Diagnosis not present

## 2016-09-04 DIAGNOSIS — I700 Atherosclerosis of aorta: Secondary | ICD-10-CM | POA: Insufficient documentation

## 2016-09-04 DIAGNOSIS — I10 Essential (primary) hypertension: Secondary | ICD-10-CM | POA: Insufficient documentation

## 2016-09-04 DIAGNOSIS — Z7982 Long term (current) use of aspirin: Secondary | ICD-10-CM | POA: Diagnosis not present

## 2016-09-04 DIAGNOSIS — Z8249 Family history of ischemic heart disease and other diseases of the circulatory system: Secondary | ICD-10-CM | POA: Diagnosis not present

## 2016-09-04 DIAGNOSIS — F329 Major depressive disorder, single episode, unspecified: Secondary | ICD-10-CM | POA: Diagnosis not present

## 2016-09-04 DIAGNOSIS — E7800 Pure hypercholesterolemia, unspecified: Secondary | ICD-10-CM | POA: Insufficient documentation

## 2016-09-04 DIAGNOSIS — Z7951 Long term (current) use of inhaled steroids: Secondary | ICD-10-CM | POA: Diagnosis not present

## 2016-09-04 DIAGNOSIS — E119 Type 2 diabetes mellitus without complications: Secondary | ICD-10-CM | POA: Insufficient documentation

## 2016-09-04 DIAGNOSIS — Z6841 Body Mass Index (BMI) 40.0 and over, adult: Secondary | ICD-10-CM | POA: Insufficient documentation

## 2016-09-04 DIAGNOSIS — Z7984 Long term (current) use of oral hypoglycemic drugs: Secondary | ICD-10-CM | POA: Insufficient documentation

## 2016-09-04 DIAGNOSIS — M170 Bilateral primary osteoarthritis of knee: Secondary | ICD-10-CM | POA: Insufficient documentation

## 2016-09-04 DIAGNOSIS — J45901 Unspecified asthma with (acute) exacerbation: Secondary | ICD-10-CM | POA: Diagnosis not present

## 2016-09-04 DIAGNOSIS — K219 Gastro-esophageal reflux disease without esophagitis: Secondary | ICD-10-CM | POA: Diagnosis not present

## 2016-09-04 DIAGNOSIS — F419 Anxiety disorder, unspecified: Secondary | ICD-10-CM | POA: Insufficient documentation

## 2016-09-04 DIAGNOSIS — C50911 Malignant neoplasm of unspecified site of right female breast: Secondary | ICD-10-CM | POA: Diagnosis not present

## 2016-09-04 DIAGNOSIS — Z79899 Other long term (current) drug therapy: Secondary | ICD-10-CM | POA: Diagnosis not present

## 2016-09-04 DIAGNOSIS — Z853 Personal history of malignant neoplasm of breast: Secondary | ICD-10-CM | POA: Diagnosis not present

## 2016-09-04 DIAGNOSIS — Z87891 Personal history of nicotine dependence: Secondary | ICD-10-CM | POA: Diagnosis not present

## 2016-09-04 DIAGNOSIS — M5136 Other intervertebral disc degeneration, lumbar region: Secondary | ICD-10-CM | POA: Insufficient documentation

## 2016-09-04 DIAGNOSIS — Z981 Arthrodesis status: Secondary | ICD-10-CM | POA: Insufficient documentation

## 2016-09-04 DIAGNOSIS — I350 Nonrheumatic aortic (valve) stenosis: Secondary | ICD-10-CM

## 2016-09-04 DIAGNOSIS — M4800 Spinal stenosis, site unspecified: Secondary | ICD-10-CM | POA: Diagnosis not present

## 2016-09-04 DIAGNOSIS — K045 Chronic apical periodontitis: Secondary | ICD-10-CM | POA: Diagnosis not present

## 2016-09-04 DIAGNOSIS — J45909 Unspecified asthma, uncomplicated: Secondary | ICD-10-CM | POA: Insufficient documentation

## 2016-09-04 HISTORY — PX: RIGHT/LEFT HEART CATH AND CORONARY ANGIOGRAPHY: CATH118266

## 2016-09-04 LAB — POCT I-STAT 3, ART BLOOD GAS (G3+)
ACID-BASE DEFICIT: 1 mmol/L (ref 0.0–2.0)
Bicarbonate: 24.9 mmol/L (ref 20.0–28.0)
O2 SAT: 97 %
PCO2 ART: 45.9 mmHg (ref 32.0–48.0)
TCO2: 26 mmol/L (ref 0–100)
pH, Arterial: 7.343 — ABNORMAL LOW (ref 7.350–7.450)
pO2, Arterial: 100 mmHg (ref 83.0–108.0)

## 2016-09-04 LAB — GLUCOSE, CAPILLARY
Glucose-Capillary: 91 mg/dL (ref 65–99)
Glucose-Capillary: 81 mg/dL (ref 65–99)

## 2016-09-04 LAB — POCT I-STAT 3, VENOUS BLOOD GAS (G3P V)
BICARBONATE: 25.8 mmol/L (ref 20.0–28.0)
O2 SAT: 76 %
PO2 VEN: 44 mmHg (ref 32.0–45.0)
TCO2: 27 mmol/L (ref 0–100)
pCO2, Ven: 47.7 mmHg (ref 44.0–60.0)
pH, Ven: 7.342 (ref 7.250–7.430)

## 2016-09-04 LAB — BASIC METABOLIC PANEL
Anion gap: 9 (ref 5–15)
BUN: 13 mg/dL (ref 6–20)
CHLORIDE: 107 mmol/L (ref 101–111)
CO2: 27 mmol/L (ref 22–32)
CREATININE: 0.63 mg/dL (ref 0.44–1.00)
Calcium: 9.1 mg/dL (ref 8.9–10.3)
GFR calc Af Amer: >60 mL/min (ref >60)
GFR calc non Af Amer: >60 mL/min (ref >60)
Glucose, Bld: 106 mg/dL — ABNORMAL HIGH (ref 65–99)
Potassium: 3.4 mmol/L — ABNORMAL LOW (ref 3.5–5.1)
Sodium: 143 mmol/L (ref 135–145)

## 2016-09-04 LAB — CBC
HCT: 39.5 % (ref 36.0–46.0)
HEMOGLOBIN: 12.7 g/dL (ref 12.0–15.0)
MCH: 25.9 pg — AB (ref 26.0–34.0)
MCHC: 32.2 g/dL (ref 30.0–36.0)
MCV: 80.6 fL (ref 78.0–100.0)
Platelets: 184 10*3/uL (ref 150–400)
RBC: 4.9 MIL/uL (ref 3.87–5.11)
RDW: 14.9 % (ref 11.5–15.5)
WBC: 6.8 10*3/uL (ref 4.0–10.5)

## 2016-09-04 SURGERY — RIGHT/LEFT HEART CATH AND CORONARY ANGIOGRAPHY
Anesthesia: LOCAL

## 2016-09-04 MED ORDER — IOPAMIDOL (ISOVUE-370) INJECTION 76%
INTRAVENOUS | Status: DC | PRN
  Administered 2016-09-04: 65 mL via INTRA_ARTERIAL

## 2016-09-04 MED ORDER — ONDANSETRON HCL 4 MG/2ML IJ SOLN: 4.0000 mg | Freq: Four times a day (QID) | INTRAMUSCULAR | Status: DC | PRN

## 2016-09-04 MED ORDER — FENTANYL CITRATE (PF) 100 MCG/2ML IJ SOLN
INTRAMUSCULAR | Status: AC
  Filled 2016-09-04: qty 2

## 2016-09-04 MED ORDER — FENTANYL CITRATE (PF) 100 MCG/2ML IJ SOLN
INTRAMUSCULAR | Status: DC | PRN
  Administered 2016-09-04: 25 ug via INTRAVENOUS

## 2016-09-04 MED ORDER — SODIUM CHLORIDE 0.9 % WEIGHT BASED INFUSION: 1.0000 mL/kg/h | INTRAVENOUS | Status: DC

## 2016-09-04 MED ORDER — LIDOCAINE HCL (PF) 1 % IJ SOLN
INTRAMUSCULAR | Status: AC
  Filled 2016-09-04: qty 30

## 2016-09-04 MED ORDER — MIDAZOLAM HCL 2 MG/2ML IJ SOLN
INTRAMUSCULAR | Status: AC
  Filled 2016-09-04: qty 2

## 2016-09-04 MED ORDER — SODIUM CHLORIDE 0.9 % WEIGHT BASED INFUSION
3.0000 mL/kg/h | INTRAVENOUS | Status: DC
  Administered 2016-09-04: 3 mL/kg/h via INTRAVENOUS

## 2016-09-04 MED ORDER — HEPARIN (PORCINE) IN NACL 2-0.9 UNIT/ML-% IJ SOLN
INTRAMUSCULAR | Status: AC
  Filled 2016-09-04: qty 1000

## 2016-09-04 MED ORDER — SODIUM CHLORIDE 0.9% FLUSH: 3.0000 mL | Freq: Two times a day (BID) | INTRAVENOUS | Status: DC

## 2016-09-04 MED ORDER — FLUTICASONE PROPIONATE 50 MCG/ACT NA SUSP: 2.0000 | Freq: Every day | NASAL | 5 refills | Status: DC

## 2016-09-04 MED ORDER — SODIUM CHLORIDE 0.9% FLUSH: 3.0000 mL | INTRAVENOUS | Status: DC | PRN

## 2016-09-04 MED ORDER — HEPARIN (PORCINE) IN NACL 2-0.9 UNIT/ML-% IJ SOLN
INTRAMUSCULAR | Status: DC | PRN
  Administered 2016-09-04: 1000 mL

## 2016-09-04 MED ORDER — SODIUM CHLORIDE 0.9 % WEIGHT BASED INFUSION: 3.0000 mL/kg/h | INTRAVENOUS | Status: DC

## 2016-09-04 MED ORDER — MIDAZOLAM HCL 2 MG/2ML IJ SOLN
INTRAMUSCULAR | Status: DC | PRN
  Administered 2016-09-04: 2 mg via INTRAVENOUS

## 2016-09-04 MED ORDER — SODIUM CHLORIDE 0.9 % IV SOLN: 250.0000 mL | INTRAVENOUS | Status: DC | PRN

## 2016-09-04 MED ORDER — LIDOCAINE HCL (PF) 1 % IJ SOLN
INTRAMUSCULAR | Status: DC | PRN
  Administered 2016-09-04: 25 mL via SUBCUTANEOUS

## 2016-09-04 MED ORDER — IOPAMIDOL (ISOVUE-370) INJECTION 76%
INTRAVENOUS | Status: AC
  Filled 2016-09-04: qty 100

## 2016-09-04 MED ORDER — FUROSEMIDE 40 MG PO TABS: 40.0000 mg | ORAL_TABLET | Freq: Two times a day (BID) | ORAL | 1 refills | Status: DC

## 2016-09-04 SURGICAL SUPPLY — 11 items
CATH INFINITI 5FR MULTPACK ANG (CATHETERS) ×2 IMPLANT
CATH SWAN GANZ 7F STRAIGHT (CATHETERS) ×2 IMPLANT
GUIDEWIRE 3MM J TIP .035 145 (WIRE) ×2 IMPLANT
KIT HEART LEFT (KITS) ×2 IMPLANT
KIT HEART RIGHT NAMIC (KITS) ×2 IMPLANT
PACK CARDIAC CATHETERIZATION (CUSTOM PROCEDURE TRAY) ×2 IMPLANT
SHEATH PINNACLE 5F 10CM (SHEATH) ×2 IMPLANT
SHEATH PINNACLE 7F 10CM (SHEATH) ×2 IMPLANT
SYR MEDRAD MARK V 150ML (SYRINGE) ×2 IMPLANT
TRANSDUCER W/STOPCOCK (MISCELLANEOUS) ×2 IMPLANT
TUBING CIL FLEX 10 FLL-RA (TUBING) ×2 IMPLANT

## 2016-09-04 NOTE — Telephone Encounter (Signed)
Dr Burt Knack spoke with pt's daughter at time of cardiac cath.

## 2016-09-04 NOTE — Progress Notes (Signed)
2 IVT nurses unable to get second IV access even with ultrasound Dr Burt Knack notified

## 2016-09-04 NOTE — Progress Notes (Signed)
Site area: Right groin a 5 french arterial and 7 french venous sheath was removed by Burlene Arnt RTR  Site Prior to Removal:  Level 0  Pressure Applied For 15 MINUTES    Bedrest Beginning at 1120am  Manual:   Yes.    Patient Status During Pull:  stable  Post Pull Groin Site:  Level 0  Post Pull Instructions Given:  Yes.    Post Pull Pulses Present:  Yes.    Dressing Applied:  Yes.    Comments:  VS remain stable during sheath pull

## 2016-09-04 NOTE — Pre-Procedure Instructions (Signed)
Denise [name]  09/04/2016      [place] [place] [address] [place] [place] [address] Phone: [phone] Fax: [phone]    Your procedure is scheduled on May 14 at 730.  Report to [facility] Admitting at 530 A.M.  Call this number if you have problems the morning of surgery:  [phone]   Remember:  Do not eat food or drink liquids after midnight.  Take these medicines the morning of surgery with A SIP OF WATER amlodipine (norvasc), arimidex (anastrozole), symbicort, cetirizine (zyrtec), flonase, tizanidine (zanaflex)  7 days prior to surgery STOP taking any Aspirin, Aleve, Naproxen, Ibuprofen, Motrin, Advil, Goody's, BC's, all herbal medications, fish oil, and all vitamins   Do not wear jewelry, make-up or nail polish.  Do not wear lotions, powders, or perfumes, or deoderant.  Do not shave 48 hours prior to surgery.  Men may shave face and neck.  Do not bring valuables to the hospital.  [facility] is not responsible for any belongings or valuables.  Contacts, dentures or bridgework may not be worn into surgery.  Leave your suitcase in the car.  After surgery it may be brought to your room.  For patients admitted to the hospital, discharge time will be determined by your treatment team.  Patients discharged the day of surgery will not be allowed to drive home.   Special instructions:  [facility]- Preparing For Surgery  Before surgery, you can play an important role. Because skin is not sterile, your skin needs to be as free of germs as possible. You can reduce the number of germs on your skin by washing with CHG (chlorahexidine gluconate) Soap before surgery.  CHG is an antiseptic cleaner which kills germs and bonds with the skin to continue killing germs even after washing.  Please do not use if you have an allergy to CHG or antibacterial soaps. If your skin becomes reddened/irritated stop  using the CHG.  Do not shave (including legs and underarms) for at least 48 hours prior to first CHG shower. It is OK to shave your face.  Please follow these instructions carefully.   1. Shower the NIGHT BEFORE SURGERY and the MORNING OF SURGERY with CHG.   2. If you chose to wash your hair, wash your hair first as usual with your normal shampoo.  3. After you shampoo, rinse your hair and body thoroughly to remove the shampoo.  4. Use CHG as you would any other liquid soap. You can apply CHG directly to the skin and wash gently with a scrungie or a clean washcloth.   5. Apply the CHG Soap to your body ONLY FROM THE NECK DOWN.  Do not use on open wounds or open sores. Avoid contact with your eyes, ears, mouth and genitals (private parts). Wash genitals (private parts) with your normal soap.  6. Wash thoroughly, paying special attention to the area where your surgery will be performed.  7. Thoroughly rinse your body with warm water from the neck down.  8. DO NOT shower/wash with your normal soap after using and rinsing off the CHG Soap.  9. Pat yourself dry with a CLEAN TOWEL.   10. Wear CLEAN PAJAMAS   11. Place CLEAN SHEETS on your bed the night of your first shower and DO NOT SLEEP WITH PETS.    Day of Surgery: Do not apply any deodorants/lotions. Please wear clean clothes to the hospital/surgery center.  How to Manage Your Diabetes Before and After Surgery  Why is it important to control my blood sugar before and after surgery? . Improving blood sugar levels before and after surgery helps healing and can limit problems. . A way of improving blood sugar control is eating a healthy diet by: o  Eating less sugar and carbohydrates o  Increasing activity/exercise o  Talking with your doctor about reaching your blood sugar goals . High blood sugars (greater than 180 mg/dL) can raise your risk of infections and slow your recovery, so you will need to focus on controlling  your diabetes during the weeks before surgery. . Make sure that the doctor who takes care of your diabetes knows about your planned surgery including the date and location.  How do I manage my blood sugar before surgery? . Check your blood sugar at least 4 times a day, starting 2 days before surgery, to make sure that the level is not too high or low. o Check your blood sugar the morning of your surgery when you wake up and every 2 hours until you get to the Short Stay unit. . If your blood sugar is less than 70 mg/dL, you will need to treat for low blood sugar: o Do not take insulin. o Treat a low blood sugar (less than 70 mg/dL) with  cup of clear juice (cranberry or apple), 4 glucose tablets, OR glucose gel. o Recheck blood sugar in 15 minutes after treatment (to make sure it is greater than 70 mg/dL). If your blood sugar is not greater than 70 mg/dL on recheck, call [phone] for further instructions. . Report your blood sugar to the short stay nurse when you get to Short Stay.  . If you are admitted to the hospital after surgery: o Your blood sugar will be checked by the staff and you will probably be given insulin after surgery (instead of oral diabetes medicines) to make sure you have good blood sugar levels. o The goal for blood sugar control after surgery is 80-180 mg/dL.         WHAT DO I DO ABOUT MY DIABETES MEDICATION?   Marland Kitchen Do not take oral diabetes medicines (pills) the morning of surgery.    Patient Signature:  Date:   Nurse Signature:  Date:   Reviewed and Endorsed by [facility] Patient Education Committee, August 2015  Please read over the following fact sheets that you were given. Pain Booklet, Coughing and Deep Breathing and Surgical Site Infection Prevention

## 2016-09-04 NOTE — Progress Notes (Signed)
QPY:PPJK, Hunt Oris, MD  Cardiologist: Dr. Sherren Mocha  DTO:09/7122 in [facility]  Stress test: denies  PYKD:12/8336  Cardiac Cath:09/04/2016 in [facility]  Chest x-ray: 10/2015
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
## 2016-09-04 NOTE — Discharge Instructions (Signed)

## 2016-09-04 NOTE — Interval H&P Note (Signed)
History and Physical Interval Note:  09/04/2016 10:04 AM  Denise [name]  has presented today for surgery, with the diagnosis of aortic stenosis  The various methods of treatment have been discussed with the patient and family. After consideration of risks, benefits and other options for treatment, the patient has consented to  Procedure(s): Right/Left Heart Cath and Coronary Angiography (N/A) as a surgical intervention .  The patient's history has been reviewed, patient examined, no change in status, stable for surgery.  I have reviewed the patient's chart and labs.  Questions were answered to the patient's satisfaction.     Sherren Mocha

## 2016-09-05 ENCOUNTER — Ambulatory Visit (HOSPITAL_COMMUNITY)
Admission: RE | Admit: 2016-09-05 | Discharge: 2016-09-05 | Disposition: A | Discharge status: Home / Self Care | Payer: Medicare Other | Source: Ambulatory Visit | Attending: Cardiovascular Disease | Admitting: Cardiovascular Disease

## 2016-09-05 ENCOUNTER — Encounter (HOSPITAL_COMMUNITY): Payer: Self-pay | Admitting: Cardiovascular Disease

## 2016-09-05 ENCOUNTER — Institutional Professional Consult (permissible substitution) (INDEPENDENT_AMBULATORY_CARE_PROVIDER_SITE_OTHER): Payer: Medicare Other | Admitting: Thoracic Surgery (Cardiothoracic Vascular Surgery)

## 2016-09-05 ENCOUNTER — Other Ambulatory Visit: Payer: Self-pay | Admitting: *Deleted

## 2016-09-05 VITALS — BP 113/70 | HR 67 | Resp 20 | Ht 61.0 in | Wt 230.0 lb

## 2016-09-05 DIAGNOSIS — I350 Nonrheumatic aortic (valve) stenosis: Secondary | ICD-10-CM

## 2016-09-05 DIAGNOSIS — I6523 Occlusion and stenosis of bilateral carotid arteries: Secondary | ICD-10-CM | POA: Insufficient documentation

## 2016-09-05 LAB — VAS US CAROTID
LCCADSYS: -62 cm/s
LCCAPDIAS: 23 cm/s
LEFT ECA DIAS: -12 cm/s
LEFT VERTEBRAL DIAS: -19 cm/s
LICAPDIAS: -37 cm/s
Left CCA dist dias: -24 cm/s
Left CCA prox sys: 78 cm/s
Left ICA dist dias: -22 cm/s
Left ICA dist sys: -61 cm/s
Left ICA prox sys: -97 cm/s
RCCADSYS: -58 cm/s
RCCAPDIAS: 19 cm/s
RCCAPSYS: 86 cm/s
RIGHT ECA DIAS: -19 cm/s
RIGHT VERTEBRAL DIAS: -13 cm/s

## 2016-09-05 LAB — HEMOGLOBIN A1C
HEMOGLOBIN A1C: 5.8 % — AB (ref 4.8–5.6)
Mean Plasma Glucose: 120 mg/dL

## 2016-09-05 NOTE — Progress Notes (Addendum)
Anesthesia Chart Review: Patient is a 71 year old female scheduled for multiple teeth extraction with alveoloplasty and gross debridement of remaining teeth on 09/09/16 by Dr. Enrique Sack. She has severe AS and is being evaluated for possible TAVR versus AVR. (By Dr. Blenda Mounts note, patient is not interested in having an open AVR.). She is seeing CT surgeon Dr. Roxy Manns on 09/05/16 at 4 PM.  History includes severe AS, former smoker, diabetes mellitus type 2, hypercholesterolemia, anxiety, depression, hypertension, asthma, eczema, GERD, right breast cancer s/p right breast lumpectomy and SN biopsy 10/17/15 and radiation, partial thyroidectomy, left TKA 10/09/10, right TKA 11/20/10, L2-3 posterolateral arthrodesis 08/22/14, L4-5 PLIF 04/06/02. BMI is consistent with morbid obesity.  PCP is Dr. Cathlean Cower.  Cardiologist is with [facility]. She saw Dr. Skeet Latch on 08/19/16 and was then referred to Dr. Sherren Mocha due to severe AS and patient's interest in TAVR. HEM-ONC is Dr. Nicholas Lose.  Meds include albuterol, amlodipine, anastrozole, aspirin 81 mg, Lipitor, Symbicort, Zyrtec, Flonase, Lasix, Amaryl, Hycodan, lisinopril, metformin, fish oil, KCl, Zanaflex.  There were no vitals taken for this visit.  EKG 08/19/16: SB at 59 bpm, anteroseptal infarct (age undetermined).  Cardiac cath 09/04/16:  1. The coronary arteries are widely patent with diffuse calcification and minor nonobstructive disease with coronary irregularity 2. Known severe calcific aortic stenosis with calcification and restriction of the aortic valve leaflets 3. Normal right heart hemodynamics Discussion: The patient will continue evaluation for treatment of her severe aortic stenosis by the multidisciplinary heart team. Note by aortic root angiography, the aortic root appears relatively small in size.  Echo 07/18/16: Study Conclusions - Left ventricle: The cavity size was normal. There was moderate   concentric hypertrophy.  Systolic function was normal. The   estimated ejection fraction was in the range of 55% to 60%. Wall   motion was normal; there were no regional wall motion   abnormalities. Doppler parameters are consistent with abnormal   left ventricular relaxation (grade 1 diastolic dysfunction).   Doppler parameters are consistent with elevated mean left atrial   filling pressure. - Aortic valve: There was severe stenosis. There was trivial   regurgitation. - Mitral valve: Calcified annulus. - Left atrium: The atrium was moderately dilated.  She is scheduled for carotid U/S on 09/05/16 at 2:00 PM. (Addendum:  Vascular Ultrasound Carotid Duplex (Doppler) has been completed.  Preliminary findings: Bilateral 1-39% ICA stenosis, antegrade vertebral flow. Prominent, heterogeneous right thyroid lobe.)  CXR 11/15/15: IMPRESSION: Cardiomegaly. Mild pulmonary venous congestion. No pulmonary edema.  PFTs 08/29/16: FVC 1.94 (96%), FEV1 1.56 (101%), DLCO unc 16.36 (80%).  Preoperative labs noted. A1c 5.8.   If no acute changes then I anticipate that she can proceed as planned.  [facility] Stay [facility]/Anesthesiology Phone [phone] 09/05/2016 9:54 AM

## 2016-09-05 NOTE — Progress Notes (Signed)
*  PRELIMINARY RESULTS* Vascular Ultrasound Carotid Duplex (Doppler) has been completed.  Preliminary findings: Bilateral 1-39% ICA stenosis, antegrade vertebral flow. Prominent, heterogeneous right thyroid lobe.   Everrett Coombe 09/05/2016, 2:35 PM

## 2016-09-05 NOTE — Patient Instructions (Signed)
Continue all previous medications without any changes at this time  

## 2016-09-05 NOTE — Progress Notes (Signed)
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  TPVR Index 7.01 HRUI  TSVR Index 29.27 HRUI  PVR SVR Ratio 0.09  TPVR/TSVR Ratio 0.24    Cardiac TAVR CT  TECHNIQUE: The patient was scanned on a Siemens 128 scanner. A 120 kV retrospective scan was triggered in the ascending thoracic aorta at 120 HU's. Gantry rotation speed was 300 msecs and collimation was .6 mm. No beta blockade or nitro were given. The 3D data set was reconstructed in 10% intervals of the R-R cycle. Systolic and diastolic phases were analyzed on a dedicated work station using MPR, MIP and VRT modes. The patient received 80 cc of  contrast.  FINDINGS: Aortic Valve:  Tri-leaflet calcified with restricted motion  Aorta: Moderate atherosclerosis and calcification of the Arch and descending thoracic aorta  LAA:  No Thrombus  Sinotubular Junction:  2.3 cm  Ascending Thoracic Aorta:  3.0 cm  Aortic Arch:  2.6 cm  Descending Thoracic Aorta:  2.7 cm  Sinus of Valsalva Measurements:  Non-coronary:  2.5 cm  Right - coronary:  2.5 cm  Left - coronary:  2.6 cm  Coronary Artery Height above Annulus:  Left Main:  9.5 mm above annulus  Right Coronary:  11 mm above annulus  Virtual Basal Annulus Measurements:  Maximum/Minimum Diameter:  25 mm x 21.8 mm  Perimeter:  74 mm  Area:  436 mm2  Coronary Arteries:  Sufficient height above annulus for deployment  Optimum Fluoroscopic Angle for Delivery:  LAO 26 Caudal 6 degrees  IMPRESSION: 1) Tri-leaflet aortic valve with annulus suitable for an under inflated 26 mm Sapien 3 valve annular area 436 mm2  2) Optimum angiographic angle for deployment LAO 26 Caudal 6 degrees  3) No LAA thrombus  4) Coronary arteries sufficient height above annulus for deployment  5) Moderate calcification and atherosclerosis of arch and descending thoracic aorta with no aneurysm root  measures 3.0 cm  Jenkins [place]   CT ANGIOGRAPHY CHEST, ABDOMEN AND PELVIS  TECHNIQUE: Multidetector CT imaging through the chest, abdomen and pelvis was performed using the standard protocol during bolus administration of intravenous contrast. Multiplanar reconstructed images and MIPs were obtained and reviewed to evaluate the vascular anatomy.  CONTRAST:  75 mL of Isovue 370.  COMPARISON:  None.  FINDINGS: CTA CHEST FINDINGS  Cardiovascular: Heart size is mildly enlarged. There is no significant pericardial fluid, thickening or pericardial calcification. There is aortic atherosclerosis, as well as atherosclerosis of the great vessels of the  mediastinum and the coronary arteries, including calcified atherosclerotic plaque in the left anterior descending and right coronary arteries. Thickening calcification of the aortic valve.  Mediastinum/Lymph Nodes: No pathologically enlarged mediastinal or hilar lymph nodes. Esophagus is unremarkable in appearance. No axillary lymphadenopathy.  Lungs/Pleura: No suspicious appearing pulmonary nodules or masses. No acute consolidative airspace disease. No pleural effusions. Scattered areas of linear scarring are noted in the inferior segment of the lingula and in the right middle lobe.  Musculoskeletal/Soft Tissues: Postoperative changes of prior lumpectomy are noted in the right breast. There is a large postoperative fluid collection measuring 7.7 x 6.5 cm which is centrally low-attenuation and has some peripheral calcifications, presumably a chronic postoperative seroma. Extensive skin thickening throughout the right breast may reflect changes from prior surgery and/or radiation therapy. There are no aggressive appearing lytic or blastic lesions noted in the visualized portions of the skeleton.  CTA ABDOMEN AND PELVIS FINDINGS  Hepatobiliary: No cystic or solid hepatic lesions. No intra or extrahepatic biliary ductal dilatation. Gallbladder is normal in appearance.  Pancreas: No pancreatic mass. No pancreatic ductal dilatation. No pancreatic or peripancreatic fluid or inflammatory changes.  Spleen: Unremarkable.  Adrenals/Urinary Tract: 3.4 cm low-attenuation lesion in the upper pole of the left kidney is compatible with a simple cyst. Right kidney and bilateral adrenal glands are normal in appearance. No hydroureteronephrosis. Urinary bladder is normal in appearance.  Stomach/Bowel: The appearance of the stomach is normal. There is no pathologic dilatation of small bowel or colon. Numerous colonic diverticulae are noted, particularly in the sigmoid colon,  without surrounding inflammatory changes to suggest an acute diverticulitis at this time. Normal appendix.  Vascular/Lymphatic: Aortic atherosclerosis, with vascular findings and measurements pertinent to potential TAVR procedure, as detailed below. No aneurysm or dissection noted in the abdominal or pelvic vasculature. Celiac axis, superior mesenteric artery and inferior mesenteric artery are all widely patent, without hemodynamically significant stenosis. Single renal arteries bilaterally are widely patent. No lymphadenopathy noted in the abdomen or pelvis.  Reproductive: Uterus and ovaries are unremarkable in appearance.  Other: No significant volume of ascites.  No pneumoperitoneum.  Musculoskeletal: Status post PLIF at L2-L3. Interbody grafts are noted at L3-L4, L4-L5 and L5-S1. Interbody cage at L2-L3. There are no aggressive appearing lytic or blastic lesions noted in the visualized portions of the skeleton.  VASCULAR MEASUREMENTS PERTINENT TO TAVR:  AORTA:  Minimal Aortic Diameter -  16 x 16 mm  Severity of Aortic Calcification -  mild  RIGHT PELVIS:  Right Common Iliac Artery -  Minimal Diameter - 7.9 x 9.8 mm  Tortuosity - mild  Calcification - mild  Right External Iliac Artery -  Minimal Diameter - 7.9 x 7.4 mm  Tortuosity - moderate  Calcification - none  Right Common Femoral Artery -  Minimal Diameter - 7.6 x 8.0 mm  Tortuosity - mild  Calcification - none  LEFT PELVIS:  Left Common Iliac  Artery -  Minimal Diameter - 11.5 x 10.6 mm  Tortuosity - mild  Calcification - mild  Left External Iliac Artery -  Minimal Diameter - 8.6 x 8.4 mm  Tortuosity - moderate  Calcification - none  Left Common Femoral Artery -  Minimal Diameter - 8.0 x 7.2 mm  Tortuosity - mild  Calcification - none  Review of the MIP images confirms the above findings.  IMPRESSION: 1. Vascular findings and measurements  pertinent to potential TAVR procedure, as detailed above. The patient does appear to have suitable pelvic arterial access bilaterally. 2. Severe thickening calcification of the aortic valve, compatible with the reported clinical history of severe aortic stenosis. 3. Aortic atherosclerosis, in addition to 2 vessel coronary artery disease. Assessment for potential risk factor modification, dietary therapy or pharmacologic therapy may be warranted, if clinically indicated. 4. Postoperative changes in the right breast related to prior lumpectomy with postoperative fluid collection, presumably a chronic seroma, and extensive skin thickening throughout the right breast which may reflect prior radiation therapy. 5. No definite findings to suggest metastatic disease in the chest, abdomen or pelvis. 6. Colonic diverticulosis without evidence of acute diverticulitis at this time. 7. Additional incidental findings, as above.   Electronically Signed   By: Vinnie Langton M.D.   On: 08/30/2016 12:56    STS Risk Calculator  Procedure    AVR  Risk of Mortality   3.28% Morbidity or Mortality  23.0% Prolonged LOS   12.7% Short LOS    20.3% Permanent Stroke   1.5% Prolonged Vent Support  17.2% DSW Infection    0.6% Renal Failure    7.8% Reoperation    8.3%    Impression:  Patient has stage D severe symptomatic aortic stenosis. She presents with a long history of progressive exertional shortness of breath consistent with chronic diastolic congestive heart failure, New York Heart Association functional class II. I have personally reviewed the patient's recent transthoracic echocardiogram, diagnostic cardiac catheterization, and CT angiograms. Echocardiogram reveals the patient's aortic valve is trileaflet with severe thickening and some degree of calcification involving all 3 leaflets. There is severely restricted leaflet mobility with peak velocity across the aortic valve measured greater  than 4.4 m/s corresponding to mean transvalvular gradient estimated 48 mmHg.  There is moderate left ventricular hypertrophy with significant diastolic dysfunction but left ventricular systolic function remains normal. Diagnostic cardiac catheterization is notable for the presence of only moderate nonobstructive coronary artery disease. I agree the patient needs elective aortic valve replacement. 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  Under the circumstances it would be reasonable to consider transcatheter aortic valve replacement as an alternative to conventional surgery.  Cardiac gated CT angiogram the heart reveals findings consistent with severe aortic stenosis with anatomical characteristics suitable for transcatheter aortic valve replacement without any particular, complicating features. Her aortic annulus appears in the borderline range between that which would accept a 23 and a 26 mm Edwards Sapien 3 transcatheter heart valve. CT angiography of the aorta and iliac vessels demonstrates adequate pelvic vascular access to facilitate transfemoral approach.   Plan:  The patient and her family were counseled at length regarding treatment alternatives for management of severe symptomatic aortic stenosis. Alternative approaches such as conventional aortic valve replacement, transcatheter aortic valve replacement, and palliative medical therapy were compared  and contrasted at length.  The risks associated with conventional surgical aortic valve replacement were been discussed in detail, as were expectations for post-operative convalescence. Issues particular to transcatheter aortic valve replacement were discussed in detail including questions regarding long-term valve  durability, the potential for perivalvular leak, and a possibly slightly increased risk of need for permanent pacemaker following surgery. Long-term prognosis with medical therapy was discussed. This discussion was placed in the context of the patient's own specific clinical presentation and past medical history.  All of their questions been addressed.  The patient has decided she would like to proceed with transcatheter aortic valve replacement in the near future. She is scheduled to undergo dental extraction early next week. Providing that she recovers from this uneventfully, we tentatively plan to proceed with transcatheter aortic valve replacement on 09/24/2016.  Following the decision to proceed with transcatheter aortic valve replacement, a discussion has been held regarding what types of management strategies would be attempted intraoperatively in the event of life-threatening complications, including whether or not the patient would be considered a candidate for the use of cardiopulmonary bypass and/or conversion to open sternotomy for attempted surgical intervention.  The patient has been advised of a variety of complications that might develop including but not limited to risks of death, stroke, paravalvular leak, aortic dissection or other major vascular complications, aortic annulus rupture, device embolization, cardiac rupture or perforation, mitral regurgitation, acute myocardial infarction, arrhythmia, heart block or bradycardia requiring permanent pacemaker placement, congestive heart failure, respiratory failure, renal failure, pneumonia, infection, other late complications related to structural valve deterioration or migration, or other complications that might ultimately cause a temporary or permanent loss of functional independence or other long term morbidity.  The patient provides full informed consent for the procedure as described and all questions were answered.   I spent in excess of  90 minutes during the conduct of this office consultation and >50% of this time involved direct face-to-face encounter with the patient for counseling and/or coordination of their care.   Valentina Gu. Roxy Manns, MD 09/05/2016 3:08 PM

## 2016-09-09 ENCOUNTER — Ambulatory Visit (HOSPITAL_COMMUNITY): Payer: Medicare Other | Admitting: Vascular Surgery

## 2016-09-09 ENCOUNTER — Encounter (HOSPITAL_COMMUNITY)
Admission: RE | Disposition: A | Discharge status: Home / Self Care | Payer: Self-pay | Source: Ambulatory Visit | Attending: Dentistry

## 2016-09-09 ENCOUNTER — Encounter (HOSPITAL_COMMUNITY): Payer: Self-pay | Admitting: Certified Registered Nurse Anesthetist

## 2016-09-09 ENCOUNTER — Ambulatory Visit (HOSPITAL_COMMUNITY)
Admission: RE | Admit: 2016-09-09 | Discharge: 2016-09-09 | Disposition: A | Discharge status: Home / Self Care | Payer: Medicare Other | Source: Ambulatory Visit | Attending: Dentistry | Admitting: Dentistry

## 2016-09-09 ENCOUNTER — Ambulatory Visit (HOSPITAL_COMMUNITY): Payer: Medicare Other | Admitting: Certified Registered Nurse Anesthetist

## 2016-09-09 DIAGNOSIS — K045 Chronic apical periodontitis: Secondary | ICD-10-CM | POA: Diagnosis not present

## 2016-09-09 DIAGNOSIS — M4800 Spinal stenosis, site unspecified: Secondary | ICD-10-CM | POA: Insufficient documentation

## 2016-09-09 DIAGNOSIS — I350 Nonrheumatic aortic (valve) stenosis: Secondary | ICD-10-CM

## 2016-09-09 DIAGNOSIS — I10 Essential (primary) hypertension: Secondary | ICD-10-CM | POA: Diagnosis not present

## 2016-09-09 DIAGNOSIS — K0530 Chronic periodontitis, unspecified: Secondary | ICD-10-CM | POA: Diagnosis not present

## 2016-09-09 DIAGNOSIS — K036 Deposits [accretions] on teeth: Secondary | ICD-10-CM | POA: Diagnosis not present

## 2016-09-09 DIAGNOSIS — F329 Major depressive disorder, single episode, unspecified: Secondary | ICD-10-CM | POA: Insufficient documentation

## 2016-09-09 DIAGNOSIS — J45901 Unspecified asthma with (acute) exacerbation: Secondary | ICD-10-CM | POA: Insufficient documentation

## 2016-09-09 DIAGNOSIS — Z87891 Personal history of nicotine dependence: Secondary | ICD-10-CM | POA: Insufficient documentation

## 2016-09-09 DIAGNOSIS — K0889 Other specified disorders of teeth and supporting structures: Secondary | ICD-10-CM

## 2016-09-09 DIAGNOSIS — M170 Bilateral primary osteoarthritis of knee: Secondary | ICD-10-CM | POA: Diagnosis not present

## 2016-09-09 DIAGNOSIS — Z853 Personal history of malignant neoplasm of breast: Secondary | ICD-10-CM | POA: Insufficient documentation

## 2016-09-09 DIAGNOSIS — F419 Anxiety disorder, unspecified: Secondary | ICD-10-CM | POA: Insufficient documentation

## 2016-09-09 DIAGNOSIS — Z6841 Body Mass Index (BMI) 40.0 and over, adult: Secondary | ICD-10-CM | POA: Insufficient documentation

## 2016-09-09 DIAGNOSIS — Z7982 Long term (current) use of aspirin: Secondary | ICD-10-CM | POA: Insufficient documentation

## 2016-09-09 DIAGNOSIS — E7800 Pure hypercholesterolemia, unspecified: Secondary | ICD-10-CM | POA: Diagnosis not present

## 2016-09-09 DIAGNOSIS — M5136 Other intervertebral disc degeneration, lumbar region: Secondary | ICD-10-CM | POA: Insufficient documentation

## 2016-09-09 DIAGNOSIS — Z79899 Other long term (current) drug therapy: Secondary | ICD-10-CM | POA: Insufficient documentation

## 2016-09-09 DIAGNOSIS — Z7984 Long term (current) use of oral hypoglycemic drugs: Secondary | ICD-10-CM | POA: Insufficient documentation

## 2016-09-09 DIAGNOSIS — E119 Type 2 diabetes mellitus without complications: Secondary | ICD-10-CM | POA: Diagnosis not present

## 2016-09-09 DIAGNOSIS — Z8371 Family history of colonic polyps: Secondary | ICD-10-CM | POA: Insufficient documentation

## 2016-09-09 DIAGNOSIS — K219 Gastro-esophageal reflux disease without esophagitis: Secondary | ICD-10-CM | POA: Insufficient documentation

## 2016-09-09 HISTORY — PX: MULTIPLE EXTRACTIONS WITH ALVEOLOPLASTY: SHX5342

## 2016-09-09 LAB — GLUCOSE, CAPILLARY
Glucose-Capillary: 87 mg/dL (ref 65–99)
Glucose-Capillary: 123 mg/dL — ABNORMAL HIGH (ref 65–99)

## 2016-09-09 SURGERY — MULTIPLE EXTRACTION WITH ALVEOLOPLASTY
Anesthesia: General | Site: Mouth

## 2016-09-09 MED ORDER — LIDOCAINE HCL (CARDIAC) 20 MG/ML IV SOLN
INTRAVENOUS | Status: DC | PRN
  Administered 2016-09-09: 40 mg via INTRAVENOUS

## 2016-09-09 MED ORDER — LIDOCAINE-EPINEPHRINE 2 %-1:100000 IJ SOLN
INTRAMUSCULAR | Status: AC
  Filled 2016-09-09: qty 10.2

## 2016-09-09 MED ORDER — LACTATED RINGERS IV SOLN
INTRAVENOUS | Status: DC | PRN
  Administered 2016-09-09: 07:00:00 via INTRAVENOUS

## 2016-09-09 MED ORDER — HEMOSTATIC AGENTS (NO CHARGE) OPTIME
TOPICAL | Status: DC | PRN
  Administered 2016-09-09: 1 via TOPICAL

## 2016-09-09 MED ORDER — ROCURONIUM BROMIDE 10 MG/ML (PF) SYRINGE
PREFILLED_SYRINGE | INTRAVENOUS | Status: AC
  Filled 2016-09-09: qty 5

## 2016-09-09 MED ORDER — ONDANSETRON HCL 4 MG/2ML IJ SOLN
INTRAMUSCULAR | Status: DC | PRN
  Administered 2016-09-09: 4 mg via INTRAVENOUS

## 2016-09-09 MED ORDER — SUGAMMADEX SODIUM 500 MG/5ML IV SOLN
INTRAVENOUS | Status: DC | PRN
  Administered 2016-09-09: 400 mg via INTRAVENOUS

## 2016-09-09 MED ORDER — PROPOFOL 10 MG/ML IV BOLUS
INTRAVENOUS | Status: DC | PRN
  Administered 2016-09-09: 110 mg via INTRAVENOUS

## 2016-09-09 MED ORDER — PHENYLEPHRINE 40 MCG/ML (10ML) SYRINGE FOR IV PUSH (FOR BLOOD PRESSURE SUPPORT)
PREFILLED_SYRINGE | INTRAVENOUS | Status: AC
  Filled 2016-09-09: qty 10

## 2016-09-09 MED ORDER — LIDOCAINE 2% (20 MG/ML) 5 ML SYRINGE
INTRAMUSCULAR | Status: AC
  Filled 2016-09-09: qty 5

## 2016-09-09 MED ORDER — EPHEDRINE 5 MG/ML INJ
INTRAVENOUS | Status: AC
  Filled 2016-09-09: qty 10

## 2016-09-09 MED ORDER — LIDOCAINE-EPINEPHRINE 2 %-1:100000 IJ SOLN
INTRAMUSCULAR | Status: DC | PRN
  Administered 2016-09-09 (×2): 1.7 mL

## 2016-09-09 MED ORDER — ONDANSETRON HCL 4 MG/2ML IJ SOLN: 4.0000 mg | Freq: Once | INTRAMUSCULAR | Status: DC | PRN

## 2016-09-09 MED ORDER — OXYCODONE-ACETAMINOPHEN 5-325 MG PO TABS: 1.0000 | ORAL_TABLET | Freq: Four times a day (QID) | ORAL | Status: DC | PRN

## 2016-09-09 MED ORDER — FENTANYL CITRATE (PF) 100 MCG/2ML IJ SOLN: 25.0000 ug | INTRAMUSCULAR | Status: DC | PRN

## 2016-09-09 MED ORDER — ROCURONIUM BROMIDE 100 MG/10ML IV SOLN
INTRAVENOUS | Status: DC | PRN
  Administered 2016-09-09: 50 mg via INTRAVENOUS

## 2016-09-09 MED ORDER — MIDAZOLAM HCL 2 MG/2ML IJ SOLN
INTRAMUSCULAR | Status: AC
  Filled 2016-09-09: qty 2

## 2016-09-09 MED ORDER — SUCCINYLCHOLINE CHLORIDE 200 MG/10ML IV SOSY
PREFILLED_SYRINGE | INTRAVENOUS | Status: AC
  Filled 2016-09-09: qty 10

## 2016-09-09 MED ORDER — HYDROCORTISONE NA SUCCINATE PF 250 MG IJ SOLR
INTRAMUSCULAR | Status: AC
  Filled 2016-09-09: qty 250

## 2016-09-09 MED ORDER — SUGAMMADEX SODIUM 200 MG/2ML IV SOLN
INTRAVENOUS | Status: AC
  Filled 2016-09-09: qty 2

## 2016-09-09 MED ORDER — FENTANYL CITRATE (PF) 100 MCG/2ML IJ SOLN
INTRAMUSCULAR | Status: DC | PRN
  Administered 2016-09-09 (×2): 50 ug via INTRAVENOUS

## 2016-09-09 MED ORDER — CEFAZOLIN SODIUM-DEXTROSE 2-4 GM/100ML-% IV SOLN
2.0000 g | Freq: Once | INTRAVENOUS | Status: AC
  Administered 2016-09-09: 2 g via INTRAVENOUS
  Filled 2016-09-09: qty 100

## 2016-09-09 MED ORDER — OXYCODONE-ACETAMINOPHEN 5-325 MG PO TABS: ORAL_TABLET | ORAL | 0 refills | Status: DC

## 2016-09-09 MED ORDER — BUPIVACAINE-EPINEPHRINE (PF) 0.5% -1:200000 IJ SOLN
INTRAMUSCULAR | Status: AC
  Filled 2016-09-09: qty 3.6

## 2016-09-09 MED ORDER — FENTANYL CITRATE (PF) 250 MCG/5ML IJ SOLN
INTRAMUSCULAR | Status: AC
  Filled 2016-09-09: qty 5

## 2016-09-09 MED ORDER — ONDANSETRON HCL 4 MG/2ML IJ SOLN
INTRAMUSCULAR | Status: AC
  Filled 2016-09-09: qty 2

## 2016-09-09 MED ORDER — PHENYLEPHRINE HCL 10 MG/ML IJ SOLN
INTRAMUSCULAR | Status: DC | PRN
  Administered 2016-09-09: 40 ug via INTRAVENOUS
  Administered 2016-09-09: 80 ug via INTRAVENOUS

## 2016-09-09 MED ORDER — BUPIVACAINE-EPINEPHRINE 0.5% -1:200000 IJ SOLN
INTRAMUSCULAR | Status: DC | PRN
  Administered 2016-09-09 (×2): 1.8 mL

## 2016-09-09 MED ORDER — 0.9 % SODIUM CHLORIDE (POUR BTL) OPTIME
TOPICAL | Status: DC | PRN
  Administered 2016-09-09: 1000 mL

## 2016-09-09 SURGICAL SUPPLY — 36 items
ALCOHOL 70% 16 OZ (MISCELLANEOUS) ×2 IMPLANT
ATTRACTOMAT 16X20 MAGNETIC DRP (DRAPES) ×2 IMPLANT
BLADE SURG 15 STRL LF DISP TIS (BLADE) ×2 IMPLANT
BLADE SURG 15 STRL SS (BLADE) ×2
CONT SPECI 4OZ STER CLIK (MISCELLANEOUS) ×4 IMPLANT
COVER SURGICAL LIGHT HANDLE (MISCELLANEOUS) ×2 IMPLANT
GAUZE PACKING FOLDED 2  STR (GAUZE/BANDAGES/DRESSINGS) ×1
GAUZE PACKING FOLDED 2 STR (GAUZE/BANDAGES/DRESSINGS) ×1 IMPLANT
GAUZE SPONGE 4X4 16PLY XRAY LF (GAUZE/BANDAGES/DRESSINGS) ×2 IMPLANT
GLOVE BIOGEL PI IND STRL 6 (GLOVE) ×1 IMPLANT
GLOVE BIOGEL PI INDICATOR 6 (GLOVE) ×1
GLOVE SURG ORTHO 8.0 STRL STRW (GLOVE) ×2 IMPLANT
GLOVE SURG SS PI 6.0 STRL IVOR (GLOVE) ×2 IMPLANT
GOWN STRL REUS W/ TWL LRG LVL3 (GOWN DISPOSABLE) ×1 IMPLANT
GOWN STRL REUS W/TWL 2XL LVL3 (GOWN DISPOSABLE) ×2 IMPLANT
GOWN STRL REUS W/TWL LRG LVL3 (GOWN DISPOSABLE) ×1
HEMOSTAT SURGICEL 2X14 (HEMOSTASIS) ×2 IMPLANT
KIT BASIN OR (CUSTOM PROCEDURE TRAY) ×2 IMPLANT
KIT ROOM TURNOVER OR (KITS) ×2 IMPLANT
MANIFOLD NEPTUNE WASTE (CANNULA) ×2 IMPLANT
NEEDLE BLUNT 16X1.5 OR ONLY (NEEDLE) ×2 IMPLANT
NS IRRIG 1000ML POUR BTL (IV SOLUTION) ×2 IMPLANT
PACK EENT II TURBAN DRAPE (CUSTOM PROCEDURE TRAY) ×2 IMPLANT
PAD ARMBOARD 7.5X6 YLW CONV (MISCELLANEOUS) ×2 IMPLANT
SPONGE SURGIFOAM ABS GEL 100 (HEMOSTASIS) IMPLANT
SPONGE SURGIFOAM ABS GEL 12-7 (HEMOSTASIS) IMPLANT
SPONGE SURGIFOAM ABS GEL SZ50 (HEMOSTASIS) ×2 IMPLANT
SUCTION FRAZIER HANDLE 10FR (MISCELLANEOUS) ×1
SUCTION TUBE FRAZIER 10FR DISP (MISCELLANEOUS) ×1 IMPLANT
SUT CHROMIC 3 0 PS 2 (SUTURE) ×4 IMPLANT
SUT CHROMIC 4 0 P 3 18 (SUTURE) IMPLANT
SYR 50ML SLIP (SYRINGE) ×2 IMPLANT
TOWEL OR 17X26 10 PK STRL BLUE (TOWEL DISPOSABLE) ×2 IMPLANT
TUBE CONNECTING 12X1/4 (SUCTIONS) ×2 IMPLANT
WATER TABLETS ICX (MISCELLANEOUS) ×2 IMPLANT
YANKAUER SUCT BULB TIP NO VENT (SUCTIONS) ×2 IMPLANT

## 2016-09-09 NOTE — Transfer of Care (Signed)
Immediate Anesthesia Transfer of Care Note  Patient: Denise [name]  Procedure(s) Performed: Procedure(s): MULTIPLE EXTRACTION WITH ALVEOLOPLASTY AND GROSS DEBRIDEMENT OF REMAINING TEETH (N/A)  Patient Location: PACU  Anesthesia Type:General  Level of Consciousness: awake and alert   Airway & Oxygen Therapy: Patient Spontanous Breathing and Patient connected to nasal cannula oxygen  Post-op Assessment: Report given to RN and Post -op Vital signs reviewed and stable  Post vital signs: Reviewed and stable  Last Vitals:  Vitals:   09/09/16 0617 09/09/16 0843  BP: (!) 137/55   Pulse: 69   Resp: 20   Temp: 36.7 C 36.1 C    Last Pain:  Vitals:   09/09/16 0617  TempSrc: Oral         Complications: No apparent anesthesia complications

## 2016-09-09 NOTE — Anesthesia Procedure Notes (Signed)
Procedure Name: Intubation Date/Time: 09/09/2016 7:36 AM Performed by: Candis Shine Pre-anesthesia Checklist: Patient identified, Emergency Drugs available, Suction available and Patient being monitored Patient Re-evaluated:Patient Re-evaluated prior to inductionOxygen Delivery Method: Circle System Utilized Preoxygenation: Pre-oxygenation with 100% oxygen Intubation Type: IV induction Ventilation: Mask ventilation without difficulty and Oral airway inserted - appropriate to patient size Laryngoscope Size: Sabra Heck and 2 Grade View: Grade I Tube type: Oral Tube size: 7.0 mm Number of attempts: 1 Airway Equipment and Method: Stylet and Oral airway Placement Confirmation: ETT inserted through vocal cords under direct vision,  positive ETCO2 and breath sounds checked- equal and bilateral Secured at: 22 cm Tube secured with: Tape Dental Injury: Teeth and Oropharynx as per pre-operative assessment

## 2016-09-09 NOTE — Op Note (Signed)
OPERATIVE REPORT  Patient:            Denise [name] Date of Birth:  05-09-1945 MRN:                [id number]   DATE OF PROCEDURE:  09/09/2016  PREOPERATIVE DIAGNOSES: 1. Severe aortic stenosis 2. Pre-heart valve surgery dental protocol 3. Chronic apical periodontitis 4. Chronic periodontitis 5. Accretions 6. Loose teeth  POSTOPERATIVE DIAGNOSES: 1. Severe aortic stenosis 2. Pre-heart valve surgery dental protocol 3. Chronic apical periodontitis 4. Chronic periodontitis 5. Accretions 6. Loose teeth  OPERATIONS: 1. Multiple extraction of tooth numbers 7, 24, and 25 with alveoloplasty 2. Gross debridement of remaining dentition   SURGEON: [name], DDS  ASSISTANT: Camie Patience, (dental assistant)  ANESTHESIA: General anesthesia via oral endotracheal tube.  MEDICATIONS: 1. Ancef 2 g IV prior to invasive dental procedures. 2. Local anesthesia with a total utilization of 2 carpules each containing 34 mg of lidocaine with 0.017 mg of epinephrine as well as 2 carpules each containing 9 mg of bupivacaine with 0.009 mg of epinephrine.  SPECIMENS: The cystic contents of periapical portion of #7 area was submitted to pathology.  DRAINS: None  CULTURES: None  COMPLICATIONS: None   ESTIMATED BLOOD LOSS: Less than 50 mLs.  INTRAVENOUS FLUIDS: 800 mLs of Lactated ringers solution.  INDICATIONS: The patient was recently diagnosed with severe aortic stenosis.  A medically necessary dental consultation was then requested to evaluate poor dentition as part of a pre-heart valve surgery dental protocol.  The patient was examined and treatment planned for multiple extractions with alveoloplasty and gross debridement of remaining dentition in the operating room with general anesthesia.  This treatment plan was formulated to decrease the risks and complications associated with dental infection from affecting the patient's systemic health and anticipated heart valve  surgery.  OPERATIVE FINDINGS: Patient was examined operating room number 10.  The teeth were identified for extraction. The patient was noted to be affected by chronic apical periodontitis, chronic periodontitis, accretions, and loose teeth.  The patient was also noted to have a significant periapical radiolucency associated with the apex tooth number 7 and the contents of the periapical area would be submitted to pathology as indicated.   DESCRIPTION OF PROCEDURE: Patient was brought to the main operating room number 10. Patient was then placed in the supine position on the operating table. General anesthesia was then induced per the anesthesia team. The patient was then prepped and draped in the usual manner for dental medicine procedure. A timeout was performed. The patient was identified and procedures were verified. A throat pack was placed at this time. The oral cavity was then thoroughly examined with the findings noted above. The patient was then ready for dental medicine procedure as follows:  Local anesthesia was then administered sequentially with a total utilization of 2 carpules each containing 34 mg of lidocaine with 0.017 mg of epinephrine as well as 2 carpules  each containing 9 mg bupivacaine with 0.009 mg of epinephrine.  The Maxillary right quadrant was first approached. Anesthesia was then delivered utilizing infiltration with lidocaine with epinephrine. Tooth #7 was then subluxated with a series of straight elevators removed with a 150 forceps without complications.  The apical portion of the socket was curetted out and contents were removed and submitted to pathology to rule out malignancy. The surgical site was irrigated out with sterile saline. The surgical site then had a piece of Surgifoam placed in the extraction socket. The surgical  site was then closed from the mesial numbers 6 and extended to the distal of #8 utilizing 3-0 chromic gut suture in a continuous interrupted suture  technique 1.  At this point time, the mandibular quadrants were approached. The patient was given bilateral mental nerve blocks and long buccal nerve blocks utilizing the bupivacaine with epinephrine. Further infiltration was then achieved utilizing the lidocaine with epinephrine in the area of tooth numbers 24/25. A 15 blade incision was then made from the mesial of #23 and extended the mesial of #26. A surgical flap was then carefully reflected. Tooth numbers 24 and25 were then removed utilizing a 151 forceps without complications. Alveoloplasty was then performed with a rongeur and bone file to help achieve primary closure. The surgical site was irrigated with copious amounts of sterile saline. A piece of Surgifoam was placed in each extraction sockets appropriately. The surgical site was then closed from the mesial of #23 and extended the mesial #26 utilizing 3-0 chromic gut suture in a continuous interrupted suture technique 1.  At this point time the remaining dentition was approached. A gross debridement procedure was performed utilizing a sonic scaler. A series of hand curettes were then utilized to further remove accretions. A sonic scaler was then again used to further refine removal of accretions as indicated.    At this point time, the entire mouth was irrigated with copious amounts of sterile saline. The patient was examined for complications, seeing none, the dental medicine procedure was deemed to be complete. The throat pack was removed at this time. An oral airway was then placed at the request of the anesthesia team. A series of 4 x 4 gauze were placed in the mouth to aid hemostasis. The patient was then handed over to the anesthesia team for final disposition. After an appropriate amount of time, the patient was extubated and taken to the postanesthsia care unit in good condition. All counts were correct for the dental medicine procedure.   [name], DDS.

## 2016-09-09 NOTE — Anesthesia Preprocedure Evaluation (Signed)
Anesthesia Evaluation  Patient identified by MRN, date of birth, ID band Patient awake    Reviewed: Allergy & Precautions, NPO status , Patient's Chart, lab work & pertinent test results  Airway Mallampati: II  TM Distance: >3 FB Neck ROM: Full    Dental  (+) Poor Dentition, Dental Advisory Given   Pulmonary former smoker,    breath sounds clear to auscultation       Cardiovascular hypertension,  Rhythm:Regular Rate:Normal + Systolic murmurs    Neuro/Psych    GI/Hepatic   Endo/Other  diabetes  Renal/GU      Musculoskeletal   Abdominal   Peds  Hematology   Anesthesia Other Findings   Reproductive/Obstetrics                             Anesthesia Physical Anesthesia Plan  ASA: III  Anesthesia Plan: General   Post-op Pain Management:    Induction: Intravenous  Airway Management Planned: Nasal ETT  Additional Equipment:   Intra-op Plan:   Post-operative Plan: Extubation in OR  Informed Consent: I have reviewed the patients History and Physical, chart, labs and discussed the procedure including the risks, benefits and alternatives for the proposed anesthesia with the patient or authorized representative who has indicated his/her understanding and acceptance.   Dental advisory given  Plan Discussed with: CRNA and Anesthesiologist  Anesthesia Plan Comments:         Anesthesia Quick Evaluation

## 2016-09-09 NOTE — Discharge Instructions (Signed)
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
## 2016-09-09 NOTE — Anesthesia Postprocedure Evaluation (Addendum)
Anesthesia Post Note  Patient: Denise [name]  Procedure(s) Performed: Procedure(s) (LRB): MULTIPLE EXTRACTION WITH ALVEOLOPLASTY AND GROSS DEBRIDEMENT OF REMAINING TEETH (N/A)  Patient location during evaluation: PACU Anesthesia Type: General Level of consciousness: awake, awake and alert and oriented Pain management: pain level controlled Vital Signs Assessment: post-procedure vital signs reviewed and stable Respiratory status: spontaneous breathing, nonlabored ventilation and respiratory function stable Cardiovascular status: blood pressure returned to baseline Anesthetic complications: no       Last Vitals:  Vitals:   09/09/16 0929 09/09/16 0930  BP:  125/65  Pulse: 72 73  Resp: 13 16  Temp:  (!) 36 C    Last Pain:  Vitals:   09/09/16 0930  TempSrc:   PainSc: 0-No pain                 Aaron Boeh COKER

## 2016-09-09 NOTE — Progress Notes (Signed)
PRE-OPERATIVE NOTE:  09/09/2016 Denise [name] [id number]  VITALS: BP (!) 137/55   Pulse 69   Temp 98.1 F (36.7 C) (Oral)   Resp 20   SpO2 98%   Lab Results  Component Value Date   WBC 6.8 09/04/2016   HGB 12.7 09/04/2016   HCT 39.5 09/04/2016   MCV 80.6 09/04/2016   PLT 184 09/04/2016   BMET    Component Value Date/Time   NA 143 09/04/2016 1215   NA 149 (H) 08/22/2016 1232   NA 145 09/27/2015 0840   K 3.4 (L) 09/04/2016 1215   K 3.7 09/27/2015 0840   CL 107 09/04/2016 1215   CO2 27 09/04/2016 1215   CO2 27 09/27/2015 0840   GLUCOSE 106 (H) 09/04/2016 1215   GLUCOSE 136 09/27/2015 0840   BUN 13 09/04/2016 1215   BUN 11 08/22/2016 1232   BUN 8.8 09/27/2015 0840   CREATININE 0.63 09/04/2016 1215   CREATININE 0.8 09/27/2015 0840   CALCIUM 9.1 09/04/2016 1215   CALCIUM 9.2 09/27/2015 0840   GFRNONAA >60 09/04/2016 1215   GFRAA >60 09/04/2016 1215    Lab Results  Component Value Date   INR 1.0 08/22/2016   INR 1.02 11/15/2010   INR 0.96 10/04/2010   No results found for: PTT   Denise [name] presents for Multiple dental extractions with alveoloplasty and gross debridement of remaining dentition in the operating room with general anesthesia.   SUBJECTIVE: The patient denies any acute medical or dental changes and agrees to proceed with treatment as planned.  EXAM: No sign of acute dental changes.  ASSESSMENT: Patient is affected by chronic apical periodontitis, chronic periodontitis, loose teeth, accretions, and dental caries.  PLAN: Patient agrees to proceed with treatment as planned in the operating room as previously discussed and accepts the risks, benefits, and complications of the proposed treatment. Patient is aware of the risk for bleeding, bruising, swelling, infection, pain, nerve damage, soft tissue damage, damage to adjacent teeth, sinus involvement, root tip fracture, mandible fracture, and the risks of complications associated  with the anesthesia. Patient also is aware of the potential for other complications up to and including death due to overall cardiovascular and medical compromise.    [name], DDS

## 2016-09-10 ENCOUNTER — Encounter (HOSPITAL_COMMUNITY): Payer: Self-pay | Admitting: Dentistry

## 2016-09-13 ENCOUNTER — Other Ambulatory Visit: Payer: Self-pay | Admitting: Internal Medicine

## 2016-09-16 ENCOUNTER — Other Ambulatory Visit (HOSPITAL_COMMUNITY): Payer: Self-pay | Admitting: *Deleted

## 2016-09-18 ENCOUNTER — Encounter: Payer: Medicare Other | Admitting: Surgery

## 2016-09-18 ENCOUNTER — Encounter (HOSPITAL_COMMUNITY): Payer: Self-pay | Admitting: Dentistry

## 2016-09-18 ENCOUNTER — Ambulatory Visit (HOSPITAL_COMMUNITY): Payer: Self-pay | Admitting: Dentistry

## 2016-09-18 ENCOUNTER — Encounter: Payer: Self-pay | Admitting: Surgery

## 2016-09-18 ENCOUNTER — Institutional Professional Consult (permissible substitution) (INDEPENDENT_AMBULATORY_CARE_PROVIDER_SITE_OTHER): Payer: Medicare Other | Admitting: Surgery

## 2016-09-18 VITALS — BP 124/74 | HR 67 | Ht 61.0 in | Wt 224.0 lb

## 2016-09-18 VITALS — BP 130/58 | HR 66 | Temp 98.3°F

## 2016-09-18 DIAGNOSIS — K08409 Partial loss of teeth, unspecified cause, unspecified class: Secondary | ICD-10-CM

## 2016-09-18 DIAGNOSIS — M264 Malocclusion, unspecified: Secondary | ICD-10-CM

## 2016-09-18 DIAGNOSIS — K08199 Complete loss of teeth due to other specified cause, unspecified class: Secondary | ICD-10-CM

## 2016-09-18 DIAGNOSIS — K029 Dental caries, unspecified: Secondary | ICD-10-CM

## 2016-09-18 DIAGNOSIS — Z01818 Encounter for other preprocedural examination: Secondary | ICD-10-CM

## 2016-09-18 DIAGNOSIS — I350 Nonrheumatic aortic (valve) stenosis: Secondary | ICD-10-CM

## 2016-09-18 MED ORDER — CHLORHEXIDINE GLUCONATE 0.12 % MT SOLN: OROMUCOSAL | 99 refills | Status: DC

## 2016-09-18 NOTE — Progress Notes (Signed)
POST OPERATIVE NOTE:  09/18/2016 Denise [name] [id number]  VITALS: BP (!) 130/58 (BP Location: Left Arm)   Pulse 66   Temp 98.3 F (36.8 C) (Oral)   LABS:  Lab Results  Component Value Date   WBC 6.8 09/04/2016   HGB 12.7 09/04/2016   HCT 39.5 09/04/2016   MCV 80.6 09/04/2016   PLT 184 09/04/2016   BMET    Component Value Date/Time   NA 143 09/04/2016 1215   NA 149 (H) 08/22/2016 1232   NA 145 09/27/2015 0840   K 3.4 (L) 09/04/2016 1215   K 3.7 09/27/2015 0840   CL 107 09/04/2016 1215   CO2 27 09/04/2016 1215   CO2 27 09/27/2015 0840   GLUCOSE 106 (H) 09/04/2016 1215   GLUCOSE 136 09/27/2015 0840   BUN 13 09/04/2016 1215   BUN 11 08/22/2016 1232   BUN 8.8 09/27/2015 0840   CREATININE 0.63 09/04/2016 1215   CREATININE 0.8 09/27/2015 0840   CALCIUM 9.1 09/04/2016 1215   CALCIUM 9.2 09/27/2015 0840   GFRNONAA >60 09/04/2016 1215   GFRAA >60 09/04/2016 1215    Lab Results  Component Value Date   INR 1.0 08/22/2016   INR 1.02 11/15/2010   INR 0.96 10/04/2010   No results found for: PTT   Denise [name] is status post multiple extractions with alveoloplasty and gross debridement of remaining dentition in the operating room with general anesthesia on 09/09/2016. Patient now presents for evaluation of healing and suture removal as needed. We also discussed the pathology results from the submission of periapical contents extraction site #7.   SUBJECTIVE: Patient denies having any persistent discomfort. Patient denies any swelling or fever or sinus problems. Patient will see Dr. Cyndia Bent later this afternoon for consultation concerning anticipated TAVR procedure.  EXAM: There is no sign of infection, heme, or ooze. Sutures are loosely intact. Patient is healing in by generalized primary closure. Dental caries as charted previously. Multiple missing teeth and malocclusion as before.  PROCEDURE: The patient was given a chlorhexidine gluconate rinse for  30 seconds. Sutures were then removed without complication. Patient tolerated the procedure well.  ASSESSMENT: Post operative course is consistent with dental procedures performed in the operating room with general anesthesia. Loss of teeth due to extraction Dental caries Multiple missing teeth Malocclusion  PLAN: 1. Start using chlorhexidine rinses twice daily as prescribed. Prescription was sent to [facility].  2. Continue salt water rinses as needed in between the chlorhexidine rinses to aid healing 3 Brush teeth after meals and at bedtime. Floss at bedtime.  4. Advance diet as tolerated. 5. Return to clinic in 4-6 weeks for evaluation of healing in the extraction site area #7. Consider referral to an oral surgeon or ENT as needed. 6. The patient is currently cleared for TAVR procedure.  Denise [name], DDS

## 2016-09-18 NOTE — Patient Instructions (Addendum)
PLAN: 1. Start using chlorhexidine rinses twice daily as prescribed. Prescription was sent to [facility].  2. Continue salt water rinses as needed in between the chlorhexidine rinses to aid healing 3 Brush teeth after meals and at bedtime. Floss at bedtime.  4. Advance diet as tolerated. 5. Return to clinic in 4-6 weeks for evaluation of healing in the extraction site area #7. Consider referral to an oral surgeon or ENT as needed. 6. The patient is currently cleared for TAVR procedure.  [name], DDS

## 2016-09-19 ENCOUNTER — Encounter: Payer: Self-pay | Admitting: Surgery

## 2016-09-19 ENCOUNTER — Ambulatory Visit (HOSPITAL_COMMUNITY): Payer: Self-pay | Admitting: Dentistry

## 2016-09-19 NOTE — Progress Notes (Signed)
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 collimation was .6 mm. No beta blockade or nitro were given. The 3D data set was reconstructed in 10% intervals of the R-R cycle. Systolic and diastolic phases were analyzed on a dedicated work station using MPR, MIP and VRT modes. The patient received 80 cc of contrast.  FINDINGS: Aortic Valve:  Tri-leaflet calcified with restricted motion  Aorta: Moderate atherosclerosis and calcification of the Arch and descending thoracic aorta  LAA:  No Thrombus  Sinotubular Junction:  2.3 cm  Ascending Thoracic Aorta:  3.0 cm  Aortic Arch:  2.6 cm  Descending Thoracic Aorta:  2.7 cm  Sinus of Valsalva Measurements:  Non-coronary:  2.5 cm  Right - coronary:   2.5 cm  Left - coronary:  2.6 cm  Coronary Artery Height above Annulus:  Left Main:  9.5 mm above annulus  Right Coronary:  11 mm above annulus  Virtual Basal Annulus Measurements:  Maximum/Minimum Diameter:  25 mm x 21.8 mm  Perimeter:  74 mm  Area:  436 mm2  Coronary Arteries:  Sufficient height above annulus for deployment  Optimum Fluoroscopic Angle for Delivery:  LAO 26 Caudal 6 degrees  IMPRESSION: 1) Tri-leaflet aortic valve with annulus suitable for an under inflated 26 mm Sapien 3 valve annular area 436 mm2  2) Optimum angiographic angle for deployment LAO 26 Caudal 6 degrees  3) No LAA thrombus  4) Coronary arteries sufficient height above annulus for deployment  5) Moderate calcification and atherosclerosis of arch and descending thoracic aorta with no aneurysm root measures 3.0 cm  Jenkins Rouge   Electronically Signed   By: Jenkins Rouge M.D.   On: 08/29/2016 12:13   Addended by Josue Hector, MD on 08/29/2016 12:15 PM    Study Result   EXAM: OVER-READ INTERPRETATION  CT CHEST  The following report is an over-read performed by radiologist Dr. Alvino Blood [facility], [place] on 08/29/2016. This over-read does not include interpretation of cardiac or coronary anatomy or pathology. The coronary CTA interpretation by the cardiologist is attached.  COMPARISON:  None.  FINDINGS: Limited view of the lung parenchyma demonstrates no suspicious nodularity. Mild linear atelectasis in the lingula. Airways are normal.  Limited view of the mediastinum demonstrates no adenopathy. Esophagus normal.  Limited view of the upper abdomen unremarkable. Simple cyst upper pole of the LEFT kidney.  Limited view of the skeleton and chest wall demonstrates a 6.5 cm seroma within the lateral RIGHT breast.  IMPRESSION: 1. Lungs are clear. 2. RIGHT breast seroma.  Electronically  Signed: By: Suzy Bouchard M.D. On:  08/29/2016 10:33        CLINICAL DATA:  71 year old female with history of severe aortic stenosis. Preprocedural study prior to potential transcatheter aortic valve replacement.  EXAM: CT ANGIOGRAPHY CHEST, ABDOMEN AND PELVIS  TECHNIQUE: Multidetector CT imaging through the chest, abdomen and pelvis was performed using the standard protocol during bolus administration of intravenous contrast. Multiplanar reconstructed images and MIPs were obtained and reviewed to evaluate the vascular anatomy.  CONTRAST:  75 mL of Isovue 370.  COMPARISON:  None.  FINDINGS: CTA CHEST FINDINGS  Cardiovascular: Heart size is mildly enlarged. There is no significant pericardial fluid, thickening or pericardial calcification. There is aortic atherosclerosis, as well as atherosclerosis of the great vessels of the mediastinum and the coronary arteries, including calcified atherosclerotic plaque in the left anterior descending and right coronary arteries. Thickening calcification of the aortic valve.  Mediastinum/Lymph Nodes: No pathologically enlarged mediastinal or hilar lymph nodes. Esophagus is unremarkable in appearance. No axillary lymphadenopathy.  Lungs/Pleura: No suspicious appearing pulmonary nodules or masses. No acute consolidative airspace disease. No pleural effusions. Scattered areas of linear scarring are noted in the inferior segment of the lingula and in the right middle lobe.  Musculoskeletal/Soft Tissues: Postoperative changes of prior lumpectomy are noted in the right breast. There is a large postoperative fluid collection measuring 7.7 x 6.5 cm which is centrally low-attenuation and has some peripheral calcifications, presumably a chronic postoperative seroma. Extensive skin thickening throughout the right breast may reflect changes from prior surgery and/or radiation therapy. There are no aggressive appearing lytic or blastic lesions noted in the visualized  portions of the skeleton.  CTA ABDOMEN AND PELVIS FINDINGS  Hepatobiliary: No cystic or solid hepatic lesions. No intra or extrahepatic biliary ductal dilatation. Gallbladder is normal in appearance.  Pancreas: No pancreatic mass. No pancreatic ductal dilatation. No pancreatic or peripancreatic fluid or inflammatory changes.  Spleen: Unremarkable.  Adrenals/Urinary Tract: 3.4 cm low-attenuation lesion in the upper pole of the left kidney is compatible with a simple cyst. Right kidney and bilateral adrenal glands are normal in appearance. No hydroureteronephrosis. Urinary bladder is normal in appearance.  Stomach/Bowel: The appearance of the stomach is normal. There is no pathologic dilatation of small bowel or colon. Numerous colonic diverticulae are noted, particularly in the sigmoid colon, without surrounding inflammatory changes to suggest an acute diverticulitis at this time. Normal appendix.  Vascular/Lymphatic: Aortic atherosclerosis, with vascular findings and measurements pertinent to potential TAVR procedure, as detailed below. No aneurysm or dissection noted in the abdominal or pelvic vasculature. Celiac axis, superior mesenteric artery and inferior mesenteric artery are all widely patent, without hemodynamically significant stenosis. Single renal arteries bilaterally are widely patent. No lymphadenopathy noted in the abdomen or pelvis.  Reproductive: Uterus and ovaries are unremarkable in appearance.  Other: No significant volume of ascites.  No pneumoperitoneum.  Musculoskeletal: Status post PLIF at L2-L3. Interbody grafts are noted at L3-L4, L4-L5 and L5-S1. Interbody cage at L2-L3. There are no aggressive appearing lytic or blastic lesions noted in the visualized portions of the skeleton.  VASCULAR MEASUREMENTS PERTINENT TO TAVR:  AORTA:  Minimal Aortic Diameter -  16 x 16 mm  Severity of Aortic Calcification -  mild  RIGHT  PELVIS:  Right Common Iliac Artery -  Minimal Diameter - 7.9 x 9.8 mm  Tortuosity - mild  Calcification - mild  Right External Iliac Artery -  Minimal Diameter - 7.9 x 7.4 mm  Tortuosity - moderate  Calcification -  none  Right Common Femoral Artery -  Minimal Diameter - 7.6 x 8.0 mm  Tortuosity - mild  Calcification - none  LEFT PELVIS:  Left Common Iliac Artery -  Minimal Diameter - 11.5 x 10.6 mm  Tortuosity - mild  Calcification - mild  Left External Iliac Artery -  Minimal Diameter - 8.6 x 8.4 mm  Tortuosity - moderate  Calcification - none  Left Common Femoral Artery -  Minimal Diameter - 8.0 x 7.2 mm  Tortuosity - mild  Calcification - none  Review of the MIP images confirms the above findings.  IMPRESSION: 1. Vascular findings and measurements pertinent to potential TAVR procedure, as detailed above. The patient does appear to have suitable pelvic arterial access bilaterally. 2. Severe thickening calcification of the aortic valve, compatible with the reported clinical history of severe aortic stenosis. 3. Aortic atherosclerosis, in addition to 2 vessel coronary artery disease. Assessment for potential risk factor modification, dietary therapy or pharmacologic therapy may be warranted, if clinically indicated. 4. Postoperative changes in the right breast related to prior lumpectomy with postoperative fluid collection, presumably a chronic seroma, and extensive skin thickening throughout the right breast which may reflect prior radiation therapy. 5. No definite findings to suggest metastatic disease in the chest, abdomen or pelvis. 6. Colonic diverticulosis without evidence of acute diverticulitis at this time. 7. Additional incidental findings, as above.   Electronically Signed   By: Vinnie Langton M.D.   On: 08/30/2016 12:56   STS Risk Calculator  Procedure                                           AVR  Risk of Mortality                                3.28% Morbidity or Mortality                       23.0% Prolonged LOS                                   12.7% Short LOS                                           20.3% Permanent Stroke                             1.5% Prolonged Vent Support                     17.2% DSW Infection                                     0.6% Renal Failure                                       7.8% Reoperation  8.3%   Impression:   This 71 year old woman has stage D severe symptomatic aortic stenosis with a history of progressive exertional fatigue and shortness of breath consistent with chronic diastolic congestive heart failure, New York Heart Association functional class II. I have personally reviewed the patient's recent transthoracic echocardiogram, cardiac catheterization, and CT angiograms. Echocardiogram reveals the patient's aortic valve is trileaflet with severe thickening and calcification of all 3 leaflets. There is severely restricted leaflet mobility a mean transvalvular gradient of 48 mmHg.  There is moderate left ventricular hypertrophy with significant diastolic dysfunction but left ventricular systolic function remains normal.Cardiac catheterization shows only nonobstructive coronary artery disease. I agree that AVR is indicated in this patient for symptom relief and to prevent progressive LV deterioration. 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  Under the circumstances I think TAVR is a good alternative to conventional surgery.  Cardiac CT angiogram of the heart reveals findings consistent with severe aortic stenosis with anatomical characteristics favorable for  transcatheter aortic valve replacement without any complicating features. CTA of the abdomen and pelvis demonstrates adequate pelvic vascular access to allow a transfemoral approach.  The patient was counseled at length regarding treatment alternatives for management of severe symptomatic aortic stenosis. The risks and benefits of surgical intervention has been discussed in detail. Long-term prognosis with medical therapy was discussed. Alternative approaches such as conventional surgical aortic valve replacement, transcatheter aortic valve replacement, and palliative medical therapy were compared and contrasted at length. This discussion was placed in the context of the patient's own specific clinical presentation and past medical history. All of their questions been addressed. The patient is eager to proceed with surgical management as soon as possible.   Following the decision to proceed with transcatheter aortic valve replacement, a discussion was held regarding what types of management strategies would be attempted intraoperatively in the event of life-threatening complications, including whether or not the patient would be considered a candidate for the use of cardiopulmonary bypass and/or conversion to open sternotomy for attempted surgical intervention.   The patient has been advised of a variety of complications that might develop including but not limited to risks of death, stroke, paravalvular leak, aortic dissection or other major vascular complications, aortic annulus rupture, device embolization, cardiac rupture or perforation, mitral regurgitation, acute myocardial infarction, arrhythmia, heart block or bradycardia requiring permanent pacemaker placement, congestive heart failure, respiratory failure, renal failure, pneumonia, infection, other late complications related to structural valve deterioration or migration, or other complications that might ultimately cause a temporary or permanent  loss of functional independence or other long term morbidity. The patient provides full informed consent for the procedure as described and all questions were answered.   Plan:  She will be scheduled for transfemoral TAVR with Dr. Roxy Manns and Dr. Burt Knack on 09/24/2016.   I spent 60 minutes performing this consultation and > 50% of this time was spent face to face counseling and coordinating the care of this patient's severe aortic stenosis.   Gaye Pollack, MD 09/18/2016

## 2016-09-20 ENCOUNTER — Encounter (HOSPITAL_COMMUNITY): Payer: Self-pay

## 2016-09-20 ENCOUNTER — Encounter (HOSPITAL_COMMUNITY)
Admission: RE | Admit: 2016-09-20 | Discharge: 2016-09-20 | Disposition: A | Discharge status: Home / Self Care | Payer: Medicare Other | Source: Ambulatory Visit | Attending: Cardiovascular Disease | Admitting: Cardiovascular Disease

## 2016-09-20 ENCOUNTER — Ambulatory Visit (HOSPITAL_COMMUNITY)
Admission: RE | Admit: 2016-09-20 | Discharge: 2016-09-20 | Disposition: A | Discharge status: Home / Self Care | Payer: Medicare Other | Source: Ambulatory Visit | Attending: Cardiovascular Disease | Admitting: Cardiovascular Disease

## 2016-09-20 ENCOUNTER — Other Ambulatory Visit (HOSPITAL_COMMUNITY): Payer: Self-pay | Admitting: Urology

## 2016-09-20 DIAGNOSIS — Z7982 Long term (current) use of aspirin: Secondary | ICD-10-CM | POA: Diagnosis not present

## 2016-09-20 DIAGNOSIS — Z79899 Other long term (current) drug therapy: Secondary | ICD-10-CM | POA: Diagnosis not present

## 2016-09-20 DIAGNOSIS — E119 Type 2 diabetes mellitus without complications: Secondary | ICD-10-CM | POA: Insufficient documentation

## 2016-09-20 DIAGNOSIS — Z01812 Encounter for preprocedural laboratory examination: Secondary | ICD-10-CM | POA: Diagnosis not present

## 2016-09-20 DIAGNOSIS — Z6841 Body Mass Index (BMI) 40.0 and over, adult: Secondary | ICD-10-CM | POA: Insufficient documentation

## 2016-09-20 DIAGNOSIS — Z01810 Encounter for preprocedural cardiovascular examination: Secondary | ICD-10-CM | POA: Diagnosis not present

## 2016-09-20 DIAGNOSIS — I10 Essential (primary) hypertension: Secondary | ICD-10-CM | POA: Diagnosis not present

## 2016-09-20 DIAGNOSIS — I700 Atherosclerosis of aorta: Secondary | ICD-10-CM | POA: Insufficient documentation

## 2016-09-20 DIAGNOSIS — Z7951 Long term (current) use of inhaled steroids: Secondary | ICD-10-CM | POA: Insufficient documentation

## 2016-09-20 DIAGNOSIS — E7800 Pure hypercholesterolemia, unspecified: Secondary | ICD-10-CM | POA: Insufficient documentation

## 2016-09-20 DIAGNOSIS — I350 Nonrheumatic aortic (valve) stenosis: Secondary | ICD-10-CM

## 2016-09-20 DIAGNOSIS — Z01818 Encounter for other preprocedural examination: Secondary | ICD-10-CM | POA: Insufficient documentation

## 2016-09-20 DIAGNOSIS — J45909 Unspecified asthma, uncomplicated: Secondary | ICD-10-CM | POA: Diagnosis not present

## 2016-09-20 DIAGNOSIS — Z952 Presence of prosthetic heart valve: Secondary | ICD-10-CM | POA: Diagnosis not present

## 2016-09-20 LAB — CBC
HCT: 39.2 % (ref 36.0–46.0)
Hemoglobin: 12.8 g/dL (ref 12.0–15.0)
MCH: 26.3 pg (ref 26.0–34.0)
MCHC: 32.7 g/dL (ref 30.0–36.0)
MCV: 80.7 fL (ref 78.0–100.0)
PLATELETS: 193 10*3/uL (ref 150–400)
RBC: 4.86 MIL/uL (ref 3.87–5.11)
RDW: 14.9 % (ref 11.5–15.5)
WBC: 8.6 10*3/uL (ref 4.0–10.5)

## 2016-09-20 LAB — URINALYSIS, ROUTINE W REFLEX MICROSCOPIC
Bilirubin Urine: NEGATIVE
GLUCOSE, UA: NEGATIVE mg/dL
Hgb urine dipstick: NEGATIVE
KETONES UR: NEGATIVE mg/dL
LEUKOCYTES UA: NEGATIVE
NITRITE: NEGATIVE
PH: 6 (ref 5.0–8.0)
PROTEIN: NEGATIVE mg/dL
Specific Gravity, Urine: 1.009 (ref 1.005–1.030)

## 2016-09-20 LAB — APTT: APTT: 31 s (ref 24–36)

## 2016-09-20 LAB — COMPREHENSIVE METABOLIC PANEL
ALBUMIN: 4.1 g/dL (ref 3.5–5.0)
ALT: 21 U/L (ref 14–54)
AST: 23 U/L (ref 15–41)
Alkaline Phosphatase: 62 U/L (ref 38–126)
Anion gap: 10 (ref 5–15)
BUN: 10 mg/dL (ref 6–20)
CHLORIDE: 107 mmol/L (ref 101–111)
CO2: 22 mmol/L (ref 22–32)
CREATININE: 0.62 mg/dL (ref 0.44–1.00)
Calcium: 9.4 mg/dL (ref 8.9–10.3)
GFR calc Af Amer: >60 mL/min (ref >60)
Glucose, Bld: 96 mg/dL (ref 65–99)
POTASSIUM: 3.6 mmol/L (ref 3.5–5.1)
SODIUM: 139 mmol/L (ref 135–145)
Total Bilirubin: 0.7 mg/dL (ref 0.3–1.2)
Total Protein: 7.1 g/dL (ref 6.5–8.1)

## 2016-09-20 LAB — TYPE AND SCREEN
ABO/RH(D): A POS
ANTIBODY SCREEN: NEGATIVE

## 2016-09-20 LAB — PROTIME-INR
INR: 1.01
PROTHROMBIN TIME: 13.3 s (ref 11.4–15.2)

## 2016-09-20 LAB — BLOOD GAS, ARTERIAL
Acid-Base Excess: 1.6 mmol/L (ref 0.0–2.0)
Bicarbonate: 25.5 mmol/L (ref 20.0–28.0)
DRAWN BY: 470591
O2 SAT: 97.4 %
PATIENT TEMPERATURE: 98.6
PH ART: 7.43 (ref 7.350–7.450)
PO2 ART: 98.1 mmHg (ref 83.0–108.0)
pCO2 arterial: 39.1 mmHg (ref 32.0–48.0)

## 2016-09-20 LAB — SURGICAL PCR SCREEN
MRSA, PCR: POSITIVE — AB
STAPHYLOCOCCUS AUREUS: POSITIVE — AB

## 2016-09-20 LAB — GLUCOSE, CAPILLARY: Glucose-Capillary: 100 mg/dL — ABNORMAL HIGH (ref 65–99)

## 2016-09-20 NOTE — Progress Notes (Signed)
Denise [name]            09/20/2016                                     Your procedure is scheduled on Tuesday May 29.            Report to [facility] Admitting at 5:30 A.M.            Call this number if you have problems the morning of surgery:            [phone]             Remember:            Do not eat food or drink liquids after midnight.            Take these medicines the morning of surgery with A SIP OF WATER: amlodipine (norvasc), anastrozole (arimidex), zyrte, percocet (oxycodone), flonase, symbicort, albuterol inhaler if needed (please bring inhaler to hospital with you)   7 days prior to surgery STOP taking any Aspirin, Aleve, Naproxen, Ibuprofen, Motrin, Advil, Goody's, BC's, all herbal medications, fish oil, and all vitamins     How to Manage Your Diabetes Before and After Surgery  Why is it important to control my blood sugar before and after surgery? . Improving blood sugar levels before and after surgery helps healing and can limit problems. . A way of improving blood sugar control is eating a healthy diet by: o  Eating less sugar and carbohydrates o  Increasing activity/exercise o  Talking with your doctor about reaching your blood sugar goals . High blood sugars (greater than 180 mg/dL) can raise your risk of infections and slow your recovery, so you will need to focus on controlling your diabetes during the weeks before surgery. . Make sure that the doctor who takes care of your diabetes knows about your planned surgery including the date and location.  How do I manage my blood sugar before surgery? . Check your blood sugar at least 4 times a day, starting 2 days before surgery, to make sure that the level is not too high or low. o Check your blood sugar the morning of your surgery when you wake up and every 2 hours until you get to the Short Stay unit. . If your blood sugar is less than 70 mg/dL, you will need to treat  for low blood sugar: o Do not take insulin. o Treat a low blood sugar (less than 70 mg/dL) with  cup of clear juice (cranberry or apple), 4 glucose tablets, OR glucose gel. o Recheck blood sugar in 15 minutes after treatment (to make sure it is greater than 70 mg/dL). If your blood sugar is not greater than 70 mg/dL on recheck, call [phone] for further instructions. . Report your blood sugar to the short stay nurse when you get to Short Stay.  . If you are admitted to the hospital after surgery: o Your blood sugar will be checked by the staff and you will probably be given insulin after surgery (instead of oral diabetes medicines) to make sure you have good blood sugar levels. o The goal for blood sugar control after surgery is 80-180 mg/dL.              WHAT DO I DO ABOUT  MY DIABETES MEDICATION?   Marland Kitchen Do not take oral diabetes medicines (pills) the morning of surgery. DO NOT TAKE metformin (glucophage), glimepiride (Amaryl) the day of surgery.  . The day before surgery take only morning and/or lunch doses of glimepiride (Amaryl)  . The day of surgery, do not take other diabetes injectables, including Byetta (exenatide), Bydureon (exenatide ER), Victoza (liraglutide), or Trulicity (dulaglutide).  . If your CBG is greater than 220 mg/dL, you may take  of your sliding scale (correction) dose of insulin.              Do not wear jewelry, make-up or nail polish.            Do not wear lotions, powders, or perfumes, or deoderant.            Do not shave 48 hours prior to surgery.  Men may shave face and neck.            Do not bring valuables to the hospital.            [facility] is not responsible for any belongings or valuables.  Contacts, dentures or bridgework may not be worn into surgery.  Leave your suitcase in the car.  After surgery it may be brought to your room.  For patients admitted to the hospital, discharge time will be determined by your treatment  team.  Patients discharged the day of surgery will not be allowed to drive home.   Special instructions:    [facility]- Preparing For Surgery  Before surgery, you can play an important role. Because skin is not sterile, your skin needs to be as free of germs as possible. You can reduce the number of germs on your skin by washing with CHG (chlorahexidine gluconate) Soap before surgery.  CHG is an antiseptic cleaner which kills germs and bonds with the skin to continue killing germs even after washing.  Please do not use if you have an allergy to CHG or antibacterial soaps. If your skin becomes reddened/irritated stop using the CHG.  Do not shave (including legs and underarms) for at least 48 hours prior to first CHG shower. It is OK to shave your face.  Please follow these instructions carefully.                                                                                                                     1. Shower the NIGHT BEFORE SURGERY and the MORNING OF SURGERY with CHG.   2. If you chose to wash your hair, wash your hair first as usual with your normal shampoo.  3. After you shampoo, rinse your hair and body thoroughly to remove the shampoo.  4. Use CHG as you would any other liquid soap. You can apply CHG directly to the skin and wash gently with a scrungie or a clean washcloth.   5. Apply the CHG Soap to your body ONLY FROM THE NECK DOWN.  Do not use on open wounds  or open sores. Avoid contact with your eyes, ears, mouth and genitals (private parts). Wash genitals (private parts) with your normal soap.  6. Wash thoroughly, paying special attention to the area where your surgery will be performed.  7. Thoroughly rinse your body with warm water from the neck down.  8. DO NOT shower/wash with your normal soap after using and rinsing off the CHG Soap.  9. Pat yourself dry with a CLEAN TOWEL.   10. Wear CLEAN PAJAMAS   11. Place CLEAN SHEETS on your bed the  night of your first shower and DO NOT SLEEP WITH PETS.    Day of Surgery: Do not apply any deodorants/lotions. Please wear clean clothes to the hospital/surgery center.      Please read over the following fact sheets that you were given. Coughing and Deep Breathing and MRSA Information

## 2016-09-20 NOTE — Progress Notes (Signed)
PCR positive for MRSA, pt notified and Dr. Antionette Char office notified. Prescription called to pharmacy.

## 2016-09-20 NOTE — Progress Notes (Addendum)
PCP - Cathlean Cower Cardiologist - saw Dr Skeet Latch who referred to Dr. Burt Knack per pt  Chest x-ray - 09/20/2016  EKG - 09/20/2016 ECHO - 07/18/16 Cardiac Cath - 09/04/16   Fasting Blood Sugar - 80-100 Pt states she was told by Surgeon that it is okay to keep taking Aspirin prior to surgery   Patient denies shortness of breath, fever, cough and chest pain at PAT appointment. Pt reports no medical history changes since she came for dental surgery on 09/09/16.    Patient verbalized understanding of instructions that were given to them at the PAT appointment. Patient was also instructed that they will need to review over the PAT instructions again at home before surgery.

## 2016-09-20 NOTE — Progress Notes (Signed)
Anesthesia Chart Review: Patient is a 71 year old female scheduled for TAVR, transfemoral on 09/24/16 by Dr. Sherren Mocha and Dr. Darylene Price. She is s/p multiple teeth extraction with alveoloplasty and gross debridement of remaining teeth 09/09/16.  History includes severe AS, former smoker, diabetes mellitus type 2, hypercholesterolemia, anxiety, depression, hypertension, asthma, eczema, GERD, right breast cancer s/p right breast lumpectomy and SN biopsy 10/17/15 and radiation, partial thyroidectomy, left TKA 10/09/10, right TKA 11/20/10, L2-3 posterolateral arthrodesis 08/22/14, L4-5 PLIF 04/06/02. BMI is consistent with morbid obesity.  PCP is Dr. Cathlean Cower.  Cardiologist is with [facility]. She saw Dr. Skeet Latch on 08/19/16 and was then referred to Dr. Sherren Mocha due to severe AS and patient's interest in TAVR. HEM-ONC is Dr. Nicholas Lose.  Meds include albuterol, amlodipine, anastrozole, aspirin 81 mg, Lipitor, Symbicort, Zyrtec, Flonase, Lasix, Amaryl, lisinopril, metformin, fish oil, KCl, Zanaflex.  BP 128/66   Pulse 68   Temp 36.9 C (Oral)   Resp 18   Ht 5\' 1"  (1.549 m)   Wt 226 lb 13.7 oz (102.9 kg)   SpO2 97%   BMI 42.86 kg/m   EKG 09/20/16: SB at 56 bpm.  Cardiac cath 09/04/16:  1. The coronary arteries are widely patent with diffuse calcification and minor nonobstructive disease with coronary irregularity 2. Known severe calcific aortic stenosis with calcification and restriction of the aortic valve leaflets 3. Normal right heart hemodynamics Discussion: The patient will continue evaluation for treatment of her severe aortic stenosis by the multidisciplinary heart team. Note by aortic root angiography, the aortic root appears relatively small in size.  Echo 07/18/16: Study Conclusions - Left ventricle: The cavity size was normal. There was moderate concentric hypertrophy. Systolic function was normal. The estimated ejection fraction was in the range of  55% to 60%. Wall motion was normal; there were no regional wall motion abnormalities. Doppler parameters are consistent with abnormal left ventricular relaxation (grade 1 diastolic dysfunction). Doppler parameters are consistent with elevated mean left atrial filling pressure. - Aortic valve: There was severe stenosis. There was trivial regurgitation. - Mitral valve: Calcified annulus. - Left atrium: The atrium was moderately dilated.  Carotid U/S 09/05/16: Summary: - The vertebral arteries appear patent with antegrade flow. - Findings consistent with a 1-39 percent stenosis involving the   right internal carotid artery and the left internal carotid   artery. - Prominent right thyroid lobe, goiter.  Preoperative cardiac CT, CTA chest/abd/pelvis, and CXR noted.   PFTs 08/29/16: FVC 1.94 (96%), FEV1 1.56 (101%), DLCO unc 16.36 (80%).  Preoperative labs noted. A1c 5.8 on 09/04/16. UA negative leukocytes, nitrites.  If no acute changes then I anticipate that she can proceed as planned.  [facility] Short Stay Center/Anesthesiology Phone [phone] 09/20/2016 2:00 PM

## 2016-09-21 LAB — HEMOGLOBIN A1C
Hgb A1c MFr Bld: 5.7 % — ABNORMAL HIGH (ref 4.8–5.6)
MEAN PLASMA GLUCOSE: 117 mg/dL

## 2016-09-23 MED ORDER — CHLORHEXIDINE GLUCONATE 0.12 % MT SOLN: 15.0000 mL | Freq: Once | OROMUCOSAL | Status: DC

## 2016-09-23 MED ORDER — VANCOMYCIN HCL 10 G IV SOLR
1500.0000 mg | INTRAVENOUS | Status: AC
  Administered 2016-09-24: 1500 mg via INTRAVENOUS
  Filled 2016-09-23: qty 1500

## 2016-09-23 MED ORDER — SODIUM CHLORIDE 0.9 % IV SOLN
INTRAVENOUS | Status: DC
  Filled 2016-09-23: qty 30

## 2016-09-23 MED ORDER — SODIUM CHLORIDE 0.9 % IV SOLN: INTRAVENOUS | Status: DC

## 2016-09-23 MED ORDER — NITROGLYCERIN IN D5W 200-5 MCG/ML-% IV SOLN
2.0000 ug/min | INTRAVENOUS | Status: DC
  Filled 2016-09-23: qty 250

## 2016-09-23 MED ORDER — DEXTROSE 5 % IV SOLN
1.5000 g | INTRAVENOUS | Status: AC
  Administered 2016-09-24: 1.5 g via INTRAVENOUS
  Filled 2016-09-23: qty 1.5

## 2016-09-23 MED ORDER — DOPAMINE-DEXTROSE 3.2-5 MG/ML-% IV SOLN
0.0000 ug/kg/min | INTRAVENOUS | Status: DC
Start: 2016-09-24 — End: 2016-09-24
  Filled 2016-09-23: qty 250

## 2016-09-23 MED ORDER — SODIUM CHLORIDE 0.9 % IV SOLN
INTRAVENOUS | Status: DC
  Filled 2016-09-23: qty 1

## 2016-09-23 MED ORDER — SODIUM CHLORIDE 0.9 % IV SOLN
30.0000 ug/min | INTRAVENOUS | Status: DC
  Filled 2016-09-23: qty 2

## 2016-09-23 MED ORDER — EPINEPHRINE PF 1 MG/ML IJ SOLN
0.0000 ug/min | INTRAVENOUS | Status: DC
  Filled 2016-09-23: qty 4

## 2016-09-23 MED ORDER — DEXMEDETOMIDINE HCL IN NACL 400 MCG/100ML IV SOLN
0.1000 ug/kg/h | INTRAVENOUS | Status: AC
  Administered 2016-09-24: .2 ug/kg/h via INTRAVENOUS
  Filled 2016-09-23: qty 100

## 2016-09-23 MED ORDER — MAGNESIUM SULFATE 50 % IJ SOLN
40.0000 meq | INTRAMUSCULAR | Status: DC
  Filled 2016-09-23: qty 10

## 2016-09-23 MED ORDER — POTASSIUM CHLORIDE 2 MEQ/ML IV SOLN
80.0000 meq | INTRAVENOUS | Status: DC
  Filled 2016-09-23: qty 40

## 2016-09-23 MED ORDER — DEXTROSE 5 % IV SOLN
0.0000 ug/min | INTRAVENOUS | Status: DC
Start: 2016-09-24 — End: 2016-09-24
  Filled 2016-09-23: qty 4

## 2016-09-23 NOTE — Anesthesia Preprocedure Evaluation (Addendum)
Anesthesia Evaluation  Patient identified by MRN, date of birth, ID band Patient awake    Reviewed: Allergy & Precautions, NPO status , Patient's Chart, lab work & pertinent test results  Airway Mallampati: II  TM Distance: >3 FB Neck ROM: Full   Comment: Prior  intubation 08/11/16 Grade 1 view Dental  (+) Missing, Poor Dentition, Dental Advisory Given   Pulmonary asthma , former smoker,    breath sounds clear to auscultation       Cardiovascular hypertension, Pt. on medications + Valvular Problems/Murmurs AS  Rhythm:Regular Rate:Normal + Systolic murmurs    Neuro/Psych Anxiety Depression    GI/Hepatic   Endo/Other  diabetes, Type 2, Oral Hypoglycemic Agents  Renal/GU      Musculoskeletal  (+) Arthritis ,   Abdominal   Peds  Hematology   Anesthesia Other Findings Echo 3/18 EF 55-60% Severe AS with trivial regurgitation   Reproductive/Obstetrics                           Anesthesia Physical Anesthesia Plan  ASA: IV  Anesthesia Plan: General   Post-op Pain Management:    Induction: Intravenous  Airway Management Planned: Oral ETT  Additional Equipment: PA Cath and Ultrasound Guidance Line Placement  Intra-op Plan:   Post-operative Plan: Extubation in OR  Informed Consent: I have reviewed the patients History and Physical, chart, labs and discussed the procedure including the risks, benefits and alternatives for the proposed anesthesia with the patient or authorized representative who has indicated his/her understanding and acceptance.   Dental advisory given  Plan Discussed with:   Anesthesia Plan Comments:         Anesthesia Quick Evaluation

## 2016-09-24 ENCOUNTER — Encounter (HOSPITAL_COMMUNITY)
Admission: RE | Disposition: A | Discharge status: Home / Self Care | Payer: Self-pay | Source: Ambulatory Visit | Attending: Thoracic Surgery (Cardiothoracic Vascular Surgery)

## 2016-09-24 ENCOUNTER — Inpatient Hospital Stay (HOSPITAL_COMMUNITY)
Admission: RE | Admit: 2016-09-24 | Discharge: 2016-09-26 | DRG: 267 | Disposition: A | Discharge status: Home / Self Care | Payer: Medicare Other | Source: Ambulatory Visit | Attending: Thoracic Surgery (Cardiothoracic Vascular Surgery) | Admitting: Thoracic Surgery (Cardiothoracic Vascular Surgery)

## 2016-09-24 ENCOUNTER — Inpatient Hospital Stay (HOSPITAL_COMMUNITY): Payer: Medicare Other

## 2016-09-24 ENCOUNTER — Inpatient Hospital Stay (HOSPITAL_COMMUNITY): Payer: Medicare Other | Admitting: Emergency Medicine

## 2016-09-24 ENCOUNTER — Other Ambulatory Visit: Payer: Self-pay

## 2016-09-24 ENCOUNTER — Inpatient Hospital Stay (HOSPITAL_COMMUNITY): Payer: Medicare Other | Admitting: Anesthesiology

## 2016-09-24 ENCOUNTER — Encounter (HOSPITAL_COMMUNITY): Payer: Self-pay | Admitting: *Deleted

## 2016-09-24 DIAGNOSIS — Z888 Allergy status to other drugs, medicaments and biological substances status: Secondary | ICD-10-CM | POA: Diagnosis not present

## 2016-09-24 DIAGNOSIS — E7800 Pure hypercholesterolemia, unspecified: Secondary | ICD-10-CM | POA: Diagnosis not present

## 2016-09-24 DIAGNOSIS — I10 Essential (primary) hypertension: Secondary | ICD-10-CM | POA: Diagnosis present

## 2016-09-24 DIAGNOSIS — E785 Hyperlipidemia, unspecified: Secondary | ICD-10-CM | POA: Diagnosis present

## 2016-09-24 DIAGNOSIS — Z7409 Other reduced mobility: Secondary | ICD-10-CM | POA: Diagnosis present

## 2016-09-24 DIAGNOSIS — Z91018 Allergy to other foods: Secondary | ICD-10-CM

## 2016-09-24 DIAGNOSIS — R0602 Shortness of breath: Secondary | ICD-10-CM | POA: Diagnosis not present

## 2016-09-24 DIAGNOSIS — Z006 Encounter for examination for normal comparison and control in clinical research program: Secondary | ICD-10-CM | POA: Diagnosis not present

## 2016-09-24 DIAGNOSIS — Z6841 Body Mass Index (BMI) 40.0 and over, adult: Secondary | ICD-10-CM | POA: Diagnosis not present

## 2016-09-24 DIAGNOSIS — M5136 Other intervertebral disc degeneration, lumbar region: Secondary | ICD-10-CM | POA: Diagnosis present

## 2016-09-24 DIAGNOSIS — Z794 Long term (current) use of insulin: Secondary | ICD-10-CM | POA: Diagnosis not present

## 2016-09-24 DIAGNOSIS — I350 Nonrheumatic aortic (valve) stenosis: Secondary | ICD-10-CM

## 2016-09-24 DIAGNOSIS — J9811 Atelectasis: Secondary | ICD-10-CM | POA: Diagnosis not present

## 2016-09-24 DIAGNOSIS — Z853 Personal history of malignant neoplasm of breast: Secondary | ICD-10-CM | POA: Diagnosis not present

## 2016-09-24 DIAGNOSIS — Z87891 Personal history of nicotine dependence: Secondary | ICD-10-CM

## 2016-09-24 DIAGNOSIS — Z8249 Family history of ischemic heart disease and other diseases of the circulatory system: Secondary | ICD-10-CM

## 2016-09-24 DIAGNOSIS — Z7951 Long term (current) use of inhaled steroids: Secondary | ICD-10-CM

## 2016-09-24 DIAGNOSIS — I340 Nonrheumatic mitral (valve) insufficiency: Secondary | ICD-10-CM | POA: Diagnosis not present

## 2016-09-24 DIAGNOSIS — E669 Obesity, unspecified: Secondary | ICD-10-CM

## 2016-09-24 DIAGNOSIS — J45909 Unspecified asthma, uncomplicated: Secondary | ICD-10-CM | POA: Diagnosis not present

## 2016-09-24 DIAGNOSIS — R5381 Other malaise: Secondary | ICD-10-CM | POA: Diagnosis present

## 2016-09-24 DIAGNOSIS — Z981 Arthrodesis status: Secondary | ICD-10-CM

## 2016-09-24 DIAGNOSIS — Z954 Presence of other heart-valve replacement: Secondary | ICD-10-CM | POA: Diagnosis not present

## 2016-09-24 DIAGNOSIS — Z885 Allergy status to narcotic agent status: Secondary | ICD-10-CM

## 2016-09-24 DIAGNOSIS — M4800 Spinal stenosis, site unspecified: Secondary | ICD-10-CM | POA: Diagnosis present

## 2016-09-24 DIAGNOSIS — Z952 Presence of prosthetic heart valve: Secondary | ICD-10-CM

## 2016-09-24 DIAGNOSIS — Z7984 Long term (current) use of oral hypoglycemic drugs: Secondary | ICD-10-CM | POA: Diagnosis not present

## 2016-09-24 DIAGNOSIS — F418 Other specified anxiety disorders: Secondary | ICD-10-CM | POA: Diagnosis not present

## 2016-09-24 DIAGNOSIS — I5032 Chronic diastolic (congestive) heart failure: Secondary | ICD-10-CM | POA: Diagnosis present

## 2016-09-24 DIAGNOSIS — I2510 Atherosclerotic heart disease of native coronary artery without angina pectoris: Secondary | ICD-10-CM | POA: Diagnosis present

## 2016-09-24 DIAGNOSIS — K219 Gastro-esophageal reflux disease without esophagitis: Secondary | ICD-10-CM | POA: Diagnosis present

## 2016-09-24 DIAGNOSIS — Z96653 Presence of artificial knee joint, bilateral: Secondary | ICD-10-CM | POA: Diagnosis present

## 2016-09-24 DIAGNOSIS — I110 Hypertensive heart disease with heart failure: Secondary | ICD-10-CM | POA: Diagnosis not present

## 2016-09-24 DIAGNOSIS — E119 Type 2 diabetes mellitus without complications: Secondary | ICD-10-CM | POA: Diagnosis not present

## 2016-09-24 DIAGNOSIS — I080 Rheumatic disorders of both mitral and aortic valves: Secondary | ICD-10-CM | POA: Diagnosis not present

## 2016-09-24 DIAGNOSIS — E1122 Type 2 diabetes mellitus with diabetic chronic kidney disease: Secondary | ICD-10-CM

## 2016-09-24 DIAGNOSIS — Z881 Allergy status to other antibiotic agents status: Secondary | ICD-10-CM | POA: Diagnosis not present

## 2016-09-24 HISTORY — PX: TEE WITHOUT CARDIOVERSION: SHX5443

## 2016-09-24 HISTORY — DX: Presence of prosthetic heart valve: Z95.2

## 2016-09-24 HISTORY — PX: TRANSCATHETER AORTIC VALVE REPLACEMENT, TRANSFEMORAL: SHX6400

## 2016-09-24 LAB — POCT I-STAT 3, ART BLOOD GAS (G3+)
ACID-BASE EXCESS: 6 mmol/L — AB (ref 0.0–2.0)
Acid-Base Excess: 1 mmol/L (ref 0.0–2.0)
BICARBONATE: 29.1 mmol/L — AB (ref 20.0–28.0)
BICARBONATE: 24.8 mmol/L (ref 20.0–28.0)
Bicarbonate: 25.7 mmol/L (ref 20.0–28.0)
O2 SAT: 100 %
O2 SAT: 99 %
O2 Saturation: 99 %
PCO2 ART: 41.3 mmHg (ref 32.0–48.0)
PO2 ART: 163 mmHg — AB (ref 83.0–108.0)
TCO2: 30 mmol/L (ref 0–100)
TCO2: 26 mmol/L (ref 0–100)
TCO2: 27 mmol/L (ref 0–100)
pCO2 arterial: 34.9 mmHg (ref 32.0–48.0)
pCO2 arterial: 39 mmHg (ref 32.0–48.0)
pH, Arterial: 7.529 — ABNORMAL HIGH (ref 7.350–7.450)
pH, Arterial: 7.411 (ref 7.350–7.450)
pH, Arterial: 7.399 (ref 7.350–7.450)
pO2, Arterial: 138 mmHg — ABNORMAL HIGH (ref 83.0–108.0)
pO2, Arterial: 113 mmHg — ABNORMAL HIGH (ref 83.0–108.0)

## 2016-09-24 LAB — CBC
HCT: 35 % — ABNORMAL LOW (ref 36.0–46.0)
HEMOGLOBIN: 11.4 g/dL — AB (ref 12.0–15.0)
MCH: 26.6 pg (ref 26.0–34.0)
MCHC: 32.6 g/dL (ref 30.0–36.0)
MCV: 81.8 fL (ref 78.0–100.0)
PLATELETS: 172 10*3/uL (ref 150–400)
RBC: 4.28 MIL/uL (ref 3.87–5.11)
RDW: 15.5 % (ref 11.5–15.5)
WBC: 8.4 10*3/uL (ref 4.0–10.5)

## 2016-09-24 LAB — POCT I-STAT 4, (NA,K, GLUC, HGB,HCT)
Glucose, Bld: 150 mg/dL — ABNORMAL HIGH (ref 65–99)
HEMATOCRIT: 35 % — AB (ref 36.0–46.0)
Hemoglobin: 11.9 g/dL — ABNORMAL LOW (ref 12.0–15.0)
Potassium: 3.1 mmol/L — ABNORMAL LOW (ref 3.5–5.1)
Sodium: 145 mmol/L (ref 135–145)

## 2016-09-24 LAB — POCT I-STAT, CHEM 8
BUN: 13 mg/dL (ref 6–20)
BUN: 10 mg/dL (ref 6–20)
BUN: 9 mg/dL (ref 6–20)
CALCIUM ION: 1.2 mmol/L (ref 1.15–1.40)
CREATININE: 0.6 mg/dL (ref 0.44–1.00)
CREATININE: 0.6 mg/dL (ref 0.44–1.00)
Calcium, Ion: 1.17 mmol/L (ref 1.15–1.40)
Calcium, Ion: 1.22 mmol/L (ref 1.15–1.40)
Chloride: 105 mmol/L (ref 101–111)
Chloride: 107 mmol/L (ref 101–111)
Chloride: 107 mmol/L (ref 101–111)
Creatinine, Ser: 0.5 mg/dL (ref 0.44–1.00)
Glucose, Bld: 124 mg/dL — ABNORMAL HIGH (ref 65–99)
Glucose, Bld: 169 mg/dL — ABNORMAL HIGH (ref 65–99)
Glucose, Bld: 166 mg/dL — ABNORMAL HIGH (ref 65–99)
HCT: 33 % — ABNORMAL LOW (ref 36.0–46.0)
HCT: 31 % — ABNORMAL LOW (ref 36.0–46.0)
HEMATOCRIT: 33 % — AB (ref 36.0–46.0)
Hemoglobin: 11.2 g/dL — ABNORMAL LOW (ref 12.0–15.0)
Hemoglobin: 11.2 g/dL — ABNORMAL LOW (ref 12.0–15.0)
Hemoglobin: 10.5 g/dL — ABNORMAL LOW (ref 12.0–15.0)
Potassium: 3.6 mmol/L (ref 3.5–5.1)
Potassium: 2.9 mmol/L — ABNORMAL LOW (ref 3.5–5.1)
Potassium: 3 mmol/L — ABNORMAL LOW (ref 3.5–5.1)
Sodium: 143 mmol/L (ref 135–145)
Sodium: 145 mmol/L (ref 135–145)
Sodium: 144 mmol/L (ref 135–145)
TCO2: 28 mmol/L (ref 0–100)
TCO2: 25 mmol/L (ref 0–100)
TCO2: 26 mmol/L (ref 0–100)

## 2016-09-24 LAB — GLUCOSE, CAPILLARY: Glucose-Capillary: 107 mg/dL — ABNORMAL HIGH (ref 65–99)

## 2016-09-24 LAB — PROTIME-INR
INR: 1.05
PROTHROMBIN TIME: 13.7 s (ref 11.4–15.2)

## 2016-09-24 LAB — APTT: aPTT: 29 seconds (ref 24–36)

## 2016-09-24 SURGERY — IMPLANTATION, AORTIC VALVE, TRANSCATHETER, FEMORAL APPROACH
Anesthesia: General | Site: Chest

## 2016-09-24 MED ORDER — DEXTROSE 5 % IV SOLN
1.5000 g | Freq: Two times a day (BID) | INTRAVENOUS | Status: AC
  Administered 2016-09-24 – 2016-09-26 (×4): 1.5 g via INTRAVENOUS
  Filled 2016-09-24 (×6): qty 1.5

## 2016-09-24 MED ORDER — TRAMADOL HCL 50 MG PO TABS: 50.0000 mg | ORAL_TABLET | ORAL | Status: DC | PRN

## 2016-09-24 MED ORDER — FENTANYL CITRATE (PF) 100 MCG/2ML IJ SOLN
INTRAMUSCULAR | Status: DC | PRN
  Administered 2016-09-24 (×2): 50 ug via INTRAVENOUS
  Administered 2016-09-24: 150 ug via INTRAVENOUS

## 2016-09-24 MED ORDER — CHLORHEXIDINE GLUCONATE CLOTH 2 % EX PADS
6.0000 | MEDICATED_PAD | Freq: Every day | CUTANEOUS | Status: DC
  Administered 2016-09-24: 6 via TOPICAL

## 2016-09-24 MED ORDER — ALBUMIN HUMAN 5 % IV SOLN
250.0000 mL | INTRAVENOUS | Status: AC | PRN
  Filled 2016-09-24: qty 250

## 2016-09-24 MED ORDER — PANTOPRAZOLE SODIUM 40 MG PO TBEC
40.0000 mg | DELAYED_RELEASE_TABLET | Freq: Every day | ORAL | Status: DC
  Administered 2016-09-26: 40 mg via ORAL
  Filled 2016-09-24: qty 1

## 2016-09-24 MED ORDER — ROCURONIUM BROMIDE 10 MG/ML (PF) SYRINGE
PREFILLED_SYRINGE | INTRAVENOUS | Status: AC
  Filled 2016-09-24: qty 5

## 2016-09-24 MED ORDER — ASPIRIN 81 MG PO CHEW
81.0000 mg | CHEWABLE_TABLET | Freq: Every day | ORAL | Status: DC
  Administered 2016-09-25 – 2016-09-26 (×2): 81 mg via ORAL
  Filled 2016-09-24 (×2): qty 1

## 2016-09-24 MED ORDER — FENTANYL CITRATE (PF) 250 MCG/5ML IJ SOLN
INTRAMUSCULAR | Status: AC
  Filled 2016-09-24: qty 5

## 2016-09-24 MED ORDER — NITROGLYCERIN IN D5W 200-5 MCG/ML-% IV SOLN: 0.0000 ug/min | INTRAVENOUS | Status: DC

## 2016-09-24 MED ORDER — LACTATED RINGERS IV SOLN
500.0000 mL | Freq: Once | INTRAVENOUS | Status: AC | PRN
  Administered 2016-09-24: 11:00:00 via INTRAVENOUS

## 2016-09-24 MED ORDER — SODIUM CHLORIDE 0.9 % IV SOLN
1.0000 mL/kg/h | INTRAVENOUS | Status: AC
  Administered 2016-09-24: 1 mL/kg/h via INTRAVENOUS

## 2016-09-24 MED ORDER — SODIUM CHLORIDE 0.9% FLUSH: 10.0000 mL | INTRAVENOUS | Status: DC | PRN

## 2016-09-24 MED ORDER — CHLORHEXIDINE GLUCONATE 4 % EX LIQD: 60.0000 mL | Freq: Once | CUTANEOUS | Status: DC

## 2016-09-24 MED ORDER — MIDAZOLAM HCL 2 MG/2ML IJ SOLN: 2.0000 mg | INTRAMUSCULAR | Status: DC | PRN

## 2016-09-24 MED ORDER — 0.9 % SODIUM CHLORIDE (POUR BTL) OPTIME
TOPICAL | Status: DC | PRN
  Administered 2016-09-24: 1000 mL

## 2016-09-24 MED ORDER — ONDANSETRON HCL 4 MG/2ML IJ SOLN
4.0000 mg | Freq: Four times a day (QID) | INTRAMUSCULAR | Status: DC | PRN
  Administered 2016-09-25 (×2): 4 mg via INTRAVENOUS
  Filled 2016-09-24 (×2): qty 2

## 2016-09-24 MED ORDER — HEPARIN SODIUM (PORCINE) 5000 UNIT/ML IJ SOLN
INTRAMUSCULAR | Status: DC | PRN
  Administered 2016-09-24: 500 mL

## 2016-09-24 MED ORDER — MIDAZOLAM HCL 2 MG/2ML IJ SOLN
INTRAMUSCULAR | Status: AC
  Filled 2016-09-24: qty 2

## 2016-09-24 MED ORDER — ATORVASTATIN CALCIUM 40 MG PO TABS: 40.0000 mg | ORAL_TABLET | Freq: Every day | ORAL | Status: DC

## 2016-09-24 MED ORDER — CHLORHEXIDINE GLUCONATE CLOTH 2 % EX PADS
6.0000 | MEDICATED_PAD | Freq: Every day | CUTANEOUS | Status: DC
  Administered 2016-09-25: 6 via TOPICAL

## 2016-09-24 MED ORDER — CHLORHEXIDINE GLUCONATE 4 % EX LIQD: 30.0000 mL | CUTANEOUS | Status: DC

## 2016-09-24 MED ORDER — PROTAMINE SULFATE 10 MG/ML IV SOLN
INTRAVENOUS | Status: DC | PRN
  Administered 2016-09-24: 10 mg via INTRAVENOUS
  Administered 2016-09-24 (×4): 20 mg via INTRAVENOUS
  Administered 2016-09-24: 10 mg via INTRAVENOUS
  Administered 2016-09-24: 20 mg via INTRAVENOUS
  Administered 2016-09-24: 10 mg via INTRAVENOUS

## 2016-09-24 MED ORDER — FAMOTIDINE IN NACL 20-0.9 MG/50ML-% IV SOLN
20.0000 mg | Freq: Two times a day (BID) | INTRAVENOUS | Status: DC
  Administered 2016-09-24: 20 mg via INTRAVENOUS
  Filled 2016-09-24: qty 50

## 2016-09-24 MED ORDER — POTASSIUM CHLORIDE 10 MEQ/50ML IV SOLN
10.0000 meq | INTRAVENOUS | Status: AC
  Administered 2016-09-24 (×3): 10 meq via INTRAVENOUS
  Filled 2016-09-24: qty 50

## 2016-09-24 MED ORDER — PROPOFOL 10 MG/ML IV BOLUS
INTRAVENOUS | Status: DC | PRN
  Administered 2016-09-24: 100 mg via INTRAVENOUS
  Administered 2016-09-24: 60 mg via INTRAVENOUS
  Administered 2016-09-24 (×2): 20 mg via INTRAVENOUS

## 2016-09-24 MED ORDER — LACTATED RINGERS IV SOLN
INTRAVENOUS | Status: DC | PRN
  Administered 2016-09-24: 07:00:00 via INTRAVENOUS

## 2016-09-24 MED ORDER — SODIUM CHLORIDE 0.9 % IV SOLN: 1.0000 mL/kg/h | INTRAVENOUS | Status: AC

## 2016-09-24 MED ORDER — LIDOCAINE 2% (20 MG/ML) 5 ML SYRINGE
INTRAMUSCULAR | Status: AC
  Filled 2016-09-24: qty 5

## 2016-09-24 MED ORDER — ONDANSETRON HCL 4 MG/2ML IJ SOLN
INTRAMUSCULAR | Status: DC | PRN
Start: 2016-09-24 — End: 2016-09-24
  Administered 2016-09-24: 4 mg via INTRAVENOUS

## 2016-09-24 MED ORDER — ONDANSETRON HCL 4 MG/2ML IJ SOLN
INTRAMUSCULAR | Status: AC
  Filled 2016-09-24: qty 2

## 2016-09-24 MED ORDER — CLOPIDOGREL BISULFATE 75 MG PO TABS
75.0000 mg | ORAL_TABLET | Freq: Every day | ORAL | Status: DC
  Administered 2016-09-25 – 2016-09-26 (×2): 75 mg via ORAL
  Filled 2016-09-24 (×3): qty 1

## 2016-09-24 MED ORDER — ATORVASTATIN CALCIUM 40 MG PO TABS
40.0000 mg | ORAL_TABLET | Freq: Every day | ORAL | Status: DC
  Administered 2016-09-25: 40 mg via ORAL
  Filled 2016-09-24: qty 1

## 2016-09-24 MED ORDER — ANASTROZOLE 1 MG PO TABS
1.0000 mg | ORAL_TABLET | Freq: Every day | ORAL | Status: DC
Start: 2016-09-25 — End: 2016-09-26
  Administered 2016-09-25 – 2016-09-26 (×2): 1 mg via ORAL
  Filled 2016-09-24 (×2): qty 1

## 2016-09-24 MED ORDER — ARTIFICIAL TEARS OPHTHALMIC OINT
TOPICAL_OINTMENT | OPHTHALMIC | Status: AC
  Filled 2016-09-24: qty 3.5

## 2016-09-24 MED ORDER — SUGAMMADEX SODIUM 200 MG/2ML IV SOLN
INTRAVENOUS | Status: AC
  Filled 2016-09-24: qty 2

## 2016-09-24 MED ORDER — METOPROLOL TARTRATE 12.5 MG HALF TABLET: 12.5000 mg | ORAL_TABLET | Freq: Two times a day (BID) | ORAL | Status: DC

## 2016-09-24 MED ORDER — SODIUM CHLORIDE 0.9 % IV SOLN
0.0000 ug/min | INTRAVENOUS | Status: DC
  Filled 2016-09-24: qty 2

## 2016-09-24 MED ORDER — OXYCODONE HCL 5 MG PO TABS
5.0000 mg | ORAL_TABLET | ORAL | Status: DC | PRN
  Administered 2016-09-25: 10 mg via ORAL
  Filled 2016-09-24: qty 2

## 2016-09-24 MED ORDER — VANCOMYCIN HCL IN DEXTROSE 1-5 GM/200ML-% IV SOLN
1000.0000 mg | Freq: Once | INTRAVENOUS | Status: AC
  Administered 2016-09-24: 1000 mg via INTRAVENOUS
  Filled 2016-09-24: qty 200

## 2016-09-24 MED ORDER — METOPROLOL TARTRATE 5 MG/5ML IV SOLN: 2.5000 mg | INTRAVENOUS | Status: DC | PRN

## 2016-09-24 MED ORDER — SUGAMMADEX SODIUM 200 MG/2ML IV SOLN
INTRAVENOUS | Status: DC | PRN
  Administered 2016-09-24: 200 mg via INTRAVENOUS

## 2016-09-24 MED ORDER — HEPARIN SODIUM (PORCINE) 1000 UNIT/ML IJ SOLN
INTRAMUSCULAR | Status: AC
  Filled 2016-09-24: qty 1

## 2016-09-24 MED ORDER — CHLORHEXIDINE GLUCONATE 0.12 % MT SOLN: 15.0000 mL | OROMUCOSAL | Status: AC

## 2016-09-24 MED ORDER — ROCURONIUM BROMIDE 100 MG/10ML IV SOLN
INTRAVENOUS | Status: DC | PRN
  Administered 2016-09-24: 15 mg via INTRAVENOUS
  Administered 2016-09-24: 50 mg via INTRAVENOUS
  Administered 2016-09-24: 10 mg via INTRAVENOUS

## 2016-09-24 MED ORDER — MUPIROCIN 2 % EX OINT
1.0000 "application " | TOPICAL_OINTMENT | Freq: Two times a day (BID) | CUTANEOUS | Status: DC
  Administered 2016-09-24 – 2016-09-26 (×4): 1 via NASAL
  Filled 2016-09-24 (×3): qty 22

## 2016-09-24 MED ORDER — AMLODIPINE BESYLATE 10 MG PO TABS
10.0000 mg | ORAL_TABLET | Freq: Every day | ORAL | Status: DC
  Administered 2016-09-25 – 2016-09-26 (×2): 10 mg via ORAL
  Filled 2016-09-24 (×2): qty 1

## 2016-09-24 MED ORDER — SODIUM CHLORIDE 0.9% FLUSH
10.0000 mL | Freq: Two times a day (BID) | INTRAVENOUS | Status: DC
  Administered 2016-09-24 – 2016-09-26 (×2): 10 mL

## 2016-09-24 MED ORDER — LIDOCAINE HCL (CARDIAC) 20 MG/ML IV SOLN
INTRAVENOUS | Status: DC | PRN
Start: 2016-09-24 — End: 2016-09-24
  Administered 2016-09-24: 60 mg via INTRAVENOUS
  Administered 2016-09-24: 100 mg via INTRAVENOUS

## 2016-09-24 MED ORDER — MORPHINE SULFATE (PF) 4 MG/ML IV SOLN
1.0000 mg | INTRAVENOUS | Status: DC | PRN
  Administered 2016-09-25: 2 mg via INTRAVENOUS
  Filled 2016-09-24: qty 1

## 2016-09-24 MED ORDER — NOREPINEPHRINE BITARTRATE 1 MG/ML IV SOLN
INTRAVENOUS | Status: DC | PRN
  Administered 2016-09-24: 2 ug/min via INTRAVENOUS

## 2016-09-24 MED ORDER — MORPHINE SULFATE (PF) 2 MG/ML IV SOLN: 1.0000 mg | INTRAVENOUS | Status: DC | PRN

## 2016-09-24 MED ORDER — METOPROLOL TARTRATE 25 MG/10 ML ORAL SUSPENSION: 12.5000 mg | Freq: Two times a day (BID) | ORAL | Status: DC

## 2016-09-24 MED ORDER — HEPARIN SODIUM (PORCINE) 1000 UNIT/ML IJ SOLN
INTRAMUSCULAR | Status: DC | PRN
  Administered 2016-09-24: 13000 [IU] via INTRAVENOUS

## 2016-09-24 MED ORDER — IODIXANOL 320 MG/ML IV SOLN
INTRAVENOUS | Status: DC | PRN
  Administered 2016-09-24: 80.7 mL via INTRAVENOUS

## 2016-09-24 MED ORDER — ARTIFICIAL TEARS OPHTHALMIC OINT
TOPICAL_OINTMENT | OPHTHALMIC | Status: DC | PRN
  Administered 2016-09-24: 1 via OPHTHALMIC

## 2016-09-24 MED ORDER — PROPOFOL 10 MG/ML IV BOLUS
INTRAVENOUS | Status: AC
  Filled 2016-09-24: qty 40

## 2016-09-24 MED FILL — Magnesium Sulfate Inj 50%: INTRAMUSCULAR | Qty: 10 | Status: AC

## 2016-09-24 MED FILL — Potassium Chloride Inj 2 mEq/ML: INTRAVENOUS | Qty: 10 | Status: AC

## 2016-09-24 MED FILL — Heparin Sodium (Porcine) Inj 1000 Unit/ML: INTRAMUSCULAR | Qty: 30 | Status: AC

## 2016-09-24 SURGICAL SUPPLY — 101 items
ADAPTER UNIV SWAN GANZ BIP (ADAPTER) ×2 IMPLANT
ADAPTER UNV SWAN GANZ BIP (ADAPTER) ×1
ATTRACTOMAT 16X20 MAGNETIC DRP (DRAPES) IMPLANT
BAG BANDED W/RUBBER/TAPE 36X54 (MISCELLANEOUS) ×3 IMPLANT
BAG DECANTER FOR FLEXI CONT (MISCELLANEOUS) IMPLANT
BAG SNAP BAND KOVER 36X36 (MISCELLANEOUS) ×6 IMPLANT
BLADE 10 SAFETY STRL DISP (BLADE) ×3 IMPLANT
BLADE CLIPPER SURG (BLADE) IMPLANT
BLADE STERNUM SYSTEM 6 (BLADE) ×3 IMPLANT
CABLE PACING FASLOC BIEGE (MISCELLANEOUS) ×3 IMPLANT
CABLE PACING FASLOC BLUE (MISCELLANEOUS) ×3 IMPLANT
CANISTER SUCT 3000ML PPV (MISCELLANEOUS) IMPLANT
CANNULA FEM VENOUS REMOTE 22FR (CANNULA) IMPLANT
CANNULA OPTISITE PERFUSION 16F (CANNULA) IMPLANT
CANNULA OPTISITE PERFUSION 18F (CANNULA) IMPLANT
CATH DIAG EXPO 6F VENT PIG 145 (CATHETERS) ×6 IMPLANT
CATH EXPO 5FR AL1 (CATHETERS) ×3 IMPLANT
CATH S G BIP PACING (SET/KITS/TRAYS/PACK) ×6 IMPLANT
CLIP TI MEDIUM 24 (CLIP) ×3 IMPLANT
CLIP TI WIDE RED SMALL 24 (CLIP) ×3 IMPLANT
CONT SPEC 4OZ CLIKSEAL STRL BL (MISCELLANEOUS) ×6 IMPLANT
COVER BACK TABLE 60X90IN (DRAPES) ×3 IMPLANT
COVER BACK TABLE 80X110 HD (DRAPES) ×3 IMPLANT
COVER DOME SNAP 22 D (MISCELLANEOUS) ×3 IMPLANT
COVER MAYO STAND STRL (DRAPES) ×3 IMPLANT
COVER PROBE W GEL 5X96 (DRAPES) ×3 IMPLANT
CRADLE DONUT ADULT HEAD (MISCELLANEOUS) ×3 IMPLANT
DERMABOND ADVANCED (GAUZE/BANDAGES/DRESSINGS) ×1
DERMABOND ADVANCED .7 DNX12 (GAUZE/BANDAGES/DRESSINGS) ×2 IMPLANT
DEVICE CLOSURE PERCLS PRGLD 6F (VASCULAR PRODUCTS) IMPLANT
DRAPE INCISE IOBAN 66X45 STRL (DRAPES) IMPLANT
DRAPE SLUSH MACHINE 52X66 (DRAPES) ×3 IMPLANT
DRSG TEGADERM 4X4.75 (GAUZE/BANDAGES/DRESSINGS) ×3 IMPLANT
ELECT REM PT RETURN 9FT ADLT (ELECTROSURGICAL) ×6
ELECTRODE REM PT RTRN 9FT ADLT (ELECTROSURGICAL) ×4 IMPLANT
FELT TEFLON 6X6 (MISCELLANEOUS) ×3 IMPLANT
FEMORAL VENOUS CANN RAP (CANNULA) IMPLANT
GAUZE SPONGE 4X4 12PLY STRL (GAUZE/BANDAGES/DRESSINGS) ×3 IMPLANT
GAUZE SPONGE 4X4 12PLY STRL LF (GAUZE/BANDAGES/DRESSINGS) ×3 IMPLANT
GLOVE BIO SURGEON STRL SZ7.5 (GLOVE) ×3 IMPLANT
GLOVE BIO SURGEON STRL SZ8 (GLOVE) ×6 IMPLANT
GLOVE EUDERMIC 7 POWDERFREE (GLOVE) ×3 IMPLANT
GLOVE ORTHO TXT STRL SZ7.5 (GLOVE) ×3 IMPLANT
GOWN STRL REUS W/ TWL LRG LVL3 (GOWN DISPOSABLE) ×6 IMPLANT
GOWN STRL REUS W/ TWL XL LVL3 (GOWN DISPOSABLE) ×12 IMPLANT
GOWN STRL REUS W/TWL LRG LVL3 (GOWN DISPOSABLE) ×3
GOWN STRL REUS W/TWL XL LVL3 (GOWN DISPOSABLE) ×6
GUIDEWIRE SAF TJ AMPL .035X180 (WIRE) ×3 IMPLANT
GUIDEWIRE SAFE TJ AMPLATZ EXST (WIRE) ×3 IMPLANT
GUIDEWIRE STRAIGHT .035 260CM (WIRE) ×3 IMPLANT
INSERT FOGARTY 61MM (MISCELLANEOUS) ×3 IMPLANT
INSERT FOGARTY SM (MISCELLANEOUS) IMPLANT
INSERT FOGARTY XLG (MISCELLANEOUS) IMPLANT
KIT BASIN OR (CUSTOM PROCEDURE TRAY) ×3 IMPLANT
KIT DILATOR VASC 18G NDL (KITS) IMPLANT
KIT HEART LEFT (KITS) ×3 IMPLANT
KIT ROOM TURNOVER OR (KITS) ×3 IMPLANT
KIT SUCTION CATH 14FR (SUCTIONS) ×6 IMPLANT
NEEDLE PERC 18GX7CM (NEEDLE) ×3 IMPLANT
NS IRRIG 1000ML POUR BTL (IV SOLUTION) ×9 IMPLANT
PACK AORTA (CUSTOM PROCEDURE TRAY) ×3 IMPLANT
PAD ARMBOARD 7.5X6 YLW CONV (MISCELLANEOUS) ×6 IMPLANT
PAD ELECT DEFIB RADIOL ZOLL (MISCELLANEOUS) ×3 IMPLANT
PATCH TACHOSII LRG 9.5X4.8 (VASCULAR PRODUCTS) IMPLANT
PERCLOSE PROGLIDE 6F (VASCULAR PRODUCTS)
SET MICROPUNCTURE 5F STIFF (MISCELLANEOUS) ×3 IMPLANT
SHEATH AVANTI 11CM 8FR (MISCELLANEOUS) ×3 IMPLANT
SHEATH PINNACLE 6F 10CM (SHEATH) ×6 IMPLANT
SLEEVE REPOSITIONING LENGTH 30 (MISCELLANEOUS) ×3 IMPLANT
SPONGE LAP 4X18 X RAY DECT (DISPOSABLE) ×3 IMPLANT
STOPCOCK MORSE 400PSI 3WAY (MISCELLANEOUS) ×18 IMPLANT
SUT ETHIBOND X763 2 0 SH 1 (SUTURE) IMPLANT
SUT GORETEX CV 4 TH 22 36 (SUTURE) IMPLANT
SUT GORETEX CV4 TH-18 (SUTURE) IMPLANT
SUT GORETEX TH-18 36 INCH (SUTURE) IMPLANT
SUT MNCRL AB 3-0 PS2 18 (SUTURE) IMPLANT
SUT PROLENE 3 0 SH1 36 (SUTURE) IMPLANT
SUT PROLENE 4 0 RB 1 (SUTURE)
SUT PROLENE 4-0 RB1 .5 CRCL 36 (SUTURE) IMPLANT
SUT PROLENE 5 0 C 1 36 (SUTURE) IMPLANT
SUT PROLENE 6 0 C 1 30 (SUTURE) IMPLANT
SUT SILK  1 MH (SUTURE) ×1
SUT SILK 1 MH (SUTURE) ×2 IMPLANT
SUT SILK 2 0 SH CR/8 (SUTURE) IMPLANT
SUT SILK 4 0 P 3 (SUTURE) ×3 IMPLANT
SUT VIC AB 2-0 CT1 27 (SUTURE)
SUT VIC AB 2-0 CT1 TAPERPNT 27 (SUTURE) IMPLANT
SUT VIC AB 2-0 CTX 36 (SUTURE) IMPLANT
SUT VIC AB 3-0 SH 8-18 (SUTURE) IMPLANT
SYR 10ML LL (SYRINGE) ×9 IMPLANT
SYR 30ML LL (SYRINGE) ×6 IMPLANT
SYR 50ML LL SCALE MARK (SYRINGE) ×3 IMPLANT
TAPE CLOTH SURG 4X10 WHT LF (GAUZE/BANDAGES/DRESSINGS) ×3 IMPLANT
TOWEL OR 17X26 10 PK STRL BLUE (TOWEL DISPOSABLE) ×6 IMPLANT
TRANSDUCER W/STOPCOCK (MISCELLANEOUS) ×6 IMPLANT
TRAY FOLEY SILVER 14FR TEMP (SET/KITS/TRAYS/PACK) ×3 IMPLANT
TUBE SUCT INTRACARD DLP 20F (MISCELLANEOUS) IMPLANT
TUBING HIGH PRESSURE 120CM (CONNECTOR) ×3 IMPLANT
VALVE HEART TRANSCATH SZ3 23MM (Prosthesis & Implant Heart) ×3 IMPLANT
WIRE AMPLATZ SS-J .035X180CM (WIRE) ×3 IMPLANT
WIRE BENTSON .035X145CM (WIRE) ×3 IMPLANT

## 2016-09-24 NOTE — H&P (View-Only) (Signed)
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 .6 mm. No beta blockade or nitro were given. The 3D data set was reconstructed in 10% intervals of the R-R cycle. Systolic and diastolic phases were analyzed on a dedicated work station using MPR, MIP and VRT modes. The patient received 80 cc of contrast.  FINDINGS: Aortic Valve:  Tri-leaflet calcified with restricted motion  Aorta: Moderate atherosclerosis and calcification of the Arch and descending thoracic aorta  LAA:  No Thrombus  Sinotubular Junction:  2.3 cm  Ascending Thoracic Aorta:  3.0 cm  Aortic Arch:  2.6 cm  Descending Thoracic Aorta:  2.7 cm  Sinus of Valsalva Measurements:  Non-coronary:  2.5 cm  Right - coronary:   2.5 cm  Left - coronary:  2.6 cm  Coronary Artery Height above Annulus:  Left Main:  9.5 mm above annulus  Right Coronary:  11 mm above annulus  Virtual Basal Annulus Measurements:  Maximum/Minimum Diameter:  25 mm x 21.8 mm  Perimeter:  74 mm  Area:  436 mm2  Coronary Arteries:  Sufficient height above annulus for deployment  Optimum Fluoroscopic Angle for Delivery:  LAO 26 Caudal 6 degrees  IMPRESSION: 1) Tri-leaflet aortic valve with annulus suitable for an under inflated 26 mm Sapien 3 valve annular area 436 mm2  2) Optimum angiographic angle for deployment LAO 26 Caudal 6 degrees  3) No LAA thrombus  4) Coronary arteries sufficient height above annulus for deployment  5) Moderate calcification and atherosclerosis of arch and descending thoracic aorta with no aneurysm root measures 3.0 cm  Jenkins Rouge   Electronically Signed   By: Jenkins Rouge M.D.   On: 08/29/2016 12:13   Addended by Josue Hector, MD on 08/29/2016 12:15 PM    Study Result   EXAM: OVER-READ INTERPRETATION  CT CHEST  The following report is an over-read performed by radiologist Dr. Alvino Blood [facility], [place] on 08/29/2016. This over-read does not include interpretation of cardiac or coronary anatomy or pathology. The coronary CTA interpretation by the cardiologist is attached.  COMPARISON:  None.  FINDINGS: Limited view of the lung parenchyma demonstrates no suspicious nodularity. Mild linear atelectasis in the lingula. Airways are normal.  Limited view of the mediastinum demonstrates no adenopathy. Esophagus normal.  Limited view of the upper abdomen unremarkable. Simple cyst upper pole of the LEFT kidney.  Limited view of the skeleton and chest wall demonstrates a 6.5 cm seroma within the lateral RIGHT breast.  IMPRESSION: 1. Lungs are clear. 2. RIGHT breast seroma.  Electronically  Signed: By: Suzy Bouchard M.D. On:  08/29/2016 10:33        CLINICAL DATA:  71 year old female with history of severe aortic stenosis. Preprocedural study prior to potential transcatheter aortic valve replacement.  EXAM: CT ANGIOGRAPHY CHEST, ABDOMEN AND PELVIS  TECHNIQUE: Multidetector CT imaging through the chest, abdomen and pelvis was performed using the standard protocol during bolus administration of intravenous contrast. Multiplanar reconstructed images and MIPs were obtained and reviewed to evaluate the vascular anatomy.  CONTRAST:  75 mL of Isovue 370.  COMPARISON:  None.  FINDINGS: CTA CHEST FINDINGS  Cardiovascular: Heart size is mildly enlarged. There is no significant pericardial fluid, thickening or pericardial calcification. There is aortic atherosclerosis, as well as atherosclerosis of the great vessels of the mediastinum and the coronary arteries, including calcified atherosclerotic plaque in the left anterior descending and right coronary arteries. Thickening calcification of the aortic valve.  Mediastinum/Lymph Nodes: No pathologically enlarged mediastinal or hilar lymph nodes. Esophagus is unremarkable in appearance. No axillary lymphadenopathy.  Lungs/Pleura: No suspicious appearing pulmonary nodules or masses. No acute consolidative airspace disease. No pleural effusions. Scattered areas of linear scarring are noted in the inferior segment of the lingula and in the right middle lobe.  Musculoskeletal/Soft Tissues: Postoperative changes of prior lumpectomy are noted in the right breast. There is a large postoperative fluid collection measuring 7.7 x 6.5 cm which is centrally low-attenuation and has some peripheral calcifications, presumably a chronic postoperative seroma. Extensive skin thickening throughout the right breast may reflect changes from prior surgery and/or radiation therapy. There are no aggressive appearing lytic or blastic lesions noted in the visualized  portions of the skeleton.  CTA ABDOMEN AND PELVIS FINDINGS  Hepatobiliary: No cystic or solid hepatic lesions. No intra or extrahepatic biliary ductal dilatation. Gallbladder is normal in appearance.  Pancreas: No pancreatic mass. No pancreatic ductal dilatation. No pancreatic or peripancreatic fluid or inflammatory changes.  Spleen: Unremarkable.  Adrenals/Urinary Tract: 3.4 cm low-attenuation lesion in the upper pole of the left kidney is compatible with a simple cyst. Right kidney and bilateral adrenal glands are normal in appearance. No hydroureteronephrosis. Urinary bladder is normal in appearance.  Stomach/Bowel: The appearance of the stomach is normal. There is no pathologic dilatation of small bowel or colon. Numerous colonic diverticulae are noted, particularly in the sigmoid colon, without surrounding inflammatory changes to suggest an acute diverticulitis at this time. Normal appendix.  Vascular/Lymphatic: Aortic atherosclerosis, with vascular findings and measurements pertinent to potential TAVR procedure, as detailed below. No aneurysm or dissection noted in the abdominal or pelvic vasculature. Celiac axis, superior mesenteric artery and inferior mesenteric artery are all widely patent, without hemodynamically significant stenosis. Single renal arteries bilaterally are widely patent. No lymphadenopathy noted in the abdomen or pelvis.  Reproductive: Uterus and ovaries are unremarkable in appearance.  Other: No significant volume of ascites.  No pneumoperitoneum.  Musculoskeletal: Status post PLIF at L2-L3. Interbody grafts are noted at L3-L4, L4-L5 and L5-S1. Interbody cage at L2-L3. There are no aggressive appearing lytic or blastic lesions noted in the visualized portions of the skeleton.  VASCULAR MEASUREMENTS PERTINENT TO TAVR:  AORTA:  Minimal Aortic Diameter -  16 x 16 mm  Severity of Aortic Calcification -  mild  RIGHT  PELVIS:  Right Common Iliac Artery -  Minimal Diameter - 7.9 x 9.8 mm  Tortuosity - mild  Calcification - mild  Right External Iliac Artery -  Minimal Diameter - 7.9 x 7.4 mm  Tortuosity - moderate  Calcification -  none  Right Common Femoral Artery -  Minimal Diameter - 7.6 x 8.0 mm  Tortuosity - mild  Calcification - none  LEFT PELVIS:  Left Common Iliac Artery -  Minimal Diameter - 11.5 x 10.6 mm  Tortuosity - mild  Calcification - mild  Left External Iliac Artery -  Minimal Diameter - 8.6 x 8.4 mm  Tortuosity - moderate  Calcification - none  Left Common Femoral Artery -  Minimal Diameter - 8.0 x 7.2 mm  Tortuosity - mild  Calcification - none  Review of the MIP images confirms the above findings.  IMPRESSION: 1. Vascular findings and measurements pertinent to potential TAVR procedure, as detailed above. The patient does appear to have suitable pelvic arterial access bilaterally. 2. Severe thickening calcification of the aortic valve, compatible with the reported clinical history of severe aortic stenosis. 3. Aortic atherosclerosis, in addition to 2 vessel coronary artery disease. Assessment for potential risk factor modification, dietary therapy or pharmacologic therapy may be warranted, if clinically indicated. 4. Postoperative changes in the right breast related to prior lumpectomy with postoperative fluid collection, presumably a chronic seroma, and extensive skin thickening throughout the right breast which may reflect prior radiation therapy. 5. No definite findings to suggest metastatic disease in the chest, abdomen or pelvis. 6. Colonic diverticulosis without evidence of acute diverticulitis at this time. 7. Additional incidental findings, as above.   Electronically Signed   By: Vinnie Langton M.D.   On: 08/30/2016 12:56   STS Risk Calculator  Procedure                                           AVR  Risk of Mortality                                3.28% Morbidity or Mortality                       23.0% Prolonged LOS                                   12.7% Short LOS                                           20.3% Permanent Stroke                             1.5% Prolonged Vent Support                     17.2% DSW Infection                                     0.6% Renal Failure                                       7.8% Reoperation  8.3%   Impression:   This 71 year old woman has stage D severe symptomatic aortic stenosis with a history of progressive exertional fatigue and shortness of breath consistent with chronic diastolic congestive heart failure, New York Heart Association functional class II. I have personally reviewed the patient's recent transthoracic echocardiogram, cardiac catheterization, and CT angiograms. Echocardiogram reveals the patient's aortic valve is trileaflet with severe thickening and calcification of all 3 leaflets. There is severely restricted leaflet mobility a mean transvalvular gradient of 48 mmHg.  There is moderate left ventricular hypertrophy with significant diastolic dysfunction but left ventricular systolic function remains normal.Cardiac catheterization shows only nonobstructive coronary artery disease. I agree that AVR is indicated in this patient for symptom relief and to prevent progressive LV deterioration. 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  Under the circumstances I think TAVR is a good alternative to conventional surgery.  Cardiac CT angiogram of the heart reveals findings consistent with severe aortic stenosis with anatomical characteristics favorable for  transcatheter aortic valve replacement without any complicating features. CTA of the abdomen and pelvis demonstrates adequate pelvic vascular access to allow a transfemoral approach.  The patient was counseled at length regarding treatment alternatives for management of severe symptomatic aortic stenosis. The risks and benefits of surgical intervention has been discussed in detail. Long-term prognosis with medical therapy was discussed. Alternative approaches such as conventional surgical aortic valve replacement, transcatheter aortic valve replacement, and palliative medical therapy were compared and contrasted at length. This discussion was placed in the context of the patient's own specific clinical presentation and past medical history. All of their questions been addressed. The patient is eager to proceed with surgical management as soon as possible.   Following the decision to proceed with transcatheter aortic valve replacement, a discussion was held regarding what types of management strategies would be attempted intraoperatively in the event of life-threatening complications, including whether or not the patient would be considered a candidate for the use of cardiopulmonary bypass and/or conversion to open sternotomy for attempted surgical intervention.   The patient has been advised of a variety of complications that might develop including but not limited to risks of death, stroke, paravalvular leak, aortic dissection or other major vascular complications, aortic annulus rupture, device embolization, cardiac rupture or perforation, mitral regurgitation, acute myocardial infarction, arrhythmia, heart block or bradycardia requiring permanent pacemaker placement, congestive heart failure, respiratory failure, renal failure, pneumonia, infection, other late complications related to structural valve deterioration or migration, or other complications that might ultimately cause a temporary or permanent  loss of functional independence or other long term morbidity. The patient provides full informed consent for the procedure as described and all questions were answered.   Plan:  She will be scheduled for transfemoral TAVR with Dr. Roxy Manns and Dr. Burt Knack on 09/24/2016.   I spent 60 minutes performing this consultation and > 50% of this time was spent face to face counseling and coordinating the care of this patient's severe aortic stenosis.   Gaye Pollack, MD 09/18/2016

## 2016-09-24 NOTE — Op Note (Signed)
[facility]   MULTIDISCIPLINARY HEART VALVE TEAM   TAVR OPERATIVE NOTE   Date of Procedure:  09/24/2016  Preoperative Diagnosis: Severe Aortic Stenosis   Postoperative Diagnosis: Same   Procedure:    Transcatheter Aortic Valve Replacement - Percutaneous Right Transfemoral Approach  Edwards Sapien 3 THV (size 23 mm, model # 9600TFX, serial # [id number])   Co-Surgeons:  Valentina Gu. Roxy Manns, MD and Sherren Mocha, MD  Anesthesiologist:  Rica Koyanagi, MD  Echocardiographer:  Jenkins Rouge, MD  Pre-operative Echo Findings:  Severe aortic stenosis  Normal left ventricular systolic function  Post-operative Echo Findings:  No paravalvular leak  Normal left ventricular systolic function   BRIEF CLINICAL NOTE AND INDICATIONS FOR SURGERY  Patient is a 71 year old morbidly obese African-American female with history of aortic stenosis, hypertension, insulin-dependent type 2 diabetes mellitus, hyperlipidemia, asthma, breast cancer status post lumpectomy, and degenerative disc disease of the lumbar spine status post spinal fusion on 3 previous occasions who has been referred for surgical consultation to discuss treatment options for management of severe symptomatic aortic stenosis.  Patient states that she was told she had rheumatic fever as a child and she has known of having a heart murmur for many years. She otherwise has no previous cardiac history. She was recently seen in follow-up by her primary care physician noticed a significant change in her heart murmur. Transthoracic echocardiogram was performed demonstrating the presence of severe aortic stenosis with preserved left ventricular systolic function. She was referred for cardiology consultation and initially evaluated by Dr. Oval Linsey on 08/19/2016. She was noted to complain of a long history of exertional shortness of breath that had progressed somewhat over the last few years. She was referred to the multidisciplinary  heart valve clinic and evaluated by Dr. Burt Knack on 08/22/2016.  She subsequently underwent left and right heart catheterization on 09/04/2016. This revealed diffuse nonobstructive coronary artery disease with normal right heart hemodynamics. CT angiography was performed and the patient was referred for surgical consultation.  During the course of the patient's preoperative work up they have been evaluated comprehensively by a multidisciplinary team of specialists coordinated through the [facility] in the [facility].  They have been demonstrated to suffer from symptomatic severe aortic stenosis as noted above. The patient has been counseled extensively as to the relative risks and benefits of all options for the treatment of severe aortic stenosis including long term medical therapy, conventional surgery for aortic valve replacement, and transcatheter aortic valve replacement.  All questions have been answered, and the patient provides full informed consent for the operation as described.   DETAILS OF THE OPERATIVE PROCEDURE  PREPARATION:    The patient is brought to the operating room on the above mentioned date and central monitoring was established by the anesthesia team including placement of a central venous line and radial arterial line. The patient is placed in the supine position on the operating table.  Intravenous antibiotics are administered.  General endotracheal anesthesia is induced uneventfully.  A Foley catheter is placed.  Baseline transesophageal echocardiogram was performed. The patient's chest, abdomen, both groins, and both lower extremities are prepared and draped in a sterile manner. A time out procedure is performed.   PERIPHERAL ACCESS:    Using the modified Seldinger technique, femoral arterial and venous access was obtained with placement of 6 Fr sheaths on the left side.  A pigtail diagnostic catheter was passed through  the left arterial sheath under fluoroscopic guidance  into the aortic root.  A temporary transvenous pacemaker catheter was passed through the left femoral venous sheath under fluoroscopic guidance into the right ventricle.  The pacemaker was tested to ensure stable lead placement and pacemaker capture. Aortic root angiography was performed in order to determine the optimal angiographic angle for valve deployment.   TRANSFEMORAL ACCESS:   Percutaneous transfemoral access and sheath placement was performed by Dr Burt Knack. Please see his separate operative note for details. The patient was heparinized systemically and ACT verified > 250 seconds.    A 14 Fr transfemoral E-sheath was introduced into the right common femoral artery after progressively dilating over an Amplatz superstiff wire. An AL-1 catheter was used to direct a straight-tip exchange length wire across the native aortic valve into the left ventricle. This was exchanged out for a pigtail catheter and position was confirmed in the LV apex. Simultaneous LV and Ao pressures were recorded.  The pigtail catheter was exchanged for an Amplatz Extra-stiff wire in the LV apex.  Echocardiography was utilized to confirm appropriate wire position and no sign of entanglement in the mitral subvalvular apparatus.   TRANSCATHETER HEART VALVE DEPLOYMENT:   An Edwards Sapien 3 transcatheter heart valve (size 23 mm, model #9600TFX, serial #[id number]) was prepared and crimped per manufacturer's guidelines, and the proper orientation of the valve is confirmed on the [employer] delivery system. The valve was advanced through the introducer sheath using normal technique until in an appropriate position in the abdominal aorta beyond the sheath tip. The balloon was then retracted and using the fine-tuning wheel was centered on the valve. The valve was then advanced across the aortic arch using appropriate flexion of the catheter. The valve was carefully positioned  across the aortic valve annulus. The Commander catheter was retracted using normal technique. Once final position of the valve has been confirmed by angiographic assessment, the valve is deployed while temporarily holding ventilation and during rapid ventricular pacing to maintain systolic blood pressure < 50 mmHg and pulse pressure < 10 mmHg. The balloon inflation is held for >3 seconds after reaching full deployment volume. Once the balloon has fully deflated the balloon is retracted into the ascending aorta and valve function is assessed using echocardiography. There is felt to be no paravalvular leak and trace central aortic insufficiency.  The patient's hemodynamic recovery following valve deployment is good.  The deployment balloon and guidewire are both removed. Final intraoperative TEE demostrated acceptable post-procedural gradients, stable mitral valve function, no aortic insufficiency, and stable LV systolic function.   PROCEDURE COMPLETION:   The sheath was removed and femoral artery closure performed by Dr Burt Knack. Please see his separate report for details.  Protamine was administered once femoral arterial repair was complete. The temporary pacemaker, pigtail catheters and femoral sheaths were removed with manual pressure used for hemostasis.   The patient tolerated the procedure well and is transported to the surgical intensive care in stable condition. There were no immediate intraoperative complications. All sponge instrument and needle counts are verified correct at completion of the operation.   No blood products were administered during the operation.  The patient received a total of 80.7 mL of intravenous contrast during the procedure.   Rexene Alberts, MD 09/24/2016 9:53 AM

## 2016-09-24 NOTE — Anesthesia Procedure Notes (Signed)
Procedure Name: Intubation Date/Time: 09/24/2016 7:55 AM Performed by: Suzy Bouchard Pre-anesthesia Checklist: Patient identified, Emergency Drugs available, Suction available and Patient being monitored Patient Re-evaluated:Patient Re-evaluated prior to inductionOxygen Delivery Method: Circle system utilized Preoxygenation: Pre-oxygenation with 100% oxygen Intubation Type: IV induction Ventilation: Mask ventilation without difficulty Laryngoscope Size: Mac and 4 Grade View: Grade I Tube type: Oral Tube size: 7.0 mm Number of attempts: 1 Airway Equipment and Method: Stylet and Oral airway Placement Confirmation: ETT inserted through vocal cords under direct vision,  positive ETCO2 and breath sounds checked- equal and bilateral Tube secured with: Tape Dental Injury: Teeth and Oropharynx as per pre-operative assessment  Comments: Wess Botts, SRNA

## 2016-09-24 NOTE — Anesthesia Postprocedure Evaluation (Addendum)
Anesthesia Post Note  Patient: Denise [name]  Procedure(s) Performed: Procedure(s) (LRB): TRANSCATHETER AORTIC VALVE REPLACEMENT, TRANSFEMORAL (N/A) TRANSESOPHAGEAL ECHOCARDIOGRAM (TEE) (N/A)  Patient location during evaluation: SICU Anesthesia Type: General Level of consciousness: sedated, awake and oriented Pain management: pain level controlled Vital Signs Assessment: post-procedure vital signs reviewed and stable Respiratory status: patient remains intubated per anesthesia plan Cardiovascular status: stable Anesthetic complications: no       Last Vitals:  Vitals:   09/24/16 1200 09/24/16 1300  BP: 112/62 111/60  Pulse: (!) 58 61  Resp: 17 20  Temp:      Last Pain:  Vitals:   09/24/16 1030  TempSrc: Oral                 Annabel Gibeau,JAMES TERRILL

## 2016-09-24 NOTE — Anesthesia Procedure Notes (Addendum)
Central Venous Catheter Insertion Performed by: Rica Koyanagi, anesthesiologist Start/End5/29/2018 7:10 AM, 09/24/2016 7:21 AM Patient location: Pre-op. Preanesthetic checklist: patient identified, IV checked, site marked, risks and benefits discussed, surgical consent, monitors and equipment checked, pre-op evaluation and timeout performed Position: Trendelenburg Lidocaine 1% used for infiltration Hand hygiene performed , maximum sterile barriers used  and Seldinger technique used Catheter size: 9 Fr Central line was placed.Double lumen Procedure performed using ultrasound guided technique. Ultrasound Notes:anatomy identified, needle tip was noted to be adjacent to the nerve/plexus identified and no ultrasound evidence of intravascular and/or intraneural injection Attempts: 1 Following insertion, dressing applied, line sutured and Biopatch. Post procedure assessment: blood return through all ports and free fluid flow  Post procedure complications: arrhythmia. Patient tolerated the procedure well with no immediate complications.

## 2016-09-24 NOTE — Progress Notes (Signed)
  Echocardiogram Echocardiogram Transesophageal has been performed.  Bobbye Charleston 09/24/2016, 9:54 AM

## 2016-09-24 NOTE — Transfer of Care (Signed)
Immediate Anesthesia Transfer of Care Note  Patient: Denise [name]  Procedure(s) Performed: Procedure(s): TRANSCATHETER AORTIC VALVE REPLACEMENT, TRANSFEMORAL (N/A) TRANSESOPHAGEAL ECHOCARDIOGRAM (TEE) (N/A)  Patient Location: ICU  Anesthesia Type:General  Level of Consciousness: sedated  Airway & Oxygen Therapy: Patient Spontanous Breathing and Patient connected to nasal cannula oxygen  Post-op Assessment: Report given to RN and Post -op Vital signs reviewed and stable  Post vital signs: stable  Last Vitals:  Vitals:   09/24/16 0729 09/24/16 0730  BP:    Pulse: 70 73  Resp: (!) 23 (!) 21  Temp:      Last Pain:  Vitals:   09/24/16 0610  TempSrc: Oral         Complications: No apparent anesthesia complications

## 2016-09-24 NOTE — Interval H&P Note (Signed)
History and Physical Interval Note:  09/24/2016 7:32 AM  Denise [name]  has presented today for surgery, with the diagnosis of SEVERE AS  The various methods of treatment have been discussed with the patient and family. After consideration of risks, benefits and other options for treatment, the patient has consented to  Procedure(s): TRANSCATHETER AORTIC VALVE REPLACEMENT, TRANSFEMORAL (N/A) TRANSESOPHAGEAL ECHOCARDIOGRAM (TEE) (N/A) as a surgical intervention .  The patient's history has been reviewed, patient examined, no change in status, stable for surgery.  I have reviewed the patient's chart and labs.  Questions were answered to the patient's satisfaction.    Pt has undergone full TAVR evaluation since the time of my evaluation. See Dr Vivi Martens full consultation note above summarizing her studies. We plan to proceed with transfemoral TAVR using a 23 mm Sapien 3 valve via the right femoral artery. Her family is at the bedside this morning. No change in her clinical status. All questions are answered.   Denise [name]

## 2016-09-24 NOTE — Op Note (Signed)
  [facility]   MULTIDISCIPLINARY HEART VALVE TEAM   TAVR OPERATIVE NOTE Date of Procedure:  09/24/2016  Preoperative Diagnosis: Severe Aortic Stenosis   Postoperative Diagnosis: Same   Procedure:    Transcatheter Aortic Valve Replacement - Percutaneous Transfemoral Approach  Edwards Sapien 3 THV (size 23 mm, model # 9600TFX, serial # [id number])   Co-Surgeons:  Valentina Gu. Roxy Manns, MD and Sherren Mocha, MD  Anesthesiologist:  Rica Koyanagi, MD  Echocardiographer:  Jenkins Rouge, MD  Pre-operative Echo Findings:  Severe aortic stenosis  Normal left ventricular systolic function  Mild MR  Post-operative Echo Findings:  No paravalvular leak  Normal/unchanged left ventricular systolic function  DETAILS OF THE OPERATIVE PROCEDURE  Please see the complete note of Dr Roxy Manns for full details of the operation.    PERIPHERAL ACCESS:    Using the modified Seldinger technique, femoral arterial and venous access was obtained with placement of 6 Fr sheaths on the left side.  A pigtail diagnostic catheter was passed through the left femoral arterial sheath under fluoroscopic guidance into the aortic root.  A temporary transvenous pacemaker catheter was passed through the left femoral venous sheath under fluoroscopic guidance into the right ventricle.  The pacemaker was tested to ensure stable lead placement and pacemaker capture. Aortic root angiography was performed in order to determine the optimal angiographic angle for valve deployment.   TRANSFEMORAL ACCESS:  A micropuncture technique is used to access the right femoral artery under fluoroscopic and ultrasound guidance. XRay is used to confirm the puncture site is over the femoral head. 2 Perclose devices are deployed at 10' and 2' positions to 'PreClose' the femoral artery. An 8 French sheath is placed and then an Amplatz Superstiff wire is advanced through the sheath. This is changed out for a 14 French transfemoral  E-Sheath after progressively dilating over the Superstiff wire.     PROCEDURE COMPLETION:  The sheath was removed and femoral artery closure is performed using the 2 previously deployed Perclose devices.  There is complete hemostasis after the sutures are tightened. Protamine was administered once femoral arterial repair was complete. The temporary pacemaker, pigtail catheters and femoral sheaths were removed with manual pressure used for hemostasis.   The patient tolerated the procedure well and is transported to the surgical intensive care in stable condition. There were no immediate intraoperative complications. All sponge instrument and needle counts are verified correct at completion of the operation.   The patient received a total of 81 mL of intravenous contrast during the procedure.   Sherren Mocha, MD 09/24/2016 10:05 AM

## 2016-09-24 NOTE — Progress Notes (Signed)
Patient ID: Denise [name], female   DOB: Oct 25, 1945, 71 y.o.   MRN: [id number]  SICU Evening Rounds:   Hemodynamically stable  Sinus 70's  Urine output good  Groin sites ok  CBC    Component Value Date/Time   WBC 8.4 09/24/2016 1030   RBC 4.28 09/24/2016 1030   HGB 11.9 (L) 09/24/2016 1041   HGB 13.8 09/27/2015 0840   HCT 35.0 (L) 09/24/2016 1041   HCT 42.3 08/22/2016 1232   HCT 42.7 09/27/2015 0840   PLT 172 09/24/2016 1030   PLT 193 08/22/2016 1232   MCV 81.8 09/24/2016 1030   MCV 80 08/22/2016 1232   MCV 82.9 09/27/2015 0840   MCH 26.6 09/24/2016 1030   MCHC 32.6 09/24/2016 1030   RDW 15.5 09/24/2016 1030   RDW 15.3 08/22/2016 1232   RDW 15.3 (H) 09/27/2015 0840   LYMPHSABS 2.2 12/28/2015 0943   LYMPHSABS 3.5 (H) 09/27/2015 0840   MONOABS 0.7 12/28/2015 0943   MONOABS 0.6 09/27/2015 0840   EOSABS 0.2 12/28/2015 0943   EOSABS 0.3 09/27/2015 0840   BASOSABS 0.1 12/28/2015 0943   BASOSABS 0.1 09/27/2015 0840     BMET    Component Value Date/Time   NA 145 09/24/2016 1041   NA 149 (H) 08/22/2016 1232   NA 145 09/27/2015 0840   K 3.1 (L) 09/24/2016 1041   K 3.7 09/27/2015 0840   CL 107 09/24/2016 0946   CO2 22 09/20/2016 1119   CO2 27 09/27/2015 0840   GLUCOSE 150 (H) 09/24/2016 1041   GLUCOSE 136 09/27/2015 0840   BUN 9 09/24/2016 0946   BUN 11 08/22/2016 1232   BUN 8.8 09/27/2015 0840   CREATININE 0.60 09/24/2016 0946   CREATININE 0.8 09/27/2015 0840   CALCIUM 9.4 09/20/2016 1119   CALCIUM 9.2 09/27/2015 0840   GFRNONAA >60 09/20/2016 1119   GFRAA >60 09/20/2016 1119     A/P:  Stable postop course. Continue current plans

## 2016-09-25 ENCOUNTER — Inpatient Hospital Stay (HOSPITAL_COMMUNITY): Payer: Medicare Other

## 2016-09-25 ENCOUNTER — Encounter (HOSPITAL_COMMUNITY): Payer: Self-pay | Admitting: Cardiovascular Disease

## 2016-09-25 DIAGNOSIS — I340 Nonrheumatic mitral (valve) insufficiency: Secondary | ICD-10-CM

## 2016-09-25 DIAGNOSIS — I350 Nonrheumatic aortic (valve) stenosis: Secondary | ICD-10-CM

## 2016-09-25 DIAGNOSIS — Z954 Presence of other heart-valve replacement: Secondary | ICD-10-CM

## 2016-09-25 DIAGNOSIS — Z952 Presence of prosthetic heart valve: Secondary | ICD-10-CM

## 2016-09-25 LAB — CBC
HCT: 35.6 % — ABNORMAL LOW (ref 36.0–46.0)
Hemoglobin: 11.4 g/dL — ABNORMAL LOW (ref 12.0–15.0)
MCH: 26.3 pg (ref 26.0–34.0)
MCHC: 32 g/dL (ref 30.0–36.0)
MCV: 82 fL (ref 78.0–100.0)
Platelets: 164 10*3/uL (ref 150–400)
RBC: 4.34 MIL/uL (ref 3.87–5.11)
RDW: 15.2 % (ref 11.5–15.5)
WBC: 10.1 10*3/uL (ref 4.0–10.5)

## 2016-09-25 LAB — BASIC METABOLIC PANEL
ANION GAP: 6 (ref 5–15)
CALCIUM: 8.4 mg/dL — AB (ref 8.9–10.3)
CO2: 27 mmol/L (ref 22–32)
Chloride: 108 mmol/L (ref 101–111)
Creatinine, Ser: 0.59 mg/dL (ref 0.44–1.00)
GFR calc Af Amer: >60 mL/min (ref >60)
GLUCOSE: 97 mg/dL (ref 65–99)
Potassium: 3.2 mmol/L — ABNORMAL LOW (ref 3.5–5.1)
Sodium: 141 mmol/L (ref 135–145)

## 2016-09-25 LAB — GLUCOSE, CAPILLARY
GLUCOSE-CAPILLARY: 118 mg/dL — AB (ref 65–99)
GLUCOSE-CAPILLARY: 137 mg/dL — AB (ref 65–99)
Glucose-Capillary: 150 mg/dL — ABNORMAL HIGH (ref 65–99)

## 2016-09-25 LAB — ECHOCARDIOGRAM COMPLETE
Height: 61 in
WEIGHTICAEL: 3604.96 [oz_av]

## 2016-09-25 LAB — MAGNESIUM: Magnesium: 1.5 mg/dL — ABNORMAL LOW (ref 1.7–2.4)

## 2016-09-25 MED ORDER — SODIUM CHLORIDE 0.9 % IV SOLN: 250.0000 mL | INTRAVENOUS | Status: DC | PRN

## 2016-09-25 MED ORDER — METFORMIN HCL ER 500 MG PO TB24
500.0000 mg | ORAL_TABLET | Freq: Every day | ORAL | Status: DC
  Administered 2016-09-26: 500 mg via ORAL
  Filled 2016-09-25: qty 1

## 2016-09-25 MED ORDER — POTASSIUM CHLORIDE CRYS ER 20 MEQ PO TBCR
20.0000 meq | EXTENDED_RELEASE_TABLET | Freq: Every day | ORAL | Status: DC
  Administered 2016-09-25 – 2016-09-26 (×2): 20 meq via ORAL
  Filled 2016-09-25 (×2): qty 1

## 2016-09-25 MED ORDER — BUDESONIDE 0.25 MG/2ML IN SUSP
0.2500 mg | Freq: Two times a day (BID) | RESPIRATORY_TRACT | Status: DC
  Administered 2016-09-25 – 2016-09-26 (×3): 0.25 mg via RESPIRATORY_TRACT
  Filled 2016-09-25 (×2): qty 2

## 2016-09-25 MED ORDER — SODIUM CHLORIDE 0.9% FLUSH
3.0000 mL | Freq: Two times a day (BID) | INTRAVENOUS | Status: DC
  Administered 2016-09-25 – 2016-09-26 (×2): 3 mL via INTRAVENOUS

## 2016-09-25 MED ORDER — GLIMEPIRIDE 4 MG PO TABS
2.0000 mg | ORAL_TABLET | Freq: Every day | ORAL | Status: DC
  Administered 2016-09-26: 2 mg via ORAL
  Filled 2016-09-25: qty 1

## 2016-09-25 MED ORDER — MOVING RIGHT ALONG BOOK
Freq: Once | Status: AC
  Administered 2016-09-25: 08:00:00
  Filled 2016-09-25: qty 1

## 2016-09-25 MED ORDER — POTASSIUM CHLORIDE 10 MEQ/50ML IV SOLN
10.0000 meq | INTRAVENOUS | Status: DC | PRN
  Administered 2016-09-25: 10 meq via INTRAVENOUS
  Filled 2016-09-25 (×4): qty 50

## 2016-09-25 MED ORDER — FUROSEMIDE 40 MG PO TABS
40.0000 mg | ORAL_TABLET | Freq: Two times a day (BID) | ORAL | Status: DC
  Administered 2016-09-25 – 2016-09-26 (×3): 40 mg via ORAL
  Filled 2016-09-25 (×3): qty 1

## 2016-09-25 MED ORDER — SODIUM CHLORIDE 0.9% FLUSH: 3.0000 mL | INTRAVENOUS | Status: DC | PRN

## 2016-09-25 MED FILL — Sodium Chloride IV Soln 0.9%: INTRAVENOUS | Qty: 100 | Status: AC

## 2016-09-25 MED FILL — Phenylephrine HCl Inj 10 MG/ML: INTRAMUSCULAR | Qty: 2 | Status: AC

## 2016-09-25 MED FILL — Insulin Regular (Human) Inj 100 Unit/ML: INTRAMUSCULAR | Qty: 1 | Status: AC

## 2016-09-25 NOTE — Progress Notes (Signed)
Progress Note  Patient Name: Denise [name] Date of Encounter: 09/25/2016  Primary Cardiologist: Oval Linsey  Subjective   The patient is feeling well this morning. She is sitting up in a chair at the bedside. No chest pain or shortness of breath.  Inpatient Medications    Scheduled Meds: . amLODipine  10 mg Oral Daily  . anastrozole  1 mg Oral Daily  . aspirin  81 mg Oral Daily  . atorvastatin  40 mg Oral q1800  . chlorhexidine  15 mL Mouth/Throat NOW  . Chlorhexidine Gluconate Cloth  6 each Topical Daily  . clopidogrel  75 mg Oral Q breakfast  . metoprolol tartrate  12.5 mg Oral BID   Or  . metoprolol tartrate  12.5 mg Per Tube BID  . mupirocin ointment  1 application Nasal BID  . [START ON 09/26/2016] pantoprazole  40 mg Oral Daily  . sodium chloride flush  10-40 mL Intracatheter Q12H   Continuous Infusions: . albumin human    . cefUROXime (ZINACEF)  IV Stopped (09/25/16 0343)  . nitroGLYCERIN    . phenylephrine (NEO-SYNEPHRINE) Adult infusion    . potassium chloride 10 mEq (09/25/16 0601)   PRN Meds: albumin human, metoprolol tartrate, midazolam, morphine injection, ondansetron (ZOFRAN) IV, oxyCODONE, potassium chloride, sodium chloride flush, traMADol   Vital Signs    Vitals:   09/25/16 0300 09/25/16 0400 09/25/16 0500 09/25/16 0600  BP: (!) 118/47 (!) 118/49 (!) 118/53 (!) 123/58  Pulse: (!) 59 61 (!) 59 61  Resp:   (!) 22 (!) 22  Temp: 98.7 F (37.1 C)     TempSrc: Oral     SpO2: 97% 99% 97% 99%  Weight:   225 lb 5 oz (102.2 kg)   Height:        Intake/Output Summary (Last 24 hours) at 09/25/16 0636 Last data filed at 09/25/16 0313  Gross per 24 hour  Intake           3174.9 ml  Output             4200 ml  Net          -1025.1 ml   Filed Weights   09/24/16 0610 09/25/16 0500  Weight: 226 lb 13.7 oz (102.9 kg) 225 lb 5 oz (102.2 kg)    Telemetry    Sinus rhythm/sinus bradycardia without significant arrhythmia - Personally Reviewed  ECG      Sinus bradycardia 55 bpm, otherwise within normal limits. - Personally Reviewed  Physical Exam  My exam today: Vitals:   09/25/16 0800 09/25/16 0900  BP: (!) 127/50 138/62  Pulse: (!) 55 64  Resp: 16 (!) 23  Temp:     Pt is alert and oriented, NAD HEENT: normal Neck: JVP - normal Lungs: CTA bilaterally CV: RRR with 2/6 SEM at the RUSB Abd: soft, NT, Positive BS, no hepatomegaly Ext: no C/C/E, distal pulses intact and equal, BL groin sites clear Skin: warm/dry no rash  Labs    Chemistry Recent Labs Lab 09/20/16 1119  09/24/16 0918 09/24/16 0946 09/24/16 1041 09/25/16 0330  NA 139  < > 145 144 145 141  K 3.6  < > 2.9* 3.0* 3.1* 3.2*  CL 107  < > 107 107  --  108  CO2 22  --   --   --   --  27  GLUCOSE 96  < > 169* 166* 150* 97  BUN 10  < > 10 9  --  <5*  CREATININE  0.62  < > 0.50 0.60  --  0.59  CALCIUM 9.4  --   --   --   --  8.4*  PROT 7.1  --   --   --   --   --   ALBUMIN 4.1  --   --   --   --   --   AST 23  --   --   --   --   --   ALT 21  --   --   --   --   --   ALKPHOS 62  --   --   --   --   --   BILITOT 0.7  --   --   --   --   --   GFRNONAA >60  --   --   --   --  >60  GFRAA >60  --   --   --   --  >60  ANIONGAP 10  --   --   --   --  6  < > = values in this interval not displayed.   Hematology Recent Labs Lab 09/20/16 1119  09/24/16 1030 09/24/16 1041 09/25/16 0330  WBC 8.6  --  8.4  --  10.1  RBC 4.86  --  4.28  --  4.34  HGB 12.8  < > 11.4* 11.9* 11.4*  HCT 39.2  < > 35.0* 35.0* 35.6*  MCV 80.7  --  81.8  --  82.0  MCH 26.3  --  26.6  --  26.3  MCHC 32.7  --  32.6  --  32.0  RDW 14.9  --  15.5  --  15.2  PLT 193  --  172  --  164  < > = values in this interval not displayed.  Cardiac EnzymesNo results for input(s): TROPONINI in the last 168 hours. No results for input(s): TROPIPOC in the last 168 hours.   BNPNo results for input(s): BNP, PROBNP in the last 168 hours.   DDimer No results for input(s): DDIMER in the last 168 hours.    Radiology    Dg Chest Port 1 View  Result Date: 09/24/2016 CLINICAL DATA:  Postop chest EXAM: PORTABLE CHEST 1 VIEW COMPARISON:  Portable exam 1027 hours compared to 09/20/2016 FINDINGS: RIGHT jugular central venous catheter with tip projecting over SVC. Enlargement of cardiac silhouette post TAVR. Atherosclerotic calcification aorta. Minimal bibasilar atelectasis. Lungs otherwise clear. No pleural effusion or pneumothorax. Bones demineralized. IMPRESSION: Enlargement of cardiac silhouette post TAVR. Minimal bibasilar atelectasis. Electronically Signed   By: Lavonia Dana M.D.   On: 09/24/2016 12:20    Cardiac Studies   POD #1 echo pending  Patient Profile     71 y.o. female with severe, stage D, aortic stenosis,presents for TAVR 09/24/2016  Assessment & Plan    1. Severe aortic stenosis s/p TAVR: POD #1: The patient is doing well with no early apparent complications from TAVR. She will be transferred to a telemetry bed today and anticipate hospital discharge tomorrow. Postoperative echo to be done today. Begin aspirin and Plavix.  2. Type II DM: Controlled  3. HTN: Blood pressure in normal range today. Amlodipine is prescribed as one of her home medicines. We'll hold lisinopril to avoid hypotension.  Disposition: Transfer to telemetry today, likely discharge home tomorrow morning. Otherwise as per Dr. Roxy Manns.  Deatra James, MD  09/25/2016, 6:36 AM

## 2016-09-25 NOTE — Progress Notes (Signed)
Transferred to [id number] via wheelchair. Portable monitor on. No changes.

## 2016-09-25 NOTE — Progress Notes (Signed)
      [address]       [place] [address]             [phone]        CARDIOTHORACIC SURGERY PROGRESS NOTE   R1 Day Post-Op Procedure(s) (LRB): TRANSCATHETER AORTIC VALVE REPLACEMENT, TRANSFEMORAL (N/A) TRANSESOPHAGEAL ECHOCARDIOGRAM (TEE) (N/A)  Subjective: Looks good and feels well  Objective: Vital signs: BP Readings from Last 1 Encounters:  09/25/16 (!) 123/53   Pulse Readings from Last 1 Encounters:  09/25/16 (!) 57   Resp Readings from Last 1 Encounters:  09/25/16 16   Temp Readings from Last 1 Encounters:  09/25/16 99.1 F (37.3 C) (Oral)    Hemodynamics:    Physical Exam:  Rhythm:   sinus  Breath sounds: clear  Heart sounds:  RRR w/out murmur  Incisions:  Both groins okay  Abdomen:  Soft, non-distended, non-tender  Extremities:  Warm, well-perfused   Intake/Output from previous day: 05/29 0701 - 05/30 0700 In: 3224.9 [P.O.:480; I.V.:2244.9; IV Piggyback:500] Out: 4350 [Urine:4325; Blood:25] Intake/Output this shift: No intake/output data recorded.  Lab Results:  CBC: Recent Labs  09/24/16 1030 09/24/16 1041 09/25/16 0330  WBC 8.4  --  10.1  HGB 11.4* 11.9* 11.4*  HCT 35.0* 35.0* 35.6*  PLT 172  --  164    BMET:  Recent Labs  09/24/16 0946 09/24/16 1041 09/25/16 0330  NA 144 145 141  K 3.0* 3.1* 3.2*  CL 107  --  108  CO2  --   --  27  GLUCOSE 166* 150* 97  BUN 9  --  <5*  CREATININE 0.60  --  0.59  CALCIUM  --   --  8.4*     PT/INR:   Recent Labs  09/24/16 1030  LABPROT 13.7  INR 1.05    CBG (last 3)   Recent Labs  09/24/16 0618  GLUCAP 107*    ABG    Component Value Date/Time   PHART 7.399 09/24/2016 1037   PCO2ART 41.3 09/24/2016 1037   PO2ART 113.0 (H) 09/24/2016 1037   HCO3 25.7 09/24/2016 1037   TCO2 27 09/24/2016 1037   ACIDBASEDEF 1.0 09/04/2016 1030   O2SAT 99.0 09/24/2016 1037    CXR: Clear  EKG: NSR w/out acute ischemic changes    Assessment/Plan: S/P Procedure(s)  (LRB): TRANSCATHETER AORTIC VALVE REPLACEMENT, TRANSFEMORAL (N/A) TRANSESOPHAGEAL ECHOCARDIOGRAM (TEE) (N/A)  Doing well POD1 Maintaining NSR w/ stable BP Breathing comfortably w/ O2 sats 96-100% on 2 L/min Hypertension well controlled Chronic diastolic CHF with expected post-op volume excess, mild Type II diabetes mellitus, excellent glycemic control   Mobilize  D/C lines  Restart Norvasc and hold ACE-I today - possibly restart tomorrow depending on BP  Continue SSI and restart oral agents for DMII tomorrow if eating well  ASA and Plavix x 6 months  Routine ECHO  Transfer tele  Anticipate likely d/c home tomorrow  Rexene Alberts, MD 09/25/2016 8:12 AM

## 2016-09-25 NOTE — Progress Notes (Signed)
Came to ambulate however pt sts she just finished vomiting and still feels nauseated. Sts she did walk to BR earlier. Will f/u in am. Yves Dill CES, ACSM 2:26 PM 09/25/2016

## 2016-09-25 NOTE — Progress Notes (Signed)
  Echocardiogram 2D Echocardiogram has been performed.  Matilde Bash 09/25/2016, 4:31 PM

## 2016-09-26 ENCOUNTER — Telehealth: Payer: Self-pay | Admitting: *Deleted

## 2016-09-26 DIAGNOSIS — I350 Nonrheumatic aortic (valve) stenosis: Secondary | ICD-10-CM

## 2016-09-26 DIAGNOSIS — Z954 Presence of other heart-valve replacement: Secondary | ICD-10-CM

## 2016-09-26 LAB — GLUCOSE, CAPILLARY
GLUCOSE-CAPILLARY: 115 mg/dL — AB (ref 65–99)
Glucose-Capillary: 105 mg/dL — ABNORMAL HIGH (ref 65–99)

## 2016-09-26 MED ORDER — ATORVASTATIN CALCIUM 40 MG PO TABS: 40.0000 mg | ORAL_TABLET | Freq: Every day | ORAL | Status: DC

## 2016-09-26 MED ORDER — LISINOPRIL 20 MG PO TABS: 20.0000 mg | ORAL_TABLET | Freq: Every day | ORAL | 3 refills | Status: DC

## 2016-09-26 MED ORDER — CLOPIDOGREL BISULFATE 75 MG PO TABS: 75.0000 mg | ORAL_TABLET | Freq: Every day | ORAL | 3 refills | Status: DC

## 2016-09-26 MED ORDER — LISINOPRIL 40 MG PO TABS: 40.0000 mg | ORAL_TABLET | Freq: Every day | ORAL | Status: DC

## 2016-09-26 MED ORDER — LISINOPRIL 10 MG PO TABS
20.0000 mg | ORAL_TABLET | Freq: Every day | ORAL | Status: DC
  Administered 2016-09-26: 20 mg via ORAL
  Filled 2016-09-26: qty 2

## 2016-09-26 MED ORDER — TRAMADOL HCL 50 MG PO TABS: 50.0000 mg | ORAL_TABLET | ORAL | 0 refills | Status: DC | PRN

## 2016-09-26 NOTE — Progress Notes (Signed)
Progress Note  Patient Name: Denise [name] Date of Encounter: 09/26/2016  Primary Cardiologist: Oval Linsey  Subjective   Feeling okay this morning. Her daughter is at the bedside. No chest pain or shortness of breath. Had a lot of nausea yesterday.  Inpatient Medications    Scheduled Meds: . amLODipine  10 mg Oral Daily  . anastrozole  1 mg Oral Daily  . aspirin  81 mg Oral Daily  . atorvastatin  40 mg Oral q1800  . budesonide (PULMICORT) nebulizer solution  0.25 mg Nebulization BID  . Chlorhexidine Gluconate Cloth  6 each Topical Daily  . clopidogrel  75 mg Oral Q breakfast  . furosemide  40 mg Oral BID  . glimepiride  2 mg Oral Daily  . metFORMIN  500 mg Oral Q breakfast  . mupirocin ointment  1 application Nasal BID  . pantoprazole  40 mg Oral Daily  . potassium chloride  20 mEq Oral Daily  . sodium chloride flush  10-40 mL Intracatheter Q12H  . sodium chloride flush  3 mL Intravenous Q12H   Continuous Infusions: . sodium chloride    . potassium chloride Stopped (09/25/16 0843)   PRN Meds: sodium chloride, metoprolol tartrate, morphine injection, ondansetron (ZOFRAN) IV, oxyCODONE, potassium chloride, sodium chloride flush, sodium chloride flush, traMADol   Vital Signs    Vitals:   09/25/16 1100 09/25/16 1946 09/25/16 2148 09/26/16 0401  BP: (!) 136/52 (!) 161/71  131/63  Pulse: (!) 58 65    Resp: 15 18  17   Temp: 98.2 F (36.8 C) 98.6 F (37 C)  98.2 F (36.8 C)  TempSrc: Oral Oral  Oral  SpO2: 96% 96% 97% 96%  Weight:    229 lb 8 oz (104.1 kg)  Height:        Intake/Output Summary (Last 24 hours) at 09/26/16 0601 Last data filed at 09/25/16 1700  Gross per 24 hour  Intake              120 ml  Output              150 ml  Net              -30 ml   Filed Weights   09/24/16 0610 09/25/16 0500 09/26/16 0401  Weight: 226 lb 13.7 oz (102.9 kg) 225 lb 5 oz (102.2 kg) 229 lb 8 oz (104.1 kg)    Telemetry    Sinus bradycardia without significant  arrhythmia- Personally Reviewed   Physical Exam  My exam today: Vitals:   09/25/16 1946 09/26/16 0401  BP: (!) 161/71 131/63  Pulse: 65   Resp: 18 17  Temp: 98.6 F (37 C) 98.2 F (36.8 C)   Pt is alert and oriented, NAD HEENT: normal Neck: JVP - normal Lungs: CTA bilaterally CV: RRR with 2/6 SEM at the RUSB Abd: soft, NT, Positive BS, no hepatomegaly Ext: no C/C/E, distal pulses intact and equal Skin: warm/dry no rash   Labs    Chemistry Recent Labs Lab 09/20/16 1119  09/24/16 0918 09/24/16 0946 09/24/16 1041 09/25/16 0330  NA 139  < > 145 144 145 141  K 3.6  < > 2.9* 3.0* 3.1* 3.2*  CL 107  < > 107 107  --  108  CO2 22  --   --   --   --  27  GLUCOSE 96  < > 169* 166* 150* 97  BUN 10  < > 10 9  --  <5*  CREATININE  0.62  < > 0.50 0.60  --  0.59  CALCIUM 9.4  --   --   --   --  8.4*  PROT 7.1  --   --   --   --   --   ALBUMIN 4.1  --   --   --   --   --   AST 23  --   --   --   --   --   ALT 21  --   --   --   --   --   ALKPHOS 62  --   --   --   --   --   BILITOT 0.7  --   --   --   --   --   GFRNONAA >60  --   --   --   --  >60  GFRAA >60  --   --   --   --  >60  ANIONGAP 10  --   --   --   --  6  < > = values in this interval not displayed.   Hematology Recent Labs Lab 09/20/16 1119  09/24/16 1030 09/24/16 1041 09/25/16 0330  WBC 8.6  --  8.4  --  10.1  RBC 4.86  --  4.28  --  4.34  HGB 12.8  < > 11.4* 11.9* 11.4*  HCT 39.2  < > 35.0* 35.0* 35.6*  MCV 80.7  --  81.8  --  82.0  MCH 26.3  --  26.6  --  26.3  MCHC 32.7  --  32.6  --  32.0  RDW 14.9  --  15.5  --  15.2  PLT 193  --  172  --  164  < > = values in this interval not displayed.  Cardiac EnzymesNo results for input(s): TROPONINI in the last 168 hours. No results for input(s): TROPIPOC in the last 168 hours.   BNPNo results for input(s): BNP, PROBNP in the last 168 hours.   DDimer No results for input(s): DDIMER in the last 168 hours.   Radiology    Dg Chest Port 1  View  Result Date: 09/25/2016 CLINICAL DATA:  71 year old female status post TAVR on 09/24/2016. EXAM: PORTABLE CHEST 1 VIEW COMPARISON:  09/24/2016 and earlier. FINDINGS: Portable AP semi upright view at 0553 hours. Stable right IJ central line. No pneumothorax. Stable cardiac size and mediastinal contours. Aortic valve prosthesis configuration appears stable. Calcified aortic atherosclerosis. No pneumothorax, pulmonary edema, pleural effusion or confluent pulmonary opacity. Stable right chest wall surgical clips. IMPRESSION: Stable.  No acute cardiopulmonary abnormality. Electronically Signed   By: Genevie Ann M.D.   On: 09/25/2016 07:38   Dg Chest Port 1 View  Result Date: 09/24/2016 CLINICAL DATA:  Postop chest EXAM: PORTABLE CHEST 1 VIEW COMPARISON:  Portable exam 1027 hours compared to 09/20/2016 FINDINGS: RIGHT jugular central venous catheter with tip projecting over SVC. Enlargement of cardiac silhouette post TAVR. Atherosclerotic calcification aorta. Minimal bibasilar atelectasis. Lungs otherwise clear. No pleural effusion or pneumothorax. Bones demineralized. IMPRESSION: Enlargement of cardiac silhouette post TAVR. Minimal bibasilar atelectasis. Electronically Signed   By: Lavonia Dana M.D.   On: 09/24/2016 12:20    Cardiac Studies   POD #1 echo: Study Conclusions  - Left ventricle: The cavity size was normal. Systolic function was   normal. The estimated ejection fraction was in the range of 60%   to 65%. Wall motion was normal; there were no regional  wall   motion abnormalities. - Aortic valve: A bioprosthesis was present. There was trivial   regurgitation. Valve area (VTI): 1.25 cm^2. Valve area (Vmax):   1.14 cm^2. Valve area (Vmean): 1.32 cm^2. - Mitral valve: Calcified annulus. There was mild regurgitation.  Impressions:  - Limited study s/p TAVR; normal LV systolic function; s/p AVR with   normal mean gradient of 18 mmHg and trace AI; mild  MR.  ------------------------------------------------------------------- Study data:  Comparison was made to the study of 09/24/2016.  Study status:  Routine.  Procedure:  The patient reported no pain pre or post test. Transthoracic echocardiography. Image quality was adequate.          Transthoracic echocardiography.  M-mode, complete 2D, spectral Doppler, and color Doppler.  Birthdate: Patient birthdate: March 09, 1946.  Age:  Patient is 71 yr old.  Sex: Gender: female.  Blood pressure:     136/52  Patient status: Inpatient.  Study date:  Study date: 09/25/2016. Study time: 04:04 PM.  Location:  Bedside.  -------------------------------------------------------------------  ------------------------------------------------------------------- Left ventricle:  The cavity size was normal. Systolic function was normal. The estimated ejection fraction was in the range of 60% to 65%. Wall motion was normal; there were no regional wall motion abnormalities.  ------------------------------------------------------------------- Aortic valve:  A bioprosthesis was present.  Doppler: Transvalvular velocity was within the normal range. There was no stenosis. There was trivial regurgitation.    VTI ratio of LVOT to aortic valve: 0.4. Valve area (VTI): 1.25 cm^2. Peak velocity ratio of LVOT to aortic valve: 0.36. Valve area (Vmax): 1.14 cm^2. Mean velocity ratio of LVOT to aortic valve: 0.42. Valve area (Vmean): 1.32 cm^2.    Mean gradient (S): 18 mm Hg. Peak gradient (S): 38 mm Hg.  ------------------------------------------------------------------- Aorta:  Aortic root: The aortic root was normal in size.  ------------------------------------------------------------------- Mitral valve:   Calcified annulus. Mobility was not restricted. Doppler:  There was mild regurgitation.  ------------------------------------------------------------------- Left atrium:  The atrium was normal in  size.  ------------------------------------------------------------------- Right ventricle:  The cavity size was normal. Systolic function was normal.  ------------------------------------------------------------------- Tricuspid valve:   Structurally normal valve.  ------------------------------------------------------------------- Right atrium:  The atrium was normal in size.  ------------------------------------------------------------------- Pericardium:  There was no pericardial effusion.  ------------------------------------------------------------------- Measurements   Left ventricle                       Value  Stroke volume, 2D                    83    ml    LVOT                                 Value  LVOT ID, S                           20    mm  LVOT area                            3.14  cm^2  LVOT peak velocity, S                112   cm/s  LVOT mean velocity, S                80.1  cm/s  LVOT VTI, S  26.3  cm  LVOT peak gradient, S                5     mm Hg    Aortic valve                         Value  Aortic valve peak velocity, S        309   cm/s  Aortic valve mean velocity, S        191   cm/s  Aortic valve VTI, S                  66.2  cm  Aortic mean gradient, S              18    mm Hg  Aortic peak gradient, S              38    mm Hg  VTI ratio, LVOT/AV                   0.4  Aortic valve area, VTI               1.25  cm^2  Velocity ratio, peak, LVOT/AV        0.36  Aortic valve area, peak velocity     1.14  cm^2  Velocity ratio, mean, LVOT/AV        0.42  Aortic valve area, mean velocity     1.32  cm^2  Patient Profile     71 y.o. female with severe, stage D, aortic stenosis,presents for TAVR 09/24/2016  Assessment & Plan    1. Severe aortic stenosis s/p TAVR: POD #2: Post-op echo reviewed and demonstrates normal THV function with a mean gradient of 18 mmHg and trivial PVL. Continue aspirin indefinitely and Plavix  for 6 months. Valve clinic follow-up will be arranged in one month.  2. Type II DM: Controlled  3. HTN: on amlodipine in hospital. BP in normal range. Probably ok to resume ACE at discharge.   Disposition: Likely home today if tolerating PO. Otherwise as per Dr Rosana Fret, MD  09/26/2016, 6:01 AM

## 2016-09-26 NOTE — Discharge Summary (Signed)
Physician Discharge Summary  Patient ID: Denise [name] MRN: [id number] DOB/AGE: 71-Feb-1947 71 y.o.  Admit date: 09/24/2016 Discharge date: 09/26/2016  Admission Diagnoses:  Patient Active Problem List   Diagnosis Date Noted  . Limited mobility   . Physical deconditioning   . 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 Severe aortic stenosis 07/19/2016  . Heart murmur 06/26/2016  . Neck pain 12/27/2015  . Low back pain 12/27/2015  . Wheezing 11/14/2015  . Cough 11/14/2015  . Breast cancer of upper-outer quadrant of right female breast ([place]) 09/20/2015  . Asthma with exacerbation 08/23/2015  . Spinal stenosis of lumbar region 08/22/2014  . Acute bronchitis 06/07/2014  . Eustachian tube dysfunction 03/15/2013  . Sprain of ankle, unspecified site 02/17/2013  . Peripheral edema 01/06/2012  . Nocturnal leg cramps 08/06/2011  . 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 Preop examination 09/03/2010  . Left knee DJD 09/03/2010  . Fatigue 09/03/2010  . Preventative health care 08/30/2010  . 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 Diabetes ([place]) 08/24/2007  . Hyperlipidemia 08/24/2007  . OBESITY 08/24/2007  . ANXIETY 08/24/2007  . DEPRESSION 08/24/2007  . Essential hypertension 08/24/2007  . Allergic rhinitis 08/24/2007  . Asthma 08/24/2007  . GERD 08/24/2007  . POSTMENOPAUSAL STATUS 08/24/2007  . ECZEMA 08/24/2007  . DEGENERATIVE JOINT DISEASE 08/24/2007  . SPINAL STENOSIS 08/24/2007  . Edema 08/24/2007   Discharge Diagnoses:   Patient Active Problem List   Diagnosis Date Noted  . 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 Limited mobility   . Physical deconditioning   . 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 Severe aortic stenosis 07/19/2016  . Heart murmur 06/26/2016  . Neck pain 12/27/2015  . Low back pain 12/27/2015  . Wheezing 11/14/2015  . Cough 11/14/2015  . Breast cancer of upper-outer quadrant of right female breast ([place]) 09/20/2015  .  Asthma with exacerbation 08/23/2015  . Spinal stenosis of lumbar region 08/22/2014  . Acute bronchitis 06/07/2014  . Eustachian tube dysfunction 03/15/2013  . Sprain of ankle, unspecified site 02/17/2013  . Peripheral edema 01/06/2012  . Nocturnal leg cramps 08/06/2011  . 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 Preop examination 09/03/2010  . Left knee DJD 09/03/2010  . Fatigue 09/03/2010  . Preventative health care 08/30/2010  . 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 Diabetes ([place]) 08/24/2007  . Hyperlipidemia 08/24/2007  . OBESITY 08/24/2007  . ANXIETY 08/24/2007  . DEPRESSION 08/24/2007  . Essential hypertension 08/24/2007  . Allergic rhinitis 08/24/2007  . Asthma 08/24/2007  . GERD 08/24/2007  . POSTMENOPAUSAL STATUS 08/24/2007  . ECZEMA 08/24/2007  . DEGENERATIVE JOINT DISEASE 08/24/2007  . SPINAL STENOSIS 08/24/2007  . Edema 08/24/2007   Discharged Condition: good  History of Present Illness:  Denise [name] is a 71 year old morbidly obese African-American female with history of aortic stenosis, hypertension, insulin-dependent type 2 diabetes mellitus, hyperlipidemia, asthma, breast cancer status post lumpectomy, and degenerative disc disease of the lumbar spine status post spinal fusion on 3 previous occasions who has been referred for surgical consultation to discuss treatment options for management of severe symptomatic aortic stenosis.  Patient states that she was told she had rheumatic fever as a child and she has known of having a heart murmur for many years. She otherwise has no previous cardiac history. She was recently seen in follow-up by her primary care physician noticed a significant change in her heart murmur. Transthoracic echocardiogram was performed demonstrating the presence of severe aortic stenosis with preserved left ventricular systolic function. She was  referred for cardiology consultation and initially evaluated by Dr. Oval Linsey on 08/19/2016. She was  noted to complain of a long history of exertional shortness of breath that had progressed somewhat over the last few years. She was referred to the multidisciplinary heart valve clinic and evaluated by Dr. Burt Knack on 08/22/2016.  She subsequently underwent left and right heart catheterization on 09/04/2016. This revealed diffuse nonobstructive coronary artery disease with normal right heart hemodynamics. CT angiography was performed and the patient was referred for surgical consultation.  She was evaluated by the TAVR clinic who felt the patient would be a good candidate for TAVR procedure.  The risks and benefits of the procedure were explained to the patient and she was agreeable to proceed.  Hospital Course:   Denise [name] presented to [facility] of 09/24/2016.  She was taken to the operating room and underwent Transcatheter Aortic Valve Replacement via Percutaneous Right Transfemoral approach with a 23 mm Edwards Sapien 3 THV.  She tolerated the procedure without difficulty and was taken to the SICU in stable condition.  The patient was extubated without difficulty.  She was maintaining NSR.  She was restarted on her home BP medications for HTN.  Echocardiogram was obtained and showed the valve prosthesis to be functioning within normal limits.  She had some mild nausea without vomiting.  This resolved.  She was ambulating without difficulty.  She was tolerating a heart healthy diet.  She is medically stable for discharge home today.   Significant Diagnostic Studies: cardiac graphics:   Echocardiogram:   Study Conclusions  - Left ventricle: The cavity size was normal. Systolic function was   normal. The estimated ejection fraction was in the range of 60%   to 65%. Wall motion was normal; there were no regional wall   motion abnormalities. - Aortic valve: A bioprosthesis was present. There was trivial   regurgitation. Valve area (VTI): 1.25 cm^2. Valve area (Vmax):   1.14 cm^2. Valve  area (Vmean): 1.32 cm^2. - Mitral valve: Calcified annulus. There was mild regurgitation.  Impressions:  - Limited study s/p TAVR; normal LV systolic function; s/p AVR with   normal mean gradient of 18 mmHg and trace AI; mild MR.  Treatments: surgery:    Transcatheter Aortic Valve Replacement - Percutaneous Right Transfemoral Approach             Edwards Sapien 3 THV (size 23 mm, model # 9600TFX, serial # [id number])  Disposition: 01-Home or Self Care   Discharge medications:  The patient has been discharged on:   1.Beta Blocker:  Yes [   ]                              No   [ x  ]                              If No, reason: Bradycardia  2.Ace Inhibitor/ARB: Yes [ x  ]                                     No  [    ]  If No, reason:  3.Statin:   Yes [   ]                  No  [ X  ]                  If No, reason: NO CAD  4.[place]:  Yes  [ x ]                  No   [   ]                  If No, reason:      Allergies as of 09/26/2016      Reactions   Clonidine Hydrochloride Other (See Comments)   Bradycardia   Erythromycin Palpitations   Hydrocodone-acetaminophen Nausea Only   Pork-derived Products Diarrhea, Nausea Only   Rosiglitazone Maleate Swelling   SWELLING REACTION UNSPECIFIED       Medication List    STOP taking these medications   oxyCODONE-acetaminophen 5-325 MG tablet Commonly known as:  PERCOCET     TAKE these medications   albuterol 108 (90 Base) MCG/ACT inhaler Commonly known as:  PROVENTIL HFA;VENTOLIN HFA Inhale 2 puffs into the lungs every 6 (six) hours as needed for wheezing or shortness of breath.   ALIVE WOMENS 50+ Tabs Take 1 tablet by mouth daily.   amLODipine 10 MG tablet Commonly known as:  NORVASC Take 1 tablet (10 mg total) by mouth daily.   anastrozole 1 MG tablet Commonly known as:  ARIMIDEX Take 1 tablet (1 mg total) by mouth daily.   aspirin 81 MG EC tablet Take 81 mg by mouth  daily.   atorvastatin 40 MG tablet Commonly known as:  LIPITOR TAKE ONE TABLET BY MOUTH ONCE DAILY.   budesonide-formoterol 160-4.5 MCG/ACT inhaler Commonly known as:  SYMBICORT Inhale 2 puffs into the lungs 2 (two) times daily.   CALCIUM 1200+D3 PO Take 1 tablet by mouth daily.   cetirizine 10 MG tablet Commonly known as:  ZYRTEC TAKE ONE TABLET BY MOUTH ONCE DAILY AS NEEDED FOR ALLERGIES   chlorhexidine 0.12 % solution Commonly known as:  PERIDEX Rinse with 15 mls twice daily for 30 seconds. Use after breakfast and at bedtime. Spit out excess. Do not swallow.   clopidogrel 75 MG tablet Commonly known as:  PLAVIX Take 1 tablet (75 mg total) by mouth daily with breakfast.   Fish Oil 1000 MG Caps Take 1,000 mg by mouth daily.   fluticasone 50 MCG/ACT nasal spray Commonly known as:  FLONASE Place 2 sprays into both nostrils daily.   furosemide 40 MG tablet Commonly known as:  LASIX Take 1 tablet (40 mg total) by mouth 2 (two) times daily.   glimepiride 2 MG tablet Commonly known as:  AMARYL TAKE ONE TABLET BY MOUTH ONCE DAILY BEFORE  BREAKFAST. What changed:  See the new instructions.   lisinopril 20 MG tablet Commonly known as:  PRINIVIL,ZESTRIL Take 1 tablet (20 mg total) by mouth daily. What changed:  medication strength  See the new instructions.   metFORMIN 500 MG 24 hr tablet Commonly known as:  GLUCOPHAGE-XR 4 tabs mouth in the AM   potassium chloride 10 MEQ CR capsule Commonly known as:  MICRO-K TAKE FOUR CAPSULES BY MOUTH ONCE DAILY   SYSTANE ULTRA OP Apply 1 drop to eye daily as needed (dry eyes).   tiZANidine 4 MG tablet Commonly known as:  ZANAFLEX Take 1 tablet (4  mg total) by mouth every 6 (six) hours as needed for muscle spasms. What changed:  when to take this   traMADol 50 MG tablet Commonly known as:  ULTRAM Take 1-2 tablets (50-100 mg total) by mouth every 4 (four) hours as needed for moderate pain.      Follow-up Information     Sherren Mocha, MD Follow up on 10/17/2016.   Specialty:  Cardiology Why:  Appointment is at 9:40 Contact information: [address] [address] [phone]        [facility] Follow up on 10/17/2016.   Specialty:  Cardiology Why:  Appointment is at 8:30 Contact information: [address] [id number] [place] [phone]          Signed: Ellwood Handler 09/26/2016, 8:05 AM

## 2016-09-26 NOTE — Progress Notes (Signed)
[address]       [place] [address]             [phone]     CARDIOTHORACIC SURGERY PROGRESS NOTE  2 Days Post-Op  S/P Procedure(s) (LRB): TRANSCATHETER AORTIC VALVE REPLACEMENT, TRANSFEMORAL (N/A) TRANSESOPHAGEAL ECHOCARDIOGRAM (TEE) (N/A)  Subjective: Feels well.  Some nausea/vomiting yesterday, but none overnight.  No pain, SOB.  Ambulating w/out difficulty  Objective: Vital signs in last 24 hours: Temp:  [98.2 F (36.8 C)-99.1 F (37.3 C)] 98.2 F (36.8 C) (05/31 0401) Pulse Rate:  [55-65] 65 (05/30 1946) Cardiac Rhythm: Sinus bradycardia;Normal sinus rhythm (05/30 2215) Resp:  [15-23] 17 (05/31 0401) BP: (123-161)/(50-71) 131/63 (05/31 0401) SpO2:  [95 %-100 %] 96 % (05/31 0401) FiO2 (%):  [28 %] 28 % (05/30 0812) Weight:  [229 lb 8 oz (104.1 kg)] 229 lb 8 oz (104.1 kg) (05/31 0401)  Physical Exam:  Rhythm:   sinus  Breath sounds: clear  Heart sounds:  RRR w/out murmur  Incisions:  Both groins look good  Abdomen:  Soft, non-distended, non-tender  Extremities:  Warm, well-perfused    Intake/Output from previous day: 05/30 0701 - 05/31 0700 In: 120 [P.O.:120] Out: 150 [Urine:150] Intake/Output this shift: No intake/output data recorded.  Lab Results:  Recent Labs  09/24/16 1030 09/24/16 1041 09/25/16 0330  WBC 8.4  --  10.1  HGB 11.4* 11.9* 11.4*  HCT 35.0* 35.0* 35.6*  PLT 172  --  164   BMET:  Recent Labs  09/24/16 0946 09/24/16 1041 09/25/16 0330  NA 144 145 141  K 3.0* 3.1* 3.2*  CL 107  --  108  CO2  --   --  27  GLUCOSE 166* 150* 97  BUN 9  --  <5*  CREATININE 0.60  --  0.59  CALCIUM  --   --  8.4*    CBG (last 3)   Recent Labs  09/25/16 1401 09/25/16 2049 09/26/16 0607  GLUCAP 150* 137* 105*   PT/INR:   Recent Labs  09/24/16 1030  LABPROT 13.7  INR 1.05    CXR:  N/A  Transthoracic Echocardiography  Patient:    Kennedie, Pardoe MR #:       [id number] Study Date:  09/25/2016 Gender:     F Age:        71 Height: Weight: BSA: Pt. Status: Room:       [id number]   Andrena Mews, M.D.  SONOGRAPHER  Dustin Flock, RCS  ADMITTING    Sherren Mocha, MD  ATTENDING    Sherren Mocha, MD  PERFORMING   [facility], Inpatient  cc:  ------------------------------------------------------------------- LV EF: 60% -   65%  ------------------------------------------------------------------- Indications:      Aortic stenosis /insufficiency 424.1.  ------------------------------------------------------------------- History:   PMH:  s/p TAVR, Hypertension, hyperlipidemia, aortic stenosis, DM.  ------------------------------------------------------------------- Study Conclusions  - Left ventricle: The cavity size was normal. Systolic function was   normal. The estimated ejection fraction was in the range of 60%   to 65%. Wall motion was normal; there were no regional wall   motion abnormalities. - Aortic valve: A bioprosthesis was present. There was trivial   regurgitation. Valve area (VTI): 1.25 cm^2. Valve area (Vmax):   1.14 cm^2. Valve area (Vmean): 1.32 cm^2. - Mitral valve: Calcified annulus. There was mild regurgitation.  Impressions:  - Limited study s/p TAVR; normal LV systolic function; s/p AVR with   normal mean gradient  of 18 mmHg and trace AI; mild MR.  ------------------------------------------------------------------- Study data:  Comparison was made to the study of 09/24/2016.  Study status:  Routine.  Procedure:  The patient reported no pain pre or post test. Transthoracic echocardiography. Image quality was adequate.          Transthoracic echocardiography.  M-mode, complete 2D, spectral Doppler, and color Doppler.  Birthdate: Patient birthdate: 03-Feb-1946.  Age:  Patient is 71 yr old.  Sex: Gender: female.  Blood pressure:     136/52  Patient status: Inpatient.  Study date:  Study date: 09/25/2016. Study time:  04:04 PM.  Location:  Bedside.  -------------------------------------------------------------------  ------------------------------------------------------------------- Left ventricle:  The cavity size was normal. Systolic function was normal. The estimated ejection fraction was in the range of 60% to 65%. Wall motion was normal; there were no regional wall motion abnormalities.  ------------------------------------------------------------------- Aortic valve:  A bioprosthesis was present.  Doppler: Transvalvular velocity was within the normal range. There was no stenosis. There was trivial regurgitation.    VTI ratio of LVOT to aortic valve: 0.4. Valve area (VTI): 1.25 cm^2. Peak velocity ratio of LVOT to aortic valve: 0.36. Valve area (Vmax): 1.14 cm^2. Mean velocity ratio of LVOT to aortic valve: 0.42. Valve area (Vmean): 1.32 cm^2.    Mean gradient (S): 18 mm Hg. Peak gradient (S): 38 mm Hg.  ------------------------------------------------------------------- Aorta:  Aortic root: The aortic root was normal in size.  ------------------------------------------------------------------- Mitral valve:   Calcified annulus. Mobility was not restricted. Doppler:  There was mild regurgitation.  ------------------------------------------------------------------- Left atrium:  The atrium was normal in size.  ------------------------------------------------------------------- Right ventricle:  The cavity size was normal. Systolic function was normal.  ------------------------------------------------------------------- Tricuspid valve:   Structurally normal valve.  ------------------------------------------------------------------- Right atrium:  The atrium was normal in size.  ------------------------------------------------------------------- Pericardium:  There was no pericardial  effusion.  ------------------------------------------------------------------- Measurements   Left ventricle                       Value  Stroke volume, 2D                    83    ml    LVOT                                 Value  LVOT ID, S                           20    mm  LVOT area                            3.14  cm^2  LVOT peak velocity, S                112   cm/s  LVOT mean velocity, S                80.1  cm/s  LVOT VTI, S                          26.3  cm  LVOT peak gradient, S                5     mm Hg    Aortic valve  Value  Aortic valve peak velocity, S        309   cm/s  Aortic valve mean velocity, S        191   cm/s  Aortic valve VTI, S                  66.2  cm  Aortic mean gradient, S              18    mm Hg  Aortic peak gradient, S              38    mm Hg  VTI ratio, LVOT/AV                   0.4  Aortic valve area, VTI               1.25  cm^2  Velocity ratio, peak, LVOT/AV        0.36  Aortic valve area, peak velocity     1.14  cm^2  Velocity ratio, mean, LVOT/AV        0.42  Aortic valve area, mean velocity     1.32  cm^2  Legend: (L)  and  (H)  mark values outside specified reference range.  ------------------------------------------------------------------- Prepared and Electronically Authenticated by  Kirk Ruths 2018-05-30T17:37:21   Assessment/Plan: S/P Procedure(s) (LRB): TRANSCATHETER AORTIC VALVE REPLACEMENT, TRANSFEMORAL (N/A) TRANSESOPHAGEAL ECHOCARDIOGRAM (TEE) (N/A)  Doing well POD2 TAVR Will resume lisinopril today at reduced dose D/C home today if she tolerates breakfast ASA + Plavix for 6 months Routine f/u 30 days   Rexene Alberts, MD 09/26/2016 6:40 AM

## 2016-09-26 NOTE — Discharge Instructions (Signed)
ACTIVITY AND EXERCISE °• Daily activity and exercise are an important part °of your recovery. People recover at different rates °depending on their general health and type of °valve procedure. °• Most people require six to 10 weeks to feel °recovered. °• No lifting, pushing, pulling more than 10 pounds °(examples to avoid: groceries, vacuuming, °gardening, golfing): °- For one week with a procedure through the groin. °- For six weeks for procedures through the chest °wall. °- For three months for procedures through the °breast-bone. °• After the initial healing process of the access site, °we recommend cardiac rehabilitation for all TAVR °patients. Cardiac rehabilitation will help you: °- Rebuild stamina, strength and balance. °- Learn how to participate in activities safely, as well °as help you regain confidence to do so. °- Return to activities of daily living and leisure. °• Discuss attending cardiac rehabilitation at your °follow-up appointment  ° °DRIVING °• Do not drive for four weeks after the date of your °procedure. °• If you have been told by your doctor in the past °that you may not drive, you must talk with him/her °before you begin driving again. °• When you resume driving, you must have someone °with you. ° °HYGIENE °If you had a femoral (leg) procedure, you may take a shower when you return home. After the shower, pat the °site dry. Do NOT use powder, oils or lotions in your groin area until the site has completely healed. °• If you had a chest procedure, you may shower when you return home unless specifically instructed not to by °your discharging practitioner. °- DO NOT scrub incision; pat dry with a towel °- DO NOT apply any lotions, oils, powders to the incision °- No tub baths / swimming for at least six weeks. ° °SITE CARE °• You likely will have small openings in both groins °from catheters used during the procedure. If you °had a transfemoral procedure, one groin will have a °larger opening  and may be bruised or tender. °• If you had a chest procedure, you will have either a °small incision in your upper sternum (breast-bone) °or between your ribs on your left side. °- Chest wall site: The surgical incision should be °kept dry (no lotions / oils / powders) and open °to air. If you experience irritation from clothing °rubbing on the incision, a light gauze dressing °may be applied. °- Inspect your incision daily; notify your °physician if there is increased redness, swelling °or drainage from the incision. °- If the incision is located on your breast-bone °you must avoid lifting objects heavier than a °gallon of milk (eight pounds) and stretching / °twisting / pulling with your arms for at least °three months to ensure strong bone healing. ° ° °CONTACT [phone]- °• Check your sites daily. Contact our office if you have °any of the following problems: °- Redness and warmth that does not go away °- Yellow or green drainage from the wound °- Fever and chills °- Increasing numbness in your legs °- Worsening pain at the site °• If you had a leg/groin procedure, it is normal to °have bruising or a soft lump at the site. It is not °normal if the lump suddenly becomes larger or °more firm. This may mean you are bleeding. If this °happens: °- Lie down °- Have someone press down hard, just above °the hole in your skin where the procedure was °performed for 15 minutes. If after holding on the °site, the lump does not become larger or harder, °they   are performing this correctly. °- If the bleeding has stopped after 15 minutes, rest °and stay laying down for at least two hours. °- If the bleeding continues, call 911 for an °ambulance. Do NOT drive yourself or have °someone else drive you. °

## 2016-09-26 NOTE — Consult Note (Signed)
           [facility] CM Primary Care Navigator  09/26/2016  Denise [name] Aug 31, 1945 [id number]   Went to see patientat the bedsideto identify possible discharge needs but she was alreadydischarged.  Patient was discharged home today per RN report.  Primary care provider's office called (Lucy)to notify of patient's discharge and need for post hospital follow-up and transition of care.  Made aware to refer patient to [facility] care management ifdeemed appropriatefor services.    For questions, please contact:  Dannielle Huh, BSN, RN- [facility] Primary Care Navigator  Telephone: [phone] [place]

## 2016-09-26 NOTE — Progress Notes (Signed)
CARDIAC REHAB PHASE I   PRE:  Rate/Rhythm: NT  BP:  Sitting: 147/62        SaO2: 96 RA  MODE:  Ambulation: 270 ft   POST:  Rate/Rhythm: NT  BP:  Sitting: 150/73         SaO2: 99 RA  Pt ambulated 270 ft on RA, rolling walker, hand held assist, slow, steady gait, tolerated fairly well. Pt c/o feeling "a little lightheaded," mild DOE (states it is improved), brief standing rest x3. Completed cardiac surgery discharge education. Reviewed restrictions, activity progression, daily weights, exercise guidelines, heart healthy and diabetes diet and phase 2 cardiac rehab. Pt verbalized understanding. Pt agrees to phase 2 cardiac rehab referral, will send to [facility] per pt request. Pt to edge of bed per pt request after walk, call bell within reach.   [place], RN, BSN 09/26/2016 9:13 AM

## 2016-09-26 NOTE — Telephone Encounter (Signed)
Called pt to set-up TCM hosp f/u appt no answer LMOM RTC.../lmb 

## 2016-09-26 NOTE — Care Management Note (Signed)
Case Management Note  Patient Details  Name: Precilla Purnell MRN: [id number] Date of Birth: Feb 18, 1946  Subjective/Objective:                Patient with order to DC to home today. Will follow up with cardiac rehab in [place], referral made by Cardiac Rehab team. No other CM needs identified.     Action/Plan:  DC to home Expected Discharge Date:  09/26/16               Expected Discharge Plan:  Home/Self Care  In-House Referral:     Discharge planning Services  CM Consult  Post Acute Care Choice:    Choice offered to:     DME Arranged:    DME Agency:     HH Arranged:    HH Agency:     Status of Service:  Completed, signed off  If discussed at [employer] of Stay Meetings, dates discussed:    Additional Comments:  Carles Collet, RN 09/26/2016, 10:43 AM

## 2016-09-27 ENCOUNTER — Encounter (HOSPITAL_COMMUNITY): Payer: Self-pay | Admitting: Cardiovascular Disease

## 2016-09-27 ENCOUNTER — Telehealth (HOSPITAL_COMMUNITY): Payer: Self-pay

## 2016-09-27 NOTE — Addendum Note (Signed)
Addendum  created 09/27/16 1504 by Rica Koyanagi, MD   Sign clinical note

## 2016-09-27 NOTE — Telephone Encounter (Signed)
Patient insurances are active and verified. Patient has Medicare [place] Medicare A/B - no co-payment, deductible $183.00/$183.00 has been met, 20% co-insurance, no pre-authorization and no limit on visit. Passport/reference [phone]. [place] - no co-payment, deductible $350/$0 met, out of pocket $5000/$204 has been met, 15% co-insurance, no pre-authorization and no limit on visit. Passport/reference [phone].   Patient will be contacted and scheduled after their follow up appointment with the cardiologist on 10/17/16, upon review by the RN navigator.

## 2016-09-27 NOTE — OR Nursing (Signed)
Late entry due to delay code charting.

## 2016-09-30 NOTE — Telephone Encounter (Signed)
Called pt to f/u on TCM call from 5/31 pt stated she has made appt for tomorrow w/Dr. Jenny Reichmann. Completed TCM call below inform pt had some additional questions to asked.../lmb  Transition Care Management Follow-up Telephone Call  Date discharged? 09/26/2016   How have you been since you were released from the hospital? Pt states she is doing ok   Do you understand why you were in the hospital? YES   Do you understand the discharge instructions? YES   Where were you discharged to? Home   Items Reviewed:  Medications reviewed: YES  Allergies reviewed: YES  Dietary changes reviewed: YES  Referrals reviewed: No referral needed   Functional Questionnaire:   Activities of Daily Living (ADLs):   She states she are independent in the following: ambulation, bathing and hygiene, feeding, continence, grooming, toileting and dressing States she require assistance with the following: ambulation   Any transportation issues/concerns?: NO   Any patient concerns? YES   Confirmed importance and date/time of follow-up visits scheduled YES, appt 10/01/16  Provider Appointment booked with Dr. Jenny Reichmann  Confirmed with patient if condition begins to worsen call PCP or go to the ER.  Patient was given the office number and encouraged to call back with question or concerns.  : YES

## 2016-10-01 ENCOUNTER — Inpatient Hospital Stay: Payer: Self-pay | Admitting: Internal Medicine

## 2016-10-02 ENCOUNTER — Ambulatory Visit (INDEPENDENT_AMBULATORY_CARE_PROVIDER_SITE_OTHER): Payer: Medicare Other | Admitting: Internal Medicine

## 2016-10-02 ENCOUNTER — Encounter: Payer: Self-pay | Admitting: Internal Medicine

## 2016-10-02 VITALS — BP 130/88 | HR 63 | Ht 61.0 in | Wt 224.0 lb

## 2016-10-02 DIAGNOSIS — E119 Type 2 diabetes mellitus without complications: Secondary | ICD-10-CM | POA: Diagnosis not present

## 2016-10-02 DIAGNOSIS — I10 Essential (primary) hypertension: Secondary | ICD-10-CM | POA: Diagnosis not present

## 2016-10-02 DIAGNOSIS — Z952 Presence of prosthetic heart valve: Secondary | ICD-10-CM

## 2016-10-02 MED ORDER — GLIMEPIRIDE 2 MG PO TABS: 1.0000 mg | ORAL_TABLET | Freq: Every day | ORAL | 3 refills | Status: DC

## 2016-10-02 NOTE — Patient Instructions (Addendum)
OK to decrease the glimeparide to HALF pill per day  Please continue all other medications as before, and refills have been done if requested.  Please have the pharmacy call with any other refills you may need.  Please continue your efforts at being more active, low cholesterol diabetic diet, and weight control.  Please keep your appointments with your specialists as you may have planned  Please return in 6 months, or sooner if needed
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
## 2016-10-04 NOTE — Assessment & Plan Note (Addendum)
stable overall by history and exam, recent data reviewed with pt, and pt to continue medical treatment as before except although no low sugars it is appropriate to reduce the glimeparide to 1 mg daily for safety,  to f/u any worsening symptoms or concerns Lab Results  Component Value Date   HGBA1C 5.7 (H) 09/20/2016

## 2016-10-04 NOTE — Assessment & Plan Note (Signed)
Overall improved and stable, to f/u any worsening symptoms or concerns

## 2016-10-04 NOTE — Assessment & Plan Note (Signed)
stable overall by history and exam, recent data reviewed with pt, and pt to continue medical treatment as before,  to f/u any worsening symptoms or concerns BP Readings from Last 3 Encounters:  10/02/16 130/88  09/26/16 136/60  09/20/16 128/66

## 2016-10-08 ENCOUNTER — Encounter: Payer: Self-pay | Admitting: Cardiovascular Disease

## 2016-10-17 ENCOUNTER — Other Ambulatory Visit: Payer: Self-pay

## 2016-10-17 ENCOUNTER — Ambulatory Visit (INDEPENDENT_AMBULATORY_CARE_PROVIDER_SITE_OTHER): Payer: Medicare Other | Admitting: Cardiovascular Disease

## 2016-10-17 ENCOUNTER — Ambulatory Visit (HOSPITAL_COMMUNITY): Payer: Medicare Other | Attending: Cardiology

## 2016-10-17 ENCOUNTER — Encounter: Payer: Self-pay | Admitting: Cardiovascular Disease

## 2016-10-17 VITALS — BP 140/80 | HR 60 | Ht 62.0 in | Wt 224.8 lb

## 2016-10-17 DIAGNOSIS — Z952 Presence of prosthetic heart valve: Secondary | ICD-10-CM

## 2016-10-17 DIAGNOSIS — Z87891 Personal history of nicotine dependence: Secondary | ICD-10-CM | POA: Insufficient documentation

## 2016-10-17 DIAGNOSIS — I119 Hypertensive heart disease without heart failure: Secondary | ICD-10-CM | POA: Diagnosis not present

## 2016-10-17 DIAGNOSIS — Z6841 Body Mass Index (BMI) 40.0 and over, adult: Secondary | ICD-10-CM | POA: Diagnosis not present

## 2016-10-17 DIAGNOSIS — I350 Nonrheumatic aortic (valve) stenosis: Secondary | ICD-10-CM

## 2016-10-17 DIAGNOSIS — E669 Obesity, unspecified: Secondary | ICD-10-CM | POA: Insufficient documentation

## 2016-10-17 DIAGNOSIS — Z953 Presence of xenogenic heart valve: Secondary | ICD-10-CM | POA: Insufficient documentation

## 2016-10-17 DIAGNOSIS — E785 Hyperlipidemia, unspecified: Secondary | ICD-10-CM | POA: Diagnosis not present

## 2016-10-17 DIAGNOSIS — E119 Type 2 diabetes mellitus without complications: Secondary | ICD-10-CM | POA: Diagnosis not present

## 2016-10-17 LAB — ECHOCARDIOGRAM COMPLETE
AOVTI: 68.9 cm
AV Mean grad: 24 mmHg
AV pk vel: 339 cm/s
AVCELMEANRAT: 0.39
AVPG: 46 mmHg
Ao pk vel: 0.4 m/s
DOP CAL AO MEAN VELOCITY: 227 cm/s
E decel time: 250 msec
EERAT: 16.56
FS: 38 % (ref 28–44)
IVS/LV PW RATIO, ED: 1
LA vol index: 27.7 mL/m2
LA vol: 59.5 mL
LADIAMINDEX: 2.05 cm/m2
LASIZE: 44 mm
LAVOLA4C: 62.8 mL
LEFT ATRIUM END SYS DIAM: 44 mm
LV E/e' medial: 16.56
LV E/e'average: 16.56
LV PW d: 11 mm — AB (ref 0.6–1.1)
LV TDI E'MEDIAL: 4.5
LV e' LATERAL: 4.8 cm/s
LVOT VTI: 31.2 cm
LVOT peak grad rest: 7 mmHg
LVOT peak vel: 135 cm/s
LVOTVTI: 0.45 cm
MV Dec: 250
MV pk A vel: 114 m/s
MVAP: 3.01 cm2
MVPG: 3 mmHg
MVPKEVEL: 79.5 m/s
P 1/2 time: 73 ms
RV LATERAL S' VELOCITY: 11.4 cm/s
RV TAPSE: 26.7 mm
TDI e' lateral: 4.8
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
## 2016-10-17 NOTE — Patient Instructions (Signed)
Medication Instructions:  Your physician recommends that you continue on your current medications as directed. Please refer to the Current Medication list given to you today.  Labwork: No new orders.   Testing/Procedures: Your physician has requested that you have GATED cardiac CT. Cardiac computed tomography (CT) is a painless test that uses an x-ray machine to take clear, detailed pictures of your heart. For further information please visit [email or web address]. Please follow instruction sheet as given.  Follow-Up: You have been referred to Cardiac Rehabilitation at [facility].  Your physician recommends that you schedule a follow-up appointment in: 3 MONTHS with Dr Oval Linsey   Any Other Special Instructions Will Be Listed Below (If Applicable).     If you need a refill on your cardiac medications before your next appointment, please call your pharmacy.

## 2016-10-18 ENCOUNTER — Telehealth (HOSPITAL_COMMUNITY): Payer: Self-pay

## 2016-10-18 NOTE — Telephone Encounter (Signed)
I called and left message on patient voicemail to call office about scheduling for cardiac rehab. I left office contact information on patient voicemail to return call.  ° °

## 2016-10-22 ENCOUNTER — Telehealth: Payer: Self-pay

## 2016-10-22 ENCOUNTER — Ambulatory Visit (HOSPITAL_COMMUNITY): Payer: Self-pay | Admitting: Dentistry

## 2016-10-22 ENCOUNTER — Encounter: Payer: Self-pay | Admitting: Cardiovascular Disease

## 2016-10-22 MED ORDER — AMOXICILLIN 500 MG PO CAPS: ORAL_CAPSULE | ORAL | 1 refills | Status: DC

## 2016-10-22 NOTE — Telephone Encounter (Signed)
-----   Message from Sherren Mocha, MD sent at 10/22/2016  1:11 PM EDT ----- Please call in Amoxil 500 mg, take 4, one hour prior to dental work as needed.   thanks

## 2016-10-22 NOTE — Telephone Encounter (Signed)
Sent medication to patients pharmacy.  

## 2016-10-29 ENCOUNTER — Telehealth (HOSPITAL_COMMUNITY): Payer: Self-pay

## 2016-10-29 NOTE — Telephone Encounter (Signed)
*  Updated insurance* Medicare A/B - no co-payment, deductible $183.00/$183.00 has been met, 20% co-insurance, no pre-authorization and no limit on visit. Passport/reference [id number]. [place] - no co-payment, deductible $350/$0 has been met, out of pocket $5000/$297.81 has been met, 15% co-insurance, no pre-authorization and no limit on visit. Passport/reference#[id number].

## 2016-10-31 ENCOUNTER — Encounter (HOSPITAL_COMMUNITY)
Admission: RE | Admit: 2016-10-31 | Discharge: 2016-10-31 | Disposition: A | Discharge status: Home / Self Care | Payer: Medicare Other | Source: Ambulatory Visit | Attending: Cardiovascular Disease | Admitting: Cardiovascular Disease

## 2016-10-31 ENCOUNTER — Encounter (HOSPITAL_COMMUNITY): Payer: Self-pay

## 2016-10-31 VITALS — BP 138/84 | HR 70 | Ht 61.0 in | Wt 222.2 lb

## 2016-10-31 DIAGNOSIS — Z952 Presence of prosthetic heart valve: Secondary | ICD-10-CM | POA: Insufficient documentation

## 2016-10-31 DIAGNOSIS — Z87891 Personal history of nicotine dependence: Secondary | ICD-10-CM | POA: Diagnosis not present

## 2016-10-31 DIAGNOSIS — Z7902 Long term (current) use of antithrombotics/antiplatelets: Secondary | ICD-10-CM | POA: Diagnosis not present

## 2016-10-31 DIAGNOSIS — Z7984 Long term (current) use of oral hypoglycemic drugs: Secondary | ICD-10-CM | POA: Diagnosis not present

## 2016-10-31 DIAGNOSIS — Z7951 Long term (current) use of inhaled steroids: Secondary | ICD-10-CM | POA: Insufficient documentation

## 2016-10-31 DIAGNOSIS — Z7982 Long term (current) use of aspirin: Secondary | ICD-10-CM | POA: Insufficient documentation

## 2016-10-31 DIAGNOSIS — J45909 Unspecified asthma, uncomplicated: Secondary | ICD-10-CM | POA: Diagnosis not present

## 2016-10-31 DIAGNOSIS — E669 Obesity, unspecified: Secondary | ICD-10-CM | POA: Diagnosis not present

## 2016-10-31 DIAGNOSIS — E119 Type 2 diabetes mellitus without complications: Secondary | ICD-10-CM | POA: Diagnosis not present

## 2016-10-31 DIAGNOSIS — M4800 Spinal stenosis, site unspecified: Secondary | ICD-10-CM | POA: Diagnosis not present

## 2016-10-31 DIAGNOSIS — M170 Bilateral primary osteoarthritis of knee: Secondary | ICD-10-CM | POA: Diagnosis not present

## 2016-10-31 DIAGNOSIS — I10 Essential (primary) hypertension: Secondary | ICD-10-CM | POA: Insufficient documentation

## 2016-10-31 DIAGNOSIS — Z853 Personal history of malignant neoplasm of breast: Secondary | ICD-10-CM | POA: Diagnosis not present

## 2016-10-31 DIAGNOSIS — Z79899 Other long term (current) drug therapy: Secondary | ICD-10-CM | POA: Insufficient documentation

## 2016-10-31 DIAGNOSIS — E7800 Pure hypercholesterolemia, unspecified: Secondary | ICD-10-CM | POA: Insufficient documentation

## 2016-10-31 DIAGNOSIS — Z6841 Body Mass Index (BMI) 40.0 and over, adult: Secondary | ICD-10-CM | POA: Diagnosis not present

## 2016-10-31 NOTE — Progress Notes (Signed)
Cardiac Rehab Medication Review by a Pharmacist  Does the patient  feel that his/her medications are working for him/her?  yes  Has the patient been experiencing any side effects to the medications prescribed?  Yes - bruising  Does the patient measure his/her own blood pressure or blood glucose at home?  yes   Does the patient have any problems obtaining medications due to transportation or finances?   no  Understanding of regimen: excellent Understanding of indications: good Potential of compliance: excellent    Pharmacist comments: Ms. Juran is a pleasant 71 yo female presenting in good spirits, ambulating with a cane. Reviewed all medications with patient. Patient denies s/sx of hypoglycemia, hypotension, bradycardia outside of occasional lightheadedness. Patient states that she has been "unsteady" for several years and therefore uses a cane.  Counseled to avoid NSAIDs with ACE-I and to take APAP instead. Patient denies any questions or concerns.    Carlean Jews, Pharm.D. 7/5/20188:08 AM

## 2016-10-31 NOTE — Progress Notes (Signed)
Cardiac Individual Treatment Plan  Patient Details  Name: Denise [name] MRN: [id number] Date of Birth: 1946-04-22 Referring Provider:     CARDIAC REHAB PHASE II ORIENTATION from 10/31/2016 in [place]  Referring Provider  Sherren Mocha MD      Initial Encounter Date:    CARDIAC REHAB PHASE II ORIENTATION from 10/31/2016 in [facility]  Date  10/31/16  Referring Provider  Sherren Mocha MD      Visit Diagnosis: 09/24/16 S/P TAVR (transcatheter aortic valve replacement)  Patient's Home Medications on Admission:  Current Outpatient Prescriptions:  .  albuterol (PROVENTIL HFA;VENTOLIN HFA) 108 (90 Base) MCG/ACT inhaler, Inhale 2 puffs into the lungs every 6 (six) hours as needed for wheezing or shortness of breath., Disp: 1 Inhaler, Rfl: 11 .  amLODipine (NORVASC) 10 MG tablet, Take 1 tablet (10 mg total) by mouth daily., Disp: 90 tablet, Rfl: 3 .  amoxicillin (AMOXIL) 500 MG capsule, Take 4 tablets by mouth one hour prior to dental work as needed., Disp: 4 capsule, Rfl: 1 .  anastrozole (ARIMIDEX) 1 MG tablet, Take 1 tablet (1 mg total) by mouth daily., Disp: 90 tablet, Rfl: 3 .  aspirin 81 MG EC tablet, Take 81 mg by mouth daily.  , Disp: , Rfl:  .  atorvastatin (LIPITOR) 40 MG tablet, TAKE ONE TABLET BY MOUTH ONCE DAILY., Disp: 90 tablet, Rfl: 3 .  budesonide-formoterol (SYMBICORT) 160-4.5 MCG/ACT inhaler, Inhale 2 puffs into the lungs 2 (two) times daily., Disp: 3 Inhaler, Rfl: 3 .  Calcium-Magnesium-Vitamin D (CALCIUM 1200+D3 PO), Take 1 tablet by mouth daily., Disp: , Rfl:  .  cetirizine (ZYRTEC) 10 MG tablet, TAKE ONE TABLET BY MOUTH ONCE DAILY AS NEEDED FOR ALLERGIES, Disp: 90 tablet, Rfl: 0 .  clopidogrel (PLAVIX) 75 MG tablet, Take 1 tablet (75 mg total) by mouth daily with breakfast., Disp: 30 tablet, Rfl: 3 .  fluticasone (FLONASE) 50 MCG/ACT nasal spray, Place 2 sprays into both nostrils daily., Disp: 16  g, Rfl: 5 .  furosemide (LASIX) 40 MG tablet, Take 1 tablet (40 mg total) by mouth 2 (two) times daily., Disp: 180 tablet, Rfl: 1 .  glimepiride (AMARYL) 2 MG tablet, Take 0.5 tablets (1 mg total) by mouth daily with breakfast., Disp: 90 tablet, Rfl: 3 .  lisinopril (PRINIVIL,ZESTRIL) 20 MG tablet, Take 1 tablet (20 mg total) by mouth daily., Disp: 30 tablet, Rfl: 3 .  metFORMIN (GLUCOPHAGE-XR) 500 MG 24 hr tablet, 4 tabs mouth in the AM, Disp: 360 tablet, Rfl: 3 .  Multiple Vitamins-Minerals (ALIVE WOMENS 50+) TABS, Take 1 tablet by mouth daily., Disp: , Rfl:  .  Omega-3 Fatty Acids (FISH OIL) 1000 MG CAPS, Take 1,000 mg by mouth daily., Disp: , Rfl:  .  Polyethyl Glycol-Propyl Glycol (SYSTANE ULTRA OP), Apply 1 drop to eye daily as needed (dry eyes)., Disp: , Rfl:  .  potassium chloride (MICRO-K) 10 MEQ CR capsule, TAKE FOUR CAPSULES BY MOUTH ONCE DAILY, Disp: 360 capsule, Rfl: 3 .  tiZANidine (ZANAFLEX) 4 MG tablet, Take 4 mg by mouth 2 (two) times daily as needed for muscle spasms., Disp: , Rfl:  .  traMADol (ULTRAM) 50 MG tablet, Take 1-2 tablets (50-100 mg total) by mouth every 4 (four) hours as needed for moderate pain. (Patient not taking: Reported on 10/31/2016), Disp: 30 tablet, Rfl: 0  Past Medical History: Past Medical History:  Diagnosis Date  . Abdominal pain, left lower quadrant 09/12/2008  . ALLERGIC  RHINITIS 08/24/2007  . ANXIETY 08/24/2007  . Aortic stenosis, severe 07/19/2016  . ASTHMA 08/24/2007  . ASTHMA, WITH ACUTE EXACERBATION 03/14/2008  . Breast cancer ([place])   . DDD (degenerative disc disease), lumbar   . DEGENERATIVE JOINT DISEASE 08/24/2007  . DEPRESSION 08/24/2007  . DIABETES MELLITUS, TYPE II 08/24/2007  . ECZEMA 08/24/2007  . Edema 08/24/2007  . GERD 08/24/2007   not current (07/2014)  . Heart murmur   . HYPERCHOLESTEROLEMIA 08/24/2007  . HYPERLIPIDEMIA 08/24/2007  . HYPERTENSION 08/24/2007  . OBESITY 08/24/2007  . 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  POSTMENOPAUSAL STATUS 08/24/2007  . 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 SPINAL STENOSIS 08/24/2007    Tobacco Use: History  Smoking Status  . Former Smoker  Smokeless Tobacco  . Never Used    Comment: quit in 1070    Labs: Recent Review Flowsheet Data    Labs for ITP Cardiac and Pulmonary Rehab Latest Ref Rng & Units 09/24/2016 09/24/2016 09/24/2016 09/24/2016 09/24/2016   Cholestrol 0 - 200 mg/dL - - - - -   LDLCALC 0 - 99 mg/dL - - - - -   HDL >39.00 mg/dL - - - - -   Trlycerides 0.0 - 149.0 mg/dL - - - - -   Hemoglobin A1c 4.8 - 5.6 % - - - - -   PHART 7.350 - 7.450 7.529(H) - - 7.411 7.399   PCO2ART 32.0 - 48.0 mmHg 34.9 - - 39.0 41.3   HCO3 20.0 - 28.0 mmol/L 29.1(H) - - 24.8 25.7   TCO2 0 - 100 mmol/L 30 25 26 26 27    ACIDBASEDEF 0.0 - 2.0 mmol/L - - - - -   O2SAT % 100.0 - - 99.0 99.0      Capillary Blood Glucose: Lab Results  Component Value Date   GLUCAP 115 (H) 09/26/2016   GLUCAP 105 (H) 09/26/2016   GLUCAP 137 (H) 09/25/2016   GLUCAP 150 (H) 09/25/2016   GLUCAP 118 (H) 09/25/2016     Exercise Target Goals: Date: 10/31/16  Exercise Program Goal: Individual exercise prescription set with THRR, safety & activity barriers. Participant demonstrates ability to understand and report RPE using BORG scale, to self-measure pulse accurately, and to acknowledge the importance of the exercise prescription.  Exercise Prescription Goal: Starting with aerobic activity 30 plus minutes a day, 3 days per week for initial exercise prescription. Provide home exercise prescription and guidelines that participant acknowledges understanding prior to discharge.  Activity Barriers & Risk Stratification:     Activity Barriers & Cardiac Risk Stratification - 10/31/16 0742      Activity Barriers & Cardiac Risk Stratification    Activity Barriers Assistive Device   Cardiac Risk Stratification High      6 Minute Walk:     6 Minute Walk    Row Name 10/31/16 1013         6 Minute Walk   Phase Initial     Distance 2 feet     Walk Time 6 minutes     # of Rest Breaks 0     MPH 2     METS 2.2     RPE 13     VO2 Peak 7.7     Symptoms No     Resting HR 70 bpm     Resting BP 138/84  Max Ex. HR 116 bpm     Max Ex. BP 178/90     2 Minute Post BP 118/60        Oxygen Initial Assessment:   Oxygen Re-Evaluation:   Oxygen Discharge (Final Oxygen Re-Evaluation):   Initial Exercise Prescription:     Initial Exercise Prescription - 10/31/16 1000      Date of Initial Exercise RX and Referring Provider   Date 10/31/16   Referring Provider Sherren Mocha MD     Recumbant Bike   Level 1   Minutes 10   METs 2.2     NuStep   Level 1   SPM 80   Minutes 10   METs 2     Track   Laps 8   Minutes 10   METs 2.4     Prescription Details   Frequency (times per week) 3   Duration Progress to 45 minutes of aerobic exercise without signs/symptoms of physical distress     Intensity   THRR 40-80% of Max Heartrate 60-119   Ratings of Perceived Exertion 11-13   Perceived Dyspnea 0-4     Progression   Progression Continue to progress workloads to maintain intensity without signs/symptoms of physical distress.     Resistance Training   Training Prescription Yes   Weight 2   Reps 10-15      Perform Capillary Blood Glucose checks as needed.  Exercise Prescription Changes:   Exercise Comments:   Exercise Goals and Review:     Exercise Goals    Row Name 10/31/16 0743 10/31/16 1016           Exercise Goals   Increase Physical Activity (P)  Yes Yes      Intervention (P)  Provide advice, education, support and counseling about physical activity/exercise needs.;Develop an individualized exercise prescription for aerobic and resistive training based on initial evaluation findings, risk  stratification, comorbidities and participant's personal goals. Provide advice, education, support and counseling about physical activity/exercise needs.;Develop an individualized exercise prescription for aerobic and resistive training based on initial evaluation findings, risk stratification, comorbidities and participant's personal goals.      Expected Outcomes (P)  Achievement of increased cardiorespiratory fitness and enhanced flexibility, muscular endurance and strength shown through measurements of functional capacity and personal statement of participant. Achievement of increased cardiorespiratory fitness and enhanced flexibility, muscular endurance and strength shown through measurements of functional capacity and personal statement of participant.      Increase Strength and Stamina (P)  Yes Yes      Intervention (P)  Provide advice, education, support and counseling about physical activity/exercise needs.;Develop an individualized exercise prescription for aerobic and resistive training based on initial evaluation findings, risk stratification, comorbidities and participant's personal goals. Provide advice, education, support and counseling about physical activity/exercise needs.;Develop an individualized exercise prescription for aerobic and resistive training based on initial evaluation findings, risk stratification, comorbidities and participant's personal goals.      Expected Outcomes (P)  Achievement of increased cardiorespiratory fitness and enhanced flexibility, muscular endurance and strength shown through measurements of functional capacity and personal statement of participant. Achievement of increased cardiorespiratory fitness and enhanced flexibility, muscular endurance and strength shown through measurements of functional capacity and personal statement of participant.         Exercise Goals Re-Evaluation :    Discharge Exercise Prescription (Final Exercise Prescription  Changes):   Nutrition:  Target Goals: Understanding of nutrition guidelines, daily intake of sodium 1500mg , cholesterol 200mg , calories 30% from fat  and 7% or less from saturated fats, daily to have 5 or more servings of fruits and vegetables.  Biometrics:     Pre Biometrics - 10/31/16 1017      Pre Biometrics   Waist Circumference 41.5 inches   Hip Circumference 50 inches   Waist to Hip Ratio 0.83 %   Triceps Skinfold 41 mm   % Body Fat 52.1 %   Grip Strength 30 kg   Flexibility 9 in   Single Leg Stand 3.2 seconds       Nutrition Therapy Plan and Nutrition Goals:   Nutrition Discharge: Nutrition Scores:   Nutrition Goals Re-Evaluation:   Nutrition Goals Re-Evaluation:   Nutrition Goals Discharge (Final Nutrition Goals Re-Evaluation):   Psychosocial: Target Goals: Acknowledge presence or absence of significant depression and/or stress, maximize coping skills, provide positive support system. Participant is able to verbalize types and ability to use techniques and skills needed for reducing stress and depression.  Initial Review & Psychosocial Screening:     Initial Psych Review & Screening - 10/31/16 1251      Initial Review   Current issues with None Identified     Family Dynamics   Good Support System? Yes     Barriers   Psychosocial barriers to participate in program There are no identifiable barriers or psychosocial needs.      Quality of Life Scores:     Quality of Life - 10/31/16 1021      Quality of Life Scores   Health/Function Pre 25.08 %   Socioeconomic Pre 27.07 %   Psych/Spiritual Pre 28.93 %   Family Pre 24 %   GLOBAL Pre 26.26 %      PHQ-9: Recent Review Flowsheet Data    Depression screen [facility] 2/9 06/26/2016 02/28/2016 12/27/2015 08/23/2015 02/15/2015   Decreased Interest 0 0 0 0 0   Down, Depressed, Hopeless 0 0 0 0 0   PHQ - 2 Score 0 0 0 0 0     Interpretation of Total Score  Total Score Depression Severity:  1-4 =  Minimal depression, 5-9 = Mild depression, 10-14 = Moderate depression, 15-19 = Moderately severe depression, 20-27 = Severe depression   Psychosocial Evaluation and Intervention:   Psychosocial Re-Evaluation:   Psychosocial Discharge (Final Psychosocial Re-Evaluation):   Vocational Rehabilitation: Provide vocational rehab assistance to qualifying candidates.   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 Participant will state understanding/return demonstration of topics presented.  Learning Barriers/Preferences:     Learning Barriers/Preferences - 10/31/16 0741      Learning Barriers/Preferences   Learning Preferences Skilled Demonstration;Written Material;Verbal Instruction      Education Topics: Count Your Pulse:  -Group instruction provided by verbal instruction, demonstration, patient participation and written materials to support subject.  Instructors address importance of being able to find your pulse and how to count your pulse when at home without a heart monitor.  Patients get hands on experience counting their pulse with staff help and individually.   Heart Attack, Angina, and Risk Factor Modification:  -Group instruction provided by verbal instruction, video, and written materials to support subject.  Instructors address signs and symptoms of angina and heart  attacks.    Also discuss risk factors for heart disease and how to make changes to improve heart health risk factors.   Functional Fitness:  -Group instruction provided by verbal instruction, demonstration, patient participation, and written materials to support subject.  Instructors address  safety measures for doing things around the house.  Discuss how to get up and down off the floor, how to pick things up properly, how to safely get out of a chair without assistance, and balance training.   Meditation and Mindfulness:  -Group instruction provided by verbal instruction, patient participation, and written materials to support subject.  Instructor addresses importance of mindfulness and meditation practice to help reduce stress and improve awareness.  Instructor also leads participants through a meditation exercise.    Stretching for Flexibility and Mobility:  -Group instruction provided by verbal instruction, patient participation, and written materials to support subject.  Instructors lead participants through series of stretches that are designed to increase flexibility thus improving mobility.  These stretches are additional exercise for major muscle groups that are typically performed during regular warm up and cool down.   Hands Only CPR:  -Group verbal, video, and participation provides a basic overview of AHA guidelines for community CPR. Role-play of emergencies allow participants the opportunity to practice calling for help and chest compression technique with discussion of AED use.   Hypertension: -Group verbal and written instruction that provides a basic overview of hypertension including the most recent diagnostic guidelines, risk factor reduction with self-care instructions and medication management.    Nutrition I class: Heart Healthy Eating:  -Group instruction provided by PowerPoint slides, verbal discussion, and written materials to support subject matter. The instructor gives an explanation and review of the Therapeutic Lifestyle Changes diet recommendations, which includes a discussion on lipid goals, dietary fat, sodium, fiber, plant stanol/sterol esters, sugar, and the components of a well-balanced, healthy diet.   Nutrition II class: Lifestyle Skills:   -Group instruction provided by PowerPoint slides, verbal discussion, and written materials to support subject matter. The instructor gives an explanation and review of label reading, grocery shopping for heart health, heart healthy recipe modifications, and ways to make healthier choices when eating out.   Diabetes Question & Answer:  -Group instruction provided by PowerPoint slides, verbal discussion, and written materials to support subject matter. The instructor gives an explanation and review of diabetes co-morbidities, pre- and post-prandial blood glucose goals, pre-exercise blood glucose goals, signs, symptoms, and treatment of hypoglycemia and hyperglycemia, and foot care basics.   Diabetes Blitz:  -Group instruction provided by PowerPoint slides, verbal discussion, and written materials to support subject matter. The instructor gives an explanation and review of the physiology behind type 1 and type 2 diabetes, diabetes medications and rational behind using different medications, pre- and post-prandial blood glucose recommendations and Hemoglobin A1c goals, diabetes diet, and exercise including blood glucose guidelines for exercising safely.    Portion Distortion:  -Group instruction provided by PowerPoint slides, verbal discussion, written materials, and food models to support subject matter. The instructor gives an explanation of serving size versus portion size, changes in portions sizes over the last 20 years, and what consists of a serving from each food group.   Stress Management:  -Group instruction provided by verbal instruction, video, and written materials to support subject matter.  Instructors review role of stress in heart disease and how to cope with stress positively.     Exercising on Your Own:  -Group instruction provided by verbal instruction, power  point, and written materials to support subject.  Instructors discuss benefits of exercise, components of exercise,  frequency and intensity of exercise, and end points for exercise.  Also discuss use of nitroglycerin and activating EMS.  Review options of places to exercise outside of rehab.  Review guidelines for sex with heart disease.   Cardiac Drugs I:  -Group instruction provided by verbal instruction and written materials to support subject.  Instructor reviews cardiac drug classes: antiplatelets, anticoagulants, beta blockers, and statins.  Instructor discusses reasons, side effects, and lifestyle considerations for each drug class.   Cardiac Drugs II:  -Group instruction provided by verbal instruction and written materials to support subject.  Instructor reviews cardiac drug classes: angiotensin converting enzyme inhibitors (ACE-I), angiotensin II receptor blockers (ARBs), nitrates, and calcium channel blockers.  Instructor discusses reasons, side effects, and lifestyle considerations for each drug class.   Anatomy and Physiology of the Circulatory System:  Group verbal and written instruction and models provide basic cardiac anatomy and physiology, with the coronary electrical and arterial systems. Review of: AMI, Angina, Valve disease, Heart Failure, Peripheral Artery Disease, Cardiac Arrhythmia, Pacemakers, and the ICD.   Other Education:  -Group or individual verbal, written, or video instructions that support the educational goals of the cardiac rehab program.   Knowledge Questionnaire Score:     Knowledge Questionnaire Score - 10/31/16 0736      Knowledge Questionnaire Score   Pre Score 22/24      Core Components/Risk Factors/Patient Goals at Admission:     Personal Goals and Risk Factors at Admission - 10/31/16 1142      Core Components/Risk Factors/Patient Goals on Admission   Diabetes Yes      Core Components/Risk Factors/Patient Goals Review:    Core Components/Risk Factors/Patient Goals at Discharge (Final Review):    ITP Comments:     ITP Comments    Row Name  10/31/16 0735           ITP Comments Dr. Fransico Him, Medical Director          Comments:  Patient attended orientation from 0800 to 1000 to review rules and guidelines for program. Completed 6 minute walk test, Intitial ITP, and exercise prescription.  VSS. Telemetry- SR with no ectopy.  Asymptomatic. Pt used cane for assistance and tolerated well with some fatigue noted at the end. Brief Psychosocial Assessment reveals no immediate barriers to participating in CR.  Pt is looking forward to participating in CR next week. Cherre Huger, BSN Cardiac and Training and development officer

## 2016-10-31 NOTE — Progress Notes (Addendum)
Denise [name] 71 y.o. female       Nutrition Screen & Note  1. 09/24/16 S/P TAVR (transcatheter aortic valve replacement)    Past Medical History:  Diagnosis Date  . Abdominal pain, left lower quadrant 09/12/2008  . ALLERGIC RHINITIS 08/24/2007  . ANXIETY 08/24/2007  . Aortic stenosis, severe 07/19/2016  . ASTHMA 08/24/2007  . ASTHMA, WITH ACUTE EXACERBATION 03/14/2008  . Breast cancer ([place])   . DDD (degenerative disc disease), lumbar   . DEGENERATIVE JOINT DISEASE 08/24/2007  . DEPRESSION 08/24/2007  . DIABETES MELLITUS, TYPE II 08/24/2007  . ECZEMA 08/24/2007  . Edema 08/24/2007  . GERD 08/24/2007   not current (07/2014)  . Heart murmur   . HYPERCHOLESTEROLEMIA 08/24/2007  . HYPERLIPIDEMIA 08/24/2007  . HYPERTENSION 08/24/2007  . OBESITY 08/24/2007  . 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 POSTMENOPAUSAL STATUS 08/24/2007  . 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 SPINAL STENOSIS 08/24/2007   Meds reviewed. Metformin, Glimepiride noted  HT: Ht Readings from Last 1 Encounters:  10/31/16 5\' 1"  (1.549 m)    WT: Wt Readings from Last 3 Encounters:  10/31/16 222 lb 3.6 oz (100.8 kg)  10/17/16 224 lb 12.8 oz (102 kg)  10/02/16 224 lb (101.6 kg)     BMI 42.1   Current tobacco use? No  Labs:  Lipid Panel     Component Value Date/Time   CHOL 148 06/26/2016 1112   TRIG 85.0 06/26/2016 1112   HDL 57.10 06/26/2016 1112   CHOLHDL 3 06/26/2016 1112   VLDL 17.0 06/26/2016 1112   LDLCALC 74 06/26/2016 1112    Lab Results  Component Value Date   HGBA1C 5.7 (H) 09/20/2016   CBG (last 3)  No results for input(s): GLUCAP in the last 72 hours.  Nutrition Note Spoke with pt. Nutrition plan and goals reviewed with pt. Pt wants to lose wt. Pt has been trying to lose wt by following [employer] and [employer]. Wt  loss tips reviewed.  Pt is diabetic. Last A1c indicates blood glucose well-controlled. Pt checks CBG's once daily. Fasting CBG's reportedly 80-100 mg/dL. Pt c/o difficulty chewing due to several teeth removed before surgery. Pt has an under bite that "everyone wants to break my jaw." Dental clinics in the area discussed.  Pt expressed understanding of the information reviewed. Pt aware of nutrition education classes offered and is unable to attend nutrition classes.  Nutrition Diagnosis ? Food-and nutrition-related knowledge deficit related to lack of exposure to information as related to diagnosis of: ? CVD ? DM ? Obesity related to excessive energy intake as evidenced by a BMI of 42.1  Nutrition Intervention ? Pt's individual nutrition plan reviewed with pt. ? Pt given handout for [facility]'s partners and resources for free or reduced-fee care. ? Pt given handouts for: ? Nutrition I class ? Nutrition II class ? Diabetes Blitz Class ? Continue client-centered nutrition education by RD, as part of interdisciplinary care.  Nutrition Goal(s):  ? Pt to identify food quantities necessary to achieve weight loss of 6-24 lb (2.7-10.9 kg) at graduation from cardiac rehab. Goal wt of 180 lb desired.   Plan:  Will provide client-centered nutrition education as part of interdisciplinary care.   Monitor and evaluate progress toward nutrition goal with team.  Derek Mound, M.Ed, RD, LDN, CDE 10/31/2016 3:00 PM

## 2016-11-04 ENCOUNTER — Encounter (HOSPITAL_COMMUNITY): Payer: Medicare Other

## 2016-11-04 ENCOUNTER — Encounter: Payer: Self-pay | Admitting: Cardiovascular Disease

## 2016-11-05 ENCOUNTER — Ambulatory Visit (HOSPITAL_COMMUNITY): Payer: Self-pay | Admitting: Dentistry

## 2016-11-05 ENCOUNTER — Encounter (HOSPITAL_COMMUNITY): Payer: Self-pay | Admitting: Dentistry

## 2016-11-05 VITALS — BP 141/62 | HR 68 | Temp 98.2°F

## 2016-11-05 DIAGNOSIS — K029 Dental caries, unspecified: Secondary | ICD-10-CM

## 2016-11-05 DIAGNOSIS — M264 Malocclusion, unspecified: Secondary | ICD-10-CM

## 2016-11-05 DIAGNOSIS — K0850 Unsatisfactory restoration of tooth, unspecified: Secondary | ICD-10-CM

## 2016-11-05 DIAGNOSIS — Z952 Presence of prosthetic heart valve: Secondary | ICD-10-CM

## 2016-11-05 DIAGNOSIS — K0530 Chronic periodontitis, unspecified: Secondary | ICD-10-CM

## 2016-11-05 DIAGNOSIS — K06020 Generalized gingival recession, unspecified: Secondary | ICD-10-CM

## 2016-11-05 NOTE — Patient Instructions (Signed)
PLAN: 1.  Patient needs to have follow-up with dentist of her choice for her complex medical and dental issues. Ideally I would recommend referral to an Oral and maxillofacial Prosthodontist but patient has limited income problems. I will attempt to see if patient is eligible for treatment at [facility] by the Special Care/Geriatric Clinic or possibly Graduate prosthodontic clinic in [place], [place]. The patient was provided contact information for the [facility] dental clinic in [place], [place].  2. I discussed the persistent periapical radiolucency in the area #7 with need to follow the patient with serial radiographs for bone fill in that area.  Patient is to contact Dental Medicine if acute problems arise in that area or tooth numbers 8 and 9 as well. 3. I will need to discuss medical and dental findings with Dr. Roxy Manns and Dr. Burt Knack concerning when the patient will be medically cleared to proceed with additional dental restorations and treatment. 4. Patient will require antibiotic premedication prior to invasive dental procedures per American Heart Association guidelines after the valve surgery.   [name], DDS

## 2016-11-05 NOTE — Progress Notes (Signed)
PROGRESS NOTE:  11/05/2016 Denise [name] [id number]  VITALS: BP (!) 141/62 (BP Location: Left Arm)   Pulse 68   Temp 98.2 F (36.8 C) (Oral)    Patient Active Problem List   Diagnosis Date Noted  . Severe aortic stenosis 07/19/2016    Priority: High  . 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 Limited mobility   . Physical deconditioning   . 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 Heart murmur 06/26/2016  . Neck pain 12/27/2015  . Low back pain 12/27/2015  . Wheezing 11/14/2015  . Cough 11/14/2015  . Breast cancer of upper-outer quadrant of right female breast ([place]) 09/20/2015  . Asthma with exacerbation 08/23/2015  . Spinal stenosis of lumbar region 08/22/2014  . Acute bronchitis 06/07/2014  . Eustachian tube dysfunction 03/15/2013  . Sprain of ankle, unspecified site 02/17/2013  . Peripheral edema 01/06/2012  . Nocturnal leg cramps 08/06/2011  . 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 Preop examination 09/03/2010  . Left knee DJD 09/03/2010  . Fatigue 09/03/2010  . Preventative health care 08/30/2010  . 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 Diabetes ([place]) 08/24/2007  . Hyperlipidemia 08/24/2007  . OBESITY 08/24/2007  . ANXIETY 08/24/2007  . DEPRESSION 08/24/2007  . Essential hypertension 08/24/2007  . Allergic rhinitis 08/24/2007  . Asthma 08/24/2007  . GERD 08/24/2007  . POSTMENOPAUSAL STATUS 08/24/2007  . ECZEMA 08/24/2007  . DEGENERATIVE JOINT DISEASE 08/24/2007  . SPINAL STENOSIS 08/24/2007  . Edema 08/24/2007   Past Medical History:  Diagnosis Date  . Abdominal pain, left lower quadrant 09/12/2008  . ALLERGIC RHINITIS 08/24/2007  . ANXIETY 08/24/2007  . Aortic stenosis, severe 07/19/2016  . ASTHMA 08/24/2007  . ASTHMA, WITH ACUTE EXACERBATION 03/14/2008  . Breast cancer ([place])   . DDD (degenerative disc disease), lumbar   . DEGENERATIVE JOINT DISEASE 08/24/2007  . DEPRESSION 08/24/2007  . DIABETES MELLITUS, TYPE II  08/24/2007  . ECZEMA 08/24/2007  . Edema 08/24/2007  . GERD 08/24/2007   not current (07/2014)  . Heart murmur   . HYPERCHOLESTEROLEMIA 08/24/2007  . HYPERLIPIDEMIA 08/24/2007  . HYPERTENSION 08/24/2007  . OBESITY 08/24/2007  . 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 POSTMENOPAUSAL STATUS 08/24/2007  . 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 SPINAL STENOSIS 08/24/2007    Allergies  Allergen Reactions  . Clonidine Hydrochloride Other (See Comments)    Bradycardia  . Erythromycin Palpitations  . Hydrocodone-Acetaminophen Nausea Only  . Pork-Derived Products Diarrhea and Nausea Only  . Rosiglitazone Maleate Swelling    SWELLING REACTION UNSPECIFIED    Current Outpatient Prescriptions  Medication Sig Dispense Refill  . albuterol (PROVENTIL HFA;VENTOLIN HFA) 108 (90 Base) MCG/ACT inhaler Inhale 2 puffs into the lungs every 6 (six) hours as needed for wheezing or shortness of breath. 1 Inhaler 11  . amLODipine (NORVASC) 10 MG tablet Take 1 tablet (10 mg total) by mouth daily. 90 tablet 3  . anastrozole (ARIMIDEX) 1 MG tablet Take 1 tablet (1 mg total) by mouth daily. 90 tablet 3  . aspirin 81 MG EC tablet Take 81 mg by mouth daily.      Marland Kitchen atorvastatin (LIPITOR) 40 MG tablet TAKE ONE TABLET BY MOUTH ONCE DAILY. 90 tablet 3  . budesonide-formoterol (SYMBICORT) 160-4.5 MCG/ACT inhaler Inhale 2 puffs into the lungs 2 (two) times daily. 3 Inhaler 3  . Calcium-Magnesium-Vitamin D (CALCIUM 1200+D3  PO) Take 1 tablet by mouth daily.    . cetirizine (ZYRTEC) 10 MG tablet TAKE ONE TABLET BY MOUTH ONCE DAILY AS NEEDED FOR ALLERGIES 90 tablet 0  . clopidogrel (PLAVIX) 75 MG tablet Take 1 tablet (75 mg total) by mouth daily with breakfast. 30 tablet 3  . fluticasone (FLONASE) 50 MCG/ACT nasal spray Place 2 sprays into both nostrils daily. 16 g  5  . furosemide (LASIX) 40 MG tablet Take 1 tablet (40 mg total) by mouth 2 (two) times daily. 180 tablet 1  . glimepiride (AMARYL) 2 MG tablet Take 0.5 tablets (1 mg total) by mouth daily with breakfast. 90 tablet 3  . lisinopril (PRINIVIL,ZESTRIL) 20 MG tablet Take 1 tablet (20 mg total) by mouth daily. 30 tablet 3  . metFORMIN (GLUCOPHAGE-XR) 500 MG 24 hr tablet 4 tabs mouth in the AM 360 tablet 3  . Multiple Vitamins-Minerals (ALIVE WOMENS 50+) TABS Take 1 tablet by mouth daily.    . Omega-3 Fatty Acids (FISH OIL) 1000 MG CAPS Take 1,000 mg by mouth daily.    Vladimir Faster Glycol-Propyl Glycol (SYSTANE ULTRA OP) Apply 1 drop to eye daily as needed (dry eyes).    . potassium chloride (MICRO-K) 10 MEQ CR capsule TAKE FOUR CAPSULES BY MOUTH ONCE DAILY 360 capsule 3  . amoxicillin (AMOXIL) 500 MG capsule Take 4 tablets by mouth one hour prior to dental work as needed. (Patient not taking: Reported on 11/05/2016) 4 capsule 1  . tiZANidine (ZANAFLEX) 4 MG tablet Take 4 mg by mouth 2 (two) times daily as needed for muscle spasms.    . traMADol (ULTRAM) 50 MG tablet Take 1-2 tablets (50-100 mg total) by mouth every 4 (four) hours as needed for moderate pain. (Patient not taking: Reported on 10/31/2016) 30 tablet 0   No current facility-administered medications for this visit.        Denise [name] is status post extraction of tooth numbers 7, 24, 25 with alveoloplasty and gross debridement remaining dentition the operating room with general anesthesia on 09/09/2016. Patient then underwent TAVR procedure with Dr. Burt Knack and Dr. Roxy Manns on 09/24/2016. Patient now presents for reevaluation of healing, periapical radiograph of #17 area, and discussion of other dental treatment needs.  SUBJECTIVE: Patient without complaints of pain or discomfort from her teeth. The patient denies having any swelling or discomfort in the area of #7. Patient denies having any pain associated with tooth numbers 8 and 9.  Patient patient indicates that her shortness of breath has improved with the TAVR procedure.  EXAM: There is no sign of infection or delayed healing from the previous extraction sites. There is no evidence of swelling or fistulous tract in the area of #7. Dental caries as noted before. Suboptimal dental restorations as before. Significant class III malocclusion as before.  Radiographic Interpretation: Periapical radiograph of tooth numbers 7 taken. There is a persistent periapical radiolucency in the area of #7 with some interval improvement in bone fill after dental extraction. Questionable involvement of #8 although tooth #8 tested positive by EPT previously.  ASSESSMENT: Post operative course is consistent with dental procedures performed in the operating room on 09/09/2016. Extraction sites appear to be healing in well clinically. 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  Patient needs to have follow-up with dentist  of her choice for her complex medical and dental issues. Ideally I would recommend referral to an Oral and maxillofacial Prosthodontist but patient has limited income problems. I will attempt to see if patient is eligible for treatment at [facility] by the Special Care/Geriatric Clinic or possibly Graduate prosthodontic clinic in [place], [place]. The patient was provided contact information for the [facility] dental clinic in [place], [place].  2. I discussed the persistent periapical radiolucency in the area #7 with need to follow the patient with serial radiographs for bone fill in that area.  Patient is to contact Dental Medicine if acute problems arise in that area or  tooth numbers 8 and 9 as well. 3. I will need to discuss medical and dental findings with Dr. Roxy Manns and Dr. Burt Knack concerning when the patient will be medically cleared to proceed with additional dental restorations and treatment. 4. Patient will require antibiotic premedication prior to invasive dental procedures per American Heart Association guidelines after the valve surgery.   [name], DDS

## 2016-11-06 ENCOUNTER — Ambulatory Visit: Payer: Medicare Other | Admitting: Hematology and Oncology

## 2016-11-06 ENCOUNTER — Encounter (HOSPITAL_COMMUNITY)
Admission: RE | Admit: 2016-11-06 | Discharge: 2016-11-06 | Disposition: A | Discharge status: Home / Self Care | Payer: Medicare Other | Source: Ambulatory Visit | Attending: Cardiovascular Disease | Admitting: Cardiovascular Disease

## 2016-11-06 DIAGNOSIS — Z7982 Long term (current) use of aspirin: Secondary | ICD-10-CM | POA: Diagnosis not present

## 2016-11-06 DIAGNOSIS — Z7951 Long term (current) use of inhaled steroids: Secondary | ICD-10-CM | POA: Diagnosis not present

## 2016-11-06 DIAGNOSIS — Z952 Presence of prosthetic heart valve: Secondary | ICD-10-CM | POA: Diagnosis not present

## 2016-11-06 DIAGNOSIS — Z79899 Other long term (current) drug therapy: Secondary | ICD-10-CM | POA: Diagnosis not present

## 2016-11-06 DIAGNOSIS — Z7984 Long term (current) use of oral hypoglycemic drugs: Secondary | ICD-10-CM | POA: Diagnosis not present

## 2016-11-06 DIAGNOSIS — Z7902 Long term (current) use of antithrombotics/antiplatelets: Secondary | ICD-10-CM | POA: Diagnosis not present

## 2016-11-06 NOTE — Progress Notes (Signed)
Daily Session Note  Patient Details  Name: Denise [name] MRN: [id number] Date of Birth: [birth year] Referring Provider:     CARDIAC REHAB PHASE II ORIENTATION from 10/31/2016 in [place]  Referring Provider  Sherren Mocha MD      Encounter Date: 11/06/2016  Check In:     Session Check In - 11/06/16 1242      Check-In   Location [facility]   Staff Present Cleda Mccreedy, MS, Exercise Physiologist;Royal Beirne Wilber Oliphant, RN, BSN   Supervising physician immediately available to respond to emergencies [facility] immediately available   Physician(s) Dr. Maryland Pink   Medication changes reported     No   Fall or balance concerns reported    No   Tobacco Cessation No Change   Warm-up and Cool-down Performed as group-led instruction   Resistance Training Performed No   VAD Patient? No     Pain Assessment   Currently in Pain? No/denies   Multiple Pain Sites No      Capillary Blood Glucose: No results found for this or any previous visit (from the past 24 hour(s)).    History  Smoking Status  . Former Smoker  Smokeless Tobacco  . Never Used    Comment: quit in 1070    Goals Met:  Exercise tolerated well Personal goals reviewed  Goals Unmet:  Not Applicable  Comments:  Pt started full exercise at cardiac rehab today.  Pt tolerated light exercise without difficulty, pt used rolling walker to assist with amublation. VSS, telemetry-SR with no ectopy, asymptomatic.  Medication list reconciled. Pt denies barriers to medication compliance.  PSYCHOSOCIAL ASSESSMENT:  PHQ-0. Pt completed pre assessment quality of life survey.  Pt scored the following     Quality of Life - 10/31/16 1021      Quality of Life Scores   Health/Function Pre 25.08 %   Socioeconomic Pre 27.07 %   Psych/Spiritual Pre 28.93 %   Family Pre 24 %   GLOBAL Pre 26.26 %        Pt exhibits positive coping skills, hopeful outlook with  supportive family. No psychosocial needs identified at this time, no psychosocial interventions necessary.    Pt enjoys crochet, sewing and gardening.  Pt desires to increase strength, stamina and balance.  Continue to lose weight.  Long term lose 40 pounds and develop exercise routine. Pt oriented to exercise equipment and routine. Understanding verbalized. [name] RN, BSN Cardiac and Pulmonary Rehab Nurse Navigator      Dr. Fransico Him is Medical Director for Cardiac Rehab at [facility].

## 2016-11-07 ENCOUNTER — Ambulatory Visit (HOSPITAL_BASED_OUTPATIENT_CLINIC_OR_DEPARTMENT_OTHER): Payer: Medicare Other | Admitting: Hematology and Oncology

## 2016-11-07 ENCOUNTER — Encounter: Payer: Self-pay | Admitting: Hematology and Oncology

## 2016-11-07 ENCOUNTER — Encounter: Payer: Self-pay | Admitting: Cardiovascular Disease

## 2016-11-07 DIAGNOSIS — C50411 Malignant neoplasm of upper-outer quadrant of right female breast: Secondary | ICD-10-CM | POA: Diagnosis not present

## 2016-11-07 DIAGNOSIS — Z952 Presence of prosthetic heart valve: Secondary | ICD-10-CM | POA: Diagnosis not present

## 2016-11-07 DIAGNOSIS — Z170 Estrogen receptor positive status [ER+]: Secondary | ICD-10-CM

## 2016-11-07 LAB — GLUCOSE, CAPILLARY
GLUCOSE-CAPILLARY: 92 mg/dL (ref 65–99)
Glucose-Capillary: 120 mg/dL — ABNORMAL HIGH (ref 65–99)

## 2016-11-07 MED ORDER — ANASTROZOLE 1 MG PO TABS: 1.0000 mg | ORAL_TABLET | Freq: Every day | ORAL | 3 refills | Status: DC

## 2016-11-07 NOTE — Progress Notes (Signed)
Patient Care Team: Biagio Borg, MD as PCP - General Denise [name], [name], NP as Nurse Practitioner (Hematology and Oncology)  DIAGNOSIS:  Encounter Diagnosis  Name Primary?  . Malignant neoplasm of upper-outer quadrant of right breast in female, estrogen receptor positive ([place])     SUMMARY OF ONCOLOGIC HISTORY:   Breast cancer of upper-outer quadrant of right female breast ([place])   09/18/2015 Initial Diagnosis    Screening right breast mass 9:00: 1 x 0.9 x 0.7 cm: Grade 2 IDC plus DCIS ER 100%, PR 90%, HER-2 negative ratio 1.38, Ki-67 10%; UOQ lesion 3 mm calcification plus distortion: Bx rec; T1 BN 0 stage IA clinical stage      10/17/2015 Surgery    Right lumpectomy ([place]): IDC grade 2, 1.2 cm, intermediate grade DCIS, margins neg although < 0.1 cm to medial and superior margins, 0/1 LN negative, ER 100%, PR 90%, HER-2 negative ratio 1.37, T1 cN0 stage IA, Oncotype DX 15, 9% ROR, low risk      11/27/2015 - 01/15/2016 Radiation Therapy    Adjuvant radiation therapy [name]). Right breast: 50.4 Gy in 28 fractions. Right breast boost: 14 Gy in 7 fractions.       02/08/2016 -  Anti-estrogen oral therapy    Anastrozole 1 mg daily. Planned duration of therapy: 5 years.        CHIEF COMPLIANT: Follow-up on anastrozole therapy  INTERVAL HISTORY: Denise [name] is a 71 year old with above-mentioned history of right breast cancer underwent lumpectomy and radiation and is currently on anastrozole. She is tolerating it extremely well. In May she had an aortic valve replacement surgery. She is breathing a lot better and has lost some weight.  REVIEW OF SYSTEMS:   Constitutional: Denies fevers, chills or abnormal weight loss Eyes: Denies blurriness of vision Ears, nose, mouth, throat, and face: Denies mucositis or sore throat Respiratory: Denies cough, dyspnea or wheezes Cardiovascular: Denies palpitation, chest discomfort Gastrointestinal:  Denies nausea, heartburn or  change in bowel habits Skin: Denies abnormal skin rashes Lymphatics: Denies new lymphadenopathy or easy bruising Neurological:Denies numbness, tingling or new weaknesses Behavioral/Psych: Mood is stable, no new changes  Extremities: No lower extremity edema Breast:  denies any pain or lumps or nodules in either breasts All other systems were reviewed with the patient and are negative.  I have reviewed the past medical history, past surgical history, social history and family history with the patient and they are unchanged from previous note.  ALLERGIES:  is allergic to clonidine hydrochloride; erythromycin; hydrocodone-acetaminophen; pork-derived products; and rosiglitazone maleate.  MEDICATIONS:  Current Outpatient Prescriptions  Medication Sig Dispense Refill  . albuterol (PROVENTIL HFA;VENTOLIN HFA) 108 (90 Base) MCG/ACT inhaler Inhale 2 puffs into the lungs every 6 (six) hours as needed for wheezing or shortness of breath. 1 Inhaler 11  . amLODipine (NORVASC) 10 MG tablet Take 1 tablet (10 mg total) by mouth daily. 90 tablet 3  . anastrozole (ARIMIDEX) 1 MG tablet Take 1 tablet (1 mg total) by mouth daily. 90 tablet 3  . aspirin 81 MG EC tablet Take 81 mg by mouth daily.      Marland Kitchen atorvastatin (LIPITOR) 40 MG tablet TAKE ONE TABLET BY MOUTH ONCE DAILY. 90 tablet 3  . budesonide-formoterol (SYMBICORT) 160-4.5 MCG/ACT inhaler Inhale 2 puffs into the lungs 2 (two) times daily. 3 Inhaler 3  . Calcium-Magnesium-Vitamin D (CALCIUM 1200+D3 PO) Take 1 tablet by mouth daily.    . cetirizine (ZYRTEC) 10 MG tablet TAKE ONE TABLET BY MOUTH ONCE  DAILY AS NEEDED FOR ALLERGIES 90 tablet 0  . clopidogrel (PLAVIX) 75 MG tablet Take 1 tablet (75 mg total) by mouth daily with breakfast. 30 tablet 3  . fluticasone (FLONASE) 50 MCG/ACT nasal spray Place 2 sprays into both nostrils daily. 16 g 5  . furosemide (LASIX) 40 MG tablet Take 1 tablet (40 mg total) by mouth 2 (two) times daily. 180 tablet 1  .  glimepiride (AMARYL) 2 MG tablet Take 0.5 tablets (1 mg total) by mouth daily with breakfast. 90 tablet 3  . lisinopril (PRINIVIL,ZESTRIL) 20 MG tablet Take 1 tablet (20 mg total) by mouth daily. 30 tablet 3  . metFORMIN (GLUCOPHAGE-XR) 500 MG 24 hr tablet 4 tabs mouth in the AM 360 tablet 3  . Multiple Vitamins-Minerals (ALIVE WOMENS 50+) TABS Take 1 tablet by mouth daily.    . Omega-3 Fatty Acids (FISH OIL) 1000 MG CAPS Take 1,000 mg by mouth daily.    Vladimir Faster Glycol-Propyl Glycol (SYSTANE ULTRA OP) Apply 1 drop to eye daily as needed (dry eyes).    . potassium chloride (MICRO-K) 10 MEQ CR capsule TAKE FOUR CAPSULES BY MOUTH ONCE DAILY 360 capsule 3  . tiZANidine (ZANAFLEX) 4 MG tablet Take 4 mg by mouth 2 (two) times daily as needed for muscle spasms.    . traMADol (ULTRAM) 50 MG tablet Take 1-2 tablets (50-100 mg total) by mouth every 4 (four) hours as needed for moderate pain. (Patient not taking: Reported on 10/31/2016) 30 tablet 0   No current facility-administered medications for this visit.     PHYSICAL EXAMINATION: ECOG PERFORMANCE STATUS: 1 - Symptomatic but completely ambulatory  Vitals:   11/07/16 0820  BP: (!) 148/60  Pulse: 67  Resp: 18  Temp: 98.8 F (37.1 C)   Filed Weights   11/07/16 0820  Weight: 222 lb 4.8 oz (100.8 kg)    GENERAL:alert, no distress and comfortable SKIN: skin color, texture, turgor are normal, no rashes or significant lesions EYES: normal, Conjunctiva are pink and non-injected, sclera clear OROPHARYNX:no exudate, no erythema and lips, buccal mucosa, and tongue normal  NECK: supple, thyroid normal size, non-tender, without nodularity LYMPH:  no palpable lymphadenopathy in the cervical, axillary or inguinal LUNGS: clear to auscultation and percussion with normal breathing effort HEART: regular rate & rhythm and no murmurs and no lower extremity edema ABDOMEN:abdomen soft, non-tender and normal bowel sounds MUSCULOSKELETAL:no cyanosis of  digits and no clubbing  NEURO: alert & oriented x 3 with fluent speech, no focal motor/sensory deficits EXTREMITIES: No lower extremity edema BREAST: No palpable masses or nodules in either right or left breasts. No palpable axillary supraclavicular or infraclavicular adenopathy no breast tenderness or nipple discharge. (exam performed in the presence of a chaperone)  LABORATORY DATA:  I have reviewed the data as listed   Chemistry      Component Value Date/Time   NA 141 09/25/2016 0330   NA 149 (H) 08/22/2016 1232   NA 145 09/27/2015 0840   K 3.2 (L) 09/25/2016 0330   K 3.7 09/27/2015 0840   CL 108 09/25/2016 0330   CO2 27 09/25/2016 0330   CO2 27 09/27/2015 0840   BUN <5 (L) 09/25/2016 0330   BUN 11 08/22/2016 1232   BUN 8.8 09/27/2015 0840   CREATININE 0.59 09/25/2016 0330   CREATININE 0.8 09/27/2015 0840   GLU 82 09/05/2014      Component Value Date/Time   CALCIUM 8.4 (L) 09/25/2016 0330   CALCIUM 9.2 09/27/2015 0840  ALKPHOS 62 09/20/2016 1119   ALKPHOS 61 09/27/2015 0840   AST 23 09/20/2016 1119   AST 22 09/27/2015 0840   ALT 21 09/20/2016 1119   ALT 28 09/27/2015 0840   BILITOT 0.7 09/20/2016 1119   BILITOT 0.65 09/27/2015 0840       Lab Results  Component Value Date   WBC 10.1 09/25/2016   HGB 11.4 (L) 09/25/2016   HCT 35.6 (L) 09/25/2016   MCV 82.0 09/25/2016   PLT 164 09/25/2016   NEUTROABS 6.1 12/28/2015    ASSESSMENT & PLAN:  Breast cancer of upper-outer quadrant of right female breast ([place]) Right lumpectomy 10/16/2068: IDC grade 2, 1.2 cm, intermediate grade DCIS, margins negative although less than 0.1 cm to medial margin and superior margin, 0/1 lymph node negative, ER 100%, PR 90%, HER-2 negative ratio 1.37, T1 cN0 stage IA Oncotype DX score 15: Risk of recurrence 9% Adjuvant radiation: 11/27/2015- 01/15/2016  Current treatment: Anastrozole 1 mg daily started 02/08/2016  Anastrozole toxicities: Denies any hot flashes or myalgias  Breast  cancer surveillance:  Mammograms will need to be scheduled I will ordered a mammogram to be done. Return to clinic in 1 year for follow-up   I spent 15 minutes talking to the patient of which more than half was spent in counseling and coordination of care.  No orders of the defined types were placed in this encounter.  The patient has a good understanding of the overall plan. she agrees with it. she will call with any problems that may develop before the next visit here.   Rulon Eisenmenger, MD 11/07/16

## 2016-11-07 NOTE — Assessment & Plan Note (Signed)
Right lumpectomy 10/16/2068: IDC grade 2, 1.2 cm, intermediate grade DCIS, margins negative although less than 0.1 cm to medial margin and superior margin, 0/1 lymph node negative, ER 100%, PR 90%, HER-2 negative ratio 1.37, T1 cN0 stage IA Oncotype DX score 15: Risk of recurrence 9% Adjuvant radiation: 11/27/2015- 01/15/2016  Current treatment: Anastrozole 1 mg daily started 02/08/2016  Anastrozole toxicities:  Breast cancer surveillance: Mammograms will need to be scheduled  Return to clinic in 1 year for follow-up

## 2016-11-08 ENCOUNTER — Ambulatory Visit (HOSPITAL_COMMUNITY)
Admission: RE | Admit: 2016-11-08 | Discharge: 2016-11-08 | Disposition: A | Discharge status: Home / Self Care | Payer: Medicare Other | Source: Ambulatory Visit | Attending: Cardiovascular Disease | Admitting: Cardiovascular Disease

## 2016-11-08 ENCOUNTER — Encounter (HOSPITAL_COMMUNITY): Payer: Medicare Other

## 2016-11-08 DIAGNOSIS — Z952 Presence of prosthetic heart valve: Secondary | ICD-10-CM | POA: Insufficient documentation

## 2016-11-08 DIAGNOSIS — I700 Atherosclerosis of aorta: Secondary | ICD-10-CM | POA: Diagnosis not present

## 2016-11-08 DIAGNOSIS — I350 Nonrheumatic aortic (valve) stenosis: Secondary | ICD-10-CM

## 2016-11-08 LAB — POCT I-STAT CREATININE: CREATININE: 0.7 mg/dL (ref 0.44–1.00)

## 2016-11-08 MED ORDER — IOPAMIDOL (ISOVUE-370) INJECTION 76%
INTRAVENOUS | Status: AC
  Administered 2016-11-08: 100 mL
  Filled 2016-11-08: qty 100

## 2016-11-10 NOTE — Progress Notes (Signed)
Cardiac Individual Treatment Plan  Patient Details  Name: Denise [name] MRN: [id number] Date of Birth: Sep 14, 1945 Referring Provider:     CARDIAC REHAB PHASE II ORIENTATION from 10/31/2016 in [place]  Referring Provider  Sherren Mocha MD      Initial Encounter Date:    CARDIAC REHAB PHASE II ORIENTATION from 10/31/2016 in [place]  Date  10/31/16  Referring Provider  Sherren Mocha MD      Visit Diagnosis: 09/24/16 S/P TAVR (transcatheter aortic valve replacement)  Patient's Home Medications on Admission:  Current Outpatient Prescriptions:  .  albuterol (PROVENTIL HFA;VENTOLIN HFA) 108 (90 Base) MCG/ACT inhaler, Inhale 2 puffs into the lungs every 6 (six) hours as needed for wheezing or shortness of breath., Disp: 1 Inhaler, Rfl: 11 .  amLODipine (NORVASC) 10 MG tablet, Take 1 tablet (10 mg total) by mouth daily., Disp: 90 tablet, Rfl: 3 .  anastrozole (ARIMIDEX) 1 MG tablet, Take 1 tablet (1 mg total) by mouth daily., Disp: 90 tablet, Rfl: 3 .  aspirin 81 MG EC tablet, Take 81 mg by mouth daily.  , Disp: , Rfl:  .  atorvastatin (LIPITOR) 40 MG tablet, TAKE ONE TABLET BY MOUTH ONCE DAILY., Disp: 90 tablet, Rfl: 3 .  budesonide-formoterol (SYMBICORT) 160-4.5 MCG/ACT inhaler, Inhale 2 puffs into the lungs 2 (two) times daily., Disp: 3 Inhaler, Rfl: 3 .  Calcium-Magnesium-Vitamin D (CALCIUM 1200+D3 PO), Take 1 tablet by mouth daily., Disp: , Rfl:  .  cetirizine (ZYRTEC) 10 MG tablet, TAKE ONE TABLET BY MOUTH ONCE DAILY AS NEEDED FOR ALLERGIES, Disp: 90 tablet, Rfl: 0 .  clopidogrel (PLAVIX) 75 MG tablet, Take 1 tablet (75 mg total) by mouth daily with breakfast., Disp: 30 tablet, Rfl: 3 .  fluticasone (FLONASE) 50 MCG/ACT nasal spray, Place 2 sprays into both nostrils daily., Disp: 16 g, Rfl: 5 .  furosemide (LASIX) 40 MG tablet, Take 1 tablet (40 mg total) by mouth 2 (two) times daily., Disp: 180 tablet, Rfl:  1 .  glimepiride (AMARYL) 2 MG tablet, Take 0.5 tablets (1 mg total) by mouth daily with breakfast., Disp: 90 tablet, Rfl: 3 .  lisinopril (PRINIVIL,ZESTRIL) 20 MG tablet, Take 1 tablet (20 mg total) by mouth daily., Disp: 30 tablet, Rfl: 3 .  metFORMIN (GLUCOPHAGE-XR) 500 MG 24 hr tablet, 4 tabs mouth in the AM, Disp: 360 tablet, Rfl: 3 .  Multiple Vitamins-Minerals (ALIVE WOMENS 50+) TABS, Take 1 tablet by mouth daily., Disp: , Rfl:  .  Omega-3 Fatty Acids (FISH OIL) 1000 MG CAPS, Take 1,000 mg by mouth daily., Disp: , Rfl:  .  Polyethyl Glycol-Propyl Glycol (SYSTANE ULTRA OP), Apply 1 drop to eye daily as needed (dry eyes)., Disp: , Rfl:  .  potassium chloride (MICRO-K) 10 MEQ CR capsule, TAKE FOUR CAPSULES BY MOUTH ONCE DAILY, Disp: 360 capsule, Rfl: 3 .  tiZANidine (ZANAFLEX) 4 MG tablet, Take 4 mg by mouth 2 (two) times daily as needed for muscle spasms., Disp: , Rfl:  .  traMADol (ULTRAM) 50 MG tablet, Take 1-2 tablets (50-100 mg total) by mouth every 4 (four) hours as needed for moderate pain. (Patient not taking: Reported on 10/31/2016), Disp: 30 tablet, Rfl: 0  Past Medical History: Past Medical History:  Diagnosis Date  . Abdominal pain, left lower quadrant 09/12/2008  . ALLERGIC RHINITIS 08/24/2007  . ANXIETY 08/24/2007  . Aortic stenosis, severe 07/19/2016  . ASTHMA 08/24/2007  . ASTHMA, WITH ACUTE EXACERBATION 03/14/2008  .  Breast cancer ([place])   . DDD (degenerative disc disease), lumbar   . DEGENERATIVE JOINT DISEASE 08/24/2007  . DEPRESSION 08/24/2007  . DIABETES MELLITUS, TYPE II 08/24/2007  . ECZEMA 08/24/2007  . Edema 08/24/2007  . GERD 08/24/2007   not current (07/2014)  . Heart murmur   . HYPERCHOLESTEROLEMIA 08/24/2007  . HYPERLIPIDEMIA 08/24/2007  . HYPERTENSION 08/24/2007  . OBESITY 08/24/2007  . 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 POSTMENOPAUSAL STATUS 08/24/2007  . 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 SPINAL STENOSIS 08/24/2007    Tobacco Use: History  Smoking Status  . Former Smoker  Smokeless Tobacco  . Never Used    Comment: quit in 1070    Labs: Recent Review Flowsheet Data    Labs for ITP Cardiac and Pulmonary Rehab Latest Ref Rng & Units 09/24/2016 09/24/2016 09/24/2016 09/24/2016 09/24/2016   Cholestrol 0 - 200 mg/dL - - - - -   LDLCALC 0 - 99 mg/dL - - - - -   HDL >39.00 mg/dL - - - - -   Trlycerides 0.0 - 149.0 mg/dL - - - - -   Hemoglobin A1c 4.8 - 5.6 % - - - - -   PHART 7.350 - 7.450 7.529(H) - - 7.411 7.399   PCO2ART 32.0 - 48.0 mmHg 34.9 - - 39.0 41.3   HCO3 20.0 - 28.0 mmol/L 29.1(H) - - 24.8 25.7   TCO2 0 - 100 mmol/L 30 25 26 26 27    ACIDBASEDEF 0.0 - 2.0 mmol/L - - - - -   O2SAT % 100.0 - - 99.0 99.0      Capillary Blood Glucose: Lab Results  Component Value Date   GLUCAP 92 11/06/2016   GLUCAP 120 (H) 11/06/2016   GLUCAP 115 (H) 09/26/2016   GLUCAP 105 (H) 09/26/2016   GLUCAP 137 (H) 09/25/2016     Exercise Target Goals:    Exercise Program Goal: Individual exercise prescription set with THRR, safety & activity barriers. Participant demonstrates ability to understand and report RPE using BORG scale, to self-measure pulse accurately, and to acknowledge the importance of the exercise prescription.  Exercise Prescription Goal: Starting with aerobic activity 30 plus minutes a day, 3 days per week for initial exercise prescription. Provide home exercise prescription and guidelines that participant acknowledges understanding prior to discharge.  Activity Barriers & Risk Stratification:     Activity Barriers & Cardiac Risk Stratification - 10/31/16 0742      Activity Barriers & Cardiac Risk Stratification   Activity Barriers Assistive Device   Cardiac Risk Stratification High      6 Minute Walk:     6 Minute Walk    Row Name 10/31/16 1013          6 Minute Walk   Phase Initial     Distance 2 feet     Walk Time 6 minutes     # of Rest Breaks 0     MPH 2     METS 2.2     RPE 13     VO2 Peak 7.7     Symptoms No     Resting HR 70 bpm     Resting BP 138/84     Max Ex. HR 116 bpm     Max Ex. BP 178/90     2 Minute Post BP 118/60  Oxygen Initial Assessment:   Oxygen Re-Evaluation:   Oxygen Discharge (Final Oxygen Re-Evaluation):   Initial Exercise Prescription:     Initial Exercise Prescription - 10/31/16 1000      Date of Initial Exercise RX and Referring Provider   Date 10/31/16   Referring Provider Sherren Mocha MD     Recumbant Bike   Level 1   Minutes 10   METs 2.2     NuStep   Level 1   SPM 80   Minutes 10   METs 2     Track   Laps 8   Minutes 10   METs 2.4     Prescription Details   Frequency (times per week) 3   Duration Progress to 45 minutes of aerobic exercise without signs/symptoms of physical distress     Intensity   THRR 40-80% of Max Heartrate 60-119   Ratings of Perceived Exertion 11-13   Perceived Dyspnea 0-4     Progression   Progression Continue to progress workloads to maintain intensity without signs/symptoms of physical distress.     Resistance Training   Training Prescription Yes   Weight 2   Reps 10-15      Perform Capillary Blood Glucose checks as needed.  Exercise Prescription Changes:   Exercise Comments:   Exercise Goals and Review:     Exercise Goals    Row Name 10/31/16 0743 10/31/16 1016           Exercise Goals   Increase Physical Activity (P)  Yes Yes      Intervention (P)  Provide advice, education, support and counseling about physical activity/exercise needs.;Develop an individualized exercise prescription for aerobic and resistive training based on initial evaluation findings, risk stratification, comorbidities and participant's personal goals. Provide advice, education, support and counseling about physical  activity/exercise needs.;Develop an individualized exercise prescription for aerobic and resistive training based on initial evaluation findings, risk stratification, comorbidities and participant's personal goals.      Expected Outcomes (P)  Achievement of increased cardiorespiratory fitness and enhanced flexibility, muscular endurance and strength shown through measurements of functional capacity and personal statement of participant. Achievement of increased cardiorespiratory fitness and enhanced flexibility, muscular endurance and strength shown through measurements of functional capacity and personal statement of participant.      Increase Strength and Stamina (P)  Yes Yes      Intervention (P)  Provide advice, education, support and counseling about physical activity/exercise needs.;Develop an individualized exercise prescription for aerobic and resistive training based on initial evaluation findings, risk stratification, comorbidities and participant's personal goals. Provide advice, education, support and counseling about physical activity/exercise needs.;Develop an individualized exercise prescription for aerobic and resistive training based on initial evaluation findings, risk stratification, comorbidities and participant's personal goals.      Expected Outcomes (P)  Achievement of increased cardiorespiratory fitness and enhanced flexibility, muscular endurance and strength shown through measurements of functional capacity and personal statement of participant. Achievement of increased cardiorespiratory fitness and enhanced flexibility, muscular endurance and strength shown through measurements of functional capacity and personal statement of participant.         Exercise Goals Re-Evaluation :    Discharge Exercise Prescription (Final Exercise Prescription Changes):   Nutrition:  Target Goals: Understanding of nutrition guidelines, daily intake of sodium 1500mg , cholesterol 200mg ,  calories 30% from fat and 7% or less from saturated fats, daily to have 5 or more servings of fruits and vegetables.  Biometrics:     Pre Biometrics - 10/31/16 1017  Pre Biometrics   Waist Circumference 41.5 inches   Hip Circumference 50 inches   Waist to Hip Ratio 0.83 %   Triceps Skinfold 41 mm   % Body Fat 52.1 %   Grip Strength 30 kg   Flexibility 9 in   Single Leg Stand 3.2 seconds       Nutrition Therapy Plan and Nutrition Goals:     Nutrition Therapy & Goals - 11/01/16 0746      Nutrition Therapy   Diet Carb Modified, Therapeutic Lifestyle Changes     Personal Nutrition Goals   Nutrition Goal Wt loss of 1-2 lb/week to a wt loss goal of 6-24 lb at graduation from [facility] [place]. Pt long-term wt loss goal wt is 180 lb.      Intervention Plan   Intervention Prescribe, educate and counsel regarding individualized specific dietary modifications aiming towards targeted core components such as weight, hypertension, lipid management, diabetes, heart failure and other comorbidities.   Expected Outcomes Short Term Goal: Understand basic principles of dietary content, such as calories, fat, sodium, cholesterol and nutrients.;Long Term Goal: Adherence to prescribed nutrition plan.      Nutrition Discharge: Nutrition Scores:     Nutrition Assessments - 11/01/16 0746      MEDFICTS Scores   Pre Score 30      Nutrition Goals Re-Evaluation:   Nutrition Goals Re-Evaluation:   Nutrition Goals Discharge (Final Nutrition Goals Re-Evaluation):   Psychosocial: Target Goals: Acknowledge presence or absence of significant depression and/or stress, maximize coping skills, provide positive support system. Participant is able to verbalize types and ability to use techniques and skills needed for reducing stress and depression.  Initial Review & Psychosocial Screening:     Initial Psych Review & Screening - 11/10/16 2049      Initial Review   Current issues with None  Identified     Family Dynamics   Good Support System? Yes     Barriers   Psychosocial barriers to participate in program There are no identifiable barriers or psychosocial needs.     Screening Interventions   Interventions Encouraged to exercise      Quality of Life Scores:     Quality of Life - 10/31/16 1021      Quality of Life Scores   Health/Function Pre 25.08 %   Socioeconomic Pre 27.07 %   Psych/Spiritual Pre 28.93 %   Family Pre 24 %   GLOBAL Pre 26.26 %      PHQ-9: Recent Review Flowsheet Data    Depression screen [facility] 2/9 06/26/2016 02/28/2016 12/27/2015 08/23/2015 02/15/2015   Decreased Interest 0 0 0 0 0   Down, Depressed, Hopeless 0 0 0 0 0   PHQ - 2 Score 0 0 0 0 0     Interpretation of Total Score  Total Score Depression Severity:  1-4 = Minimal depression, 5-9 = Mild depression, 10-14 = Moderate depression, 15-19 = Moderately severe depression, 20-27 = Severe depression   Psychosocial Evaluation and Intervention:     Psychosocial Evaluation - 11/10/16 2049      Psychosocial Evaluation & Interventions   Interventions Relaxation education;Stress management education;Encouraged to exercise with the program and follow exercise prescription   Continue Psychosocial Services  Follow up required by staff      Psychosocial Re-Evaluation:     Psychosocial Re-Evaluation    [place] Name 11/06/16 1457 11/10/16 2049           Psychosocial Re-Evaluation   Current issues with None Identified  None Identified      Interventions Encouraged to attend Cardiac Rehabilitation for the exercise;Stress management education Encouraged to attend Cardiac Rehabilitation for the exercise;Stress management education;Relaxation education      Continue Psychosocial Services  Follow up required by staff Follow up required by staff         Psychosocial Discharge (Final Psychosocial Re-Evaluation):     Psychosocial Re-Evaluation - 11/10/16 2049      Psychosocial Re-Evaluation    Current issues with None Identified   Interventions Encouraged to attend Cardiac Rehabilitation for the exercise;Stress management education;Relaxation education   Continue Psychosocial Services  Follow up required by staff      Vocational Rehabilitation: Provide vocational rehab assistance to qualifying candidates.   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 Participant will state understanding/return demonstration of topics presented.  Learning Barriers/Preferences:     Learning Barriers/Preferences - 10/31/16 0741      Learning Barriers/Preferences   Learning Preferences Skilled Demonstration;Written Material;Verbal Instruction      Education Topics: Count Your Pulse:  -Group instruction provided by verbal instruction, demonstration, patient participation and written materials to support subject.  Instructors address importance of being able to find your pulse and how to count your pulse when at home without a heart monitor.  Patients get hands on experience counting their pulse with staff help and individually.   Heart Attack, Angina, and Risk Factor Modification:  -Group instruction provided by verbal instruction, video, and written materials to support subject.  Instructors address signs and symptoms of angina and heart attacks.    Also discuss risk factors for heart disease and how to make changes to improve heart health risk factors.   Functional Fitness:  -Group instruction provided by verbal instruction, demonstration, patient participation, and written materials to support subject.  Instructors address safety measures for doing things around the house.   Discuss how to get up and down off the floor, how to pick things up properly, how to safely get out of a chair without assistance, and balance training.   Meditation and Mindfulness:  -Group instruction provided by verbal instruction, patient participation, and written materials to support subject.  Instructor addresses importance of mindfulness and meditation practice to help reduce stress and improve awareness.  Instructor also leads participants through a meditation exercise.    Stretching for Flexibility and Mobility:  -Group instruction provided by verbal instruction, patient participation, and written materials to support subject.  Instructors lead participants through series of stretches that are designed to increase flexibility thus improving mobility.  These stretches are additional exercise for major muscle groups that are typically performed during regular warm up and cool down.   Hands Only CPR:  -Group verbal, video, and participation provides a basic overview of AHA guidelines for community CPR. Role-play of emergencies allow participants the opportunity to practice calling for help and chest compression technique with discussion of AED use.   Hypertension: -Group verbal and written instruction that provides a basic overview of hypertension including the most recent diagnostic guidelines, risk factor reduction with self-care instructions and medication management.    Nutrition I class: Heart Healthy Eating:  -Group instruction provided by PowerPoint slides, verbal  discussion, and written materials to support subject matter. The instructor gives an explanation and review of the Therapeutic Lifestyle Changes diet recommendations, which includes a discussion on lipid goals, dietary fat, sodium, fiber, plant stanol/sterol esters, sugar, and the components of a well-balanced, healthy diet.   Nutrition II class: Lifestyle Skills:  -Group instruction provided by PowerPoint slides,  verbal discussion, and written materials to support subject matter. The instructor gives an explanation and review of label reading, grocery shopping for heart health, heart healthy recipe modifications, and ways to make healthier choices when eating out.   Diabetes Question & Answer:  -Group instruction provided by PowerPoint slides, verbal discussion, and written materials to support subject matter. The instructor gives an explanation and review of diabetes co-morbidities, pre- and post-prandial blood glucose goals, pre-exercise blood glucose goals, signs, symptoms, and treatment of hypoglycemia and hyperglycemia, and foot care basics.   Diabetes Blitz:  -Group instruction provided by PowerPoint slides, verbal discussion, and written materials to support subject matter. The instructor gives an explanation and review of the physiology behind type 1 and type 2 diabetes, diabetes medications and rational behind using different medications, pre- and post-prandial blood glucose recommendations and Hemoglobin A1c goals, diabetes diet, and exercise including blood glucose guidelines for exercising safely.    Portion Distortion:  -Group instruction provided by PowerPoint slides, verbal discussion, written materials, and food models to support subject matter. The instructor gives an explanation of serving size versus portion size, changes in portions sizes over the last 20 years, and what consists of a serving from each food group.   Stress Management:  -Group instruction provided by verbal instruction, video, and written materials to support subject matter.  Instructors review role of stress in heart disease and how to cope with stress positively.     Exercising on Your Own:  -Group instruction provided by verbal instruction, power point, and written materials to support subject.  Instructors discuss benefits of exercise, components of exercise, frequency and intensity of exercise, and end points for  exercise.  Also discuss use of nitroglycerin and activating EMS.  Review options of places to exercise outside of rehab.  Review guidelines for sex with heart disease.   Cardiac Drugs I:  -Group instruction provided by verbal instruction and written materials to support subject.  Instructor reviews cardiac drug classes: antiplatelets, anticoagulants, beta blockers, and statins.  Instructor discusses reasons, side effects, and lifestyle considerations for each drug class.   Cardiac Drugs II:  -Group instruction provided by verbal instruction and written materials to support subject.  Instructor reviews cardiac drug classes: angiotensin converting enzyme inhibitors (ACE-I), angiotensin II receptor blockers (ARBs), nitrates, and calcium channel blockers.  Instructor discusses reasons, side effects, and lifestyle considerations for each drug class.   Anatomy and Physiology of the Circulatory System:  Group verbal and written instruction and models provide basic cardiac anatomy and physiology, with the coronary electrical and arterial systems. Review of: AMI, Angina, Valve disease, Heart Failure, Peripheral Artery Disease, Cardiac Arrhythmia, Pacemakers, and the ICD.   Other Education:  -Group or individual verbal, written, or video instructions that support the educational goals of the cardiac rehab program.   Knowledge Questionnaire Score:     Knowledge Questionnaire Score - 10/31/16 0736      Knowledge Questionnaire Score   Pre Score 22/24      Core Components/Risk Factors/Patient Goals at Admission:     Personal Goals and Risk Factors at Admission - 10/31/16 1142      Core Components/Risk  Factors/Patient Goals on Admission   Diabetes Yes      Core Components/Risk Factors/Patient Goals Review:      Goals and Risk Factor Review    Row Name 11/10/16 2047             Core Components/Risk Factors/Patient Goals Review   Personal Goals Review Weight  Management/Obesity;Lipids;Other;Hypertension;Diabetes       Review Pt is off to a good start toward reduction of modifiable risk factors          Core Components/Risk Factors/Patient Goals at Discharge (Final Review):      Goals and Risk Factor Review - 11/10/16 2047      Core Components/Risk Factors/Patient Goals Review   Personal Goals Review Weight Management/Obesity;Lipids;Other;Hypertension;Diabetes   Review Pt is off to a good start toward reduction of modifiable risk factors      ITP Comments:     ITP Comments    Row Name 10/31/16 0735           ITP Comments Dr. Fransico Him, Medical Director          Comments:  Carolyne Littles is off to a good start and  Making the expected progress toward personal goals after completing 2 sessions. Psychosocial assessment - Pt exhibits positive coping skills, hopeful outlook with supportive family. No psychosocial needs identified at this time, no psychosocial interventions necessary.  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 Recommend continued exercise and life style modification education including  stress management and relaxation techniques to decrease cardiac risk profile. Cherre Huger, BSN Cardiac and Training and development officer

## 2016-11-11 ENCOUNTER — Encounter (HOSPITAL_COMMUNITY)
Admission: RE | Admit: 2016-11-11 | Discharge: 2016-11-11 | Disposition: A | Discharge status: Home / Self Care | Payer: Medicare Other | Source: Ambulatory Visit | Attending: Cardiovascular Disease | Admitting: Cardiovascular Disease

## 2016-11-11 DIAGNOSIS — Z952 Presence of prosthetic heart valve: Secondary | ICD-10-CM | POA: Diagnosis not present

## 2016-11-11 DIAGNOSIS — Z7902 Long term (current) use of antithrombotics/antiplatelets: Secondary | ICD-10-CM | POA: Diagnosis not present

## 2016-11-11 DIAGNOSIS — Z7951 Long term (current) use of inhaled steroids: Secondary | ICD-10-CM | POA: Diagnosis not present

## 2016-11-11 DIAGNOSIS — Z7982 Long term (current) use of aspirin: Secondary | ICD-10-CM | POA: Diagnosis not present

## 2016-11-11 DIAGNOSIS — Z7984 Long term (current) use of oral hypoglycemic drugs: Secondary | ICD-10-CM | POA: Diagnosis not present

## 2016-11-11 DIAGNOSIS — Z79899 Other long term (current) drug therapy: Secondary | ICD-10-CM | POA: Diagnosis not present

## 2016-11-11 LAB — GLUCOSE, CAPILLARY
GLUCOSE-CAPILLARY: 103 mg/dL — AB (ref 65–99)
Glucose-Capillary: 90 mg/dL (ref 65–99)

## 2016-11-13 ENCOUNTER — Encounter (HOSPITAL_COMMUNITY)
Admission: RE | Admit: 2016-11-13 | Discharge: 2016-11-13 | Disposition: A | Discharge status: Home / Self Care | Payer: Medicare Other | Source: Ambulatory Visit | Attending: Cardiovascular Disease | Admitting: Cardiovascular Disease

## 2016-11-13 ENCOUNTER — Telehealth: Payer: Self-pay | Admitting: *Deleted

## 2016-11-13 DIAGNOSIS — Z7902 Long term (current) use of antithrombotics/antiplatelets: Secondary | ICD-10-CM | POA: Diagnosis not present

## 2016-11-13 DIAGNOSIS — Z7984 Long term (current) use of oral hypoglycemic drugs: Secondary | ICD-10-CM | POA: Diagnosis not present

## 2016-11-13 DIAGNOSIS — Z7951 Long term (current) use of inhaled steroids: Secondary | ICD-10-CM | POA: Diagnosis not present

## 2016-11-13 DIAGNOSIS — Z79899 Other long term (current) drug therapy: Secondary | ICD-10-CM | POA: Diagnosis not present

## 2016-11-13 DIAGNOSIS — Z952 Presence of prosthetic heart valve: Secondary | ICD-10-CM | POA: Diagnosis not present

## 2016-11-13 DIAGNOSIS — Z7982 Long term (current) use of aspirin: Secondary | ICD-10-CM | POA: Diagnosis not present

## 2016-11-13 LAB — GLUCOSE, CAPILLARY
GLUCOSE-CAPILLARY: 101 mg/dL — AB (ref 65–99)
GLUCOSE-CAPILLARY: 71 mg/dL (ref 65–99)

## 2016-11-13 NOTE — Telephone Encounter (Signed)
New Message  Pt is returning your call 

## 2016-11-13 NOTE — Telephone Encounter (Signed)
-----   Message from Sherren Mocha, MD sent at 11/08/2016  5:48 PM EDT ----- Normal study. Suspect elevated gradients related primarily to high flow and patient/prosthesis mismatch. Continue same Rx.

## 2016-11-13 NOTE — Telephone Encounter (Signed)
Patient informed. 

## 2016-11-15 ENCOUNTER — Encounter (HOSPITAL_COMMUNITY)
Admission: RE | Admit: 2016-11-15 | Discharge: 2016-11-15 | Disposition: A | Discharge status: Home / Self Care | Payer: Medicare Other | Source: Ambulatory Visit | Attending: Cardiovascular Disease | Admitting: Cardiovascular Disease

## 2016-11-15 DIAGNOSIS — Z7982 Long term (current) use of aspirin: Secondary | ICD-10-CM | POA: Diagnosis not present

## 2016-11-15 DIAGNOSIS — Z7984 Long term (current) use of oral hypoglycemic drugs: Secondary | ICD-10-CM | POA: Diagnosis not present

## 2016-11-15 DIAGNOSIS — Z952 Presence of prosthetic heart valve: Secondary | ICD-10-CM

## 2016-11-15 DIAGNOSIS — Z79899 Other long term (current) drug therapy: Secondary | ICD-10-CM | POA: Diagnosis not present

## 2016-11-15 DIAGNOSIS — Z7902 Long term (current) use of antithrombotics/antiplatelets: Secondary | ICD-10-CM | POA: Diagnosis not present

## 2016-11-15 DIAGNOSIS — Z7951 Long term (current) use of inhaled steroids: Secondary | ICD-10-CM | POA: Diagnosis not present

## 2016-11-15 LAB — GLUCOSE, CAPILLARY
Glucose-Capillary: 121 mg/dL — ABNORMAL HIGH (ref 65–99)
Glucose-Capillary: 74 mg/dL (ref 65–99)

## 2016-11-18 ENCOUNTER — Encounter (HOSPITAL_COMMUNITY)
Admission: RE | Admit: 2016-11-18 | Discharge: 2016-11-18 | Disposition: A | Discharge status: Home / Self Care | Payer: Medicare Other | Source: Ambulatory Visit | Attending: Cardiovascular Disease | Admitting: Cardiovascular Disease

## 2016-11-18 DIAGNOSIS — Z7951 Long term (current) use of inhaled steroids: Secondary | ICD-10-CM | POA: Diagnosis not present

## 2016-11-18 DIAGNOSIS — Z79899 Other long term (current) drug therapy: Secondary | ICD-10-CM | POA: Diagnosis not present

## 2016-11-18 DIAGNOSIS — Z952 Presence of prosthetic heart valve: Secondary | ICD-10-CM

## 2016-11-18 DIAGNOSIS — Z7902 Long term (current) use of antithrombotics/antiplatelets: Secondary | ICD-10-CM | POA: Diagnosis not present

## 2016-11-18 DIAGNOSIS — Z7984 Long term (current) use of oral hypoglycemic drugs: Secondary | ICD-10-CM | POA: Diagnosis not present

## 2016-11-18 DIAGNOSIS — Z7982 Long term (current) use of aspirin: Secondary | ICD-10-CM | POA: Diagnosis not present

## 2016-11-19 ENCOUNTER — Encounter: Payer: Self-pay | Admitting: Internal Medicine

## 2016-11-19 NOTE — Progress Notes (Signed)
Reviewed home exercise with pt today.  Pt plans to walk and ride stationary bike for exercise.Discussed exercise progression and bouts of exercise to build for a total time of 20-30 minutes. Reviewed THR, pulse, RPE, sign and symptoms, and when to call 911 or MD.  Also discussed weather considerations and indoor options.  Pt voiced understanding.     Denise [name] [employer]

## 2016-11-20 ENCOUNTER — Encounter (HOSPITAL_COMMUNITY)
Admission: RE | Admit: 2016-11-20 | Discharge: 2016-11-20 | Disposition: A | Discharge status: Home / Self Care | Payer: Medicare Other | Source: Ambulatory Visit | Attending: Cardiovascular Disease | Admitting: Cardiovascular Disease

## 2016-11-20 DIAGNOSIS — Z7902 Long term (current) use of antithrombotics/antiplatelets: Secondary | ICD-10-CM | POA: Diagnosis not present

## 2016-11-20 DIAGNOSIS — Z952 Presence of prosthetic heart valve: Secondary | ICD-10-CM

## 2016-11-20 DIAGNOSIS — Z79899 Other long term (current) drug therapy: Secondary | ICD-10-CM | POA: Diagnosis not present

## 2016-11-20 DIAGNOSIS — Z7951 Long term (current) use of inhaled steroids: Secondary | ICD-10-CM | POA: Diagnosis not present

## 2016-11-20 DIAGNOSIS — Z7984 Long term (current) use of oral hypoglycemic drugs: Secondary | ICD-10-CM | POA: Diagnosis not present

## 2016-11-20 DIAGNOSIS — Z7982 Long term (current) use of aspirin: Secondary | ICD-10-CM | POA: Diagnosis not present

## 2016-11-21 ENCOUNTER — Ambulatory Visit
Admission: RE | Admit: 2016-11-21 | Discharge: 2016-11-21 | Disposition: A | Discharge status: Home / Self Care | Payer: Medicare Other | Source: Ambulatory Visit | Attending: Hematology and Oncology | Admitting: Hematology and Oncology

## 2016-11-21 DIAGNOSIS — R928 Other abnormal and inconclusive findings on diagnostic imaging of breast: Secondary | ICD-10-CM | POA: Diagnosis not present

## 2016-11-21 DIAGNOSIS — Z170 Estrogen receptor positive status [ER+]: Principal | ICD-10-CM

## 2016-11-21 DIAGNOSIS — C50411 Malignant neoplasm of upper-outer quadrant of right female breast: Secondary | ICD-10-CM

## 2016-11-21 HISTORY — DX: Personal history of irradiation: Z92.3

## 2016-11-22 ENCOUNTER — Encounter (HOSPITAL_COMMUNITY)
Admission: RE | Admit: 2016-11-22 | Discharge: 2016-11-22 | Disposition: A | Discharge status: Home / Self Care | Payer: Medicare Other | Source: Ambulatory Visit | Attending: Cardiovascular Disease | Admitting: Cardiovascular Disease

## 2016-11-22 DIAGNOSIS — Z7951 Long term (current) use of inhaled steroids: Secondary | ICD-10-CM | POA: Diagnosis not present

## 2016-11-22 DIAGNOSIS — Z7982 Long term (current) use of aspirin: Secondary | ICD-10-CM | POA: Diagnosis not present

## 2016-11-22 DIAGNOSIS — Z7902 Long term (current) use of antithrombotics/antiplatelets: Secondary | ICD-10-CM | POA: Diagnosis not present

## 2016-11-22 DIAGNOSIS — Z952 Presence of prosthetic heart valve: Secondary | ICD-10-CM

## 2016-11-22 DIAGNOSIS — Z7984 Long term (current) use of oral hypoglycemic drugs: Secondary | ICD-10-CM | POA: Diagnosis not present

## 2016-11-22 DIAGNOSIS — Z79899 Other long term (current) drug therapy: Secondary | ICD-10-CM | POA: Diagnosis not present

## 2016-11-25 ENCOUNTER — Encounter (HOSPITAL_COMMUNITY): Admission: RE | Admit: 2016-11-25 | Payer: Medicare Other | Source: Ambulatory Visit

## 2016-11-27 ENCOUNTER — Encounter (HOSPITAL_COMMUNITY)
Admission: RE | Admit: 2016-11-27 | Discharge: 2016-11-27 | Disposition: A | Discharge status: Home / Self Care | Payer: Medicare Other | Source: Ambulatory Visit | Attending: Cardiovascular Disease | Admitting: Cardiovascular Disease

## 2016-11-27 DIAGNOSIS — M170 Bilateral primary osteoarthritis of knee: Secondary | ICD-10-CM | POA: Diagnosis not present

## 2016-11-27 DIAGNOSIS — Z853 Personal history of malignant neoplasm of breast: Secondary | ICD-10-CM | POA: Diagnosis not present

## 2016-11-27 DIAGNOSIS — I10 Essential (primary) hypertension: Secondary | ICD-10-CM | POA: Diagnosis not present

## 2016-11-27 DIAGNOSIS — E7800 Pure hypercholesterolemia, unspecified: Secondary | ICD-10-CM | POA: Diagnosis not present

## 2016-11-27 DIAGNOSIS — Z6841 Body Mass Index (BMI) 40.0 and over, adult: Secondary | ICD-10-CM | POA: Diagnosis not present

## 2016-11-27 DIAGNOSIS — Z7951 Long term (current) use of inhaled steroids: Secondary | ICD-10-CM | POA: Diagnosis not present

## 2016-11-27 DIAGNOSIS — Z7982 Long term (current) use of aspirin: Secondary | ICD-10-CM | POA: Diagnosis not present

## 2016-11-27 DIAGNOSIS — E119 Type 2 diabetes mellitus without complications: Secondary | ICD-10-CM | POA: Insufficient documentation

## 2016-11-27 DIAGNOSIS — Z952 Presence of prosthetic heart valve: Secondary | ICD-10-CM | POA: Insufficient documentation

## 2016-11-27 DIAGNOSIS — Z87891 Personal history of nicotine dependence: Secondary | ICD-10-CM | POA: Diagnosis not present

## 2016-11-27 DIAGNOSIS — E669 Obesity, unspecified: Secondary | ICD-10-CM | POA: Diagnosis not present

## 2016-11-27 DIAGNOSIS — J45909 Unspecified asthma, uncomplicated: Secondary | ICD-10-CM | POA: Insufficient documentation

## 2016-11-27 DIAGNOSIS — Z79899 Other long term (current) drug therapy: Secondary | ICD-10-CM | POA: Diagnosis not present

## 2016-11-27 DIAGNOSIS — Z7984 Long term (current) use of oral hypoglycemic drugs: Secondary | ICD-10-CM | POA: Diagnosis not present

## 2016-11-27 DIAGNOSIS — Z7902 Long term (current) use of antithrombotics/antiplatelets: Secondary | ICD-10-CM | POA: Insufficient documentation

## 2016-11-27 DIAGNOSIS — M4800 Spinal stenosis, site unspecified: Secondary | ICD-10-CM | POA: Diagnosis not present

## 2016-11-27 LAB — GLUCOSE, CAPILLARY: GLUCOSE-CAPILLARY: 81 mg/dL (ref 65–99)

## 2016-11-27 NOTE — Progress Notes (Signed)
Nutrition Note Spoke with pt. Pt ate 2 scrambled eggs, sausage and a fruit/yogurt/granola parfait for breakfast at 9:15 am. Pt reports her MD Ok'd her to hold her Amaryl. Pt continues taking Metformin for DM. CBG at 11:45 am 81 mg/dL. Pt instructed to eat a snack ~1 hour prior to exercise. Pt expressed understanding via feedback method. Continue client-centered nutrition education by RD as part of interdisciplinary care.  Monitor and evaluate progress toward nutrition goal with team.  Derek Mound, M.Ed, RD, LDN, CDE 11/27/2016 12:02 PM

## 2016-11-27 NOTE — Progress Notes (Signed)
Incomplete Session Note  Patient Details  Name: Denise [name] MRN: [id number] Date of Birth: 01-Apr-1946 Referring Provider:     CARDIAC REHAB PHASE II ORIENTATION from 10/31/2016 in [place]  Referring Provider  Sherren Mocha MD      Denise [name] did not complete her rehab session.  CBG 81. Denise [name] spent the night on [place] with a friend that is in the hospital and ate breakfast around 0930. Patient was counseled by the dietitian. Denise [name] was asymptomatic. Patient was given a ginger ale. No exercise per protocol. Denise [name] is going to eat lunch and plans to return to exercise on her next scheduled session. No complaints upon exit from cardiac rehab.Barnet Pall, RN,BSN 11/27/2016 12:19 PM

## 2016-11-29 ENCOUNTER — Encounter (HOSPITAL_COMMUNITY)
Admission: RE | Admit: 2016-11-29 | Discharge: 2016-11-29 | Disposition: A | Discharge status: Home / Self Care | Payer: Medicare Other | Source: Ambulatory Visit | Attending: Cardiovascular Disease | Admitting: Cardiovascular Disease

## 2016-11-29 DIAGNOSIS — Z952 Presence of prosthetic heart valve: Secondary | ICD-10-CM

## 2016-11-29 DIAGNOSIS — Z7984 Long term (current) use of oral hypoglycemic drugs: Secondary | ICD-10-CM | POA: Diagnosis not present

## 2016-11-29 DIAGNOSIS — Z7982 Long term (current) use of aspirin: Secondary | ICD-10-CM | POA: Diagnosis not present

## 2016-11-29 DIAGNOSIS — Z79899 Other long term (current) drug therapy: Secondary | ICD-10-CM | POA: Diagnosis not present

## 2016-11-29 DIAGNOSIS — Z7951 Long term (current) use of inhaled steroids: Secondary | ICD-10-CM | POA: Diagnosis not present

## 2016-11-29 DIAGNOSIS — Z7902 Long term (current) use of antithrombotics/antiplatelets: Secondary | ICD-10-CM | POA: Diagnosis not present

## 2016-12-02 ENCOUNTER — Encounter (HOSPITAL_COMMUNITY)
Admission: RE | Admit: 2016-12-02 | Discharge: 2016-12-02 | Disposition: A | Discharge status: Home / Self Care | Payer: Medicare Other | Source: Ambulatory Visit | Attending: Cardiovascular Disease | Admitting: Cardiovascular Disease

## 2016-12-02 DIAGNOSIS — Z7902 Long term (current) use of antithrombotics/antiplatelets: Secondary | ICD-10-CM | POA: Diagnosis not present

## 2016-12-02 DIAGNOSIS — Z79899 Other long term (current) drug therapy: Secondary | ICD-10-CM | POA: Diagnosis not present

## 2016-12-02 DIAGNOSIS — Z7984 Long term (current) use of oral hypoglycemic drugs: Secondary | ICD-10-CM | POA: Diagnosis not present

## 2016-12-02 DIAGNOSIS — Z952 Presence of prosthetic heart valve: Secondary | ICD-10-CM | POA: Diagnosis not present

## 2016-12-02 DIAGNOSIS — Z7982 Long term (current) use of aspirin: Secondary | ICD-10-CM | POA: Diagnosis not present

## 2016-12-02 DIAGNOSIS — Z7951 Long term (current) use of inhaled steroids: Secondary | ICD-10-CM | POA: Diagnosis not present

## 2016-12-04 ENCOUNTER — Encounter (HOSPITAL_COMMUNITY)
Admission: RE | Admit: 2016-12-04 | Discharge: 2016-12-04 | Disposition: A | Discharge status: Home / Self Care | Payer: Medicare Other | Source: Ambulatory Visit | Attending: Cardiovascular Disease | Admitting: Cardiovascular Disease

## 2016-12-04 DIAGNOSIS — Z79899 Other long term (current) drug therapy: Secondary | ICD-10-CM | POA: Diagnosis not present

## 2016-12-04 DIAGNOSIS — Z7984 Long term (current) use of oral hypoglycemic drugs: Secondary | ICD-10-CM | POA: Diagnosis not present

## 2016-12-04 DIAGNOSIS — Z952 Presence of prosthetic heart valve: Secondary | ICD-10-CM

## 2016-12-04 DIAGNOSIS — Z7951 Long term (current) use of inhaled steroids: Secondary | ICD-10-CM | POA: Diagnosis not present

## 2016-12-04 DIAGNOSIS — Z7902 Long term (current) use of antithrombotics/antiplatelets: Secondary | ICD-10-CM | POA: Diagnosis not present

## 2016-12-04 DIAGNOSIS — Z7982 Long term (current) use of aspirin: Secondary | ICD-10-CM | POA: Diagnosis not present

## 2016-12-06 ENCOUNTER — Encounter (HOSPITAL_COMMUNITY)
Admission: RE | Admit: 2016-12-06 | Discharge: 2016-12-06 | Disposition: A | Discharge status: Home / Self Care | Payer: Medicare Other | Source: Ambulatory Visit | Attending: Cardiovascular Disease | Admitting: Cardiovascular Disease

## 2016-12-06 DIAGNOSIS — Z79899 Other long term (current) drug therapy: Secondary | ICD-10-CM | POA: Diagnosis not present

## 2016-12-06 DIAGNOSIS — Z952 Presence of prosthetic heart valve: Secondary | ICD-10-CM | POA: Diagnosis not present

## 2016-12-06 DIAGNOSIS — Z7902 Long term (current) use of antithrombotics/antiplatelets: Secondary | ICD-10-CM | POA: Diagnosis not present

## 2016-12-06 DIAGNOSIS — Z7984 Long term (current) use of oral hypoglycemic drugs: Secondary | ICD-10-CM | POA: Diagnosis not present

## 2016-12-06 DIAGNOSIS — Z7982 Long term (current) use of aspirin: Secondary | ICD-10-CM | POA: Diagnosis not present

## 2016-12-06 DIAGNOSIS — Z7951 Long term (current) use of inhaled steroids: Secondary | ICD-10-CM | POA: Diagnosis not present

## 2016-12-06 LAB — GLUCOSE, CAPILLARY: GLUCOSE-CAPILLARY: 103 mg/dL — AB (ref 65–99)

## 2016-12-09 ENCOUNTER — Encounter (HOSPITAL_COMMUNITY)
Admission: RE | Admit: 2016-12-09 | Discharge: 2016-12-09 | Disposition: A | Discharge status: Home / Self Care | Payer: Medicare Other | Source: Ambulatory Visit | Attending: Cardiovascular Disease | Admitting: Cardiovascular Disease

## 2016-12-09 DIAGNOSIS — Z7982 Long term (current) use of aspirin: Secondary | ICD-10-CM | POA: Diagnosis not present

## 2016-12-09 DIAGNOSIS — Z7951 Long term (current) use of inhaled steroids: Secondary | ICD-10-CM | POA: Diagnosis not present

## 2016-12-09 DIAGNOSIS — Z7984 Long term (current) use of oral hypoglycemic drugs: Secondary | ICD-10-CM | POA: Diagnosis not present

## 2016-12-09 DIAGNOSIS — Z952 Presence of prosthetic heart valve: Secondary | ICD-10-CM | POA: Diagnosis not present

## 2016-12-09 DIAGNOSIS — Z79899 Other long term (current) drug therapy: Secondary | ICD-10-CM | POA: Diagnosis not present

## 2016-12-09 DIAGNOSIS — Z7902 Long term (current) use of antithrombotics/antiplatelets: Secondary | ICD-10-CM | POA: Diagnosis not present

## 2016-12-09 NOTE — Progress Notes (Signed)
Cardiac Individual Treatment Plan  Patient Details  Name: Denise [name] MRN: [id number] Date of Birth: [birth year] Referring Provider:     CARDIAC REHAB PHASE II ORIENTATION from 10/31/2016 in [place]  Referring Provider  Sherren Mocha MD      Initial Encounter Date:    CARDIAC REHAB PHASE II ORIENTATION from 10/31/2016 in [place]  Date  10/31/16  Referring Provider  Sherren Mocha MD      Visit Diagnosis: 09/24/16 S/P TAVR (transcatheter aortic valve replacement)  Patient's Home Medications on Admission:  Current Outpatient Prescriptions:  .  albuterol (PROVENTIL HFA;VENTOLIN HFA) 108 (90 Base) MCG/ACT inhaler, Inhale 2 puffs into the lungs every 6 (six) hours as needed for wheezing or shortness of breath., Disp: 1 Inhaler, Rfl: 11 .  amLODipine (NORVASC) 10 MG tablet, Take 1 tablet (10 mg total) by mouth daily., Disp: 90 tablet, Rfl: 3 .  anastrozole (ARIMIDEX) 1 MG tablet, Take 1 tablet (1 mg total) by mouth daily., Disp: 90 tablet, Rfl: 3 .  aspirin 81 MG EC tablet, Take 81 mg by mouth daily.  , Disp: , Rfl:  .  atorvastatin (LIPITOR) 40 MG tablet, TAKE ONE TABLET BY MOUTH ONCE DAILY., Disp: 90 tablet, Rfl: 3 .  budesonide-formoterol (SYMBICORT) 160-4.5 MCG/ACT inhaler, Inhale 2 puffs into the lungs 2 (two) times daily., Disp: 3 Inhaler, Rfl: 3 .  Calcium-Magnesium-Vitamin D (CALCIUM 1200+D3 PO), Take 1 tablet by mouth daily., Disp: , Rfl:  .  cetirizine (ZYRTEC) 10 MG tablet, TAKE ONE TABLET BY MOUTH ONCE DAILY AS NEEDED FOR ALLERGIES, Disp: 90 tablet, Rfl: 0 .  clopidogrel (PLAVIX) 75 MG tablet, Take 1 tablet (75 mg total) by mouth daily with breakfast., Disp: 30 tablet, Rfl: 3 .  fluticasone (FLONASE) 50 MCG/ACT nasal spray, Place 2 sprays into both nostrils daily., Disp: 16 g, Rfl: 5 .  furosemide (LASIX) 40 MG tablet, Take 1 tablet (40 mg total) by mouth 2 (two) times daily., Disp: 180 tablet, Rfl:  1 .  glimepiride (AMARYL) 2 MG tablet, Take 0.5 tablets (1 mg total) by mouth daily with breakfast., Disp: 90 tablet, Rfl: 3 .  lisinopril (PRINIVIL,ZESTRIL) 20 MG tablet, Take 1 tablet (20 mg total) by mouth daily., Disp: 30 tablet, Rfl: 3 .  metFORMIN (GLUCOPHAGE-XR) 500 MG 24 hr tablet, 4 tabs mouth in the AM, Disp: 360 tablet, Rfl: 3 .  Multiple Vitamins-Minerals (ALIVE WOMENS 50+) TABS, Take 1 tablet by mouth daily., Disp: , Rfl:  .  Omega-3 Fatty Acids (FISH OIL) 1000 MG CAPS, Take 1,000 mg by mouth daily., Disp: , Rfl:  .  Polyethyl Glycol-Propyl Glycol (SYSTANE ULTRA OP), Apply 1 drop to eye daily as needed (dry eyes)., Disp: , Rfl:  .  potassium chloride (MICRO-K) 10 MEQ CR capsule, TAKE FOUR CAPSULES BY MOUTH ONCE DAILY, Disp: 360 capsule, Rfl: 3 .  tiZANidine (ZANAFLEX) 4 MG tablet, Take 4 mg by mouth 2 (two) times daily as needed for muscle spasms., Disp: , Rfl:  .  traMADol (ULTRAM) 50 MG tablet, Take 1-2 tablets (50-100 mg total) by mouth every 4 (four) hours as needed for moderate pain. (Patient not taking: Reported on 10/31/2016), Disp: 30 tablet, Rfl: 0  Past Medical History: Past Medical History:  Diagnosis Date  . Abdominal pain, left lower quadrant 09/12/2008  . ALLERGIC RHINITIS 08/24/2007  . ANXIETY 08/24/2007  . Aortic stenosis, severe 07/19/2016  . ASTHMA 08/24/2007  . ASTHMA, WITH ACUTE EXACERBATION 03/14/2008  .  Breast cancer ([place])   . DDD (degenerative disc disease), lumbar   . DEGENERATIVE JOINT DISEASE 08/24/2007  . DEPRESSION 08/24/2007  . DIABETES MELLITUS, TYPE II 08/24/2007  . ECZEMA 08/24/2007  . Edema 08/24/2007  . GERD 08/24/2007   not current (07/2014)  . Heart murmur   . HYPERCHOLESTEROLEMIA 08/24/2007  . HYPERLIPIDEMIA 08/24/2007  . HYPERTENSION 08/24/2007  . OBESITY 08/24/2007  . 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 Personal history of radiation therapy 2017  . POSTMENOPAUSAL STATUS 08/24/2007  . 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 SPINAL STENOSIS 08/24/2007    Tobacco Use: History  Smoking Status  . Former Smoker  Smokeless Tobacco  . Never Used    Comment: quit in 1070    Labs: Recent Review Flowsheet Data    Labs for ITP Cardiac and Pulmonary Rehab Latest Ref Rng & Units 09/24/2016 09/24/2016 09/24/2016 09/24/2016 09/24/2016   Cholestrol 0 - 200 mg/dL - - - - -   LDLCALC 0 - 99 mg/dL - - - - -   HDL >39.00 mg/dL - - - - -   Trlycerides 0.0 - 149.0 mg/dL - - - - -   Hemoglobin A1c 4.8 - 5.6 % - - - - -   PHART 7.350 - 7.450 7.529(H) - - 7.411 7.399   PCO2ART 32.0 - 48.0 mmHg 34.9 - - 39.0 41.3   HCO3 20.0 - 28.0 mmol/L 29.1(H) - - 24.8 25.7   TCO2 0 - 100 mmol/L 30 25 26 26 27    ACIDBASEDEF 0.0 - 2.0 mmol/L - - - - -   O2SAT % 100.0 - - 99.0 99.0      Capillary Blood Glucose: Lab Results  Component Value Date   GLUCAP 103 (H) 12/06/2016   GLUCAP 81 11/27/2016   GLUCAP 74 11/15/2016   GLUCAP 121 (H) 11/15/2016   GLUCAP 71 11/13/2016     Exercise Target Goals:    Exercise Program Goal: Individual exercise prescription set with THRR, safety & activity barriers. Participant demonstrates ability to understand and report RPE using BORG scale, to self-measure pulse accurately, and to acknowledge the importance of the exercise prescription.  Exercise Prescription Goal: Starting with aerobic activity 30 plus minutes a day, 3 days per week for initial exercise prescription. Provide home exercise prescription and guidelines that participant acknowledges understanding prior to discharge.  Activity Barriers & Risk Stratification:     Activity Barriers & Cardiac Risk Stratification - 10/31/16 0742      Activity Barriers & Cardiac Risk Stratification   Activity Barriers Assistive Device   Cardiac Risk Stratification High      6 Minute Walk:      6 Minute Walk    Row Name 10/31/16 1013         6 Minute Walk   Phase Initial     Distance 2 feet     Walk Time 6 minutes     # of Rest Breaks 0     MPH 2     METS 2.2     RPE 13     VO2 Peak 7.7     Symptoms No     Resting HR 70 bpm     Resting BP 138/84     Max Ex. HR 116 bpm     Max Ex. BP 178/90     2  Minute Post BP 118/60        Oxygen Initial Assessment:   Oxygen Re-Evaluation:   Oxygen Discharge (Final Oxygen Re-Evaluation):   Initial Exercise Prescription:     Initial Exercise Prescription - 10/31/16 1000      Date of Initial Exercise RX and Referring Provider   Date 10/31/16   Referring Provider Sherren Mocha MD     Recumbant Bike   Level 1   Minutes 10   METs 2.2     NuStep   Level 1   SPM 80   Minutes 10   METs 2     Track   Laps 8   Minutes 10   METs 2.4     Prescription Details   Frequency (times per week) 3   Duration Progress to 45 minutes of aerobic exercise without signs/symptoms of physical distress     Intensity   THRR 40-80% of Max Heartrate 60-119   Ratings of Perceived Exertion 11-13   Perceived Dyspnea 0-4     Progression   Progression Continue to progress workloads to maintain intensity without signs/symptoms of physical distress.     Resistance Training   Training Prescription Yes   Weight 2   Reps 10-15      Perform Capillary Blood Glucose checks as needed.  Exercise Prescription Changes:      Exercise Prescription Changes    Row Name 11/11/16 1400 11/22/16 1647 12/10/16 1600         Response to Exercise   Blood Pressure (Admit) 142/88 142/82 132/78     Blood Pressure (Exercise) 188/84 150/80 136/72     Blood Pressure (Exit) 140/84 124/80 140/80     Heart Rate (Admit) 72 bpm 93 bpm 80 bpm     Heart Rate (Exercise) 120 bpm 119 bpm 111 bpm     Heart Rate (Exit) 75 bpm 89 bpm 72 bpm     Rating of Perceived Exertion (Exercise) 14 12 12      Symptoms none none none     Comments pt responded well to  exercise session and was oriented to exercise equipment on 11/06/16  -  -     Duration Continue with 30 min of aerobic exercise without signs/symptoms of physical distress. Continue with 30 min of aerobic exercise without signs/symptoms of physical distress. Continue with 30 min of aerobic exercise without signs/symptoms of physical distress.     Intensity THRR unchanged THRR unchanged THRR unchanged       Progression   Progression Continue to progress workloads to maintain intensity without signs/symptoms of physical distress. Continue to progress workloads to maintain intensity without signs/symptoms of physical distress. Continue to progress workloads to maintain intensity without signs/symptoms of physical distress.     Average METs 2.4 2.4 2.8       Resistance Training   Training Prescription Yes Yes Yes     Weight 2lbs 4lbs 4lbs     Reps 10-15 10-15 10-15     Time 10 Minutes 10 Minutes 10 Minutes       NuStep   Level 1 3 3      SPM 80 80 80     Minutes 10 10 10      METs 1.8 2.4 2.8       Arm Ergometer   Level 1 1 1      Watts 44 44 44     Minutes 10 10 10      METs 3.42 3.42 3.42       Track  Laps 6 7 7      Minutes 10 10 10      METs 2.04 2.2 2.2       Home Exercise Plan   Plans to continue exercise at  - Home (comment)  walking and riding stationary bike Home (comment)  walking and riding stationary bike     Frequency  - Add 2 additional days to program exercise sessions. Add 2 additional days to program exercise sessions.     Initial Home Exercises Provided  - 11/18/16 11/18/16        Exercise Comments:      Exercise Comments    Row Name 11/06/16 1413 12/10/16 1652         Exercise Comments Pt was oriented to exercise equipment on 11/06/16. Pt did well with exercise session, and reported no signs/symptoms of CP or unusual SOB. Reviewed METs and goals. Pt is tolerating moderate intensity exercise very well; will continue to monitor pt's progress and activity levels.          Exercise Goals and Review:      Exercise Goals    Row Name 10/31/16 0743 10/31/16 1016           Exercise Goals   Increase Physical Activity (P)  Yes Yes      Intervention (P)  Provide advice, education, support and counseling about physical activity/exercise needs.;Develop an individualized exercise prescription for aerobic and resistive training based on initial evaluation findings, risk stratification, comorbidities and participant's personal goals. Provide advice, education, support and counseling about physical activity/exercise needs.;Develop an individualized exercise prescription for aerobic and resistive training based on initial evaluation findings, risk stratification, comorbidities and participant's personal goals.      Expected Outcomes (P)  Achievement of increased cardiorespiratory fitness and enhanced flexibility, muscular endurance and strength shown through measurements of functional capacity and personal statement of participant. Achievement of increased cardiorespiratory fitness and enhanced flexibility, muscular endurance and strength shown through measurements of functional capacity and personal statement of participant.      Increase Strength and Stamina (P)  Yes Yes      Intervention (P)  Provide advice, education, support and counseling about physical activity/exercise needs.;Develop an individualized exercise prescription for aerobic and resistive training based on initial evaluation findings, risk stratification, comorbidities and participant's personal goals. Provide advice, education, support and counseling about physical activity/exercise needs.;Develop an individualized exercise prescription for aerobic and resistive training based on initial evaluation findings, risk stratification, comorbidities and participant's personal goals.      Expected Outcomes (P)  Achievement of increased cardiorespiratory fitness and enhanced flexibility, muscular endurance and  strength shown through measurements of functional capacity and personal statement of participant. Achievement of increased cardiorespiratory fitness and enhanced flexibility, muscular endurance and strength shown through measurements of functional capacity and personal statement of participant.         Exercise Goals Re-Evaluation :     Exercise Goals Re-Evaluation    Row Name 11/19/16 1041 12/10/16 1653           Exercise Goal Re-Evaluation   Exercise Goals Review Increase Physical Activity;Increase Strenth and Stamina Increase Physical Activity;Increase Strenth and Stamina      Comments Reviewed home exercise with pt today.  Pt plans to walk and ride stationary bike for exercise.Discussed exercise progression and bouts of exercise to build for a total time of 20-30 minutes. Reviewed THR, pulse, RPE, sign and symptoms, and when to call 911 or MD.  Also discussed weather considerations and indoor options.  Pt voiced understanding. Pt walking tolerance has improved pt is up to walking 12 laps in cardiac rehab with very little rest breaks.      Expected Outcomes Pt will build on aeorbic capacity and be compliant with home exercise program. Pt will build on aeorbic capacity and continue to improve in walking tolerance          Discharge Exercise Prescription (Final Exercise Prescription Changes):     Exercise Prescription Changes - 12/10/16 1600      Response to Exercise   Blood Pressure (Admit) 132/78   Blood Pressure (Exercise) 136/72   Blood Pressure (Exit) 140/80   Heart Rate (Admit) 80 bpm   Heart Rate (Exercise) 111 bpm   Heart Rate (Exit) 72 bpm   Rating of Perceived Exertion (Exercise) 12   Symptoms none   Duration Continue with 30 min of aerobic exercise without signs/symptoms of physical distress.   Intensity THRR unchanged     Progression   Progression Continue to progress workloads to maintain intensity without signs/symptoms of physical distress.   Average METs 2.8      Resistance Training   Training Prescription Yes   Weight 4lbs   Reps 10-15   Time 10 Minutes     NuStep   Level 3   SPM 80   Minutes 10   METs 2.8     Arm Ergometer   Level 1   Watts 44   Minutes 10   METs 3.42     Track   Laps 7   Minutes 10   METs 2.2     Home Exercise Plan   Plans to continue exercise at Home (comment)  walking and riding stationary bike   Frequency Add 2 additional days to program exercise sessions.   Initial Home Exercises Provided 11/18/16      Nutrition:  Target Goals: Understanding of nutrition guidelines, daily intake of sodium 1500mg , cholesterol 200mg , calories 30% from fat and 7% or less from saturated fats, daily to have 5 or more servings of fruits and vegetables.  Biometrics:     Pre Biometrics - 10/31/16 1017      Pre Biometrics   Waist Circumference 41.5 inches   Hip Circumference 50 inches   Waist to Hip Ratio 0.83 %   Triceps Skinfold 41 mm   % Body Fat 52.1 %   Grip Strength 30 kg   Flexibility 9 in   Single Leg Stand 3.2 seconds       Nutrition Therapy Plan and Nutrition Goals:     Nutrition Therapy & Goals - 11/01/16 0746      Nutrition Therapy   Diet Carb Modified, Therapeutic Lifestyle Changes     Personal Nutrition Goals   Nutrition Goal Wt loss of 1-2 lb/week to a wt loss goal of 6-24 lb at graduation from [facility]. Pt long-term wt loss goal wt is 180 lb.      Intervention Plan   Intervention Prescribe, educate and counsel regarding individualized specific dietary modifications aiming towards targeted core components such as weight, hypertension, lipid management, diabetes, heart failure and other comorbidities.   Expected Outcomes Short Term Goal: Understand basic principles of dietary content, such as calories, fat, sodium, cholesterol and nutrients.;Long Term Goal: Adherence to prescribed nutrition plan.      Nutrition Discharge: Nutrition Scores:     Nutrition Assessments - 11/01/16  0746      MEDFICTS Scores   Pre Score 30      Nutrition Goals  Re-Evaluation:   Nutrition Goals Re-Evaluation:   Nutrition Goals Discharge (Final Nutrition Goals Re-Evaluation):   Psychosocial: Target Goals: Acknowledge presence or absence of significant depression and/or stress, maximize coping skills, provide positive support system. Participant is able to verbalize types and ability to use techniques and skills needed for reducing stress and depression.  Initial Review & Psychosocial Screening:     Initial Psych Review & Screening - 11/10/16 2049      Initial Review   Current issues with None Identified     Family Dynamics   Good Support System? Yes     Barriers   Psychosocial barriers to participate in program There are no identifiable barriers or psychosocial needs.     Screening Interventions   Interventions Encouraged to exercise      Quality of Life Scores:     Quality of Life - 10/31/16 1021      Quality of Life Scores   Health/Function Pre 25.08 %   Socioeconomic Pre 27.07 %   Psych/Spiritual Pre 28.93 %   Family Pre 24 %   GLOBAL Pre 26.26 %      PHQ-9: Recent Review Flowsheet Data    Depression screen [facility] 2/9 06/26/2016 02/28/2016 12/27/2015 08/23/2015 02/15/2015   Decreased Interest 0 0 0 0 0   Down, Depressed, Hopeless 0 0 0 0 0   PHQ - 2 Score 0 0 0 0 0     Interpretation of Total Score  Total Score Depression Severity:  1-4 = Minimal depression, 5-9 = Mild depression, 10-14 = Moderate depression, 15-19 = Moderately severe depression, 20-27 = Severe depression   Psychosocial Evaluation and Intervention:     Psychosocial Evaluation - 12/09/16 1344      Psychosocial Evaluation & Interventions   Interventions Relaxation education;Stress management education;Encouraged to exercise with the program and follow exercise prescription   Expected Outcomes Pt continue to display positive and healthy coping skills.   Continue Psychosocial Services   No Follow up required      Psychosocial Re-Evaluation:     Psychosocial Re-Evaluation    [place] Name 11/06/16 1457 11/10/16 2049 12/09/16 1347         Psychosocial Re-Evaluation   Current issues with None Identified None Identified None Identified     Interventions Encouraged to attend Cardiac Rehabilitation for the exercise;Stress management education Encouraged to attend Cardiac Rehabilitation for the exercise;Stress management education;Relaxation education Encouraged to attend Cardiac Rehabilitation for the exercise;Stress management education;Relaxation education     Continue Psychosocial Services  Follow up required by staff Follow up required by staff No Follow up required        Psychosocial Discharge (Final Psychosocial Re-Evaluation):     Psychosocial Re-Evaluation - 12/09/16 1347      Psychosocial Re-Evaluation   Current issues with None Identified   Interventions Encouraged to attend Cardiac Rehabilitation for the exercise;Stress management education;Relaxation education   Continue Psychosocial Services  No Follow up required      Vocational Rehabilitation: Provide vocational rehab assistance to qualifying candidates.   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 Participant will state understanding/return demonstration of topics presented.  Learning Barriers/Preferences:     Learning Barriers/Preferences -  10/31/16 0741      Learning Barriers/Preferences   Learning Preferences Skilled Demonstration;Written Material;Verbal Instruction      Education Topics: Count Your Pulse:  -Group instruction provided by verbal instruction, demonstration,  patient participation and written materials to support subject.  Instructors address importance of being able to find your pulse and how to count your pulse when at home without a heart monitor.  Patients get hands on experience counting their pulse with staff help and individually.   Heart Attack, Angina, and Risk Factor Modification:  -Group instruction provided by verbal instruction, video, and written materials to support subject.  Instructors address signs and symptoms of angina and heart attacks.    Also discuss risk factors for heart disease and how to make changes to improve heart health risk factors.   Functional Fitness:  -Group instruction provided by verbal instruction, demonstration, patient participation, and written materials to support subject.  Instructors address safety measures for doing things around the house.  Discuss how to get up and down off the floor, how to pick things up properly, how to safely get out of a chair without assistance, and balance training.   CARDIAC REHAB PHASE II EXERCISE from 12/06/2016 in [place]  Date  11/15/16  Instruction Review Code  2- meets goals/outcomes      Meditation and Mindfulness:  -Group instruction provided by verbal instruction, patient participation, and written materials to support subject.  Instructor addresses importance of mindfulness and meditation practice to help reduce stress and improve awareness.  Instructor also leads participants through a meditation exercise.    Stretching for Flexibility and Mobility:  -Group instruction provided by verbal instruction, patient participation, and written materials to support subject.  Instructors lead participants through series of stretches that are designed to increase flexibility thus improving mobility.  These stretches are additional exercise for major muscle groups that are typically performed during regular warm up and cool down.   CARDIAC REHAB  PHASE II EXERCISE from 12/06/2016 in [place]  Date  11/22/16  Instruction Review Code  2- meets goals/outcomes      Hands Only CPR:  -Group verbal, video, and participation provides a basic overview of AHA guidelines for community CPR. Role-play of emergencies allow participants the opportunity to practice calling for help and chest compression technique with discussion of AED use.   Hypertension: -Group verbal and written instruction that provides a basic overview of hypertension including the most recent diagnostic guidelines, risk factor reduction with self-care instructions and medication management.   CARDIAC REHAB PHASE II EXERCISE from 12/06/2016 in [place]  Date  12/06/16  Instruction Review Code  2- meets goals/outcomes       Nutrition I class: Heart Healthy Eating:  -Group instruction provided by PowerPoint slides, verbal discussion, and written materials to support subject matter. The instructor gives an explanation and review of the Therapeutic Lifestyle Changes diet recommendations, which includes a discussion on lipid goals, dietary fat, sodium, fiber, plant stanol/sterol esters, sugar, and the components of a well-balanced, healthy diet.   Nutrition II class: Lifestyle Skills:  -Group instruction provided by PowerPoint slides, verbal discussion, and written materials to support subject matter. The instructor gives an explanation and review of label reading, grocery shopping for heart health, heart healthy recipe modifications, and ways to make healthier choices when eating out.   Diabetes Question & Answer:  -Group instruction provided by PowerPoint slides, verbal discussion, and written  materials to support subject matter. The instructor gives an explanation and review of diabetes co-morbidities, pre- and post-prandial blood glucose goals, pre-exercise blood glucose goals, signs, symptoms, and treatment of  hypoglycemia and hyperglycemia, and foot care basics.   CARDIAC REHAB PHASE II EXERCISE from 12/06/2016 in [place]  Date  11/29/16  Educator  RD  Instruction Review Code  2- meets goals/outcomes      Diabetes Blitz:  -Group instruction provided by PowerPoint slides, verbal discussion, and written materials to support subject matter. The instructor gives an explanation and review of the physiology behind type 1 and type 2 diabetes, diabetes medications and rational behind using different medications, pre- and post-prandial blood glucose recommendations and Hemoglobin A1c goals, diabetes diet, and exercise including blood glucose guidelines for exercising safely.    Portion Distortion:  -Group instruction provided by PowerPoint slides, verbal discussion, written materials, and food models to support subject matter. The instructor gives an explanation of serving size versus portion size, changes in portions sizes over the last 20 years, and what consists of a serving from each food group.   Stress Management:  -Group instruction provided by verbal instruction, video, and written materials to support subject matter.  Instructors review role of stress in heart disease and how to cope with stress positively.     Exercising on Your Own:  -Group instruction provided by verbal instruction, power point, and written materials to support subject.  Instructors discuss benefits of exercise, components of exercise, frequency and intensity of exercise, and end points for exercise.  Also discuss use of nitroglycerin and activating EMS.  Review options of places to exercise outside of rehab.  Review guidelines for sex with heart disease.   CARDIAC REHAB PHASE II EXERCISE from 12/06/2016 in [place]  Date  11/13/16  Instruction Review Code  2- meets goals/outcomes      Cardiac Drugs I:  -Group instruction provided by verbal instruction and  written materials to support subject.  Instructor reviews cardiac drug classes: antiplatelets, anticoagulants, beta blockers, and statins.  Instructor discusses reasons, side effects, and lifestyle considerations for each drug class.   CARDIAC REHAB PHASE II EXERCISE from 12/06/2016 in [place]  Date  12/04/16  Instruction Review Code  2- meets goals/outcomes      Cardiac Drugs II:  -Group instruction provided by verbal instruction and written materials to support subject.  Instructor reviews cardiac drug classes: angiotensin converting enzyme inhibitors (ACE-I), angiotensin II receptor blockers (ARBs), nitrates, and calcium channel blockers.  Instructor discusses reasons, side effects, and lifestyle considerations for each drug class.   Anatomy and Physiology of the Circulatory System:  Group verbal and written instruction and models provide basic cardiac anatomy and physiology, with the coronary electrical and arterial systems. Review of: AMI, Angina, Valve disease, Heart Failure, Peripheral Artery Disease, Cardiac Arrhythmia, Pacemakers, and the ICD.   CARDIAC REHAB PHASE II EXERCISE from 12/06/2016 in [place]  Date  11/27/16  Educator  RN  Instruction Review Code  2- meets goals/outcomes      Other Education:  -Group or individual verbal, written, or video instructions that support the educational goals of the cardiac rehab program.   Knowledge Questionnaire Score:     Knowledge Questionnaire Score - 10/31/16 0736      Knowledge Questionnaire Score   Pre Score 22/24      Core Components/Risk Factors/Patient Goals at Admission:  Personal Goals and Risk Factors at Admission - 10/31/16 1142      Core Components/Risk Factors/Patient Goals on Admission   Diabetes Yes      Core Components/Risk Factors/Patient Goals Review:      Goals and Risk Factor Review    Row Name 11/10/16 2047 12/09/16 1342            Core Components/Risk Factors/Patient Goals Review   Personal Goals Review Weight Management/Obesity;Lipids;Other;Hypertension;Diabetes Weight Management/Obesity;Lipids;Other;Hypertension;Diabetes      Review Pt is off to a good start toward reduction of modifiable risk factors Pt is off to a good start toward reduction of modifiable risk factors. 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      Expected Outcomes  - Pt will display and/or make desired progress toward the desired weight loss, lipid panel readings within normal limtis, Maintenance of HGA1C less than 6.0.         Core Components/Risk Factors/Patient Goals at Discharge (Final Review):      Goals and Risk Factor Review - 12/09/16 1342      Core Components/Risk Factors/Patient Goals Review   Personal Goals Review Weight Management/Obesity;Lipids;Other;Hypertension;Diabetes   Review Pt is off to a good start toward reduction of modifiable risk factors. 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   Expected Outcomes Pt will display and/or make desired progress toward the desired weight loss, lipid panel readings within normal limtis, Maintenance of HGA1C less than 6.0.      ITP Comments:     ITP Comments    Row Name 10/31/16 0735           ITP Comments Dr. Fransico Him, Medical Director          Comments:  Carolyne Littles is making expected progress toward personal goals after completing  14 sessions. Psychosocial assessment - Pt exhibits positive coping skills, hopeful outlook with supportive family. No psychosocial needs identified at this time, no psychosocial interventions necessary.  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 Pt has returned back to work Monday through Thursday night shift.  Recommend continued exercise and life style modification education including  stress management and relaxation techniques to decrease cardiac risk  profile.Cherre Huger, BSN Cardiac and Training and development officer

## 2016-12-11 ENCOUNTER — Encounter (HOSPITAL_COMMUNITY)
Admission: RE | Admit: 2016-12-11 | Discharge: 2016-12-11 | Disposition: A | Discharge status: Home / Self Care | Payer: Medicare Other | Source: Ambulatory Visit | Attending: Cardiovascular Disease | Admitting: Cardiovascular Disease

## 2016-12-11 DIAGNOSIS — Z7984 Long term (current) use of oral hypoglycemic drugs: Secondary | ICD-10-CM | POA: Diagnosis not present

## 2016-12-11 DIAGNOSIS — Z952 Presence of prosthetic heart valve: Secondary | ICD-10-CM

## 2016-12-11 DIAGNOSIS — Z7902 Long term (current) use of antithrombotics/antiplatelets: Secondary | ICD-10-CM | POA: Diagnosis not present

## 2016-12-11 DIAGNOSIS — Z7982 Long term (current) use of aspirin: Secondary | ICD-10-CM | POA: Diagnosis not present

## 2016-12-11 DIAGNOSIS — Z79899 Other long term (current) drug therapy: Secondary | ICD-10-CM | POA: Diagnosis not present

## 2016-12-11 DIAGNOSIS — Z7951 Long term (current) use of inhaled steroids: Secondary | ICD-10-CM | POA: Diagnosis not present

## 2016-12-13 ENCOUNTER — Encounter (HOSPITAL_COMMUNITY)
Admission: RE | Admit: 2016-12-13 | Discharge: 2016-12-13 | Disposition: A | Discharge status: Home / Self Care | Payer: Medicare Other | Source: Ambulatory Visit | Attending: Cardiovascular Disease | Admitting: Cardiovascular Disease

## 2016-12-13 DIAGNOSIS — Z952 Presence of prosthetic heart valve: Secondary | ICD-10-CM

## 2016-12-13 DIAGNOSIS — Z7984 Long term (current) use of oral hypoglycemic drugs: Secondary | ICD-10-CM | POA: Diagnosis not present

## 2016-12-13 DIAGNOSIS — Z7902 Long term (current) use of antithrombotics/antiplatelets: Secondary | ICD-10-CM | POA: Diagnosis not present

## 2016-12-13 DIAGNOSIS — Z7951 Long term (current) use of inhaled steroids: Secondary | ICD-10-CM | POA: Diagnosis not present

## 2016-12-13 DIAGNOSIS — Z7982 Long term (current) use of aspirin: Secondary | ICD-10-CM | POA: Diagnosis not present

## 2016-12-13 DIAGNOSIS — Z79899 Other long term (current) drug therapy: Secondary | ICD-10-CM | POA: Diagnosis not present

## 2016-12-16 ENCOUNTER — Encounter (HOSPITAL_COMMUNITY)
Admission: RE | Admit: 2016-12-16 | Discharge: 2016-12-16 | Disposition: A | Discharge status: Home / Self Care | Payer: Medicare Other | Source: Ambulatory Visit | Attending: Cardiovascular Disease | Admitting: Cardiovascular Disease

## 2016-12-16 DIAGNOSIS — Z7902 Long term (current) use of antithrombotics/antiplatelets: Secondary | ICD-10-CM | POA: Diagnosis not present

## 2016-12-16 DIAGNOSIS — Z7984 Long term (current) use of oral hypoglycemic drugs: Secondary | ICD-10-CM | POA: Diagnosis not present

## 2016-12-16 DIAGNOSIS — Z79899 Other long term (current) drug therapy: Secondary | ICD-10-CM | POA: Diagnosis not present

## 2016-12-16 DIAGNOSIS — Z7982 Long term (current) use of aspirin: Secondary | ICD-10-CM | POA: Diagnosis not present

## 2016-12-16 DIAGNOSIS — Z7951 Long term (current) use of inhaled steroids: Secondary | ICD-10-CM | POA: Diagnosis not present

## 2016-12-16 DIAGNOSIS — Z952 Presence of prosthetic heart valve: Secondary | ICD-10-CM | POA: Diagnosis not present

## 2016-12-18 ENCOUNTER — Encounter (HOSPITAL_COMMUNITY)
Admission: RE | Admit: 2016-12-18 | Discharge: 2016-12-18 | Disposition: A | Discharge status: Home / Self Care | Payer: Medicare Other | Source: Ambulatory Visit | Attending: Cardiovascular Disease | Admitting: Cardiovascular Disease

## 2016-12-18 ENCOUNTER — Other Ambulatory Visit: Payer: Self-pay | Admitting: Internal Medicine

## 2016-12-18 DIAGNOSIS — Z79899 Other long term (current) drug therapy: Secondary | ICD-10-CM | POA: Diagnosis not present

## 2016-12-18 DIAGNOSIS — Z7984 Long term (current) use of oral hypoglycemic drugs: Secondary | ICD-10-CM | POA: Diagnosis not present

## 2016-12-18 DIAGNOSIS — Z7902 Long term (current) use of antithrombotics/antiplatelets: Secondary | ICD-10-CM | POA: Diagnosis not present

## 2016-12-18 DIAGNOSIS — Z7982 Long term (current) use of aspirin: Secondary | ICD-10-CM | POA: Diagnosis not present

## 2016-12-18 DIAGNOSIS — Z952 Presence of prosthetic heart valve: Secondary | ICD-10-CM | POA: Diagnosis not present

## 2016-12-18 DIAGNOSIS — Z7951 Long term (current) use of inhaled steroids: Secondary | ICD-10-CM | POA: Diagnosis not present

## 2016-12-19 ENCOUNTER — Encounter: Payer: Self-pay | Admitting: Cardiovascular Disease

## 2016-12-19 NOTE — Addendum Note (Signed)
Addendum  created 12/19/16 1432 by Roberts Gaudy, MD   Sign clinical note

## 2016-12-20 ENCOUNTER — Encounter: Payer: Self-pay | Admitting: Internal Medicine

## 2016-12-20 ENCOUNTER — Encounter (HOSPITAL_COMMUNITY)
Admission: RE | Admit: 2016-12-20 | Discharge: 2016-12-20 | Disposition: A | Discharge status: Home / Self Care | Payer: Medicare Other | Source: Ambulatory Visit | Attending: Cardiovascular Disease | Admitting: Cardiovascular Disease

## 2016-12-20 DIAGNOSIS — Z7902 Long term (current) use of antithrombotics/antiplatelets: Secondary | ICD-10-CM | POA: Diagnosis not present

## 2016-12-20 DIAGNOSIS — Z952 Presence of prosthetic heart valve: Secondary | ICD-10-CM | POA: Diagnosis not present

## 2016-12-20 DIAGNOSIS — Z7982 Long term (current) use of aspirin: Secondary | ICD-10-CM | POA: Diagnosis not present

## 2016-12-20 DIAGNOSIS — Z79899 Other long term (current) drug therapy: Secondary | ICD-10-CM | POA: Diagnosis not present

## 2016-12-20 DIAGNOSIS — Z7984 Long term (current) use of oral hypoglycemic drugs: Secondary | ICD-10-CM | POA: Diagnosis not present

## 2016-12-20 DIAGNOSIS — Z7951 Long term (current) use of inhaled steroids: Secondary | ICD-10-CM | POA: Diagnosis not present

## 2016-12-23 ENCOUNTER — Encounter (HOSPITAL_COMMUNITY)
Admission: RE | Admit: 2016-12-23 | Discharge: 2016-12-23 | Disposition: A | Discharge status: Home / Self Care | Payer: Medicare Other | Source: Ambulatory Visit | Attending: Cardiovascular Disease | Admitting: Cardiovascular Disease

## 2016-12-23 DIAGNOSIS — Z7902 Long term (current) use of antithrombotics/antiplatelets: Secondary | ICD-10-CM | POA: Diagnosis not present

## 2016-12-23 DIAGNOSIS — Z79899 Other long term (current) drug therapy: Secondary | ICD-10-CM | POA: Diagnosis not present

## 2016-12-23 DIAGNOSIS — Z952 Presence of prosthetic heart valve: Secondary | ICD-10-CM

## 2016-12-23 DIAGNOSIS — Z7951 Long term (current) use of inhaled steroids: Secondary | ICD-10-CM | POA: Diagnosis not present

## 2016-12-23 DIAGNOSIS — Z7984 Long term (current) use of oral hypoglycemic drugs: Secondary | ICD-10-CM | POA: Diagnosis not present

## 2016-12-23 DIAGNOSIS — Z7982 Long term (current) use of aspirin: Secondary | ICD-10-CM | POA: Diagnosis not present

## 2016-12-24 ENCOUNTER — Ambulatory Visit: Payer: Medicare Other | Admitting: Internal Medicine

## 2016-12-25 ENCOUNTER — Encounter (HOSPITAL_COMMUNITY)
Admission: RE | Admit: 2016-12-25 | Discharge: 2016-12-25 | Disposition: A | Discharge status: Home / Self Care | Payer: Medicare Other | Source: Ambulatory Visit | Attending: Cardiovascular Disease | Admitting: Cardiovascular Disease

## 2016-12-25 DIAGNOSIS — Z7902 Long term (current) use of antithrombotics/antiplatelets: Secondary | ICD-10-CM | POA: Diagnosis not present

## 2016-12-25 DIAGNOSIS — Z952 Presence of prosthetic heart valve: Secondary | ICD-10-CM

## 2016-12-25 DIAGNOSIS — Z7984 Long term (current) use of oral hypoglycemic drugs: Secondary | ICD-10-CM | POA: Diagnosis not present

## 2016-12-25 DIAGNOSIS — Z7951 Long term (current) use of inhaled steroids: Secondary | ICD-10-CM | POA: Diagnosis not present

## 2016-12-25 DIAGNOSIS — Z79899 Other long term (current) drug therapy: Secondary | ICD-10-CM | POA: Diagnosis not present

## 2016-12-25 DIAGNOSIS — Z7982 Long term (current) use of aspirin: Secondary | ICD-10-CM | POA: Diagnosis not present

## 2016-12-27 ENCOUNTER — Encounter (HOSPITAL_COMMUNITY)
Admission: RE | Admit: 2016-12-27 | Discharge: 2016-12-27 | Disposition: A | Discharge status: Home / Self Care | Payer: Medicare Other | Source: Ambulatory Visit | Attending: Cardiovascular Disease | Admitting: Cardiovascular Disease

## 2016-12-27 DIAGNOSIS — Z952 Presence of prosthetic heart valve: Secondary | ICD-10-CM

## 2017-01-01 ENCOUNTER — Encounter (HOSPITAL_COMMUNITY): Payer: Medicare Other

## 2017-01-03 ENCOUNTER — Encounter (HOSPITAL_COMMUNITY)
Admission: RE | Admit: 2017-01-03 | Discharge: 2017-01-03 | Disposition: A | Discharge status: Home / Self Care | Payer: Medicare Other | Source: Ambulatory Visit | Attending: Cardiovascular Disease | Admitting: Cardiovascular Disease

## 2017-01-03 DIAGNOSIS — Z7982 Long term (current) use of aspirin: Secondary | ICD-10-CM | POA: Insufficient documentation

## 2017-01-03 DIAGNOSIS — M170 Bilateral primary osteoarthritis of knee: Secondary | ICD-10-CM | POA: Insufficient documentation

## 2017-01-03 DIAGNOSIS — Z952 Presence of prosthetic heart valve: Secondary | ICD-10-CM | POA: Diagnosis not present

## 2017-01-03 DIAGNOSIS — E669 Obesity, unspecified: Secondary | ICD-10-CM | POA: Insufficient documentation

## 2017-01-03 DIAGNOSIS — M4800 Spinal stenosis, site unspecified: Secondary | ICD-10-CM | POA: Insufficient documentation

## 2017-01-03 DIAGNOSIS — E119 Type 2 diabetes mellitus without complications: Secondary | ICD-10-CM | POA: Diagnosis not present

## 2017-01-03 DIAGNOSIS — Z6841 Body Mass Index (BMI) 40.0 and over, adult: Secondary | ICD-10-CM | POA: Diagnosis not present

## 2017-01-03 DIAGNOSIS — Z7902 Long term (current) use of antithrombotics/antiplatelets: Secondary | ICD-10-CM | POA: Insufficient documentation

## 2017-01-03 DIAGNOSIS — I10 Essential (primary) hypertension: Secondary | ICD-10-CM | POA: Diagnosis not present

## 2017-01-03 DIAGNOSIS — Z853 Personal history of malignant neoplasm of breast: Secondary | ICD-10-CM | POA: Insufficient documentation

## 2017-01-03 DIAGNOSIS — Z7951 Long term (current) use of inhaled steroids: Secondary | ICD-10-CM | POA: Insufficient documentation

## 2017-01-03 DIAGNOSIS — Z7984 Long term (current) use of oral hypoglycemic drugs: Secondary | ICD-10-CM | POA: Insufficient documentation

## 2017-01-03 DIAGNOSIS — J45909 Unspecified asthma, uncomplicated: Secondary | ICD-10-CM | POA: Diagnosis not present

## 2017-01-03 DIAGNOSIS — Z87891 Personal history of nicotine dependence: Secondary | ICD-10-CM | POA: Insufficient documentation

## 2017-01-03 DIAGNOSIS — Z79899 Other long term (current) drug therapy: Secondary | ICD-10-CM | POA: Insufficient documentation

## 2017-01-03 DIAGNOSIS — E7800 Pure hypercholesterolemia, unspecified: Secondary | ICD-10-CM | POA: Diagnosis not present

## 2017-01-03 DIAGNOSIS — C50911 Malignant neoplasm of unspecified site of right female breast: Secondary | ICD-10-CM | POA: Diagnosis not present

## 2017-01-06 ENCOUNTER — Other Ambulatory Visit: Payer: Self-pay | Admitting: Internal Medicine

## 2017-01-06 ENCOUNTER — Encounter: Payer: Self-pay | Admitting: Internal Medicine

## 2017-01-06 ENCOUNTER — Encounter (HOSPITAL_COMMUNITY)
Admission: RE | Admit: 2017-01-06 | Discharge: 2017-01-06 | Disposition: A | Discharge status: Home / Self Care | Payer: Medicare Other | Source: Ambulatory Visit | Attending: Cardiovascular Disease | Admitting: Cardiovascular Disease

## 2017-01-06 DIAGNOSIS — Z79899 Other long term (current) drug therapy: Secondary | ICD-10-CM | POA: Diagnosis not present

## 2017-01-06 DIAGNOSIS — Z7951 Long term (current) use of inhaled steroids: Secondary | ICD-10-CM | POA: Diagnosis not present

## 2017-01-06 DIAGNOSIS — Z952 Presence of prosthetic heart valve: Secondary | ICD-10-CM | POA: Diagnosis not present

## 2017-01-06 DIAGNOSIS — Z7982 Long term (current) use of aspirin: Secondary | ICD-10-CM | POA: Diagnosis not present

## 2017-01-06 DIAGNOSIS — Z7984 Long term (current) use of oral hypoglycemic drugs: Secondary | ICD-10-CM | POA: Diagnosis not present

## 2017-01-06 DIAGNOSIS — Z7902 Long term (current) use of antithrombotics/antiplatelets: Secondary | ICD-10-CM | POA: Diagnosis not present

## 2017-01-06 MED ORDER — BUDESONIDE-FORMOTEROL FUMARATE 160-4.5 MCG/ACT IN AERO: 2.0000 | INHALATION_SPRAY | Freq: Two times a day (BID) | RESPIRATORY_TRACT | 1 refills | Status: DC

## 2017-01-07 NOTE — Progress Notes (Signed)
Cardiac Individual Treatment Plan  Patient Details  Name: Denise [name] MRN: [id number] Date of Birth: 08/21/1945 Referring Provider:     CARDIAC REHAB PHASE II ORIENTATION from 10/31/2016 in [place]  Referring Provider  Sherren Mocha MD      Initial Encounter Date:    CARDIAC REHAB PHASE II ORIENTATION from 10/31/2016 in [place]  Date  10/31/16  Referring Provider  Sherren Mocha MD      Visit Diagnosis: 09/24/16 S/P TAVR (transcatheter aortic valve replacement)  Patient's Home Medications on Admission:  Current Outpatient Prescriptions:  .  albuterol (PROVENTIL HFA;VENTOLIN HFA) 108 (90 Base) MCG/ACT inhaler, Inhale 2 puffs into the lungs every 6 (six) hours as needed for wheezing or shortness of breath., Disp: 1 Inhaler, Rfl: 11 .  amLODipine (NORVASC) 10 MG tablet, Take 1 tablet (10 mg total) by mouth daily., Disp: 90 tablet, Rfl: 3 .  anastrozole (ARIMIDEX) 1 MG tablet, Take 1 tablet (1 mg total) by mouth daily., Disp: 90 tablet, Rfl: 3 .  aspirin 81 MG EC tablet, Take 81 mg by mouth daily.  , Disp: , Rfl:  .  atorvastatin (LIPITOR) 40 MG tablet, TAKE ONE TABLET BY MOUTH ONCE DAILY., Disp: 90 tablet, Rfl: 3 .  budesonide-formoterol (SYMBICORT) 160-4.5 MCG/ACT inhaler, Inhale 2 puffs into the lungs 2 (two) times daily., Disp: 3 Inhaler, Rfl: 1 .  Calcium-Magnesium-Vitamin D (CALCIUM 1200+D3 PO), Take 1 tablet by mouth daily., Disp: , Rfl:  .  clopidogrel (PLAVIX) 75 MG tablet, Take 1 tablet (75 mg total) by mouth daily with breakfast., Disp: 30 tablet, Rfl: 3 .  EQ ALLERGY RELIEF, CETIRIZINE, 10 MG tablet, TAKE 1 TABLET BY MOUTH ONCE DAILY AS NEEDED FOR ALLERGIES, Disp: 90 tablet, Rfl: 0 .  fluticasone (FLONASE) 50 MCG/ACT nasal spray, Place 2 sprays into both nostrils daily., Disp: 16 g, Rfl: 5 .  furosemide (LASIX) 40 MG tablet, Take 1 tablet (40 mg total) by mouth 2 (two) times daily., Disp: 180 tablet,  Rfl: 1 .  glimepiride (AMARYL) 2 MG tablet, Take 0.5 tablets (1 mg total) by mouth daily with breakfast., Disp: 90 tablet, Rfl: 3 .  lisinopril (PRINIVIL,ZESTRIL) 20 MG tablet, Take 1 tablet (20 mg total) by mouth daily., Disp: 30 tablet, Rfl: 3 .  metFORMIN (GLUCOPHAGE-XR) 500 MG 24 hr tablet, 4 tabs mouth in the AM, Disp: 360 tablet, Rfl: 3 .  Multiple Vitamins-Minerals (ALIVE WOMENS 50+) TABS, Take 1 tablet by mouth daily., Disp: , Rfl:  .  Omega-3 Fatty Acids (FISH OIL) 1000 MG CAPS, Take 1,000 mg by mouth daily., Disp: , Rfl:  .  Polyethyl Glycol-Propyl Glycol (SYSTANE ULTRA OP), Apply 1 drop to eye daily as needed (dry eyes)., Disp: , Rfl:  .  potassium chloride (MICRO-K) 10 MEQ CR capsule, TAKE FOUR CAPSULES BY MOUTH ONCE DAILY, Disp: 360 capsule, Rfl: 3 .  tiZANidine (ZANAFLEX) 4 MG tablet, Take 4 mg by mouth 2 (two) times daily as needed for muscle spasms., Disp: , Rfl:  .  traMADol (ULTRAM) 50 MG tablet, Take 1-2 tablets (50-100 mg total) by mouth every 4 (four) hours as needed for moderate pain. (Patient not taking: Reported on 10/31/2016), Disp: 30 tablet, Rfl: 0  Past Medical History: Past Medical History:  Diagnosis Date  . Abdominal pain, left lower quadrant 09/12/2008  . ALLERGIC RHINITIS 08/24/2007  . ANXIETY 08/24/2007  . Aortic stenosis, severe 07/19/2016  . ASTHMA 08/24/2007  . ASTHMA, WITH ACUTE EXACERBATION 03/14/2008  .  Breast cancer ([place])   . DDD (degenerative disc disease), lumbar   . DEGENERATIVE JOINT DISEASE 08/24/2007  . DEPRESSION 08/24/2007  . DIABETES MELLITUS, TYPE II 08/24/2007  . ECZEMA 08/24/2007  . Edema 08/24/2007  . GERD 08/24/2007   not current (07/2014)  . Heart murmur   . HYPERCHOLESTEROLEMIA 08/24/2007  . HYPERLIPIDEMIA 08/24/2007  . HYPERTENSION 08/24/2007  . OBESITY 08/24/2007  . 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 Personal history of radiation therapy 2017  . POSTMENOPAUSAL STATUS 08/24/2007  . 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 SPINAL STENOSIS 08/24/2007    Tobacco Use: History  Smoking Status  . Former Smoker  Smokeless Tobacco  . Never Used    Comment: quit in 1070    Labs: Recent Review Flowsheet Data    Labs for ITP Cardiac and Pulmonary Rehab Latest Ref Rng & Units 09/24/2016 09/24/2016 09/24/2016 09/24/2016 09/24/2016   Cholestrol 0 - 200 mg/dL - - - - -   LDLCALC 0 - 99 mg/dL - - - - -   HDL >39.00 mg/dL - - - - -   Trlycerides 0.0 - 149.0 mg/dL - - - - -   Hemoglobin A1c 4.8 - 5.6 % - - - - -   PHART 7.350 - 7.450 7.529(H) - - 7.411 7.399   PCO2ART 32.0 - 48.0 mmHg 34.9 - - 39.0 41.3   HCO3 20.0 - 28.0 mmol/L 29.1(H) - - 24.8 25.7   TCO2 0 - 100 mmol/L 30 25 26 26 27    ACIDBASEDEF 0.0 - 2.0 mmol/L - - - - -   O2SAT % 100.0 - - 99.0 99.0      Capillary Blood Glucose: Lab Results  Component Value Date   GLUCAP 103 (H) 12/06/2016   GLUCAP 81 11/27/2016   GLUCAP 74 11/15/2016   GLUCAP 121 (H) 11/15/2016   GLUCAP 71 11/13/2016     Exercise Target Goals:    Exercise Program Goal: Individual exercise prescription set with THRR, safety & activity barriers. Participant demonstrates ability to understand and report RPE using BORG scale, to self-measure pulse accurately, and to acknowledge the importance of the exercise prescription.  Exercise Prescription Goal: Starting with aerobic activity 30 plus minutes a day, 3 days per week for initial exercise prescription. Provide home exercise prescription and guidelines that participant acknowledges understanding prior to discharge.  Activity Barriers & Risk Stratification:     Activity Barriers & Cardiac Risk Stratification - 10/31/16 0742      Activity Barriers & Cardiac Risk Stratification   Activity Barriers Assistive Device   Cardiac Risk Stratification High      6 Minute Walk:      6 Minute Walk    Row Name 10/31/16 1013         6 Minute Walk   Phase Initial     Distance 2 feet     Walk Time 6 minutes     # of Rest Breaks 0     MPH 2     METS 2.2     RPE 13     VO2 Peak 7.7     Symptoms No     Resting HR 70 bpm     Resting BP 138/84     Max Ex. HR 116 bpm     Max Ex. BP 178/90     2  Minute Post BP 118/60        Oxygen Initial Assessment:   Oxygen Re-Evaluation:   Oxygen Discharge (Final Oxygen Re-Evaluation):   Initial Exercise Prescription:     Initial Exercise Prescription - 10/31/16 1000      Date of Initial Exercise RX and Referring Provider   Date 10/31/16   Referring Provider Sherren Mocha MD     Recumbant Bike   Level 1   Minutes 10   METs 2.2     NuStep   Level 1   SPM 80   Minutes 10   METs 2     Track   Laps 8   Minutes 10   METs 2.4     Prescription Details   Frequency (times per week) 3   Duration Progress to 45 minutes of aerobic exercise without signs/symptoms of physical distress     Intensity   THRR 40-80% of Max Heartrate 60-119   Ratings of Perceived Exertion 11-13   Perceived Dyspnea 0-4     Progression   Progression Continue to progress workloads to maintain intensity without signs/symptoms of physical distress.     Resistance Training   Training Prescription Yes   Weight 2   Reps 10-15      Perform Capillary Blood Glucose checks as needed.  Exercise Prescription Changes:     Exercise Prescription Changes    Row Name 11/11/16 1400 11/22/16 1647 12/10/16 1600         Response to Exercise   Blood Pressure (Admit) 142/88 142/82 132/78     Blood Pressure (Exercise) 188/84 150/80 136/72     Blood Pressure (Exit) 140/84 124/80 140/80     Heart Rate (Admit) 72 bpm 93 bpm 80 bpm     Heart Rate (Exercise) 120 bpm 119 bpm 111 bpm     Heart Rate (Exit) 75 bpm 89 bpm 72 bpm     Rating of Perceived Exertion (Exercise) 14 12 12      Symptoms none none none     Comments pt responded well to  exercise session and was oriented to exercise equipment on 11/06/16  -  -     Duration Continue with 30 min of aerobic exercise without signs/symptoms of physical distress. Continue with 30 min of aerobic exercise without signs/symptoms of physical distress. Continue with 30 min of aerobic exercise without signs/symptoms of physical distress.     Intensity THRR unchanged THRR unchanged THRR unchanged       Progression   Progression Continue to progress workloads to maintain intensity without signs/symptoms of physical distress. Continue to progress workloads to maintain intensity without signs/symptoms of physical distress. Continue to progress workloads to maintain intensity without signs/symptoms of physical distress.     Average METs 2.4 2.4 2.8       Resistance Training   Training Prescription Yes Yes Yes     Weight 2lbs 4lbs 4lbs     Reps 10-15 10-15 10-15     Time 10 Minutes 10 Minutes 10 Minutes       NuStep   Level 1 3 3      SPM 80 80 80     Minutes 10 10 10      METs 1.8 2.4 2.8       Arm Ergometer   Level 1 1 1      Watts 44 44 44     Minutes 10 10 10      METs 3.42 3.42 3.42       Track  Laps 6 7 7      Minutes 10 10 10      METs 2.04 2.2 2.2       Home Exercise Plan   Plans to continue exercise at  - Home (comment)  walking and riding stationary bike Home (comment)  walking and riding stationary bike     Frequency  - Add 2 additional days to program exercise sessions. Add 2 additional days to program exercise sessions.     Initial Home Exercises Provided  - 11/18/16 11/18/16        Exercise Comments:     Exercise Comments    Row Name 11/06/16 1413 12/10/16 1652         Exercise Comments Pt was oriented to exercise equipment on 11/06/16. Pt did well with exercise session, and reported no signs/symptoms of CP or unusual SOB. Reviewed METs and goals. Pt is tolerating moderate intensity exercise very well; will continue to monitor pt's progress and activity levels.          Exercise Goals and Review:     Exercise Goals    Row Name 10/31/16 0743 10/31/16 1016           Exercise Goals   Increase Physical Activity (P)  Yes Yes      Intervention (P)  Provide advice, education, support and counseling about physical activity/exercise needs.;Develop an individualized exercise prescription for aerobic and resistive training based on initial evaluation findings, risk stratification, comorbidities and participant's personal goals. Provide advice, education, support and counseling about physical activity/exercise needs.;Develop an individualized exercise prescription for aerobic and resistive training based on initial evaluation findings, risk stratification, comorbidities and participant's personal goals.      Expected Outcomes (P)  Achievement of increased cardiorespiratory fitness and enhanced flexibility, muscular endurance and strength shown through measurements of functional capacity and personal statement of participant. Achievement of increased cardiorespiratory fitness and enhanced flexibility, muscular endurance and strength shown through measurements of functional capacity and personal statement of participant.      Increase Strength and Stamina (P)  Yes Yes      Intervention (P)  Provide advice, education, support and counseling about physical activity/exercise needs.;Develop an individualized exercise prescription for aerobic and resistive training based on initial evaluation findings, risk stratification, comorbidities and participant's personal goals. Provide advice, education, support and counseling about physical activity/exercise needs.;Develop an individualized exercise prescription for aerobic and resistive training based on initial evaluation findings, risk stratification, comorbidities and participant's personal goals.      Expected Outcomes (P)  Achievement of increased cardiorespiratory fitness and enhanced flexibility, muscular endurance and  strength shown through measurements of functional capacity and personal statement of participant. Achievement of increased cardiorespiratory fitness and enhanced flexibility, muscular endurance and strength shown through measurements of functional capacity and personal statement of participant.         Exercise Goals Re-Evaluation :     Exercise Goals Re-Evaluation    Row Name 11/19/16 1041 12/10/16 1653           Exercise Goal Re-Evaluation   Exercise Goals Review Increase Physical Activity;Increase Strenth and Stamina Increase Physical Activity;Increase Strenth and Stamina      Comments Reviewed home exercise with pt today.  Pt plans to walk and ride stationary bike for exercise.Discussed exercise progression and bouts of exercise to build for a total time of 20-30 minutes. Reviewed THR, pulse, RPE, sign and symptoms, and when to call 911 or MD.  Also discussed weather considerations and indoor options.  Pt  voiced understanding. Pt walking tolerance has improved pt is up to walking 12 laps in cardiac rehab with very little rest breaks.      Expected Outcomes Pt will build on aeorbic capacity and be compliant with home exercise program. Pt will build on aeorbic capacity and continue to improve in walking tolerance          Discharge Exercise Prescription (Final Exercise Prescription Changes):     Exercise Prescription Changes - 12/10/16 1600      Response to Exercise   Blood Pressure (Admit) 132/78   Blood Pressure (Exercise) 136/72   Blood Pressure (Exit) 140/80   Heart Rate (Admit) 80 bpm   Heart Rate (Exercise) 111 bpm   Heart Rate (Exit) 72 bpm   Rating of Perceived Exertion (Exercise) 12   Symptoms none   Duration Continue with 30 min of aerobic exercise without signs/symptoms of physical distress.   Intensity THRR unchanged     Progression   Progression Continue to progress workloads to maintain intensity without signs/symptoms of physical distress.   Average METs 2.8      Resistance Training   Training Prescription Yes   Weight 4lbs   Reps 10-15   Time 10 Minutes     NuStep   Level 3   SPM 80   Minutes 10   METs 2.8     Arm Ergometer   Level 1   Watts 44   Minutes 10   METs 3.42     Track   Laps 7   Minutes 10   METs 2.2     Home Exercise Plan   Plans to continue exercise at Home (comment)  walking and riding stationary bike   Frequency Add 2 additional days to program exercise sessions.   Initial Home Exercises Provided 11/18/16      Nutrition:  Target Goals: Understanding of nutrition guidelines, daily intake of sodium 1500mg , cholesterol 200mg , calories 93% from fat and 7% or less from saturated fats, daily to have 5 or more servings of fruits and vegetables.  Biometrics:     Pre Biometrics - 10/31/16 1017      Pre Biometrics   Waist Circumference 41.5 inches   Hip Circumference 50 inches   Waist to Hip Ratio 0.83 %   Triceps Skinfold 41 mm   % Body Fat 52.1 %   Grip Strength 30 kg   Flexibility 9 in   Single Leg Stand 3.2 seconds       Nutrition Therapy Plan and Nutrition Goals:     Nutrition Therapy & Goals - 11/01/16 0746      Nutrition Therapy   Diet Carb Modified, Therapeutic Lifestyle Changes     Personal Nutrition Goals   Nutrition Goal Wt loss of 1-2 lb/week to a wt loss goal of 6-24 lb at graduation from [facility]. Pt long-term wt loss goal wt is 180 lb.      Intervention Plan   Intervention Prescribe, educate and counsel regarding individualized specific dietary modifications aiming towards targeted core components such as weight, hypertension, lipid management, diabetes, heart failure and other comorbidities.   Expected Outcomes Short Term Goal: Understand basic principles of dietary content, such as calories, fat, sodium, cholesterol and nutrients.;Long Term Goal: Adherence to prescribed nutrition plan.      Nutrition Discharge: Nutrition Scores:     Nutrition Assessments - 11/01/16  0746      MEDFICTS Scores   Pre Score 30      Nutrition Goals Re-Evaluation:  Nutrition Goals Re-Evaluation:   Nutrition Goals Discharge (Final Nutrition Goals Re-Evaluation):   Psychosocial: Target Goals: Acknowledge presence or absence of significant depression and/or stress, maximize coping skills, provide positive support system. Participant is able to verbalize types and ability to use techniques and skills needed for reducing stress and depression.  Initial Review & Psychosocial Screening:     Initial Psych Review & Screening - 11/10/16 2049      Initial Review   Current issues with None Identified     Family Dynamics   Good Support System? Yes     Barriers   Psychosocial barriers to participate in program There are no identifiable barriers or psychosocial needs.     Screening Interventions   Interventions Encouraged to exercise      Quality of Life Scores:     Quality of Life - 10/31/16 1021      Quality of Life Scores   Health/Function Pre 25.08 %   Socioeconomic Pre 27.07 %   Psych/Spiritual Pre 28.93 %   Family Pre 24 %   GLOBAL Pre 26.26 %      PHQ-9: Recent Review Flowsheet Data    Depression screen [facility] 2/9 06/26/2016 02/28/2016 12/27/2015 08/23/2015 02/15/2015   Decreased Interest 0 0 0 0 0   Down, Depressed, Hopeless 0 0 0 0 0   PHQ - 2 Score 0 0 0 0 0     Interpretation of Total Score  Total Score Depression Severity:  1-4 = Minimal depression, 5-9 = Mild depression, 10-14 = Moderate depression, 15-19 = Moderately severe depression, 20-27 = Severe depression   Psychosocial Evaluation and Intervention:     Psychosocial Evaluation - 01/07/17 2319      Psychosocial Evaluation & Interventions   Interventions Relaxation education;Stress management education;Encouraged to exercise with the program and follow exercise prescription   Expected Outcomes Pt continue to display positive and healthy coping skills.   Continue Psychosocial Services   No Follow up required      Psychosocial Re-Evaluation:     Psychosocial Re-Evaluation    [place] Name 11/06/16 1457 11/10/16 2049 12/09/16 1347 01/07/17 2319       Psychosocial Re-Evaluation   Current issues with None Identified None Identified None Identified  -    Interventions Encouraged to attend Cardiac Rehabilitation for the exercise;Stress management education Encouraged to attend Cardiac Rehabilitation for the exercise;Stress management education;Relaxation education Encouraged to attend Cardiac Rehabilitation for the exercise;Stress management education;Relaxation education Encouraged to attend Cardiac Rehabilitation for the exercise;Stress management education;Relaxation education    Continue Psychosocial Services  Follow up required by staff Follow up required by staff No Follow up required No Follow up required       Psychosocial Discharge (Final Psychosocial Re-Evaluation):     Psychosocial Re-Evaluation - 01/07/17 2319      Psychosocial Re-Evaluation   Interventions Encouraged to attend Cardiac Rehabilitation for the exercise;Stress management education;Relaxation education   Continue Psychosocial Services  No Follow up required      Vocational Rehabilitation: Provide vocational rehab assistance to qualifying candidates.   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 Participant will state understanding/return demonstration of topics presented.  Learning Barriers/Preferences:  Learning Barriers/Preferences - 10/31/16 0741      Learning Barriers/Preferences   Learning Preferences Skilled Demonstration;Written Material;Verbal Instruction       Education Topics: Count Your Pulse:  -Group instruction provided by verbal instruction, demonstration, patient participation and written materials to support subject.  Instructors address importance of being able to find your pulse and how to count your pulse when at home without a heart monitor.  Patients get hands on experience counting their pulse with staff help and individually.   CARDIAC REHAB PHASE II EXERCISE from 01/03/2017 in [place]  Date  01/03/17  Instruction Review Code  2- meets goals/outcomes      Heart Attack, Angina, and Risk Factor Modification:  -Group instruction provided by verbal instruction, video, and written materials to support subject.  Instructors address signs and symptoms of angina and heart attacks.    Also discuss risk factors for heart disease and how to make changes to improve heart health risk factors.   Functional Fitness:  -Group instruction provided by verbal instruction, demonstration, patient participation, and written materials to support subject.  Instructors address safety measures for doing things around the house.  Discuss how to get up and down off the floor, how to pick things up properly, how to safely get out of a chair without assistance, and balance training.   CARDIAC REHAB PHASE II EXERCISE from 01/03/2017 in [place]  Date  12/13/16  Instruction Review Code  2- meets goals/outcomes      Meditation and Mindfulness:  -Group instruction provided by verbal instruction, patient participation, and written materials to support subject.  Instructor addresses importance of mindfulness and meditation practice to help reduce stress and improve awareness.  Instructor also leads participants through a meditation exercise.    CARDIAC REHAB PHASE II EXERCISE from 01/03/2017 in [place]  Date  12/11/16  Instruction Review Code  2- meets goals/outcomes       Stretching for Flexibility and Mobility:  -Group instruction provided by verbal instruction, patient participation, and written materials to support subject.  Instructors lead participants through series of stretches that are designed to increase flexibility thus improving mobility.  These stretches are additional exercise for major muscle groups that are typically performed during regular warm up and cool down.   CARDIAC REHAB PHASE II EXERCISE from 01/03/2017 in [place]  Date  12/20/16  Educator  EP  Instruction Review Code  R- Review/reinforce      Hands Only CPR:  -Group verbal, video, and participation provides a basic overview of AHA guidelines for community CPR. Role-play of emergencies allow participants the opportunity to practice calling for help and chest compression technique with discussion of AED use.   Hypertension: -Group verbal and written instruction that provides a basic overview of hypertension including the most recent diagnostic guidelines, risk factor reduction with self-care instructions and medication management.   CARDIAC REHAB PHASE II EXERCISE from 01/03/2017 in [place]  Date  12/06/16  Instruction Review Code  2- meets goals/outcomes       Nutrition I class: Heart Healthy Eating:  -Group instruction provided by PowerPoint slides, verbal discussion, and written materials to support subject matter. The instructor gives an explanation and review of the Therapeutic Lifestyle Changes diet recommendations, which includes a discussion on lipid goals, dietary fat, sodium, fiber, plant stanol/sterol esters, sugar, and the components of a well-balanced, healthy diet.  Nutrition II class: Lifestyle Skills:  -Group instruction provided by PowerPoint slides, verbal discussion, and written materials to support subject matter. The instructor gives an explanation and review of label reading, grocery  shopping for heart health, heart healthy recipe modifications, and ways to make healthier choices when eating out.   Diabetes Question & Answer:  -Group instruction provided by PowerPoint slides, verbal discussion, and written materials to support subject matter. The instructor gives an explanation and review of diabetes co-morbidities, pre- and post-prandial blood glucose goals, pre-exercise blood glucose goals, signs, symptoms, and treatment of hypoglycemia and hyperglycemia, and foot care basics.   CARDIAC REHAB PHASE II EXERCISE from 01/03/2017 in [place]  Date  11/29/16  Educator  RD  Instruction Review Code  2- meets goals/outcomes      Diabetes Blitz:  -Group instruction provided by PowerPoint slides, verbal discussion, and written materials to support subject matter. The instructor gives an explanation and review of the physiology behind type 1 and type 2 diabetes, diabetes medications and rational behind using different medications, pre- and post-prandial blood glucose recommendations and Hemoglobin A1c goals, diabetes diet, and exercise including blood glucose guidelines for exercising safely.    Portion Distortion:  -Group instruction provided by PowerPoint slides, verbal discussion, written materials, and food models to support subject matter. The instructor gives an explanation of serving size versus portion size, changes in portions sizes over the last 20 years, and what consists of a serving from each food group.   CARDIAC REHAB PHASE II EXERCISE from 01/03/2017 in [facility]  Date  12/18/16  Educator  RD  Instruction Review Code  2- meets goals/outcomes      Stress Management:  -Group instruction provided by verbal instruction, video, and written materials to support subject matter.  Instructors review role of stress in heart disease and how to cope with stress positively.     CARDIAC REHAB PHASE II EXERCISE  from 01/03/2017 in [place]  Date  12/25/16  Instruction Review Code  2- meets goals/outcomes      Exercising on Your Own:  -Group instruction provided by verbal instruction, power point, and written materials to support subject.  Instructors discuss benefits of exercise, components of exercise, frequency and intensity of exercise, and end points for exercise.  Also discuss use of nitroglycerin and activating EMS.  Review options of places to exercise outside of rehab.  Review guidelines for sex with heart disease.   CARDIAC REHAB PHASE II EXERCISE from 01/03/2017 in [facility]  Date  11/13/16  Instruction Review Code  2- meets goals/outcomes      Cardiac Drugs I:  -Group instruction provided by verbal instruction and written materials to support subject.  Instructor reviews cardiac drug classes: antiplatelets, anticoagulants, beta blockers, and statins.  Instructor discusses reasons, side effects, and lifestyle considerations for each drug class.   CARDIAC REHAB PHASE II EXERCISE from 01/03/2017 in [facility]  Date  12/04/16  Instruction Review Code  2- meets goals/outcomes      Cardiac Drugs II:  -Group instruction provided by verbal instruction and written materials to support subject.  Instructor reviews cardiac drug classes: angiotensin converting enzyme inhibitors (ACE-I), angiotensin II receptor blockers (ARBs), nitrates, and calcium channel blockers.  Instructor discusses reasons, side effects, and lifestyle considerations for each drug class.   Anatomy and Physiology of the Circulatory System:  Group verbal and written  instruction and models provide basic cardiac anatomy and physiology, with the coronary electrical and arterial systems. Review of: AMI, Angina, Valve disease, Heart Failure, Peripheral Artery Disease, Cardiac Arrhythmia, Pacemakers, and the ICD.   CARDIAC REHAB PHASE II  EXERCISE from 01/03/2017 in [facility]  Date  11/27/16  Educator  RN  Instruction Review Code  2- meets goals/outcomes      Other Education:  -Group or individual verbal, written, or video instructions that support the educational goals of the cardiac rehab program.   Knowledge Questionnaire Score:     Knowledge Questionnaire Score - 10/31/16 0736      Knowledge Questionnaire Score   Pre Score 22/24      Core Components/Risk Factors/Patient Goals at Admission:     Personal Goals and Risk Factors at Admission - 10/31/16 1142      Core Components/Risk Factors/Patient Goals on Admission   Diabetes Yes      Core Components/Risk Factors/Patient Goals Review:      Goals and Risk Factor Review    Row Name 11/10/16 2047 12/09/16 1342 01/07/17 2318         Core Components/Risk Factors/Patient Goals Review   Personal Goals Review Weight Management/Obesity;Lipids;Other;Hypertension;Diabetes Weight Management/Obesity;Lipids;Other;Hypertension;Diabetes Weight Management/Obesity;Lipids;Other;Hypertension;Diabetes     Review Pt is off to a good start toward reduction of modifiable risk factors Pt is off to a good start toward reduction of modifiable risk factors. 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 Pt is making progress toward reduction of modifiable risk factors. 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     Expected Outcomes  - Pt will display and/or make desired progress toward the desired weight loss, lipid panel readings within normal limtis, Maintenance of HGA1C less than 6.0. Pt will display and/or make desired progress toward the desired weight loss, lipid panel readings within normal limtis, Maintenance of HGA1C less than 6.0.        Core Components/Risk Factors/Patient Goals at Discharge (Final Review):      Goals and Risk Factor Review - 01/07/17 2318      Core Components/Risk  Factors/Patient Goals Review   Personal Goals Review Weight Management/Obesity;Lipids;Other;Hypertension;Diabetes   Review Pt is making progress toward reduction of modifiable risk factors. 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   Expected Outcomes Pt will display and/or make desired progress toward the desired weight loss, lipid panel readings within normal limtis, Maintenance of HGA1C less than 6.0.      ITP Comments:     ITP Comments    Row Name 10/31/16 0735           ITP Comments Dr. Fransico Him, Medical Director          Comments:  Pt is making expected progress toward personal goals after completing 22 sessions. Psychosocial assessment - Pt exhibits positive coping skills, hopeful outlook with supportive family. 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 Recommend continued exercise and life style modification education including  stress management and relaxation techniques to decrease cardiac risk profile. Cherre Huger, BSN Cardiac and Training and development officer

## 2017-01-08 ENCOUNTER — Encounter (HOSPITAL_COMMUNITY)
Admission: RE | Admit: 2017-01-08 | Discharge: 2017-01-08 | Disposition: A | Discharge status: Home / Self Care | Payer: Medicare Other | Source: Ambulatory Visit | Attending: Cardiovascular Disease | Admitting: Cardiovascular Disease

## 2017-01-08 DIAGNOSIS — Z7984 Long term (current) use of oral hypoglycemic drugs: Secondary | ICD-10-CM | POA: Diagnosis not present

## 2017-01-08 DIAGNOSIS — Z7951 Long term (current) use of inhaled steroids: Secondary | ICD-10-CM | POA: Diagnosis not present

## 2017-01-08 DIAGNOSIS — Z952 Presence of prosthetic heart valve: Secondary | ICD-10-CM | POA: Diagnosis not present

## 2017-01-08 DIAGNOSIS — Z7902 Long term (current) use of antithrombotics/antiplatelets: Secondary | ICD-10-CM | POA: Diagnosis not present

## 2017-01-08 DIAGNOSIS — Z79899 Other long term (current) drug therapy: Secondary | ICD-10-CM | POA: Diagnosis not present

## 2017-01-08 DIAGNOSIS — Z7982 Long term (current) use of aspirin: Secondary | ICD-10-CM | POA: Diagnosis not present

## 2017-01-08 NOTE — Progress Notes (Signed)
Denise [name] 71 y.o. female       Nutrition Note  s/p TAVR  Note Spoke with pt. Nutrition plan and survey reviewed with pt. Pt wants to lose wt. Pt has maintained her wt since starting rehab. Wt loss tips reviewed. Pt is diabetic. Pt checks CBG's 3 times daily. Fasting CBG's reportedly 80-96 mg/dL. Pt previously c/o difficulty chewing due to several teeth removed before surgery. Pt states she has completed a dental form for [facility] dental school. Pt expects to hear from the dental school re: dental care in Sept/Oct. Pt states she has reviewed the nutrition class handouts and denies any questions re: information given. Pt expressed understanding of the information reviewed.  Nutrition Diagnosis ? Food-and nutrition-related knowledge deficit related to lack of exposure to information as related to diagnosis of: ? CVD ? DM ? Obesity related to excessive energy intake as evidenced by a BMI of 42.1  Nutrition Intervention Pt's individual nutrition plan reviewed with pt. Benefits of adopting Therapeutic Lifestyle Changes discussed when Medficts reviewed.   Nutrition Goal(s):  ? Pt to identify food quantities necessary to achieve weight loss of 6-24 lb (2.7-10.9 kg) at graduation from cardiac rehab. Goal wt of 180 lb desired.   Plan:  Will provide client-centered nutrition education as part of interdisciplinary care.   Monitor and evaluate progress toward nutrition goal with team.  Derek Mound, M.Ed, RD, LDN, CDE 01/08/2017 12:18 PM

## 2017-01-10 ENCOUNTER — Encounter (HOSPITAL_COMMUNITY): Payer: Medicare Other

## 2017-01-13 ENCOUNTER — Encounter (HOSPITAL_COMMUNITY)
Admission: RE | Admit: 2017-01-13 | Discharge: 2017-01-13 | Disposition: A | Discharge status: Home / Self Care | Payer: Medicare Other | Source: Ambulatory Visit

## 2017-01-13 ENCOUNTER — Encounter (HOSPITAL_COMMUNITY): Admission: RE | Admit: 2017-01-13 | Payer: Medicare Other | Source: Ambulatory Visit

## 2017-01-13 DIAGNOSIS — Z7902 Long term (current) use of antithrombotics/antiplatelets: Secondary | ICD-10-CM | POA: Diagnosis not present

## 2017-01-13 DIAGNOSIS — Z7982 Long term (current) use of aspirin: Secondary | ICD-10-CM | POA: Diagnosis not present

## 2017-01-13 DIAGNOSIS — Z79899 Other long term (current) drug therapy: Secondary | ICD-10-CM | POA: Diagnosis not present

## 2017-01-13 DIAGNOSIS — Z7951 Long term (current) use of inhaled steroids: Secondary | ICD-10-CM | POA: Diagnosis not present

## 2017-01-13 DIAGNOSIS — Z952 Presence of prosthetic heart valve: Secondary | ICD-10-CM | POA: Diagnosis not present

## 2017-01-13 DIAGNOSIS — Z7984 Long term (current) use of oral hypoglycemic drugs: Secondary | ICD-10-CM | POA: Diagnosis not present

## 2017-01-14 ENCOUNTER — Other Ambulatory Visit: Payer: Self-pay | Admitting: Physician Assistant

## 2017-01-15 ENCOUNTER — Other Ambulatory Visit: Payer: Self-pay | Admitting: Cardiovascular Disease

## 2017-01-15 ENCOUNTER — Encounter (HOSPITAL_COMMUNITY): Payer: Medicare Other

## 2017-01-15 ENCOUNTER — Encounter (HOSPITAL_COMMUNITY)
Admission: RE | Admit: 2017-01-15 | Discharge: 2017-01-15 | Disposition: A | Discharge status: Home / Self Care | Payer: Medicare Other | Source: Ambulatory Visit | Attending: Cardiovascular Disease | Admitting: Cardiovascular Disease

## 2017-01-15 ENCOUNTER — Encounter: Payer: Self-pay | Admitting: Cardiovascular Disease

## 2017-01-15 DIAGNOSIS — Z952 Presence of prosthetic heart valve: Secondary | ICD-10-CM | POA: Diagnosis not present

## 2017-01-15 DIAGNOSIS — Z7951 Long term (current) use of inhaled steroids: Secondary | ICD-10-CM | POA: Diagnosis not present

## 2017-01-15 DIAGNOSIS — Z7982 Long term (current) use of aspirin: Secondary | ICD-10-CM | POA: Diagnosis not present

## 2017-01-15 DIAGNOSIS — Z79899 Other long term (current) drug therapy: Secondary | ICD-10-CM | POA: Diagnosis not present

## 2017-01-15 DIAGNOSIS — Z7984 Long term (current) use of oral hypoglycemic drugs: Secondary | ICD-10-CM | POA: Diagnosis not present

## 2017-01-15 DIAGNOSIS — Z7902 Long term (current) use of antithrombotics/antiplatelets: Secondary | ICD-10-CM | POA: Diagnosis not present

## 2017-01-15 MED ORDER — CLOPIDOGREL BISULFATE 75 MG PO TABS: 75.0000 mg | ORAL_TABLET | Freq: Every day | ORAL | 8 refills | Status: DC

## 2017-01-15 NOTE — Telephone Encounter (Signed)
Pt's medication was sent to pt's pharmacy. Confirmation received °

## 2017-01-17 ENCOUNTER — Encounter: Payer: Self-pay | Admitting: Cardiovascular Disease

## 2017-01-17 ENCOUNTER — Encounter (HOSPITAL_COMMUNITY): Payer: Medicare Other

## 2017-01-17 ENCOUNTER — Ambulatory Visit (INDEPENDENT_AMBULATORY_CARE_PROVIDER_SITE_OTHER): Payer: Medicare Other | Admitting: Cardiovascular Disease

## 2017-01-17 VITALS — BP 153/75 | HR 62 | Ht 61.0 in | Wt 221.6 lb

## 2017-01-17 DIAGNOSIS — E7800 Pure hypercholesterolemia, unspecified: Secondary | ICD-10-CM

## 2017-01-17 DIAGNOSIS — I110 Hypertensive heart disease with heart failure: Secondary | ICD-10-CM | POA: Diagnosis not present

## 2017-01-17 DIAGNOSIS — Z5181 Encounter for therapeutic drug level monitoring: Secondary | ICD-10-CM

## 2017-01-17 DIAGNOSIS — Z952 Presence of prosthetic heart valve: Secondary | ICD-10-CM | POA: Diagnosis not present

## 2017-01-17 MED ORDER — LISINOPRIL 10 MG PO TABS: ORAL_TABLET | ORAL | 1 refills | Status: DC

## 2017-01-17 NOTE — Patient Instructions (Signed)
Medication Instructions:  INCREASE YOUR LISINOPRIL TO 30 MG DAILY   Labwork: FASTING LP/CMET IN 1 WEEK   Testing/Procedures: NONE  Follow-Up: Your physician recommends that you schedule a follow-up appointment in: 2 MONTH OV  If you need a refill on your cardiac medications before your next appointment, please call your pharmacy.
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
## 2017-01-20 ENCOUNTER — Encounter (HOSPITAL_COMMUNITY): Payer: Medicare Other

## 2017-01-20 ENCOUNTER — Encounter (HOSPITAL_COMMUNITY)
Admission: RE | Admit: 2017-01-20 | Discharge: 2017-01-20 | Disposition: A | Discharge status: Home / Self Care | Payer: Medicare Other | Source: Ambulatory Visit | Attending: Cardiovascular Disease | Admitting: Cardiovascular Disease

## 2017-01-20 DIAGNOSIS — Z7984 Long term (current) use of oral hypoglycemic drugs: Secondary | ICD-10-CM | POA: Diagnosis not present

## 2017-01-20 DIAGNOSIS — Z7902 Long term (current) use of antithrombotics/antiplatelets: Secondary | ICD-10-CM | POA: Diagnosis not present

## 2017-01-20 DIAGNOSIS — Z79899 Other long term (current) drug therapy: Secondary | ICD-10-CM | POA: Diagnosis not present

## 2017-01-20 DIAGNOSIS — Z952 Presence of prosthetic heart valve: Secondary | ICD-10-CM

## 2017-01-20 DIAGNOSIS — Z7951 Long term (current) use of inhaled steroids: Secondary | ICD-10-CM | POA: Diagnosis not present

## 2017-01-20 DIAGNOSIS — Z7982 Long term (current) use of aspirin: Secondary | ICD-10-CM | POA: Diagnosis not present

## 2017-01-22 ENCOUNTER — Encounter (HOSPITAL_COMMUNITY)
Admission: RE | Admit: 2017-01-22 | Discharge: 2017-01-22 | Disposition: A | Discharge status: Home / Self Care | Payer: Medicare Other | Source: Ambulatory Visit | Attending: Cardiovascular Disease | Admitting: Cardiovascular Disease

## 2017-01-22 ENCOUNTER — Encounter (HOSPITAL_COMMUNITY): Payer: Medicare Other

## 2017-01-22 DIAGNOSIS — Z79899 Other long term (current) drug therapy: Secondary | ICD-10-CM | POA: Diagnosis not present

## 2017-01-22 DIAGNOSIS — Z952 Presence of prosthetic heart valve: Secondary | ICD-10-CM | POA: Diagnosis not present

## 2017-01-22 DIAGNOSIS — Z7984 Long term (current) use of oral hypoglycemic drugs: Secondary | ICD-10-CM | POA: Diagnosis not present

## 2017-01-22 DIAGNOSIS — Z7982 Long term (current) use of aspirin: Secondary | ICD-10-CM | POA: Diagnosis not present

## 2017-01-22 DIAGNOSIS — Z7902 Long term (current) use of antithrombotics/antiplatelets: Secondary | ICD-10-CM | POA: Diagnosis not present

## 2017-01-22 DIAGNOSIS — Z7951 Long term (current) use of inhaled steroids: Secondary | ICD-10-CM | POA: Diagnosis not present

## 2017-01-24 ENCOUNTER — Encounter (HOSPITAL_COMMUNITY)
Admission: RE | Admit: 2017-01-24 | Discharge: 2017-01-24 | Disposition: A | Discharge status: Home / Self Care | Payer: Medicare Other | Source: Ambulatory Visit | Attending: Cardiovascular Disease | Admitting: Cardiovascular Disease

## 2017-01-24 ENCOUNTER — Encounter (HOSPITAL_COMMUNITY): Payer: Medicare Other

## 2017-01-24 DIAGNOSIS — Z7984 Long term (current) use of oral hypoglycemic drugs: Secondary | ICD-10-CM | POA: Diagnosis not present

## 2017-01-24 DIAGNOSIS — Z7902 Long term (current) use of antithrombotics/antiplatelets: Secondary | ICD-10-CM | POA: Diagnosis not present

## 2017-01-24 DIAGNOSIS — E7800 Pure hypercholesterolemia, unspecified: Secondary | ICD-10-CM | POA: Diagnosis not present

## 2017-01-24 DIAGNOSIS — Z952 Presence of prosthetic heart valve: Secondary | ICD-10-CM

## 2017-01-24 DIAGNOSIS — Z7982 Long term (current) use of aspirin: Secondary | ICD-10-CM | POA: Diagnosis not present

## 2017-01-24 DIAGNOSIS — Z7951 Long term (current) use of inhaled steroids: Secondary | ICD-10-CM | POA: Diagnosis not present

## 2017-01-24 DIAGNOSIS — I110 Hypertensive heart disease with heart failure: Secondary | ICD-10-CM | POA: Diagnosis not present

## 2017-01-24 DIAGNOSIS — Z5181 Encounter for therapeutic drug level monitoring: Secondary | ICD-10-CM | POA: Diagnosis not present

## 2017-01-24 DIAGNOSIS — Z79899 Other long term (current) drug therapy: Secondary | ICD-10-CM | POA: Diagnosis not present

## 2017-01-24 LAB — COMPREHENSIVE METABOLIC PANEL
ALK PHOS: 73 IU/L (ref 39–117)
ALT: 21 IU/L (ref 0–32)
AST: 24 IU/L (ref 0–40)
Albumin/Globulin Ratio: 1.7 (ref 1.2–2.2)
Albumin: 4.5 g/dL (ref 3.5–4.8)
BILIRUBIN TOTAL: 0.6 mg/dL (ref 0.0–1.2)
BUN / CREAT RATIO: 19 (ref 12–28)
BUN: 15 mg/dL (ref 8–27)
CHLORIDE: 102 mmol/L (ref 96–106)
CO2: 23 mmol/L (ref 20–29)
CREATININE: 0.79 mg/dL (ref 0.57–1.00)
Calcium: 9.8 mg/dL (ref 8.7–10.3)
GFR calc Af Amer: 87 mL/min/{1.73_m2} (ref >59)
GFR calc non Af Amer: 76 mL/min/{1.73_m2} (ref >59)
GLUCOSE: 93 mg/dL (ref 65–99)
Globulin, Total: 2.6 g/dL (ref 1.5–4.5)
Potassium: 4.3 mmol/L (ref 3.5–5.2)
Sodium: 142 mmol/L (ref 134–144)
Total Protein: 7.1 g/dL (ref 6.0–8.5)

## 2017-01-24 LAB — LIPID PANEL
CHOL/HDL RATIO: 1.8 ratio (ref 0.0–4.4)
Cholesterol, Total: 143 mg/dL (ref 100–199)
HDL: 78 mg/dL (ref >39)
LDL CALC: 56 mg/dL (ref 0–99)
Triglycerides: 46 mg/dL (ref 0–149)
VLDL CHOLESTEROL CAL: 9 mg/dL (ref 5–40)

## 2017-01-27 ENCOUNTER — Encounter (HOSPITAL_COMMUNITY)
Admission: RE | Admit: 2017-01-27 | Discharge: 2017-01-27 | Disposition: A | Discharge status: Home / Self Care | Payer: Medicare Other | Source: Ambulatory Visit | Attending: Cardiovascular Disease | Admitting: Cardiovascular Disease

## 2017-01-27 ENCOUNTER — Encounter (HOSPITAL_COMMUNITY): Payer: Medicare Other

## 2017-01-27 DIAGNOSIS — E119 Type 2 diabetes mellitus without complications: Secondary | ICD-10-CM | POA: Insufficient documentation

## 2017-01-27 DIAGNOSIS — M170 Bilateral primary osteoarthritis of knee: Secondary | ICD-10-CM | POA: Diagnosis not present

## 2017-01-27 DIAGNOSIS — Z7902 Long term (current) use of antithrombotics/antiplatelets: Secondary | ICD-10-CM | POA: Diagnosis not present

## 2017-01-27 DIAGNOSIS — E7800 Pure hypercholesterolemia, unspecified: Secondary | ICD-10-CM | POA: Insufficient documentation

## 2017-01-27 DIAGNOSIS — Z7984 Long term (current) use of oral hypoglycemic drugs: Secondary | ICD-10-CM | POA: Diagnosis not present

## 2017-01-27 DIAGNOSIS — Z853 Personal history of malignant neoplasm of breast: Secondary | ICD-10-CM | POA: Diagnosis not present

## 2017-01-27 DIAGNOSIS — Z87891 Personal history of nicotine dependence: Secondary | ICD-10-CM | POA: Insufficient documentation

## 2017-01-27 DIAGNOSIS — Z7982 Long term (current) use of aspirin: Secondary | ICD-10-CM | POA: Insufficient documentation

## 2017-01-27 DIAGNOSIS — I10 Essential (primary) hypertension: Secondary | ICD-10-CM | POA: Insufficient documentation

## 2017-01-27 DIAGNOSIS — Z6841 Body Mass Index (BMI) 40.0 and over, adult: Secondary | ICD-10-CM | POA: Insufficient documentation

## 2017-01-27 DIAGNOSIS — J45909 Unspecified asthma, uncomplicated: Secondary | ICD-10-CM | POA: Diagnosis not present

## 2017-01-27 DIAGNOSIS — E669 Obesity, unspecified: Secondary | ICD-10-CM | POA: Diagnosis not present

## 2017-01-27 DIAGNOSIS — Z952 Presence of prosthetic heart valve: Secondary | ICD-10-CM | POA: Diagnosis not present

## 2017-01-27 DIAGNOSIS — M4800 Spinal stenosis, site unspecified: Secondary | ICD-10-CM | POA: Diagnosis not present

## 2017-01-27 DIAGNOSIS — Z79899 Other long term (current) drug therapy: Secondary | ICD-10-CM | POA: Diagnosis not present

## 2017-01-27 DIAGNOSIS — Z7951 Long term (current) use of inhaled steroids: Secondary | ICD-10-CM | POA: Insufficient documentation

## 2017-01-29 ENCOUNTER — Encounter (HOSPITAL_COMMUNITY)
Admission: RE | Admit: 2017-01-29 | Discharge: 2017-01-29 | Disposition: A | Discharge status: Home / Self Care | Payer: Medicare Other | Source: Ambulatory Visit | Attending: Cardiovascular Disease | Admitting: Cardiovascular Disease

## 2017-01-29 ENCOUNTER — Encounter (HOSPITAL_COMMUNITY): Payer: Medicare Other

## 2017-01-29 DIAGNOSIS — Z79899 Other long term (current) drug therapy: Secondary | ICD-10-CM | POA: Diagnosis not present

## 2017-01-29 DIAGNOSIS — Z952 Presence of prosthetic heart valve: Secondary | ICD-10-CM | POA: Diagnosis not present

## 2017-01-29 DIAGNOSIS — Z7982 Long term (current) use of aspirin: Secondary | ICD-10-CM | POA: Diagnosis not present

## 2017-01-29 DIAGNOSIS — Z7951 Long term (current) use of inhaled steroids: Secondary | ICD-10-CM | POA: Diagnosis not present

## 2017-01-29 DIAGNOSIS — Z7902 Long term (current) use of antithrombotics/antiplatelets: Secondary | ICD-10-CM | POA: Diagnosis not present

## 2017-01-29 DIAGNOSIS — Z7984 Long term (current) use of oral hypoglycemic drugs: Secondary | ICD-10-CM | POA: Diagnosis not present

## 2017-01-29 NOTE — Addendum Note (Signed)
Encounter addended by: Dorna Bloom D on: 01/29/2017  2:56 PM<BR>    Actions taken: Flowsheet accepted

## 2017-01-31 ENCOUNTER — Encounter: Payer: Self-pay | Admitting: Podiatry

## 2017-01-31 ENCOUNTER — Encounter (HOSPITAL_COMMUNITY): Payer: Medicare Other

## 2017-01-31 ENCOUNTER — Encounter (HOSPITAL_COMMUNITY)
Admission: RE | Admit: 2017-01-31 | Discharge: 2017-01-31 | Disposition: A | Discharge status: Home / Self Care | Payer: Medicare Other | Source: Ambulatory Visit | Attending: Cardiovascular Disease | Admitting: Cardiovascular Disease

## 2017-01-31 DIAGNOSIS — Z952 Presence of prosthetic heart valve: Secondary | ICD-10-CM

## 2017-01-31 DIAGNOSIS — Z79899 Other long term (current) drug therapy: Secondary | ICD-10-CM | POA: Diagnosis not present

## 2017-01-31 DIAGNOSIS — Z7951 Long term (current) use of inhaled steroids: Secondary | ICD-10-CM | POA: Diagnosis not present

## 2017-01-31 DIAGNOSIS — Z7902 Long term (current) use of antithrombotics/antiplatelets: Secondary | ICD-10-CM | POA: Diagnosis not present

## 2017-01-31 DIAGNOSIS — Z7982 Long term (current) use of aspirin: Secondary | ICD-10-CM | POA: Diagnosis not present

## 2017-01-31 DIAGNOSIS — Z7984 Long term (current) use of oral hypoglycemic drugs: Secondary | ICD-10-CM | POA: Diagnosis not present

## 2017-02-02 ENCOUNTER — Encounter (HOSPITAL_COMMUNITY): Payer: Self-pay

## 2017-02-02 NOTE — Progress Notes (Signed)
Cardiac Individual Treatment Plan  Patient Details  Name: Denise [name] MRN: [id number] Date of Birth: 11-25-1945 Referring Provider:     CARDIAC REHAB PHASE II ORIENTATION from 10/31/2016 in [facility]  Referring Provider  Sherren Mocha MD      Initial Encounter Date:    CARDIAC REHAB PHASE II ORIENTATION from 10/31/2016 in [facility]  Date  10/31/16  Referring Provider  Sherren Mocha MD      Visit Diagnosis: 09/24/16 S/P TAVR (transcatheter aortic valve replacement)  Patient's Home Medications on Admission:  Current Outpatient Prescriptions:  .  albuterol (PROVENTIL HFA;VENTOLIN HFA) 108 (90 Base) MCG/ACT inhaler, Inhale 2 puffs into the lungs every 6 (six) hours as needed for wheezing or shortness of breath., Disp: 1 Inhaler, Rfl: 11 .  amLODipine (NORVASC) 10 MG tablet, Take 1 tablet (10 mg total) by mouth daily., Disp: 90 tablet, Rfl: 3 .  anastrozole (ARIMIDEX) 1 MG tablet, Take 1 tablet (1 mg total) by mouth daily., Disp: 90 tablet, Rfl: 3 .  aspirin 81 MG EC tablet, Take 81 mg by mouth daily.  , Disp: , Rfl:  .  atorvastatin (LIPITOR) 40 MG tablet, TAKE ONE TABLET BY MOUTH ONCE DAILY., Disp: 90 tablet, Rfl: 3 .  budesonide-formoterol (SYMBICORT) 160-4.5 MCG/ACT inhaler, Inhale 2 puffs into the lungs 2 (two) times daily., Disp: 3 Inhaler, Rfl: 1 .  Calcium-Magnesium-Vitamin D (CALCIUM 1200+D3 PO), Take 1 tablet by mouth daily., Disp: , Rfl:  .  clopidogrel (PLAVIX) 75 MG tablet, Take 1 tablet (75 mg total) by mouth daily with breakfast., Disp: 30 tablet, Rfl: 8 .  EQ ALLERGY RELIEF, CETIRIZINE, 10 MG tablet, TAKE 1 TABLET BY MOUTH ONCE DAILY AS NEEDED FOR ALLERGIES, Disp: 90 tablet, Rfl: 0 .  fluticasone (FLONASE) 50 MCG/ACT nasal spray, Place 2 sprays into both nostrils daily., Disp: 16 g, Rfl: 5 .  furosemide (LASIX) 40 MG tablet, Take 1 tablet (40 mg total) by mouth 2 (two) times daily., Disp: 180 tablet,  Rfl: 1 .  lisinopril (PRINIVIL,ZESTRIL) 10 MG tablet, TAKE 1 TABLET BY MOUTH DAILY WITH THE LISINOPRIL 20 MG, Disp: 90 tablet, Rfl: 1 .  lisinopril (PRINIVIL,ZESTRIL) 20 MG tablet, Take 1 tablet (20 mg total) by mouth daily., Disp: 30 tablet, Rfl: 3 .  metFORMIN (GLUCOPHAGE-XR) 500 MG 24 hr tablet, 4 tabs mouth in the AM, Disp: 360 tablet, Rfl: 3 .  Multiple Vitamins-Minerals (ALIVE WOMENS 50+) TABS, Take 1 tablet by mouth daily., Disp: , Rfl:  .  Omega-3 Fatty Acids (FISH OIL) 1000 MG CAPS, Take 1,000 mg by mouth daily., Disp: , Rfl:  .  Polyethyl Glycol-Propyl Glycol (SYSTANE ULTRA OP), Apply 1 drop to eye daily as needed (dry eyes)., Disp: , Rfl:  .  potassium chloride (MICRO-K) 10 MEQ CR capsule, TAKE FOUR CAPSULES BY MOUTH ONCE DAILY, Disp: 360 capsule, Rfl: 3 .  tiZANidine (ZANAFLEX) 4 MG tablet, Take 4 mg by mouth 2 (two) times daily as needed for muscle spasms., Disp: , Rfl:  .  traMADol (ULTRAM) 50 MG tablet, Take 1-2 tablets (50-100 mg total) by mouth every 4 (four) hours as needed for moderate pain., Disp: 30 tablet, Rfl: 0  Past Medical History: Past Medical History:  Diagnosis Date  . Abdominal pain, left lower quadrant 09/12/2008  . ALLERGIC RHINITIS 08/24/2007  . ANXIETY 08/24/2007  . Aortic stenosis, severe 07/19/2016  . ASTHMA 08/24/2007  . ASTHMA, WITH ACUTE EXACERBATION 03/14/2008  . Breast cancer ([facility])   .  DDD (degenerative disc disease), lumbar   . DEGENERATIVE JOINT DISEASE 08/24/2007  . DEPRESSION 08/24/2007  . DIABETES MELLITUS, TYPE II 08/24/2007  . ECZEMA 08/24/2007  . Edema 08/24/2007  . GERD 08/24/2007   not current (07/2014)  . Heart murmur   . HYPERCHOLESTEROLEMIA 08/24/2007  . HYPERLIPIDEMIA 08/24/2007  . HYPERTENSION 08/24/2007  . OBESITY 08/24/2007  . 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 Personal history of radiation therapy 2017  . POSTMENOPAUSAL STATUS 08/24/2007  . 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 SPINAL STENOSIS 08/24/2007    Tobacco Use: History  Smoking Status  . Former Smoker  Smokeless Tobacco  . Never Used    Comment: quit in 1070    Labs: Recent Review Flowsheet Data    Labs for ITP Cardiac and Pulmonary Rehab Latest Ref Rng & Units 09/24/2016 09/24/2016 09/24/2016 09/24/2016 01/24/2017   Cholestrol 100 - 199 mg/dL - - - - 143   LDLCALC 0 - 99 mg/dL - - - - 56   HDL >39 mg/dL - - - - 78   Trlycerides 0 - 149 mg/dL - - - - 46   Hemoglobin A1c 4.8 - 5.6 % - - - - -   PHART 7.350 - 7.450 - - 7.411 7.399 -   PCO2ART 32.0 - 48.0 mmHg - - 39.0 41.3 -   HCO3 20.0 - 28.0 mmol/L - - 24.8 25.7 -   TCO2 0 - 100 mmol/L 25 26 26 27  -   ACIDBASEDEF 0.0 - 2.0 mmol/L - - - - -   O2SAT % - - 99.0 99.0 -      Capillary Blood Glucose: Lab Results  Component Value Date   GLUCAP 103 (H) 12/06/2016   GLUCAP 81 11/27/2016   GLUCAP 74 11/15/2016   GLUCAP 121 (H) 11/15/2016   GLUCAP 71 11/13/2016     Exercise Target Goals:    Exercise Program Goal: Individual exercise prescription set with THRR, safety & activity barriers. Participant demonstrates ability to understand and report RPE using BORG scale, to self-measure pulse accurately, and to acknowledge the importance of the exercise prescription.  Exercise Prescription Goal: Starting with aerobic activity 30 plus minutes a day, 3 days per week for initial exercise prescription. Provide home exercise prescription and guidelines that participant acknowledges understanding prior to discharge.  Activity Barriers & Risk Stratification:     Activity Barriers & Cardiac Risk Stratification - 10/31/16 0742      Activity Barriers & Cardiac Risk Stratification   Activity Barriers Assistive Device   Cardiac Risk Stratification High      6 Minute Walk:     6 Minute Walk    Row Name 10/31/16 1013 02/03/17 1625  02/03/17 1626     6 Minute Walk   Phase Initial Discharge  -   Distance 2 feet 1070 feet 1347 feet   Distance % Change  -  - 24.79 %   Distance Feet Change  -  - 277 ft   Walk Time 6 minutes  - 6 minutes   # of Rest Breaks 0  - 0   MPH 2  - 2.55   METS 2.2  - 2.48   RPE 13  - 10   VO2 Peak 7.7  - 8.67   Symptoms No  - No   Resting HR 70 bpm  -  95 bpm   Resting BP 138/84  - 130/80   Max Ex. HR 116 bpm  - 118 bpm   Max Ex. BP 178/90  - 154/76   2 Minute Post BP 118/60  - 120/74      Oxygen Initial Assessment:   Oxygen Re-Evaluation:   Oxygen Discharge (Final Oxygen Re-Evaluation):   Initial Exercise Prescription:     Initial Exercise Prescription - 10/31/16 1000      Date of Initial Exercise RX and Referring Provider   Date 10/31/16   Referring Provider Sherren Mocha MD     Recumbant Bike   Level 1   Minutes 10   METs 2.2     NuStep   Level 1   SPM 80   Minutes 10   METs 2     Track   Laps 8   Minutes 10   METs 2.4     Prescription Details   Frequency (times per week) 3   Duration Progress to 45 minutes of aerobic exercise without signs/symptoms of physical distress     Intensity   THRR 40-80% of Max Heartrate 60-119   Ratings of Perceived Exertion 11-13   Perceived Dyspnea 0-4     Progression   Progression Continue to progress workloads to maintain intensity without signs/symptoms of physical distress.     Resistance Training   Training Prescription Yes   Weight 2   Reps 10-15      Perform Capillary Blood Glucose checks as needed.  Exercise Prescription Changes:      Exercise Prescription Changes    Row Name 11/11/16 1400 11/22/16 1647 12/10/16 1600 01/03/17 1454 01/15/17 1444     Response to Exercise   Blood Pressure (Admit) 142/88 142/82 132/78 140/84 130/82   Blood Pressure (Exercise) 188/84 150/80 136/72 162/88 172/90   Blood Pressure (Exit) 140/84 124/80 140/80 140/82 148/82   Heart Rate (Admit) 72 bpm 93 bpm 80 bpm 87 bpm  85 bpm   Heart Rate (Exercise) 120 bpm 119 bpm 111 bpm 119 bpm 108 bpm   Heart Rate (Exit) 75 bpm 89 bpm 72 bpm 87 bpm 70 bpm   Rating of Perceived Exertion (Exercise) 14 12 12 13 10    Symptoms none none none none none   Comments pt responded well to exercise session and was oriented to exercise equipment on 11/06/16  -  -  -  -   Duration Continue with 30 min of aerobic exercise without signs/symptoms of physical distress. Continue with 30 min of aerobic exercise without signs/symptoms of physical distress. Continue with 30 min of aerobic exercise without signs/symptoms of physical distress. Continue with 30 min of aerobic exercise without signs/symptoms of physical distress. Continue with 30 min of aerobic exercise without signs/symptoms of physical distress.   Intensity THRR unchanged THRR unchanged THRR unchanged THRR unchanged THRR unchanged     Progression   Progression Continue to progress workloads to maintain intensity without signs/symptoms of physical distress. Continue to progress workloads to maintain intensity without signs/symptoms of physical distress. Continue to progress workloads to maintain intensity without signs/symptoms of physical distress. Continue to progress workloads to maintain intensity without signs/symptoms of physical distress. Continue to progress workloads to maintain intensity without signs/symptoms of physical distress.   Average METs 2.4 2.4 2.8 2.5 2.6     Resistance Training   Training Prescription Yes Yes Yes Yes Yes   Weight 2lbs 4lbs 4lbs 4lbs 4lbs   Reps 10-15 10-15 10-15 10-15 10-15   Time  10 Minutes 10 Minutes 10 Minutes 10 Minutes 10 Minutes     NuStep   Level 1 3 3 4 4    SPM 80 80 80 80 90   Minutes 10 10 10 10 10    METs 1.8 2.4 2.8 2.8 3.2     Arm Ergometer   Level 1 1 1 1 1    Watts 44 44 44 14 14   Minutes 10 10 10 10 10    METs 3.42 3.42 3.42 1.99 1.99     Track   Laps 6 7 7 9 9    Minutes 10 10 10 10 10    METs 2.04 2.2 2.2 2.57 2.57      Home Exercise Plan   Plans to continue exercise at  - Home (comment)  walking and riding stationary bike Home (comment)  walking and riding stationary bike Home (comment)  walking and riding stationary bike Home (comment)  walking and riding stationary bike   Frequency  - Add 2 additional days to program exercise sessions. Add 2 additional days to program exercise sessions. Add 2 additional days to program exercise sessions. Add 2 additional days to program exercise sessions.   Initial Home Exercises Provided  - 11/18/16 11/18/16 11/18/16 11/18/16   Row Name 01/29/17 1400             Response to Exercise   Blood Pressure (Admit) 124/78       Blood Pressure (Exercise) 164/82       Blood Pressure (Exit) 128/80       Heart Rate (Admit) 83 bpm       Heart Rate (Exercise) 105 bpm       Heart Rate (Exit) 83 bpm       Rating of Perceived Exertion (Exercise) 10       Symptoms none       Duration Continue with 30 min of aerobic exercise without signs/symptoms of physical distress.       Intensity THRR unchanged         Progression   Progression Continue to progress workloads to maintain intensity without signs/symptoms of physical distress.       Average METs 3.3         Resistance Training   Training Prescription Yes       Weight 4lbs       Reps 10-15       Time 10 Minutes         NuStep   Level 5       SPM 90       Minutes 10       METs 3.1         Arm Ergometer   Level 1       Watts 44       Minutes 10       METs 3.42         Track   Laps 7       Minutes 10       METs 2.2         Home Exercise Plan   Plans to continue exercise at Home (comment)  walking and riding stationary bike       Frequency Add 2 additional days to program exercise sessions.       Initial Home Exercises Provided 11/18/16          Exercise Comments:      Exercise Comments    Row Name 11/06/16 1413 12/10/16 1652 02/03/17 1632  Exercise Comments Pt was oriented to exercise  equipment on 11/06/16. Pt did well with exercise session, and reported no signs/symptoms of CP or unusual SOB. Reviewed METs and goals. Pt is tolerating moderate intensity exercise very well; will continue to monitor pt's progress and activity levels. Reviewed METs and goals. Pt is tolerating moderate intensity exercise very well; will continue to monitor pt's progress and activity levels.        Exercise Goals and Review:      Exercise Goals    Row Name 10/31/16 0743 10/31/16 1016           Exercise Goals   Increase Physical Activity (P)  Yes Yes      Intervention (P)  Provide advice, education, support and counseling about physical activity/exercise needs.;Develop an individualized exercise prescription for aerobic and resistive training based on initial evaluation findings, risk stratification, comorbidities and participant's personal goals. Provide advice, education, support and counseling about physical activity/exercise needs.;Develop an individualized exercise prescription for aerobic and resistive training based on initial evaluation findings, risk stratification, comorbidities and participant's personal goals.      Expected Outcomes (P)  Achievement of increased cardiorespiratory fitness and enhanced flexibility, muscular endurance and strength shown through measurements of functional capacity and personal statement of participant. Achievement of increased cardiorespiratory fitness and enhanced flexibility, muscular endurance and strength shown through measurements of functional capacity and personal statement of participant.      Increase Strength and Stamina (P)  Yes Yes      Intervention (P)  Provide advice, education, support and counseling about physical activity/exercise needs.;Develop an individualized exercise prescription for aerobic and resistive training based on initial evaluation findings, risk stratification, comorbidities and participant's personal goals. Provide advice,  education, support and counseling about physical activity/exercise needs.;Develop an individualized exercise prescription for aerobic and resistive training based on initial evaluation findings, risk stratification, comorbidities and participant's personal goals.      Expected Outcomes (P)  Achievement of increased cardiorespiratory fitness and enhanced flexibility, muscular endurance and strength shown through measurements of functional capacity and personal statement of participant. Achievement of increased cardiorespiratory fitness and enhanced flexibility, muscular endurance and strength shown through measurements of functional capacity and personal statement of participant.         Exercise Goals Re-Evaluation :     Exercise Goals Re-Evaluation    Row Name 11/19/16 1041 12/10/16 1653 02/03/17 1631         Exercise Goal Re-Evaluation   Exercise Goals Review Increase Physical Activity;Increase Strenth and Stamina Increase Physical Activity;Increase Strenth and Stamina Increase Physical Activity;Able to understand and use rate of perceived exertion (RPE) scale;Knowledge and understanding of Target Heart Rate Range (THRR);Understanding of Exercise Prescription;Increase Strength and Stamina;Able to check pulse independently     Comments Reviewed home exercise with pt today.  Pt plans to walk and ride stationary bike for exercise.Discussed exercise progression and bouts of exercise to build for a total time of 20-30 minutes. Reviewed THR, pulse, RPE, sign and symptoms, and when to call 911 or MD.  Also discussed weather considerations and indoor options.  Pt voiced understanding. Pt walking tolerance has improved pt is up to walking 12 laps in cardiac rehab with very little rest breaks. Pt continues to improve in walking tolerance has made great progress in cardiac rehab. Pt increase walk test distance by 277 feet.     Expected Outcomes Pt will build on aeorbic capacity and be compliant with home  exercise program. Pt will build on aeorbic capacity  and continue to improve in walking tolerance Pt will build on aeorbic capacity and continue to improve in walking tolerance         Discharge Exercise Prescription (Final Exercise Prescription Changes):     Exercise Prescription Changes - 01/29/17 1400      Response to Exercise   Blood Pressure (Admit) 124/78   Blood Pressure (Exercise) 164/82   Blood Pressure (Exit) 128/80   Heart Rate (Admit) 83 bpm   Heart Rate (Exercise) 105 bpm   Heart Rate (Exit) 83 bpm   Rating of Perceived Exertion (Exercise) 10   Symptoms none   Duration Continue with 30 min of aerobic exercise without signs/symptoms of physical distress.   Intensity THRR unchanged     Progression   Progression Continue to progress workloads to maintain intensity without signs/symptoms of physical distress.   Average METs 3.3     Resistance Training   Training Prescription Yes   Weight 4lbs   Reps 10-15   Time 10 Minutes     NuStep   Level 5   SPM 90   Minutes 10   METs 3.1     Arm Ergometer   Level 1   Watts 44   Minutes 10   METs 3.42     Track   Laps 7   Minutes 10   METs 2.2     Home Exercise Plan   Plans to continue exercise at Home (comment)  walking and riding stationary bike   Frequency Add 2 additional days to program exercise sessions.   Initial Home Exercises Provided 11/18/16      Nutrition:  Target Goals: Understanding of nutrition guidelines, daily intake of sodium 1500mg , cholesterol 200mg , calories 30% from fat and 7% or less from saturated fats, daily to have 5 or more servings of fruits and vegetables.  Biometrics:     Pre Biometrics - 02/03/17 1629      Pre Biometrics   Height 5\' 1"  (1.549 m)   Weight 223 lb 8.7 oz (101.4 kg)   Waist Circumference 41.5 inches   Hip Circumference 50.25 inches   Waist to Hip Ratio 0.83 %   BMI (Calculated) 42.26   Triceps Skinfold 40 mm   % Body Fat 52.1 %   Grip Strength 35 kg    Flexibility 11.5 in   Single Leg Stand 4.31 seconds       Nutrition Therapy Plan and Nutrition Goals:     Nutrition Therapy & Goals - 11/01/16 0746      Nutrition Therapy   Diet Carb Modified, Therapeutic Lifestyle Changes     Personal Nutrition Goals   Nutrition Goal Wt loss of 1-2 lb/week to a wt loss goal of 6-24 lb at graduation from [facility]. Pt long-term wt loss goal wt is 180 lb.      Intervention Plan   Intervention Prescribe, educate and counsel regarding individualized specific dietary modifications aiming towards targeted core components such as weight, hypertension, lipid management, diabetes, heart failure and other comorbidities.   Expected Outcomes Short Term Goal: Understand basic principles of dietary content, such as calories, fat, sodium, cholesterol and nutrients.;Long Term Goal: Adherence to prescribed nutrition plan.      Nutrition Discharge: Nutrition Scores:     Nutrition Assessments - 11/01/16 0746      MEDFICTS Scores   Pre Score 30      Nutrition Goals Re-Evaluation:   Nutrition Goals Re-Evaluation:   Nutrition Goals Discharge (Final Nutrition Goals Re-Evaluation):  Psychosocial: Target Goals: Acknowledge presence or absence of significant depression and/or stress, maximize coping skills, provide positive support system. Participant is able to verbalize types and ability to use techniques and skills needed for reducing stress and depression.  Initial Review & Psychosocial Screening:     Initial Psych Review & Screening - 11/10/16 2049      Initial Review   Current issues with None Identified     Family Dynamics   Good Support System? Yes     Barriers   Psychosocial barriers to participate in program There are no identifiable barriers or psychosocial needs.     Screening Interventions   Interventions Encouraged to exercise      Quality of Life Scores:     Quality of Life - 10/31/16 1021      Quality of Life Scores    Health/Function Pre 25.08 %   Socioeconomic Pre 27.07 %   Psych/Spiritual Pre 28.93 %   Family Pre 24 %   GLOBAL Pre 26.26 %      PHQ-9: Recent Review Flowsheet Data    Depression screen [facility] 2/9 06/26/2016 02/28/2016 12/27/2015 08/23/2015 02/15/2015   Decreased Interest 0 0 0 0 0   Down, Depressed, Hopeless 0 0 0 0 0   PHQ - 2 Score 0 0 0 0 0     Interpretation of Total Score  Total Score Depression Severity:  1-4 = Minimal depression, 5-9 = Mild depression, 10-14 = Moderate depression, 15-19 = Moderately severe depression, 20-27 = Severe depression   Psychosocial Evaluation and Intervention:     Psychosocial Evaluation - 02/02/17 1105      Psychosocial Evaluation & Interventions   Interventions Encouraged to exercise with the program and follow exercise prescription   Comments Pt engages well with rehab staff and fellow participants.  Pt enjoys coming to cardiac rehab   Expected Outcomes Pt continue to display positive and healthy coping skills.   Continue Psychosocial Services  No Follow up required      Psychosocial Re-Evaluation:     Psychosocial Re-Evaluation    [place] Name 11/06/16 1457 11/10/16 2049 12/09/16 1347 01/07/17 2319 02/02/17 1106     Psychosocial Re-Evaluation   Current issues with None Identified None Identified None Identified  - None Identified   Interventions Encouraged to attend Cardiac Rehabilitation for the exercise;Stress management education Encouraged to attend Cardiac Rehabilitation for the exercise;Stress management education;Relaxation education Encouraged to attend Cardiac Rehabilitation for the exercise;Stress management education;Relaxation education Encouraged to attend Cardiac Rehabilitation for the exercise;Stress management education;Relaxation education Encouraged to attend Cardiac Rehabilitation for the exercise;Stress management education;Relaxation education   Continue Psychosocial Services  Follow up required by staff Follow up required by  staff No Follow up required No Follow up required No Follow up required      Psychosocial Discharge (Final Psychosocial Re-Evaluation):     Psychosocial Re-Evaluation - 02/02/17 1106      Psychosocial Re-Evaluation   Current issues with None Identified   Interventions Encouraged to attend Cardiac Rehabilitation for the exercise;Stress management education;Relaxation education   Continue Psychosocial Services  No Follow up required      Vocational Rehabilitation: Provide vocational rehab assistance to qualifying candidates.   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 Participant will state understanding/return demonstration of topics presented.  Learning Barriers/Preferences:     Learning Barriers/Preferences - 10/31/16 0741      Learning Barriers/Preferences   Learning Preferences Skilled Demonstration;Written Material;Verbal Instruction      Education Topics: Count Your Pulse:  -Group instruction provided by verbal instruction, demonstration, patient participation and written materials to support subject.  Instructors address importance of being able to find your pulse and how to count your pulse when at home without a heart monitor.  Patients get hands on experience counting their pulse with staff help and individually.   CARDIAC REHAB PHASE II EXERCISE from 01/29/2017 in [facility]  Date  01/03/17  Instruction Review Code  2- meets goals/outcomes      Heart Attack, Angina, and Risk Factor Modification:  -Group instruction provided by verbal instruction, video, and written materials to support subject.  Instructors address  signs and symptoms of angina and heart attacks.    Also discuss risk factors for heart disease and how to make changes to improve heart health risk factors.   Functional Fitness:  -Group instruction provided by verbal instruction, demonstration, patient participation, and written materials to support subject.  Instructors address safety measures for doing things around the house.  Discuss how to get up and down off the floor, how to pick things up properly, how to safely get out of a chair without assistance, and balance training.   CARDIAC REHAB PHASE II EXERCISE from 01/29/2017 in [place]  Date  12/13/16  Instruction Review Code  2- meets goals/outcomes      Meditation and Mindfulness:  -Group instruction provided by verbal instruction, patient participation, and written materials to support subject.  Instructor addresses importance of mindfulness and meditation practice to help reduce stress and improve awareness.  Instructor also leads participants through a meditation exercise.    CARDIAC REHAB PHASE II EXERCISE from 01/29/2017 in [place]  Date  01/29/17  Instruction Review Code  2- meets goals/outcomes      Stretching for Flexibility and Mobility:  -Group instruction provided by verbal instruction, patient participation, and written materials to support subject.  Instructors lead participants through series of stretches that are designed to increase flexibility thus improving mobility.  These stretches are additional exercise for major muscle groups that are typically performed during regular warm up and cool down.   CARDIAC REHAB PHASE II EXERCISE from 01/29/2017 in [place]  Date  12/20/16  Educator  EP  Instruction Review Code  R- Review/reinforce      Hands Only CPR:  -Group verbal, video, and participation provides a basic overview of AHA guidelines for community CPR. Role-play  of emergencies allow participants the opportunity to practice calling for help and chest compression technique with discussion of AED use.   Hypertension: -Group verbal and written instruction that provides a basic overview of hypertension including the most recent diagnostic guidelines, risk factor reduction with self-care instructions and medication management.   CARDIAC REHAB PHASE II EXERCISE from 01/29/2017 in [place]  Date  01/24/17  Instruction Review Code  2- meets goals/outcomes       Nutrition I class: Heart Healthy Eating:  -Group instruction provided by PowerPoint slides, verbal discussion, and written materials to support subject matter. The instructor gives an explanation and review of the Therapeutic Lifestyle  Changes diet recommendations, which includes a discussion on lipid goals, dietary fat, sodium, fiber, plant stanol/sterol esters, sugar, and the components of a well-balanced, healthy diet.   Nutrition II class: Lifestyle Skills:  -Group instruction provided by PowerPoint slides, verbal discussion, and written materials to support subject matter. The instructor gives an explanation and review of label reading, grocery shopping for heart health, heart healthy recipe modifications, and ways to make healthier choices when eating out.   Diabetes Question & Answer:  -Group instruction provided by PowerPoint slides, verbal discussion, and written materials to support subject matter. The instructor gives an explanation and review of diabetes co-morbidities, pre- and post-prandial blood glucose goals, pre-exercise blood glucose goals, signs, symptoms, and treatment of hypoglycemia and hyperglycemia, and foot care basics.   CARDIAC REHAB PHASE II EXERCISE from 01/29/2017 in [place]  Date  11/29/16  Educator  RD  Instruction Review Code  2- meets goals/outcomes      Diabetes Blitz:  -Group instruction  provided by PowerPoint slides, verbal discussion, and written materials to support subject matter. The instructor gives an explanation and review of the physiology behind type 1 and type 2 diabetes, diabetes medications and rational behind using different medications, pre- and post-prandial blood glucose recommendations and Hemoglobin A1c goals, diabetes diet, and exercise including blood glucose guidelines for exercising safely.    Portion Distortion:  -Group instruction provided by PowerPoint slides, verbal discussion, written materials, and food models to support subject matter. The instructor gives an explanation of serving size versus portion size, changes in portions sizes over the last 20 years, and what consists of a serving from each food group.   CARDIAC REHAB PHASE II EXERCISE from 01/29/2017 in [place]  Date  12/18/16  Educator  RD  Instruction Review Code  2- meets goals/outcomes      Stress Management:  -Group instruction provided by verbal instruction, video, and written materials to support subject matter.  Instructors review role of stress in heart disease and how to cope with stress positively.     CARDIAC REHAB PHASE II EXERCISE from 01/29/2017 in [place]  Date  12/25/16  Instruction Review Code  2- meets goals/outcomes      Exercising on Your Own:  -Group instruction provided by verbal instruction, power point, and written materials to support subject.  Instructors discuss benefits of exercise, components of exercise, frequency and intensity of exercise, and end points for exercise.  Also discuss use of nitroglycerin and activating EMS.  Review options of places to exercise outside of rehab.  Review guidelines for sex with heart disease.   CARDIAC REHAB PHASE II EXERCISE from 01/29/2017 in [place]  Date  11/13/16  Instruction Review Code  2- meets goals/outcomes       Cardiac Drugs I:  -Group instruction provided by verbal instruction and written materials to support subject.  Instructor reviews cardiac drug classes: antiplatelets, anticoagulants, beta blockers, and statins.  Instructor discusses reasons, side effects, and lifestyle considerations for each drug class.   CARDIAC REHAB PHASE II EXERCISE from 01/29/2017 in [place]  Date  01/08/17  Instruction Review Code  2- meets goals/outcomes      Cardiac Drugs II:  -Group instruction provided by verbal instruction and written materials to support subject.  Instructor reviews cardiac drug classes: angiotensin converting enzyme inhibitors (ACE-I), angiotensin II receptor blockers (ARBs), nitrates, and calcium channel  blockers.  Instructor discusses reasons, side effects, and lifestyle considerations for each drug class.   Anatomy and Physiology of the Circulatory System:  Group verbal and written instruction and models provide basic cardiac anatomy and physiology, with the coronary electrical and arterial systems. Review of: AMI, Angina, Valve disease, Heart Failure, Peripheral Artery Disease, Cardiac Arrhythmia, Pacemakers, and the ICD.   CARDIAC REHAB PHASE II EXERCISE from 01/29/2017 in [place]  Date  01/22/17  Educator  RN  Instruction Review Code  2- meets goals/outcomes      Other Education:  -Group or individual verbal, written, or video instructions that support the educational goals of the cardiac rehab program.   Knowledge Questionnaire Score:     Knowledge Questionnaire Score - 10/31/16 0736      Knowledge Questionnaire Score   Pre Score 22/24      Core Components/Risk Factors/Patient Goals at Admission:     Personal Goals and Risk Factors at Admission - 10/31/16 1142      Core Components/Risk Factors/Patient Goals on Admission   Diabetes Yes      Core Components/Risk Factors/Patient Goals Review:       Goals and Risk Factor Review    Row Name 11/10/16 2047 12/09/16 1342 01/07/17 2318 02/02/17 1103 02/02/17 1114     Core Components/Risk Factors/Patient Goals Review   Personal Goals Review Weight Management/Obesity;Lipids;Other;Hypertension;Diabetes Weight Management/Obesity;Lipids;Other;Hypertension;Diabetes Weight Management/Obesity;Lipids;Other;Hypertension;Diabetes Weight Management/Obesity;Lipids;Other;Hypertension;Diabetes  -   Review Pt is off to a good start toward reduction of modifiable risk factors Pt is off to a good start toward reduction of modifiable risk factors. 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 Pt is making progress toward reduction of modifiable risk factors. 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 Pt is making progress toward reduction of modifiable risk factors. 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 Pt has periodic elevation in exertional bp. continuing to monitor. Pt is making progress toward reduction of modifiable risk factors. 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 Pt has periodic elevation in exertional bp. Pt with increase in lisinopril to 30 mg dialy.continuing to monitor.   Expected Outcomes  - Pt will display and/or make desired progress toward the desired weight loss, lipid panel readings within normal limtis, Maintenance of HGA1C less than 6.0. Pt will display and/or make desired progress toward the desired weight loss, lipid panel readings within normal limtis, Maintenance of HGA1C less than 6.0. Pt will display and/or make desired progress toward the desired weight loss, lipid panel readings within normal limtis, Maintenance of HGA1C less than 6.0.  -      Core Components/Risk Factors/Patient Goals at Discharge (Final Review):      Goals and Risk Factor Review - 02/02/17 1114      Core Components/Risk Factors/Patient Goals Review   Review Pt is making  progress toward reduction of modifiable risk factors. 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 Pt has periodic elevation in exertional bp. Pt with increase in lisinopril to 30 mg dialy.continuing to monitor.      ITP Comments:     ITP Comments    Row Name 10/31/16 0735           ITP Comments Dr. Fransico Him, Medical Director          Comments:  Carolyne Littles is making expected progress toward personal goals after completing  31 sessions.Psychosocial Assessment - Pt exhibits positive coping skills, hopeful outlook with supportive family.  Pt recently saw Dr. Oval Linsey in the office.  Pt reports "feeling much better and has more energy.  Before the valve was replaced she did not know she felt "bad" and appreciates the difference".  Recommend continued exercise and life style modification education including  stress management and relaxation techniques to decrease cardiac risk profile. Cherre Huger, BSN Cardiac and Training and development officer

## 2017-02-03 ENCOUNTER — Encounter (HOSPITAL_COMMUNITY)
Admission: RE | Admit: 2017-02-03 | Discharge: 2017-02-03 | Disposition: A | Discharge status: Home / Self Care | Payer: Medicare Other | Source: Ambulatory Visit | Attending: Cardiovascular Disease | Admitting: Cardiovascular Disease

## 2017-02-03 ENCOUNTER — Encounter (HOSPITAL_COMMUNITY): Payer: Medicare Other

## 2017-02-03 VITALS — Ht 61.0 in | Wt 223.5 lb

## 2017-02-03 DIAGNOSIS — Z952 Presence of prosthetic heart valve: Secondary | ICD-10-CM

## 2017-02-03 DIAGNOSIS — Z79899 Other long term (current) drug therapy: Secondary | ICD-10-CM | POA: Diagnosis not present

## 2017-02-03 DIAGNOSIS — Z7984 Long term (current) use of oral hypoglycemic drugs: Secondary | ICD-10-CM | POA: Diagnosis not present

## 2017-02-03 DIAGNOSIS — Z7951 Long term (current) use of inhaled steroids: Secondary | ICD-10-CM | POA: Diagnosis not present

## 2017-02-03 DIAGNOSIS — Z7982 Long term (current) use of aspirin: Secondary | ICD-10-CM | POA: Diagnosis not present

## 2017-02-03 DIAGNOSIS — Z7902 Long term (current) use of antithrombotics/antiplatelets: Secondary | ICD-10-CM | POA: Diagnosis not present

## 2017-02-05 ENCOUNTER — Encounter (HOSPITAL_COMMUNITY): Payer: Medicare Other

## 2017-02-05 ENCOUNTER — Encounter: Payer: Self-pay | Admitting: Cardiovascular Disease

## 2017-02-07 ENCOUNTER — Inpatient Hospital Stay (HOSPITAL_COMMUNITY)
Admission: RE | Admit: 2017-02-07 | Discharge: 2017-02-07 | Disposition: A | Discharge status: Home / Self Care | Payer: Medicare Other | Source: Ambulatory Visit

## 2017-02-07 DIAGNOSIS — Z7984 Long term (current) use of oral hypoglycemic drugs: Secondary | ICD-10-CM | POA: Diagnosis not present

## 2017-02-07 DIAGNOSIS — Z952 Presence of prosthetic heart valve: Secondary | ICD-10-CM | POA: Diagnosis not present

## 2017-02-07 DIAGNOSIS — Z7982 Long term (current) use of aspirin: Secondary | ICD-10-CM | POA: Diagnosis not present

## 2017-02-07 DIAGNOSIS — Z7902 Long term (current) use of antithrombotics/antiplatelets: Secondary | ICD-10-CM | POA: Diagnosis not present

## 2017-02-07 DIAGNOSIS — Z79899 Other long term (current) drug therapy: Secondary | ICD-10-CM | POA: Diagnosis not present

## 2017-02-07 DIAGNOSIS — Z7951 Long term (current) use of inhaled steroids: Secondary | ICD-10-CM | POA: Diagnosis not present

## 2017-02-10 ENCOUNTER — Encounter (HOSPITAL_COMMUNITY)
Admission: RE | Admit: 2017-02-10 | Discharge: 2017-02-10 | Disposition: A | Discharge status: Home / Self Care | Payer: Medicare Other | Source: Ambulatory Visit | Attending: Cardiovascular Disease | Admitting: Cardiovascular Disease

## 2017-02-10 DIAGNOSIS — Z7982 Long term (current) use of aspirin: Secondary | ICD-10-CM | POA: Diagnosis not present

## 2017-02-10 DIAGNOSIS — Z7951 Long term (current) use of inhaled steroids: Secondary | ICD-10-CM | POA: Diagnosis not present

## 2017-02-10 DIAGNOSIS — Z7984 Long term (current) use of oral hypoglycemic drugs: Secondary | ICD-10-CM | POA: Diagnosis not present

## 2017-02-10 DIAGNOSIS — Z79899 Other long term (current) drug therapy: Secondary | ICD-10-CM | POA: Diagnosis not present

## 2017-02-10 DIAGNOSIS — Z952 Presence of prosthetic heart valve: Secondary | ICD-10-CM | POA: Diagnosis not present

## 2017-02-10 DIAGNOSIS — Z7902 Long term (current) use of antithrombotics/antiplatelets: Secondary | ICD-10-CM | POA: Diagnosis not present

## 2017-02-12 ENCOUNTER — Encounter (HOSPITAL_COMMUNITY)
Admission: RE | Admit: 2017-02-12 | Discharge: 2017-02-12 | Disposition: A | Discharge status: Home / Self Care | Payer: Medicare Other | Source: Ambulatory Visit | Attending: Cardiovascular Disease | Admitting: Cardiovascular Disease

## 2017-02-12 DIAGNOSIS — Z7984 Long term (current) use of oral hypoglycemic drugs: Secondary | ICD-10-CM | POA: Diagnosis not present

## 2017-02-12 DIAGNOSIS — Z7902 Long term (current) use of antithrombotics/antiplatelets: Secondary | ICD-10-CM | POA: Diagnosis not present

## 2017-02-12 DIAGNOSIS — Z952 Presence of prosthetic heart valve: Secondary | ICD-10-CM | POA: Diagnosis not present

## 2017-02-12 DIAGNOSIS — Z7951 Long term (current) use of inhaled steroids: Secondary | ICD-10-CM | POA: Diagnosis not present

## 2017-02-12 DIAGNOSIS — Z79899 Other long term (current) drug therapy: Secondary | ICD-10-CM | POA: Diagnosis not present

## 2017-02-12 DIAGNOSIS — Z7982 Long term (current) use of aspirin: Secondary | ICD-10-CM | POA: Diagnosis not present

## 2017-02-12 NOTE — Progress Notes (Signed)
Discharge Progress Report  Patient Details  Name: Denise [name] MRN: [id number] Date of Birth: 14-Feb-1946 Referring Provider:     CARDIAC REHAB PHASE II ORIENTATION from 10/31/2016 in [place]  Referring Provider  Sherren Mocha MD       Number of Visits: 35  Reason for Discharge:  Patient reached a stable level of exercise. Patient independent in their exercise. Patient has met program and personal goals.  Smoking History:  History  Smoking Status  . Former Smoker  Smokeless Tobacco  . Never Used    Comment: quit in 1070    Diagnosis:  09/24/16 S/P TAVR (transcatheter aortic valve replacement)  ADL [facility]SD:   Initial Exercise Prescription:     Initial Exercise Prescription - 10/31/16 1000      Date of Initial Exercise RX and Referring Provider   Date 10/31/16   Referring Provider Sherren Mocha MD     Recumbant Bike   Level 1   Minutes 10   METs 2.2     NuStep   Level 1   SPM 80   Minutes 10   METs 2     Track   Laps 8   Minutes 10   METs 2.4     Prescription Details   Frequency (times per week) 3   Duration Progress to 45 minutes of aerobic exercise without signs/symptoms of physical distress     Intensity   THRR 40-80% of Max Heartrate 60-119   Ratings of Perceived Exertion 11-13   Perceived Dyspnea 0-4     Progression   Progression Continue to progress workloads to maintain intensity without signs/symptoms of physical distress.     Resistance Training   Training Prescription Yes   Weight 2   Reps 10-15      Discharge Exercise Prescription (Final Exercise Prescription Changes):     Exercise Prescription Changes - 02/12/17 1638      Response to Exercise   Blood Pressure (Admit) 150/80   Blood Pressure (Exercise) 148/86   Blood Pressure (Exit) 140/80   Heart Rate (Admit) 83 bpm   Heart Rate (Exercise) 116 bpm   Heart Rate (Exit) 83 bpm   Rating of Perceived Exertion (Exercise) 11    Symptoms none   Duration Continue with 30 min of aerobic exercise without signs/symptoms of physical distress.   Intensity THRR unchanged     Progression   Progression Continue to progress workloads to maintain intensity without signs/symptoms of physical distress.   Average METs 2.3     Resistance Training   Training Prescription No     Arm Ergometer   Level 3   Watts 24   Minutes 15   METs 2.31     Track   Laps 12   Minutes 15   METs 2.3     Home Exercise Plan   Plans to continue exercise at Home (comment)  walking and riding stationary bike   Frequency Add 2 additional days to program exercise sessions.   Initial Home Exercises Provided 11/18/16      Functional Capacity:     6 Minute Walk    Row Name 10/31/16 1013 02/03/17 1625 02/03/17 1626     6 Minute Walk   Phase Initial Discharge  -   Distance 2 feet 1070 feet 1347 feet   Distance % Change  -  - 24.79 %   Distance Feet Change  -  - 277 ft   Walk Time 6 minutes  -  6 minutes   # of Rest Breaks 0  - 0   MPH 2  - 2.55   METS 2.2  - 2.48   RPE 13  - 10   VO2 Peak 7.7  - 8.67   Symptoms No  - No   Resting HR 70 bpm  - 95 bpm   Resting BP 138/84  - 130/80   Max Ex. HR 116 bpm  - 118 bpm   Max Ex. BP 178/90  - 154/76   2 Minute Post BP 118/60  - 120/74      Psychological, QOL, Others - Outcomes: PHQ 2/9: Depression screen [facility] 2/9 02/12/2017 06/26/2016 02/28/2016 12/27/2015 08/23/2015  Decreased Interest 0 0 0 0 0  Down, Depressed, Hopeless 0 0 0 0 0  PHQ - 2 Score 0 0 0 0 0  Some recent data might be hidden    Quality of Life:     Quality of Life - 02/10/17 1456      Quality of Life Scores   Health/Function Pre 25.08 %   Health/Function Post 23.77 %   Health/Function % Change -5.22 %   Socioeconomic Pre 27.07 %   Socioeconomic Post 27.21 %   Socioeconomic % Change  0.52 %   Psych/Spiritual Pre 28.93 %   Psych/Spiritual Post 28.07 %   Psych/Spiritual % Change -2.97 %   Family Pre 24 %    Family Post 24 %   Family % Change 0 %   GLOBAL Pre 26.26 %   GLOBAL Post 25.55 %   GLOBAL % Change -2.7 %      Personal Goals: Goals established at orientation with interventions provided to work toward goal.     Personal Goals and Risk Factors at Admission - 10/31/16 1142      Core Components/Risk Factors/Patient Goals on Admission   Diabetes Yes       Personal Goals Discharge:     Goals and Risk Factor Review    Row Name 11/10/16 2047 12/09/16 1342 01/07/17 2318 02/02/17 1103 02/02/17 1114     Core Components/Risk Factors/Patient Goals Review   Personal Goals Review Weight Management/Obesity;Lipids;Other;Hypertension;Diabetes Weight Management/Obesity;Lipids;Other;Hypertension;Diabetes Weight Management/Obesity;Lipids;Other;Hypertension;Diabetes Weight Management/Obesity;Lipids;Other;Hypertension;Diabetes  -   Review Pt is off to a good start toward reduction of modifiable risk factors Pt is off to a good start toward reduction of modifiable risk factors. 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 Pt is making progress toward reduction of modifiable risk factors. 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 Pt is making progress toward reduction of modifiable risk factors. 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 Pt has periodic elevation in exertional bp. continuing to monitor. Pt is making progress toward reduction of modifiable risk factors. 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 Pt has periodic elevation in exertional bp. Pt with increase in lisinopril to 30 mg dialy.continuing to monitor.   Expected Outcomes  - Pt will display and/or make desired progress toward the desired weight loss, lipid panel readings within normal limtis, Maintenance of HGA1C less than 6.0. Pt will display and/or make desired progress toward the desired weight loss, lipid panel readings within normal limtis,  Maintenance of HGA1C less than 6.0. Pt will display and/or make desired progress toward the desired weight loss, lipid panel readings within normal limtis, Maintenance of HGA1C less than 6.0.  -      Exercise Goals  and Review:     Exercise Goals    Row Name 10/31/16 0743 10/31/16 1016           Exercise Goals   Increase Physical Activity (P)  Yes Yes      Intervention (P)  Provide advice, education, support and counseling about physical activity/exercise needs.;Develop an individualized exercise prescription for aerobic and resistive training based on initial evaluation findings, risk stratification, comorbidities and participant's personal goals. Provide advice, education, support and counseling about physical activity/exercise needs.;Develop an individualized exercise prescription for aerobic and resistive training based on initial evaluation findings, risk stratification, comorbidities and participant's personal goals.      Expected Outcomes (P)  Achievement of increased cardiorespiratory fitness and enhanced flexibility, muscular endurance and strength shown through measurements of functional capacity and personal statement of participant. Achievement of increased cardiorespiratory fitness and enhanced flexibility, muscular endurance and strength shown through measurements of functional capacity and personal statement of participant.      Increase Strength and Stamina (P)  Yes Yes      Intervention (P)  Provide advice, education, support and counseling about physical activity/exercise needs.;Develop an individualized exercise prescription for aerobic and resistive training based on initial evaluation findings, risk stratification, comorbidities and participant's personal goals. Provide advice, education, support and counseling about physical activity/exercise needs.;Develop an individualized exercise prescription for aerobic and resistive training based on initial evaluation findings, risk  stratification, comorbidities and participant's personal goals.      Expected Outcomes (P)  Achievement of increased cardiorespiratory fitness and enhanced flexibility, muscular endurance and strength shown through measurements of functional capacity and personal statement of participant. Achievement of increased cardiorespiratory fitness and enhanced flexibility, muscular endurance and strength shown through measurements of functional capacity and personal statement of participant.         Nutrition & Weight - Outcomes:     Pre Biometrics - 02/03/17 1629      Pre Biometrics   Height 5' 1"  (1.549 m)   Weight 223 lb 8.7 oz (101.4 kg)   Waist Circumference 41.5 inches   Hip Circumference 50.25 inches   Waist to Hip Ratio 0.83 %   BMI (Calculated) 42.26   Triceps Skinfold 40 mm   % Body Fat 52.1 %   Grip Strength 35 kg   Flexibility 11.5 in   Single Leg Stand 4.31 seconds       Nutrition:     Nutrition Therapy & Goals - 11/01/16 0746      Nutrition Therapy   Diet Carb Modified, Therapeutic Lifestyle Changes     Personal Nutrition Goals   Nutrition Goal Wt loss of 1-2 lb/week to a wt loss goal of 6-24 lb at graduation from Cardiac Rehab. Pt long-term wt loss goal wt is 180 lb.      Intervention Plan   Intervention Prescribe, educate and counsel regarding individualized specific dietary modifications aiming towards targeted core components such as weight, hypertension, lipid management, diabetes, heart failure and other comorbidities.   Expected Outcomes Short Term Goal: Understand basic principles of dietary content, such as calories, fat, sodium, cholesterol and nutrients.;Long Term Goal: Adherence to prescribed nutrition plan.      Nutrition Discharge:     Nutrition Assessments - 02/21/17 1050      MEDFICTS Scores   Pre Score 30   Post Score 15   Score Difference -15      Education Questionnaire Score:     Knowledge Questionnaire Score - 02/10/17 1457  Knowledge Questionnaire Score   Post Score 23/24      Goals reviewed with patient. Pt graduated from cardiac rehab program today with completion of 35 exercise sessions in Phase II. Pt maintained good attendance and progressed nicely during his participation in rehab as evidenced by increased MET level.   Medication list reconciled. Repeat  PHQ score- 0.  Pt completed post quality of life survey.  Pt scored the following:     Quality of Life - 02/10/17 1456      Quality of Life Scores   Health/Function Pre 25.08 %   Health/Function Post 23.77 %   Health/Function % Change -5.22 %   Socioeconomic Pre 27.07 %   Socioeconomic Post 27.21 %   Socioeconomic % Change  0.52 %   Psych/Spiritual Pre 28.93 %   Psych/Spiritual Post 28.07 %   Psych/Spiritual % Change -2.97 %   Family Pre 24 %   Family Post 24 %   Family % Change 0 %   GLOBAL Pre 26.26 %   GLOBAL Post 25.55 %   GLOBAL % Change -2.7 %       Pt has made significant lifestyle changes and should be commended for his success. Pt feels she has achieved her goals during cardiac rehab. Pt is excited that she was able to maintain her weight loss from her surgery. Pt has increased balance and stamina.  Pt is planning on going on a cruise in February with her daughter.  Pt feels she has the energy and stamina to go on excursions with the family.   Pt plans to continue exercise in the live strong program for breast cancer survivors at the [facility] at [place].  Pt plans to exercise 3-4 x week through the month of December.  Pt will do aerobic classes, weight lifting and treadmill.  It was a delight to have this patient in the rehab program. [name] RN, BSN Cardiac and Pulmonary Rehab Nurse Navigator

## 2017-03-10 ENCOUNTER — Other Ambulatory Visit: Payer: Self-pay | Admitting: Internal Medicine

## 2017-03-11 ENCOUNTER — Ambulatory Visit (INDEPENDENT_AMBULATORY_CARE_PROVIDER_SITE_OTHER): Payer: Medicare Other | Admitting: Podiatry

## 2017-03-11 ENCOUNTER — Encounter: Payer: Self-pay | Admitting: Podiatry

## 2017-03-11 VITALS — BP 161/83 | HR 72 | Resp 16

## 2017-03-11 DIAGNOSIS — E119 Type 2 diabetes mellitus without complications: Secondary | ICD-10-CM | POA: Diagnosis not present

## 2017-03-11 DIAGNOSIS — D689 Coagulation defect, unspecified: Secondary | ICD-10-CM | POA: Diagnosis not present

## 2017-03-11 DIAGNOSIS — L84 Corns and callosities: Secondary | ICD-10-CM

## 2017-03-11 NOTE — Progress Notes (Signed)
   Subjective:    Patient ID: Denise [name], female    DOB: 1945-07-21, 71 y.o.   MRN: [id number]  HPI Patient says today requesting yearly diabetic foot screen. She denies any history of foot ulceration, claudication or amputation. Patient currently taking Plavix Patient former smoker discontinue 1970 Patient has a history of aortic valve replacement in 2018  Review of Systems  All other systems reviewed and are negative.      Objective:   Physical Exam  Orientated 3  Vascular: No calf pain or calf edema or calf tenderness bilaterally DP pulses 2/4 bilaterally PT pulses 1/4 bilaterally Reflex within normal bilaterally  Neurological: Sensation to 10 g monofilament wire intact 8/8 bilaterally Vibratory sensation reactive bilaterally Ankle reflexes reactive bilaterally  Dermatological No open skin lesions bilaterally Well-organized corns lateral fifth toes bilaterally Toenails are neatly trimmed  Musculoskeletal: HAV bilaterally Hammertoe fifth bilaterally Manual motor testing dorsi flexion, plantar flexion, inversion, eversion 5/5 bilaterally There is no crepitus or restriction in range of motion ankle, subtalar, midtarsal joints bilaterally       Assessment & Plan:   Assessment: Diabetic with protective sensation intact 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 I instructed patient not to use any sharp cutting estimates on the corns. Okay use protective pads Return as needed for debridement of corns and yearly for diabetic foot screen  Reappoint 1 year

## 2017-03-11 NOTE — Patient Instructions (Signed)

## 2017-03-12 ENCOUNTER — Ambulatory Visit: Payer: Medicare Other | Admitting: Podiatry
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
## 2017-03-25 ENCOUNTER — Other Ambulatory Visit: Payer: Self-pay | Admitting: Internal Medicine

## 2017-03-25 ENCOUNTER — Ambulatory Visit (INDEPENDENT_AMBULATORY_CARE_PROVIDER_SITE_OTHER): Payer: Medicare Other | Admitting: Cardiovascular Disease

## 2017-03-25 ENCOUNTER — Encounter: Payer: Self-pay | Admitting: Cardiovascular Disease

## 2017-03-25 VITALS — BP 132/76 | HR 69 | Ht 61.0 in | Wt 226.0 lb

## 2017-03-25 DIAGNOSIS — Z952 Presence of prosthetic heart valve: Secondary | ICD-10-CM

## 2017-03-25 DIAGNOSIS — Z23 Encounter for immunization: Secondary | ICD-10-CM

## 2017-03-25 DIAGNOSIS — E7800 Pure hypercholesterolemia, unspecified: Secondary | ICD-10-CM

## 2017-03-25 DIAGNOSIS — I110 Hypertensive heart disease with heart failure: Secondary | ICD-10-CM | POA: Diagnosis not present

## 2017-03-25 DIAGNOSIS — I350 Nonrheumatic aortic (valve) stenosis: Secondary | ICD-10-CM

## 2017-03-25 DIAGNOSIS — I6529 Occlusion and stenosis of unspecified carotid artery: Secondary | ICD-10-CM | POA: Diagnosis not present

## 2017-03-25 NOTE — Patient Instructions (Addendum)
Medication Instructions:  Your physician recommends that you continue on your current medications as directed. Please refer to the Current Medication list given to you today.  Labwork: NONE  Testing/Procedures: Your physician has requested that you have an echocardiogram. Echocardiography is a painless test that uses sound waves to create images of your heart. It provides your doctor with information about the size and shape of your heart and how well your heart's chambers and valves are working. This procedure takes approximately one hour. There are no restrictions for this procedure. [facility] [address] IN 6 MONTHS PRIOR TO FOLLOW UP APPOINTMENT   Your physician has requested that you have a carotid duplex. This test is an ultrasound of the carotid arteries in your neck. It looks at blood flow through these arteries that supply the brain with blood. Allow one hour for this exam. There are no restrictions or special instructions. IN 6 MONTHS PRIOR TO FOLLOW UP APPOINTMENT   Follow-Up: Your physician wants you to follow-up in: [place] will receive a reminder letter in the mail two months in advance. If you don't receive a letter, please call our office to schedule the follow-up appointment.  Any Other Special Instructions Will Be Listed Below (If Applicable). FLU VACCINE HAS BEEN ADMINISTERED TODAY   Influenza Virus Vaccine (Flucelvax) What is this medicine? INFLUENZA VIRUS VACCINE (in floo EN zuh VAHY ruhs vak SEEN) helps to reduce the risk of getting influenza also known as the flu. The vaccine only helps protect you against some strains of the flu. This medicine may be used for other purposes; ask your health care provider or pharmacist if you have questions. COMMON BRAND NAME(S): FLUCELVAX What should I tell my health care provider before I take this medicine? They need to know if you have any of these conditions: -bleeding disorder like  hemophilia -fever or infection -Guillain-Barre syndrome or other neurological problems -immune system problems -infection with the human immunodeficiency virus (HIV) or AIDS -low blood platelet counts -multiple sclerosis -an unusual or allergic reaction to influenza virus vaccine, other medicines, foods, dyes or preservatives -pregnant or trying to get pregnant -breast-feeding How should I use this medicine? This vaccine is for injection into a muscle. It is given by a health care professional. A copy of Vaccine Information Statements will be given before each vaccination. Read this sheet carefully each time. The sheet may change frequently. Talk to your pediatrician regarding the use of this medicine in children. Special care may be needed. Overdosage: If you think you've taken too much of this medicine contact a poison control center or emergency room at once. Overdosage: If you think you have taken too much of this medicine contact a poison control center or emergency room at once. NOTE: This medicine is only for you. Do not share this medicine with others. What if I miss a dose? This does not apply. What may interact with this medicine? -chemotherapy or radiation therapy -medicines that lower your immune system like etanercept, anakinra, infliximab, and adalimumab -medicines that treat or prevent blood clots like warfarin -phenytoin -steroid medicines like prednisone or cortisone -theophylline -vaccines This list may not describe all possible interactions. Give your health care provider a list of all the medicines, herbs, non-prescription drugs, or dietary supplements you use. Also tell them if you smoke, drink alcohol, or use illegal drugs. Some items may interact with your medicine. What should I watch for while using this medicine? Report any  side effects that do not go away within 3 days to your doctor or health care professional. Call your health care provider if any unusual  symptoms occur within 6 weeks of receiving this vaccine. You may still catch the flu, but the illness is not usually as bad. You cannot get the flu from the vaccine. The vaccine will not protect against colds or other illnesses that may cause fever. The vaccine is needed every year. What side effects may I notice from receiving this medicine? Side effects that you should report to your doctor or health care professional as soon as possible: -allergic reactions like skin rash, itching or hives, swelling of the face, lips, or tongue Side effects that usually do not require medical attention (Report these to your doctor or health care professional if they continue or are bothersome.): -fever -headache -muscle aches and pains -pain, tenderness, redness, or swelling at the injection site -tiredness This list may not describe all possible side effects. Call your doctor for medical advice about side effects. You may report side effects to FDA at 1-800-FDA-1088. Where should I keep my medicine? The vaccine will be given by a health care professional in a clinic, pharmacy, doctor's office, or other health care setting. You will not be given vaccine doses to store at home. NOTE: This sheet is a summary. It may not cover all possible information. If you have questions about this medicine, talk to your doctor, pharmacist, or health care provider.  2018 Elsevier/Gold Standard (2011-03-27 14:06:47)    If you need a refill on your cardiac medications before your next appointment, please call your pharmacy.

## 2017-03-26 ENCOUNTER — Ambulatory Visit: Payer: Self-pay | Admitting: Cardiology

## 2017-03-29 ENCOUNTER — Other Ambulatory Visit: Payer: Self-pay | Admitting: Physician Assistant

## 2017-03-31 ENCOUNTER — Other Ambulatory Visit: Payer: Self-pay

## 2017-03-31 ENCOUNTER — Other Ambulatory Visit: Payer: Self-pay | Admitting: Internal Medicine

## 2017-03-31 MED ORDER — LISINOPRIL 20 MG PO TABS: 20.0000 mg | ORAL_TABLET | Freq: Every day | ORAL | 11 refills | Status: DC

## 2017-04-09 ENCOUNTER — Encounter: Payer: Self-pay | Admitting: Internal Medicine

## 2017-04-09 ENCOUNTER — Other Ambulatory Visit (INDEPENDENT_AMBULATORY_CARE_PROVIDER_SITE_OTHER): Payer: Medicare Other

## 2017-04-09 ENCOUNTER — Ambulatory Visit (INDEPENDENT_AMBULATORY_CARE_PROVIDER_SITE_OTHER): Payer: Medicare Other | Admitting: Internal Medicine

## 2017-04-09 VITALS — BP 148/92 | HR 72 | Temp 98.2°F | Ht 61.0 in | Wt 229.0 lb

## 2017-04-09 DIAGNOSIS — E7800 Pure hypercholesterolemia, unspecified: Secondary | ICD-10-CM | POA: Diagnosis not present

## 2017-04-09 DIAGNOSIS — I10 Essential (primary) hypertension: Secondary | ICD-10-CM

## 2017-04-09 DIAGNOSIS — I6529 Occlusion and stenosis of unspecified carotid artery: Secondary | ICD-10-CM

## 2017-04-09 DIAGNOSIS — E119 Type 2 diabetes mellitus without complications: Secondary | ICD-10-CM

## 2017-04-09 LAB — CBC WITH DIFFERENTIAL/PLATELET
Basophils Absolute: 0.1 10*3/uL (ref 0.0–0.1)
Basophils Relative: 1.2 % (ref 0.0–3.0)
EOS PCT: 1.8 % (ref 0.0–5.0)
Eosinophils Absolute: 0.1 10*3/uL (ref 0.0–0.7)
HCT: 41.3 % (ref 36.0–46.0)
Hemoglobin: 13.4 g/dL (ref 12.0–15.0)
LYMPHS ABS: 2.1 10*3/uL (ref 0.7–4.0)
Lymphocytes Relative: 25.6 % (ref 12.0–46.0)
MCHC: 32.6 g/dL (ref 30.0–36.0)
MCV: 80.8 fl (ref 78.0–100.0)
MONO ABS: 0.7 10*3/uL (ref 0.1–1.0)
Monocytes Relative: 8.5 % (ref 3.0–12.0)
NEUTROS PCT: 62.9 % (ref 43.0–77.0)
Neutro Abs: 5.2 10*3/uL (ref 1.4–7.7)
Platelets: 195 10*3/uL (ref 150.0–400.0)
RBC: 5.11 Mil/uL (ref 3.87–5.11)
RDW: 16.2 % — ABNORMAL HIGH (ref 11.5–15.5)
WBC: 8.3 10*3/uL (ref 4.0–10.5)

## 2017-04-09 LAB — URINALYSIS, ROUTINE W REFLEX MICROSCOPIC
BILIRUBIN URINE: NEGATIVE
HGB URINE DIPSTICK: NEGATIVE
KETONES UR: NEGATIVE
LEUKOCYTES UA: NEGATIVE
NITRITE: NEGATIVE
RBC / HPF: NONE SEEN (ref 0–?)
SPECIFIC GRAVITY, URINE: 1.015 (ref 1.000–1.030)
Total Protein, Urine: NEGATIVE
URINE GLUCOSE: NEGATIVE
UROBILINOGEN UA: 0.2 (ref 0.0–1.0)
pH: 6.5 (ref 5.0–8.0)

## 2017-04-09 LAB — BASIC METABOLIC PANEL
BUN: 10 mg/dL (ref 6–23)
CALCIUM: 9.7 mg/dL (ref 8.4–10.5)
CO2: 29 meq/L (ref 19–32)
Chloride: 104 mEq/L (ref 96–112)
Creatinine, Ser: 0.68 mg/dL (ref 0.40–1.20)
GFR: 109.46 mL/min (ref >60.00)
GLUCOSE: 83 mg/dL (ref 70–99)
POTASSIUM: 3.5 meq/L (ref 3.5–5.1)
SODIUM: 142 meq/L (ref 135–145)

## 2017-04-09 LAB — TSH: TSH: 0.98 u[IU]/mL (ref 0.35–4.50)

## 2017-04-09 LAB — HEPATIC FUNCTION PANEL
ALT: 20 U/L (ref 0–35)
AST: 21 U/L (ref 0–37)
Albumin: 4.5 g/dL (ref 3.5–5.2)
Alkaline Phosphatase: 68 U/L (ref 39–117)
BILIRUBIN TOTAL: 0.8 mg/dL (ref 0.2–1.2)
Bilirubin, Direct: 0.1 mg/dL (ref 0.0–0.3)
Total Protein: 7.7 g/dL (ref 6.0–8.3)

## 2017-04-09 LAB — MICROALBUMIN / CREATININE URINE RATIO
Creatinine,U: 23.8 mg/dL
Microalb Creat Ratio: 2.9 mg/g (ref 0.0–30.0)
Microalb, Ur: <0.7 mg/dL (ref 0.0–1.9)

## 2017-04-09 LAB — LIPID PANEL
CHOL/HDL RATIO: 2
Cholesterol: 169 mg/dL (ref 0–200)
HDL: 79.5 mg/dL (ref >39.00)
LDL CALC: 75 mg/dL (ref 0–99)
NONHDL: 89.75
Triglycerides: 75 mg/dL (ref 0.0–149.0)
VLDL: 15 mg/dL (ref 0.0–40.0)

## 2017-04-09 LAB — HEMOGLOBIN A1C: Hgb A1c MFr Bld: 6.1 % (ref 4.6–6.5)

## 2017-04-09 MED ORDER — ZOSTER VAC RECOMB ADJUVANTED 50 MCG/0.5ML IM SUSR: 0.5000 mL | Freq: Once | INTRAMUSCULAR | 1 refills | Status: AC

## 2017-04-09 NOTE — Assessment & Plan Note (Signed)
stable overall by history and exam, recent data reviewed with pt, and pt to continue medical treatment as before,  to f/u any worsening symptoms or concerns Lab Results  Component Value Date   HGBA1C 5.7 (H) 09/20/2016

## 2017-04-09 NOTE — Patient Instructions (Addendum)
Your shingles shot was sent to [facility]  Please continue all other medications as before, and refills have been done if requested.  Please have the pharmacy call with any other refills you may need.  Please continue your efforts at being more active, low cholesterol diet, and weight control.  You are otherwise up to date with prevention measures today.  Please keep your appointments with your specialists as you may have planned  Please go to the LAB in the Basement (turn left off the elevator) for the tests to be done today  You will be contacted by phone if any changes need to be made immediately.  Otherwise, you will receive a letter about your results with an explanation, but please check with MyChart first.  Please remember to sign up for MyChart if you have not done so, as this will be important to you in the future with finding out test results, communicating by private email, and scheduling acute appointments online when needed.  Please return in 6 months, or sooner if needed

## 2017-04-09 NOTE — Assessment & Plan Note (Signed)
Mild elevated, o/w stable overall by history and exam, recent data reviewed with pt, and pt to continue medical treatment as before as declines further change in tx today,  to f/u any worsening symptoms or concerns
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
## 2017-04-09 NOTE — Assessment & Plan Note (Signed)
stable overall by history and exam, recent data reviewed with pt, and pt to continue medical treatment as before,  to f/u any worsening symptoms or concerns Lab Results  Component Value Date   [place] 56 01/24/2017  for f/u lab today

## 2017-05-17 ENCOUNTER — Other Ambulatory Visit: Payer: Self-pay | Admitting: Internal Medicine

## 2017-05-26 ENCOUNTER — Ambulatory Visit (INDEPENDENT_AMBULATORY_CARE_PROVIDER_SITE_OTHER): Payer: Medicare Other | Admitting: Family

## 2017-05-26 ENCOUNTER — Encounter: Payer: Self-pay | Admitting: Family

## 2017-05-26 VITALS — BP 162/88 | HR 78 | Temp 98.8°F | Wt 221.0 lb

## 2017-05-26 DIAGNOSIS — J45909 Unspecified asthma, uncomplicated: Secondary | ICD-10-CM | POA: Diagnosis not present

## 2017-05-26 MED ORDER — PREDNISONE 20 MG PO TABS: 20.0000 mg | ORAL_TABLET | Freq: Every day | ORAL | 0 refills | Status: DC

## 2017-05-26 MED ORDER — AMOXICILLIN-POT CLAVULANATE 875-125 MG PO TABS: 1.0000 | ORAL_TABLET | Freq: Two times a day (BID) | ORAL | 0 refills | Status: DC

## 2017-05-26 MED ORDER — ALBUTEROL SULFATE (2.5 MG/3ML) 0.083% IN NEBU
2.5000 mg | INHALATION_SOLUTION | Freq: Once | RESPIRATORY_TRACT | Status: DC
Start: 2017-05-26 — End: 2020-07-26
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
## 2017-06-08 ENCOUNTER — Other Ambulatory Visit: Payer: Self-pay | Admitting: Internal Medicine

## 2017-07-01 ENCOUNTER — Other Ambulatory Visit: Payer: Self-pay | Admitting: Internal Medicine

## 2017-07-01 ENCOUNTER — Other Ambulatory Visit: Payer: Self-pay | Admitting: Cardiovascular Disease

## 2017-07-01 DIAGNOSIS — I110 Hypertensive heart disease with heart failure: Secondary | ICD-10-CM

## 2017-07-01 DIAGNOSIS — E7800 Pure hypercholesterolemia, unspecified: Secondary | ICD-10-CM

## 2017-07-01 DIAGNOSIS — Z5181 Encounter for therapeutic drug level monitoring: Secondary | ICD-10-CM

## 2017-07-01 NOTE — Telephone Encounter (Signed)
Refill Request.  

## 2017-07-03 ENCOUNTER — Other Ambulatory Visit: Payer: Self-pay | Admitting: Internal Medicine

## 2017-07-03 ENCOUNTER — Other Ambulatory Visit: Payer: Self-pay | Admitting: Cardiovascular Disease

## 2017-07-03 DIAGNOSIS — Z5181 Encounter for therapeutic drug level monitoring: Secondary | ICD-10-CM

## 2017-07-03 DIAGNOSIS — E7800 Pure hypercholesterolemia, unspecified: Secondary | ICD-10-CM

## 2017-07-03 DIAGNOSIS — I110 Hypertensive heart disease with heart failure: Secondary | ICD-10-CM

## 2017-07-03 NOTE — Telephone Encounter (Signed)
REFILL 

## 2017-07-03 NOTE — Telephone Encounter (Signed)
Refill request

## 2017-07-14 DIAGNOSIS — C50911 Malignant neoplasm of unspecified site of right female breast: Secondary | ICD-10-CM | POA: Diagnosis not present

## 2017-08-29 ENCOUNTER — Telehealth: Payer: Self-pay

## 2017-08-29 NOTE — Telephone Encounter (Signed)
TAVR completed 5/29. Patient has echo scheduled 5/20 and appointment with Dr. Oval Linsey 5/29.  Called to arrange appointment with the Structural Heart Team. Left message to call back.

## 2017-09-01 NOTE — Telephone Encounter (Signed)
Left message to call back  

## 2017-09-02 NOTE — Telephone Encounter (Signed)
TAVR completed 5/29.  Scheduled patient for one year TAVR visit with Nell Range, PA 10/15/17. Echo is already scheduled 5/20. Patient agrees with treatment plan.

## 2017-09-15 ENCOUNTER — Ambulatory Visit (HOSPITAL_COMMUNITY)
Admission: RE | Admit: 2017-09-15 | Discharge: 2017-09-15 | Disposition: A | Discharge status: Home / Self Care | Payer: Medicare Other | Source: Ambulatory Visit | Attending: Cardiovascular Disease | Admitting: Cardiovascular Disease

## 2017-09-15 ENCOUNTER — Ambulatory Visit (HOSPITAL_BASED_OUTPATIENT_CLINIC_OR_DEPARTMENT_OTHER): Payer: Medicare Other

## 2017-09-15 ENCOUNTER — Other Ambulatory Visit: Payer: Self-pay

## 2017-09-15 DIAGNOSIS — I350 Nonrheumatic aortic (valve) stenosis: Secondary | ICD-10-CM | POA: Insufficient documentation

## 2017-09-15 DIAGNOSIS — Z952 Presence of prosthetic heart valve: Secondary | ICD-10-CM | POA: Insufficient documentation

## 2017-09-15 DIAGNOSIS — Z853 Personal history of malignant neoplasm of breast: Secondary | ICD-10-CM | POA: Diagnosis not present

## 2017-09-15 DIAGNOSIS — E119 Type 2 diabetes mellitus without complications: Secondary | ICD-10-CM | POA: Insufficient documentation

## 2017-09-15 DIAGNOSIS — E785 Hyperlipidemia, unspecified: Secondary | ICD-10-CM | POA: Diagnosis not present

## 2017-09-15 DIAGNOSIS — I6529 Occlusion and stenosis of unspecified carotid artery: Secondary | ICD-10-CM

## 2017-09-17 ENCOUNTER — Encounter: Payer: Self-pay | Admitting: Gastroenterology

## 2017-09-24 ENCOUNTER — Encounter: Payer: Self-pay | Admitting: Cardiovascular Disease

## 2017-09-24 ENCOUNTER — Ambulatory Visit (INDEPENDENT_AMBULATORY_CARE_PROVIDER_SITE_OTHER): Payer: Medicare Other | Admitting: Cardiovascular Disease

## 2017-09-24 VITALS — BP 136/74 | HR 69 | Ht 61.0 in | Wt 235.6 lb

## 2017-09-24 DIAGNOSIS — I350 Nonrheumatic aortic (valve) stenosis: Secondary | ICD-10-CM | POA: Diagnosis not present

## 2017-09-24 DIAGNOSIS — I6529 Occlusion and stenosis of unspecified carotid artery: Secondary | ICD-10-CM | POA: Diagnosis not present

## 2017-09-24 DIAGNOSIS — Z5181 Encounter for therapeutic drug level monitoring: Secondary | ICD-10-CM

## 2017-09-24 DIAGNOSIS — E7800 Pure hypercholesterolemia, unspecified: Secondary | ICD-10-CM

## 2017-09-24 DIAGNOSIS — I110 Hypertensive heart disease with heart failure: Secondary | ICD-10-CM | POA: Diagnosis not present

## 2017-09-24 MED ORDER — LISINOPRIL 40 MG PO TABS: 40.0000 mg | ORAL_TABLET | Freq: Every day | ORAL | 3 refills | Status: DC

## 2017-09-24 NOTE — Addendum Note (Signed)
Addended by: Alvina Filbert B on: 09/24/2017 12:11 PM   Modules accepted: Orders

## 2017-09-24 NOTE — Patient Instructions (Signed)
Medication Instructions:  INCREASE YOUR LISINOPRIL TO 40 MG DAILY   STOP CLOPIDOGREL (PLAVIX)  Labwork: BMET ON 10/15/17 AT YOUR APPOINTMENT WITH Kathlene November, PA   Testing/Procedures: NONE  Follow-Up: Your physician wants you to follow-up in: [place] will receive a reminder letter in the mail two months in advance. If you don't receive a letter, please call our office to schedule the follow-up appointment.  If you need a refill on your cardiac medications before your next appointment, please call your pharmacy.
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
## 2017-10-03 ENCOUNTER — Other Ambulatory Visit: Payer: Self-pay | Admitting: Internal Medicine

## 2017-10-08 ENCOUNTER — Ambulatory Visit: Payer: Self-pay | Admitting: Internal Medicine

## 2017-10-14 ENCOUNTER — Encounter: Payer: Self-pay | Admitting: Internal Medicine

## 2017-10-14 ENCOUNTER — Ambulatory Visit (INDEPENDENT_AMBULATORY_CARE_PROVIDER_SITE_OTHER): Payer: Medicare Other | Admitting: Internal Medicine

## 2017-10-14 ENCOUNTER — Other Ambulatory Visit: Payer: Self-pay | Admitting: Internal Medicine

## 2017-10-14 ENCOUNTER — Other Ambulatory Visit (INDEPENDENT_AMBULATORY_CARE_PROVIDER_SITE_OTHER): Payer: Medicare Other

## 2017-10-14 VITALS — BP 142/88 | HR 77 | Temp 98.2°F | Ht 61.0 in | Wt 234.0 lb

## 2017-10-14 DIAGNOSIS — J019 Acute sinusitis, unspecified: Secondary | ICD-10-CM | POA: Diagnosis not present

## 2017-10-14 DIAGNOSIS — E119 Type 2 diabetes mellitus without complications: Secondary | ICD-10-CM

## 2017-10-14 DIAGNOSIS — I6529 Occlusion and stenosis of unspecified carotid artery: Secondary | ICD-10-CM | POA: Diagnosis not present

## 2017-10-14 DIAGNOSIS — Z1211 Encounter for screening for malignant neoplasm of colon: Secondary | ICD-10-CM

## 2017-10-14 DIAGNOSIS — J309 Allergic rhinitis, unspecified: Secondary | ICD-10-CM

## 2017-10-14 LAB — HEPATIC FUNCTION PANEL
ALBUMIN: 4.5 g/dL (ref 3.5–5.2)
ALK PHOS: 58 U/L (ref 39–117)
ALT: 22 U/L (ref 0–35)
AST: 20 U/L (ref 0–37)
BILIRUBIN DIRECT: 0.2 mg/dL (ref 0.0–0.3)
Total Bilirubin: 0.9 mg/dL (ref 0.2–1.2)
Total Protein: 7.6 g/dL (ref 6.0–8.3)

## 2017-10-14 LAB — BASIC METABOLIC PANEL
BUN: 11 mg/dL (ref 6–23)
CALCIUM: 9.9 mg/dL (ref 8.4–10.5)
CO2: 30 mEq/L (ref 19–32)
CREATININE: 0.66 mg/dL (ref 0.40–1.20)
Chloride: 105 mEq/L (ref 96–112)
GFR: 113.13 mL/min (ref >60.00)
Glucose, Bld: 97 mg/dL (ref 70–99)
POTASSIUM: 3.7 meq/L (ref 3.5–5.1)
Sodium: 142 mEq/L (ref 135–145)

## 2017-10-14 LAB — HEMOGLOBIN A1C: HEMOGLOBIN A1C: 6.7 % — AB (ref 4.6–6.5)

## 2017-10-14 LAB — LIPID PANEL
Cholesterol: 177 mg/dL (ref 0–200)
HDL: 77 mg/dL (ref >39.00)
LDL Cholesterol: 78 mg/dL (ref 0–99)
NONHDL: 99.94
Total CHOL/HDL Ratio: 2
Triglycerides: 109 mg/dL (ref 0.0–149.0)
VLDL: 21.8 mg/dL (ref 0.0–40.0)

## 2017-10-14 MED ORDER — CETIRIZINE HCL 10 MG PO TABS: ORAL_TABLET | ORAL | 3 refills | Status: DC

## 2017-10-14 MED ORDER — DOXYCYCLINE HYCLATE 100 MG PO TABS: 100.0000 mg | ORAL_TABLET | Freq: Two times a day (BID) | ORAL | 0 refills | Status: DC
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
## 2017-10-14 NOTE — Assessment & Plan Note (Signed)
Mild, to restart zyrtec asd

## 2017-10-14 NOTE — Assessment & Plan Note (Signed)
Mild to mod, for antibx course,  to f/u any worsening symptoms or concerns 

## 2017-10-14 NOTE — Assessment & Plan Note (Signed)
stable overall by history and exam, recent data reviewed with pt, and pt to continue medical treatment as before,  to f/u any worsening symptoms or concerns Lab Results  Component Value Date   HGBA1C 6.1 04/09/2017

## 2017-10-14 NOTE — Patient Instructions (Addendum)
You will be contacted regarding the referral for: colonoscopy  Please take all new medication as prescribed  - the antibiotic  Please continue all other medications as before, and refills have been done if requested - the zyrtec  Please have the pharmacy call with any other refills you may need.  Please continue your efforts at being more active, low cholesterol diet, and weight control.  Please keep your appointments with your specialists as you may have planned  Please go to the LAB in the Basement (turn left off the elevator) for the tests to be done today  You will be contacted by phone if any changes need to be made immediately.  Otherwise, you will receive a letter about your results with an explanation, but please check with MyChart first.  Please remember to sign up for MyChart if you have not done so, as this will be important to you in the future with finding out test results, communicating by private email, and scheduling acute appointments online when needed.  Please return in 6 months, or sooner if needed
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
## 2017-10-15 ENCOUNTER — Ambulatory Visit (INDEPENDENT_AMBULATORY_CARE_PROVIDER_SITE_OTHER): Payer: Medicare Other | Admitting: Physician Assistant

## 2017-10-15 ENCOUNTER — Encounter: Payer: Self-pay | Admitting: Physician Assistant

## 2017-10-15 ENCOUNTER — Other Ambulatory Visit: Payer: Medicare Other | Admitting: *Deleted

## 2017-10-15 VITALS — BP 126/88 | HR 82 | Ht 61.0 in | Wt 232.1 lb

## 2017-10-15 DIAGNOSIS — I10 Essential (primary) hypertension: Secondary | ICD-10-CM | POA: Diagnosis not present

## 2017-10-15 DIAGNOSIS — I6529 Occlusion and stenosis of unspecified carotid artery: Secondary | ICD-10-CM

## 2017-10-15 DIAGNOSIS — Z952 Presence of prosthetic heart valve: Secondary | ICD-10-CM | POA: Diagnosis not present

## 2017-10-15 DIAGNOSIS — E7800 Pure hypercholesterolemia, unspecified: Secondary | ICD-10-CM

## 2017-10-15 NOTE — Patient Instructions (Signed)
Medication Instructions:  Your physician recommends that you continue on your current medications as directed. Please refer to the Current Medication list given to you today.   Labwork: none  Testing/Procedures: none  Follow-Up: Follow up with Dr. Oval Linsey as planned  Any Other Special Instructions Will Be Listed Below (If Applicable).     If you need a refill on your cardiac medications before your next appointment, please call your pharmacy.

## 2017-10-22 DIAGNOSIS — M79645 Pain in left finger(s): Secondary | ICD-10-CM | POA: Diagnosis not present

## 2017-10-30 ENCOUNTER — Other Ambulatory Visit: Payer: Self-pay | Admitting: Internal Medicine

## 2017-11-07 ENCOUNTER — Telehealth: Payer: Self-pay | Admitting: Hematology and Oncology

## 2017-11-07 ENCOUNTER — Inpatient Hospital Stay: Payer: Medicare Other | Attending: Hematology and Oncology | Admitting: Hematology and Oncology

## 2017-11-07 DIAGNOSIS — C50411 Malignant neoplasm of upper-outer quadrant of right female breast: Secondary | ICD-10-CM

## 2017-11-07 DIAGNOSIS — Z79899 Other long term (current) drug therapy: Secondary | ICD-10-CM | POA: Diagnosis not present

## 2017-11-07 DIAGNOSIS — Z923 Personal history of irradiation: Secondary | ICD-10-CM

## 2017-11-07 DIAGNOSIS — Z170 Estrogen receptor positive status [ER+]: Secondary | ICD-10-CM | POA: Diagnosis not present

## 2017-11-07 DIAGNOSIS — Z79811 Long term (current) use of aromatase inhibitors: Secondary | ICD-10-CM | POA: Diagnosis not present

## 2017-11-07 DIAGNOSIS — Z7982 Long term (current) use of aspirin: Secondary | ICD-10-CM

## 2017-11-07 DIAGNOSIS — Z7984 Long term (current) use of oral hypoglycemic drugs: Secondary | ICD-10-CM | POA: Diagnosis not present

## 2017-11-07 NOTE — Assessment & Plan Note (Signed)
Right lumpectomy 10/16/2068: IDC grade 2, 1.2 cm, intermediate grade DCIS, margins negative although less than 0.1 cm to medial margin and superior margin, 0/1 lymph node negative, ER 100%, PR 90%, HER-2 negative ratio 1.37, T1 cN0 stage IA Oncotype DX score 15: Risk of recurrence 9% Adjuvant radiation: 11/27/2015- 01/15/2016  Current treatment: Anastrozole 1 mg daily started 02/08/2016  Anastrozole toxicities: Denies any hot flashes or myalgias  Breast cancer surveillance:  1.  Breast exam 11/07/2017: Benign 2. Mammogram 11/21/2016: Breast density category B postsurgical and postradiation changes and large postoperative seroma  Return to clinic in 1 year for follow-up

## 2017-11-07 NOTE — Progress Notes (Signed)
Patient Care Team: Biagio Borg, MD as PCP - General [name], [name], NP as Nurse Practitioner (Hematology and Oncology)  DIAGNOSIS:  Encounter Diagnosis  Name Primary?  . Malignant neoplasm of upper-outer quadrant of right breast in female, estrogen receptor positive ([place])     SUMMARY OF ONCOLOGIC HISTORY:   Breast cancer of upper-outer quadrant of right female breast ([place])   09/18/2015 Initial Diagnosis    Screening right breast mass 9:00: 1 x 0.9 x 0.7 cm: Grade 2 IDC plus DCIS ER 100%, PR 90%, HER-2 negative ratio 1.38, Ki-67 10%; UOQ lesion 3 mm calcification plus distortion: Bx rec; T1 BN 0 stage IA clinical stage      10/17/2015 Surgery    Right lumpectomy ([facility]): IDC grade 2, 1.2 cm, intermediate grade DCIS, margins neg although < 0.1 cm to medial and superior margins, 0/1 LN negative, ER 100%, PR 90%, HER-2 negative ratio 1.37, T1 cN0 stage IA, Oncotype DX 15, 9% ROR, low risk      11/27/2015 - 01/15/2016 Radiation Therapy    Adjuvant radiation therapy [name]). Right breast: 50.4 Gy in 28 fractions. Right breast boost: 14 Gy in 7 fractions.       02/08/2016 -  Anti-estrogen oral therapy    Anastrozole 1 mg daily. Planned duration of therapy: 5 years.        CHIEF COMPLIANT: Follow-up on anastrozole therapy  INTERVAL HISTORY: Denise [name] is a 72 year old with above-mentioned history of right breast cancer treated with lumpectomy radiation is currently on anastrozole therapy.  She is tolerating it fairly well.  Her only complaint is some itching of the skin which we do not know where it is coming from.  It could be from dryness of the hands.  Denies any hot flashes or myalgias.  She continues to have problems with seroma in the breast that needs to be drained periodically.  REVIEW OF SYSTEMS:   Constitutional: Denies fevers, chills or abnormal weight loss Eyes: Denies blurriness of vision Ears, nose, mouth, throat, and face: Denies mucositis or  sore throat Respiratory: Denies cough, dyspnea or wheezes Cardiovascular: Denies palpitation, chest discomfort Gastrointestinal:  Denies nausea, heartburn or change in bowel habits Skin: Denies abnormal skin rashes Lymphatics: Denies new lymphadenopathy or easy bruising Neurological:Denies numbness, tingling or new weaknesses Behavioral/Psych: Mood is stable, no new changes  Extremities: No lower extremity edema Breast: Right breast seroma All other systems were reviewed with the patient and are negative.  I have reviewed the past medical history, past surgical history, social history and family history with the patient and they are unchanged from previous note.  ALLERGIES:  is allergic to clonidine hydrochloride; erythromycin; hydrocodone-acetaminophen; pork-derived products; and rosiglitazone maleate.  MEDICATIONS:  Current Outpatient Medications  Medication Sig Dispense Refill  . albuterol (PROVENTIL HFA;VENTOLIN HFA) 108 (90 Base) MCG/ACT inhaler Inhale 2 puffs into the lungs every 6 (six) hours as needed for wheezing or shortness of breath. 1 Inhaler 11  . amLODipine (NORVASC) 10 MG tablet TAKE 1 TABLET BY MOUTH ONCE DAILY 90 tablet 0  . amoxicillin (AMOXIL) 500 MG capsule Take 2,000 mg by mouth as directed.     Marland Kitchen anastrozole (ARIMIDEX) 1 MG tablet Take 1 tablet (1 mg total) by mouth daily. 90 tablet 3  . aspirin 81 MG EC tablet Take 81 mg by mouth daily.      Marland Kitchen atorvastatin (LIPITOR) 40 MG tablet TAKE ONE TABLET BY MOUTH ONCE DAILY 90 tablet 1  . Calcium-Magnesium-Vitamin D (CALCIUM 1200+D3 PO)  Take 1 tablet by mouth daily.    . cetirizine (EQ ALLERGY RELIEF, CETIRIZINE,) 10 MG tablet TAKE 1 TABLET BY MOUTH ONCE DAILY AS NEEDED FOR  ALLERGIES 90 tablet 3  . doxycycline (VIBRA-TABS) 100 MG tablet Take 1 tablet (100 mg total) by mouth 2 (two) times daily. 20 tablet 0  . fluticasone (FLONASE) 50 MCG/ACT nasal spray Place 2 sprays into both nostrils daily. 16 g 5  . furosemide (LASIX)  40 MG tablet TAKE 1 TABLET BY MOUTH TWICE DAILY 180 tablet 1  . lisinopril (PRINIVIL,ZESTRIL) 40 MG tablet Take 1 tablet (40 mg total) by mouth daily. 90 tablet 3  . metFORMIN (GLUCOPHAGE-XR) 500 MG 24 hr tablet Take 2,000 mg by mouth daily.    . Multiple Vitamins-Minerals (ALIVE WOMENS 50+) TABS Take 1 tablet by mouth daily.    . Omega-3 Fatty Acids (FISH OIL) 1000 MG CAPS Take 1,000 mg by mouth daily.    Vladimir Faster Glycol-Propyl Glycol (SYSTANE ULTRA OP) Apply 1 drop to eye daily as needed (dry eyes).    . potassium chloride (MICRO-K) 10 MEQ CR capsule TAKE 4 CAPSULES BY MOUTH ONCE DAILY 360 capsule 1  . SYMBICORT 160-4.5 MCG/ACT inhaler INHALE 2 PUFFS BY MOUTH TWICE DAILY 3 Inhaler 3  . tiZANidine (ZANAFLEX) 4 MG tablet Take 4 mg by mouth 2 (two) times daily as needed for muscle spasms.    . traMADol (ULTRAM) 50 MG tablet Take 1-2 tablets (50-100 mg total) by mouth every 4 (four) hours as needed for moderate pain. 30 tablet 0   Current Facility-Administered Medications  Medication Dose Route Frequency Provider Last Rate Last Dose  . albuterol (PROVENTIL) (2.5 MG/3ML) 0.083% nebulizer solution 2.5 mg  2.5 mg Nebulization Once Marrian Salvage, FNP        PHYSICAL EXAMINATION: ECOG PERFORMANCE STATUS: 1 - Symptomatic but completely ambulatory  Vitals:   11/07/17 0758  BP: (!) 151/66  Pulse: 65  Resp: 17  Temp: 98.6 F (37 C)  SpO2: 99%   Filed Weights   11/07/17 0758  Weight: 233 lb (105.7 kg)    GENERAL:alert, no distress and comfortable SKIN: skin color, texture, turgor are normal, no rashes or significant lesions EYES: normal, Conjunctiva are pink and non-injected, sclera clear OROPHARYNX:no exudate, no erythema and lips, buccal mucosa, and tongue normal  NECK: supple, thyroid normal size, non-tender, without nodularity LYMPH:  no palpable lymphadenopathy in the cervical, axillary or inguinal LUNGS: clear to auscultation and percussion with normal breathing  effort HEART: regular rate & rhythm and no murmurs and no lower extremity edema ABDOMEN:abdomen soft, non-tender and normal bowel sounds MUSCULOSKELETAL:no cyanosis of digits and no clubbing  NEURO: alert & oriented x 3 with fluent speech, no focal motor/sensory deficits EXTREMITIES: No lower extremity edema BREAST: Right breast slight fullness from the seroma. No palpable axillary supraclavicular or infraclavicular adenopathy no breast tenderness or nipple discharge. (exam performed in the presence of a chaperone)  LABORATORY DATA:  I have reviewed the data as listed CMP Latest Ref Rng & Units 10/14/2017 04/09/2017 01/24/2017  Glucose 70 - 99 mg/dL 97 83 93  BUN 6 - 23 mg/dL _0 Creatinine 0.40 - 1.20 mg/dL 0.66 0.68 0.79  Sodium 135 - 145 mEq/L 142 142 142  Potassium 3.5 - 5.1 mEq/L 3.7 3.5 4.3  Chloride 96 - 112 mEq/L 105 104 102  CO2 19 - 32 mEq/L _1 Calcium 8.4 - 10.5 mg/dL 9.9 9.7 9.8  Total Protein 6.0 -  8.3 g/dL 7.6 7.7 7.1  Total Bilirubin 0.2 - 1.2 mg/dL 0.9 0.8 0.6  Alkaline Phos 39 - 117 U/L 58 68 73  AST 0 - 37 U/L _0 ALT 0 - 35 U/L _1 Lab Results  Component Value Date   WBC 8.3 04/09/2017   HGB 13.4 04/09/2017   HCT 41.3 04/09/2017   MCV 80.8 04/09/2017   PLT 195.0 04/09/2017   NEUTROABS 5.2 04/09/2017    ASSESSMENT & PLAN:  Breast cancer of upper-outer quadrant of right female breast ([place]) Right lumpectomy 10/16/2068: IDC grade 2, 1.2 cm, intermediate grade DCIS, margins negative although less than 0.1 cm to medial margin and superior margin, 0/1 lymph node negative, ER 100%, PR 90%, HER-2 negative ratio 1.37, T1 cN0 stage IA Oncotype DX score 15: Risk of recurrence 9% Adjuvant radiation: 11/27/2015- 01/15/2016  Current treatment: Anastrozole 1 mg daily started 02/08/2016  Anastrozole toxicities: Denies any hot flashes or myalgias  Breast cancer surveillance:  1.  Breast exam 11/07/2017: Benign 2. Mammogram 11/21/2016: Breast  density category B postsurgical and postradiation changes and large postoperative seroma  Return to clinic in 1 year for follow-up      No orders of the defined types were placed in this encounter.  The patient has a good understanding of the overall plan. she agrees with it. she will call with any problems that may develop before the next visit here.   Harriette Ohara, MD 11/07/17

## 2017-11-07 NOTE — Telephone Encounter (Signed)
Gave patient avs and calendar of upcoming July 2020 appts.  °

## 2017-11-10 ENCOUNTER — Other Ambulatory Visit: Payer: Self-pay | Admitting: Internal Medicine

## 2017-11-19 DIAGNOSIS — M6530 Trigger finger, unspecified finger: Secondary | ICD-10-CM | POA: Insufficient documentation

## 2017-11-19 DIAGNOSIS — M1812 Unilateral primary osteoarthritis of first carpometacarpal joint, left hand: Secondary | ICD-10-CM | POA: Diagnosis not present

## 2017-11-19 DIAGNOSIS — M79645 Pain in left finger(s): Secondary | ICD-10-CM | POA: Diagnosis not present

## 2017-11-19 DIAGNOSIS — M65332 Trigger finger, left middle finger: Secondary | ICD-10-CM | POA: Diagnosis not present

## 2017-11-25 ENCOUNTER — Other Ambulatory Visit: Payer: Self-pay | Admitting: Internal Medicine

## 2017-11-27 HISTORY — PX: BREAST CYST ASPIRATION: SHX578

## 2017-12-10 ENCOUNTER — Ambulatory Visit: Payer: Self-pay | Admitting: *Deleted

## 2017-12-10 ENCOUNTER — Other Ambulatory Visit: Payer: Self-pay

## 2017-12-10 ENCOUNTER — Encounter (HOSPITAL_COMMUNITY): Payer: Self-pay | Admitting: Emergency Medicine

## 2017-12-10 ENCOUNTER — Encounter: Payer: Self-pay | Admitting: Thoracic Surgery (Cardiothoracic Vascular Surgery)

## 2017-12-10 ENCOUNTER — Emergency Department (HOSPITAL_COMMUNITY)
Admission: EM | Admit: 2017-12-10 | Discharge: 2017-12-10 | Disposition: A | Discharge status: Home / Self Care | Payer: Medicare Other | Attending: Emergency Medicine | Admitting: Emergency Medicine

## 2017-12-10 ENCOUNTER — Emergency Department (HOSPITAL_COMMUNITY): Payer: Medicare Other

## 2017-12-10 DIAGNOSIS — Z7984 Long term (current) use of oral hypoglycemic drugs: Secondary | ICD-10-CM | POA: Insufficient documentation

## 2017-12-10 DIAGNOSIS — Z853 Personal history of malignant neoplasm of breast: Secondary | ICD-10-CM | POA: Insufficient documentation

## 2017-12-10 DIAGNOSIS — Z87891 Personal history of nicotine dependence: Secondary | ICD-10-CM | POA: Diagnosis not present

## 2017-12-10 DIAGNOSIS — R0602 Shortness of breath: Secondary | ICD-10-CM | POA: Diagnosis not present

## 2017-12-10 DIAGNOSIS — J45909 Unspecified asthma, uncomplicated: Secondary | ICD-10-CM | POA: Insufficient documentation

## 2017-12-10 DIAGNOSIS — I10 Essential (primary) hypertension: Secondary | ICD-10-CM | POA: Insufficient documentation

## 2017-12-10 DIAGNOSIS — E119 Type 2 diabetes mellitus without complications: Secondary | ICD-10-CM | POA: Insufficient documentation

## 2017-12-10 DIAGNOSIS — R0781 Pleurodynia: Secondary | ICD-10-CM | POA: Diagnosis not present

## 2017-12-10 DIAGNOSIS — R0789 Other chest pain: Secondary | ICD-10-CM | POA: Insufficient documentation

## 2017-12-10 LAB — I-STAT TROPONIN, ED: Troponin i, poc: 0 ng/mL (ref 0.00–0.08)

## 2017-12-10 LAB — CBC
HEMATOCRIT: 39.9 % (ref 36.0–46.0)
HEMOGLOBIN: 12.6 g/dL (ref 12.0–15.0)
MCH: 26.5 pg (ref 26.0–34.0)
MCHC: 31.6 g/dL (ref 30.0–36.0)
MCV: 84 fL (ref 78.0–100.0)
Platelets: 168 10*3/uL (ref 150–400)
RBC: 4.75 MIL/uL (ref 3.87–5.11)
RDW: 15.3 % (ref 11.5–15.5)
WBC: 9.4 10*3/uL (ref 4.0–10.5)

## 2017-12-10 LAB — BASIC METABOLIC PANEL
ANION GAP: 7 (ref 5–15)
BUN: 10 mg/dL (ref 8–23)
CHLORIDE: 108 mmol/L (ref 98–111)
CO2: 26 mmol/L (ref 22–32)
Calcium: 9.4 mg/dL (ref 8.9–10.3)
Creatinine, Ser: 0.73 mg/dL (ref 0.44–1.00)
GFR calc non Af Amer: >60 mL/min (ref >60)
Glucose, Bld: 130 mg/dL — ABNORMAL HIGH (ref 70–99)
POTASSIUM: 4 mmol/L (ref 3.5–5.1)
Sodium: 141 mmol/L (ref 135–145)

## 2017-12-10 LAB — D-DIMER, QUANTITATIVE (NOT AT ARMC)

## 2017-12-10 MED ORDER — IOPAMIDOL (ISOVUE-370) INJECTION 76%
100.0000 mL | Freq: Once | INTRAVENOUS | Status: AC | PRN
  Administered 2017-12-10: 100 mL via INTRAVENOUS

## 2017-12-10 MED ORDER — IOPAMIDOL (ISOVUE-370) INJECTION 76%
INTRAVENOUS | Status: AC
  Filled 2017-12-10: qty 100

## 2017-12-10 NOTE — ED Provider Notes (Signed)
  Face-to-face evaluation   History: Patient here for evaluation of bilateral mid back pain radiating to the anterior chest.  Onset of symptoms last evening after flying here from [facility].  Physical exam: Alert, calm, cooperative.  No dyspnea.  No stridor.  She is lucid.  Medical screening examination/treatment/procedure(s) were conducted as a shared visit with non-physician practitioner(s) and myself.  I personally evaluated the patient during the encounter   Daleen Bo, MD 12/10/17 2344

## 2017-12-10 NOTE — Telephone Encounter (Signed)
Pt reports right sided chest pain "Under right breast." States radiates to back and worse with inspiration. States sudden onset at 5am this morning. Pt had returned from prolonged flight from [place] last evening. States "A lot of driving too." Reports used inhalers this am, ineffective. Pain is 4/10. Also worsens with "Leaning over." States had  "Cold symptoms" 7/31, subsided while traveling, "Allergies."  Pt directed to ED due to prolonged travel/flight and worsening on inspiration. Pt states will follow disposition. Reason for Disposition . Recent long-distance travel with prolonged time in car, bus, plane, or train (i.e., within past 2 weeks; 6 or  more hours duration)  Answer Assessment - Initial Assessment Questions 1. LOCATION: "Where does it hurt?"       Right lower chest area, under breast 2. RADIATION: "Does the pain go anywhere else?" (e.g., into neck, jaw, arms, back)     To back 3. ONSET: "When did the chest pain begin?" (Minutes, hours or days)      0500 this am 4. PATTERN "Does the pain come and go, or has it been constant since it started?"  "Does it get worse with exertion?"     "Better with sitting, worse with leaning up and inspiration" 5. DURATION: "How long does it last" (e.g., seconds, minutes, hours)     Intermittent 6. SEVERITY: "How bad is the pain?"  (e.g., Scale 1-10; mild, moderate, or severe)    - MILD (1-3): doesn't interfere with normal activities     - MODERATE (4-7): interferes with normal activities or awakens from sleep    - SEVERE (8-10): excruciating pain, unable to do any normal activities      4/10 7. CARDIAC RISK FACTORS: "Do you have any history of heart problems or risk factors for heart disease?" (e.g., prior heart attack, angina; high blood pressure, diabetes, being overweight, high cholesterol, smoking, or strong family history of heart disease)      8. PULMONARY RISK FACTORS: "Do you have any history of lung disease?"  (e.g., blood clots in lung,  asthma, emphysema, birth control pills)     asthma 9. CAUSE: "What do you think is causing the chest pain?"     "Pneumonia" 10. OTHER SYMPTOMS: "Do you have any other symptoms?" (e.g., dizziness, nausea, vomiting, sweating, fever, difficulty breathing, cough)   "Allergies started up again."  Protocols used: CHEST PAIN-A-AH

## 2017-12-10 NOTE — ED Provider Notes (Signed)
Patient placed in Quick Look pathway, seen and evaluated   Chief Complaint: flank pain right   HPI:   Denise [name] is a 72 y.o. female who presents to the ED with right flank pain. The pain started this morning. Patient took muscle relaxer with minimal relief. Patient c/o some shortness of breath. Patient reports her PCP was concerned due to patient recent travel and being on a plane for 10 hours day before yesterday and then for one hour yesterday. Increase shortness of breath with exertion. No hx of DVT. Took blood thinners for heart valve replacement but they were d/c a few months ago.   ROS: Resp: shortness of breath  Back: right flank pain  Physical Exam:  BP (!) 150/81 (BP Location: Left Arm)   Pulse 77   Temp 98.9 F (37.2 C) (Oral)   Resp 18   Ht 5\' 1"  (1.549 m)   Wt 106.6 kg   SpO2 97%   BMI 44.40 kg/m    Gen: No distress  Neuro: Awake and Alert  Skin: Warm and dry  Heart: regular rate and rhythm with murmur   Lungs: no wheezing, no rales     Initiation of care has begun. The patient has been counseled on the process, plan, and necessity for staying for the completion/evaluation, and the remainder of the medical screening examination    Ashley Murrain, NP 12/10/17 1546    Carmin Muskrat, MD 12/14/17 [phone]

## 2017-12-10 NOTE — ED Triage Notes (Signed)
Pt reports right flank/rib cage pain that started this morning. Pt denies any urinary sx, denies N/V. Denies any injuries. Pt reports taking a muscle relaxer with some relief. Pt reports long plane travel recently. Reports increase in pain with movement and inspiration. No hx of blood clots. Pt taken off Plavix in May for heart valve replacement, takes asa daily now.
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
## 2017-12-10 NOTE — Discharge Instructions (Signed)
See your Physician for recheck in 2-3 days  

## 2017-12-11 ENCOUNTER — Emergency Department (HOSPITAL_COMMUNITY)
Admission: EM | Admit: 2017-12-11 | Discharge: 2017-12-11 | Disposition: A | Discharge status: Home / Self Care | Payer: Medicare Other | Attending: Emergency Medicine | Admitting: Emergency Medicine

## 2017-12-11 DIAGNOSIS — R531 Weakness: Secondary | ICD-10-CM | POA: Diagnosis present

## 2017-12-11 DIAGNOSIS — Z79899 Other long term (current) drug therapy: Secondary | ICD-10-CM | POA: Diagnosis not present

## 2017-12-11 DIAGNOSIS — Z87891 Personal history of nicotine dependence: Secondary | ICD-10-CM | POA: Diagnosis not present

## 2017-12-11 DIAGNOSIS — Z7982 Long term (current) use of aspirin: Secondary | ICD-10-CM | POA: Insufficient documentation

## 2017-12-11 DIAGNOSIS — Z853 Personal history of malignant neoplasm of breast: Secondary | ICD-10-CM | POA: Diagnosis not present

## 2017-12-11 DIAGNOSIS — Z954 Presence of other heart-valve replacement: Secondary | ICD-10-CM | POA: Insufficient documentation

## 2017-12-11 DIAGNOSIS — R5383 Other fatigue: Secondary | ICD-10-CM | POA: Diagnosis not present

## 2017-12-11 DIAGNOSIS — R0789 Other chest pain: Secondary | ICD-10-CM | POA: Diagnosis not present

## 2017-12-11 DIAGNOSIS — I10 Essential (primary) hypertension: Secondary | ICD-10-CM | POA: Diagnosis not present

## 2017-12-11 DIAGNOSIS — J45909 Unspecified asthma, uncomplicated: Secondary | ICD-10-CM | POA: Diagnosis not present

## 2017-12-11 DIAGNOSIS — E119 Type 2 diabetes mellitus without complications: Secondary | ICD-10-CM | POA: Diagnosis not present

## 2017-12-11 LAB — BASIC METABOLIC PANEL
Anion gap: 10 (ref 5–15)
BUN: 13 mg/dL (ref 8–23)
CALCIUM: 9.7 mg/dL (ref 8.9–10.3)
CO2: 24 mmol/L (ref 22–32)
CREATININE: 1.1 mg/dL — AB (ref 0.44–1.00)
Chloride: 106 mmol/L (ref 98–111)
GFR calc Af Amer: 57 mL/min — ABNORMAL LOW (ref >60)
GFR, EST NON AFRICAN AMERICAN: 49 mL/min — AB (ref >60)
GLUCOSE: 134 mg/dL — AB (ref 70–99)
POTASSIUM: 3.6 mmol/L (ref 3.5–5.1)
SODIUM: 140 mmol/L (ref 135–145)

## 2017-12-11 LAB — URINALYSIS, ROUTINE W REFLEX MICROSCOPIC
Bilirubin Urine: NEGATIVE
GLUCOSE, UA: NEGATIVE mg/dL
HGB URINE DIPSTICK: NEGATIVE
Ketones, ur: NEGATIVE mg/dL
LEUKOCYTES UA: NEGATIVE
NITRITE: NEGATIVE
PROTEIN: 30 mg/dL — AB
Specific Gravity, Urine: 1.029 (ref 1.005–1.030)
pH: 5 (ref 5.0–8.0)

## 2017-12-11 LAB — CBC
HEMATOCRIT: 39 % (ref 36.0–46.0)
Hemoglobin: 12.2 g/dL (ref 12.0–15.0)
MCH: 26.2 pg (ref 26.0–34.0)
MCHC: 31.3 g/dL (ref 30.0–36.0)
MCV: 83.9 fL (ref 78.0–100.0)
PLATELETS: 195 10*3/uL (ref 150–400)
RBC: 4.65 MIL/uL (ref 3.87–5.11)
RDW: 15.4 % (ref 11.5–15.5)
WBC: 9.1 10*3/uL (ref 4.0–10.5)

## 2017-12-11 NOTE — Discharge Instructions (Addendum)
Follow-up with your primary care doctor in 2 or 3 days for reevaluation and repeat his kidney function.  Continue home medications as previously prescribed.  Recommend increasing your fluid intake for the next couple of days drinking at least 8 or 12 glasses of water daily.  Return to the emergency department if symptoms worsen.

## 2017-12-11 NOTE — ED Provider Notes (Addendum)
I saw and evaluated the patient, reviewed the resident's note and I agree with the findings and plan.  EKG: None   ED ECG REPORT   Date: 12/25/2017  Rate: 90  Rhythm: nl  QRS Axis: nl  Intervals: nl  ST/T Wave abnormalities: nl  Conduction Disutrbances:nl  Narrative Interpretation:   Old EKG Reviewed: nl  I have personally reviewed the EKG tracing and agree with the computerized printout as noted.  72 year old female here with weakness that began after she took a muscle relaxant.  Seen her yesterday for chest pain and had extensive work-up which did not show any acute findings.  Feels better at this time.  Stable for discharge   Lacretia Leigh, MD 12/11/17 2037    Lacretia Leigh, MD 12/25/17 [phone]
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
## 2017-12-11 NOTE — ED Notes (Signed)
ED Provider at bedside. 

## 2017-12-11 NOTE — ED Triage Notes (Signed)
Patient here for evaluation of weakness. States she was seen yesterday for chest pain, diagnosed with chest wall pain and sent home with medication. Patient states last night she was laying down and felt like she was falling while laying down. Patient alert, oriented, and in no apparent distress at this time.

## 2017-12-12 DIAGNOSIS — C50919 Malignant neoplasm of unspecified site of unspecified female breast: Secondary | ICD-10-CM | POA: Diagnosis not present

## 2017-12-12 DIAGNOSIS — C50911 Malignant neoplasm of unspecified site of right female breast: Secondary | ICD-10-CM | POA: Diagnosis not present

## 2017-12-15 ENCOUNTER — Encounter: Payer: Self-pay | Admitting: Internal Medicine

## 2017-12-16 ENCOUNTER — Other Ambulatory Visit: Payer: Self-pay | Admitting: Hematology and Oncology

## 2017-12-16 ENCOUNTER — Encounter: Payer: Self-pay | Admitting: Gastroenterology

## 2017-12-16 DIAGNOSIS — Z9889 Other specified postprocedural states: Secondary | ICD-10-CM

## 2017-12-16 DIAGNOSIS — Z1231 Encounter for screening mammogram for malignant neoplasm of breast: Secondary | ICD-10-CM

## 2017-12-16 DIAGNOSIS — Z853 Personal history of malignant neoplasm of breast: Secondary | ICD-10-CM

## 2018-01-01 ENCOUNTER — Other Ambulatory Visit: Payer: Self-pay | Admitting: Internal Medicine

## 2018-01-02 ENCOUNTER — Ambulatory Visit
Admission: RE | Admit: 2018-01-02 | Discharge: 2018-01-02 | Disposition: A | Discharge status: Home / Self Care | Payer: Medicare Other | Source: Ambulatory Visit | Attending: Hematology and Oncology | Admitting: Hematology and Oncology

## 2018-01-02 DIAGNOSIS — R928 Other abnormal and inconclusive findings on diagnostic imaging of breast: Secondary | ICD-10-CM | POA: Diagnosis not present

## 2018-01-02 DIAGNOSIS — Z853 Personal history of malignant neoplasm of breast: Secondary | ICD-10-CM

## 2018-01-18 ENCOUNTER — Other Ambulatory Visit: Payer: Self-pay | Admitting: Hematology and Oncology

## 2018-01-18 ENCOUNTER — Other Ambulatory Visit: Payer: Self-pay | Admitting: Internal Medicine

## 2018-01-19 NOTE — Telephone Encounter (Signed)
Routing to dr Jenny Reichmann, labs from June 2019 look ok, but patient has not had wellness visit with you in the last 2 years, please advise on rx refill request, thanks

## 2018-02-03 ENCOUNTER — Ambulatory Visit (AMBULATORY_SURGERY_CENTER): Payer: Self-pay | Admitting: *Deleted

## 2018-02-03 ENCOUNTER — Telehealth: Payer: Self-pay | Admitting: *Deleted

## 2018-02-03 VITALS — Ht 61.0 in | Wt 235.0 lb

## 2018-02-03 DIAGNOSIS — Z1211 Encounter for screening for malignant neoplasm of colon: Secondary | ICD-10-CM

## 2018-02-03 MED ORDER — NA SULFATE-K SULFATE-MG SULF 17.5-3.13-1.6 GM/177ML PO SOLN: 1.0000 | Freq: Once | ORAL | 0 refills | Status: AC

## 2018-02-03 NOTE — Progress Notes (Signed)
No egg or soy allergy known to patient  No issues with past sedation with any surgeries  or procedures, no intubation problems  No diet pills per patient No home 02 use per patient  No blood thinners per patient  Pt denies issues with constipation  No A fib or A flutter  EMMI video sent to pt's e mail - pt declined  TE to Dr Fuller Plan- ? Need for Amoxil prior to colon due to AVR 2018

## 2018-02-03 NOTE — Telephone Encounter (Signed)
Pt made aware- Lelan Pons

## 2018-02-03 NOTE — Telephone Encounter (Signed)
AHA and ASGE do not recommend antibiotic prophylaxis for routine colonoscopy, upper endoscopy even with an AVR. Dental procedures have a higher risk of bacteremia so would continue with antibiotics before dental work as recommneded.

## 2018-02-03 NOTE — Telephone Encounter (Signed)
Dr Fuller Plan,  I saw Mrs Denise [name] in [facility] today- she has had a AV replacement  2018 may-  She has to have antibiotics prior to Dental procedures- She asked about Amoxil prior to her colon- she has not been told she needs this for a colon but wanted to be sure.  Please advise, Thanks... Lelan Pons

## 2018-02-06 ENCOUNTER — Encounter: Payer: Self-pay | Admitting: Internal Medicine

## 2018-02-06 MED ORDER — ALBUTEROL SULFATE HFA 108 (90 BASE) MCG/ACT IN AERS: 2.0000 | INHALATION_SPRAY | Freq: Four times a day (QID) | RESPIRATORY_TRACT | 2 refills | Status: DC | PRN

## 2018-02-17 ENCOUNTER — Encounter: Payer: Self-pay | Admitting: Gastroenterology

## 2018-02-17 ENCOUNTER — Ambulatory Visit (AMBULATORY_SURGERY_CENTER): Payer: Medicare Other | Admitting: Gastroenterology

## 2018-02-17 VITALS — BP 186/83 | HR 59 | Temp 97.8°F | Resp 13 | Ht 61.0 in | Wt 235.0 lb

## 2018-02-17 DIAGNOSIS — K621 Rectal polyp: Secondary | ICD-10-CM

## 2018-02-17 DIAGNOSIS — Z1211 Encounter for screening for malignant neoplasm of colon: Secondary | ICD-10-CM | POA: Diagnosis not present

## 2018-02-17 DIAGNOSIS — K5140 Inflammatory polyps of colon without complications: Secondary | ICD-10-CM | POA: Diagnosis not present

## 2018-02-17 DIAGNOSIS — D127 Benign neoplasm of rectosigmoid junction: Secondary | ICD-10-CM | POA: Diagnosis not present

## 2018-02-17 DIAGNOSIS — D128 Benign neoplasm of rectum: Secondary | ICD-10-CM

## 2018-02-17 DIAGNOSIS — K635 Polyp of colon: Secondary | ICD-10-CM

## 2018-02-17 DIAGNOSIS — D125 Benign neoplasm of sigmoid colon: Secondary | ICD-10-CM

## 2018-02-17 MED ORDER — SODIUM CHLORIDE 0.9 % IV SOLN: 500.0000 mL | Freq: Once | INTRAVENOUS | Status: DC

## 2018-02-17 NOTE — Progress Notes (Signed)
To PACU,VSs. Report to [email or web address] 

## 2018-02-17 NOTE — Patient Instructions (Signed)
HANDOUTS GIVEN FOR POLYPS, DIVERTICULOSIS, HEMORRHOIDS, AND HIGH FIBER DIET  YOU HAD AN ENDOSCOPIC PROCEDURE TODAY AT [facility]:   Refer to the procedure report that was given to you for any specific questions about what was found during the examination.  If the procedure report does not answer your questions, please call your gastroenterologist to clarify.  If you requested that your care partner not be given the details of your procedure findings, then the procedure report has been included in a sealed envelope for you to review at your convenience later.  YOU SHOULD EXPECT: Some feelings of bloating in the abdomen. Passage of more gas than usual.  Walking can help get rid of the air that was put into your GI tract during the procedure and reduce the bloating. If you had a lower endoscopy (such as a colonoscopy or flexible sigmoidoscopy) you may notice spotting of blood in your stool or on the toilet paper. If you underwent a bowel prep for your procedure, you may not have a normal bowel movement for a few days.  Please Note:  You might notice some irritation and congestion in your nose or some drainage.  This is from the oxygen used during your procedure.  There is no need for concern and it should clear up in a day or so.  SYMPTOMS TO REPORT IMMEDIATELY:   Following lower endoscopy (colonoscopy or flexible sigmoidoscopy):  Excessive amounts of blood in the stool  Significant tenderness or worsening of abdominal pains  Swelling of the abdomen that is new, acute  Fever of 100F or higher   For urgent or emergent issues, a gastroenterologist can be reached at any hour by calling [phone].   DIET:  We do recommend a small meal at first, but then you may proceed to your regular diet.  Drink plenty of fluids but you should avoid alcoholic beverages for 24 hours.  ACTIVITY:  You should plan to take it easy for the rest of today and you should NOT DRIVE or use heavy  machinery until tomorrow (because of the sedation medicines used during the test).    FOLLOW UP: Our staff will call the number listed on your records the next business day following your procedure to check on you and address any questions or concerns that you may have regarding the information given to you following your procedure. If we do not reach you, we will leave a message.  However, if you are feeling well and you are not experiencing any problems, there is no need to return our call.  We will assume that you have returned to your regular daily activities without incident.  If any biopsies were taken you will be contacted by phone or by letter within the next 1-3 weeks.  Please call us at [phone] if you have not heard about the biopsies in 3 weeks.    SIGNATURES/CONFIDENTIALITY: You and/or your care partner have signed paperwork which will be entered into your electronic medical record.  These signatures attest to the fact that that the information above on your After Visit Summary has been reviewed and is understood.  Full responsibility of the confidentiality of this discharge information lies with you and/or your care-partner.

## 2018-02-17 NOTE — Progress Notes (Signed)
Pt's states no medical or surgical changes since previsit or office visit. 

## 2018-02-17 NOTE — Op Note (Signed)
[facility] Patient Name: Denise [name] Procedure Date: 02/17/2018 8:43 AM MRN: [id number] Endoscopist: Ladene Artist , MD Age: 72 Referring MD:  Date of Birth: 09/03/1945 Gender: Female Account #: [id number] Procedure:                Colonoscopy Indications:              Screening for colorectal malignant neoplasm Medicines:                Monitored Anesthesia Care Procedure:                Pre-Anesthesia Assessment:                           - Prior to the procedure, a History and Physical                            was performed, and patient medications and                            allergies were reviewed. The patient's tolerance of                            previous anesthesia was also reviewed. The risks                            and benefits of the procedure and the sedation                            options and risks were discussed with the patient.                            All questions were answered, and informed consent                            was obtained. Prior Anticoagulants: The patient has                            taken no previous anticoagulant or antiplatelet                            agents. ASA Grade Assessment: II - A patient with                            mild systemic disease. After reviewing the risks                            and benefits, the patient was deemed in                            satisfactory condition to undergo the procedure.                           After obtaining informed consent, the colonoscope  was passed under direct vision. Throughout the                            procedure, the patient's blood pressure, pulse, and                            oxygen saturations were monitored continuously. The                            Model PCF-[id number]) scope was introduced                            through the anus and advanced to the the cecum,   identified by appendiceal orifice and ileocecal                            valve. The ileocecal valve, appendiceal orifice,                            and rectum were photographed. The quality of the                            bowel preparation was good. The colonoscopy was                            performed without difficulty. The patient tolerated                            the procedure well. Scope In: 8:48:22 AM Scope Out: 9:03:19 AM Scope Withdrawal Time: 0 hours 11 minutes 35 seconds  Total Procedure Duration: 0 hours 14 minutes 57 seconds  Findings:                 The perianal and digital rectal examinations were                            normal.                           Three sessile polyps were found in the rectum (1)                            and sigmoid colon (2). The polyps were 5 to 7 mm in                            size. These polyps were removed with a cold snare.                            Resection and retrieval were complete.                           Multiple small-mouthed diverticula were found in                            the left colon.  Internal hemorrhoids were found during                            retroflexion. The hemorrhoids were small and Grade                            I (internal hemorrhoids that do not prolapse).                           The exam was otherwise without abnormality on                            direct and retroflexion views. Complications:            No immediate complications. Estimated blood loss:                            None. Estimated Blood Loss:     Estimated blood loss: none. Impression:               - Three 5 to 7 mm polyps in the rectum and in the                            sigmoid colon, removed with a cold snare. Resected                            and retrieved.                           - Diverticulosis in the left colon.                           - Internal hemorrhoids.                            - The examination was otherwise normal on direct                            and retroflexion views. Recommendation:           - Repeat colonoscopy in 3 - 5 years for                            surveillance if polyp(s) are precancerous,                            otherwise 10 years.                           - Patient has a contact number available for                            emergencies. The signs and symptoms of potential                            delayed complications were discussed with the  patient. Return to normal activities tomorrow.                            Written discharge instructions were provided to the                            patient.                           - High fiber diet.                           - Continue present medications.                           - Await pathology results. Ladene Artist, MD 02/17/2018 9:07:07 AM This report has been signed electronically.

## 2018-02-17 NOTE — Progress Notes (Signed)
Called to room to assist during endoscopic procedure.  Patient ID and intended procedure confirmed with present staff. Received instructions for my participation in the procedure from the performing physician.  

## 2018-02-18 ENCOUNTER — Telehealth: Payer: Self-pay

## 2018-02-18 ENCOUNTER — Telehealth: Payer: Self-pay | Admitting: *Deleted

## 2018-02-18 NOTE — Telephone Encounter (Signed)
  Follow up Call-  Call back number 02/17/2018  Post procedure Call Back phone  # [phone]  Permission to leave phone message Yes  Some recent data might be hidden     Patient questions:  Do you have a fever, pain , or abdominal swelling? No. Pain Score  0 *  Have you tolerated food without any problems? Yes.    Have you been able to return to your normal activities? Yes.    Do you have any questions about your discharge instructions: Diet   No. Medications  No. Follow up visit  No.  Do you have questions or concerns about your Care? No.  Actions: * If pain score is 4 or above: No action needed, pain <4.

## 2018-02-18 NOTE — Telephone Encounter (Signed)
Left message

## 2018-02-23 DIAGNOSIS — Z961 Presence of intraocular lens: Secondary | ICD-10-CM | POA: Diagnosis not present

## 2018-02-23 DIAGNOSIS — E119 Type 2 diabetes mellitus without complications: Secondary | ICD-10-CM | POA: Diagnosis not present

## 2018-02-23 DIAGNOSIS — H52203 Unspecified astigmatism, bilateral: Secondary | ICD-10-CM | POA: Diagnosis not present

## 2018-02-23 LAB — HM DIABETES EYE EXAM

## 2018-03-06 ENCOUNTER — Encounter: Payer: Self-pay | Admitting: Gastroenterology

## 2018-03-10 ENCOUNTER — Encounter: Payer: Self-pay | Admitting: Internal Medicine

## 2018-03-10 ENCOUNTER — Ambulatory Visit: Payer: Medicare Other | Admitting: Internal Medicine

## 2018-03-10 ENCOUNTER — Ambulatory Visit (INDEPENDENT_AMBULATORY_CARE_PROVIDER_SITE_OTHER): Payer: Medicare Other | Admitting: Internal Medicine

## 2018-03-10 VITALS — BP 146/84 | HR 76 | Temp 98.6°F | Resp 18 | Ht 61.0 in | Wt 236.0 lb

## 2018-03-10 DIAGNOSIS — I10 Essential (primary) hypertension: Secondary | ICD-10-CM | POA: Diagnosis not present

## 2018-03-10 DIAGNOSIS — J4541 Moderate persistent asthma with (acute) exacerbation: Secondary | ICD-10-CM | POA: Diagnosis not present

## 2018-03-10 DIAGNOSIS — I6529 Occlusion and stenosis of unspecified carotid artery: Secondary | ICD-10-CM | POA: Diagnosis not present

## 2018-03-10 DIAGNOSIS — J45909 Unspecified asthma, uncomplicated: Secondary | ICD-10-CM | POA: Insufficient documentation

## 2018-03-10 DIAGNOSIS — M5440 Lumbago with sciatica, unspecified side: Secondary | ICD-10-CM | POA: Diagnosis not present

## 2018-03-10 MED ORDER — METHYLPREDNISOLONE ACETATE 80 MG/ML IJ SUSP
80.0000 mg | Freq: Once | INTRAMUSCULAR | Status: AC
  Administered 2018-03-10: 80 mg via INTRAMUSCULAR

## 2018-03-10 MED ORDER — PREDNISONE 10 MG PO TABS: ORAL_TABLET | ORAL | 0 refills | Status: DC

## 2018-03-10 MED ORDER — DOXYCYCLINE HYCLATE 100 MG PO TABS: 100.0000 mg | ORAL_TABLET | Freq: Two times a day (BID) | ORAL | 0 refills | Status: DC

## 2018-03-10 NOTE — Assessment & Plan Note (Signed)
Likely bacterial bronchitis causing asthma exacerbation Depo-Medrol 80 mg IM x1 Prednisone taper starting tomorrow Doxycycline twice daily x10 days Continue home inhalers and nebulizer treatments Over-the-counter cold medications as needed Increase rest and fluids Call if no improvement

## 2018-03-10 NOTE — Patient Instructions (Signed)
You received a steroid injection today.   Take the antibiotic as prescribed - complete the entire course.  Start the prednisone taper tomorrow.  Continue your inhalers.    Continue over the counter cold medication, advil and tylenol.  Increase your fluids and rest.    Call if no improvement
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
## 2018-03-16 ENCOUNTER — Ambulatory Visit: Payer: Medicare Other | Admitting: Podiatry

## 2018-03-24 DIAGNOSIS — M79604 Pain in right leg: Secondary | ICD-10-CM | POA: Diagnosis not present

## 2018-03-24 DIAGNOSIS — M545 Low back pain: Secondary | ICD-10-CM | POA: Diagnosis not present

## 2018-03-24 DIAGNOSIS — R262 Difficulty in walking, not elsewhere classified: Secondary | ICD-10-CM | POA: Diagnosis not present

## 2018-03-24 DIAGNOSIS — M6281 Muscle weakness (generalized): Secondary | ICD-10-CM | POA: Diagnosis not present

## 2018-03-25 ENCOUNTER — Encounter: Payer: Self-pay | Admitting: Podiatry

## 2018-03-25 ENCOUNTER — Ambulatory Visit (INDEPENDENT_AMBULATORY_CARE_PROVIDER_SITE_OTHER): Payer: Medicare Other | Admitting: Podiatry

## 2018-03-25 DIAGNOSIS — L84 Corns and callosities: Secondary | ICD-10-CM

## 2018-03-25 DIAGNOSIS — E119 Type 2 diabetes mellitus without complications: Secondary | ICD-10-CM | POA: Diagnosis not present

## 2018-03-25 DIAGNOSIS — Z9229 Personal history of other drug therapy: Secondary | ICD-10-CM

## 2018-03-25 DIAGNOSIS — Z7901 Long term (current) use of anticoagulants: Secondary | ICD-10-CM

## 2018-03-25 NOTE — Patient Instructions (Signed)
Corns and Calluses Corns are small areas of thickened skin that occur on the top, sides, or tip of a toe. They contain a cone-shaped core with a point that can press on a nerve below. This causes pain. Calluses are areas of thickened skin that can occur anywhere on the body including hands, fingers, palms, soles of the feet, and heels.Calluses are usually larger than corns. What are the causes? Corns and calluses are caused by rubbing (friction) or pressure, such as from shoes that are too tight or do not fit properly. What increases the risk? Corns are more likely to develop in people who have toe deformities, such as hammer toes. Since calluses can occur with friction to any area of the skin, calluses are more likely to develop in people who:  Work with their hands.  Wear shoes that fit poorly, shoes that are too tight, or shoes that are high-heeled.  Have toes deformities.  What are the signs or symptoms? Symptoms of a corn or callus include:  A hard growth on the skin.  Pain or tenderness under the skin.  Redness and swelling.  Increased discomfort while wearing tight-fitting shoes.  How is this diagnosed? Corns and calluses may be diagnosed with a medical history and physical exam. How is this treated? Corns and calluses may be treated with:  Removing the cause of the friction or pressure. This may include: ? Changing your shoes. ? Wearing shoe inserts (orthotics) or other protective layers in your shoes, such as a corn pad. ? Wearing gloves.  Medicines to help soften skin in the hardened, thickened areas.  Reducing the size of the corn or callus by removing the dead layers of skin.  Antibiotic medicines to treat infection.  Surgery, if a toe deformity is the cause.  Follow these instructions at home:  Take medicines only as directed by your health care provider.  If you were prescribed an antibiotic, finish all of it even if you start to feel better.  Wear  shoes that fit well. Avoid wearing high-heeled shoes and shoes that are too tight or too loose.  Wear any padding, protective layers, gloves, or orthotics as directed by your health care provider.  Soak your hands or feet and then use a file or pumice stone to soften your corn or callus. Do this as directed by your health care provider.  Check your corn or callus every day for signs of infection. Watch for: ? Redness, swelling, or pain. ? Fluid, blood, or pus. Contact a health care provider if:  Your symptoms do not improve with treatment.  You have increased redness, swelling, or pain at the site of your corn or callus.  You have fluid, blood, or pus coming from your corn or callus.  You have new symptoms. This information is not intended to replace advice given to you by your health care provider. Make sure you discuss any questions you have with your health care provider. Document Released: 01/20/2004 Document Revised: 11/03/2015 Document Reviewed: 04/11/2014 Elsevier Interactive Patient Education  2018 [employer]  Diabetes and Foot Care Diabetes may cause you to have problems because of poor blood supply (circulation) to your feet and legs. This may cause the skin on your feet to become thinner, break easier, and heal more slowly. Your skin may become dry, and the skin may peel and crack. You may also have nerve damage in your legs and feet causing decreased feeling in them. You may not notice minor injuries to your   feet that could lead to infections or more serious problems. Taking care of your feet is one of the most important things you can do for yourself. Follow these instructions at home:  Wear shoes at all times, even in the house. Do not go barefoot. Bare feet are easily injured.  Check your feet daily for blisters, cuts, and redness. If you cannot see the bottom of your feet, use a mirror or ask someone for help.  Wash your feet with warm water (do not use hot water)  and mild soap. Then pat your feet and the areas between your toes until they are completely dry. Do not soak your feet as this can dry your skin.  Apply a moisturizing lotion or petroleum jelly (that does not contain alcohol and is unscented) to the skin on your feet and to dry, brittle toenails. Do not apply lotion between your toes.  Trim your toenails straight across. Do not dig under them or around the cuticle. File the edges of your nails with an emery board or nail file.  Do not cut corns or calluses or try to remove them with medicine.  Wear clean socks or stockings every day. Make sure they are not too tight. Do not wear knee-high stockings since they may decrease blood flow to your legs.  Wear shoes that fit properly and have enough cushioning. To break in new shoes, wear them for just a few hours a day. This prevents you from injuring your feet. Always look in your shoes before you put them on to be sure there are no objects inside.  Do not cross your legs. This may decrease the blood flow to your feet.  If you find a minor scrape, cut, or break in the skin on your feet, keep it and the skin around it clean and dry. These areas may be cleansed with mild soap and water. Do not cleanse the area with peroxide, alcohol, or iodine.  When you remove an adhesive bandage, be sure not to damage the skin around it.  If you have a wound, look at it several times a day to make sure it is healing.  Do not use heating pads or hot water bottles. They may burn your skin. If you have lost feeling in your feet or legs, you may not know it is happening until it is too late.  Make sure your health care provider performs a complete foot exam at least annually or more often if you have foot problems. Report any cuts, sores, or bruises to your health care provider immediately. Contact a health care provider if:  You have an injury that is not healing.  You have cuts or breaks in the skin.  You have  an ingrown nail.  You notice redness on your legs or feet.  You feel burning or tingling in your legs or feet.  You have pain or cramps in your legs and feet.  Your legs or feet are numb.  Your feet always feel cold. Get help right away if:  There is increasing redness, swelling, or pain in or around a wound.  There is a red line that goes up your leg.  Pus is coming from a wound.  You develop a fever or as directed by your health care provider.  You notice a bad smell coming from an ulcer or wound. This information is not intended to replace advice given to you by your health care provider. Make sure you discuss any questions   you have with your health care provider. Document Released: 04/12/2000 Document Revised: 09/21/2015 Document Reviewed: 09/22/2012 Elsevier Interactive Patient Education  2017 [employer] Inc.  

## 2018-03-25 NOTE — Progress Notes (Signed)
Subjective: Denise [name] is a 72 y.o. y.o. female who presents today for annual diabetic foot examination.  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  She does have hammertoe deformity with dorsal corn formation of bilateral fifth digits.  She does get pedicures periodically.  She relates that they do not use sharp instrumentation on her feet.  She does relate numbness in her feet but states this is from her back surgery and not diabetes.  She continues to take Plavix daily.  She denies any foot ulceration, claudication or amputation since her last visit.  She is a former smoker.  Objective: Vascular Examination: Capillary refill time immediate x10 digits Dorsalis pedis pulses 2/4 bilaterally Posterior tibial pulses 1/4 bilaterally Digital hair present x10 digits  Skin temperature gradient within normal limits bilaterally  Dermatological Examination: Skin with normal turgor, texture and tone b/l  Toenails 1-5 b/l are of adequate length and well-maintained bilaterally.  There are no signs of erythema edema or drainage or nail borders.  No signs of infection.    Hyperkeratosis noted dorsal PIPJ bilateral fifth digits.  There is no erythema, no edema no drainage noted.   Musculoskeletal: Muscle strength 5/5 to all LE muscle groups Hammertoe deformity fifth digits bilaterally Hallux abductovalgus with bunion deformity bilaterally  Neurological: Sensation diminished with 10 gram monofilament. Vibratory sensation diminished  Assessment: 1. Corn bilateral fifth digits 2. Hammertoe deformity bilateral fifth digits 3. NIDDM 4.  Patient on long-term anticoagulant . Plan: 1. Continue diabetic foot care principles.  Literature dispensed on today 2. We discussed pedicures in detail.  She still would like to get pedicures and she states she monitors her feet daily.  At this point she feels like she does not need to come every 3 months but would like to be seen  every 6 months.  All of this with the understanding should she encounter any foot problems in the interim she may come in immediately. 3. Corns pared bilateral fifth digits without incident 4. Patient to continue soft, supportive shoe gear 5. Patient to report any pedal injuries to medical professional  6. Follow up 3 months.  7. Patient/POA to call should there be a concern in the interim.

## 2018-03-31 DIAGNOSIS — M79604 Pain in right leg: Secondary | ICD-10-CM | POA: Diagnosis not present

## 2018-03-31 DIAGNOSIS — R262 Difficulty in walking, not elsewhere classified: Secondary | ICD-10-CM | POA: Diagnosis not present

## 2018-03-31 DIAGNOSIS — M6281 Muscle weakness (generalized): Secondary | ICD-10-CM | POA: Diagnosis not present

## 2018-03-31 DIAGNOSIS — M545 Low back pain: Secondary | ICD-10-CM | POA: Diagnosis not present

## 2018-04-02 ENCOUNTER — Other Ambulatory Visit: Payer: Self-pay | Admitting: Internal Medicine

## 2018-04-02 DIAGNOSIS — R262 Difficulty in walking, not elsewhere classified: Secondary | ICD-10-CM | POA: Diagnosis not present

## 2018-04-02 DIAGNOSIS — M6281 Muscle weakness (generalized): Secondary | ICD-10-CM | POA: Diagnosis not present

## 2018-04-02 DIAGNOSIS — M545 Low back pain: Secondary | ICD-10-CM | POA: Diagnosis not present

## 2018-04-02 DIAGNOSIS — M79604 Pain in right leg: Secondary | ICD-10-CM | POA: Diagnosis not present

## 2018-04-06 DIAGNOSIS — M545 Low back pain: Secondary | ICD-10-CM | POA: Diagnosis not present

## 2018-04-06 DIAGNOSIS — M79604 Pain in right leg: Secondary | ICD-10-CM | POA: Diagnosis not present

## 2018-04-06 DIAGNOSIS — R262 Difficulty in walking, not elsewhere classified: Secondary | ICD-10-CM | POA: Diagnosis not present

## 2018-04-06 DIAGNOSIS — M6281 Muscle weakness (generalized): Secondary | ICD-10-CM | POA: Diagnosis not present

## 2018-04-08 DIAGNOSIS — M6281 Muscle weakness (generalized): Secondary | ICD-10-CM | POA: Diagnosis not present

## 2018-04-08 DIAGNOSIS — M545 Low back pain: Secondary | ICD-10-CM | POA: Diagnosis not present

## 2018-04-08 DIAGNOSIS — M79604 Pain in right leg: Secondary | ICD-10-CM | POA: Diagnosis not present

## 2018-04-08 DIAGNOSIS — R262 Difficulty in walking, not elsewhere classified: Secondary | ICD-10-CM | POA: Diagnosis not present

## 2018-04-13 DIAGNOSIS — R262 Difficulty in walking, not elsewhere classified: Secondary | ICD-10-CM | POA: Diagnosis not present

## 2018-04-13 DIAGNOSIS — M79604 Pain in right leg: Secondary | ICD-10-CM | POA: Diagnosis not present

## 2018-04-13 DIAGNOSIS — M6281 Muscle weakness (generalized): Secondary | ICD-10-CM | POA: Diagnosis not present

## 2018-04-13 DIAGNOSIS — M545 Low back pain: Secondary | ICD-10-CM | POA: Diagnosis not present

## 2018-04-15 ENCOUNTER — Ambulatory Visit (INDEPENDENT_AMBULATORY_CARE_PROVIDER_SITE_OTHER): Payer: Medicare Other | Admitting: Internal Medicine

## 2018-04-15 ENCOUNTER — Other Ambulatory Visit: Payer: Self-pay | Admitting: Internal Medicine

## 2018-04-15 ENCOUNTER — Encounter: Payer: Self-pay | Admitting: Internal Medicine

## 2018-04-15 ENCOUNTER — Other Ambulatory Visit (INDEPENDENT_AMBULATORY_CARE_PROVIDER_SITE_OTHER): Payer: Medicare Other

## 2018-04-15 VITALS — BP 124/82 | HR 84 | Temp 98.3°F | Ht 61.0 in | Wt 239.0 lb

## 2018-04-15 DIAGNOSIS — I10 Essential (primary) hypertension: Secondary | ICD-10-CM | POA: Diagnosis not present

## 2018-04-15 DIAGNOSIS — I6529 Occlusion and stenosis of unspecified carotid artery: Secondary | ICD-10-CM

## 2018-04-15 DIAGNOSIS — M545 Low back pain: Secondary | ICD-10-CM | POA: Diagnosis not present

## 2018-04-15 DIAGNOSIS — R262 Difficulty in walking, not elsewhere classified: Secondary | ICD-10-CM | POA: Diagnosis not present

## 2018-04-15 DIAGNOSIS — E7800 Pure hypercholesterolemia, unspecified: Secondary | ICD-10-CM

## 2018-04-15 DIAGNOSIS — M79604 Pain in right leg: Secondary | ICD-10-CM | POA: Diagnosis not present

## 2018-04-15 DIAGNOSIS — E119 Type 2 diabetes mellitus without complications: Secondary | ICD-10-CM | POA: Diagnosis not present

## 2018-04-15 DIAGNOSIS — M6281 Muscle weakness (generalized): Secondary | ICD-10-CM | POA: Diagnosis not present

## 2018-04-15 LAB — CBC WITH DIFFERENTIAL/PLATELET
Basophils Absolute: 0.1 10*3/uL (ref 0.0–0.1)
Basophils Relative: 0.9 % (ref 0.0–3.0)
Eosinophils Absolute: 0.2 10*3/uL (ref 0.0–0.7)
Eosinophils Relative: 2.5 % (ref 0.0–5.0)
HCT: 40.4 % (ref 36.0–46.0)
Hemoglobin: 13.5 g/dL (ref 12.0–15.0)
Lymphocytes Relative: 28.2 % (ref 12.0–46.0)
Lymphs Abs: 2.5 10*3/uL (ref 0.7–4.0)
MCHC: 33.3 g/dL (ref 30.0–36.0)
MCV: 81.5 fl (ref 78.0–100.0)
Monocytes Absolute: 0.7 10*3/uL (ref 0.1–1.0)
Monocytes Relative: 7.6 % (ref 3.0–12.0)
Neutro Abs: 5.3 10*3/uL (ref 1.4–7.7)
Neutrophils Relative %: 60.8 % (ref 43.0–77.0)
Platelets: 217 10*3/uL (ref 150.0–400.0)
RBC: 4.96 Mil/uL (ref 3.87–5.11)
RDW: 15.4 % (ref 11.5–15.5)
WBC: 8.8 10*3/uL (ref 4.0–10.5)

## 2018-04-15 LAB — BASIC METABOLIC PANEL
BUN: 13 mg/dL (ref 6–23)
CO2: 30 meq/L (ref 19–32)
Calcium: 10 mg/dL (ref 8.4–10.5)
Chloride: 107 mEq/L (ref 96–112)
Creatinine, Ser: 0.78 mg/dL (ref 0.40–1.20)
GFR: 93.17 mL/min (ref >60.00)
GLUCOSE: 141 mg/dL — AB (ref 70–99)
Potassium: 3.9 mEq/L (ref 3.5–5.1)
Sodium: 143 mEq/L (ref 135–145)

## 2018-04-15 LAB — URINALYSIS, ROUTINE W REFLEX MICROSCOPIC
Bilirubin Urine: NEGATIVE
HGB URINE DIPSTICK: NEGATIVE
Leukocytes, UA: NEGATIVE
Nitrite: NEGATIVE
RBC / HPF: NONE SEEN (ref 0–?)
Specific Gravity, Urine: 1.015 (ref 1.000–1.030)
Urine Glucose: NEGATIVE
Urobilinogen, UA: 0.2 (ref 0.0–1.0)
pH: 6 (ref 5.0–8.0)

## 2018-04-15 LAB — LIPID PANEL
Cholesterol: 160 mg/dL (ref 0–200)
HDL: 74.9 mg/dL (ref >39.00)
LDL Cholesterol: 70 mg/dL (ref 0–99)
NONHDL: 85.42
Total CHOL/HDL Ratio: 2
Triglycerides: 75 mg/dL (ref 0.0–149.0)
VLDL: 15 mg/dL (ref 0.0–40.0)

## 2018-04-15 LAB — MICROALBUMIN / CREATININE URINE RATIO
Creatinine,U: 187.2 mg/dL
Microalb Creat Ratio: 0.8 mg/g (ref 0.0–30.0)
Microalb, Ur: 1.5 mg/dL (ref 0.0–1.9)

## 2018-04-15 LAB — HEMOGLOBIN A1C: HEMOGLOBIN A1C: 7.2 % — AB (ref 4.6–6.5)

## 2018-04-15 LAB — HEPATIC FUNCTION PANEL
ALT: 21 U/L (ref 0–35)
AST: 18 U/L (ref 0–37)
Albumin: 4.5 g/dL (ref 3.5–5.2)
Alkaline Phosphatase: 59 U/L (ref 39–117)
Bilirubin, Direct: 0.1 mg/dL (ref 0.0–0.3)
Total Bilirubin: 0.6 mg/dL (ref 0.2–1.2)
Total Protein: 7.6 g/dL (ref 6.0–8.3)

## 2018-04-15 LAB — TSH: TSH: 0.71 u[IU]/mL (ref 0.35–4.50)

## 2018-04-15 NOTE — Assessment & Plan Note (Signed)
stable overall by history and exam, recent data reviewed with pt, and pt to continue medical treatment as before,  to f/u any worsening symptoms or concerns, for f/u lab 

## 2018-04-15 NOTE — Assessment & Plan Note (Signed)
stable overall by history and exam, recent data reviewed with pt, and pt to continue medical treatment as before,  to f/u any worsening symptoms or concerns  
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
## 2018-04-15 NOTE — Patient Instructions (Signed)

## 2018-04-23 DIAGNOSIS — M6281 Muscle weakness (generalized): Secondary | ICD-10-CM | POA: Diagnosis not present

## 2018-04-23 DIAGNOSIS — R262 Difficulty in walking, not elsewhere classified: Secondary | ICD-10-CM | POA: Diagnosis not present

## 2018-04-23 DIAGNOSIS — M79604 Pain in right leg: Secondary | ICD-10-CM | POA: Diagnosis not present

## 2018-04-23 DIAGNOSIS — M545 Low back pain: Secondary | ICD-10-CM | POA: Diagnosis not present

## 2018-05-04 DIAGNOSIS — M545 Low back pain: Secondary | ICD-10-CM | POA: Diagnosis not present

## 2018-05-04 DIAGNOSIS — M79604 Pain in right leg: Secondary | ICD-10-CM | POA: Diagnosis not present

## 2018-05-04 DIAGNOSIS — R262 Difficulty in walking, not elsewhere classified: Secondary | ICD-10-CM | POA: Diagnosis not present

## 2018-05-04 DIAGNOSIS — M6281 Muscle weakness (generalized): Secondary | ICD-10-CM | POA: Diagnosis not present

## 2018-05-08 IMAGING — MG BREAST SURGICAL SPECIMEN
1 series · 1 of 1 positions shown · non-contrast
Comparison: Previous exam(s).

CLINICAL DATA: Status post lumpectomy for grade 2 invasive mammary
carcinoma of the right breast. Patient had preoperative radioactive
seed localization.

EXAM:
SPECIMEN RADIOGRAPH OF THE RIGHT BREAST

[R]
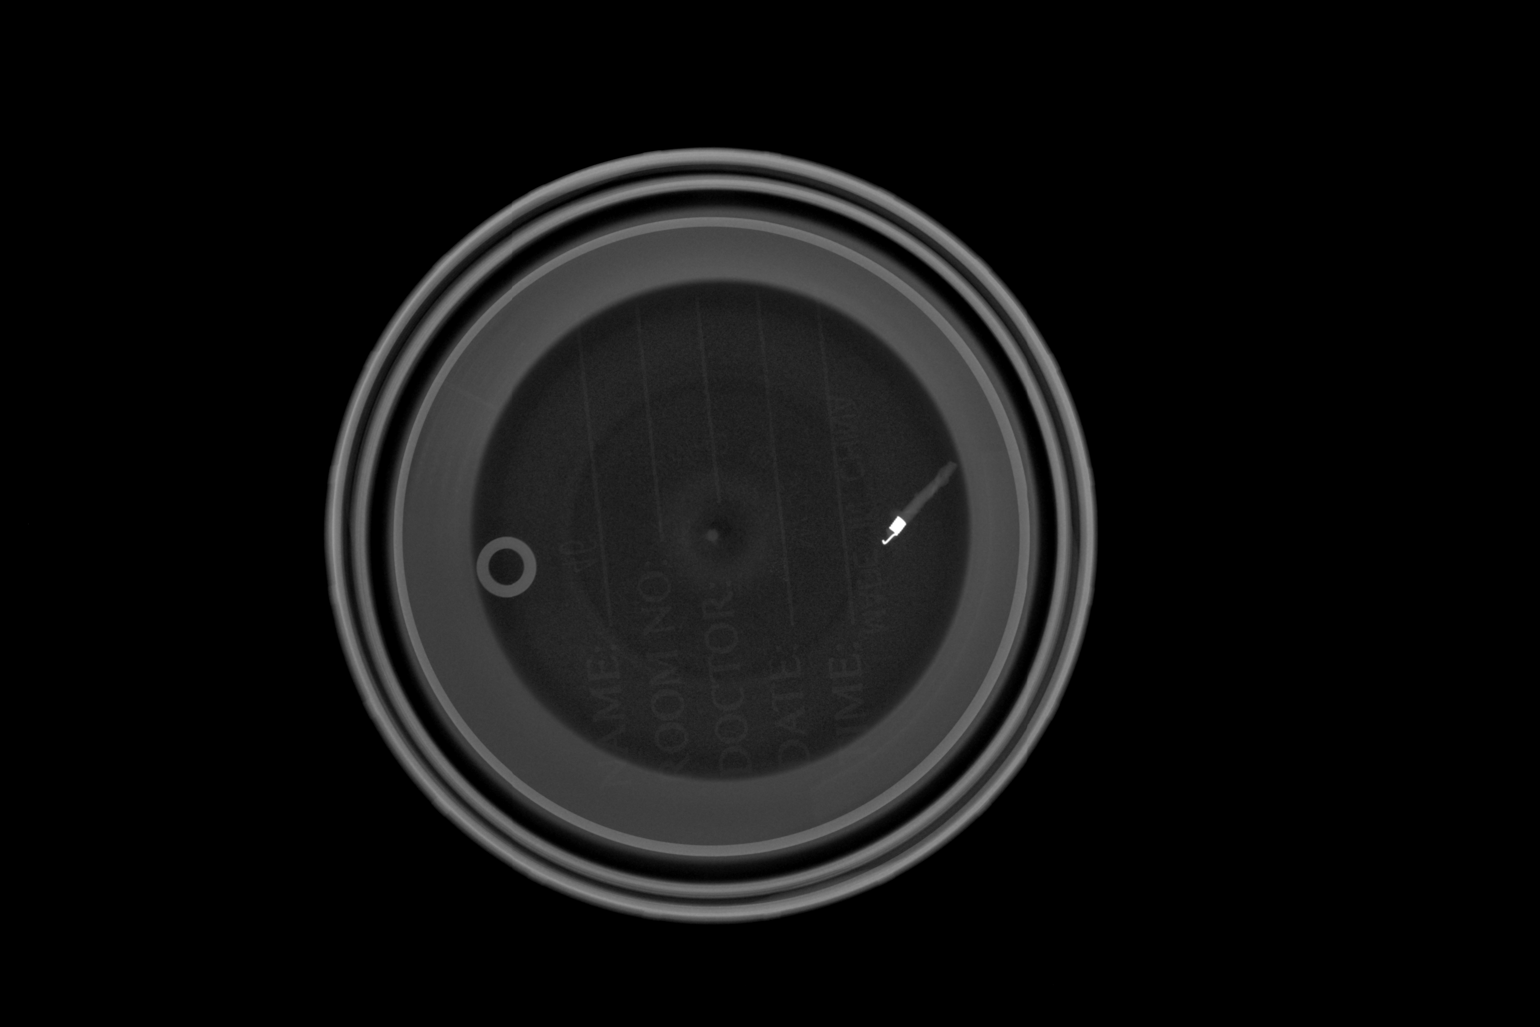

[1 of 1 positions shown; findings below may reference images not displayed]

FINDINGS: Status post excision of the right breast. The radioactive seed and
ribbon shaped marker clip are present within the lumpectomy spot,
completely intact, and were marked for pathology. In a separate
specimen, a coil shaped clip was removed, marking the site of a
benign fibroadenoma.
IMPRESSION: Specimen radiograph of the right breast. Findings are discussed with
Dr. J Jose at the time of surgery.

## 2018-05-11 DIAGNOSIS — M6281 Muscle weakness (generalized): Secondary | ICD-10-CM | POA: Diagnosis not present

## 2018-05-11 DIAGNOSIS — M79604 Pain in right leg: Secondary | ICD-10-CM | POA: Diagnosis not present

## 2018-05-11 DIAGNOSIS — R262 Difficulty in walking, not elsewhere classified: Secondary | ICD-10-CM | POA: Diagnosis not present

## 2018-05-11 DIAGNOSIS — M545 Low back pain: Secondary | ICD-10-CM | POA: Diagnosis not present

## 2018-05-14 DIAGNOSIS — M545 Low back pain: Secondary | ICD-10-CM | POA: Diagnosis not present

## 2018-05-14 DIAGNOSIS — M5440 Lumbago with sciatica, unspecified side: Secondary | ICD-10-CM | POA: Diagnosis not present

## 2018-05-14 DIAGNOSIS — R262 Difficulty in walking, not elsewhere classified: Secondary | ICD-10-CM | POA: Diagnosis not present

## 2018-05-14 DIAGNOSIS — M79604 Pain in right leg: Secondary | ICD-10-CM | POA: Diagnosis not present

## 2018-05-14 DIAGNOSIS — M6281 Muscle weakness (generalized): Secondary | ICD-10-CM | POA: Diagnosis not present

## 2018-05-22 DIAGNOSIS — M79604 Pain in right leg: Secondary | ICD-10-CM | POA: Diagnosis not present

## 2018-05-22 DIAGNOSIS — R262 Difficulty in walking, not elsewhere classified: Secondary | ICD-10-CM | POA: Diagnosis not present

## 2018-05-22 DIAGNOSIS — M6281 Muscle weakness (generalized): Secondary | ICD-10-CM | POA: Diagnosis not present

## 2018-05-22 DIAGNOSIS — M545 Low back pain: Secondary | ICD-10-CM | POA: Diagnosis not present

## 2018-06-01 DIAGNOSIS — M545 Low back pain: Secondary | ICD-10-CM | POA: Diagnosis not present

## 2018-06-01 DIAGNOSIS — M6281 Muscle weakness (generalized): Secondary | ICD-10-CM | POA: Diagnosis not present

## 2018-06-01 DIAGNOSIS — M79604 Pain in right leg: Secondary | ICD-10-CM | POA: Diagnosis not present

## 2018-06-01 DIAGNOSIS — R262 Difficulty in walking, not elsewhere classified: Secondary | ICD-10-CM | POA: Diagnosis not present

## 2018-06-06 IMAGING — DX DG CHEST 2V
2 series · 2 of 2 positions shown · non-contrast
Comparison: 12/17/2012.

CLINICAL DATA: Cough.  Shortness of breath.

EXAM:
CHEST  2 VIEW

[chest pa]
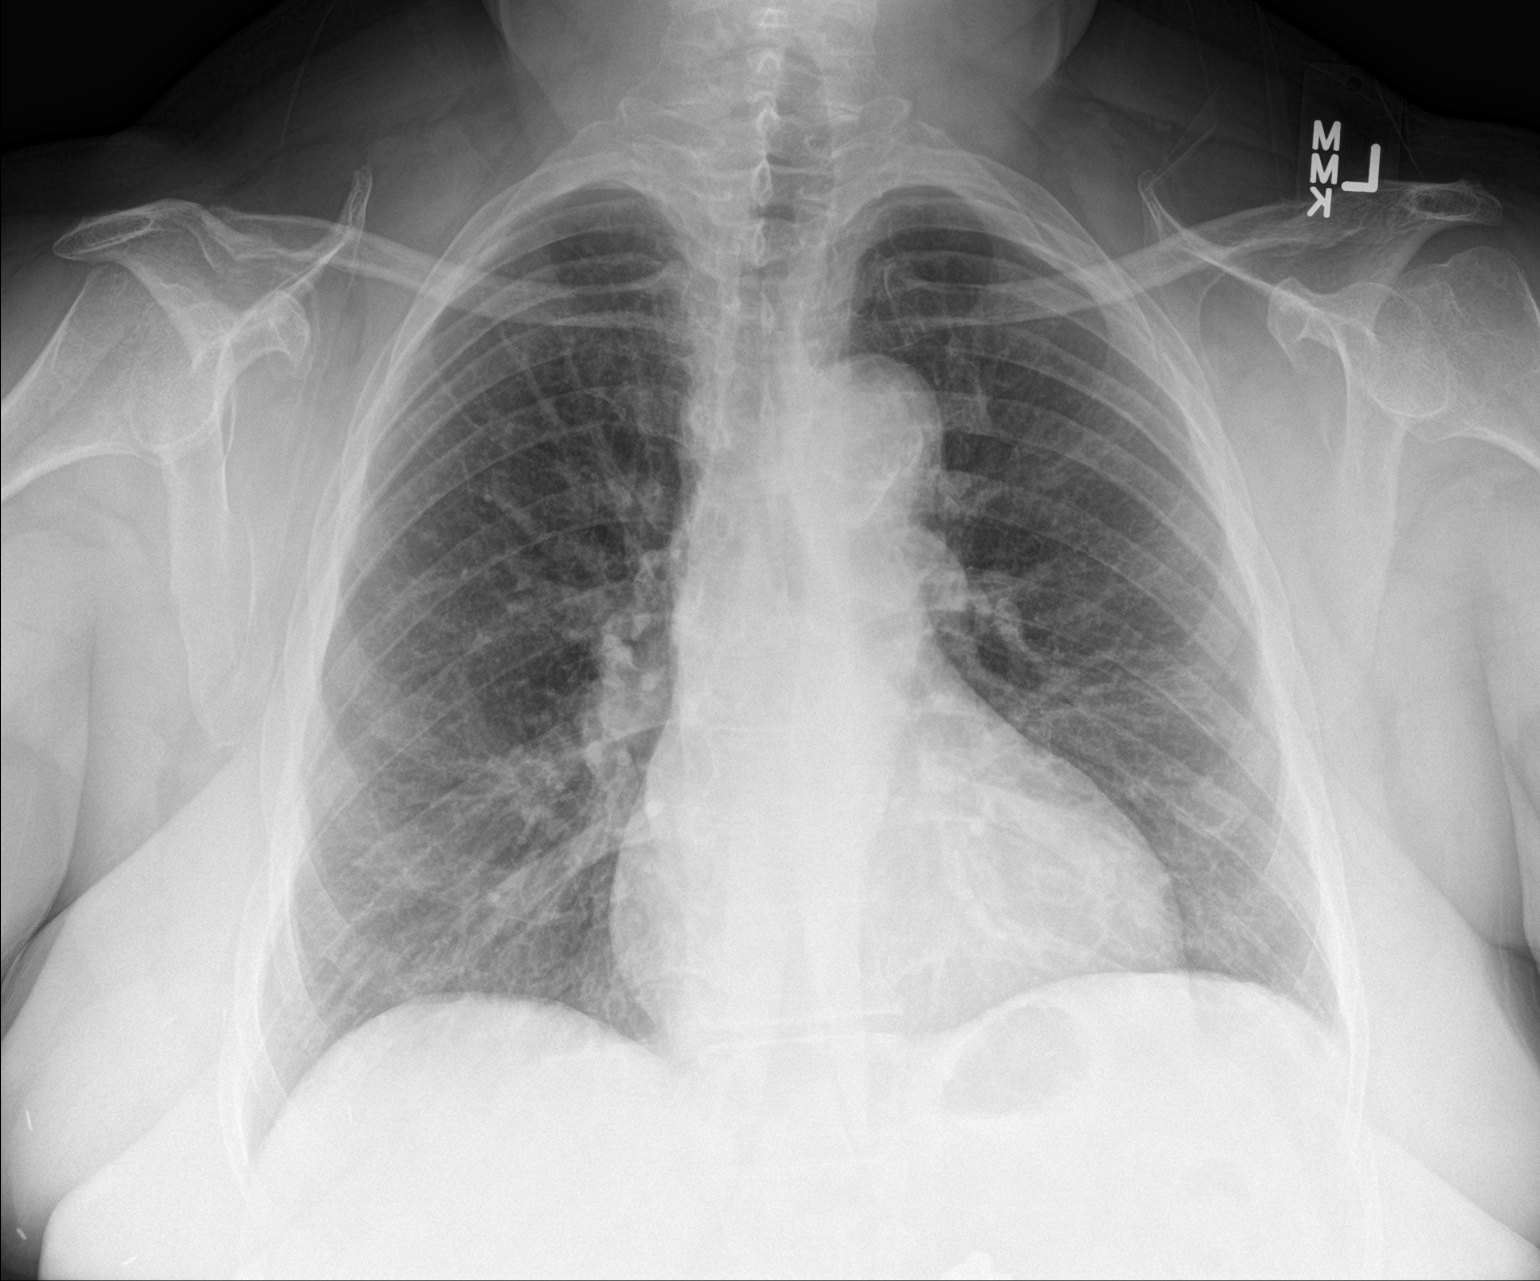

[chest lat]
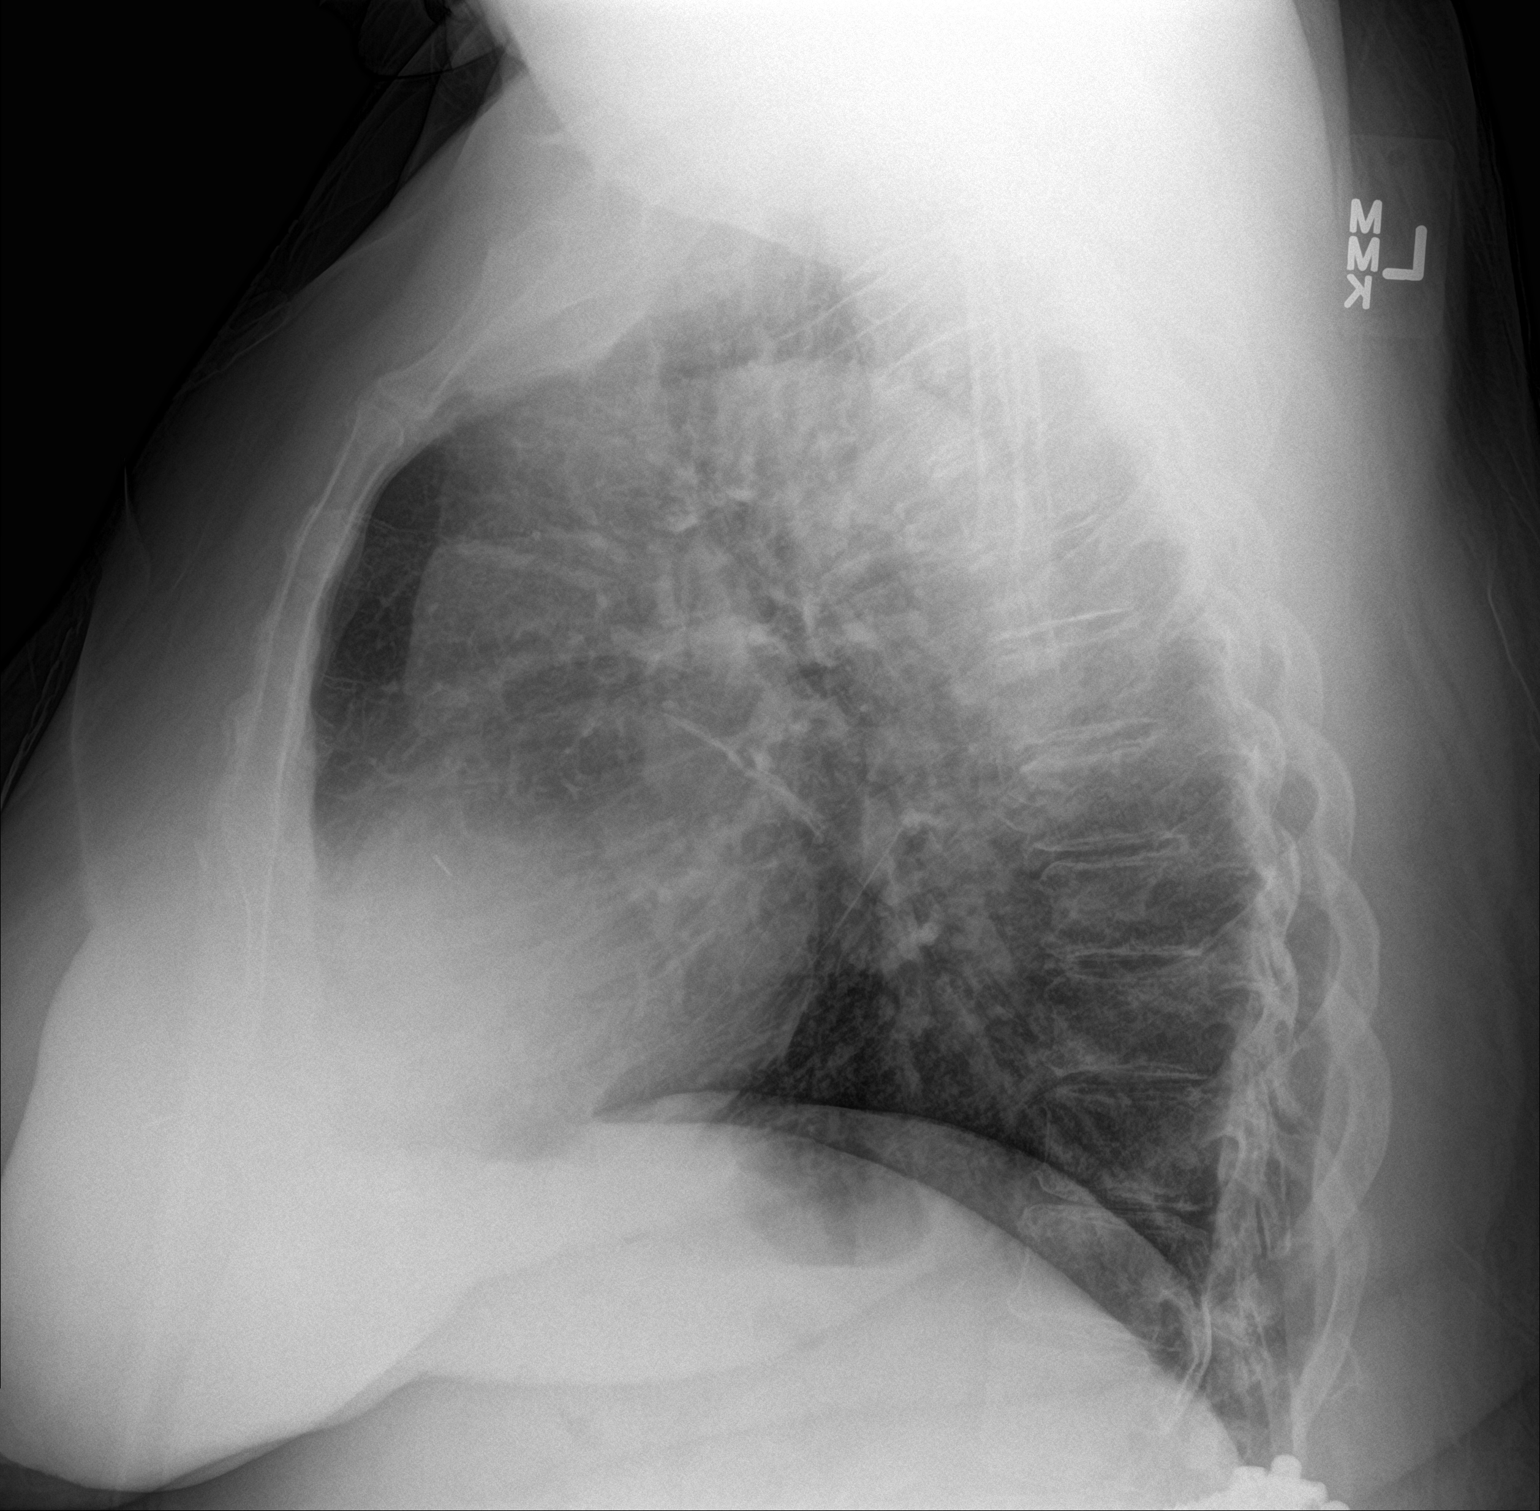

[2 of 2 positions shown; findings below may reference images not displayed]

FINDINGS: Mediastinum hilar structures normal. Mild cardiomegaly and pulmonary
venous could. No pulmonary edema. No focal infiltrate. Stable mild
elevation left hemidiaphragm. Prior lumbar spine fusion .
IMPRESSION: Cardiomegaly. Mild pulmonary venous congestion. No pulmonary edema.

## 2018-06-07 ENCOUNTER — Encounter: Payer: Self-pay | Admitting: Internal Medicine

## 2018-06-08 DIAGNOSIS — R262 Difficulty in walking, not elsewhere classified: Secondary | ICD-10-CM | POA: Diagnosis not present

## 2018-06-08 DIAGNOSIS — M545 Low back pain: Secondary | ICD-10-CM | POA: Diagnosis not present

## 2018-06-08 DIAGNOSIS — M79604 Pain in right leg: Secondary | ICD-10-CM | POA: Diagnosis not present

## 2018-06-08 DIAGNOSIS — M6281 Muscle weakness (generalized): Secondary | ICD-10-CM | POA: Diagnosis not present

## 2018-06-08 MED ORDER — METFORMIN HCL ER 500 MG PO TB24: 2000.0000 mg | ORAL_TABLET | Freq: Every day | ORAL | 1 refills | Status: DC

## 2018-06-15 DIAGNOSIS — M545 Low back pain: Secondary | ICD-10-CM | POA: Diagnosis not present

## 2018-06-15 DIAGNOSIS — M6281 Muscle weakness (generalized): Secondary | ICD-10-CM | POA: Diagnosis not present

## 2018-06-15 DIAGNOSIS — R262 Difficulty in walking, not elsewhere classified: Secondary | ICD-10-CM | POA: Diagnosis not present

## 2018-06-15 DIAGNOSIS — M79604 Pain in right leg: Secondary | ICD-10-CM | POA: Diagnosis not present

## 2018-06-19 DIAGNOSIS — R262 Difficulty in walking, not elsewhere classified: Secondary | ICD-10-CM | POA: Diagnosis not present

## 2018-06-19 DIAGNOSIS — M6281 Muscle weakness (generalized): Secondary | ICD-10-CM | POA: Diagnosis not present

## 2018-06-19 DIAGNOSIS — M79604 Pain in right leg: Secondary | ICD-10-CM | POA: Diagnosis not present

## 2018-06-19 DIAGNOSIS — M545 Low back pain: Secondary | ICD-10-CM | POA: Diagnosis not present

## 2018-06-22 DIAGNOSIS — M545 Low back pain: Secondary | ICD-10-CM | POA: Diagnosis not present

## 2018-06-22 DIAGNOSIS — R262 Difficulty in walking, not elsewhere classified: Secondary | ICD-10-CM | POA: Diagnosis not present

## 2018-06-22 DIAGNOSIS — M79604 Pain in right leg: Secondary | ICD-10-CM | POA: Diagnosis not present

## 2018-06-22 DIAGNOSIS — M6281 Muscle weakness (generalized): Secondary | ICD-10-CM | POA: Diagnosis not present

## 2018-06-24 ENCOUNTER — Encounter: Payer: Self-pay | Admitting: Internal Medicine

## 2018-06-24 ENCOUNTER — Ambulatory Visit (INDEPENDENT_AMBULATORY_CARE_PROVIDER_SITE_OTHER): Payer: Medicare Other | Admitting: Internal Medicine

## 2018-06-24 VITALS — BP 142/78 | HR 86 | Temp 98.6°F

## 2018-06-24 DIAGNOSIS — J4541 Moderate persistent asthma with (acute) exacerbation: Secondary | ICD-10-CM

## 2018-06-24 DIAGNOSIS — E7800 Pure hypercholesterolemia, unspecified: Secondary | ICD-10-CM

## 2018-06-24 DIAGNOSIS — R059 Cough, unspecified: Secondary | ICD-10-CM

## 2018-06-24 DIAGNOSIS — R062 Wheezing: Secondary | ICD-10-CM

## 2018-06-24 DIAGNOSIS — R05 Cough: Secondary | ICD-10-CM

## 2018-06-24 DIAGNOSIS — I10 Essential (primary) hypertension: Secondary | ICD-10-CM

## 2018-06-24 DIAGNOSIS — E119 Type 2 diabetes mellitus without complications: Secondary | ICD-10-CM | POA: Diagnosis not present

## 2018-06-24 MED ORDER — METHYLPREDNISOLONE ACETATE 80 MG/ML IJ SUSP
80.0000 mg | Freq: Once | INTRAMUSCULAR | Status: AC
  Administered 2018-06-24: 80 mg via INTRAMUSCULAR

## 2018-06-24 MED ORDER — LEVOFLOXACIN 500 MG PO TABS: 500.0000 mg | ORAL_TABLET | Freq: Every day | ORAL | 0 refills | Status: AC

## 2018-06-24 MED ORDER — PREDNISONE 10 MG PO TABS: ORAL_TABLET | ORAL | 0 refills | Status: DC

## 2018-06-24 MED ORDER — PROMETHAZINE-CODEINE 6.25-10 MG/5ML PO SYRP: 5.0000 mL | ORAL_SOLUTION | Freq: Four times a day (QID) | ORAL | 0 refills | Status: AC | PRN

## 2018-06-24 NOTE — Assessment & Plan Note (Signed)
Mild to mod, c/w bronchitis vs pna, declines cxr, for antibx course,  Cough med prn, to f/u any worsening symptoms or concerns 

## 2018-06-24 NOTE — Patient Instructions (Signed)
You had the steroid shot today  Please take all new medication as prescribed - the antibiotic, cough medicine, and prednisone  Please continue all other medications as before, and refills have been done if requested.  Please have the pharmacy call with any other refills you may need.  Please keep your appointments with your specialists as you may have planned   
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
## 2018-06-24 NOTE — Assessment & Plan Note (Signed)
stable overall by history and exam, recent data reviewed with pt, and pt to continue medical treatment as before,  to f/u any worsening symptoms or concerns  

## 2018-06-24 NOTE — Assessment & Plan Note (Signed)
/  Mild to mod, for predpac asd,  to f/u any worsening symptoms or concerns 

## 2018-06-26 DIAGNOSIS — M545 Low back pain: Secondary | ICD-10-CM | POA: Diagnosis not present

## 2018-06-26 DIAGNOSIS — M79604 Pain in right leg: Secondary | ICD-10-CM | POA: Diagnosis not present

## 2018-06-26 DIAGNOSIS — M6281 Muscle weakness (generalized): Secondary | ICD-10-CM | POA: Diagnosis not present

## 2018-06-26 DIAGNOSIS — R262 Difficulty in walking, not elsewhere classified: Secondary | ICD-10-CM | POA: Diagnosis not present

## 2018-06-28 ENCOUNTER — Encounter: Payer: Self-pay | Admitting: Internal Medicine

## 2018-07-03 DIAGNOSIS — M545 Low back pain: Secondary | ICD-10-CM | POA: Diagnosis not present

## 2018-07-03 DIAGNOSIS — R262 Difficulty in walking, not elsewhere classified: Secondary | ICD-10-CM | POA: Diagnosis not present

## 2018-07-03 DIAGNOSIS — M79604 Pain in right leg: Secondary | ICD-10-CM | POA: Diagnosis not present

## 2018-07-03 DIAGNOSIS — M6281 Muscle weakness (generalized): Secondary | ICD-10-CM | POA: Diagnosis not present

## 2018-07-05 ENCOUNTER — Other Ambulatory Visit: Payer: Self-pay | Admitting: Internal Medicine

## 2018-07-10 DIAGNOSIS — R262 Difficulty in walking, not elsewhere classified: Secondary | ICD-10-CM | POA: Diagnosis not present

## 2018-07-10 DIAGNOSIS — M545 Low back pain: Secondary | ICD-10-CM | POA: Diagnosis not present

## 2018-07-10 DIAGNOSIS — M6281 Muscle weakness (generalized): Secondary | ICD-10-CM | POA: Diagnosis not present

## 2018-07-10 DIAGNOSIS — M79604 Pain in right leg: Secondary | ICD-10-CM | POA: Diagnosis not present

## 2018-07-16 DIAGNOSIS — C50911 Malignant neoplasm of unspecified site of right female breast: Secondary | ICD-10-CM | POA: Diagnosis not present

## 2018-07-17 DIAGNOSIS — M79604 Pain in right leg: Secondary | ICD-10-CM | POA: Diagnosis not present

## 2018-07-17 DIAGNOSIS — R262 Difficulty in walking, not elsewhere classified: Secondary | ICD-10-CM | POA: Diagnosis not present

## 2018-07-17 DIAGNOSIS — M545 Low back pain: Secondary | ICD-10-CM | POA: Diagnosis not present

## 2018-07-17 DIAGNOSIS — M6281 Muscle weakness (generalized): Secondary | ICD-10-CM | POA: Diagnosis not present

## 2018-08-06 ENCOUNTER — Telehealth: Payer: Self-pay | Admitting: Cardiovascular Disease

## 2018-08-06 NOTE — Telephone Encounter (Signed)
New Message  Patient is returning call that she received. She is unable to do a virtual visit next week due to other obligations. I did schedule her for one on 4/27. She is aware that it may change.

## 2018-08-17 ENCOUNTER — Telehealth: Payer: Self-pay

## 2018-08-17 NOTE — Telephone Encounter (Signed)
LEFT VM FOR PT TO RETURN CALL TO MAKE APPT VIRTUAL. PREFERABLE DOXY.ME APPT NEEDS TO BE AT 10:30 AM

## 2018-08-21 ENCOUNTER — Encounter: Payer: Self-pay | Admitting: *Deleted

## 2018-08-21 ENCOUNTER — Telehealth: Payer: Self-pay | Admitting: Cardiovascular Disease

## 2018-08-21 NOTE — Telephone Encounter (Signed)
Call [phone]/ my chart/ consent/ pre reg completed

## 2018-08-24 ENCOUNTER — Telehealth: Payer: Self-pay | Admitting: *Deleted

## 2018-08-24 ENCOUNTER — Encounter: Payer: Self-pay | Admitting: Cardiovascular Disease

## 2018-08-24 ENCOUNTER — Telehealth (INDEPENDENT_AMBULATORY_CARE_PROVIDER_SITE_OTHER): Payer: Medicare Other | Admitting: Cardiovascular Disease

## 2018-08-24 DIAGNOSIS — E119 Type 2 diabetes mellitus without complications: Secondary | ICD-10-CM | POA: Diagnosis not present

## 2018-08-24 DIAGNOSIS — Z7409 Other reduced mobility: Secondary | ICD-10-CM | POA: Diagnosis not present

## 2018-08-24 DIAGNOSIS — I10 Essential (primary) hypertension: Secondary | ICD-10-CM

## 2018-08-24 DIAGNOSIS — Z952 Presence of prosthetic heart valve: Secondary | ICD-10-CM

## 2018-08-24 DIAGNOSIS — E7800 Pure hypercholesterolemia, unspecified: Secondary | ICD-10-CM | POA: Diagnosis not present

## 2018-08-24 DIAGNOSIS — J45909 Unspecified asthma, uncomplicated: Secondary | ICD-10-CM | POA: Diagnosis not present

## 2018-08-24 NOTE — Telephone Encounter (Signed)
Left message to call back to go over AVS from visit today

## 2018-08-24 NOTE — Patient Instructions (Addendum)
Medication Instructions:  Your physician recommends that you continue on your current medications as directed. Please refer to the Current Medication list given to you today.  If you need a refill on your cardiac medications before your next appointment, please call your pharmacy.   Lab work: NONE  Testing/Procedures: NONE  Follow-Up Your physician recommends that you schedule a follow-up appointment in:  WITH LUKE K PA 6/29 AT 10:00 AM  Your physician wants you to follow-up in: [place]  You will receive a reminder letter in the mail two months in advance. If you don't receive a letter, please call our office to schedule the follow-up appointment.   Any Other Special Instructions Will Be Listed Below (If Applicable). TRY TO EXERCISE 150 MINUTES EVERY WEEK  MONITOR AND LOG YOUR BLOOD PRESSURE  LIMIT YOUR SALT INTAKE TO 2 GRAMS OR 2,000 MG DAILY   Low-Sodium Eating Plan Sodium, which is an element that makes up salt, helps you maintain a healthy balance of fluids in your body. Too much sodium can increase your blood pressure and cause fluid and waste to be held in your body. Your health care provider or dietitian may recommend following this plan if you have high blood pressure (hypertension), kidney disease, liver disease, or heart failure. Eating less sodium can help lower your blood pressure, reduce swelling, and protect your heart, liver, and kidneys. What are tips for following this plan? General guidelines  Most people on this plan should limit their sodium intake to 1,500-2,000 mg (milligrams) of sodium each day. Reading food labels   The Nutrition Facts label lists the amount of sodium in one serving of the food. If you eat more than one serving, you must multiply the listed amount of sodium by the number of servings.  Choose foods with less than 140 mg of sodium per serving.  Avoid foods with 300 mg of sodium or more per serving. Shopping  Look for  lower-sodium products, often labeled as "low-sodium" or "no salt added."  Always check the sodium content even if foods are labeled as "unsalted" or "no salt added".  Buy fresh foods. ? Avoid canned foods and premade or frozen meals. ? Avoid canned, cured, or processed meats  Buy breads that have less than 80 mg of sodium per slice. Cooking  Eat more home-cooked food and less restaurant, buffet, and fast food.  Avoid adding salt when cooking. Use salt-free seasonings or herbs instead of table salt or sea salt. Check with your health care provider or pharmacist before using salt substitutes.  Cook with plant-based oils, such as canola, sunflower, or olive oil. Meal planning  When eating at a restaurant, ask that your food be prepared with less salt or no salt, if possible.  Avoid foods that contain MSG (monosodium glutamate). MSG is sometimes added to [place] food, bouillon, and some canned foods. What foods are recommended? The items listed may not be a complete list. Talk with your dietitian about what dietary choices are best for you. Grains Low-sodium cereals, including oats, puffed wheat and rice, and shredded wheat. Low-sodium crackers. Unsalted rice. Unsalted pasta. Low-sodium bread. Whole-grain breads and whole-grain pasta. Vegetables Fresh or frozen vegetables. "No salt added" canned vegetables. "No salt added" tomato sauce and paste. Low-sodium or reduced-sodium tomato and vegetable juice. Fruits Fresh, frozen, or canned fruit. Fruit juice. Meats and other protein foods Fresh or frozen (no salt added) meat, poultry, seafood, and fish. Low-sodium canned tuna and salmon. Unsalted nuts. Dried  peas, beans, and lentils without added salt. Unsalted canned beans. Eggs. Unsalted nut butters. Dairy Milk. Soy milk. Cheese that is naturally low in sodium, such as ricotta cheese, fresh mozzarella, or Swiss cheese Low-sodium or reduced-sodium cheese. Cream cheese. Yogurt. Fats and  oils Unsalted butter. Unsalted margarine with no trans fat. Vegetable oils such as canola or olive oils. Seasonings and other foods Fresh and dried herbs and spices. Salt-free seasonings. Low-sodium mustard and ketchup. Sodium-free salad dressing. Sodium-free light mayonnaise. Fresh or refrigerated horseradish. Lemon juice. Vinegar. Homemade, reduced-sodium, or low-sodium soups. Unsalted popcorn and pretzels. Low-salt or salt-free chips. What foods are not recommended? The items listed may not be a complete list. Talk with your dietitian about what dietary choices are best for you. Grains Instant hot cereals. Bread stuffing, pancake, and biscuit mixes. Croutons. Seasoned rice or pasta mixes. Noodle soup cups. Boxed or frozen macaroni and cheese. Regular salted crackers. Self-rising flour. Vegetables Sauerkraut, pickled vegetables, and relishes. Olives. [place] fries. Onion rings. Regular canned vegetables (not low-sodium or reduced-sodium). Regular canned tomato sauce and paste (not low-sodium or reduced-sodium). Regular tomato and vegetable juice (not low-sodium or reduced-sodium). Frozen vegetables in sauces. Meats and other protein foods Meat or fish that is salted, canned, smoked, spiced, or pickled. Bacon, ham, sausage, hotdogs, corned beef, chipped beef, packaged lunch meats, salt pork, jerky, pickled herring, anchovies, regular canned tuna, sardines, salted nuts. Dairy Processed cheese and cheese spreads. Cheese curds. Blue cheese. Feta cheese. String cheese. Regular cottage cheese. Buttermilk. Canned milk. Fats and oils Salted butter. Regular margarine. Ghee. Bacon fat. Seasonings and other foods Onion salt, garlic salt, seasoned salt, table salt, and sea salt. Canned and packaged gravies. Worcestershire sauce. Tartar sauce. Barbecue sauce. Teriyaki sauce. Soy sauce, including reduced-sodium. Steak sauce. Fish sauce. Oyster sauce. Cocktail sauce. Horseradish that you find on the shelf.  Regular ketchup and mustard. Meat flavorings and tenderizers. Bouillon cubes. Hot sauce and Tabasco sauce. Premade or packaged marinades. Premade or packaged taco seasonings. Relishes. Regular salad dressings. Salsa. Potato and tortilla chips. Corn chips and puffs. Salted popcorn and pretzels. Canned or dried soups. Pizza. Frozen entrees and pot pies. Summary  Eating less sodium can help lower your blood pressure, reduce swelling, and protect your heart, liver, and kidneys.  Most people on this plan should limit their sodium intake to 1,500-2,000 mg (milligrams) of sodium each day.  Canned, boxed, and frozen foods are high in sodium. Restaurant foods, fast foods, and pizza are also very high in sodium. You also get sodium by adding salt to food.  Try to cook at home, eat more fresh fruits and vegetables, and eat less fast food, canned, processed, or prepared foods. This information is not intended to replace advice given to you by your health care provider. Make sure you discuss any questions you have with your health care provider. Document Released: 10/05/2001 Document Revised: 04/08/2016 Document Reviewed: 04/08/2016 Elsevier Interactive Patient Education  2019 [employer].
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
## 2018-08-24 NOTE — Telephone Encounter (Signed)
Reviewed AVS and scheduled 2 month follow up

## 2018-09-03 ENCOUNTER — Encounter: Payer: Self-pay | Admitting: Hematology and Oncology

## 2018-09-10 DIAGNOSIS — M542 Cervicalgia: Secondary | ICD-10-CM | POA: Diagnosis not present

## 2018-09-10 DIAGNOSIS — M5137 Other intervertebral disc degeneration, lumbosacral region: Secondary | ICD-10-CM | POA: Diagnosis not present

## 2018-09-15 ENCOUNTER — Telehealth: Payer: Self-pay | Admitting: *Deleted

## 2018-09-15 DIAGNOSIS — R600 Localized edema: Secondary | ICD-10-CM

## 2018-09-15 DIAGNOSIS — Z5181 Encounter for therapeutic drug level monitoring: Secondary | ICD-10-CM

## 2018-09-15 NOTE — Telephone Encounter (Signed)
Mychart message sent to patient and message sent to scheduling to arrange   Recommend getting LE venous Doppler to look for DVT. Check BMP and BNP while she is here. Compression stockings when she is at work and having to sit for prolonged periods.    ~Tiffany  ----- Message -----  From: Meryl Crutch, RN  Sent: 09/11/2018  8:05 AM EDT  To: Skeet Latch, MD, Earvin Hansen, LPN  Subject: FW: Visit Follow-Up Question               ----- Message -----  From: [name]  Sent: 09/10/2018  6:51 PM EDT  To: Cv Div Nl Triage  Subject: Visit Follow-Up Question               I started 80mg  lasix bid, like requested Sat 5/9, the last photo is this morning after work.

## 2018-09-16 ENCOUNTER — Ambulatory Visit (HOSPITAL_COMMUNITY)
Admission: RE | Admit: 2018-09-16 | Discharge: 2018-09-16 | Disposition: A | Discharge status: Home / Self Care | Payer: Medicare Other | Source: Ambulatory Visit | Attending: Cardiology | Admitting: Cardiology

## 2018-09-16 ENCOUNTER — Other Ambulatory Visit: Payer: Self-pay

## 2018-09-16 ENCOUNTER — Encounter (HOSPITAL_COMMUNITY): Payer: Self-pay

## 2018-09-16 DIAGNOSIS — I10 Essential (primary) hypertension: Secondary | ICD-10-CM

## 2018-09-16 DIAGNOSIS — R600 Localized edema: Secondary | ICD-10-CM | POA: Diagnosis not present

## 2018-09-16 DIAGNOSIS — I350 Nonrheumatic aortic (valve) stenosis: Secondary | ICD-10-CM

## 2018-09-16 NOTE — Telephone Encounter (Signed)
Patient scheduled for doppler today at 1:00 pm

## 2018-09-16 NOTE — Progress Notes (Unsigned)
Bilateral lower venous has been completed and is negative for DVT.  Preliminary results can be found under CV proc through chart review.  Cira Servant, RVT [facility]

## 2018-09-16 NOTE — Progress Notes (Signed)
Met

## 2018-09-17 LAB — BASIC METABOLIC PANEL
BUN/Creatinine Ratio: 18 (ref 12–28)
BUN: 12 mg/dL (ref 8–27)
CO2: 24 mmol/L (ref 20–29)
Calcium: 9.7 mg/dL (ref 8.7–10.3)
Chloride: 105 mmol/L (ref 96–106)
Creatinine, Ser: 0.68 mg/dL (ref 0.57–1.00)
GFR calc Af Amer: 100 mL/min/{1.73_m2} (ref >59)
GFR calc non Af Amer: 87 mL/min/{1.73_m2} (ref >59)
Glucose: 92 mg/dL (ref 65–99)
Potassium: 4.3 mmol/L (ref 3.5–5.2)
Sodium: 144 mmol/L (ref 134–144)

## 2018-09-17 LAB — BRAIN NATRIURETIC PEPTIDE: BNP: 41.4 pg/mL (ref 0.0–100.0)

## 2018-09-29 ENCOUNTER — Other Ambulatory Visit: Payer: Self-pay | Admitting: Internal Medicine

## 2018-10-09 ENCOUNTER — Encounter

## 2018-10-15 ENCOUNTER — Telehealth: Payer: Self-pay | Admitting: Internal Medicine

## 2018-10-15 MED ORDER — METFORMIN HCL ER 500 MG PO TB24: 2000.0000 mg | ORAL_TABLET | Freq: Every day | ORAL | 1 refills | Status: DC

## 2018-10-15 NOTE — Telephone Encounter (Signed)
[facility] on [address] informed pt of recall on metFORMIN (GLUCOPHAGE-XR) 500 MG 24 hr tablet and to contact the doctor for a change of medication.  Please advise.  Call home # but if no answer please call cell #.

## 2018-10-16 ENCOUNTER — Encounter: Payer: Self-pay | Admitting: Internal Medicine

## 2018-10-16 MED ORDER — METFORMIN HCL 500 MG PO TABS: 1000.0000 mg | ORAL_TABLET | Freq: Two times a day (BID) | ORAL | 3 refills | Status: DC

## 2018-10-20 ENCOUNTER — Ambulatory Visit (INDEPENDENT_AMBULATORY_CARE_PROVIDER_SITE_OTHER): Payer: Medicare Other | Admitting: Internal Medicine

## 2018-10-20 ENCOUNTER — Encounter: Payer: Self-pay | Admitting: Internal Medicine

## 2018-10-20 ENCOUNTER — Other Ambulatory Visit: Payer: Self-pay

## 2018-10-20 ENCOUNTER — Other Ambulatory Visit (INDEPENDENT_AMBULATORY_CARE_PROVIDER_SITE_OTHER): Payer: Medicare Other

## 2018-10-20 VITALS — BP 144/88 | HR 85 | Temp 98.3°F | Ht 61.0 in | Wt 232.0 lb

## 2018-10-20 DIAGNOSIS — E538 Deficiency of other specified B group vitamins: Secondary | ICD-10-CM | POA: Diagnosis not present

## 2018-10-20 DIAGNOSIS — E559 Vitamin D deficiency, unspecified: Secondary | ICD-10-CM | POA: Diagnosis not present

## 2018-10-20 DIAGNOSIS — I10 Essential (primary) hypertension: Secondary | ICD-10-CM

## 2018-10-20 DIAGNOSIS — Z23 Encounter for immunization: Secondary | ICD-10-CM

## 2018-10-20 DIAGNOSIS — E611 Iron deficiency: Secondary | ICD-10-CM

## 2018-10-20 DIAGNOSIS — E119 Type 2 diabetes mellitus without complications: Secondary | ICD-10-CM

## 2018-10-20 DIAGNOSIS — E7800 Pure hypercholesterolemia, unspecified: Secondary | ICD-10-CM | POA: Diagnosis not present

## 2018-10-20 LAB — URINALYSIS, ROUTINE W REFLEX MICROSCOPIC
Bilirubin Urine: NEGATIVE
Hgb urine dipstick: NEGATIVE
Ketones, ur: NEGATIVE
Leukocytes,Ua: NEGATIVE
Nitrite: NEGATIVE
RBC / HPF: NONE SEEN (ref 0–?)
Specific Gravity, Urine: 1.015 (ref 1.000–1.030)
Total Protein, Urine: NEGATIVE
Urine Glucose: NEGATIVE
Urobilinogen, UA: 0.2 (ref 0.0–1.0)
pH: 6 (ref 5.0–8.0)

## 2018-10-20 LAB — CBC WITH DIFFERENTIAL/PLATELET
Basophils Absolute: 0.1 10*3/uL (ref 0.0–0.1)
Basophils Relative: 1.2 % (ref 0.0–3.0)
Eosinophils Absolute: 0.3 10*3/uL (ref 0.0–0.7)
Eosinophils Relative: 2.7 % (ref 0.0–5.0)
HCT: 42.2 % (ref 36.0–46.0)
Hemoglobin: 13.8 g/dL (ref 12.0–15.0)
Lymphocytes Relative: 25.9 % (ref 12.0–46.0)
Lymphs Abs: 2.6 10*3/uL (ref 0.7–4.0)
MCHC: 32.7 g/dL (ref 30.0–36.0)
MCV: 81.9 fl (ref 78.0–100.0)
Monocytes Absolute: 0.7 10*3/uL (ref 0.1–1.0)
Monocytes Relative: 7 % (ref 3.0–12.0)
Neutro Abs: 6.4 10*3/uL (ref 1.4–7.7)
Neutrophils Relative %: 63.2 % (ref 43.0–77.0)
Platelets: 201 10*3/uL (ref 150.0–400.0)
RBC: 5.15 Mil/uL — ABNORMAL HIGH (ref 3.87–5.11)
RDW: 15.7 % — ABNORMAL HIGH (ref 11.5–15.5)
WBC: 10.1 10*3/uL (ref 4.0–10.5)

## 2018-10-20 LAB — VITAMIN B12: Vitamin B-12: 591 pg/mL (ref 211–911)

## 2018-10-20 LAB — MICROALBUMIN / CREATININE URINE RATIO
Creatinine,U: 28.1 mg/dL
Microalb Creat Ratio: 3.2 mg/g (ref 0.0–30.0)
Microalb, Ur: 0.9 mg/dL (ref 0.0–1.9)

## 2018-10-20 LAB — LIPID PANEL
Cholesterol: 179 mg/dL (ref 0–200)
HDL: 68.3 mg/dL (ref >39.00)
LDL Cholesterol: 86 mg/dL (ref 0–99)
NonHDL: 110.57
Total CHOL/HDL Ratio: 3
Triglycerides: 122 mg/dL (ref 0.0–149.0)
VLDL: 24.4 mg/dL (ref 0.0–40.0)

## 2018-10-20 LAB — BASIC METABOLIC PANEL
BUN: 9 mg/dL (ref 6–23)
CO2: 30 mEq/L (ref 19–32)
Calcium: 9.8 mg/dL (ref 8.4–10.5)
Chloride: 104 mEq/L (ref 96–112)
Creatinine, Ser: 0.68 mg/dL (ref 0.40–1.20)
GFR: 102.55 mL/min (ref >60.00)
Glucose, Bld: 121 mg/dL — ABNORMAL HIGH (ref 70–99)
Potassium: 3.7 mEq/L (ref 3.5–5.1)
Sodium: 141 mEq/L (ref 135–145)

## 2018-10-20 LAB — VITAMIN D 25 HYDROXY (VIT D DEFICIENCY, FRACTURES): VITD: 56.34 ng/mL (ref 30.00–100.00)

## 2018-10-20 LAB — HEPATIC FUNCTION PANEL
ALT: 18 U/L (ref 0–35)
AST: 18 U/L (ref 0–37)
Albumin: 4.5 g/dL (ref 3.5–5.2)
Alkaline Phosphatase: 69 U/L (ref 39–117)
Bilirubin, Direct: 0.2 mg/dL (ref 0.0–0.3)
Total Bilirubin: 0.8 mg/dL (ref 0.2–1.2)
Total Protein: 7.6 g/dL (ref 6.0–8.3)

## 2018-10-20 LAB — IBC PANEL
Iron: 65 ug/dL (ref 42–145)
Saturation Ratios: 17.9 % — ABNORMAL LOW (ref 20.0–50.0)
Transferrin: 260 mg/dL (ref 212.0–360.0)

## 2018-10-20 LAB — TSH: TSH: 0.95 u[IU]/mL (ref 0.35–4.50)

## 2018-10-20 LAB — HEMOGLOBIN A1C: Hgb A1c MFr Bld: 7.2 % — ABNORMAL HIGH (ref 4.6–6.5)

## 2018-10-20 MED ORDER — CETIRIZINE HCL 10 MG PO TABS: ORAL_TABLET | ORAL | 3 refills | Status: DC

## 2018-10-20 MED ORDER — CETIRIZINE HCL 10 MG PO TABS: ORAL_TABLET | ORAL | 3 refills | Status: AC

## 2018-10-20 MED ORDER — FUROSEMIDE 40 MG PO TABS: 40.0000 mg | ORAL_TABLET | Freq: Two times a day (BID) | ORAL | 3 refills | Status: DC
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
## 2018-10-20 NOTE — Patient Instructions (Addendum)
You had the Tdap tetanus shot today  Please continue all other medications as before, and refills have been done if requested.  Please have the pharmacy call with any other refills you may need.  Please continue your efforts at being more active, low cholesterol diet, and weight control.  You are otherwise up to date with prevention measures today.  Please keep your appointments with your specialists as you may have planned  Please go to the LAB in the Basement (turn left off the elevator) for the tests to be done today  You will be contacted by phone if any changes need to be made immediately.  Otherwise, you will receive a letter about your results with an explanation, but please check with MyChart first.  Please remember to sign up for MyChart if you have not done so, as this will be important to you in the future with finding out test results, communicating by private email, and scheduling acute appointments online when needed.  Please return in 6 months, or sooner if needed 

## 2018-10-22 ENCOUNTER — Encounter: Payer: Self-pay | Admitting: Internal Medicine

## 2018-10-22 ENCOUNTER — Telehealth: Payer: Self-pay | Admitting: Cardiology

## 2018-10-22 NOTE — Telephone Encounter (Signed)
Patient returned your call. Please call back at her home number.

## 2018-10-22 NOTE — Telephone Encounter (Signed)
Patient called back/ consent/ my chart/ pre reg completed

## 2018-10-23 ENCOUNTER — Encounter: Payer: Self-pay | Admitting: Internal Medicine

## 2018-10-23 NOTE — Assessment & Plan Note (Signed)
stable overall by history and exam, recent data reviewed with pt, and pt to continue medical treatment as before,  to f/u any worsening symptoms or concerns  

## 2018-10-26 ENCOUNTER — Encounter: Payer: Self-pay | Admitting: Cardiology

## 2018-10-26 ENCOUNTER — Telehealth (INDEPENDENT_AMBULATORY_CARE_PROVIDER_SITE_OTHER): Payer: Medicare Other | Admitting: Cardiology

## 2018-10-26 ENCOUNTER — Other Ambulatory Visit: Payer: Self-pay | Admitting: Internal Medicine

## 2018-10-26 ENCOUNTER — Telehealth: Payer: Self-pay

## 2018-10-26 VITALS — BP 134/80 | HR 73 | Ht 61.0 in | Wt 233.0 lb

## 2018-10-26 DIAGNOSIS — E119 Type 2 diabetes mellitus without complications: Secondary | ICD-10-CM | POA: Diagnosis not present

## 2018-10-26 DIAGNOSIS — I10 Essential (primary) hypertension: Secondary | ICD-10-CM

## 2018-10-26 DIAGNOSIS — Z952 Presence of prosthetic heart valve: Secondary | ICD-10-CM

## 2018-10-26 NOTE — Patient Instructions (Signed)
Medication Instructions:  Your physician recommends that you continue on your current medications as directed. Please refer to the Current Medication list given to you today. If you need a refill on your cardiac medications before your next appointment, please call your pharmacy.   Lab work: None  If you have labs (blood work) drawn today and your tests are completely normal, you will receive your results only by: Marland Kitchen MyChart Message (if you have MyChart) OR . A paper copy in the mail If you have any lab test that is abnormal or we need to change your treatment, we will call you to review the results.  Testing/Procedures: None   Follow-Up: At [facility], you and your health needs are our priority.  As part of our continuing mission to provide you with exceptional heart care, we have created designated Provider Care Teams.  These Care Teams include your primary Cardiologist (physician) and Advanced Practice Providers (APPs -  Physician Assistants and Nurse Practitioners) who all work together to provide you with the care you need, when you need it. You will need a follow up appointment in 4 months.  Please call our office 2 months in advance to schedule this appointment.  You may see Skeet Latch, MD or one of the following Advanced Practice Providers on your designated Care Team:   Kerin Ransom, PA-C Roby Lofts, [place] . Sande Rives, PA-C  Any Other Special Instructions Will Be Listed Below (If Applicable). Nurse visit is scheduled to have your blood pressure checked. Make sure to bring your cuff with you so we can compare readings.
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
## 2018-10-26 NOTE — Telephone Encounter (Signed)
VERBAL CONSENT GIVEN TO NICOLE CURTIS ON 10/22/2018 AT 4:25PM.      Virtual Visit Pre-Appointment Phone Call  "[name], I am calling you today to discuss your upcoming appointment. We are currently trying to limit exposure to the virus that causes COVID-19 by seeing patients at home rather than in the office."  1. "What is the BEST phone number to call the day of the visit?" - include this in appointment notes  2. "Do you have or have access to (through a family member/friend) a smartphone with video capability that we can use for your visit?" a. If yes - list this number in appt notes as "cell" (if different from BEST phone #) and list the appointment type as a VIDEO visit in appointment notes b. If no - list the appointment type as a PHONE visit in appointment notes  3. Confirm consent - "In the setting of the current Covid19 crisis, you are scheduled for a (phone or video) visit with your provider on (date) at (time).  Just as we do with many in-office visits, in order for you to participate in this visit, we must obtain consent.  If you'd like, I can send this to your mychart (if signed up) or email for you to review.  Otherwise, I can obtain your verbal consent now.  All virtual visits are billed to your insurance company just like a normal visit would be.  By agreeing to a virtual visit, we'd like you to understand that the technology does not allow for your provider to perform an examination, and thus may limit your provider's ability to fully assess your condition. If your provider identifies any concerns that need to be evaluated in person, we will make arrangements to do so.  Finally, though the technology is pretty good, we cannot assure that it will always work on either your or our end, and in the setting of a video visit, we may have to convert it to a phone-only visit.  In either situation, we cannot ensure that we have a secure connection.  Are you willing to proceed?" STAFF: Did  the patient verbally acknowledge consent to telehealth visit? Document YES/NO here: YES  4. Advise patient to be prepared - "Two hours prior to your appointment, go ahead and check your blood pressure, pulse, oxygen saturation, and your weight (if you have the equipment to check those) and write them all down. When your visit starts, your provider will ask you for this information. If you have an Apple Watch or Kardia device, please plan to have heart rate information ready on the day of your appointment. Please have a pen and paper handy nearby the day of the visit as well."  5. Give patient instructions for MyChart download to smartphone OR Doximity/Doxy.me as below if video visit (depending on what platform provider is using)  6. Inform patient they will receive a phone call 15 minutes prior to their appointment time (may be from unknown caller ID) so they should be prepared to answer    TELEPHONE CALL NOTE  Denise [name] has been deemed a candidate for a follow-up tele-health visit to limit community exposure during the Covid-19 pandemic. I spoke with the patient via phone to ensure availability of phone/video source, confirm preferred email & phone number, and discuss instructions and expectations.  I reminded Denise [name] to be prepared with any vital sign and/or heart rhythm information that could potentially be obtained via home monitoring, at the time of her visit.  I reminded Denise [name] to expect a phone call prior to her visit.  Denise [name], [place] 10/26/2018 10:19 AM   INSTRUCTIONS FOR DOWNLOADING THE MYCHART APP TO SMARTPHONE  - The patient must first make sure to have activated MyChart and know their login information - If Apple, go to [employer] and type in MyChart in the search bar and download the app. If Android, ask patient to go to [employer] and type in [facility] in the search bar and download the app. The app is free but as with any other  app downloads, their phone may require them to verify saved payment information or Apple/Android password.  - The patient will need to then log into the app with their MyChart username and password, and select [facility] as their healthcare provider to link the account. When it is time for your visit, go to the MyChart app, find appointments, and click Begin Video Visit. Be sure to Select Allow for your device to access the Microphone and Camera for your visit. You will then be connected, and your provider will be with you shortly.  **If they have any issues connecting, or need assistance please contact MyChart service desk [phone])**  **If using a computer, in order to ensure the best quality for their visit they will need to use either of the following Internet Browsers: [employer], or Google Chrome**  IF USING DOXIMITY or DOXY.ME - The patient will receive a link just prior to their visit by text.     FULL LENGTH CONSENT FOR TELE-HEALTH VISIT   I hereby voluntarily request, consent and authorize [facility] and its employed or contracted physicians, physician assistants, nurse practitioners or other licensed health care professionals (the Practitioner), to provide me with telemedicine health care services (the "Services") as deemed necessary by the treating Practitioner. I acknowledge and consent to receive the Services by the Practitioner via telemedicine. I understand that the telemedicine visit will involve communicating with the Practitioner through live audiovisual communication technology and the disclosure of certain medical information by electronic transmission. I acknowledge that I have been given the opportunity to request an in-person assessment or other available alternative prior to the telemedicine visit and am voluntarily participating in the telemedicine visit.  I understand that I have the right to withhold or withdraw my consent to the use of  telemedicine in the course of my care at any time, without affecting my right to future care or treatment, and that the Practitioner or I may terminate the telemedicine visit at any time. I understand that I have the right to inspect all information obtained and/or recorded in the course of the telemedicine visit and may receive copies of available information for a reasonable fee.  I understand that some of the potential risks of receiving the Services via telemedicine include:  Marland Kitchen Delay or interruption in medical evaluation due to technological equipment failure or disruption; . Information transmitted may not be sufficient (e.g. poor resolution of images) to allow for appropriate medical decision making by the Practitioner; and/or  . In rare instances, security protocols could fail, causing a breach of personal health information.  Furthermore, I acknowledge that it is my responsibility to provide information about my medical history, conditions and care that is complete and accurate to the best of my ability. I acknowledge that Practitioner's advice, recommendations, and/or decision may be based on factors not within their control, such as incomplete or inaccurate data provided by me or distortions  of diagnostic images or specimens that may result from electronic transmissions. I understand that the practice of medicine is not an exact science and that Practitioner makes no warranties or guarantees regarding treatment outcomes. I acknowledge that I will receive a copy of this consent concurrently upon execution via email to the email address I last provided but may also request a printed copy by calling the office of [facility].    I understand that my insurance will be billed for this visit.   I have read or had this consent read to me. . I understand the contents of this consent, which adequately explains the benefits and risks of the Services being provided via telemedicine.  . I have been  provided ample opportunity to ask questions regarding this consent and the Services and have had my questions answered to my satisfaction. . I give my informed consent for the services to be provided through the use of telemedicine in my medical care  By participating in this telemedicine visit I agree to the above.

## 2018-10-26 NOTE — Telephone Encounter (Signed)
Contacted patient to discuss AVS Instructions. Gave patient Luke's recommendations from today's virtual office visit. Informed patient that someone from the scheduling dept will be in contact with her to schedule her follow up appt. Patient voiced understanding and AVS mailed.    

## 2018-11-02 ENCOUNTER — Encounter: Payer: Self-pay | Admitting: Internal Medicine

## 2018-11-02 ENCOUNTER — Telehealth: Payer: Self-pay | Admitting: Hematology and Oncology

## 2018-11-02 NOTE — Assessment & Plan Note (Signed)
Right lumpectomy 10/16/2068: IDC grade 2, 1.2 cm, intermediate grade DCIS, margins negative although less than 0.1 cm to medial margin and superior margin, 0/1 lymph node negative, ER 100%, PR 90%, HER-2 negative ratio 1.37, T1 cN0 stage IA Oncotype DX score 15: Risk of recurrence 9% Adjuvant radiation: 11/27/2015-01/15/2016  Current treatment: Anastrozole 1 mg daily started 02/08/2016  Anastrozole toxicities: Denies any hot flashes or myalgias  Breast cancer surveillance:  1.  Breast exam 11/07/2017: Benign 2. Mammogram 01/02/2018: Breast density category B postsurgical and postradiation changes and large postoperative seroma  Return to clinic in 1 year for follow-up

## 2018-11-02 NOTE — Telephone Encounter (Signed)
I talk with patient regarding video visit °

## 2018-11-05 ENCOUNTER — Telehealth: Payer: Self-pay | Admitting: Hematology and Oncology

## 2018-11-05 NOTE — Progress Notes (Signed)
HEMATOLOGY-ONCOLOGY MYCHART VIDEO VISIT PROGRESS NOTE  I connected with Denise [name] on 11/06/2018 at  8:15 AM EDT by MyChart video conference and verified that I am speaking with the correct person using two identifiers.  I discussed the limitations, risks, security and privacy concerns of performing an evaluation and management service by MyChart and the availability of in person appointments.  I also discussed with the patient that there may be a patient responsible charge related to this service. The patient expressed understanding and agreed to proceed.  Patient's Location: Home Physician Location: Clinic  CHIEF COMPLIANT: Follow-up on anastrozole therapy  INTERVAL HISTORY: Denise [name] is a 73 y.o. female with above-mentioned history of  right breast cancer treated with lumpectomy, radiation, and who is currently on anastrozole therapy. I last saw her a year ago. Mammogram on 01/02/18 showed no evidence of malignancy bilaterally. She presents over MyChart today for annual follow-up.  She has occ muscle spasms Denies hot flashes   Oncology History  Breast cancer of upper-outer quadrant of right female breast ([place])  09/18/2015 Initial Diagnosis   Screening right breast mass 9:00: 1 x 0.9 x 0.7 cm: Grade 2 IDC plus DCIS ER 100%, PR 90%, HER-2 negative ratio 1.38, Ki-67 10%; UOQ lesion 3 mm calcification plus distortion: Bx rec; T1 BN 0 stage IA clinical stage   10/17/2015 Surgery   Right lumpectomy ([facility]): IDC grade 2, 1.2 cm, intermediate grade DCIS, margins neg although < 0.1 cm to medial and superior margins, 0/1 LN negative, ER 100%, PR 90%, HER-2 negative ratio 1.37, T1 cN0 stage IA, Oncotype DX 15, 9% ROR, low risk   11/27/2015 - 01/15/2016 Radiation Therapy   Adjuvant radiation therapy [name]). Right breast: 50.4 Gy in 28 fractions. Right breast boost: 14 Gy in 7 fractions.    02/08/2016 -  Anti-estrogen oral therapy   Anastrozole 1 mg daily. Planned duration of  therapy: 5 years.      REVIEW OF SYSTEMS:   Constitutional: Denies fevers, chills or abnormal weight loss Eyes: Denies blurriness of vision Ears, nose, mouth, throat, and face: Denies mucositis or sore throat Respiratory: Denies cough, dyspnea or wheezes Cardiovascular: Denies palpitation, chest discomfort Gastrointestinal:  Denies nausea, heartburn or change in bowel habits Skin: Denies abnormal skin rashes Lymphatics: Denies new lymphadenopathy or easy bruising Neurological:Denies numbness, tingling or new weaknesses Behavioral/Psych: Mood is stable, no new changes  Extremities: No lower extremity edema Breast: denies any pain or lumps or nodules in either breasts All other systems were reviewed with the patient and are negative.  Observations/Objective:  There were no vitals filed for this visit. There is no height or weight on file to calculate BMI.  I have reviewed the data as listed CMP Latest Ref Rng & Units 10/20/2018 09/16/2018 04/15/2018  Glucose 70 - 99 mg/dL 121(H) 92 141(H)  BUN 6 - 23 mg/dL 9 12 13   Creatinine 0.40 - 1.20 mg/dL 0.68 0.68 0.78  Sodium 135 - 145 mEq/L 141 144 143  Potassium 3.5 - 5.1 mEq/L 3.7 4.3 3.9  Chloride 96 - 112 mEq/L 104 105 107  CO2 19 - 32 mEq/L 30 24 30   Calcium 8.4 - 10.5 mg/dL 9.8 9.7 10.0  Total Protein 6.0 - 8.3 g/dL 7.6 - 7.6  Total Bilirubin 0.2 - 1.2 mg/dL 0.8 - 0.6  Alkaline Phos 39 - 117 U/L 69 - 59  AST 0 - 37 U/L 18 - 18  ALT 0 - 35 U/L 18 - 21    Lab Results  Component Value Date   WBC 10.1 10/20/2018   HGB 13.8 10/20/2018   HCT 42.2 10/20/2018   MCV 81.9 10/20/2018   PLT 201.0 10/20/2018   NEUTROABS 6.4 10/20/2018      Assessment Plan:  Breast cancer of upper-outer quadrant of right female breast ([place]) Right lumpectomy 10/16/2068: IDC grade 2, 1.2 cm, intermediate grade DCIS, margins negative although less than 0.1 cm to medial margin and superior margin, 0/1 lymph node negative, ER 100%, PR 90%, HER-2 negative  ratio 1.37, T1 cN0 stage IA Oncotype DX score 15: Risk of recurrence 9% Adjuvant radiation: 11/27/2015-01/15/2016  Current treatment: Anastrozole 1 mg daily started 02/08/2016  Anastrozole toxicities: Denies any hot flashes or myalgias Renewed her prescription  Diabetes: on metformin  Breast cancer surveillance:  1.  Breast exam 11/07/2017: Benign 2. Mammogram 01/02/2018: Breast density category B postsurgical and postradiation changes and large postoperative seroma  Shes a Therapist, 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  I discussed the assessment and treatment plan with the patient. The patient was provided an opportunity to ask questions and all were answered. The patient agreed with the plan and demonstrated an understanding of the instructions. The patient was advised to call back or seek an in-person evaluation if the symptoms worsen or if the condition fails to improve as anticipated.   I provided 15 minutes of face-to-face MyChart video visit time during this encounter.    Rulon Eisenmenger, MD 11/06/2018   I, Molly Dorshimer, am acting as scribe for Nicholas Lose, MD.  I have reviewed the above documentation for accuracy and completeness, and I agree with the above.

## 2018-11-05 NOTE — Telephone Encounter (Signed)
Left VM to confirm appt/verify info °

## 2018-11-06 ENCOUNTER — Inpatient Hospital Stay: Payer: Medicare Other | Attending: Hematology and Oncology | Admitting: Hematology and Oncology

## 2018-11-06 DIAGNOSIS — C50411 Malignant neoplasm of upper-outer quadrant of right female breast: Secondary | ICD-10-CM

## 2018-11-06 DIAGNOSIS — Z79811 Long term (current) use of aromatase inhibitors: Secondary | ICD-10-CM | POA: Diagnosis not present

## 2018-11-06 DIAGNOSIS — Z923 Personal history of irradiation: Secondary | ICD-10-CM | POA: Diagnosis not present

## 2018-11-06 DIAGNOSIS — Z170 Estrogen receptor positive status [ER+]: Secondary | ICD-10-CM | POA: Diagnosis not present

## 2018-11-06 MED ORDER — ANASTROZOLE 1 MG PO TABS: 1.0000 mg | ORAL_TABLET | Freq: Every day | ORAL | 3 refills | Status: DC

## 2018-11-09 ENCOUNTER — Other Ambulatory Visit: Payer: Self-pay

## 2018-11-09 ENCOUNTER — Ambulatory Visit (INDEPENDENT_AMBULATORY_CARE_PROVIDER_SITE_OTHER): Payer: Medicare Other

## 2018-11-09 VITALS — BP 132/74 | HR 82 | Ht 61.0 in

## 2018-11-09 DIAGNOSIS — I10 Essential (primary) hypertension: Secondary | ICD-10-CM | POA: Diagnosis not present

## 2018-11-09 NOTE — Progress Notes (Signed)
Pt is here for BP check as requested by Kerin Ransom, PA-C. She states that she took her BP medication at 9am. She states that her BP at [facility] office last week was 140/88 Her Bp per her arm cuff(L) placed jus below antecubital area d/t cuff size 163/82 HR 74 Per her wrist cuff (L) 158/98 HR 70

## 2018-11-16 ENCOUNTER — Other Ambulatory Visit: Payer: Self-pay | Admitting: Internal Medicine

## 2018-11-16 ENCOUNTER — Other Ambulatory Visit: Payer: Self-pay | Admitting: Cardiovascular Disease

## 2018-11-17 DIAGNOSIS — C50911 Malignant neoplasm of unspecified site of right female breast: Secondary | ICD-10-CM | POA: Diagnosis not present

## 2018-12-01 ENCOUNTER — Other Ambulatory Visit: Payer: Self-pay | Admitting: General Surgery

## 2018-12-01 DIAGNOSIS — L905 Scar conditions and fibrosis of skin: Secondary | ICD-10-CM

## 2018-12-01 DIAGNOSIS — Z9889 Other specified postprocedural states: Secondary | ICD-10-CM

## 2019-01-01 ENCOUNTER — Other Ambulatory Visit: Payer: Self-pay | Admitting: Internal Medicine

## 2019-01-05 ENCOUNTER — Other Ambulatory Visit: Payer: Self-pay

## 2019-01-05 ENCOUNTER — Ambulatory Visit
Admission: RE | Admit: 2019-01-05 | Discharge: 2019-01-05 | Disposition: A | Discharge status: Home / Self Care | Payer: Medicare Other | Source: Ambulatory Visit | Attending: General Surgery | Admitting: General Surgery

## 2019-01-05 DIAGNOSIS — R928 Other abnormal and inconclusive findings on diagnostic imaging of breast: Secondary | ICD-10-CM | POA: Diagnosis not present

## 2019-01-05 DIAGNOSIS — N6311 Unspecified lump in the right breast, upper outer quadrant: Secondary | ICD-10-CM | POA: Diagnosis not present

## 2019-01-05 DIAGNOSIS — L905 Scar conditions and fibrosis of skin: Secondary | ICD-10-CM

## 2019-01-05 DIAGNOSIS — Z9889 Other specified postprocedural states: Secondary | ICD-10-CM

## 2019-01-14 ENCOUNTER — Other Ambulatory Visit: Payer: Self-pay | Admitting: Internal Medicine

## 2019-02-22 ENCOUNTER — Other Ambulatory Visit: Payer: Self-pay

## 2019-02-22 ENCOUNTER — Encounter: Payer: Self-pay | Admitting: Cardiovascular Disease

## 2019-02-22 ENCOUNTER — Ambulatory Visit (INDEPENDENT_AMBULATORY_CARE_PROVIDER_SITE_OTHER): Payer: Medicare Other | Admitting: Cardiovascular Disease

## 2019-02-22 VITALS — BP 144/72 | HR 73 | Ht 62.0 in | Wt 223.6 lb

## 2019-02-22 DIAGNOSIS — Z952 Presence of prosthetic heart valve: Secondary | ICD-10-CM

## 2019-02-22 DIAGNOSIS — E7800 Pure hypercholesterolemia, unspecified: Secondary | ICD-10-CM

## 2019-02-22 DIAGNOSIS — I10 Essential (primary) hypertension: Secondary | ICD-10-CM | POA: Diagnosis not present

## 2019-02-22 DIAGNOSIS — I6523 Occlusion and stenosis of bilateral carotid arteries: Secondary | ICD-10-CM | POA: Diagnosis not present

## 2019-02-22 MED ORDER — DOXAZOSIN MESYLATE 2 MG PO TABS: 2.0000 mg | ORAL_TABLET | Freq: Every day | ORAL | 3 refills | Status: DC
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
## 2019-02-22 NOTE — Patient Instructions (Signed)
Medication Instructions:  START DOXAZOSIN 2 MG DAILY   *If you need a refill on your cardiac medications before your next appointment, please call your pharmacy*  Lab Work: NONE   Testing/Procedures: NONE  Follow-Up: At [facility], you and your health needs are our priority.  As part of our continuing mission to provide you with exceptional heart care, we have created designated Provider Care Teams.  These Care Teams include your primary Cardiologist (physician) and Advanced Practice Providers (APPs -  Physician Assistants and Nurse Practitioners) who all work together to provide you with the care you need, when you need it.  Your next appointment:   Your physician recommends that you schedule a follow-up appointment in: [place]  6 months   You will receive a reminder letter in the mail two months in advance. If you don't receive a letter, please call our office to schedule the follow-up appointment.   The format for your next appointment:   Either In Person or Virtual  Provider:   You may see Skeet Latch, MD or one of the following Advanced Practice Providers on your designated Care Team:    Kerin Ransom, PA-C  [place], [place]  Coletta Memos, [place]   Other Instructions MONITOR AND LOG YOUR BLOOD PRESSURE. BRING READINGS AND BLOOD PRESSURE MACHINE TO FOLLOW UP

## 2019-02-26 DIAGNOSIS — Z961 Presence of intraocular lens: Secondary | ICD-10-CM | POA: Diagnosis not present

## 2019-02-26 DIAGNOSIS — E119 Type 2 diabetes mellitus without complications: Secondary | ICD-10-CM | POA: Diagnosis not present

## 2019-02-26 LAB — HM DIABETES EYE EXAM

## 2019-03-01 ENCOUNTER — Other Ambulatory Visit: Payer: Self-pay

## 2019-03-01 ENCOUNTER — Emergency Department (HOSPITAL_COMMUNITY)
Admission: EM | Admit: 2019-03-01 | Discharge: 2019-03-01 | Disposition: A | Discharge status: Home / Self Care | Payer: Medicare Other | Attending: Emergency Medicine | Admitting: Emergency Medicine

## 2019-03-01 ENCOUNTER — Emergency Department (HOSPITAL_COMMUNITY): Payer: Medicare Other

## 2019-03-01 ENCOUNTER — Encounter (HOSPITAL_COMMUNITY): Payer: Self-pay | Admitting: *Deleted

## 2019-03-01 DIAGNOSIS — R0689 Other abnormalities of breathing: Secondary | ICD-10-CM | POA: Diagnosis not present

## 2019-03-01 DIAGNOSIS — R1110 Vomiting, unspecified: Secondary | ICD-10-CM | POA: Diagnosis present

## 2019-03-01 DIAGNOSIS — R42 Dizziness and giddiness: Secondary | ICD-10-CM | POA: Diagnosis not present

## 2019-03-01 DIAGNOSIS — Z79899 Other long term (current) drug therapy: Secondary | ICD-10-CM | POA: Diagnosis not present

## 2019-03-01 DIAGNOSIS — C50411 Malignant neoplasm of upper-outer quadrant of right female breast: Secondary | ICD-10-CM | POA: Insufficient documentation

## 2019-03-01 DIAGNOSIS — J45909 Unspecified asthma, uncomplicated: Secondary | ICD-10-CM | POA: Diagnosis not present

## 2019-03-01 DIAGNOSIS — I10 Essential (primary) hypertension: Secondary | ICD-10-CM | POA: Insufficient documentation

## 2019-03-01 DIAGNOSIS — Z7984 Long term (current) use of oral hypoglycemic drugs: Secondary | ICD-10-CM | POA: Diagnosis not present

## 2019-03-01 DIAGNOSIS — Z87891 Personal history of nicotine dependence: Secondary | ICD-10-CM | POA: Insufficient documentation

## 2019-03-01 DIAGNOSIS — Z7982 Long term (current) use of aspirin: Secondary | ICD-10-CM | POA: Insufficient documentation

## 2019-03-01 DIAGNOSIS — E119 Type 2 diabetes mellitus without complications: Secondary | ICD-10-CM | POA: Diagnosis not present

## 2019-03-01 DIAGNOSIS — Z96653 Presence of artificial knee joint, bilateral: Secondary | ICD-10-CM | POA: Diagnosis not present

## 2019-03-01 DIAGNOSIS — R001 Bradycardia, unspecified: Secondary | ICD-10-CM | POA: Diagnosis not present

## 2019-03-01 DIAGNOSIS — R0902 Hypoxemia: Secondary | ICD-10-CM | POA: Diagnosis not present

## 2019-03-01 DIAGNOSIS — R4020 Unspecified coma: Secondary | ICD-10-CM | POA: Diagnosis not present

## 2019-03-01 LAB — COMPREHENSIVE METABOLIC PANEL
ALT: 24 U/L (ref 0–44)
AST: 25 U/L (ref 15–41)
Albumin: 4.2 g/dL (ref 3.5–5.0)
Alkaline Phosphatase: 46 U/L (ref 38–126)
Anion gap: 6 (ref 5–15)
BUN: 14 mg/dL (ref 8–23)
CO2: 24 mmol/L (ref 22–32)
Calcium: 9.1 mg/dL (ref 8.9–10.3)
Chloride: 113 mmol/L — ABNORMAL HIGH (ref 98–111)
Creatinine, Ser: 0.6 mg/dL (ref 0.44–1.00)
GFR calc Af Amer: >60 mL/min (ref >60)
GFR calc non Af Amer: >60 mL/min (ref >60)
Glucose, Bld: 160 mg/dL — ABNORMAL HIGH (ref 70–99)
Potassium: 3.1 mmol/L — ABNORMAL LOW (ref 3.5–5.1)
Sodium: 143 mmol/L (ref 135–145)
Total Bilirubin: 0.8 mg/dL (ref 0.3–1.2)
Total Protein: 7 g/dL (ref 6.5–8.1)

## 2019-03-01 LAB — CBC WITH DIFFERENTIAL/PLATELET
Abs Immature Granulocytes: 0.06 10*3/uL (ref 0.00–0.07)
Basophils Absolute: 0.1 10*3/uL (ref 0.0–0.1)
Basophils Relative: 1 %
Eosinophils Absolute: 0.2 10*3/uL (ref 0.0–0.5)
Eosinophils Relative: 1 %
HCT: 39.7 % (ref 36.0–46.0)
Hemoglobin: 12.3 g/dL (ref 12.0–15.0)
Immature Granulocytes: 1 %
Lymphocytes Relative: 18 %
Lymphs Abs: 2.3 10*3/uL (ref 0.7–4.0)
MCH: 26.6 pg (ref 26.0–34.0)
MCHC: 31 g/dL (ref 30.0–36.0)
MCV: 85.9 fL (ref 80.0–100.0)
Monocytes Absolute: 0.8 10*3/uL (ref 0.1–1.0)
Monocytes Relative: 6 %
Neutro Abs: 9.5 10*3/uL — ABNORMAL HIGH (ref 1.7–7.7)
Neutrophils Relative %: 73 %
Platelets: 186 10*3/uL (ref 150–400)
RBC: 4.62 MIL/uL (ref 3.87–5.11)
RDW: 15.5 % (ref 11.5–15.5)
WBC: 13 10*3/uL — ABNORMAL HIGH (ref 4.0–10.5)
nRBC: 0 % (ref 0.0–0.2)

## 2019-03-01 LAB — MAGNESIUM: Magnesium: 1.7 mg/dL (ref 1.7–2.4)

## 2019-03-01 MED ORDER — MECLIZINE HCL 12.5 MG PO TABS
25.0000 mg | ORAL_TABLET | Freq: Once | ORAL | Status: AC
  Administered 2019-03-01: 25 mg via ORAL
  Filled 2019-03-01: qty 2

## 2019-03-01 MED ORDER — MECLIZINE HCL 25 MG PO TABS: 25.0000 mg | ORAL_TABLET | Freq: Three times a day (TID) | ORAL | 0 refills | Status: DC | PRN

## 2019-03-01 MED ORDER — ONDANSETRON HCL 4 MG/2ML IJ SOLN
4.0000 mg | Freq: Once | INTRAMUSCULAR | Status: AC
  Administered 2019-03-01: 4 mg via INTRAVENOUS

## 2019-03-01 MED ORDER — ONDANSETRON HCL 4 MG/2ML IJ SOLN
4.0000 mg | Freq: Once | INTRAMUSCULAR | Status: DC
  Filled 2019-03-01: qty 2

## 2019-03-01 MED ORDER — ONDANSETRON 4 MG PO TBDP: 4.0000 mg | ORAL_TABLET | Freq: Three times a day (TID) | ORAL | 0 refills | Status: DC | PRN

## 2019-03-01 MED ORDER — SODIUM CHLORIDE 0.9 % IV SOLN
INTRAVENOUS | Status: DC
  Administered 2019-03-01: 16:00:00 via INTRAVENOUS

## 2019-03-01 NOTE — ED Triage Notes (Addendum)
Pt brought in by [facility] with c/o sudden onset of n/v, fecal incontinence and dizziness. Denies abdominal pain. Pt given Zofran 4mg  IV by EMS.
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
## 2019-03-01 NOTE — ED Notes (Signed)
RN went in to give Meclizine PO. Pt reports she is too nauseated right now to take it. Zofran 4mg  IV was given a few minutes prior. Will continue to monitor pt and see how Zofran works and try administering again soon.

## 2019-03-01 NOTE — Discharge Instructions (Signed)
The CT scan showed a small calcification, this is not causing your symptoms, it is likely a chronic problem but you will need to follow-up with your doctor about these results  You may take Zofran 1 tablet every 6 hours to 8 hours as needed for nausea Drink plenty of fluids and get plenty of rest laying in bed Your symptoms may worsen if you try to get up and move around.  This is expected. Overall the symptoms may last for a couple of days. You may find some relief by taking meclizine, 1 tablet every 8 hours as needed for the dizziness  Seek medical exam in the emergency department for increasing dizziness that does not go away or any numbness weakness changes in vision or speech.

## 2019-03-02 ENCOUNTER — Telehealth: Payer: Self-pay | Admitting: Cardiovascular Disease

## 2019-03-02 NOTE — Telephone Encounter (Signed)
Called patient- she states that she has been dizzy, which caused her to call EMS- they took her to ER, as she was dizzy and nauseated. She states her BP and HR have been okay- but the only new medication she or the doctors could see was the doxazosin she states that she has not taken since yesterday, and she does feel a little better/ but she is on meclizine given to her to help the dizziness. Patient states she was told to let Dr.Las Palomas know of this and to look over her ER visit as she thinks it has to do with the new medication. Advised patient I would send a message to MD to be aware, and call back with any recommendations.  BP and HR as of this morning- 142/83 HR 67.

## 2019-03-02 NOTE — Telephone Encounter (Signed)
Pt c/o medication issue:  1. Name of Medication: Doxazosin  2. How are you currently taking this medication (dosage and times per day)? 1 time a day  3. Are you having a reaction (difficulty breathing--STAT)? no  4. What is your medication issue? Dizziness and faintnesst, room spinning and vomiting- had to go to ER- they told her it was  Severe Vertigo and told her to contact her Cardiologist this morning

## 2019-03-04 ENCOUNTER — Other Ambulatory Visit: Payer: Self-pay

## 2019-03-04 ENCOUNTER — Ambulatory Visit (INDEPENDENT_AMBULATORY_CARE_PROVIDER_SITE_OTHER): Payer: Medicare Other | Admitting: Internal Medicine

## 2019-03-04 ENCOUNTER — Encounter: Payer: Self-pay | Admitting: Internal Medicine

## 2019-03-04 VITALS — BP 152/98 | HR 81 | Temp 98.2°F | Ht 62.0 in | Wt 226.0 lb

## 2019-03-04 DIAGNOSIS — Z23 Encounter for immunization: Secondary | ICD-10-CM

## 2019-03-04 DIAGNOSIS — E119 Type 2 diabetes mellitus without complications: Secondary | ICD-10-CM

## 2019-03-04 DIAGNOSIS — E7800 Pure hypercholesterolemia, unspecified: Secondary | ICD-10-CM | POA: Diagnosis not present

## 2019-03-04 DIAGNOSIS — I10 Essential (primary) hypertension: Secondary | ICD-10-CM | POA: Diagnosis not present

## 2019-03-04 DIAGNOSIS — I6523 Occlusion and stenosis of bilateral carotid arteries: Secondary | ICD-10-CM | POA: Diagnosis not present

## 2019-03-04 NOTE — Telephone Encounter (Signed)
Yes this is OK.  We will monitor her renal function with the BMP.

## 2019-03-04 NOTE — Assessment & Plan Note (Signed)
stable overall by history and exam, recent data reviewed with pt, and pt to continue medical treatment as before,  to f/u any worsening symptoms or concerns  

## 2019-03-04 NOTE — Telephone Encounter (Signed)
Advised patient but she looked up side effects. She would like to think about it over the weekend and call back Monday with her decision.

## 2019-03-04 NOTE — Telephone Encounter (Signed)
Patient is concerned about taking the Spironolactone along with the Furosemide 40 mg BID and would like to verify with Dr Oval Linsey she wants both.  Blood pressure 140/85 at 7:00 am today   Will forward to Dr Oval Linsey for review
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
## 2019-03-04 NOTE — Patient Instructions (Addendum)
You had the flu shot today  Please continue all other medications as before, including your allergy medications  Please have the pharmacy call with any other refills you may need.  Please continue your efforts at being more active, low cholesterol diet, and weight control.  Please keep your appointments with your specialists as you may have planned

## 2019-03-04 NOTE — Telephone Encounter (Signed)
I'm so sorry this medicine didn't work for her and that she is feeling so poorly.  Definitely stop it.  I would try spironolactone 25mg  daily.  This should also help to keep her potassium up.  Check BMP in 1 week.

## 2019-03-11 DIAGNOSIS — E348 Other specified endocrine disorders: Secondary | ICD-10-CM | POA: Diagnosis not present

## 2019-03-11 DIAGNOSIS — M542 Cervicalgia: Secondary | ICD-10-CM | POA: Diagnosis not present

## 2019-03-11 DIAGNOSIS — M5137 Other intervertebral disc degeneration, lumbosacral region: Secondary | ICD-10-CM | POA: Diagnosis not present

## 2019-03-18 ENCOUNTER — Other Ambulatory Visit: Payer: Self-pay | Admitting: Neurosurgery

## 2019-03-18 DIAGNOSIS — E348 Other specified endocrine disorders: Secondary | ICD-10-CM

## 2019-04-02 ENCOUNTER — Other Ambulatory Visit: Payer: Self-pay | Admitting: Internal Medicine

## 2019-04-05 ENCOUNTER — Other Ambulatory Visit: Payer: Self-pay

## 2019-04-05 ENCOUNTER — Ambulatory Visit (INDEPENDENT_AMBULATORY_CARE_PROVIDER_SITE_OTHER): Payer: Medicare Other | Admitting: Pharmacist

## 2019-04-05 VITALS — BP 126/72 | HR 93 | Resp 15 | Ht 61.0 in | Wt 223.0 lb

## 2019-04-05 DIAGNOSIS — I10 Essential (primary) hypertension: Secondary | ICD-10-CM

## 2019-04-05 DIAGNOSIS — I6523 Occlusion and stenosis of bilateral carotid arteries: Secondary | ICD-10-CM | POA: Diagnosis not present

## 2019-04-05 NOTE — Patient Instructions (Addendum)
Return for a  follow up appointment AS NEEDED  Check your blood pressure at home daily (if able) and keep record of the readings.  Take your BP meds as follows: *NO MEDICATION CHANGES*  Bring all of your meds, your BP cuff and your record of home blood pressures to your next appointment.  Exercise as you're able, try to walk approximately 30 minutes per day.  Keep salt intake to a minimum, especially watch canned and prepared boxed foods.  Eat more fresh fruits and vegetables and fewer canned items.  Avoid eating in fast food restaurants.    HOW TO TAKE YOUR BLOOD PRESSURE: . Rest 5 minutes before taking your blood pressure. .  Don't smoke or drink caffeinated beverages for at least 30 minutes before. . Take your blood pressure before (not after) you eat. . Sit comfortably with your back supported and both feet on the floor (don't cross your legs). . Elevate your arm to heart level on a table or a desk. . Use the proper sized cuff. It should fit smoothly and snugly around your bare upper arm. There should be enough room to slip a fingertip under the cuff. The bottom edge of the cuff should be 1 inch above the crease of the elbow. . Ideally, take 3 measurements at one sitting and record the average.    

## 2019-04-05 NOTE — Progress Notes (Signed)
Patient ID: Denise [name]                 DOB: 30-May-1945                      MRN: [id number]     HPI:  Denise [name] is a 73 y.o. female referred by Dr. Oval Linsey to HTN clinic. PMH includes hypertension, presyncopal episodes, severe aortic stenosis, s/p TAVR, asthma, GERD, DM-II, depression, spinal stenosis, breast cancer, and hyperlipidemia.  Patient developed severe dizziness and vertigo with doxazosin. Reports compliance with all medication and multiple lifestyle modifications since last OV with DR Rutledge.    Current HTN meds:  Amlodipine 10mg  daily in AM Furosemide 40mg  twice daily  Lisinopril 40mg  daily in AM  Previously tried:  Doxazosin - N/V and dizziness  BP goal: <140/80 due to hx of syncopal episodes   Family History: patient's family history includes Colon polyps in an other family member; Diabetes in an other family member; Heart attack in her mother; Hypertension in an other family member; Stroke in an other family member.   Social History: The patient  reports that she has quit smoking. She has never used smokeless tobacco. She reports that she does not drink alcohol or use drugs.   Diet: lowering portions, less sodium, mainly home cooked meals  Exercise: 30 minutes stationary bike (MWF)  Home BP readings: home arm cuff ([facility] [employer]) - 122/78 (77) No records provided today but home BP cuff determined to accurate with less than 78mmHg difference from manual readings.   Wt Readings from Last 3 Encounters:  04/05/19 223 lb (101.2 kg)  03/04/19 226 lb (102.5 kg)  03/01/19 223 lb 9.6 oz (101.4 kg)   BP Readings from Last 3 Encounters:  04/05/19 126/72  03/04/19 (!) 152/98  03/01/19 138/72   Pulse Readings from Last 3 Encounters:  04/05/19 93  03/04/19 81  03/01/19 77    Past Medical History:  Diagnosis Date  . Abdominal pain, left lower quadrant 09/12/2008  . ALLERGIC RHINITIS 08/24/2007  . Allergy   . Anemia    past hx- not current   .  ANXIETY 08/24/2007  . Aortic stenosis, severe 07/19/2016  . ASTHMA 08/24/2007  . ASTHMA, WITH ACUTE EXACERBATION 03/14/2008  . Breast cancer ([place])    right- radiation only   . Cataract    removed bilat   . DDD (degenerative disc disease), lumbar   . DEGENERATIVE JOINT DISEASE 08/24/2007  . DEPRESSION 08/24/2007  . DIABETES MELLITUS, TYPE II 08/24/2007  . ECZEMA 08/24/2007  . Edema 08/24/2007  . GERD 08/24/2007   not current (07/2014)- not current 2019  . Heart murmur   . HYPERCHOLESTEROLEMIA 08/24/2007  . HYPERLIPIDEMIA 08/24/2007  . HYPERTENSION 08/24/2007  . Neuromuscular disorder (HCC)    numbness in feet - still has feeling  . OBESITY 08/24/2007  . 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 Personal history of radiation therapy 2017  . POSTMENOPAUSAL STATUS 08/24/2007  . 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 SPINAL STENOSIS 08/24/2007    Current Outpatient Medications on File Prior to Visit  Medication Sig Dispense Refill  . albuterol (PROVENTIL HFA;VENTOLIN HFA) 108 (90 Base) MCG/ACT inhaler Inhale 2 puffs into the lungs every 6 (six) hours as needed for wheezing or shortness of breath. 1 Inhaler 2  . amLODipine (NORVASC) 10  MG tablet Take 1 tablet by mouth once daily 90 tablet 0  . amoxicillin (AMOXIL) 500 MG capsule Take 2,000 mg by mouth as directed.     Marland Kitchen anastrozole (ARIMIDEX) 1 MG tablet Take 1 tablet (1 mg total) by mouth daily. 90 tablet 3  . aspirin 81 MG EC tablet Take 81 mg by mouth daily.      Marland Kitchen atorvastatin (LIPITOR) 40 MG tablet Take 1 tablet by mouth once daily (Patient taking differently: Take 40 mg by mouth every morning. ) 90 tablet 1  . budesonide-formoterol (SYMBICORT) 160-4.5 MCG/ACT inhaler Inhale 2 puffs by mouth twice daily (Patient taking differently: Inhale 2 puffs into the lungs 2 (two) times  daily. ) 33 g 0  . Calcium-Magnesium-Vitamin D (CALCIUM 1200+D3 PO) Take 1 tablet by mouth daily.    . cetirizine (EQ ALLERGY RELIEF, CETIRIZINE,) 10 MG tablet TAKE 1 TABLET BY MOUTH ONCE DAILY AS NEEDED FOR  ALLERGIES (Patient taking differently: Take 10 mg by mouth daily as needed for allergies. ) 90 tablet 3  . Cholecalciferol (VITAMIN D-3) 1000 units CAPS Take 1,000 Units by mouth daily.    . fluticasone (FLONASE) 50 MCG/ACT nasal spray USE 2 SPRAYS INTO BOTH NOSTRILS DAILY (Patient taking differently: Place 2 sprays into both nostrils daily. ) 16 g 5  . furosemide (LASIX) 40 MG tablet Take 1 tablet (40 mg total) by mouth 2 (two) times daily. 180 tablet 3  . lisinopril (ZESTRIL) 40 MG tablet Take 1 tablet by mouth once daily (Patient taking differently: Take 40 mg by mouth daily. ) 90 tablet 2  . meclizine (ANTIVERT) 25 MG tablet Take 1 tablet (25 mg total) by mouth 3 (three) times daily as needed for dizziness. 30 tablet 0  . metFORMIN (GLUCOPHAGE) 500 MG tablet Take 2 tablets (1,000 mg total) by mouth 2 (two) times daily with a meal. 360 tablet 3  . Multiple Vitamins-Minerals (ALIVE WOMENS 50+) TABS Take 1 tablet by mouth daily.    . Omega-3 Fatty Acids (FISH OIL) 1000 MG CAPS Take 1,000 mg by mouth daily.    . ondansetron (ZOFRAN ODT) 4 MG disintegrating tablet Take 1 tablet (4 mg total) by mouth every 8 (eight) hours as needed for nausea. 10 tablet 0  . Polyethyl Glycol-Propyl Glycol (SYSTANE ULTRA OP) Apply 1 drop to eye daily as needed (dry eyes).    . potassium chloride (MICRO-K) 10 MEQ CR capsule TAKE 4 CAPSULES BY MOUTH ONCE DAILY 360 capsule 0   Current Facility-Administered Medications on File Prior to Visit  Medication Dose Route Frequency Provider Last Rate Last Dose  . albuterol (PROVENTIL) (2.5 MG/3ML) 0.083% nebulizer solution 2.5 mg  2.5 mg Nebulization Once Marrian Salvage, FNP        Allergies  Allergen Reactions  . Doxazosin Nausea And Vomiting    Dizziness   .  Clonidine Hydrochloride Other (See Comments)    Bradycardia  . Erythromycin Palpitations  . Hydrocodone-Acetaminophen Nausea Only  . Pork-Derived Products Diarrhea and Nausea Only  . Rosiglitazone Maleate Swelling    SWELLING REACTION UNSPECIFIED     Blood pressure 126/72, pulse 93, resp. rate 15, height 5\' 1"  (1.549 m), weight 223 lb (101.2 kg), SpO2 93 %.  Essential hypertension Blood pressure well controlled during OV and home BP cuff determined to be accurate. Patient was unable to tolerate doxazosin therapy bur reports compliance with all other medication. She increase physical activity and is cutting portions in most meals and well as decreasing sodium in  her diet. Will continue current medication without changes and follow [place] as needed with HTN clinic.   Nikia Mangino Rodriguez-Guzman PharmD, BCPS, [facility] [address] [place] [address] 04/06/2019 1:35 PM

## 2019-04-06 ENCOUNTER — Encounter: Payer: Self-pay | Admitting: Pharmacist

## 2019-04-06 NOTE — Assessment & Plan Note (Signed)
Blood pressure well controlled during OV and home BP cuff determined to be accurate. Patient was unable to tolerate doxazosin therapy bur reports compliance with all other medication. She increase physical activity and is cutting portions in most meals and well as decreasing sodium in her diet. Will continue current medication without changes and follow [place] as needed with HTN clinic.

## 2019-04-07 ENCOUNTER — Ambulatory Visit
Admission: RE | Admit: 2019-04-07 | Discharge: 2019-04-07 | Disposition: A | Discharge status: Home / Self Care | Payer: Medicare Other | Source: Ambulatory Visit | Attending: Neurosurgery | Admitting: Neurosurgery

## 2019-04-07 ENCOUNTER — Other Ambulatory Visit: Payer: Self-pay

## 2019-04-07 DIAGNOSIS — I6782 Cerebral ischemia: Secondary | ICD-10-CM | POA: Diagnosis not present

## 2019-04-07 DIAGNOSIS — E348 Other specified endocrine disorders: Secondary | ICD-10-CM | POA: Diagnosis not present

## 2019-04-07 MED ORDER — GADOBENATE DIMEGLUMINE 529 MG/ML IV SOLN
20.0000 mL | Freq: Once | INTRAVENOUS | Status: AC | PRN
  Administered 2019-04-07: 20 mL via INTRAVENOUS

## 2019-04-08 DIAGNOSIS — E348 Other specified endocrine disorders: Secondary | ICD-10-CM | POA: Diagnosis not present

## 2019-04-14 ENCOUNTER — Encounter: Payer: Self-pay | Admitting: Internal Medicine

## 2019-04-14 ENCOUNTER — Other Ambulatory Visit: Payer: Self-pay

## 2019-04-14 ENCOUNTER — Ambulatory Visit (INDEPENDENT_AMBULATORY_CARE_PROVIDER_SITE_OTHER): Payer: Medicare Other | Admitting: Internal Medicine

## 2019-04-14 VITALS — BP 138/84 | HR 85 | Temp 98.4°F | Ht 61.0 in | Wt 221.0 lb

## 2019-04-14 DIAGNOSIS — E7800 Pure hypercholesterolemia, unspecified: Secondary | ICD-10-CM

## 2019-04-14 DIAGNOSIS — E119 Type 2 diabetes mellitus without complications: Secondary | ICD-10-CM

## 2019-04-14 DIAGNOSIS — I10 Essential (primary) hypertension: Secondary | ICD-10-CM

## 2019-04-14 DIAGNOSIS — I6523 Occlusion and stenosis of bilateral carotid arteries: Secondary | ICD-10-CM | POA: Diagnosis not present

## 2019-04-14 LAB — POCT GLYCOSYLATED HEMOGLOBIN (HGB A1C): Hemoglobin A1C: 5.8 % — AB (ref 4.0–5.6)
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
## 2019-04-14 NOTE — Assessment & Plan Note (Signed)
stable overall by history and exam, recent data reviewed with pt, and pt to continue medical treatment as before,  to f/u any worsening symptoms or concerns, declines lab f/u today

## 2019-04-14 NOTE — Assessment & Plan Note (Signed)
stable overall by history and exam, recent data reviewed with pt, and pt to continue medical treatment as before,  to f/u any worsening symptoms or concerns  

## 2019-04-14 NOTE — Patient Instructions (Signed)
Your A1c was OK today  Please continue all other medications as before, and refills have been done if requested.  Please have the pharmacy call with any other refills you may need.  Please continue your efforts at being more active, low cholesterol diet, and weight control.  You are otherwise up to date with prevention measures today.  Please keep your appointments with your specialists as you may have planned  Please return in 6 months, or sooner if needed 

## 2019-05-29 ENCOUNTER — Other Ambulatory Visit: Payer: Self-pay | Admitting: Internal Medicine

## 2019-05-29 NOTE — Telephone Encounter (Signed)
Please refill as per office routine med refill policy (all routine meds refilled for 3 mo or monthly per pt preference up to one year from last visit, then month to month grace period for 3 mo, then further med refills will have to be denied)  

## 2019-05-30 DIAGNOSIS — Z20828 Contact with and (suspected) exposure to other viral communicable diseases: Secondary | ICD-10-CM | POA: Diagnosis not present

## 2019-05-30 DIAGNOSIS — Z03818 Encounter for observation for suspected exposure to other biological agents ruled out: Secondary | ICD-10-CM | POA: Diagnosis not present

## 2019-06-05 ENCOUNTER — Other Ambulatory Visit: Payer: Self-pay | Admitting: Internal Medicine

## 2019-06-05 NOTE — Telephone Encounter (Signed)
Please refill as per office routine med refill policy (all routine meds refilled for 3 mo or monthly per pt preference up to one year from last visit, then month to month grace period for 3 mo, then further med refills will have to be denied)  

## 2019-06-14 DIAGNOSIS — Z20828 Contact with and (suspected) exposure to other viral communicable diseases: Secondary | ICD-10-CM | POA: Diagnosis not present

## 2019-06-14 DIAGNOSIS — Z03818 Encounter for observation for suspected exposure to other biological agents ruled out: Secondary | ICD-10-CM | POA: Diagnosis not present

## 2019-06-19 ENCOUNTER — Other Ambulatory Visit: Payer: Self-pay | Admitting: Internal Medicine

## 2019-06-20 NOTE — Telephone Encounter (Signed)
Please refill as per office routine med refill policy (all routine meds refilled for 3 mo or monthly per pt preference up to one year from last visit, then month to month grace period for 3 mo, then further med refills will have to be denied)  

## 2019-06-23 ENCOUNTER — Encounter: Payer: Self-pay | Admitting: Internal Medicine

## 2019-06-25 DIAGNOSIS — Z23 Encounter for immunization: Secondary | ICD-10-CM | POA: Diagnosis not present

## 2019-07-06 DIAGNOSIS — E348 Other specified endocrine disorders: Secondary | ICD-10-CM | POA: Diagnosis not present

## 2019-07-06 DIAGNOSIS — M5440 Lumbago with sciatica, unspecified side: Secondary | ICD-10-CM | POA: Diagnosis not present

## 2019-07-08 ENCOUNTER — Other Ambulatory Visit: Payer: Self-pay | Admitting: Neurosurgery

## 2019-07-08 DIAGNOSIS — M5440 Lumbago with sciatica, unspecified side: Secondary | ICD-10-CM

## 2019-07-23 DIAGNOSIS — Z23 Encounter for immunization: Secondary | ICD-10-CM | POA: Diagnosis not present

## 2019-07-28 ENCOUNTER — Encounter: Payer: Self-pay | Admitting: Internal Medicine

## 2019-08-04 ENCOUNTER — Other Ambulatory Visit: Payer: Self-pay

## 2019-08-04 ENCOUNTER — Ambulatory Visit
Admission: RE | Admit: 2019-08-04 | Discharge: 2019-08-04 | Disposition: A | Discharge status: Home / Self Care | Payer: Medicare Other | Source: Ambulatory Visit | Attending: Neurosurgery | Admitting: Neurosurgery

## 2019-08-04 DIAGNOSIS — M48061 Spinal stenosis, lumbar region without neurogenic claudication: Secondary | ICD-10-CM | POA: Diagnosis not present

## 2019-08-04 DIAGNOSIS — M5440 Lumbago with sciatica, unspecified side: Secondary | ICD-10-CM

## 2019-08-04 MED ORDER — GADOBENATE DIMEGLUMINE 529 MG/ML IV SOLN
20.0000 mL | Freq: Once | INTRAVENOUS | Status: AC | PRN
  Administered 2019-08-04: 20 mL via INTRAVENOUS

## 2019-08-05 DIAGNOSIS — M5440 Lumbago with sciatica, unspecified side: Secondary | ICD-10-CM | POA: Diagnosis not present

## 2019-08-12 ENCOUNTER — Other Ambulatory Visit: Payer: Self-pay | Admitting: Cardiovascular Disease

## 2019-08-12 ENCOUNTER — Other Ambulatory Visit: Payer: Self-pay | Admitting: Internal Medicine

## 2019-08-27 ENCOUNTER — Ambulatory Visit (INDEPENDENT_AMBULATORY_CARE_PROVIDER_SITE_OTHER): Payer: Medicare Other | Admitting: Cardiovascular Disease

## 2019-08-27 ENCOUNTER — Other Ambulatory Visit: Payer: Self-pay

## 2019-08-27 ENCOUNTER — Encounter: Payer: Self-pay | Admitting: Cardiovascular Disease

## 2019-08-27 VITALS — BP 130/78 | HR 77 | Temp 98.1°F | Ht 61.0 in | Wt 220.0 lb

## 2019-08-27 DIAGNOSIS — E7800 Pure hypercholesterolemia, unspecified: Secondary | ICD-10-CM | POA: Diagnosis not present

## 2019-08-27 DIAGNOSIS — I350 Nonrheumatic aortic (valve) stenosis: Secondary | ICD-10-CM | POA: Diagnosis not present

## 2019-08-27 DIAGNOSIS — I10 Essential (primary) hypertension: Secondary | ICD-10-CM | POA: Diagnosis not present

## 2019-08-27 DIAGNOSIS — Z952 Presence of prosthetic heart valve: Secondary | ICD-10-CM | POA: Diagnosis not present

## 2019-08-27 NOTE — Patient Instructions (Signed)
Medication Instructions:  Your physician recommends that you continue on your current medications as directed. Please refer to the Current Medication list given to you today.  *If you need a refill on your cardiac medications before your next appointment, please call your pharmacy*   Lab Work: NONE  If you have labs (blood work) drawn today and your tests are completely normal, you will receive your results only by: Marland Kitchen MyChart Message (if you have MyChart) OR . A paper copy in the mail If you have any lab test that is abnormal or we need to change your treatment, we will call you to review the results.   Testing/Procedures: Your physician has requested that you have an echocardiogram. Echocardiography is a painless test that uses sound waves to create images of your heart. It provides your doctor with information about the size and shape of your heart and how well your heart's chambers and valves are working. This procedure takes approximately one hour. There are no restrictions for this procedure. [address]    Follow-Up: At [facility], you and your health needs are our priority.  As part of our continuing mission to provide you with exceptional heart care, we have created designated Provider Care Teams.  These Care Teams include your primary Cardiologist (physician) and Advanced Practice Providers (APPs -  Physician Assistants and Nurse Practitioners) who all work together to provide you with the care you need, when you need it.  We recommend signing up for the patient portal called "MyChart".  Sign up information is provided on this After Visit Summary.  MyChart is used to connect with patients for Virtual Visits (Telemedicine).  Patients are able to view lab/test results, encounter notes, upcoming appointments, etc.  Non-urgent messages can be sent to your provider as well.   To learn more about what you can do with MyChart, go to [email or web address].     Your next appointment:   3-4 MONTHS   The format for your next appointment:   In Person  Provider:   You may see Skeet Latch, MD or one of the following Advanced Practice Providers on your designated Care Team:    Kerin Ransom, PA-C  [place], [place]  Coletta Memos, FNP  Other Instructions INCREASE YOUR EXERCISE TO [address] BLOOD PRESSURE, BRING READINGS TO FOLLOW UP
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
## 2019-09-01 ENCOUNTER — Other Ambulatory Visit: Payer: Self-pay | Admitting: Internal Medicine

## 2019-09-01 NOTE — Telephone Encounter (Signed)
Please refill as per office routine med refill policy (all routine meds refilled for 3 mo or monthly per pt preference up to one year from last visit, then month to month grace period for 3 mo, then further med refills will have to be denied)  

## 2019-09-14 ENCOUNTER — Other Ambulatory Visit (HOSPITAL_COMMUNITY): Payer: Medicare Other

## 2019-09-16 ENCOUNTER — Other Ambulatory Visit: Payer: Self-pay | Admitting: Internal Medicine

## 2019-09-16 NOTE — Telephone Encounter (Signed)
Please refill as per office routine med refill policy (all routine meds refilled for 3 mo or monthly per pt preference up to one year from last visit, then month to month grace period for 3 mo, then further med refills will have to be denied)  

## 2019-09-17 ENCOUNTER — Ambulatory Visit (HOSPITAL_COMMUNITY): Payer: Medicare Other | Attending: Cardiology

## 2019-09-17 ENCOUNTER — Other Ambulatory Visit: Payer: Self-pay

## 2019-09-17 DIAGNOSIS — Z952 Presence of prosthetic heart valve: Secondary | ICD-10-CM | POA: Diagnosis not present

## 2019-10-02 ENCOUNTER — Encounter: Payer: Self-pay | Admitting: Internal Medicine

## 2019-10-04 ENCOUNTER — Other Ambulatory Visit: Payer: Self-pay

## 2019-10-04 MED ORDER — ALBUTEROL SULFATE HFA 108 (90 BASE) MCG/ACT IN AERS: 2.0000 | INHALATION_SPRAY | Freq: Four times a day (QID) | RESPIRATORY_TRACT | 1 refills | Status: DC | PRN

## 2019-10-04 NOTE — Telephone Encounter (Signed)
Pt rx refill for her Albuterol rescue inhaler has been sent.

## 2019-10-13 ENCOUNTER — Other Ambulatory Visit: Payer: Self-pay

## 2019-10-13 ENCOUNTER — Ambulatory Visit (INDEPENDENT_AMBULATORY_CARE_PROVIDER_SITE_OTHER): Payer: Medicare Other | Admitting: Internal Medicine

## 2019-10-13 ENCOUNTER — Encounter: Payer: Self-pay | Admitting: Internal Medicine

## 2019-10-13 VITALS — BP 136/86 | HR 66 | Temp 98.1°F | Ht 61.0 in | Wt 217.0 lb

## 2019-10-13 DIAGNOSIS — E119 Type 2 diabetes mellitus without complications: Secondary | ICD-10-CM | POA: Diagnosis not present

## 2019-10-13 DIAGNOSIS — F32A Depression, unspecified: Secondary | ICD-10-CM

## 2019-10-13 DIAGNOSIS — E7800 Pure hypercholesterolemia, unspecified: Secondary | ICD-10-CM

## 2019-10-13 DIAGNOSIS — I10 Essential (primary) hypertension: Secondary | ICD-10-CM | POA: Diagnosis not present

## 2019-10-13 DIAGNOSIS — F329 Major depressive disorder, single episode, unspecified: Secondary | ICD-10-CM

## 2019-10-13 DIAGNOSIS — E538 Deficiency of other specified B group vitamins: Secondary | ICD-10-CM

## 2019-10-13 DIAGNOSIS — E559 Vitamin D deficiency, unspecified: Secondary | ICD-10-CM

## 2019-10-13 LAB — HEMOGLOBIN A1C: Hgb A1c MFr Bld: 6.3 % (ref 4.6–6.5)

## 2019-10-13 LAB — CBC WITH DIFFERENTIAL/PLATELET
Basophils Absolute: 0.1 10*3/uL (ref 0.0–0.1)
Basophils Relative: 1.3 % (ref 0.0–3.0)
Eosinophils Absolute: 0.3 10*3/uL (ref 0.0–0.7)
Eosinophils Relative: 3.4 % (ref 0.0–5.0)
HCT: 40.7 % (ref 36.0–46.0)
Hemoglobin: 13.4 g/dL (ref 12.0–15.0)
Lymphocytes Relative: 28.2 % (ref 12.0–46.0)
Lymphs Abs: 2.1 10*3/uL (ref 0.7–4.0)
MCHC: 33 g/dL (ref 30.0–36.0)
MCV: 81.5 fl (ref 78.0–100.0)
Monocytes Absolute: 0.5 10*3/uL (ref 0.1–1.0)
Monocytes Relative: 6.9 % (ref 3.0–12.0)
Neutro Abs: 4.6 10*3/uL (ref 1.4–7.7)
Neutrophils Relative %: 60.2 % (ref 43.0–77.0)
Platelets: 183 10*3/uL (ref 150.0–400.0)
RBC: 5 Mil/uL (ref 3.87–5.11)
RDW: 15.7 % — ABNORMAL HIGH (ref 11.5–15.5)
WBC: 7.6 10*3/uL (ref 4.0–10.5)

## 2019-10-13 LAB — LIPID PANEL
Cholesterol: 169 mg/dL (ref 0–200)
HDL: 76.3 mg/dL (ref >39.00)
LDL Cholesterol: 81 mg/dL (ref 0–99)
NonHDL: 92.96
Total CHOL/HDL Ratio: 2
Triglycerides: 60 mg/dL (ref 0.0–149.0)
VLDL: 12 mg/dL (ref 0.0–40.0)

## 2019-10-13 LAB — HEPATIC FUNCTION PANEL
ALT: 20 U/L (ref 0–35)
AST: 20 U/L (ref 0–37)
Albumin: 4.4 g/dL (ref 3.5–5.2)
Alkaline Phosphatase: 51 U/L (ref 39–117)
Bilirubin, Direct: 0.2 mg/dL (ref 0.0–0.3)
Total Bilirubin: 0.7 mg/dL (ref 0.2–1.2)
Total Protein: 7 g/dL (ref 6.0–8.3)

## 2019-10-13 LAB — URINALYSIS, ROUTINE W REFLEX MICROSCOPIC
Bilirubin Urine: NEGATIVE
Hgb urine dipstick: NEGATIVE
Ketones, ur: NEGATIVE
Leukocytes,Ua: NEGATIVE
Nitrite: NEGATIVE
RBC / HPF: NONE SEEN (ref 0–?)
Specific Gravity, Urine: 1.015 (ref 1.000–1.030)
Total Protein, Urine: NEGATIVE
Urine Glucose: NEGATIVE
Urobilinogen, UA: 0.2 (ref 0.0–1.0)
pH: 6.5 (ref 5.0–8.0)

## 2019-10-13 LAB — BASIC METABOLIC PANEL
BUN: 12 mg/dL (ref 6–23)
CO2: 30 mEq/L (ref 19–32)
Calcium: 9.4 mg/dL (ref 8.4–10.5)
Chloride: 106 mEq/L (ref 96–112)
Creatinine, Ser: 0.69 mg/dL (ref 0.40–1.20)
GFR: 100.56 mL/min (ref >60.00)
Glucose, Bld: 107 mg/dL — ABNORMAL HIGH (ref 70–99)
Potassium: 3.8 mEq/L (ref 3.5–5.1)
Sodium: 145 mEq/L (ref 135–145)

## 2019-10-13 LAB — TSH: TSH: 1 u[IU]/mL (ref 0.35–4.50)

## 2019-10-13 LAB — VITAMIN B12: Vitamin B-12: 297 pg/mL (ref 211–911)

## 2019-10-13 LAB — VITAMIN D 25 HYDROXY (VIT D DEFICIENCY, FRACTURES): VITD: 56.7 ng/mL (ref 30.00–100.00)

## 2019-10-13 LAB — MICROALBUMIN / CREATININE URINE RATIO
Creatinine,U: 24.8 mg/dL
Microalb Creat Ratio: 2.8 mg/g (ref 0.0–30.0)
Microalb, Ur: <0.7 mg/dL (ref 0.0–1.9)

## 2019-10-13 NOTE — Assessment & Plan Note (Signed)
stable overall by history and exam, recent data reviewed with pt, and pt to continue medical treatment as before,  to f/u any worsening symptoms or concerns  

## 2019-10-13 NOTE — Assessment & Plan Note (Addendum)
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
## 2019-10-13 NOTE — Patient Instructions (Signed)

## 2019-10-26 ENCOUNTER — Encounter: Payer: Self-pay | Admitting: Internal Medicine

## 2019-10-26 DIAGNOSIS — M5440 Lumbago with sciatica, unspecified side: Secondary | ICD-10-CM | POA: Diagnosis not present

## 2019-10-26 DIAGNOSIS — R252 Cramp and spasm: Secondary | ICD-10-CM | POA: Diagnosis not present

## 2019-10-27 ENCOUNTER — Other Ambulatory Visit: Payer: Self-pay | Admitting: Internal Medicine

## 2019-10-27 NOTE — Telephone Encounter (Signed)
Please refill as per office routine med refill policy (all routine meds refilled for 3 mo or monthly per pt preference up to one year from last visit, then month to month grace period for 3 mo, then further med refills will have to be denied)  

## 2019-10-28 ENCOUNTER — Other Ambulatory Visit: Payer: Self-pay | Admitting: Internal Medicine

## 2019-10-28 NOTE — Telephone Encounter (Signed)
Please refill as per office routine med refill policy (all routine meds refilled for 3 mo or monthly per pt preference up to one year from last visit, then month to month grace period for 3 mo, then further med refills will have to be denied)  

## 2019-11-03 DIAGNOSIS — M65342 Trigger finger, left ring finger: Secondary | ICD-10-CM | POA: Insufficient documentation

## 2019-11-03 DIAGNOSIS — M79641 Pain in right hand: Secondary | ICD-10-CM | POA: Insufficient documentation

## 2019-11-03 DIAGNOSIS — M79645 Pain in left finger(s): Secondary | ICD-10-CM | POA: Diagnosis not present

## 2019-11-03 DIAGNOSIS — M1812 Unilateral primary osteoarthritis of first carpometacarpal joint, left hand: Secondary | ICD-10-CM | POA: Diagnosis not present

## 2019-11-09 NOTE — Progress Notes (Signed)
Patient Care Team: Biagio Borg, MD as PCP - Jordan Hawks, MD as PCP - Cardiology (Cardiology) Sylvan Cheese, NP as Nurse Practitioner (Hematology and Oncology)  DIAGNOSIS:    ICD-10-CM   1. Malignant neoplasm of upper-outer quadrant of right breast in female, estrogen receptor positive ([place])  C50.411    Z17.0     SUMMARY OF ONCOLOGIC HISTORY: Oncology History  Breast cancer of upper-outer quadrant of right female breast ([place])  09/18/2015 Initial Diagnosis   Screening right breast mass 9:00: 1 x 0.9 x 0.7 cm: Grade 2 IDC plus DCIS ER 100%, PR 90%, HER-2 negative ratio 1.38, Ki-67 10%; UOQ lesion 3 mm calcification plus distortion: Bx rec; T1 BN 0 stage IA clinical stage   10/17/2015 Surgery   Right lumpectomy ([facility]): IDC grade 2, 1.2 cm, intermediate grade DCIS, margins neg although < 0.1 cm to medial and superior margins, 0/1 LN negative, ER 100%, PR 90%, HER-2 negative ratio 1.37, T1 cN0 stage IA, Oncotype DX 15, 9% ROR, low risk   11/27/2015 - 01/15/2016 Radiation Therapy   Adjuvant radiation therapy [name]). Right breast: 50.4 Gy in 28 fractions. Right breast boost: 14 Gy in 7 fractions.    02/08/2016 -  Anti-estrogen oral therapy   Anastrozole 1 mg daily. Planned duration of therapy: 5 years.      CHIEF COMPLIANT: Follow-up of right breast cancer on anastrozole therapy  INTERVAL HISTORY: Denise [name] is a 74 y.o. with above-mentioned history of right breast cancer treated with lumpectomy, radiation, and who is currently on anastrozole therapy. Mammogram on 01/05/19 showed a 6.7cm postoperative seroma in the right breast and no evidence of malignancy bilaterally. She presents to the clinic today for annual follow-up.   ALLERGIES:  is allergic to doxazosin, clonidine hydrochloride, erythromycin, hydrocodone-acetaminophen, pork-derived products, and rosiglitazone maleate.  MEDICATIONS:  Current Outpatient Medications  Medication Sig Dispense  Refill  . albuterol (VENTOLIN HFA) 108 (90 Base) MCG/ACT inhaler Inhale 2 puffs into the lungs every 6 (six) hours as needed for wheezing or shortness of breath. 8.5 g 1  . amLODipine (NORVASC) 10 MG tablet TAKE 1 TABLET BY MOUTH ONCE DAILY. MUST KEEP FOLLOW-UP APPOINTMENT FOR FUTURE REFILLS 30 tablet 0  . amoxicillin (AMOXIL) 500 MG capsule Take 2,000 mg by mouth as directed.     Marland Kitchen anastrozole (ARIMIDEX) 1 MG tablet Take 1 tablet (1 mg total) by mouth daily. 90 tablet 3  . aspirin 81 MG EC tablet Take 81 mg by mouth daily.      Marland Kitchen atorvastatin (LIPITOR) 40 MG tablet Take 1 tablet (40 mg total) by mouth daily. Annual appt due in June must see provider for future refills 90 tablet 0  . budesonide-formoterol (SYMBICORT) 160-4.5 MCG/ACT inhaler Inhale 2 puffs by mouth twice daily 33 g 0  . Calcium-Magnesium-Vitamin D (CALCIUM 1200+D3 PO) Take 1 tablet by mouth daily.    . cetirizine (EQ ALLERGY RELIEF, CETIRIZINE,) 10 MG tablet TAKE 1 TABLET BY MOUTH ONCE DAILY AS NEEDED FOR  ALLERGIES (Patient taking differently: Take 10 mg by mouth daily as needed for allergies. ) 90 tablet 3  . Cholecalciferol (VITAMIN D-3) 1000 units CAPS Take 1,000 Units by mouth daily.    . fluticasone (FLONASE) 50 MCG/ACT nasal spray USE 2 SPRAYS INTO BOTH NOSTRILS DAILY (Patient taking differently: Place 2 sprays into both nostrils daily. ) 16 g 5  . furosemide (LASIX) 40 MG tablet Take 1 tablet by mouth twice daily 180 tablet 0  . lisinopril (ZESTRIL)  40 MG tablet Take 1 tablet by mouth once daily 90 tablet 1  . meclizine (ANTIVERT) 25 MG tablet Take 1 tablet (25 mg total) by mouth 3 (three) times daily as needed for dizziness. 30 tablet 0  . metFORMIN (GLUCOPHAGE) 500 MG tablet TAKE 2 TABLETS BY MOUTH TWICE DAILY WITH A MEAL 360 tablet 0  . Multiple Vitamins-Minerals (ALIVE WOMENS 50+) TABS Take 1 tablet by mouth daily.    . Omega-3 Fatty Acids (FISH OIL) 1000 MG CAPS Take 1,000 mg by mouth daily.    . ondansetron (ZOFRAN  ODT) 4 MG disintegrating tablet Take 1 tablet (4 mg total) by mouth every 8 (eight) hours as needed for nausea. 10 tablet 0  . Polyethyl Glycol-Propyl Glycol (SYSTANE ULTRA OP) Apply 1 drop to eye daily as needed (dry eyes).    . potassium chloride (MICRO-K) 10 MEQ CR capsule TAKE 4 CAPSULES BY MOUTH ONCE DAILY 360 capsule 0   Current Facility-Administered Medications  Medication Dose Route Frequency Provider Last Rate Last Admin  . albuterol (PROVENTIL) (2.5 MG/3ML) 0.083% nebulizer solution 2.5 mg  2.5 mg Nebulization Once Marrian Salvage, FNP        PHYSICAL EXAMINATION: ECOG PERFORMANCE STATUS: 1 - Symptomatic but completely ambulatory  Vitals:   11/10/19 0945  BP: (!) 147/62  Pulse: 71  Resp: 18  Temp: 98.3 F (36.8 C)  SpO2: 99%   Filed Weights   11/10/19 0945  Weight: 219 lb 1.6 oz (99.4 kg)    BREAST: Large seroma is palpated in the right breast. No palpable axillary supraclavicular or infraclavicular adenopathy no breast tenderness or nipple discharge. (exam performed in the presence of a chaperone)  LABORATORY DATA:  I have reviewed the data as listed CMP Latest Ref Rng & Units 10/13/2019 03/01/2019 10/20/2018  Glucose 70 - 99 mg/dL 107(H) 160(H) 121(H)  BUN 6 - 23 mg/dL 12 14 9   Creatinine 0.40 - 1.20 mg/dL 0.69 0.60 0.68  Sodium 135 - 145 mEq/L 145 143 141  Potassium 3.5 - 5.1 mEq/L 3.8 3.1(L) 3.7  Chloride 96 - 112 mEq/L 106 113(H) 104  CO2 19 - 32 mEq/L 30 24 30   Calcium 8.4 - 10.5 mg/dL 9.4 9.1 9.8  Total Protein 6.0 - 8.3 g/dL 7.0 7.0 7.6  Total Bilirubin 0.2 - 1.2 mg/dL 0.7 0.8 0.8  Alkaline Phos 39 - 117 U/L 51 46 69  AST 0 - 37 U/L 20 25 18   ALT 0 - 35 U/L 20 24 18     Lab Results  Component Value Date   WBC 7.6 10/13/2019   HGB 13.4 10/13/2019   HCT 40.7 10/13/2019   MCV 81.5 10/13/2019   PLT 183.0 10/13/2019   NEUTROABS 4.6 10/13/2019    ASSESSMENT & PLAN:  Breast cancer of upper-outer quadrant of right female breast ([place]) Right  lumpectomy 10/16/2068: IDC grade 2, 1.2 cm, intermediate grade DCIS, margins negative although less than 0.1 cm to medial margin and superior margin, 0/1 lymph node negative, ER 100%, PR 90%, HER-2 negative ratio 1.37, T1 cN0 stage IA Oncotype DX score 15: Risk of recurrence 9% Adjuvant radiation: 11/27/2015-01/15/2016  Current treatment: Anastrozole 1 mg daily started 02/08/2016  Anastrozole toxicities: Denies any hot flashes or myalgias Renewed her prescription  Diabetes: on metformin  Breast cancer surveillance: 1.Breast exam 11/10/2019: Benign 2.Mammogram  01/05/2019: Breast density categoryBpostsurgical and postradiation changes and large postoperative seroma 6.7 cm  Shes a Therapist, 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  The patient has a good understanding of the overall plan. she agrees with it. she will call with any problems that may develop before the next visit here.  Total time spent: 20 mins including face to face time and time spent for planning, charting and coordination of care  Nicholas Lose, MD 11/10/2019  I, Cloyde Reams Dorshimer, am acting as scribe for Dr. Nicholas Lose.  I have reviewed the above documentation for accuracy and completeness, and I agree with the above.

## 2019-11-10 ENCOUNTER — Inpatient Hospital Stay: Payer: Medicare Other | Attending: Hematology and Oncology | Admitting: Hematology and Oncology

## 2019-11-10 ENCOUNTER — Other Ambulatory Visit: Payer: Self-pay

## 2019-11-10 DIAGNOSIS — Z79811 Long term (current) use of aromatase inhibitors: Secondary | ICD-10-CM | POA: Insufficient documentation

## 2019-11-10 DIAGNOSIS — E119 Type 2 diabetes mellitus without complications: Secondary | ICD-10-CM | POA: Insufficient documentation

## 2019-11-10 DIAGNOSIS — Z170 Estrogen receptor positive status [ER+]: Secondary | ICD-10-CM | POA: Diagnosis not present

## 2019-11-10 DIAGNOSIS — Z79899 Other long term (current) drug therapy: Secondary | ICD-10-CM | POA: Insufficient documentation

## 2019-11-10 DIAGNOSIS — Z923 Personal history of irradiation: Secondary | ICD-10-CM | POA: Insufficient documentation

## 2019-11-10 DIAGNOSIS — C50411 Malignant neoplasm of upper-outer quadrant of right female breast: Secondary | ICD-10-CM

## 2019-11-10 DIAGNOSIS — Z7982 Long term (current) use of aspirin: Secondary | ICD-10-CM | POA: Diagnosis not present

## 2019-11-10 DIAGNOSIS — Z7984 Long term (current) use of oral hypoglycemic drugs: Secondary | ICD-10-CM | POA: Diagnosis not present

## 2019-11-10 MED ORDER — ANASTROZOLE 1 MG PO TABS: 1.0000 mg | ORAL_TABLET | Freq: Every day | ORAL | 3 refills | Status: DC

## 2019-11-10 NOTE — Assessment & Plan Note (Signed)
Right lumpectomy 10/16/2068: IDC grade 2, 1.2 cm, intermediate grade DCIS, margins negative although less than 0.1 cm to medial margin and superior margin, 0/1 lymph node negative, ER 100%, PR 90%, HER-2 negative ratio 1.37, T1 cN0 stage IA Oncotype DX score 15: Risk of recurrence 9% Adjuvant radiation: 11/27/2015-01/15/2016  Current treatment: Anastrozole 1 mg daily started 02/08/2016  Anastrozole toxicities: Denies any hot flashes or myalgias Renewed her prescription  Diabetes: on metformin  Breast cancer surveillance: 1.Breast exam 11/10/2019: Benign 2.Mammogram  01/05/2019: Breast density categoryBpostsurgical and postradiation changes and large postoperative seroma 6.7 cm  Shes a Therapist, 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

## 2019-11-11 ENCOUNTER — Telehealth: Payer: Self-pay | Admitting: Hematology and Oncology

## 2019-11-11 NOTE — Telephone Encounter (Signed)
Scheduled per 7/14 los. Called and left a msg, mailing appt letter and calendar printout

## 2019-11-21 ENCOUNTER — Other Ambulatory Visit: Payer: Self-pay | Admitting: Internal Medicine

## 2019-11-21 NOTE — Telephone Encounter (Signed)
Please refill as per office routine med refill policy (all routine meds refilled for 3 mo or monthly per pt preference up to one year from last visit, then month to month grace period for 3 mo, then further med refills will have to be denied)  

## 2019-11-23 DIAGNOSIS — C50911 Malignant neoplasm of unspecified site of right female breast: Secondary | ICD-10-CM | POA: Diagnosis not present

## 2019-11-23 DIAGNOSIS — E041 Nontoxic single thyroid nodule: Secondary | ICD-10-CM | POA: Diagnosis not present

## 2019-11-24 ENCOUNTER — Other Ambulatory Visit: Payer: Self-pay | Admitting: General Surgery

## 2019-11-24 DIAGNOSIS — E041 Nontoxic single thyroid nodule: Secondary | ICD-10-CM

## 2019-12-01 ENCOUNTER — Encounter: Payer: Self-pay | Admitting: Cardiovascular Disease

## 2019-12-01 ENCOUNTER — Other Ambulatory Visit: Payer: Self-pay

## 2019-12-01 ENCOUNTER — Ambulatory Visit (INDEPENDENT_AMBULATORY_CARE_PROVIDER_SITE_OTHER): Payer: Medicare Other | Admitting: Cardiovascular Disease

## 2019-12-01 VITALS — BP 140/90 | HR 86 | Temp 94.3°F | Ht 61.0 in | Wt 215.2 lb

## 2019-12-01 DIAGNOSIS — E7800 Pure hypercholesterolemia, unspecified: Secondary | ICD-10-CM

## 2019-12-01 DIAGNOSIS — I10 Essential (primary) hypertension: Secondary | ICD-10-CM

## 2019-12-01 DIAGNOSIS — M65342 Trigger finger, left ring finger: Secondary | ICD-10-CM | POA: Diagnosis not present

## 2019-12-01 DIAGNOSIS — I350 Nonrheumatic aortic (valve) stenosis: Secondary | ICD-10-CM | POA: Diagnosis not present

## 2019-12-01 DIAGNOSIS — Z952 Presence of prosthetic heart valve: Secondary | ICD-10-CM | POA: Diagnosis not present

## 2019-12-01 DIAGNOSIS — I6523 Occlusion and stenosis of bilateral carotid arteries: Secondary | ICD-10-CM | POA: Diagnosis not present

## 2019-12-01 DIAGNOSIS — M79645 Pain in left finger(s): Secondary | ICD-10-CM | POA: Diagnosis not present

## 2019-12-01 NOTE — Patient Instructions (Signed)
Medication Instructions:  Your physician recommends that you continue on your current medications as directed. Please refer to the Current Medication list given to you today.  *If you need a refill on your cardiac medications before your next appointment, please call your pharmacy*  Lab Work: NONE  Testing/Procedures: Your physician has requested that you have a carotid duplex. This test is an ultrasound of the carotid arteries in your neck. It looks at blood flow through these arteries that supply the brain with blood. Allow one hour for this exam. There are no restrictions or special instructions.  Follow-Up: At [facility], you and your health needs are our priority.  As part of our continuing mission to provide you with exceptional heart care, we have created designated Provider Care Teams.  These Care Teams include your primary Cardiologist (physician) and Advanced Practice Providers (APPs -  Physician Assistants and Nurse Practitioners) who all work together to provide you with the care you need, when you need it.  We recommend signing up for the patient portal called "MyChart".  Sign up information is provided on this After Visit Summary.  MyChart is used to connect with patients for Virtual Visits (Telemedicine).  Patients are able to view lab/test results, encounter notes, upcoming appointments, etc.  Non-urgent messages can be sent to your provider as well.   To learn more about what you can do with MyChart, go to [email or web address].    Your next appointment:   6 month(s)  The format for your next appointment:   In Person  Provider:   You may see Skeet Latch, MD or one of the following Advanced Practice Providers on your designated Care Team:    Kerin Ransom, PA-C  [place], [place]  Coletta Memos, [place]  Other Instructions  BEFORE YOU REFILL YOU LISINOPRIL CALL [phone] AND GIVE UPDATE ON BLOOD PRESSURE READINGS
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
## 2019-12-02 ENCOUNTER — Ambulatory Visit
Admission: RE | Admit: 2019-12-02 | Discharge: 2019-12-02 | Disposition: A | Discharge status: Home / Self Care | Payer: Medicare Other | Source: Ambulatory Visit | Attending: General Surgery | Admitting: General Surgery

## 2019-12-02 DIAGNOSIS — E041 Nontoxic single thyroid nodule: Secondary | ICD-10-CM | POA: Diagnosis not present

## 2019-12-06 ENCOUNTER — Other Ambulatory Visit: Payer: Self-pay | Admitting: Internal Medicine

## 2019-12-23 ENCOUNTER — Other Ambulatory Visit: Payer: Self-pay | Admitting: Internal Medicine

## 2020-01-04 ENCOUNTER — Other Ambulatory Visit: Payer: Self-pay

## 2020-01-04 ENCOUNTER — Ambulatory Visit (HOSPITAL_COMMUNITY)
Admission: RE | Admit: 2020-01-04 | Discharge: 2020-01-04 | Disposition: A | Discharge status: Home / Self Care | Payer: Medicare Other | Source: Ambulatory Visit | Attending: Cardiovascular Disease | Admitting: Cardiovascular Disease

## 2020-01-04 ENCOUNTER — Other Ambulatory Visit (HOSPITAL_COMMUNITY): Payer: Self-pay | Admitting: Cardiovascular Disease

## 2020-01-04 DIAGNOSIS — I6523 Occlusion and stenosis of bilateral carotid arteries: Secondary | ICD-10-CM

## 2020-01-10 ENCOUNTER — Emergency Department (HOSPITAL_COMMUNITY)
Admission: EM | Admit: 2020-01-10 | Discharge: 2020-01-10 | Disposition: A | Discharge status: Home / Self Care | Payer: Medicare Other | Attending: Emergency Medicine | Admitting: Emergency Medicine

## 2020-01-10 ENCOUNTER — Encounter (HOSPITAL_COMMUNITY): Payer: Self-pay | Admitting: Emergency Medicine

## 2020-01-10 ENCOUNTER — Emergency Department (HOSPITAL_COMMUNITY): Payer: Medicare Other

## 2020-01-10 ENCOUNTER — Other Ambulatory Visit: Payer: Self-pay

## 2020-01-10 DIAGNOSIS — Z79899 Other long term (current) drug therapy: Secondary | ICD-10-CM | POA: Diagnosis not present

## 2020-01-10 DIAGNOSIS — E119 Type 2 diabetes mellitus without complications: Secondary | ICD-10-CM | POA: Insufficient documentation

## 2020-01-10 DIAGNOSIS — R079 Chest pain, unspecified: Secondary | ICD-10-CM | POA: Diagnosis not present

## 2020-01-10 DIAGNOSIS — Z853 Personal history of malignant neoplasm of breast: Secondary | ICD-10-CM | POA: Diagnosis not present

## 2020-01-10 DIAGNOSIS — Z87891 Personal history of nicotine dependence: Secondary | ICD-10-CM | POA: Diagnosis not present

## 2020-01-10 DIAGNOSIS — Z7982 Long term (current) use of aspirin: Secondary | ICD-10-CM | POA: Insufficient documentation

## 2020-01-10 DIAGNOSIS — J45909 Unspecified asthma, uncomplicated: Secondary | ICD-10-CM | POA: Insufficient documentation

## 2020-01-10 DIAGNOSIS — Z952 Presence of prosthetic heart valve: Secondary | ICD-10-CM | POA: Diagnosis not present

## 2020-01-10 DIAGNOSIS — Z96659 Presence of unspecified artificial knee joint: Secondary | ICD-10-CM | POA: Diagnosis not present

## 2020-01-10 DIAGNOSIS — I10 Essential (primary) hypertension: Secondary | ICD-10-CM | POA: Insufficient documentation

## 2020-01-10 DIAGNOSIS — Z7984 Long term (current) use of oral hypoglycemic drugs: Secondary | ICD-10-CM | POA: Diagnosis not present

## 2020-01-10 DIAGNOSIS — R0789 Other chest pain: Secondary | ICD-10-CM

## 2020-01-10 LAB — BASIC METABOLIC PANEL
Anion gap: 9 (ref 5–15)
BUN: 10 mg/dL (ref 8–23)
CO2: 27 mmol/L (ref 22–32)
Calcium: 9.3 mg/dL (ref 8.9–10.3)
Chloride: 107 mmol/L (ref 98–111)
Creatinine, Ser: 0.72 mg/dL (ref 0.44–1.00)
GFR calc Af Amer: >60 mL/min (ref >60)
GFR calc non Af Amer: >60 mL/min (ref >60)
Glucose, Bld: 93 mg/dL (ref 70–99)
Potassium: 3.7 mmol/L (ref 3.5–5.1)
Sodium: 143 mmol/L (ref 135–145)

## 2020-01-10 LAB — CBC
HCT: 41 % (ref 36.0–46.0)
Hemoglobin: 13 g/dL (ref 12.0–15.0)
MCH: 26.6 pg (ref 26.0–34.0)
MCHC: 31.7 g/dL (ref 30.0–36.0)
MCV: 84 fL (ref 80.0–100.0)
Platelets: 186 10*3/uL (ref 150–400)
RBC: 4.88 MIL/uL (ref 3.87–5.11)
RDW: 15.2 % (ref 11.5–15.5)
WBC: 9.6 10*3/uL (ref 4.0–10.5)
nRBC: 0 % (ref 0.0–0.2)

## 2020-01-10 LAB — TROPONIN I (HIGH SENSITIVITY): Troponin I (High Sensitivity): 9 ng/L (ref <18)

## 2020-01-10 MED ORDER — ACETAMINOPHEN 500 MG PO TABS
1000.0000 mg | ORAL_TABLET | Freq: Once | ORAL | Status: AC
  Administered 2020-01-10: 1000 mg via ORAL
  Filled 2020-01-10: qty 2

## 2020-01-10 MED ORDER — ONDANSETRON 4 MG PO TBDP
4.0000 mg | ORAL_TABLET | Freq: Once | ORAL | Status: AC
  Administered 2020-01-10: 4 mg via ORAL
  Filled 2020-01-10: qty 1

## 2020-01-10 MED ORDER — KETOROLAC TROMETHAMINE 60 MG/2ML IM SOLN
15.0000 mg | Freq: Once | INTRAMUSCULAR | Status: AC
  Administered 2020-01-10: 15 mg via INTRAMUSCULAR
  Filled 2020-01-10: qty 2

## 2020-01-10 MED ORDER — OXYCODONE HCL 5 MG PO TABS
5.0000 mg | ORAL_TABLET | Freq: Once | ORAL | Status: AC
  Administered 2020-01-10: 5 mg via ORAL
  Filled 2020-01-10: qty 1

## 2020-01-10 MED ORDER — DICLOFENAC SODIUM 1 % EX GEL: 4.0000 g | Freq: Four times a day (QID) | CUTANEOUS | 0 refills | Status: DC

## 2020-01-10 NOTE — ED Notes (Signed)
Patient Alert and oriented to baseline. Stable and ambulatory to baseline. Patient verbalized understanding of the discharge instructions.  Patient belongings were taken by the patient.   
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
## 2020-01-10 NOTE — ED Triage Notes (Signed)
Pt reports right sided chest pain (under breast) that started last Thursday.  She thought she might have "pinched it as I leaned over a table, however the pain is only getting worse especially when I breath in."

## 2020-01-10 NOTE — ED Notes (Signed)
Pt not responding to vitals recheck  

## 2020-01-10 NOTE — Discharge Instructions (Addendum)
Take tylenol 1000mg (2 extra strength) four times a day. Use the gel as prescribed.  Use the incentive spirometer 10 minutes out of every hour that you are awake.  If you need to cough or take a deep breath please use a pillow to hold against the side of your chest that can help you with your symptoms.  Please return for worsening trouble breathing or if you develop a fever.

## 2020-01-11 ENCOUNTER — Encounter: Payer: Self-pay | Admitting: Internal Medicine

## 2020-01-29 ENCOUNTER — Other Ambulatory Visit: Payer: Self-pay | Admitting: Internal Medicine

## 2020-01-29 NOTE — Telephone Encounter (Signed)
Please refill as per office routine med refill policy (all routine meds refilled for 3 mo or monthly per pt preference up to one year from last visit, then month to month grace period for 3 mo, then further med refills will have to be denied)  

## 2020-02-16 ENCOUNTER — Other Ambulatory Visit: Payer: Self-pay | Admitting: Cardiovascular Disease

## 2020-02-16 DIAGNOSIS — I10 Essential (primary) hypertension: Secondary | ICD-10-CM

## 2020-02-16 DIAGNOSIS — Z5181 Encounter for therapeutic drug level monitoring: Secondary | ICD-10-CM

## 2020-02-16 MED ORDER — VALSARTAN 40 MG PO TABS: 40.0000 mg | ORAL_TABLET | Freq: Every day | ORAL | 1 refills | Status: DC

## 2020-02-16 NOTE — Telephone Encounter (Signed)
*  STAT* If patient is at the pharmacy, call can be transferred to refill team.   1. Which medications need to be refilled? (please list name of each medication and dose if known)   lisinopril (ZESTRIL) 40 MG tablet    2. Which pharmacy/location (including street and city if local pharmacy) is medication to be sent to? [place], [place] - [address]  3. Do they need a 30 day or 90 day supply? 90 day supply   Pt states the pharmacy advised her Dr. Oval Linsey denied the request, but did not send an alternative medication. Pt is completely out of medication.

## 2020-02-17 NOTE — Telephone Encounter (Signed)
Pt is no longer taking Lisinopril 40mg . There was a change in therapy on 02/16/20. Dr. Oval Linsey sent Valsartan 40mg  to pt's pharmacy

## 2020-02-22 ENCOUNTER — Other Ambulatory Visit: Payer: Self-pay | Admitting: Cardiovascular Disease

## 2020-02-24 ENCOUNTER — Other Ambulatory Visit: Payer: Self-pay | Admitting: Internal Medicine

## 2020-02-24 NOTE — Telephone Encounter (Signed)
Please refill as per office routine med refill policy (all routine meds refilled for 3 mo or monthly per pt preference up to one year from last visit, then month to month grace period for 3 mo, then further med refills will have to be denied)  

## 2020-02-26 ENCOUNTER — Telehealth: Payer: Self-pay | Admitting: Cardiology

## 2020-02-26 NOTE — Telephone Encounter (Signed)
Pt called concern on valsartan with BP since starting 249 systolic then 324 now 199, she is tired and lightheaded, she will take valsartan everyother day and call in BP readings monday.

## 2020-02-28 NOTE — Telephone Encounter (Signed)
Taking valsartan every other day does not make much sense and will likely have her blood pressure going up and down.  Recommend reducing it to 20 mg and taking it daily.

## 2020-02-28 NOTE — Telephone Encounter (Signed)
Advised patient, verbalized understanding  

## 2020-02-28 NOTE — Addendum Note (Signed)
Addended by: Alvina Filbert B on: 02/28/2020 04:38 PM   Modules accepted: Orders

## 2020-03-07 LAB — BASIC METABOLIC PANEL
BUN/Creatinine Ratio: 13 (ref 12–28)
BUN: 9 mg/dL (ref 8–27)
CO2: 25 mmol/L (ref 20–29)
Calcium: 9.7 mg/dL (ref 8.7–10.3)
Chloride: 105 mmol/L (ref 96–106)
Creatinine, Ser: 0.72 mg/dL (ref 0.57–1.00)
GFR calc Af Amer: 95 mL/min/{1.73_m2} (ref >59)
GFR calc non Af Amer: 83 mL/min/{1.73_m2} (ref >59)
Glucose: 126 mg/dL — ABNORMAL HIGH (ref 65–99)
Potassium: 4.2 mmol/L (ref 3.5–5.2)
Sodium: 145 mmol/L — ABNORMAL HIGH (ref 134–144)

## 2020-03-09 NOTE — Telephone Encounter (Signed)
OK - thanks

## 2020-03-09 NOTE — Telephone Encounter (Signed)
Spoke with Denise [name], Denise [name] reports for the last 3 days she has taken 40 mg of valsartan once daily. Currently her bp is 139/87 with pulse 69. She reports feeling okay with no symptoms. Advised the Denise [name] not to take her bp anymore today. She will take the valsartan 20 mg tomorrow morning and then take her bp 2 hours later and report that number. Will make dr Oval Linsey aware of bp readings.

## 2020-03-09 NOTE — Telephone Encounter (Signed)
Pt c/o BP issue: STAT if pt c/o blurred vision, one-sided weakness or slurred speech  1. What are your last 5 BP readings?  160/95 163/95 150/78 140/83  2. Are you having any other symptoms (ex. Dizziness, headache, blurred vision, passed out)? No   3. What is your BP issue? Patient is following up. She states her BP is now elevated due to medication adjustments. She is requesting a call back to further discuss.

## 2020-03-13 ENCOUNTER — Encounter: Payer: Self-pay | Admitting: Internal Medicine

## 2020-03-16 ENCOUNTER — Other Ambulatory Visit: Payer: Self-pay | Admitting: Internal Medicine

## 2020-03-16 NOTE — Telephone Encounter (Signed)
Please refill as per office routine med refill policy (all routine meds refilled for 3 mo or monthly per pt preference up to one year from last visit, then month to month grace period for 3 mo, then further med refills will have to be denied)  

## 2020-03-28 ENCOUNTER — Other Ambulatory Visit: Payer: Self-pay | Admitting: Internal Medicine

## 2020-04-03 DIAGNOSIS — H04123 Dry eye syndrome of bilateral lacrimal glands: Secondary | ICD-10-CM | POA: Diagnosis not present

## 2020-04-07 ENCOUNTER — Emergency Department (HOSPITAL_COMMUNITY)
Admission: EM | Admit: 2020-04-07 | Discharge: 2020-04-07 | Disposition: A | Discharge status: Home / Self Care | Payer: Medicare Other | Attending: Emergency Medicine | Admitting: Emergency Medicine

## 2020-04-07 ENCOUNTER — Emergency Department (HOSPITAL_COMMUNITY): Payer: Medicare Other

## 2020-04-07 ENCOUNTER — Encounter (HOSPITAL_COMMUNITY): Payer: Self-pay | Admitting: Emergency Medicine

## 2020-04-07 ENCOUNTER — Other Ambulatory Visit: Payer: Self-pay

## 2020-04-07 ENCOUNTER — Ambulatory Visit (HOSPITAL_COMMUNITY)
Admission: EM | Admit: 2020-04-07 | Discharge: 2020-04-07 | Disposition: A | Discharge status: Home / Self Care | Payer: Medicare Other

## 2020-04-07 DIAGNOSIS — Z7982 Long term (current) use of aspirin: Secondary | ICD-10-CM | POA: Diagnosis not present

## 2020-04-07 DIAGNOSIS — Z87891 Personal history of nicotine dependence: Secondary | ICD-10-CM | POA: Insufficient documentation

## 2020-04-07 DIAGNOSIS — Z96651 Presence of right artificial knee joint: Secondary | ICD-10-CM | POA: Insufficient documentation

## 2020-04-07 DIAGNOSIS — J45901 Unspecified asthma with (acute) exacerbation: Secondary | ICD-10-CM | POA: Diagnosis not present

## 2020-04-07 DIAGNOSIS — E7800 Pure hypercholesterolemia, unspecified: Secondary | ICD-10-CM | POA: Insufficient documentation

## 2020-04-07 DIAGNOSIS — R0789 Other chest pain: Secondary | ICD-10-CM | POA: Diagnosis not present

## 2020-04-07 DIAGNOSIS — Z96652 Presence of left artificial knee joint: Secondary | ICD-10-CM | POA: Insufficient documentation

## 2020-04-07 DIAGNOSIS — E1169 Type 2 diabetes mellitus with other specified complication: Secondary | ICD-10-CM | POA: Insufficient documentation

## 2020-04-07 DIAGNOSIS — Z96611 Presence of right artificial shoulder joint: Secondary | ICD-10-CM | POA: Insufficient documentation

## 2020-04-07 DIAGNOSIS — C50919 Malignant neoplasm of unspecified site of unspecified female breast: Secondary | ICD-10-CM | POA: Diagnosis not present

## 2020-04-07 DIAGNOSIS — M546 Pain in thoracic spine: Secondary | ICD-10-CM | POA: Insufficient documentation

## 2020-04-07 DIAGNOSIS — Z7984 Long term (current) use of oral hypoglycemic drugs: Secondary | ICD-10-CM | POA: Diagnosis not present

## 2020-04-07 DIAGNOSIS — I10 Essential (primary) hypertension: Secondary | ICD-10-CM | POA: Diagnosis not present

## 2020-04-07 DIAGNOSIS — J9811 Atelectasis: Secondary | ICD-10-CM | POA: Diagnosis not present

## 2020-04-07 DIAGNOSIS — R079 Chest pain, unspecified: Secondary | ICD-10-CM | POA: Diagnosis not present

## 2020-04-07 DIAGNOSIS — Z853 Personal history of malignant neoplasm of breast: Secondary | ICD-10-CM | POA: Diagnosis not present

## 2020-04-07 DIAGNOSIS — Z79899 Other long term (current) drug therapy: Secondary | ICD-10-CM | POA: Diagnosis not present

## 2020-04-07 LAB — BASIC METABOLIC PANEL
Anion gap: 12 (ref 5–15)
BUN: 7 mg/dL — ABNORMAL LOW (ref 8–23)
CO2: 25 mmol/L (ref 22–32)
Calcium: 9.9 mg/dL (ref 8.9–10.3)
Chloride: 103 mmol/L (ref 98–111)
Creatinine, Ser: 0.66 mg/dL (ref 0.44–1.00)
GFR, Estimated: >60 mL/min (ref >60)
Glucose, Bld: 86 mg/dL (ref 70–99)
Potassium: 3.7 mmol/L (ref 3.5–5.1)
Sodium: 140 mmol/L (ref 135–145)

## 2020-04-07 LAB — CBC
HCT: 43.8 % (ref 36.0–46.0)
Hemoglobin: 14.1 g/dL (ref 12.0–15.0)
MCH: 26.4 pg (ref 26.0–34.0)
MCHC: 32.2 g/dL (ref 30.0–36.0)
MCV: 81.9 fL (ref 80.0–100.0)
Platelets: 222 10*3/uL (ref 150–400)
RBC: 5.35 MIL/uL — ABNORMAL HIGH (ref 3.87–5.11)
RDW: 14.8 % (ref 11.5–15.5)
WBC: 9.9 10*3/uL (ref 4.0–10.5)
nRBC: 0 % (ref 0.0–0.2)

## 2020-04-07 LAB — TROPONIN I (HIGH SENSITIVITY)
Troponin I (High Sensitivity): 9 ng/L (ref <18)
Troponin I (High Sensitivity): 7 ng/L (ref <18)

## 2020-04-07 MED ORDER — OXYCODONE-ACETAMINOPHEN 5-325 MG PO TABS: 1.0000 | ORAL_TABLET | Freq: Four times a day (QID) | ORAL | 0 refills | Status: DC | PRN

## 2020-04-07 MED ORDER — IOHEXOL 350 MG/ML SOLN
100.0000 mL | Freq: Once | INTRAVENOUS | Status: AC | PRN
  Administered 2020-04-07: 100 mL via INTRAVENOUS

## 2020-04-07 MED ORDER — ONDANSETRON HCL 4 MG PO TABS
4.0000 mg | ORAL_TABLET | Freq: Once | ORAL | Status: AC
  Administered 2020-04-07: 4 mg via ORAL
  Filled 2020-04-07: qty 1

## 2020-04-07 MED ORDER — ACYCLOVIR 400 MG PO TABS: 400.0000 mg | ORAL_TABLET | Freq: Four times a day (QID) | ORAL | 0 refills | Status: DC

## 2020-04-07 MED ORDER — HYDROCODONE-ACETAMINOPHEN 5-325 MG PO TABS
1.0000 | ORAL_TABLET | Freq: Once | ORAL | Status: AC
Start: 2020-04-07 — End: 2020-04-07
  Administered 2020-04-07: 1 via ORAL
  Filled 2020-04-07: qty 1

## 2020-04-07 NOTE — ED Notes (Signed)
Pt transported to CT at this time.

## 2020-04-07 NOTE — ED Notes (Signed)
Reviewed discharge instructions with patient. Follow-up care and medications reviewed. Patient and verbalized understanding. Patient A&Ox4, VSS upon discharge.
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
## 2020-04-07 NOTE — Discharge Instructions (Addendum)
Return for any problem.  Follow-up with your regular care provider as instructed.  Use Percocet as prescribed for pain.  If rash develops consistent with shingles, then start taking Acyclovir as prescribed.   Your CT imaging showed a possible nodule in the left upper lung.  This requires close follow-up (within 3-4 weeks) with your regular care provider.  Repeat CT imaging on an outpatient basis may be required.

## 2020-04-07 NOTE — ED Triage Notes (Signed)
Pt c/o left side cp radiating to her shoulder, arm and back for the past 3 days getting worse yesterday.

## 2020-04-07 NOTE — ED Triage Notes (Addendum)
Pt presents with upper back pain around shoulder blade area. States started yesterday. States took 2 extra strength tylenol with some relief. States continued through the night. C/o of SOB and arm pain.   Patient is being discharged from the Urgent Care and sent to the Emergency Department via POV . Per Claris Gower, NP, patient is in need of higher level of care due to cardiac hx and c/o of chest and back pain. Patient is aware and verbalizes understanding of plan of care.  Vitals:   04/07/20 1324  BP: (!) 156/79  Pulse: 90  Resp: 18  Temp: 98 F (36.7 C)  SpO2: 98%

## 2020-04-10 ENCOUNTER — Other Ambulatory Visit: Payer: Self-pay | Admitting: General Surgery

## 2020-04-10 DIAGNOSIS — Z1231 Encounter for screening mammogram for malignant neoplasm of breast: Secondary | ICD-10-CM

## 2020-04-11 ENCOUNTER — Telehealth: Payer: Self-pay | Admitting: Internal Medicine

## 2020-04-11 ENCOUNTER — Encounter (HOSPITAL_COMMUNITY): Payer: Self-pay | Admitting: Emergency Medicine

## 2020-04-11 ENCOUNTER — Emergency Department (HOSPITAL_COMMUNITY): Payer: Medicare Other

## 2020-04-11 ENCOUNTER — Emergency Department (HOSPITAL_COMMUNITY)
Admission: EM | Admit: 2020-04-11 | Discharge: 2020-04-11 | Disposition: A | Discharge status: Home / Self Care | Payer: Medicare Other | Attending: Emergency Medicine | Admitting: Emergency Medicine

## 2020-04-11 DIAGNOSIS — Z7984 Long term (current) use of oral hypoglycemic drugs: Secondary | ICD-10-CM | POA: Insufficient documentation

## 2020-04-11 DIAGNOSIS — M546 Pain in thoracic spine: Secondary | ICD-10-CM | POA: Insufficient documentation

## 2020-04-11 DIAGNOSIS — Z87891 Personal history of nicotine dependence: Secondary | ICD-10-CM | POA: Insufficient documentation

## 2020-04-11 DIAGNOSIS — Z79899 Other long term (current) drug therapy: Secondary | ICD-10-CM | POA: Diagnosis not present

## 2020-04-11 DIAGNOSIS — E119 Type 2 diabetes mellitus without complications: Secondary | ICD-10-CM | POA: Diagnosis not present

## 2020-04-11 DIAGNOSIS — Z853 Personal history of malignant neoplasm of breast: Secondary | ICD-10-CM | POA: Diagnosis not present

## 2020-04-11 DIAGNOSIS — R0789 Other chest pain: Secondary | ICD-10-CM

## 2020-04-11 DIAGNOSIS — J45901 Unspecified asthma with (acute) exacerbation: Secondary | ICD-10-CM | POA: Diagnosis not present

## 2020-04-11 DIAGNOSIS — Z96653 Presence of artificial knee joint, bilateral: Secondary | ICD-10-CM | POA: Insufficient documentation

## 2020-04-11 DIAGNOSIS — M549 Dorsalgia, unspecified: Secondary | ICD-10-CM

## 2020-04-11 DIAGNOSIS — Z7982 Long term (current) use of aspirin: Secondary | ICD-10-CM | POA: Diagnosis not present

## 2020-04-11 DIAGNOSIS — J90 Pleural effusion, not elsewhere classified: Secondary | ICD-10-CM | POA: Diagnosis not present

## 2020-04-11 DIAGNOSIS — Z7951 Long term (current) use of inhaled steroids: Secondary | ICD-10-CM | POA: Diagnosis not present

## 2020-04-11 DIAGNOSIS — I10 Essential (primary) hypertension: Secondary | ICD-10-CM | POA: Insufficient documentation

## 2020-04-11 DIAGNOSIS — R079 Chest pain, unspecified: Secondary | ICD-10-CM | POA: Diagnosis not present

## 2020-04-11 DIAGNOSIS — I517 Cardiomegaly: Secondary | ICD-10-CM | POA: Diagnosis not present

## 2020-04-11 LAB — BASIC METABOLIC PANEL
Anion gap: 9 (ref 5–15)
BUN: 7 mg/dL — ABNORMAL LOW (ref 8–23)
CO2: 27 mmol/L (ref 22–32)
Calcium: 9.5 mg/dL (ref 8.9–10.3)
Chloride: 104 mmol/L (ref 98–111)
Creatinine, Ser: 0.72 mg/dL (ref 0.44–1.00)
GFR, Estimated: >60 mL/min (ref >60)
Glucose, Bld: 122 mg/dL — ABNORMAL HIGH (ref 70–99)
Potassium: 4 mmol/L (ref 3.5–5.1)
Sodium: 140 mmol/L (ref 135–145)

## 2020-04-11 LAB — CBC
HCT: 44.9 % (ref 36.0–46.0)
Hemoglobin: 14.4 g/dL (ref 12.0–15.0)
MCH: 26.6 pg (ref 26.0–34.0)
MCHC: 32.1 g/dL (ref 30.0–36.0)
MCV: 82.8 fL (ref 80.0–100.0)
Platelets: 216 10*3/uL (ref 150–400)
RBC: 5.42 MIL/uL — ABNORMAL HIGH (ref 3.87–5.11)
RDW: 14.9 % (ref 11.5–15.5)
WBC: 8.5 10*3/uL (ref 4.0–10.5)
nRBC: 0 % (ref 0.0–0.2)

## 2020-04-11 LAB — TROPONIN I (HIGH SENSITIVITY)
Troponin I (High Sensitivity): 8 ng/L (ref <18)
Troponin I (High Sensitivity): 7 ng/L (ref <18)

## 2020-04-11 MED ORDER — LIDOCAINE 5 % EX PTCH: 1.0000 | MEDICATED_PATCH | CUTANEOUS | 0 refills | Status: DC

## 2020-04-11 MED ORDER — METHOCARBAMOL 500 MG PO TABS: 500.0000 mg | ORAL_TABLET | Freq: Two times a day (BID) | ORAL | 0 refills | Status: AC

## 2020-04-11 MED ORDER — MORPHINE SULFATE (PF) 4 MG/ML IV SOLN
4.0000 mg | Freq: Once | INTRAVENOUS | Status: AC
  Administered 2020-04-11: 4 mg via INTRAMUSCULAR
  Filled 2020-04-11: qty 1

## 2020-04-11 MED ORDER — ONDANSETRON 4 MG PO TBDP
4.0000 mg | ORAL_TABLET | Freq: Once | ORAL | Status: AC
  Administered 2020-04-11: 4 mg via ORAL
  Filled 2020-04-11: qty 1

## 2020-04-11 MED ORDER — OXYCODONE-ACETAMINOPHEN 5-325 MG PO TABS: 1.0000 | ORAL_TABLET | Freq: Four times a day (QID) | ORAL | 0 refills | Status: DC | PRN

## 2020-04-11 MED ORDER — LIDOCAINE 5 % EX PTCH
1.0000 | MEDICATED_PATCH | CUTANEOUS | Status: DC
  Administered 2020-04-11: 1 via TRANSDERMAL
  Filled 2020-04-11: qty 1

## 2020-04-11 NOTE — ED Provider Notes (Signed)
Attestation: Medical screening examination/treatment/procedure(s) were conducted as a shared visit with non-physician practitioner(s) and myself.  I personally evaluated the patient during the encounter.   Briefly, the patient is a 74 y.o. female with h/o anemia, anxiety, aortic stenosis, depression, diabetes, hyperlipidemia, hypertension, s/p TAVR, spinal stenosis, who presents to the emergency department today for evaluation of left upper back pain.  Pain radiates around to the left side of the chest. Seen for the same last week and had negative cardiac work up, negative CTA.   Vitals:   04/11/20 2152 04/11/20 2250  BP:  (!) 175/76  Pulse: 76 86  Resp: 20 20  Temp: 98.9 F (37.2 C) 97.8 F (36.6 C)  SpO2: 99% 100%    CONSTITUTIONAL:  well-appearing, NAD NEURO:  Alert and oriented x 3, no focal deficits EYES:  pupils equal and reactive ENT/NECK:  trachea midline, no JVD CARDIO:  reg rate, reg rhythm, well-perfused PULM:  None labored breathing GI/GU:  Abdomen non-distended MSK/SPINE:  No gross deformities, no edema. TTP to left upper chest and left parascapular musculature. SKIN:  no rash, atraumatic PSYCH:  Appropriate speech and behavior   EKG Interpretation  Date/Time:  Tuesday April 11 2020 11:24:35 EST Ventricular Rate:  76 PR Interval:  170 QRS Duration: 76 QT Interval:  364 QTC Calculation: 409 R Axis:   26 Text Interpretation: Normal sinus rhythm Cannot rule out Anterior infarct , age undetermined Abnormal ECG No significant change since last tracing Confirmed by Dorie Rank [phone]) on 04/11/2020 10:14:58 PM       Most likely muscle strain/spasm. EKG w/o acute ischemic changes. Negative trops. doubt PE or dissection. Chest x-ray without evidence suggestive of pneumonia, pneumothorax, pneumomediastinum.  No abnormal contour of the mediastinum to suggest dissection. No evidence of acute injuries.  The patient appears reasonably screened and/or stabilized for  discharge and I doubt any other medical condition or other [facility] requiring further screening, evaluation, or treatment in the ED at this time prior to discharge. Safe for discharge with strict return precautions.       Fatima Blank, MD 04/11/20 [phone]

## 2020-04-11 NOTE — Telephone Encounter (Signed)
   Patient calling to report chest pain. Recent visit to ED, however symptoms continue. Call transferred to [facility]

## 2020-04-11 NOTE — Discharge Instructions (Addendum)
Prescription given for Robaxin which is a muscle relaxer. Take medication as directed and do not operate machinery, drive a car, or work while taking this medication as it can make you drowsy.   Use the lidoderm patches as directed  You may use over-the-counter topical muscle creams such as SalonPas, [employer], Bengay, etc. Please stretch, apply heat, and have massage therapy for additional assistance.  You can continue the percocet but it will also make you drowsy so do not take it while working or operating machinery.   Please follow up with your primary care provider within 5-7 days for re-evaluation of your symptoms.   Please return to the emergency department for any new or worsening symptoms.

## 2020-04-11 NOTE — ED Triage Notes (Signed)
Pt reports L chest pain, was seen here Friday and sent home with pain medications that she reports is not helping.

## 2020-04-11 NOTE — Telephone Encounter (Signed)
Patient called 911 and is going to the ED
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
## 2020-04-13 ENCOUNTER — Encounter: Payer: Self-pay | Admitting: Internal Medicine

## 2020-04-13 ENCOUNTER — Other Ambulatory Visit: Payer: Self-pay

## 2020-04-13 ENCOUNTER — Ambulatory Visit (INDEPENDENT_AMBULATORY_CARE_PROVIDER_SITE_OTHER): Payer: Medicare Other | Admitting: Internal Medicine

## 2020-04-13 VITALS — BP 150/86 | HR 86 | Temp 98.6°F | Ht 61.0 in | Wt 220.0 lb

## 2020-04-13 DIAGNOSIS — E119 Type 2 diabetes mellitus without complications: Secondary | ICD-10-CM | POA: Diagnosis not present

## 2020-04-13 DIAGNOSIS — I6523 Occlusion and stenosis of bilateral carotid arteries: Secondary | ICD-10-CM | POA: Diagnosis not present

## 2020-04-13 DIAGNOSIS — R079 Chest pain, unspecified: Secondary | ICD-10-CM | POA: Diagnosis not present

## 2020-04-13 DIAGNOSIS — E7800 Pure hypercholesterolemia, unspecified: Secondary | ICD-10-CM

## 2020-04-13 DIAGNOSIS — I700 Atherosclerosis of aorta: Secondary | ICD-10-CM

## 2020-04-13 DIAGNOSIS — I10 Essential (primary) hypertension: Secondary | ICD-10-CM

## 2020-04-13 DIAGNOSIS — M549 Dorsalgia, unspecified: Secondary | ICD-10-CM | POA: Insufficient documentation

## 2020-04-13 DIAGNOSIS — I2584 Coronary atherosclerosis due to calcified coronary lesion: Secondary | ICD-10-CM

## 2020-04-13 DIAGNOSIS — I2510 Atherosclerotic heart disease of native coronary artery without angina pectoris: Secondary | ICD-10-CM

## 2020-04-13 DIAGNOSIS — Z23 Encounter for immunization: Secondary | ICD-10-CM | POA: Diagnosis not present

## 2020-04-13 DIAGNOSIS — R918 Other nonspecific abnormal finding of lung field: Secondary | ICD-10-CM

## 2020-04-13 HISTORY — DX: Atherosclerosis of aorta: I70.0

## 2020-04-13 LAB — BASIC METABOLIC PANEL
BUN: 16 mg/dL (ref 6–23)
CO2: 28 mEq/L (ref 19–32)
Calcium: 9.6 mg/dL (ref 8.4–10.5)
Chloride: 104 mEq/L (ref 96–112)
Creatinine, Ser: 1.02 mg/dL (ref 0.40–1.20)
GFR: 54.1 mL/min — ABNORMAL LOW (ref >60.00)
Glucose, Bld: 112 mg/dL — ABNORMAL HIGH (ref 70–99)
Potassium: 4 mEq/L (ref 3.5–5.1)
Sodium: 139 mEq/L (ref 135–145)

## 2020-04-13 LAB — HEPATIC FUNCTION PANEL
ALT: 16 U/L (ref 0–35)
AST: 16 U/L (ref 0–37)
Albumin: 4.4 g/dL (ref 3.5–5.2)
Alkaline Phosphatase: 50 U/L (ref 39–117)
Bilirubin, Direct: 0.2 mg/dL (ref 0.0–0.3)
Total Bilirubin: 0.7 mg/dL (ref 0.2–1.2)
Total Protein: 7.7 g/dL (ref 6.0–8.3)

## 2020-04-13 LAB — LIPID PANEL
Cholesterol: 155 mg/dL (ref 0–200)
HDL: 71.1 mg/dL (ref >39.00)
LDL Cholesterol: 65 mg/dL (ref 0–99)
NonHDL: 83.74
Total CHOL/HDL Ratio: 2
Triglycerides: 92 mg/dL (ref 0.0–149.0)
VLDL: 18.4 mg/dL (ref 0.0–40.0)

## 2020-04-13 LAB — HEMOGLOBIN A1C: Hgb A1c MFr Bld: 6.1 % (ref 4.6–6.5)

## 2020-04-13 NOTE — Patient Instructions (Signed)
You had the flu shot today  You will be contacted regarding the referral for: pulmonary  Please continue all other medications as before, and refills have been done if requested.  Please have the pharmacy call with any other refills you may need.  Please continue your efforts at being more active, low cholesterol diet, and weight control.  You are otherwise up to date with prevention measures today.  Please keep your appointments with your specialists as you may have planned  Please go to the LAB at the blood drawing area for the tests to be done  You will be contacted by phone if any changes need to be made immediately.  Otherwise, you will receive a letter about your results with an explanation, but please check with MyChart first.  Please remember to sign up for MyChart if you have not done so, as this will be important to you in the future with finding out test results, communicating by private email, and scheduling acute appointments online when needed.  Please make an Appointment to return in 6 months, or sooner if needed
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
## 2020-04-16 ENCOUNTER — Encounter: Payer: Self-pay | Admitting: Internal Medicine

## 2020-04-16 NOTE — Assessment & Plan Note (Signed)
stable overall by history and exam, recent data reviewed with pt, and pt to continue medical treatment as before,  to f/u any worsening symptoms or concerns  

## 2020-04-16 NOTE — Assessment & Plan Note (Signed)
To continue statin and modification of CRFs

## 2020-04-16 NOTE — Assessment & Plan Note (Signed)
Cant r/o malignancy, for pulm referral

## 2020-04-16 NOTE — Assessment & Plan Note (Addendum)
C/w msk, for d/c percocet, ok to hold acyclovir - no evidence of shingles, cont muscle relaxer prn  I spent 41 minutes in preparing to see the patient by review of recent labs, imaging and procedures, obtaining and reviewing separately obtained history, communicating with the patient and family or caregiver, ordering medications, tests or procedures, and documenting clinical information in the EHR including the differential Dx, treatment, and any further evaluation and other management of cp, aortic atheroscelrosis, cad, back pain, abnormal ct lung, hld, htn, dm

## 2020-04-16 NOTE — Assessment & Plan Note (Signed)
C/w msk - for muscle relaxer prn

## 2020-04-17 ENCOUNTER — Other Ambulatory Visit: Payer: Self-pay

## 2020-04-17 ENCOUNTER — Other Ambulatory Visit: Payer: Self-pay | Admitting: Internal Medicine

## 2020-04-17 ENCOUNTER — Encounter: Payer: Self-pay | Admitting: Internal Medicine

## 2020-04-17 NOTE — Telephone Encounter (Signed)
Please refill as per office routine med refill policy (all routine meds refilled for 3 mo or monthly per pt preference up to one year from last visit, then month to month grace period for 3 mo, then further med refills will have to be denied)  

## 2020-04-18 ENCOUNTER — Ambulatory Visit (INDEPENDENT_AMBULATORY_CARE_PROVIDER_SITE_OTHER): Payer: Medicare Other | Admitting: Physician Assistant

## 2020-04-18 ENCOUNTER — Other Ambulatory Visit: Payer: Self-pay

## 2020-04-18 ENCOUNTER — Telehealth: Payer: Self-pay | Admitting: Cardiovascular Disease

## 2020-04-18 VITALS — BP 142/84 | HR 89 | Ht 61.0 in | Wt 217.0 lb

## 2020-04-18 DIAGNOSIS — E119 Type 2 diabetes mellitus without complications: Secondary | ICD-10-CM | POA: Diagnosis not present

## 2020-04-18 DIAGNOSIS — Z952 Presence of prosthetic heart valve: Secondary | ICD-10-CM | POA: Diagnosis not present

## 2020-04-18 DIAGNOSIS — I6523 Occlusion and stenosis of bilateral carotid arteries: Secondary | ICD-10-CM

## 2020-04-18 DIAGNOSIS — E785 Hyperlipidemia, unspecified: Secondary | ICD-10-CM | POA: Diagnosis not present

## 2020-04-18 DIAGNOSIS — R079 Chest pain, unspecified: Secondary | ICD-10-CM

## 2020-04-18 DIAGNOSIS — I10 Essential (primary) hypertension: Secondary | ICD-10-CM

## 2020-04-18 MED ORDER — TIZANIDINE HCL 2 MG PO TABS: 2.0000 mg | ORAL_TABLET | Freq: Three times a day (TID) | ORAL | 1 refills | Status: DC | PRN

## 2020-04-18 NOTE — Telephone Encounter (Signed)
Pt c/o of Chest Pain: STAT if CP now or developed within 24 hours  1. Are you having CP right now? Yes.  2. Are you experiencing any other symptoms (ex. SOB, nausea, vomiting, sweating)? No.  3. How long have you been experiencing CP? Since 04/07/20  4. Is your CP continuous or coming and going? Continuous.  5. Have you taken Nitroglycerin? No.   Patient is calling stating that she is having chest pain and would like to make an appointment to be seen. Please advise. ?

## 2020-04-18 NOTE — Telephone Encounter (Signed)
Spoke with patient who says she is having pain all throughout her left upper back and in her chest. Pt was initially advised to go to the ER for chest pain. However, she states she has recently been evaluated there. She states this has been going on for a few days, and she has already been to the ER twice for it on 12/10 and 12/14. She has also seen her PCP recently as well. During these visits she states the providers were unable to tell her what is wrong and have been prescribing her pain medications. The pt states her pain is severe and uncontrolled by the medication. She denies shortness of breath, jaw/arm pain and nausea. Reports HR is in the 70s and BP is elevated recently in the 160s/80s-90s (she states she feels this is higher due to the pain). Pt is very worried and frustrated that she has no answers. Appointment made with Almyra Deforest, PA-C for today at 1:45 p.m. Patient verbalizes understanding and is grateful for the opportunity to be seen.
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
## 2020-04-18 NOTE — Patient Instructions (Signed)
Medication Instructions:  Your physician recommends that you continue on your current medications as directed. Please refer to the Current Medication list given to you today.  *If you need a refill on your cardiac medications before your next appointment, please call your pharmacy*  Lab Work: NONE ordered at this time of appointment   If you have labs (blood work) drawn today and your tests are completely normal, you will receive your results only by: Marland Kitchen MyChart Message (if you have MyChart) OR . A paper copy in the mail If you have any lab test that is abnormal or we need to change your treatment, we will call you to review the results.  Testing/Procedures: NONE ordered at this time of appointment   Follow-Up: At [facility], you and your health needs are our priority.  As part of our continuing mission to provide you with exceptional heart care, we have created designated Provider Care Teams.  These Care Teams include your primary Cardiologist (physician) and Advanced Practice Providers (APPs -  Physician Assistants and Nurse Practitioners) who all work together to provide you with the care you need, when you need it.  Your next appointment:   Follow up as scheduled    The format for your next appointment:   In Person  Provider:   Skeet Latch, MD  Other Instructions

## 2020-04-19 ENCOUNTER — Other Ambulatory Visit (HOSPITAL_COMMUNITY): Payer: Self-pay | Admitting: Neurosurgery

## 2020-04-19 ENCOUNTER — Ambulatory Visit (HOSPITAL_COMMUNITY)
Admission: RE | Admit: 2020-04-19 | Discharge: 2020-04-19 | Disposition: A | Discharge status: Home / Self Care | Payer: Medicare Other | Source: Ambulatory Visit | Attending: Vascular Surgery | Admitting: Vascular Surgery

## 2020-04-19 ENCOUNTER — Encounter (HOSPITAL_COMMUNITY): Payer: Medicare Other

## 2020-04-19 DIAGNOSIS — R252 Cramp and spasm: Secondary | ICD-10-CM

## 2020-04-19 NOTE — Telephone Encounter (Signed)
Thank you :)

## 2020-04-19 NOTE — Telephone Encounter (Signed)
/ ?   So she wants percocet ?  That would need to come from the original provider

## 2020-04-20 ENCOUNTER — Encounter: Payer: Self-pay | Admitting: Physician Assistant

## 2020-04-26 ENCOUNTER — Other Ambulatory Visit: Payer: Self-pay | Admitting: Internal Medicine

## 2020-04-26 NOTE — Telephone Encounter (Signed)
Please refill as per office routine med refill policy (all routine meds refilled for 3 mo or monthly per pt preference up to one year from last visit, then month to month grace period for 3 mo, then further med refills will have to be denied)  

## 2020-05-01 DIAGNOSIS — M5412 Radiculopathy, cervical region: Secondary | ICD-10-CM | POA: Diagnosis not present

## 2020-05-01 DIAGNOSIS — M25512 Pain in left shoulder: Secondary | ICD-10-CM | POA: Diagnosis not present

## 2020-05-04 DIAGNOSIS — I10 Essential (primary) hypertension: Secondary | ICD-10-CM | POA: Diagnosis not present

## 2020-05-04 DIAGNOSIS — M542 Cervicalgia: Secondary | ICD-10-CM | POA: Diagnosis not present

## 2020-05-04 DIAGNOSIS — Z6839 Body mass index (BMI) 39.0-39.9, adult: Secondary | ICD-10-CM | POA: Diagnosis not present

## 2020-05-05 ENCOUNTER — Other Ambulatory Visit: Payer: Self-pay | Admitting: Neurosurgery

## 2020-05-05 DIAGNOSIS — G5682 Other specified mononeuropathies of left upper limb: Secondary | ICD-10-CM

## 2020-05-08 DIAGNOSIS — E119 Type 2 diabetes mellitus without complications: Secondary | ICD-10-CM | POA: Diagnosis not present

## 2020-05-08 DIAGNOSIS — Z961 Presence of intraocular lens: Secondary | ICD-10-CM | POA: Diagnosis not present

## 2020-05-08 DIAGNOSIS — H04123 Dry eye syndrome of bilateral lacrimal glands: Secondary | ICD-10-CM | POA: Diagnosis not present

## 2020-05-08 DIAGNOSIS — M5412 Radiculopathy, cervical region: Secondary | ICD-10-CM | POA: Diagnosis not present

## 2020-05-09 ENCOUNTER — Other Ambulatory Visit: Payer: Self-pay

## 2020-05-09 ENCOUNTER — Encounter: Payer: Self-pay | Admitting: Emergency Medicine

## 2020-05-09 ENCOUNTER — Ambulatory Visit (INDEPENDENT_AMBULATORY_CARE_PROVIDER_SITE_OTHER): Payer: Medicare Other | Admitting: Emergency Medicine

## 2020-05-09 DIAGNOSIS — R918 Other nonspecific abnormal finding of lung field: Secondary | ICD-10-CM | POA: Diagnosis not present

## 2020-05-09 DIAGNOSIS — J45909 Unspecified asthma, uncomplicated: Secondary | ICD-10-CM

## 2020-05-09 NOTE — Addendum Note (Signed)
Addended by: Gavin Potters R on: 05/09/2020 02:52 PM   Modules accepted: Orders

## 2020-05-09 NOTE — Assessment & Plan Note (Addendum)
Subtle area of groundglass in the left upper lobe peribronchovascular region, no overt nodularity, etiology unclear, consider inflammatory as she does have a history of eczema. Does not look like an air trapping pattern. The groundglass area deserves appropriate follow-up, repeat imaging. If there is an interval change, growth, change in solid component then would recommend obtaining a tissue diagnosis, likely via navigational bronchoscopy. Discussed this with her today. We will plan to repeat her CT scan in June to assess for interval stability.

## 2020-05-09 NOTE — Patient Instructions (Signed)
We will plan to repeat your CT scan of the chest without contrast in June 2022 to follow pulmonary infiltrate Continue your Symbicort 2 puffs twice a day. Rinse and gargle after using. Keep your albuterol available use 2 puffs when you needed for shortness of breath, chest tightness, wheezing. Follow Dr. Lamonte Sakai in 6 months after your CT scan so that we can review the results together.

## 2020-05-09 NOTE — Progress Notes (Signed)
Subjective:    Patient ID: Denise [name], female    DOB: 05-Feb-1946, 75 y.o.   MRN: [id number]  HPI 75 year old former smoker (3 pack years) with a history of aortic stenosis and TAVR 2018, history of right breast cancer with XRT, diabetes, hyperlipidemia, eczema, hypertension.  She carries a diagnosis of asthma that was made in the 80's. She is on Symbicort (recently changed from Advair), has albuterol that she uses rarely. She has mucous that she has to clear daily, flares with URI's.   She is referred today for abnormal CT scan of the chest. This was done for some L sided CP.    CT-PA 04/07/2020 reviewed by me, shows some subtle peribronchovascular left upper lobe groundglass without an overt nodule, stable left upper lobe linear atelectasis compared with 12/10/2017  PFT 08/29/2016 reviewed by me, show normal baseline airflows but with a positive bronchodilator response consistent with mild obstruction, normal volumes, normal diffusion capacity.   Review of Systems As per HPI  Past Medical History:  Diagnosis Date  . Abdominal pain, left lower quadrant 09/12/2008  . ALLERGIC RHINITIS 08/24/2007  . Allergy   . Anemia    past hx- not current   . ANXIETY 08/24/2007  . Aortic stenosis, severe 07/19/2016  . ASTHMA 08/24/2007  . ASTHMA, WITH ACUTE EXACERBATION 03/14/2008  . Breast cancer ([place])    right- radiation only   . Cataract    removed bilat   . DDD (degenerative disc disease), lumbar   . DEGENERATIVE JOINT DISEASE 08/24/2007  . DEPRESSION 08/24/2007  . DIABETES MELLITUS, TYPE II 08/24/2007  . ECZEMA 08/24/2007  . Edema 08/24/2007  . GERD 08/24/2007   not current (07/2014)- not current 2019  . Heart murmur   . HYPERCHOLESTEROLEMIA 08/24/2007  . HYPERLIPIDEMIA 08/24/2007  . HYPERTENSION 08/24/2007  . Neuromuscular disorder (HCC)    numbness in feet - still has feeling  . OBESITY 08/24/2007  . 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 Personal history of radiation  therapy 2017  . POSTMENOPAUSAL STATUS 08/24/2007  . 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 SPINAL STENOSIS 08/24/2007     Family History  Problem Relation Age of Onset  . Heart attack Mother   . Diabetes Other   . Hypertension Other   . Stroke Other   . Colon polyps Other   . Colon cancer Neg Hx   . Esophageal cancer Neg Hx   . Rectal cancer Neg Hx   . Stomach cancer Neg Hx      Social History   Socioeconomic History  . Marital status: Single    Spouse name: Not on file  . Number of children: 2  . Years of education: Not on file  . Highest education level: Not on file  Occupational History  . 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 Smoking status: Former Smoker    Packs/day: 0.50    Years: 2.00    Pack years: 1.00    Quit date: 1971    Years since quitting: 51.0  . Smokeless tobacco: Never Used  Vaping Use  . Vaping Use: Never used  Substance and Sexual Activity  . Alcohol use: No    Comment: rare  . Drug use: No  . Sexual activity: Not on file  Other Topics Concern  .  Not on file  Social History Narrative  . Not on file   Social Determinants of Health   Financial Resource Strain: Not on file  Food Insecurity: Not on file  Transportation Needs: Not on file  Physical Activity: Not on file  Stress: Not on file  Social Connections: Not on file  Intimate Partner Violence: Not on file    She is an Therapist, sports, still works home health.  She is from has lived in [place], [place], [place] Two dogs, never birds.    Allergies  Allergen Reactions  . Doxazosin Nausea And Vomiting    Dizziness   . Clonidine Hydrochloride Other (See Comments)    Bradycardia  . Erythromycin Palpitations  . Hydrocodone-Acetaminophen Nausea Only  . Pork-Derived Products Diarrhea and Nausea Only  . Rosiglitazone  Maleate Swelling    SWELLING REACTION UNSPECIFIED      Outpatient Medications Prior to Visit  Medication Sig Dispense Refill  . acyclovir (ZOVIRAX) 400 MG tablet Take 1 tablet (400 mg total) by mouth 4 (four) times daily. 40 tablet 0  . albuterol (VENTOLIN HFA) 108 (90 Base) MCG/ACT inhaler Inhale 2 puffs into the lungs every 6 (six) hours as needed for wheezing or shortness of breath. 8.5 g 1  . amLODipine (NORVASC) 10 MG tablet Take 1 tablet by mouth once daily 30 tablet 0  . anastrozole (ARIMIDEX) 1 MG tablet Take 1 tablet (1 mg total) by mouth daily. 90 tablet 3  . aspirin 81 MG EC tablet Take 81 mg by mouth daily.    Marland Kitchen atorvastatin (LIPITOR) 40 MG tablet TAKE 1 TABLET BY MOUTH ONCE DAILY ANNUAL  APPOINTMENT  DUE  IN  [place],  MUST  SEE  PROVIDER  FOR  FUTURE  REFILLS (Patient taking differently: Take 40 mg by mouth daily.) 90 tablet 0  . budesonide-formoterol (SYMBICORT) 160-4.5 MCG/ACT inhaler Inhale 2 puffs by mouth twice daily 33 g 0  . Calcium-Magnesium-Vitamin D (CALCIUM 1200+D3 PO) Take 1 tablet by mouth daily.    . cetirizine (EQ ALLERGY RELIEF, CETIRIZINE,) 10 MG tablet TAKE 1 TABLET BY MOUTH ONCE DAILY AS NEEDED FOR  ALLERGIES (Patient taking differently: Take 10 mg by mouth daily as needed for allergies.) 90 tablet 3  . Cholecalciferol (VITAMIN D-3) 1000 units CAPS Take 1,000 Units by mouth daily.    . diclofenac Sodium (VOLTAREN) 1 % GEL Apply 4 g topically 4 (four) times daily. 100 g 0  . fluticasone (FLONASE) 50 MCG/ACT nasal spray USE 2 SPRAYS INTO BOTH NOSTRILS DAILY (Patient taking differently: Place 2 sprays into both nostrils daily.) 16 g 5  . furosemide (LASIX) 40 MG tablet Take 1 tablet by mouth twice daily 180 tablet 0  . lidocaine (LIDODERM) 5 % Place 1 patch onto the skin daily. Remove & Discard patch within 12 hours or as directed by MD 30 patch 0  . meclizine (ANTIVERT) 25 MG tablet Take 1 tablet (25 mg total) by mouth 3 (three) times daily as needed for dizziness. 30  tablet 0  . metFORMIN (GLUCOPHAGE) 500 MG tablet TAKE 2 TABLETS BY MOUTH TWICE DAILY WITH A MEAL 360 tablet 0  . Multiple Vitamins-Minerals (ALIVE WOMENS 50+) TABS Take 1 tablet by mouth daily.    . Omega-3 Fatty Acids (FISH OIL) 1000 MG CAPS Take 1,000 mg by mouth daily.    . ondansetron (ZOFRAN ODT) 4 MG disintegrating tablet Take 1 tablet (4 mg total) by mouth every 8 (eight) hours as needed for nausea. 10 tablet 0  .  oxyCODONE-acetaminophen (PERCOCET/ROXICET) 5-325 MG tablet Take 1 tablet by mouth every 6 (six) hours as needed for severe pain. 8 tablet 0  . potassium chloride (MICRO-K) 10 MEQ CR capsule TAKE 4 CAPSULES BY MOUTH ONCE DAILY 360 capsule 0  . tiZANidine (ZANAFLEX) 2 MG tablet Take 1 tablet (2 mg total) by mouth 3 (three) times daily as needed for muscle spasms. 40 tablet 1  . valsartan (DIOVAN) 40 MG tablet Take 40 mg by mouth as directed. Take 1/2 tablet daily     Facility-Administered Medications Prior to Visit  Medication Dose Route Frequency Provider Last Rate Last Admin  . albuterol (PROVENTIL) (2.5 MG/3ML) 0.083% nebulizer solution 2.5 mg  2.5 mg Nebulization Once Marrian Salvage, FNP             Objective:   Physical Exam Vitals:   05/09/20 1401  BP: (!) 144/80  Pulse: 83  Temp: 98.1 F (36.7 C)  SpO2: 97%  Weight: 219 lb 6.4 oz (99.5 kg)  Height: 5\' 1"  (1.549 m)   Gen: Pleasant, overweight woman, in no distress,  normal affect  ENT: No lesions,  mouth clear,  oropharynx clear, no postnasal drip  Neck: No JVD, no stridor  Lungs: No use of accessory muscles, no crackles or wheezing on normal respiration, no wheeze on forced expiration  Cardiovascular: RRR, soft early systolic murmur with intact S2  Musculoskeletal: No deformities, no cyanosis or clubbing  Neuro: alert, awake, non focal  Skin: Warm, no lesions or rash       Assessment & Plan:  Abnormal CT scan of lung Subtle area of groundglass in the left upper lobe peribronchovascular  region, no overt nodularity, etiology unclear, consider inflammatory as she does have a history of eczema. Does not look like an air trapping pattern. The groundglass area deserves appropriate follow-up, repeat imaging. If there is an interval change, growth, change in solid component then would recommend obtaining a tissue diagnosis, likely via navigational bronchoscopy. Discussed this with her today. We will plan to repeat her CT scan in June to assess for interval stability.  Asthma Overall stable. Has a bronchitic phenotype with symptoms most days including cough, mucus production. She is on Symbicort, rarely needs albuterol.  Baltazar Apo, MD, PhD 05/09/2020, 2:35 PM [facility] [phone] or if no answer [phone]

## 2020-05-09 NOTE — Assessment & Plan Note (Signed)
Overall stable. Has a bronchitic phenotype with symptoms most days including cough, mucus production. She is on Symbicort, rarely needs albuterol.

## 2020-05-18 ENCOUNTER — Other Ambulatory Visit: Payer: Self-pay | Admitting: Internal Medicine

## 2020-05-18 ENCOUNTER — Other Ambulatory Visit: Payer: Self-pay | Admitting: General Surgery

## 2020-05-18 DIAGNOSIS — Z853 Personal history of malignant neoplasm of breast: Secondary | ICD-10-CM

## 2020-05-18 NOTE — Telephone Encounter (Signed)
Please refill as per office routine med refill policy (all routine meds refilled for 3 mo or monthly per pt preference up to one year from last visit, then month to month grace period for 3 mo, then further med refills will have to be denied)  

## 2020-05-19 ENCOUNTER — Other Ambulatory Visit: Payer: Self-pay | Admitting: Hematology and Oncology

## 2020-05-19 DIAGNOSIS — Z853 Personal history of malignant neoplasm of breast: Secondary | ICD-10-CM

## 2020-05-23 ENCOUNTER — Ambulatory Visit
Admission: RE | Admit: 2020-05-23 | Discharge: 2020-05-23 | Disposition: A | Discharge status: Home / Self Care | Payer: Medicare Other | Source: Ambulatory Visit | Attending: General Surgery | Admitting: General Surgery

## 2020-05-23 ENCOUNTER — Other Ambulatory Visit: Payer: Self-pay

## 2020-05-23 DIAGNOSIS — Z853 Personal history of malignant neoplasm of breast: Secondary | ICD-10-CM

## 2020-05-24 ENCOUNTER — Ambulatory Visit
Admission: RE | Admit: 2020-05-24 | Discharge: 2020-05-24 | Disposition: A | Discharge status: Home / Self Care | Payer: Medicare Other | Source: Ambulatory Visit | Attending: Neurosurgery | Admitting: Neurosurgery

## 2020-05-24 DIAGNOSIS — M65812 Other synovitis and tenosynovitis, left shoulder: Secondary | ICD-10-CM | POA: Diagnosis not present

## 2020-05-24 DIAGNOSIS — R600 Localized edema: Secondary | ICD-10-CM | POA: Diagnosis not present

## 2020-05-24 DIAGNOSIS — G5682 Other specified mononeuropathies of left upper limb: Secondary | ICD-10-CM

## 2020-05-24 DIAGNOSIS — M7552 Bursitis of left shoulder: Secondary | ICD-10-CM | POA: Diagnosis not present

## 2020-05-24 DIAGNOSIS — M19012 Primary osteoarthritis, left shoulder: Secondary | ICD-10-CM | POA: Diagnosis not present

## 2020-05-25 DIAGNOSIS — I10 Essential (primary) hypertension: Secondary | ICD-10-CM | POA: Diagnosis not present

## 2020-05-25 DIAGNOSIS — Z6839 Body mass index (BMI) 39.0-39.9, adult: Secondary | ICD-10-CM | POA: Diagnosis not present

## 2020-05-25 DIAGNOSIS — G5682 Other specified mononeuropathies of left upper limb: Secondary | ICD-10-CM | POA: Diagnosis not present

## 2020-05-29 DIAGNOSIS — M25512 Pain in left shoulder: Secondary | ICD-10-CM | POA: Diagnosis not present

## 2020-05-29 DIAGNOSIS — M5412 Radiculopathy, cervical region: Secondary | ICD-10-CM | POA: Diagnosis not present

## 2020-05-30 DIAGNOSIS — M5412 Radiculopathy, cervical region: Secondary | ICD-10-CM | POA: Diagnosis not present

## 2020-06-06 DIAGNOSIS — M5412 Radiculopathy, cervical region: Secondary | ICD-10-CM | POA: Diagnosis not present

## 2020-06-08 DIAGNOSIS — M25512 Pain in left shoulder: Secondary | ICD-10-CM | POA: Diagnosis not present

## 2020-06-15 DIAGNOSIS — M5412 Radiculopathy, cervical region: Secondary | ICD-10-CM | POA: Diagnosis not present

## 2020-06-21 ENCOUNTER — Other Ambulatory Visit: Payer: Self-pay | Admitting: Internal Medicine

## 2020-06-21 NOTE — Telephone Encounter (Signed)
Please refill as per office routine med refill policy (all routine meds refilled for 3 mo or monthly per pt preference up to one year from last visit, then month to month grace period for 3 mo, then further med refills will have to be denied)  

## 2020-06-30 DIAGNOSIS — M5412 Radiculopathy, cervical region: Secondary | ICD-10-CM | POA: Diagnosis not present
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
## 2020-07-25 ENCOUNTER — Ambulatory Visit: Payer: Medicare Other | Admitting: Cardiovascular Disease

## 2020-07-26 ENCOUNTER — Encounter: Payer: Self-pay | Admitting: Internal Medicine

## 2020-07-26 ENCOUNTER — Other Ambulatory Visit: Payer: Self-pay

## 2020-07-26 ENCOUNTER — Other Ambulatory Visit: Payer: Self-pay | Admitting: Internal Medicine

## 2020-07-26 ENCOUNTER — Ambulatory Visit (INDEPENDENT_AMBULATORY_CARE_PROVIDER_SITE_OTHER): Payer: Medicare Other | Admitting: Internal Medicine

## 2020-07-26 VITALS — BP 130/78 | HR 83 | Wt 220.0 lb

## 2020-07-26 DIAGNOSIS — J309 Allergic rhinitis, unspecified: Secondary | ICD-10-CM | POA: Diagnosis not present

## 2020-07-26 DIAGNOSIS — F4321 Adjustment disorder with depressed mood: Secondary | ICD-10-CM | POA: Diagnosis not present

## 2020-07-26 DIAGNOSIS — J019 Acute sinusitis, unspecified: Secondary | ICD-10-CM

## 2020-07-26 DIAGNOSIS — J302 Other seasonal allergic rhinitis: Secondary | ICD-10-CM

## 2020-07-26 DIAGNOSIS — J45909 Unspecified asthma, uncomplicated: Secondary | ICD-10-CM

## 2020-07-26 MED ORDER — POTASSIUM CHLORIDE ER 10 MEQ PO CPCR: ORAL_CAPSULE | ORAL | 3 refills | Status: DC

## 2020-07-26 MED ORDER — BUDESONIDE-FORMOTEROL FUMARATE 160-4.5 MCG/ACT IN AERO
2.0000 | INHALATION_SPRAY | Freq: Two times a day (BID) | RESPIRATORY_TRACT | 3 refills | Status: DC
Start: 2020-07-26 — End: 2022-01-09

## 2020-07-26 MED ORDER — VALSARTAN 40 MG PO TABS
20.0000 mg | ORAL_TABLET | ORAL | 3 refills | Status: DC
Start: 2020-07-26 — End: 2021-04-25

## 2020-07-26 MED ORDER — METHYLPREDNISOLONE ACETATE 80 MG/ML IJ SUSP
80.0000 mg | Freq: Once | INTRAMUSCULAR | Status: AC
  Administered 2020-07-26: 80 mg via INTRAMUSCULAR

## 2020-07-26 MED ORDER — ATORVASTATIN CALCIUM 40 MG PO TABS: 40.0000 mg | ORAL_TABLET | Freq: Every day | ORAL | 3 refills | Status: DC

## 2020-07-26 MED ORDER — METFORMIN HCL 500 MG PO TABS: ORAL_TABLET | ORAL | 3 refills | Status: DC

## 2020-07-26 MED ORDER — ALBUTEROL SULFATE HFA 108 (90 BASE) MCG/ACT IN AERS: 2.0000 | INHALATION_SPRAY | Freq: Four times a day (QID) | RESPIRATORY_TRACT | 1 refills | Status: DC | PRN

## 2020-07-26 MED ORDER — PREDNISONE 10 MG PO TABS: ORAL_TABLET | ORAL | 0 refills | Status: DC

## 2020-07-26 MED ORDER — AMLODIPINE BESYLATE 10 MG PO TABS: 10.0000 mg | ORAL_TABLET | Freq: Every day | ORAL | 3 refills | Status: DC

## 2020-07-26 MED ORDER — AZITHROMYCIN 250 MG PO TABS: ORAL_TABLET | ORAL | 0 refills | Status: DC

## 2020-07-26 MED ORDER — FUROSEMIDE 40 MG PO TABS: 40.0000 mg | ORAL_TABLET | Freq: Two times a day (BID) | ORAL | 3 refills | Status: DC

## 2020-07-26 MED ORDER — FLUTICASONE PROPIONATE 50 MCG/ACT NA SUSP: 2.0000 | Freq: Every day | NASAL | 11 refills | Status: DC

## 2020-07-26 NOTE — Patient Instructions (Addendum)
You had the steroid shot today  Please take all new medication as prescribed - the antibiotic, and prednisone  Please continue all other medications as before, and refills have been done if requested.  Please have the pharmacy call with any other refills you may need.  Please continue your efforts at being more active, low cholesterol diet, and weight control.  You are otherwise up to date with prevention measures today.  Please keep your appointments with your specialists as you may have planned  Please make an Appointment to return in 4 months, or sooner if needed

## 2020-07-26 NOTE — Telephone Encounter (Signed)
Please refill as per office routine med refill policy (all routine meds refilled for 3 mo or monthly per pt preference up to one year from last visit, then month to month grace period for 3 mo, then further med refills will have to be denied)  
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
## 2020-07-30 ENCOUNTER — Encounter: Payer: Self-pay | Admitting: Internal Medicine

## 2020-07-30 DIAGNOSIS — F4321 Adjustment disorder with depressed mood: Secondary | ICD-10-CM | POA: Insufficient documentation

## 2020-07-30 DIAGNOSIS — F4320 Adjustment disorder, unspecified: Secondary | ICD-10-CM | POA: Insufficient documentation

## 2020-07-30 NOTE — Assessment & Plan Note (Signed)
Mild to mod, for antibx course,  to f/u any worsening symptoms or concerns 

## 2020-07-30 NOTE — Assessment & Plan Note (Signed)
Mild to mod, for prednisone asd, to f/u any worsening symptoms or concerns

## 2020-07-30 NOTE — Assessment & Plan Note (Signed)
D/w pt, declines referral for counseling at this time

## 2020-07-30 NOTE — Assessment & Plan Note (Signed)
Stable, continue albuterol inhaler prn

## 2020-08-05 ENCOUNTER — Encounter: Payer: Self-pay | Admitting: Internal Medicine

## 2020-08-09 NOTE — Telephone Encounter (Signed)
Patient called and said that she is still having a sore throat, right ear is clogged and swollen glands. She said that the z pack has not helped much. She can be reached at [phone]. Please advise

## 2020-08-10 NOTE — Telephone Encounter (Signed)
We can try a different med like doxycycline or I could refer her to ENT, ok to ask pt and let me know please

## 2020-08-15 ENCOUNTER — Telehealth: Payer: Self-pay

## 2020-08-15 NOTE — Telephone Encounter (Signed)
Voicemail left to follow up with patient

## 2020-09-04 DIAGNOSIS — M79645 Pain in left finger(s): Secondary | ICD-10-CM | POA: Diagnosis not present

## 2020-09-04 DIAGNOSIS — M1812 Unilateral primary osteoarthritis of first carpometacarpal joint, left hand: Secondary | ICD-10-CM | POA: Diagnosis not present

## 2020-09-05 DIAGNOSIS — M5440 Lumbago with sciatica, unspecified side: Secondary | ICD-10-CM | POA: Diagnosis not present

## 2020-09-06 ENCOUNTER — Other Ambulatory Visit: Payer: Self-pay | Admitting: Neurosurgery

## 2020-09-06 DIAGNOSIS — M5440 Lumbago with sciatica, unspecified side: Secondary | ICD-10-CM

## 2020-09-15 ENCOUNTER — Encounter: Payer: Self-pay | Admitting: Internal Medicine

## 2020-09-20 ENCOUNTER — Encounter: Payer: Self-pay | Admitting: Internal Medicine

## 2020-09-21 NOTE — Telephone Encounter (Signed)
Actually I really cant tell from this limited information; we should probably see her in the office, as routine GI referrals are now taking often over 1 month

## 2020-09-22 ENCOUNTER — Ambulatory Visit
Admission: RE | Admit: 2020-09-22 | Discharge: 2020-09-22 | Disposition: A | Discharge status: Home / Self Care | Payer: Medicare Other | Source: Ambulatory Visit | Attending: Neurosurgery | Admitting: Neurosurgery

## 2020-09-22 ENCOUNTER — Other Ambulatory Visit: Payer: Self-pay

## 2020-09-22 DIAGNOSIS — M48061 Spinal stenosis, lumbar region without neurogenic claudication: Secondary | ICD-10-CM | POA: Diagnosis not present

## 2020-09-22 DIAGNOSIS — M545 Low back pain, unspecified: Secondary | ICD-10-CM | POA: Diagnosis not present

## 2020-09-22 DIAGNOSIS — M5440 Lumbago with sciatica, unspecified side: Secondary | ICD-10-CM

## 2020-09-28 DIAGNOSIS — M5416 Radiculopathy, lumbar region: Secondary | ICD-10-CM | POA: Diagnosis not present

## 2020-09-28 NOTE — Telephone Encounter (Signed)
Patient has appointment 10/12/20.

## 2020-10-02 ENCOUNTER — Encounter: Payer: Self-pay | Admitting: Internal Medicine

## 2020-10-03 DIAGNOSIS — M5440 Lumbago with sciatica, unspecified side: Secondary | ICD-10-CM | POA: Diagnosis not present

## 2020-10-03 DIAGNOSIS — I10 Essential (primary) hypertension: Secondary | ICD-10-CM | POA: Diagnosis not present

## 2020-10-03 DIAGNOSIS — Z6841 Body Mass Index (BMI) 40.0 and over, adult: Secondary | ICD-10-CM | POA: Diagnosis not present

## 2020-10-03 DIAGNOSIS — M5416 Radiculopathy, lumbar region: Secondary | ICD-10-CM | POA: Diagnosis not present

## 2020-10-04 ENCOUNTER — Telehealth: Payer: Self-pay | Admitting: Cardiovascular Disease

## 2020-10-04 NOTE — Telephone Encounter (Signed)
Spoke to patient she stated she has noticed she bruises easy.She has a new bruise on right and left forearms.She doesn't remember hitting her arm.Patient reassured.Stated she has not seen Dr.Kearny in a long time she would like to schedule appointment sooner.Advised Dr.Deary's schedule is full.Advised to keep appointment already scheduled 11/10/20 at 9:00 am.Advised I will send message to Dr.Gumlog's nurse to call you if they get a cancellation.

## 2020-10-04 NOTE — Telephone Encounter (Signed)
PT is calling with concerns she is having some unexpected bruising.PT has an appt scheduled for 7/15 but she feels she needs to be seen sooner.Advised next available is 9/9 she requested a phone not be sent to see when she can be worked in at.

## 2020-10-09 ENCOUNTER — Other Ambulatory Visit: Payer: Self-pay

## 2020-10-09 ENCOUNTER — Ambulatory Visit
Admission: RE | Admit: 2020-10-09 | Discharge: 2020-10-09 | Disposition: A | Discharge status: Home / Self Care | Payer: Medicare Other | Source: Ambulatory Visit | Attending: Emergency Medicine | Admitting: Emergency Medicine

## 2020-10-09 DIAGNOSIS — R918 Other nonspecific abnormal finding of lung field: Secondary | ICD-10-CM

## 2020-10-09 DIAGNOSIS — I700 Atherosclerosis of aorta: Secondary | ICD-10-CM | POA: Diagnosis not present

## 2020-10-09 DIAGNOSIS — I2510 Atherosclerotic heart disease of native coronary artery without angina pectoris: Secondary | ICD-10-CM | POA: Diagnosis not present

## 2020-10-09 DIAGNOSIS — J9859 Other diseases of mediastinum, not elsewhere classified: Secondary | ICD-10-CM | POA: Diagnosis not present

## 2020-10-09 DIAGNOSIS — Z853 Personal history of malignant neoplasm of breast: Secondary | ICD-10-CM | POA: Diagnosis not present

## 2020-10-10 ENCOUNTER — Encounter (HOSPITAL_BASED_OUTPATIENT_CLINIC_OR_DEPARTMENT_OTHER): Payer: Self-pay

## 2020-10-10 ENCOUNTER — Encounter: Payer: Self-pay | Admitting: Internal Medicine

## 2020-10-10 DIAGNOSIS — M25512 Pain in left shoulder: Secondary | ICD-10-CM | POA: Diagnosis not present

## 2020-10-10 DIAGNOSIS — M542 Cervicalgia: Secondary | ICD-10-CM | POA: Diagnosis not present

## 2020-10-11 ENCOUNTER — Other Ambulatory Visit: Payer: Self-pay

## 2020-10-11 ENCOUNTER — Telehealth: Payer: Self-pay | Admitting: Hematology and Oncology

## 2020-10-11 NOTE — Telephone Encounter (Signed)
R/s appt per 6/15 sch msg. Pt called in to move her appt up to the next available date. Moved appt per pt request.

## 2020-10-11 NOTE — Telephone Encounter (Signed)
Called pt to r/s appt per 6/15 sch msg. No answer. Left msg for pt to call back to r/s.

## 2020-10-12 ENCOUNTER — Encounter: Payer: Self-pay | Admitting: Emergency Medicine

## 2020-10-12 ENCOUNTER — Encounter: Payer: Self-pay | Admitting: Internal Medicine

## 2020-10-12 ENCOUNTER — Ambulatory Visit (INDEPENDENT_AMBULATORY_CARE_PROVIDER_SITE_OTHER): Payer: Medicare Other | Admitting: Internal Medicine

## 2020-10-12 ENCOUNTER — Ambulatory Visit (INDEPENDENT_AMBULATORY_CARE_PROVIDER_SITE_OTHER): Payer: Medicare Other | Admitting: Emergency Medicine

## 2020-10-12 ENCOUNTER — Other Ambulatory Visit: Payer: Self-pay

## 2020-10-12 VITALS — BP 142/80 | HR 67 | Temp 98.2°F

## 2020-10-12 DIAGNOSIS — E7800 Pure hypercholesterolemia, unspecified: Secondary | ICD-10-CM | POA: Diagnosis not present

## 2020-10-12 DIAGNOSIS — J45909 Unspecified asthma, uncomplicated: Secondary | ICD-10-CM | POA: Diagnosis not present

## 2020-10-12 DIAGNOSIS — E559 Vitamin D deficiency, unspecified: Secondary | ICD-10-CM

## 2020-10-12 DIAGNOSIS — E119 Type 2 diabetes mellitus without complications: Secondary | ICD-10-CM

## 2020-10-12 DIAGNOSIS — R918 Other nonspecific abnormal finding of lung field: Secondary | ICD-10-CM | POA: Diagnosis not present

## 2020-10-12 DIAGNOSIS — E0789 Other specified disorders of thyroid: Secondary | ICD-10-CM

## 2020-10-12 DIAGNOSIS — I10 Essential (primary) hypertension: Secondary | ICD-10-CM

## 2020-10-12 DIAGNOSIS — E538 Deficiency of other specified B group vitamins: Secondary | ICD-10-CM

## 2020-10-12 DIAGNOSIS — I700 Atherosclerosis of aorta: Secondary | ICD-10-CM | POA: Diagnosis not present

## 2020-10-12 LAB — URINALYSIS, ROUTINE W REFLEX MICROSCOPIC
Hgb urine dipstick: NEGATIVE
Ketones, ur: NEGATIVE
Nitrite: NEGATIVE
RBC / HPF: NONE SEEN (ref 0–?)
Specific Gravity, Urine: 1.025 (ref 1.000–1.030)
Total Protein, Urine: NEGATIVE
Urine Glucose: NEGATIVE
Urobilinogen, UA: 0.2 (ref 0.0–1.0)
pH: 6 (ref 5.0–8.0)

## 2020-10-12 LAB — CBC WITH DIFFERENTIAL/PLATELET
Basophils Absolute: 0.1 10*3/uL (ref 0.0–0.1)
Basophils Relative: 0.7 % (ref 0.0–3.0)
Eosinophils Absolute: 0.2 10*3/uL (ref 0.0–0.7)
Eosinophils Relative: 1.9 % (ref 0.0–5.0)
HCT: 39.3 % (ref 36.0–46.0)
Hemoglobin: 13.1 g/dL (ref 12.0–15.0)
Lymphocytes Relative: 32 % (ref 12.0–46.0)
Lymphs Abs: 3.2 10*3/uL (ref 0.7–4.0)
MCHC: 33.5 g/dL (ref 30.0–36.0)
MCV: 81.7 fl (ref 78.0–100.0)
Monocytes Absolute: 0.7 10*3/uL (ref 0.1–1.0)
Monocytes Relative: 7.3 % (ref 3.0–12.0)
Neutro Abs: 5.9 10*3/uL (ref 1.4–7.7)
Neutrophils Relative %: 58.1 % (ref 43.0–77.0)
Platelets: 201 10*3/uL (ref 150.0–400.0)
RBC: 4.8 Mil/uL (ref 3.87–5.11)
RDW: 15.7 % — ABNORMAL HIGH (ref 11.5–15.5)
WBC: 10.2 10*3/uL (ref 4.0–10.5)

## 2020-10-12 LAB — BASIC METABOLIC PANEL
BUN: 18 mg/dL (ref 6–23)
CO2: 29 mEq/L (ref 19–32)
Calcium: 9.7 mg/dL (ref 8.4–10.5)
Chloride: 103 mEq/L (ref 96–112)
Creatinine, Ser: 0.84 mg/dL (ref 0.40–1.20)
GFR: 68.06 mL/min (ref >60.00)
Glucose, Bld: 96 mg/dL (ref 70–99)
Potassium: 3.5 mEq/L (ref 3.5–5.1)
Sodium: 141 mEq/L (ref 135–145)

## 2020-10-12 LAB — LIPID PANEL
Cholesterol: 177 mg/dL (ref 0–200)
HDL: 84.6 mg/dL (ref >39.00)
LDL Cholesterol: 77 mg/dL (ref 0–99)
NonHDL: 92.71
Total CHOL/HDL Ratio: 2
Triglycerides: 77 mg/dL (ref 0.0–149.0)
VLDL: 15.4 mg/dL (ref 0.0–40.0)

## 2020-10-12 LAB — MICROALBUMIN / CREATININE URINE RATIO
Creatinine,U: 275.7 mg/dL
Microalb Creat Ratio: 0.8 mg/g (ref 0.0–30.0)
Microalb, Ur: 2.1 mg/dL — ABNORMAL HIGH (ref 0.0–1.9)

## 2020-10-12 LAB — HEPATIC FUNCTION PANEL
ALT: 25 U/L (ref 0–35)
AST: 26 U/L (ref 0–37)
Albumin: 4.6 g/dL (ref 3.5–5.2)
Alkaline Phosphatase: 51 U/L (ref 39–117)
Bilirubin, Direct: 0.1 mg/dL (ref 0.0–0.3)
Total Bilirubin: 0.8 mg/dL (ref 0.2–1.2)
Total Protein: 7.7 g/dL (ref 6.0–8.3)

## 2020-10-12 LAB — VITAMIN B12: Vitamin B-12: 291 pg/mL (ref 211–911)

## 2020-10-12 LAB — HEMOGLOBIN A1C: Hgb A1c MFr Bld: 6.4 % (ref 4.6–6.5)

## 2020-10-12 LAB — VITAMIN D 25 HYDROXY (VIT D DEFICIENCY, FRACTURES): VITD: 49.28 ng/mL (ref 30.00–100.00)

## 2020-10-12 LAB — T4, FREE: Free T4: 0.83 ng/dL (ref 0.60–1.60)

## 2020-10-12 LAB — TSH: TSH: 0.85 u[IU]/mL (ref 0.35–4.50)

## 2020-10-12 NOTE — Assessment & Plan Note (Signed)
Agree that you may need to have a repeat ultrasound of your thyroid gland. Follow with Dr Jenny Reichmann as planned.

## 2020-10-12 NOTE — Progress Notes (Signed)
Subjective:    Patient ID: Denise [name], female    DOB: 1945/12/06, 75 y.o.   MRN: [id number]  HPI 75 year old former smoker (3 pack years) with a history of aortic stenosis and TAVR 2018, history of right breast cancer with XRT, diabetes, hyperlipidemia, eczema, hypertension.  She carries a diagnosis of asthma that was made in the 80's. She is on Symbicort (recently changed from Advair), has albuterol that she uses rarely. She has mucous that she has to clear daily, flares with URI's.   She is referred today for abnormal CT scan of the chest. This was done for some L sided CP.    CT-PA 04/07/2020 reviewed by me, shows some subtle peribronchovascular left upper lobe groundglass without an overt nodule, stable left upper lobe linear atelectasis compared with 12/10/2017  PFT 08/29/2016 reviewed by me, show normal baseline airflows but with a positive bronchodilator response consistent with mild obstruction, normal volumes, normal diffusion capacity.  ROV 10/12/20 --75 year old woman with a minimal tobacco history, history of breast cancer.  Also diabetes, hyperlipidemia, eczema, hypertension, aortic stenosis with TAVR, asthma on Symbicort.  She had a CT chest performed in December that showed some subtle peribronchovascular left upper lobe groundglass without an overt nodule and some stable left upper lobe linear atelectasis. We repeated her CT chest to follow GGI nodule.  She is having some exertional SOB, no wheeze or cough. Rarely needs albuterol, may need it w URI's. She had a URI since last time, had to be treated for an acute flare. She averages 2x a year.   CT chest 10/09/2020 reviewed by me, shows no mediastinal or hilar adenopathy.  The thyroid is asymmetric with right lobe enlargement and a masslike appearance, partially calcified 4.2 x 3.2 cm.  There are no suspicious appearing nodules or masses in the lungs.   Review of Systems As per HPI       Objective:   Physical  Exam Vitals:   10/12/20 1541  BP: 132/84  Pulse: 80  Temp: 97.6 F (36.4 C)  TempSrc: Temporal  SpO2: 100%  Weight: 223 lb 3.2 oz (101.2 kg)  Height: 5\' 1"  (1.549 m)   Gen: Pleasant, overweight woman, in no distress,  normal affect  ENT: No lesions,  mouth clear,  oropharynx clear, no postnasal drip  Neck: No JVD, no stridor  Lungs: No use of accessory muscles, no crackles or wheezing on normal respiration, no wheeze on forced expiration  Cardiovascular: RRR, soft early systolic murmur with intact S2  Musculoskeletal: No deformities, no cyanosis or clubbing  Neuro: alert, awake, non focal  Skin: Warm, no lesions or rash       Assessment & Plan:  Asthma  Continue your Symbicort 2 puffs twice a day.  Rinse gargle after using. Keep albuterol available to use 2 puffs up to every 4 hours if needed for shortness of breath, chest tightness, wheezing.  Follow Denise [name] if needed  Thyroid mass of unclear etiology Agree that you may need to have a repeat ultrasound of your thyroid gland. Follow with Denise [name] as planned.  Abnormal CT scan of lung Most recent CT chest shows that her hazy groundglass left upper lobe opacity has resolved.  There is some mild linear scar that is stable and should not need to be followed.  She should not need any repeat imaging unless there is a clinical change.  Baltazar Apo, MD, PhD 10/12/2020, 4:11 PM [facility] [phone] or if no answer [phone]

## 2020-10-12 NOTE — Patient Instructions (Addendum)
Your CT chest shows that your left pulmonary opacity has resolved. This is good news. We do not need to plan to repeat your CT chest at this time.  Agree that you may need to have a repeat ultrasound of your thyroid gland. Follow with Dr Jenny Reichmann as planned.  Continue your Symbicort 2 puffs twice a day.  Rinse gargle after using. Keep albuterol available to use 2 puffs up to every 4 hours if needed for shortness of breath, chest tightness, wheezing.  Follow Dr. Lamonte Sakai if needed
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
## 2020-10-12 NOTE — Assessment & Plan Note (Signed)
Continue your Symbicort 2 puffs twice a day.  Rinse gargle after using. Keep albuterol available to use 2 puffs up to every 4 hours if needed for shortness of breath, chest tightness, wheezing.  Follow Dr. Lamonte Sakai if needed

## 2020-10-12 NOTE — Assessment & Plan Note (Signed)
Most recent CT chest shows that her hazy groundglass left upper lobe opacity has resolved.  There is some mild linear scar that is stable and should not need to be followed.  She should not need any repeat imaging unless there is a clinical change.

## 2020-10-12 NOTE — Patient Instructions (Signed)
You will be contacted regarding the referral for: thyroid ultrasound  Please continue all other medications as before, and refills have been done if requested.  Please have the pharmacy call with any other refills you may need.  Please continue your efforts at being more active, low cholesterol diet, and weight control.  You are otherwise up to date with prevention measures today.  Please keep your appointments with your specialists as you may have planned - Dr Oval Linsey and BP  Please go to the LAB at the blood drawing area for the tests to be done  You will be contacted by phone if any changes need to be made immediately.  Otherwise, you will receive a letter about your results with an explanation, but please check with MyChart first.  Please remember to sign up for MyChart if you have not done so, as this will be important to you in the future with finding out test results, communicating by private email, and scheduling acute appointments online when needed.  Please make an Appointment to return in 6 months, or sooner if needed, also with Lab Appointment for testing done 3-5 days before at the [facility] (so this is for TWO appointments - please see the scheduling desk as you leave)  Due to the ongoing Covid 19 pandemic, our lab now requires an appointment for any labs done at our office.  If you need labs done and do not have an appointment, please call our office ahead of time to schedule before presenting to the lab for your testing.

## 2020-10-15 ENCOUNTER — Encounter: Payer: Self-pay | Admitting: Internal Medicine

## 2020-10-15 NOTE — Assessment & Plan Note (Signed)
BP Readings from Last 3 Encounters:  10/12/20 132/84  10/12/20 (!) 142/80  07/26/20 130/78   Stable, pt to continue medical treatment norvasc

## 2020-10-15 NOTE — Assessment & Plan Note (Signed)
Lab Results  Component Value Date   HGBA1C 6.4 10/12/2020   Stable, pt to continue current medical treatment metformin

## 2020-10-15 NOTE — Assessment & Plan Note (Signed)
With suggestion for increased size mass like per CT; for f/u u/s, consdier general surgury referral

## 2020-10-15 NOTE — Assessment & Plan Note (Signed)
Lab Results  Component Value Date   LDLCALC 77 10/12/2020   Stable, pt to continue current statin lipitor

## 2020-10-15 NOTE — Assessment & Plan Note (Signed)
To cotinue excericse, low chol diet, and liptor 40

## 2020-10-16 NOTE — Progress Notes (Signed)
Patient Care Team: Biagio Borg, MD as PCP - Jordan Hawks, MD as PCP - Cardiology (Cardiology) Sylvan Cheese, NP as Nurse Practitioner (Hematology and Oncology) Katy Apo, MD as Consulting Physician (Ophthalmology)  DIAGNOSIS:    ICD-10-CM   1. Malignant neoplasm of upper-outer quadrant of right breast in female, estrogen receptor positive ([place])  C50.411    Z17.0       SUMMARY OF ONCOLOGIC HISTORY: Oncology History  Breast cancer of upper-outer quadrant of right female breast ([place])  09/18/2015 Initial Diagnosis   Screening right breast mass 9:00: 1 x 0.9 x 0.7 cm: Grade 2 IDC plus DCIS ER 100%, PR 90%, HER-2 negative ratio 1.38, Ki-67 10%; UOQ lesion 3 mm calcification plus distortion: Bx rec; T1 BN 0 stage IA clinical stage    10/17/2015 Surgery   Right lumpectomy ([facility]): IDC grade 2, 1.2 cm, intermediate grade DCIS, margins neg although < 0.1 cm to medial and superior margins, 0/1 LN negative, ER 100%, PR 90%, HER-2 negative ratio 1.37, T1 cN0 stage IA, Oncotype DX 15, 9% ROR, low risk   11/27/2015 - 01/15/2016 Radiation Therapy   Adjuvant radiation therapy [name]). Right breast: 50.4 Gy in 28 fractions. Right breast boost: 14 Gy in 7 fractions.     02/08/2016 -  Anti-estrogen oral therapy   Anastrozole 1 mg daily. Planned duration of therapy: 5 years.       CHIEF COMPLIANT: Follow-up of right breast cancer on anastrozole therapy  INTERVAL HISTORY: Denise [name] is a 75 y.o. with above-mentioned history of right breast cancer treated with lumpectomy, radiation, and who is currently on anastrozole therapy. Mammogram on 05/23/20 showed no evidence of malignancy bilaterally. She presents to the clinic today for annual follow-up.  She has lots of musculoskeletal aches and pains which may or may not be related to anastrozole therapy.  ALLERGIES:  is allergic to doxazosin, clonidine hydrochloride, erythromycin, hydrocodone-acetaminophen,  pork-derived products, and rosiglitazone maleate.  MEDICATIONS:  Current Outpatient Medications  Medication Sig Dispense Refill   acyclovir (ZOVIRAX) 400 MG tablet Take 1 tablet (400 mg total) by mouth 4 (four) times daily. (Patient not taking: Reported on 10/12/2020) 40 tablet 0   albuterol (VENTOLIN HFA) 108 (90 Base) MCG/ACT inhaler Inhale 2 puffs into the lungs every 6 (six) hours as needed for wheezing or shortness of breath. 8.5 g 1   amLODipine (NORVASC) 10 MG tablet Take 1 tablet (10 mg total) by mouth daily. 90 tablet 3   anastrozole (ARIMIDEX) 1 MG tablet Take 1 tablet (1 mg total) by mouth daily. 90 tablet 3   aspirin 81 MG EC tablet Take 81 mg by mouth daily.     atorvastatin (LIPITOR) 40 MG tablet Take 1 tablet (40 mg total) by mouth daily. 180 tablet 3   budesonide-formoterol (SYMBICORT) 160-4.5 MCG/ACT inhaler Inhale 2 puffs into the lungs 2 (two) times daily. 33 g 3   Calcium-Magnesium-Vitamin D (CALCIUM 1200+D3 PO) Take 1 tablet by mouth daily.     cetirizine (EQ ALLERGY RELIEF, CETIRIZINE,) 10 MG tablet TAKE 1 TABLET BY MOUTH ONCE DAILY AS NEEDED FOR  ALLERGIES (Patient taking differently: Take 10 mg by mouth daily as needed for allergies.) 90 tablet 3   Cholecalciferol (VITAMIN D-3) 1000 units CAPS Take 1,000 Units by mouth daily.     cyclobenzaprine (FLEXERIL) 10 MG tablet cyclobenzaprine 10 mg tablet  TAKE 1 TABLET BY MOUTH THREE TIMES DAILY     diclofenac Sodium (VOLTAREN) 1 % GEL Apply 4 g topically  4 (four) times daily. (Patient not taking: Reported on 10/12/2020) 100 g 0   fluticasone (FLONASE) 50 MCG/ACT nasal spray Place 2 sprays into both nostrils daily. 16 g 11   furosemide (LASIX) 40 MG tablet Take 1 tablet (40 mg total) by mouth 2 (two) times daily. 180 tablet 3   metFORMIN (GLUCOPHAGE) 500 MG tablet TAKE 2 TABLETS BY MOUTH TWICE DAILY WITH A MEAL 360 tablet 3   Multiple Vitamins-Minerals (ALIVE WOMENS 50+) TABS Take 1 tablet by mouth daily.     Omega-3 Fatty Acids  (FISH OIL) 1000 MG CAPS Take 1,000 mg by mouth daily.     potassium chloride (MICRO-K) 10 MEQ CR capsule TAKE 4 CAPSULES BY MOUTH ONCE DAILY 360 capsule 3   predniSONE (DELTASONE) 10 MG tablet 3 tabs by mouth per day for 3 days,2tabs per day for 3 days,1tab per day for 3 days 18 tablet 0   valsartan (DIOVAN) 40 MG tablet Take 0.5 tablets (20 mg total) by mouth as directed. Take 1/2 tablet daily 45 tablet 3   No current facility-administered medications for this visit.    PHYSICAL EXAMINATION: ECOG PERFORMANCE STATUS: 2 - Symptomatic, <50% confined to bed  Vitals:   10/17/20 1515  BP: 140/68  Pulse: 78  Resp: 19  Temp: 97.8 F (36.6 C)  SpO2: 99%   Filed Weights   10/17/20 1515  Weight: 224 lb (101.6 kg)    BREAST: No palpable masses or nodules in either right or left breasts. No palpable axillary supraclavicular or infraclavicular adenopathy no breast tenderness or nipple discharge. (exam performed in the presence of a chaperone)  LABORATORY DATA:  I have reviewed the data as listed CMP Latest Ref Rng & Units 10/12/2020 04/13/2020 04/11/2020  Glucose 70 - 99 mg/dL 96 112(H) 122(H)  BUN 6 - 23 mg/dL 18 16 7(L)  Creatinine 0.40 - 1.20 mg/dL 0.84 1.02 0.72  Sodium 135 - 145 mEq/L 141 139 140  Potassium 3.5 - 5.1 mEq/L 3.5 4.0 4.0  Chloride 96 - 112 mEq/L 103 104 104  CO2 19 - 32 mEq/L 29 28 27   Calcium 8.4 - 10.5 mg/dL 9.7 9.6 9.5  Total Protein 6.0 - 8.3 g/dL 7.7 7.7 -  Total Bilirubin 0.2 - 1.2 mg/dL 0.8 0.7 -  Alkaline Phos 39 - 117 U/L 51 50 -  AST 0 - 37 U/L 26 16 -  ALT 0 - 35 U/L 25 16 -    Lab Results  Component Value Date   WBC 10.2 10/12/2020   HGB 13.1 10/12/2020   HCT 39.3 10/12/2020   MCV 81.7 10/12/2020   PLT 201.0 10/12/2020   NEUTROABS 5.9 10/12/2020    ASSESSMENT & PLAN:  Breast cancer of upper-outer quadrant of right female breast ([place]) Right lumpectomy 10/16/2068: IDC grade 2, 1.2 cm, intermediate grade DCIS, margins negative although less than  0.1 cm to medial margin and superior margin, 0/1 lymph node negative, ER 100%, PR 90%, HER-2 negative ratio 1.37, T1 cN0 stage IA Oncotype DX score 15: Risk of recurrence 9% Adjuvant radiation: 11/27/2015- 01/15/2016   Current treatment: Anastrozole 1 mg daily started 02/08/2016   Anastrozole toxicities: Musculoskeletal aches and pains are most likely related to osteoarthritis versus anastrozole. Based on risks and benefits, we decided to discontinue anastrozole therapy having completed 5 years.   Diabetes: on metformin   Breast cancer surveillance:  1.  Breast exam 10/17/2020: Benign, scar tissue from prior surgeries. 2. Mammogram 05/23/2020: Breast density category C postsurgical and postradiation  changes and large postoperative seroma     Return to clinic in 1 year for follow-up    No orders of the defined types were placed in this encounter.  The patient has a good understanding of the overall plan. she agrees with it. she will call with any problems that may develop before the next visit here.  Total time spent: 20 mins including face to face time and time spent for planning, charting and coordination of care  Rulon Eisenmenger, MD, MPH 10/17/2020  I, Thana Ates, am acting as scribe for Dr. Nicholas Lose.  I have reviewed the above documentation for accuracy and completeness, and I agree with the above.

## 2020-10-17 ENCOUNTER — Inpatient Hospital Stay: Payer: Medicare Other | Attending: Hematology and Oncology | Admitting: Hematology and Oncology

## 2020-10-17 ENCOUNTER — Other Ambulatory Visit: Payer: Self-pay

## 2020-10-17 DIAGNOSIS — C50411 Malignant neoplasm of upper-outer quadrant of right female breast: Secondary | ICD-10-CM | POA: Diagnosis not present

## 2020-10-17 DIAGNOSIS — Z79899 Other long term (current) drug therapy: Secondary | ICD-10-CM | POA: Diagnosis not present

## 2020-10-17 DIAGNOSIS — Z923 Personal history of irradiation: Secondary | ICD-10-CM | POA: Diagnosis not present

## 2020-10-17 DIAGNOSIS — Z170 Estrogen receptor positive status [ER+]: Secondary | ICD-10-CM | POA: Diagnosis not present

## 2020-10-17 NOTE — Assessment & Plan Note (Signed)
Right lumpectomy 10/16/2068: IDC grade 2, 1.2 cm, intermediate grade DCIS, margins negative although less than 0.1 cm to medial margin and superior margin, 0/1 lymph node negative, ER 100%, PR 90%, HER-2 negative ratio 1.37, T1 cN0 stage IA Oncotype DX score 15: Risk of recurrence 9% Adjuvant radiation: 11/27/2015-01/15/2016  Current treatment: Anastrozole 1 mg daily started 02/08/2016  Anastrozole toxicities: Denies any hot flashes or myalgias Renewed her prescription  Diabetes: on metformin  Breast cancer surveillance: 1.Breast exam 10/17/2020: Benign 2.Mammogram1/25/2022: Breast density categoryCpostsurgical and postradiation changes and large postoperative seroma   Shes a Therapist, 

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
## 2020-10-26 ENCOUNTER — Other Ambulatory Visit: Payer: Self-pay

## 2020-10-26 ENCOUNTER — Ambulatory Visit
Admission: RE | Admit: 2020-10-26 | Discharge: 2020-10-26 | Disposition: A | Discharge status: Home / Self Care | Payer: Medicare Other | Source: Ambulatory Visit | Attending: Internal Medicine | Admitting: Internal Medicine

## 2020-10-26 ENCOUNTER — Encounter: Payer: Self-pay | Admitting: Internal Medicine

## 2020-10-26 DIAGNOSIS — E0789 Other specified disorders of thyroid: Secondary | ICD-10-CM

## 2020-10-26 DIAGNOSIS — E041 Nontoxic single thyroid nodule: Secondary | ICD-10-CM | POA: Diagnosis not present

## 2020-10-27 ENCOUNTER — Encounter (HOSPITAL_BASED_OUTPATIENT_CLINIC_OR_DEPARTMENT_OTHER): Payer: Self-pay | Admitting: Cardiology

## 2020-10-27 ENCOUNTER — Ambulatory Visit (INDEPENDENT_AMBULATORY_CARE_PROVIDER_SITE_OTHER): Payer: Medicare Other | Admitting: Cardiology

## 2020-10-27 VITALS — BP 128/70 | HR 90 | Ht 61.0 in | Wt 225.8 lb

## 2020-10-27 DIAGNOSIS — Z952 Presence of prosthetic heart valve: Secondary | ICD-10-CM

## 2020-10-27 DIAGNOSIS — I6523 Occlusion and stenosis of bilateral carotid arteries: Secondary | ICD-10-CM | POA: Diagnosis not present

## 2020-10-27 DIAGNOSIS — I350 Nonrheumatic aortic (valve) stenosis: Secondary | ICD-10-CM

## 2020-10-27 DIAGNOSIS — E119 Type 2 diabetes mellitus without complications: Secondary | ICD-10-CM | POA: Diagnosis not present

## 2020-10-27 DIAGNOSIS — E785 Hyperlipidemia, unspecified: Secondary | ICD-10-CM | POA: Diagnosis not present

## 2020-10-27 DIAGNOSIS — I10 Essential (primary) hypertension: Secondary | ICD-10-CM | POA: Diagnosis not present

## 2020-10-27 NOTE — Patient Instructions (Signed)
Medication Instructions:  Your physician recommends that you continue on your current medications as directed. Please refer to the Current Medication list given to you today. *If you need a refill on your cardiac medications before your next appointment, please call your pharmacy*  Lab Work: If you have labs (blood work) drawn today and your tests are completely normal, you will receive your results only by: [place] (if you have MyChart) OR A paper copy in the mail If you have any lab test that is abnormal or we need to change your treatment, we will call you to review the results.  Testing/Procedures: Your physician has requested that you have an echocardiogram. Echocardiography is a painless test that uses sound waves to create images of your heart. It provides your doctor with information about the size and shape of your heart and how well your heart's chambers and valves are working. This procedure takes approximately one hour. There are no restrictions for this procedure.  Follow-Up: At [facility], you and your health needs are our priority.  As part of our continuing mission to provide you with exceptional heart care, we have created designated Provider Care Teams.  These Care Teams include your primary Cardiologist (physician) and Advanced Practice Providers (APPs -  Physician Assistants and Nurse Practitioners) who all work together to provide you with the care you need, when you need it.  We recommend signing up for the patient portal called "MyChart".  Sign up information is provided on this After Visit Summary.  MyChart is used to connect with patients for Virtual Visits (Telemedicine).  Patients are able to view lab/test results, encounter notes, upcoming appointments, etc.  Non-urgent messages can be sent to your provider as well.   To learn more about what you can do with MyChart, go to [email or web address].    Your next appointment:   6 month(s)  The format for  your next appointment:   In Person  Provider:   You may see Skeet Latch, MD or one of the following Advanced Practice Providers on your designated Care Team:   [place], PA-C Coletta Memos, FNP

## 2020-10-31 DIAGNOSIS — M7918 Myalgia, other site: Secondary | ICD-10-CM | POA: Insufficient documentation

## 2020-11-06 DIAGNOSIS — M5416 Radiculopathy, lumbar region: Secondary | ICD-10-CM | POA: Diagnosis not present

## 2020-11-09 ENCOUNTER — Ambulatory Visit: Payer: Medicare Other | Admitting: Hematology and Oncology

## 2020-11-10 ENCOUNTER — Encounter: Payer: Self-pay | Admitting: Internal Medicine

## 2020-11-10 ENCOUNTER — Ambulatory Visit (HOSPITAL_BASED_OUTPATIENT_CLINIC_OR_DEPARTMENT_OTHER): Payer: Medicare Other | Admitting: Cardiovascular Disease

## 2020-11-16 ENCOUNTER — Encounter: Payer: Self-pay | Admitting: Internal Medicine

## 2020-11-20 ENCOUNTER — Other Ambulatory Visit: Payer: Self-pay

## 2020-11-20 ENCOUNTER — Ambulatory Visit (HOSPITAL_COMMUNITY): Payer: Medicare Other | Attending: Internal Medicine

## 2020-11-20 DIAGNOSIS — I350 Nonrheumatic aortic (valve) stenosis: Secondary | ICD-10-CM

## 2020-11-20 DIAGNOSIS — Z952 Presence of prosthetic heart valve: Secondary | ICD-10-CM

## 2020-11-20 LAB — ECHOCARDIOGRAM COMPLETE
AR max vel: 1.41 cm2
AV Area VTI: 1.59 cm2
AV Area mean vel: 1.51 cm2
AV Mean grad: 19 mmHg
AV Peak grad: 36.5 mmHg
Ao pk vel: 3.02 m/s
Area-P 1/2: 2.85 cm2
P 1/2 time: 393 msec
S' Lateral: 3 cm

## 2020-11-22 ENCOUNTER — Telehealth: Payer: Self-pay | Admitting: Cardiology

## 2020-11-22 NOTE — Telephone Encounter (Signed)
Left message to call back, needs 6 month appointment follow up

## 2020-11-22 NOTE — Telephone Encounter (Signed)
Freada Bergeron, MD  11/20/2020  4:42 PM EDT      Her echo looks stable with a normal functioning TAVR valve. The heart pumping function looks great. She has moderately elevated pressures in her lungs. We need to ensure her blood pressure is well controlled and she is not having swelling. If she is snoring or having abnormal breathing at night, we should check a sleep study too as this can make pressures in the lungs high. If not,we will just continue to monitor closely.     The patient has been notified of the result and verbalized understanding.  All questions (if any) were answered.   Pt states her swelling is well controlled, and she is watching her salt intake, and BP's closely.  She is a night shift nurse.   Pt states she has no complaints of snoring or abnormal breathing at night.  She states her family members have never mentioned any sleep disturbance or snoring.  She adds she feels rested when she wakes up from sleep. She states she has asthma and has recently missed several days of her maintenance inhaler (forgot to take), which flared her up, but is controlled now since resuming inhaler. Pt states her BP's have been stable at 130s/80s.  She states she does not have any increased lower extremity edema, and monitors this closely.  She adds she wears her compressions at work. Pt states she will keep Dr. Oval Linsey posted as needed, if swelling increases, BP's elevate, and breathing/snoring changes at night.  Pt did state that Dr. Johney Frame wanted her to see Dr. Oval Linsey back in clinic for 6 month follow-up, but hasn't heard from anyone about scheduling this.  Informed the pt that I will route this message to Dr. Blenda Mounts nurse to assist with getting her an appt.  Pt verbalized understanding and agrees with this plan.  Pt was more than gracious for all the assistance provided.

## 2020-11-22 NOTE — Telephone Encounter (Signed)
Ms. Guillory is returning Ivy's call from yesterday in regards to her Echo results. She is requesting a callback on her home number. Reports her cell phone is not ringing for some reason. Please advise.

## 2020-12-07 DIAGNOSIS — I10 Essential (primary) hypertension: Secondary | ICD-10-CM | POA: Diagnosis not present

## 2020-12-07 DIAGNOSIS — Z6841 Body Mass Index (BMI) 40.0 and over, adult: Secondary | ICD-10-CM | POA: Diagnosis not present

## 2020-12-07 DIAGNOSIS — M5440 Lumbago with sciatica, unspecified side: Secondary | ICD-10-CM | POA: Diagnosis not present

## 2020-12-13 ENCOUNTER — Other Ambulatory Visit: Payer: Self-pay | Admitting: Neurosurgery

## 2020-12-14 ENCOUNTER — Telehealth: Payer: Self-pay | Admitting: *Deleted

## 2020-12-14 NOTE — Telephone Encounter (Signed)
   [facility] Pre-operative Risk Assessment    Patient Name: Delanee Xin  DOB: Oct 04, 1945 MRN: [id number]   Request for surgical clearance:  What type of surgery is being performed? L1-2 Lumbar Fusion w/removal of hardware  When is this surgery scheduled? 01/12/1921  What type of clearance is required (medical clearance vs. Pharmacy clearance to hold med vs. Both)? Medical  Are there any medications that need to be held prior to surgery and how long? None  Practice name and name of physician performing surgery? [facility], Dr Elaina Hoops  What is the office phone number? [phone]   7.   What is the office fax number? [phone]  8.   Anesthesia type (None, local, MAC, general) ? Samule Ohm, Jeralene Huff 12/14/2020, 10:08 AM  _________________________________________________________________   (provider comments below)

## 2020-12-15 NOTE — Telephone Encounter (Signed)
   Primary Cardiologist: Skeet Latch, MD  Chart reviewed as part of pre-operative protocol coverage. Given past medical history and time since last visit, based on ACC/AHA guidelines, Denise [name] would be at acceptable risk for the planned procedure without further cardiovascular testing.   Her RCRI is a class III risk, 6.6% risk of major cardiac event.  She is able to complete greater than 4 METS of physical activity.  I will route this recommendation to the requesting party via Epic fax function and remove from pre-op pool.  Please call with questions.  Denise [name]. Denise Bauer NP-C    12/15/2020, 8:46 AM [address] Office [phone] Fax [phone]

## 2020-12-22 ENCOUNTER — Telehealth: Payer: Self-pay | Admitting: Internal Medicine

## 2020-12-25 ENCOUNTER — Telehealth: Payer: Self-pay | Admitting: Internal Medicine

## 2020-12-25 NOTE — Telephone Encounter (Signed)
Pt had a wasp sting and is having an allergic reaction, swelling on the wound  Caller has concerns for cellulitis, caller states sting is on left wrist  Advised to see PCP in 24 hours if office open  Team health added to media

## 2020-12-26 NOTE — Telephone Encounter (Signed)
LVM instructions for pt to call back to get more information.

## 2020-12-26 NOTE — Telephone Encounter (Signed)
Pt states swelling has gone down. Itching has subsided & no warmth at site. Pt states she used antibiotic cream. Denies fever.  Pt advised to call if site gets worse; pt verb understanding.

## 2021-01-02 NOTE — Pre-Procedure Instructions (Addendum)
Surgical Instructions    Your procedure is scheduled on Friday, September 16th.  Report to [facility] Main Entrance "A" at 5:30 A.M., then check in with the Admitting office.  Call this number if you have problems the morning of surgery:  [phone]   If you have any questions prior to your surgery date call [phone]: Open Monday-Friday 8am-4pm    Remember:  Do not eat or drink after midnight the night before your surgery   Take these medicines the morning of surgery with A SIP OF WATER  amLODipine (NORVASC)  anastrozole (ARIMIDEX)  atorvastatin (LIPITOR budesonide-formoterol (SYMBICORT) cyclobenzaprine (FLEXERIL)  fluticasone (FLONASE) potassium chloride (MICRO-K)    Take these medications as needed: Albuterol Inhaler-please bring all inhalers with you to the hospital Cetirizine  Follow your surgeon's instructions on when to stop Aspirin.  If no instructions were given by your surgeon then you will need to call the office to get those instructions.    As of today, STOP taking any Aspirin (unless otherwise instructed by your surgeon) Aleve, Naproxen, Ibuprofen, Motrin, Advil, Goody's, BC's, all herbal medications, fish oil, and all vitamins. This includes: diclofenac Sodium (VOLTAREN) gel.  WHAT DO I DO ABOUT MY DIABETES MEDICATION?   Do not take metFORMIN (GLUCOPHAGE) the morning of surgery.   HOW TO MANAGE YOUR DIABETES BEFORE AND AFTER SURGERY  Why is it important to control my blood sugar before and after surgery? Improving blood sugar levels before and after surgery helps healing and can limit problems. A way of improving blood sugar control is eating a healthy diet by:  Eating less sugar and carbohydrates  Increasing activity/exercise  Talking with your doctor about reaching your blood sugar goals High blood sugars (greater than 180 mg/dL) can raise your risk of infections and slow your recovery, so you will need to focus on controlling your diabetes during  the weeks before surgery. Make sure that the doctor who takes care of your diabetes knows about your planned surgery including the date and location.  How do I manage my blood sugar before surgery? Check your blood sugar at least 4 times a day, starting 2 days before surgery, to make sure that the level is not too high or low.  Check your blood sugar the morning of your surgery when you wake up and every 2 hours until you get to the Short Stay unit.  If your blood sugar is less than 70 mg/dL, you will need to treat for low blood sugar: Do not take insulin. Treat a low blood sugar (less than 70 mg/dL) with  cup of clear juice (cranberry or apple), 4 glucose tablets, OR glucose gel. Recheck blood sugar in 15 minutes after treatment (to make sure it is greater than 70 mg/dL). If your blood sugar is not greater than 70 mg/dL on recheck, call [phone] for further instructions. Report your blood sugar to the short stay nurse when you get to Short Stay.  If you are admitted to the hospital after surgery: Your blood sugar will be checked by the staff and you will probably be given insulin after surgery (instead of oral diabetes medicines) to make sure you have good blood sugar levels. The goal for blood sugar control after surgery is 80-180 mg/dL.                      Do NOT Smoke (Tobacco/Vaping) or drink Alcohol 24 hours prior to your procedure.  If you use a CPAP at night, you  may bring all equipment for your overnight stay.   Contacts, glasses, piercing's, hearing aid's, dentures or partials may not be worn into surgery, please bring cases for these belongings.    For patients admitted to the hospital, discharge time will be determined by your treatment team.   Patients discharged the day of surgery will not be allowed to drive home, and someone needs to stay with them for 24 hours.  ONLY 1 SUPPORT PERSON MAY BE PRESENT WHILE YOU ARE IN SURGERY. IF YOU ARE TO BE ADMITTED ONCE YOU ARE  IN YOUR ROOM YOU WILL BE ALLOWED TWO (2) VISITORS.  Minor children may have two parents present. Special consideration for safety and communication needs will be reviewed on a case by case basis.   Special instructions:   [place]- Preparing For Surgery  Before surgery, you can play an important role. Because skin is not sterile, your skin needs to be as free of germs as possible. You can reduce the number of germs on your skin by washing with CHG (chlorahexidine gluconate) Soap before surgery.  CHG is an antiseptic cleaner which kills germs and bonds with the skin to continue killing germs even after washing.    Oral Hygiene is also important to reduce your risk of infection.  Remember - BRUSH YOUR TEETH THE MORNING OF SURGERY WITH YOUR REGULAR TOOTHPASTE  Please do not use if you have an allergy to CHG or antibacterial soaps. If your skin becomes reddened/irritated stop using the CHG.  Do not shave (including legs and underarms) for at least 48 hours prior to first CHG shower. It is OK to shave your face.  Please follow these instructions carefully.   Shower the NIGHT BEFORE SURGERY and the MORNING OF SURGERY  If you chose to wash your hair, wash your hair first as usual with your normal shampoo.  After you shampoo, rinse your hair and body thoroughly to remove the shampoo.  Use CHG Soap as you would any other liquid soap. You can apply CHG directly to the skin and wash gently with a scrungie or a clean washcloth.   Apply the CHG Soap to your body ONLY FROM THE NECK DOWN.  Do not use on open wounds or open sores. Avoid contact with your eyes, ears, mouth and genitals (private parts). Wash Face and genitals (private parts)  with your normal soap.   Wash thoroughly, paying special attention to the area where your surgery will be performed.  Thoroughly rinse your body with warm water from the neck down.  DO NOT shower/wash with your normal soap after using and rinsing off the CHG  Soap.  Pat yourself dry with a CLEAN TOWEL.  Wear CLEAN PAJAMAS to bed the night before surgery  Place CLEAN SHEETS on your bed the night before your surgery  DO NOT SLEEP WITH PETS.   Day of Surgery: Shower with CHG soap. Do not wear jewelry, make up, nail polish, gel polish, artificial nails, or any other type of covering on natural nails including finger and toenails. If patients have artificial nails, gel coating, etc. that need to be removed by a nail salon please have this removed prior to surgery. Surgery may need to be canceled/delayed if the surgeon/ anesthesia feels like the patient is unable to be adequately monitored. Do not wear lotions, powders, perfumes, or deodorant. Do not shave 48 hours prior to surgery.  Do not bring valuables to the hospital. [facility] is not responsible for any belongings or  valuables. Wear Clean/Comfortable clothing the morning of surgery Remember to brush your teeth WITH YOUR REGULAR TOOTHPASTE.   Please read over the following fact sheets that you were given.

## 2021-01-03 ENCOUNTER — Encounter (HOSPITAL_COMMUNITY): Payer: Self-pay

## 2021-01-03 ENCOUNTER — Encounter (HOSPITAL_COMMUNITY)
Admission: RE | Admit: 2021-01-03 | Discharge: 2021-01-03 | Disposition: A | Discharge status: Home / Self Care | Payer: Medicare Other | Source: Ambulatory Visit | Attending: Neurosurgery | Admitting: Neurosurgery

## 2021-01-03 ENCOUNTER — Other Ambulatory Visit: Payer: Self-pay

## 2021-01-03 DIAGNOSIS — E119 Type 2 diabetes mellitus without complications: Secondary | ICD-10-CM | POA: Insufficient documentation

## 2021-01-03 DIAGNOSIS — M545 Low back pain, unspecified: Secondary | ICD-10-CM | POA: Diagnosis not present

## 2021-01-03 DIAGNOSIS — Z01812 Encounter for preprocedural laboratory examination: Secondary | ICD-10-CM | POA: Insufficient documentation

## 2021-01-03 LAB — HEMOGLOBIN A1C
Hgb A1c MFr Bld: 6.1 % — ABNORMAL HIGH (ref 4.8–5.6)
Mean Plasma Glucose: 128.37 mg/dL

## 2021-01-03 LAB — BASIC METABOLIC PANEL
Anion gap: 9 (ref 5–15)
BUN: 15 mg/dL (ref 8–23)
CO2: 23 mmol/L (ref 22–32)
Calcium: 9.5 mg/dL (ref 8.9–10.3)
Chloride: 108 mmol/L (ref 98–111)
Creatinine, Ser: 0.84 mg/dL (ref 0.44–1.00)
GFR, Estimated: >60 mL/min (ref >60)
Glucose, Bld: 204 mg/dL — ABNORMAL HIGH (ref 70–99)
Potassium: 3.5 mmol/L (ref 3.5–5.1)
Sodium: 140 mmol/L (ref 135–145)

## 2021-01-03 LAB — CBC
HCT: 39.7 % (ref 36.0–46.0)
Hemoglobin: 12.9 g/dL (ref 12.0–15.0)
MCH: 27.3 pg (ref 26.0–34.0)
MCHC: 32.5 g/dL (ref 30.0–36.0)
MCV: 84.1 fL (ref 80.0–100.0)
Platelets: 216 10*3/uL (ref 150–400)
RBC: 4.72 MIL/uL (ref 3.87–5.11)
RDW: 15 % (ref 11.5–15.5)
WBC: 8.8 10*3/uL (ref 4.0–10.5)
nRBC: 0 % (ref 0.0–0.2)

## 2021-01-03 LAB — TYPE AND SCREEN
ABO/RH(D): A POS
Antibody Screen: NEGATIVE

## 2021-01-03 LAB — GLUCOSE, CAPILLARY: Glucose-Capillary: 190 mg/dL — ABNORMAL HIGH (ref 70–99)

## 2021-01-03 LAB — SURGICAL PCR SCREEN
MRSA, PCR: NEGATIVE
Staphylococcus aureus: NEGATIVE

## 2021-01-03 NOTE — Progress Notes (Signed)
Surgical Instructions                 Your procedure is scheduled on Friday, September 16th.             Report to [facility] Main Entrance "A" at 5:30 A.M., then check in with the Admitting office.             Call this number if you have problems the morning of surgery:             [phone]    If you have any questions prior to your surgery date call [phone]: Open Monday-Friday 8am-4pm                 Remember:             Do not eat or drink after midnight the night before your surgery               Take these medicines the morning of surgery with A SIP OF WATER  amLODipine (NORVASC)  anastrozole (ARIMIDEX)  atorvastatin (LIPITOR budesonide-formoterol (SYMBICORT) fluticasone (FLONASE) potassium chloride (MICRO-K)                Take these medications if needed: acetaminophen (TYLENOL) 500 MG tablet Albuterol Inhaler-please bring all inhalers with you to the hospital Cetirizine cyclobenzaprine (FLEXERIL) Polyvinyl Alcohol-Povidone (REFRESH OP)  Follow your surgeon's instructions on when to stop Aspirin.  If no instructions were given by your surgeon then you will need to call the office to get those instructions.     As of today, STOP taking any Aspirin (unless otherwise instructed by your surgeon) Aleve, Naproxen, Ibuprofen, Motrin, Advil, Goody's, BC's, all herbal medications, fish oil, and all vitamins. This includes: diclofenac Sodium (VOLTAREN) gel.   WHAT DO I DO ABOUT MY DIABETES MEDICATION?     Do not take metFORMIN (GLUCOPHAGE) the morning of surgery.     HOW TO MANAGE YOUR DIABETES BEFORE AND AFTER SURGERY   Why is it important to control my blood sugar before and after surgery? Improving blood sugar levels before and after surgery helps healing and can limit problems. A way of improving blood sugar control is eating a healthy diet by:  Eating less sugar and carbohydrates  Increasing activity/exercise  Talking with your doctor about reaching your  blood sugar goals High blood sugars (greater than 180 mg/dL) can raise your risk of infections and slow your recovery, so you will need to focus on controlling your diabetes during the weeks before surgery. Make sure that the doctor who takes care of your diabetes knows about your planned surgery including the date and location.   How do I manage my blood sugar before surgery? Check your blood sugar at least 4 times a day, starting 2 days before surgery, to make sure that the level is not too high or low.   Check your blood sugar the morning of your surgery when you wake up and every 2 hours until you get to the Short Stay unit.   If your blood sugar is less than 70 mg/dL, you will need to treat for low blood sugar: Do not take insulin. Treat a low blood sugar (less than 70 mg/dL) with  cup of clear juice (cranberry or apple), 4 glucose tablets, OR glucose gel. Recheck blood sugar in 15 minutes after treatment (to make sure it is greater than 70 mg/dL). If your blood sugar is not greater than 70 mg/dL on recheck, call [phone] for  further instructions. Report your blood sugar to the short stay nurse when you get to Short Stay.   If you are admitted to the hospital after surgery: Your blood sugar will be checked by the staff and you will probably be given insulin after surgery (instead of oral diabetes medicines) to make sure you have good blood sugar levels. The goal for blood sugar control after surgery is 80-180 mg/dL.                       Do NOT Smoke (Tobacco/Vaping) or drink Alcohol 24 hours prior to your procedure.   If you use a CPAP at night, you may bring all equipment for your overnight stay.   Contacts, glasses, piercing's, hearing aid's, dentures or partials may not be worn into surgery, please bring cases for these belongings.    For patients admitted to the hospital, discharge time will be determined by your treatment team.   Patients discharged the day of surgery  will not be allowed to drive home, and someone needs to stay with them for 24 hours.   ONLY 1 SUPPORT PERSON MAY BE PRESENT WHILE YOU ARE IN SURGERY. IF YOU ARE TO BE ADMITTED ONCE YOU ARE IN YOUR ROOM YOU WILL BE ALLOWED TWO (2) VISITORS.  Minor children may have two parents present. Special consideration for safety and communication needs will be reviewed on a case by case basis.     Special instructions:   [facility] [place]- Preparing For Surgery   Before surgery, you can play an important role. Because skin is not sterile, your skin needs to be as free of germs as possible. You can reduce the number of germs on your skin by washing with CHG (chlorahexidine gluconate) Soap before surgery.  CHG is an antiseptic cleaner which kills germs and bonds with the skin to continue killing germs even after washing.     Oral Hygiene is also important to reduce your risk of infection.  Remember - BRUSH YOUR TEETH THE MORNING OF SURGERY WITH YOUR REGULAR TOOTHPASTE   Please do not use if you have an allergy to CHG or antibacterial soaps. If your skin becomes reddened/irritated stop using the CHG.  Do not shave (including legs and underarms) for at least 48 hours prior to first CHG shower. It is OK to shave your face.   Please follow these instructions carefully.                                                                                                                               Shower the NIGHT BEFORE SURGERY and the MORNING OF SURGERY   If you chose to wash your hair, wash your hair first as usual with your normal shampoo.   After you shampoo, rinse your hair and body thoroughly to remove the shampoo.   Use CHG Soap as you would any other liquid soap. You can apply CHG directly to  the skin and wash gently with a scrungie or a clean washcloth.    Apply the CHG Soap to your body ONLY FROM THE NECK DOWN.  Do not use on open wounds or open sores. Avoid contact with your eyes, ears, mouth and genitals  (private parts). Wash Face and genitals (private parts)  with your normal soap.    Wash thoroughly, paying special attention to the area where your surgery will be performed.   Thoroughly rinse your body with warm water from the neck down.   DO NOT shower/wash with your normal soap after using and rinsing off the CHG Soap.   Pat yourself dry with a CLEAN TOWEL.   Wear CLEAN PAJAMAS to bed the night before surgery   Place CLEAN SHEETS on your bed the night before your surgery   DO NOT SLEEP WITH PETS.     Day of Surgery: Shower with CHG soap. Do not wear jewelry, make up, nail polish, gel polish, artificial nails, or any other type of covering on natural nails including finger and toenails. If patients have artificial nails, gel coating, etc. that need to be removed by a nail salon please have this removed prior to surgery. Surgery may need to be canceled/delayed if the surgeon/ anesthesia feels like the patient is unable to be adequately monitored. Do not wear lotions, powders, perfumes, or deodorant. Do not shave 48 hours prior to surgery.  Do not bring valuables to the hospital. [facility] is not responsible for any belongings or valuables. Wear Clean/Comfortable clothing the morning of surgery Remember to brush your teeth WITH YOUR REGULAR TOOTHPASTE.   Please read over the following fact sheets that you were given.

## 2021-01-03 NOTE — Progress Notes (Signed)
PCP - Dr. Cathlean Cower Cardiologist - Dr. Skeet Latch  Chest x-ray - Not indicated EKG - 04/18/20 Stress Test - 2004 ECHO - 11/20/20 Cardiac Cath - 2018  Sleep Study - No denies OSA  DM - Type II Blood Sugar at PAT appt 190. Patient had eaten at [place] once every two months  Aspirin Instructions:Dr. Saintclair Halsted instructed to stop 7 days prior to surgery.   COVID TEST- September 13th - Tuesday   Anesthesia review: Yes cardiac history  Patient denies shortness of breath, fever, cough and chest pain at PAT appointment   All instructions explained to the patient, with a verbal understanding of the material. Patient agrees to go over the instructions while at home for a better understanding. Patient also instructed to wear a mask while in public after being tested for COVID-19. The opportunity to ask questions was provided.

## 2021-01-04 NOTE — Anesthesia Preprocedure Evaluation (Addendum)
Anesthesia Evaluation  Patient identified by MRN, date of birth, ID band Patient awake    Reviewed: Allergy & Precautions, H&P , NPO status , Patient's Chart, lab work & pertinent test results  Airway Mallampati: II   Neck ROM: full    Dental   Pulmonary asthma , former smoker,    breath sounds clear to auscultation       Cardiovascular hypertension, + CAD  + Valvular Problems/Murmurs  Rhythm:regular Rate:Normal  S/p TAVR 2018  TTE (10/2020): EF 65%, normal functioning TAVR valve.   Neuro/Psych PSYCHIATRIC DISORDERS Anxiety Depression  Neuromuscular disease    GI/Hepatic GERD  ,  Endo/Other  diabetes, Type 2Morbid obesity  Renal/GU      Musculoskeletal  (+) Arthritis ,   Abdominal   Peds  Hematology   Anesthesia Other Findings   Reproductive/Obstetrics                            Anesthesia Physical Anesthesia Plan  ASA: 3  Anesthesia Plan: General   Post-op Pain Management:    Induction: Intravenous  PONV Risk Score and Plan: 3 and Ondansetron, Dexamethasone and Treatment may vary due to age or medical condition  Airway Management Planned: Oral ETT  Additional Equipment: Arterial line  Intra-op Plan:   Post-operative Plan:   Informed Consent: I have reviewed the patients History and Physical, chart, labs and discussed the procedure including the risks, benefits and alternatives for the proposed anesthesia with the patient or authorized representative who has indicated his/her understanding and acceptance.     Dental advisory given  Plan Discussed with: CRNA, Anesthesiologist and Surgeon  Anesthesia Plan Comments: (PAT note written 01/04/2021 by Myra Gianotti, PA-C. )       Anesthesia Quick Evaluation
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
## 2021-01-05 ENCOUNTER — Telehealth: Payer: Self-pay | Admitting: Emergency Medicine

## 2021-01-05 NOTE — Telephone Encounter (Signed)
Fax received from Dr. Kary Kos to perform a L1-2 lumbar fusion with removal of hardware under general anesthesia on 01/12/21 for patient.  Patient needs surgery clearance. Patient hasnt been seen. Called patient left message to make an appointment. .   Sending to Dr. Lamonte Sakai for risk assessment or recommendations if patient needs to be seen in office prior to surgical procedure.   Unless otherwise stated by RB patient needs appointment next week 9/12-9/14

## 2021-01-05 NOTE — Telephone Encounter (Signed)
Called and spoke with pt about scheduling an appt for surgical clearance. Pt said that an appt was scheduled for her Mon 9/12. Looked at pt's list and saw that appt was made for pt. Nothing further needed.

## 2021-01-05 NOTE — Telephone Encounter (Signed)
She needs to be seen ASAP - even as a phone visit - to insure asthma is stable, assess pre-op risk. Can be w RB or any provider since there is a time restriction.

## 2021-01-08 ENCOUNTER — Other Ambulatory Visit: Payer: Self-pay

## 2021-01-08 ENCOUNTER — Encounter (HOSPITAL_BASED_OUTPATIENT_CLINIC_OR_DEPARTMENT_OTHER): Payer: Self-pay

## 2021-01-08 ENCOUNTER — Ambulatory Visit (INDEPENDENT_AMBULATORY_CARE_PROVIDER_SITE_OTHER): Payer: Medicare Other | Admitting: Acute Care

## 2021-01-08 ENCOUNTER — Encounter: Payer: Self-pay | Admitting: Acute Care

## 2021-01-08 VITALS — BP 122/82 | HR 85 | Temp 97.3°F | Ht 61.0 in | Wt 221.0 lb

## 2021-01-08 DIAGNOSIS — Z01818 Encounter for other preprocedural examination: Secondary | ICD-10-CM | POA: Diagnosis not present

## 2021-01-08 DIAGNOSIS — I6523 Occlusion and stenosis of bilateral carotid arteries: Secondary | ICD-10-CM | POA: Diagnosis not present

## 2021-01-08 DIAGNOSIS — J45909 Unspecified asthma, uncomplicated: Secondary | ICD-10-CM

## 2021-01-08 NOTE — Patient Instructions (Addendum)
It is good to see you today.  We will clear you for your Surgery Friday 01/12/2021. You do have an increased risk of pulmonary complications to include  pneumonia; b) recurrent intubation risk; c) prolonged or recurrent acute respiratory failure needing mechanical ventilation; d) prolonged hospitalization; e) DVT/Pulmonary embolism; f) Acute Pulmonary edema. Take your Symbicort and albuterol inhalers to the hospital with you.  Use your Symbicort morning of surgery. We make these following recommendations   Recommendations: Short duration of surgery as much as possible and avoid paralytic if possible 2. Recovery in step down with albuterol nebs post op if any signs of wheezing 3. DVT prophylaxis 4. Aggressive pulmonary toilet with o2, bronchodilatation, and incentive spirometry and early ambulation with Physical Therapy. 5. Continue Symbicort 2 puffs twice daily , use morning of surgery. 6. Take albuterol as needed for shortness of breath or wheezing. 7. Close follow up at home after discharge 8. Call us with any signs of flare as early as possible 9. Take Zyrtec morning of surgery  Good Luck! Call us if you need [place] .  Please contact office for sooner follow up if symptoms do not improve or worsen or seek emergency care

## 2021-01-08 NOTE — Progress Notes (Signed)
History of Present Illness -Denise [name] is a 75 y.o. female former remote smoker ( Quit [phone] with 1 pack year smoking history) with asthma. She is followed by Dr. Lamonte Sakai.   01/08/2021 Pt. Presents today for pre-op clearance to ensure her asthma is stable and to assess her pre-op risk prior to her surgery for a spinal fusion. She has had this same surgery 4 times in the past. She did not have any issues in the past. With her asthma.  We have discussed that she is at an increased risk for  pulmonary issues due to her underlying pulmonary disease. She understands this. She states she is compliant with her Symbicort twice daily. She is compliant with taking her Zyrtec daily. She has had no wheezing or coughing. No increase in secretions. No dyspnea. She feels her asthma is stable and at baseline. No rescue inhaler use, no night time awakenings.    1) RISK FOR PROLONGED MECHANICAL VENTILAION - > 48h  1A) Arozullah - Prolonged mech ventilation risk Arozullah Postperative Pulmonary Risk Score - for mech ventilation dependence >48h [name], Ann Surg 2000, major non-cardiac surgery) Comment Score  Type of surgery - abd ao aneurysm (27), thoracic (21), neurosurgery / upper abdominal / vascular (21), neck (11) Lumbar surgery and removal of old hardware 21  Emergency Surgery - (11)  0  ALbumin < 3 or poor nutritional state - (9)  0  BUN > 30 -  (8)  15  Partial or completely dependent functional status - (7)  0  COPD -  (6) asthma 3  Age - 60 to 46 (4), > 70  (6)  6  TOTAL    Risk Stratifcation scores  - < 10 (0.5%), 11-19 (1.8%), 20-27 (4.2%), 28-40 (10.1%), >40 (26.6%) 26.6% 42   Pt.has been assessed by cardiology with a heart echo.  She has had a recent CT Chest which shows an enlargement of the right lobe of her thyroid. She has had [place] follow up of this, which shows it is stable.   Pt. Understands that she has an increased risk of pulmonary complications due to her underlying  asthma. She is willing to accept this risk. She has had this surgery 3 times in the past and her asthma did not flare.    Major Pulmonary risks identified in the multifactorial risk analysis are but not limited to a) pneumonia; b) recurrent intubation risk; c) prolonged or recurrent acute respiratory failure needing mechanical ventilation; d) prolonged hospitalization; e) DVT/Pulmonary embolism; f) Acute Pulmonary edema  Recommend 1. Short duration of surgery as much as possible and avoid paralytic if possible 2. Recovery in step down with albuterol nebs post op if any signs of wheezing, and pulmonary consult at first sign of pulmonary complications.  3. DVT prophylaxis 4. Aggressive pulmonary toilet with o2, bronchodilatation, and incentive spirometry and early ambulation with Physical Therapy. 5. Continue Symbicort 2 puffs twice daily , use morning of surgery. 6. Take albuterol as needed for shortness of breath or wheezing. 7. Close follow up at home after discharge. 8. Call us with any signs of flare as early as possible.    Test Results: Echo 11/20/2020 Left Ventricle: Left ventricular ejection fraction, by estimation, is 65%. The left ventricle has normal Patient Name: Denise [name] Date of Exam: 11/20/2020 Medical Rec #: [id number] Height: 61.0 in Accession #: [id number] Weight: 225.8 lb Date of Birth: Sep 03, 1945 BSA: 1.989 m Patient Age: 75 years BP: 128/70 mmHg Patient Gender: F HR: 69  bpm. Exam Location: [address] Procedure: 2D Echo, 3D Echo, Cardiac Doppler and Color Doppler Indications: Z95.2 Status Post TAVR History: Patient has prior history of Echocardiogram examinations, most recent 09/24/2019. Aortic Valve Disease, Signs/Symptoms:Edema and Murmur; Risk Factors:Hypertension, Diabetes, Family History of Coronary Artery Disease, Former Smoker and Dyslipidemia. Asthma, Right Breast Cancer with Radiation, TAVR. Aortic Valve: 23 mm Sapien prosthetic, stented (TAVR)  valve is present in the aortic position. Procedure Date: 09/24/2016. Sonographer: [name] RDCS Referring Phys: [id number] [place] Final Page 1 of 3 function. The left ventricle has no regional wall motion abnormalities. 3D left ventricular ejection fraction analysis performed but not reported based on interpreter judgement due to suboptimal quality. The left ventricular internal cavity size was normal in size. There is no left ventricular hypertrophy. Left ventricular diastolic parameters are consistent with Grade I diastolic dysfunction (impaired relaxation). Right Ventricle: The right ventricular size is normal. No increase in right ventricular wall thickness. Right ventricular systolic function is normal. There is moderately elevated pulmonary artery systolic pressure. The tricuspid regurgitant velocity is 3.27 m/s, and with an assumed right atrial pressure of 15 mmHg, the estimated right ventricular systolic pressure is [id number] mmHg. Left Atrium: Left atrial size was moderately dilated. Right Atrium: Right atrial size was normal in size. Pericardium: There is no evidence of pericardial effusion. Mitral Valve: The mitral valve is degenerative in appearance. Moderate mitral annular calcification. Mild mitral valve regurgitation. No evidence of mitral valve stenosis. The mean mitral valve gradient is 2.4 mmHg with average heart rate of 66 bpm. Tricuspid Valve: The tricuspid valve is normal in structure. Tricuspid valve regurgitation is trivial. No evidence of tricuspid stenosis. Aortic Valve: EOA 1.6 cm2, iEOA 0.8 cm2/m2, DI 0.42, AT 85 msec. The aortic valve has been repaired/replaced. Aortic valve regurgitation is trivial. Aortic regurgitation PHT measures 393 msec. Aortic valve mean gradient measures 19.0 mmHg. Aortic valve peak gradient measures 36.5 mmHg. Aortic valve area, by VTI measures 1.59 cm. There is a 23 mm Sapien prosthetic, stented  (TAVR) valve present in the aortic position. Procedure Date: 09/24/2016. Pulmonic Valve: The pulmonic valve was grossly normal. Pulmonic valve regurgitation is trivial. No evidence of pulmonic stenosis. Aorta: The aortic root is normal in size and structure. Venous: The inferior vena cava is dilated in size with less than 50% respiratory variability, suggesting right atrial pressure of 15 mmHg. IAS/Shunts: No atrial level shunt detected by color flow Doppler. Final Britni  CBC Latest Ref Rng & Units 01/03/2021 10/12/2020 04/11/2020  WBC 4.0 - 10.5 K/uL 8.8 10.2 8.5  Hemoglobin 12.0 - 15.0 g/dL 12.9 13.1 14.4  Hematocrit 36.0 - 46.0 % 39.7 39.3 44.9  Platelets 150 - 400 K/uL 216 201.0 216    BMP Latest Ref Rng & Units 01/03/2021 10/12/2020 04/13/2020  Glucose 70 - 99 mg/dL 204(H) 96 112(H)  BUN 8 - 23 mg/dL '15 18 16  '$ Creatinine 0.44 - 1.00 mg/dL 0.84 0.84 1.02  BUN/Creat Ratio 12 - 28 - - -  Sodium 135 - 145 mmol/L 140 141 139  Potassium 3.5 - 5.1 mmol/L 3.5 3.5 4.0  Chloride 98 - 111 mmol/L 108 103 104  CO2 22 - 32 mmol/L '23 29 28  '$ Calcium 8.9 - 10.3 mg/dL 9.5 9.7 9.6    BNP    Component Value Date/Time   BNP 41.4 09/16/2018 1412    ProBNP    Component Value Date/Time   PROBNP 22.0 01/06/2012 1706    PFT    Component Value  Date/Time   FEV1PRE 1.56 08/29/2016 1007   FEV1POST 1.76 08/29/2016 1007   FVCPRE 1.94 08/29/2016 1007   FVCPOST 2.26 08/29/2016 1007   TLC 4.75 08/29/2016 1007   DLCOUNC 16.36 08/29/2016 1007   PREFEV1FVCRT 81 08/29/2016 1007   PSTFEV1FVCRT 78 08/29/2016 1007    No results found.   Past medical hx Past Medical History:  Diagnosis Date   Abdominal pain, left lower quadrant 09/12/2008   ALLERGIC RHINITIS 08/24/2007   Allergy    Anemia    past hx- not current    ANXIETY 08/24/2007   Aortic stenosis, severe 07/19/2016   ASTHMA 08/24/2007   ASTHMA, WITH ACUTE EXACERBATION 03/14/2008   Breast cancer ([place]) 2017   right- radiation only     Cataract    removed bilat    DDD (degenerative disc disease), lumbar    DEGENERATIVE JOINT DISEASE 08/24/2007   DEPRESSION 08/24/2007   DIABETES MELLITUS, TYPE II 08/24/2007   ECZEMA 08/24/2007   Edema 08/24/2007   GERD 08/24/2007   not current (07/2014)- not current 2019   Heart murmur    HYPERCHOLESTEROLEMIA 08/24/2007   HYPERLIPIDEMIA 08/24/2007   HYPERTENSION 08/24/2007   Neuromuscular disorder (HCC)    numbness in feet - still has feeling   OBESITY 08/24/2007   OSTEOARTHRITIS, KNEES, BILATERAL, 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   Personal history of radiation therapy 2017   right breast ca   POSTMENOPAUSAL STATUS 08/24/2007   Right knee DJD 09/03/2010   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 Her smoking use included cigarettes. She has a 1.00 pack-year smoking history. She has never used smokeless tobacco. She reports that she does not drink alcohol and does not use drugs.  Tobacco Cessation: Former remote smoker Quit 1972   Past surgical hx, Family hx, Social hx all reviewed.  Current Outpatient Medications on File Prior to Visit  Medication Sig   acetaminophen (TYLENOL) 500 MG tablet Take 1,000 mg by mouth every 6 (six) hours as needed for moderate pain.   albuterol (VENTOLIN HFA) 108 (90 Base) MCG/ACT inhaler Inhale 2 puffs into the lungs every 6 (six) hours as needed for wheezing or shortness of breath.   amLODipine (NORVASC) 10 MG  tablet Take 1 tablet (10 mg total) by mouth daily.   anastrozole (ARIMIDEX) 1 MG tablet Take 1 tablet (1 mg total) by mouth daily.   aspirin 81 MG EC tablet Take 81 mg by mouth daily.   atorvastatin (LIPITOR) 40 MG tablet Take 1 tablet (40 mg total) by mouth daily.   budesonide-formoterol (SYMBICORT) 160-4.5 MCG/ACT inhaler Inhale 2 puffs into the lungs 2 (two) times daily.   Calcium Carb-Cholecalciferol (CALCIUM 600 + D PO) Take 1 tablet by mouth daily.   cetirizine (EQ ALLERGY RELIEF, CETIRIZINE,) 10 MG tablet TAKE 1 TABLET BY MOUTH ONCE DAILY AS NEEDED FOR  ALLERGIES (Patient taking differently: Take 10 mg by mouth daily.)   cyclobenzaprine (FLEXERIL) 10 MG tablet Take 10 mg by mouth 3 (three) times daily as needed for muscle spasms.   diclofenac Sodium (VOLTAREN) 1 % GEL Apply 4 g topically 4 (four) times daily. (Patient taking differently: Apply 4 g topically 4 (four) times daily as needed (pain).)   fluticasone (FLONASE) 50 MCG/ACT nasal spray Place 2 sprays into both nostrils daily.   furosemide (LASIX) 40 MG tablet Take 1 tablet (40 mg total) by mouth 2 (two) times daily.   metFORMIN (GLUCOPHAGE) 500 MG tablet TAKE 2 TABLETS BY MOUTH TWICE DAILY WITH A MEAL   Multiple Vitamins-Minerals (ALIVE WOMENS 50+) TABS Take 1 tablet by mouth daily.   Omega-3 Fatty Acids (FISH OIL) 1000 MG CAPS Take 1,000 mg by mouth daily.   Polyvinyl Alcohol-Povidone (REFRESH OP) Place 1 drop into both eyes daily as needed (dry eyes).   potassium chloride (MICRO-K) 10 MEQ CR capsule TAKE 4 CAPSULES BY MOUTH ONCE DAILY   valsartan (DIOVAN) 40 MG tablet Take 0.5 tablets (20 mg total) by mouth as directed. Take 1/2 tablet daily   No current facility-administered medications on file prior to visit.     Allergies  Allergen Reactions   Doxazosin Nausea And Vomiting    Dizziness    Clonidine Hydrochloride Other (See Comments)    Bradycardia   Erythromycin Palpitations   Hydrocodone-Acetaminophen Nausea Only    Pork-Derived Products Diarrhea and Nausea Only   Rosiglitazone Maleate Swelling    SWELLING REACTION UNSPECIFIED     Review Of Systems:  Constitutional:   No  weight loss, night sweats,  Fevers, chills, fatigue, or  lassitude.  HEENT:   No headaches,  Difficulty swallowing,  Tooth/dental problems, or  Sore throat,                No sneezing, itching, ear ache, nasal congestion, post nasal drip,   CV:  No chest pain,  Orthopnea, PND, swelling in lower extremities, anasarca, dizziness, palpitations, syncope.   GI  No heartburn, indigestion, abdominal pain, nausea, vomiting, diarrhea, change in bowel habits, loss of appetite, bloody stools.   Resp: No shortness of breath with exertion or at rest.  No excess mucus, no productive cough,  No non-productive cough,  No coughing up of blood.  No change in color of mucus.  No wheezing.  No chest wall deformity,   Skin: no rash or lesions.  GU: no dysuria, change in color of urine, no urgency or frequency.  No flank pain, no hematuria   MS:  No joint pain or swelling.  + decreased range of motion.  + back pain.  Psych:  No change in mood or affect. No depression or anxiety.  No memory loss.   Vital Signs BP 122/82 (BP Location: Left Arm, Cuff Size: Normal)   Pulse 85   Temp (!) 97.3 F (36.3 C) (Oral)   Ht '5\' 1"'$  (1.549 m)   Wt 221 lb (100.2 kg)   SpO2 99%   BMI 41.76 kg/m    Physical Exam:  General- No distress,  A&O x 3, pleasant ENT: No sinus tenderness, TM clear, pale nasal mucosa, no oral exudate,no post nasal drip, no LAN Cardiac: S1, S2, regular rate and rhythm, no murmur Chest: No wheeze/ rales/ dullness; no accessory muscle use, no nasal flaring, no sternal retractions, diminished per bases Abd.: Soft Non-tender, ND, BS +, Body mass index is 41.76 kg/m.  Ext: No clubbing cyanosis,  edema Neuro:  normal strength, physically debilitated due to her back pain, walks with a cane for balance  Skin: No rashes, warm and dry, no  lesions Psych: normal mood and behavior   Assessment/Plan  Pre-Op Clearance for lumbar spinal fusion 01/12/2021 Plan We will clear you for your Surgery Friday 01/12/2021. You do have an increased risk of pulmonary complications to include  pneumonia; b) recurrent intubation risk; c) prolonged or recurrent acute respiratory failure needing mechanical ventilation; d) prolonged hospitalization; e) DVT/Pulmonary embolism; f) Acute Pulmonary edema. Take your Symbicort and albuterol inhalers to the hospital with you.  Use your Symbicort morning of surgery, and take your Zyrtec morning of surgery. We make these following recommendations   Recommendations: Short duration of surgery as much as possible and avoid paralytic if possible 2. Recovery in step down with albuterol nebs post op if any signs of wheezing 3. DVT prophylaxis 4. Aggressive pulmonary toilet with o2, bronchodilatation, and incentive spirometry and early ambulation with Physical Therapy. 5. Continue Symbicort 2 puffs twice daily , use morning of surgery. 6. Take albuterol as needed for shortness of breath or wheezing. 7. Close follow up at home after discharge 8. Call us with any signs of flare as early as possible 9. Take Zyrtec morning of surgery  Good Luck! Call us if you need [place] .  Please contact office for sooner follow up if symptoms do not improve or worsen or seek emergency care     I spent 35 minutes dedicated to the care of this patient on the date of this encounter to include pre-visit review of records, face-to-face time with the patient discussing conditions above, post visit ordering of testing, clinical documentation with the electronic health record, making appropriate referrals as documented, and communicating necessary information to the patient's healthcare team.    Magdalen Spatz, NP 01/08/2021  12:27 PM

## 2021-01-08 NOTE — Telephone Encounter (Signed)
Ov note with risk assessment 01/08/21 was faxed to Dr Saintclair Halsted at [phone].

## 2021-01-09 ENCOUNTER — Other Ambulatory Visit (HOSPITAL_COMMUNITY)
Admission: RE | Admit: 2021-01-09 | Discharge: 2021-01-09 | Disposition: A | Discharge status: Home / Self Care | Payer: Medicare Other | Source: Ambulatory Visit | Attending: Neurosurgery | Admitting: Neurosurgery

## 2021-01-09 DIAGNOSIS — Z853 Personal history of malignant neoplasm of breast: Secondary | ICD-10-CM | POA: Diagnosis not present

## 2021-01-09 DIAGNOSIS — Z87891 Personal history of nicotine dependence: Secondary | ICD-10-CM | POA: Diagnosis not present

## 2021-01-09 DIAGNOSIS — Z20822 Contact with and (suspected) exposure to covid-19: Secondary | ICD-10-CM | POA: Diagnosis not present

## 2021-01-09 DIAGNOSIS — Z472 Encounter for removal of internal fixation device: Secondary | ICD-10-CM | POA: Diagnosis not present

## 2021-01-09 DIAGNOSIS — Z885 Allergy status to narcotic agent status: Secondary | ICD-10-CM | POA: Diagnosis not present

## 2021-01-09 DIAGNOSIS — Z7951 Long term (current) use of inhaled steroids: Secondary | ICD-10-CM | POA: Diagnosis not present

## 2021-01-09 DIAGNOSIS — M48061 Spinal stenosis, lumbar region without neurogenic claudication: Secondary | ICD-10-CM | POA: Diagnosis not present

## 2021-01-09 DIAGNOSIS — Z823 Family history of stroke: Secondary | ICD-10-CM | POA: Diagnosis not present

## 2021-01-09 DIAGNOSIS — Z8249 Family history of ischemic heart disease and other diseases of the circulatory system: Secondary | ICD-10-CM | POA: Diagnosis not present

## 2021-01-09 DIAGNOSIS — J45909 Unspecified asthma, uncomplicated: Secondary | ICD-10-CM | POA: Diagnosis not present

## 2021-01-09 DIAGNOSIS — Z01812 Encounter for preprocedural laboratory examination: Secondary | ICD-10-CM | POA: Insufficient documentation

## 2021-01-09 DIAGNOSIS — M5116 Intervertebral disc disorders with radiculopathy, lumbar region: Secondary | ICD-10-CM | POA: Diagnosis not present

## 2021-01-09 DIAGNOSIS — E119 Type 2 diabetes mellitus without complications: Secondary | ICD-10-CM | POA: Diagnosis not present

## 2021-01-09 DIAGNOSIS — M48062 Spinal stenosis, lumbar region with neurogenic claudication: Secondary | ICD-10-CM | POA: Diagnosis not present

## 2021-01-09 DIAGNOSIS — M4326 Fusion of spine, lumbar region: Secondary | ICD-10-CM | POA: Diagnosis not present

## 2021-01-09 DIAGNOSIS — Z888 Allergy status to other drugs, medicaments and biological substances status: Secondary | ICD-10-CM | POA: Diagnosis not present

## 2021-01-09 DIAGNOSIS — Z8371 Family history of colonic polyps: Secondary | ICD-10-CM | POA: Diagnosis not present

## 2021-01-09 DIAGNOSIS — Z923 Personal history of irradiation: Secondary | ICD-10-CM | POA: Diagnosis not present

## 2021-01-09 DIAGNOSIS — Z7984 Long term (current) use of oral hypoglycemic drugs: Secondary | ICD-10-CM | POA: Diagnosis not present

## 2021-01-09 DIAGNOSIS — Z6841 Body Mass Index (BMI) 40.0 and over, adult: Secondary | ICD-10-CM | POA: Diagnosis not present

## 2021-01-09 DIAGNOSIS — M532X6 Spinal instabilities, lumbar region: Secondary | ICD-10-CM | POA: Diagnosis not present

## 2021-01-09 DIAGNOSIS — Z833 Family history of diabetes mellitus: Secondary | ICD-10-CM | POA: Diagnosis not present

## 2021-01-09 DIAGNOSIS — Z91014 Allergy to mammalian meats: Secondary | ICD-10-CM | POA: Diagnosis not present

## 2021-01-09 DIAGNOSIS — Z79899 Other long term (current) drug therapy: Secondary | ICD-10-CM | POA: Diagnosis not present

## 2021-01-09 DIAGNOSIS — E669 Obesity, unspecified: Secondary | ICD-10-CM | POA: Diagnosis not present

## 2021-01-09 DIAGNOSIS — Z7982 Long term (current) use of aspirin: Secondary | ICD-10-CM | POA: Diagnosis not present

## 2021-01-09 DIAGNOSIS — Z981 Arthrodesis status: Secondary | ICD-10-CM | POA: Diagnosis not present

## 2021-01-09 DIAGNOSIS — Z881 Allergy status to other antibiotic agents status: Secondary | ICD-10-CM | POA: Diagnosis not present

## 2021-01-09 LAB — SARS CORONAVIRUS 2 (TAT 6-24 HRS): SARS Coronavirus 2: NEGATIVE

## 2021-01-12 ENCOUNTER — Inpatient Hospital Stay (HOSPITAL_COMMUNITY): Payer: Medicare Other | Admitting: Emergency Medicine

## 2021-01-12 ENCOUNTER — Other Ambulatory Visit: Payer: Self-pay

## 2021-01-12 ENCOUNTER — Encounter (HOSPITAL_COMMUNITY): Payer: Self-pay | Admitting: Neurosurgery

## 2021-01-12 ENCOUNTER — Inpatient Hospital Stay (HOSPITAL_COMMUNITY): Payer: Medicare Other

## 2021-01-12 ENCOUNTER — Encounter (HOSPITAL_COMMUNITY)
Admission: RE | Disposition: A | Discharge status: Home / Self Care | Payer: Self-pay | Source: Ambulatory Visit | Attending: Neurosurgery

## 2021-01-12 ENCOUNTER — Inpatient Hospital Stay (HOSPITAL_COMMUNITY)
Admission: RE | Admit: 2021-01-12 | Discharge: 2021-01-13 | DRG: 454 | Disposition: A | Discharge status: Home / Self Care | Payer: Medicare Other | Attending: Neurosurgery | Admitting: Neurosurgery

## 2021-01-12 ENCOUNTER — Inpatient Hospital Stay (HOSPITAL_COMMUNITY): Payer: Medicare Other | Admitting: Certified Registered Nurse Anesthetist

## 2021-01-12 DIAGNOSIS — E119 Type 2 diabetes mellitus without complications: Secondary | ICD-10-CM | POA: Diagnosis present

## 2021-01-12 DIAGNOSIS — Z87891 Personal history of nicotine dependence: Secondary | ICD-10-CM | POA: Diagnosis not present

## 2021-01-12 DIAGNOSIS — M5116 Intervertebral disc disorders with radiculopathy, lumbar region: Secondary | ICD-10-CM | POA: Diagnosis present

## 2021-01-12 DIAGNOSIS — E669 Obesity, unspecified: Secondary | ICD-10-CM | POA: Diagnosis present

## 2021-01-12 DIAGNOSIS — Z881 Allergy status to other antibiotic agents status: Secondary | ICD-10-CM | POA: Diagnosis not present

## 2021-01-12 DIAGNOSIS — Z8249 Family history of ischemic heart disease and other diseases of the circulatory system: Secondary | ICD-10-CM | POA: Diagnosis not present

## 2021-01-12 DIAGNOSIS — Z91014 Allergy to mammalian meats: Secondary | ICD-10-CM

## 2021-01-12 DIAGNOSIS — Z20822 Contact with and (suspected) exposure to covid-19: Secondary | ICD-10-CM | POA: Diagnosis present

## 2021-01-12 DIAGNOSIS — Z823 Family history of stroke: Secondary | ICD-10-CM

## 2021-01-12 DIAGNOSIS — Z981 Arthrodesis status: Secondary | ICD-10-CM

## 2021-01-12 DIAGNOSIS — M48061 Spinal stenosis, lumbar region without neurogenic claudication: Secondary | ICD-10-CM | POA: Diagnosis present

## 2021-01-12 DIAGNOSIS — Z833 Family history of diabetes mellitus: Secondary | ICD-10-CM | POA: Diagnosis not present

## 2021-01-12 DIAGNOSIS — M532X6 Spinal instabilities, lumbar region: Secondary | ICD-10-CM | POA: Diagnosis present

## 2021-01-12 DIAGNOSIS — Z888 Allergy status to other drugs, medicaments and biological substances status: Secondary | ICD-10-CM

## 2021-01-12 DIAGNOSIS — Z7951 Long term (current) use of inhaled steroids: Secondary | ICD-10-CM

## 2021-01-12 DIAGNOSIS — J45909 Unspecified asthma, uncomplicated: Secondary | ICD-10-CM | POA: Diagnosis present

## 2021-01-12 DIAGNOSIS — Z6841 Body Mass Index (BMI) 40.0 and over, adult: Secondary | ICD-10-CM | POA: Diagnosis not present

## 2021-01-12 DIAGNOSIS — Z885 Allergy status to narcotic agent status: Secondary | ICD-10-CM | POA: Diagnosis not present

## 2021-01-12 DIAGNOSIS — Z7982 Long term (current) use of aspirin: Secondary | ICD-10-CM | POA: Diagnosis not present

## 2021-01-12 DIAGNOSIS — Z923 Personal history of irradiation: Secondary | ICD-10-CM | POA: Diagnosis not present

## 2021-01-12 DIAGNOSIS — Z419 Encounter for procedure for purposes other than remedying health state, unspecified: Secondary | ICD-10-CM

## 2021-01-12 DIAGNOSIS — Z8371 Family history of colonic polyps: Secondary | ICD-10-CM

## 2021-01-12 DIAGNOSIS — Z472 Encounter for removal of internal fixation device: Secondary | ICD-10-CM | POA: Diagnosis not present

## 2021-01-12 DIAGNOSIS — Z7984 Long term (current) use of oral hypoglycemic drugs: Secondary | ICD-10-CM

## 2021-01-12 DIAGNOSIS — Z79899 Other long term (current) drug therapy: Secondary | ICD-10-CM

## 2021-01-12 DIAGNOSIS — M4326 Fusion of spine, lumbar region: Secondary | ICD-10-CM | POA: Diagnosis not present

## 2021-01-12 DIAGNOSIS — Z853 Personal history of malignant neoplasm of breast: Secondary | ICD-10-CM | POA: Diagnosis not present

## 2021-01-12 DIAGNOSIS — M48062 Spinal stenosis, lumbar region with neurogenic claudication: Secondary | ICD-10-CM | POA: Diagnosis not present

## 2021-01-12 LAB — GLUCOSE, CAPILLARY
Glucose-Capillary: 103 mg/dL — ABNORMAL HIGH (ref 70–99)
Glucose-Capillary: 121 mg/dL — ABNORMAL HIGH (ref 70–99)
Glucose-Capillary: 148 mg/dL — ABNORMAL HIGH (ref 70–99)
Glucose-Capillary: 139 mg/dL — ABNORMAL HIGH (ref 70–99)

## 2021-01-12 SURGERY — POSTERIOR LUMBAR FUSION 1 WITH HARDWARE REMOVAL
Anesthesia: General | Site: Back

## 2021-01-12 MED ORDER — THROMBIN 5000 UNITS EX SOLR
CUTANEOUS | Status: AC
  Filled 2021-01-12: qty 5000

## 2021-01-12 MED ORDER — MOMETASONE FURO-FORMOTEROL FUM 200-5 MCG/ACT IN AERO
2.0000 | INHALATION_SPRAY | Freq: Two times a day (BID) | RESPIRATORY_TRACT | Status: DC
  Administered 2021-01-12: 2 via RESPIRATORY_TRACT
  Filled 2021-01-12: qty 8.8

## 2021-01-12 MED ORDER — ALUM & MAG HYDROXIDE-SIMETH 200-200-20 MG/5ML PO SUSP: 30.0000 mL | Freq: Four times a day (QID) | ORAL | Status: DC | PRN

## 2021-01-12 MED ORDER — PROPOFOL 10 MG/ML IV BOLUS
INTRAVENOUS | Status: AC
  Filled 2021-01-12: qty 20

## 2021-01-12 MED ORDER — HYDROMORPHONE HCL 1 MG/ML IJ SOLN
INTRAMUSCULAR | Status: AC
  Filled 2021-01-12: qty 0.5

## 2021-01-12 MED ORDER — ALBUTEROL SULFATE (2.5 MG/3ML) 0.083% IN NEBU: 2.5000 mg | INHALATION_SOLUTION | Freq: Four times a day (QID) | RESPIRATORY_TRACT | Status: DC | PRN

## 2021-01-12 MED ORDER — METFORMIN HCL 500 MG PO TABS
1000.0000 mg | ORAL_TABLET | Freq: Two times a day (BID) | ORAL | Status: DC
  Administered 2021-01-12 – 2021-01-13 (×2): 1000 mg via ORAL
  Filled 2021-01-12 (×2): qty 2

## 2021-01-12 MED ORDER — HYDROMORPHONE HCL 1 MG/ML IJ SOLN
0.5000 mg | INTRAMUSCULAR | Status: DC | PRN
  Administered 2021-01-12: 0.5 mg via INTRAVENOUS
  Filled 2021-01-12: qty 0.5

## 2021-01-12 MED ORDER — MENTHOL 3 MG MT LOZG: 1.0000 | LOZENGE | OROMUCOSAL | Status: DC | PRN

## 2021-01-12 MED ORDER — CHLORHEXIDINE GLUCONATE 0.12 % MT SOLN
15.0000 mL | Freq: Once | OROMUCOSAL | Status: AC
  Administered 2021-01-12: 15 mL via OROMUCOSAL
  Filled 2021-01-12: qty 15

## 2021-01-12 MED ORDER — ONDANSETRON HCL 4 MG PO TABS: 4.0000 mg | ORAL_TABLET | Freq: Four times a day (QID) | ORAL | Status: DC | PRN

## 2021-01-12 MED ORDER — 0.9 % SODIUM CHLORIDE (POUR BTL) OPTIME
TOPICAL | Status: DC | PRN
  Administered 2021-01-12: 1000 mL

## 2021-01-12 MED ORDER — PHENOL 1.4 % MT LIQD: 1.0000 | OROMUCOSAL | Status: DC | PRN

## 2021-01-12 MED ORDER — SODIUM CHLORIDE 0.9% FLUSH: 3.0000 mL | INTRAVENOUS | Status: DC | PRN

## 2021-01-12 MED ORDER — ONDANSETRON HCL 4 MG/2ML IJ SOLN: INTRAMUSCULAR | Status: DC | PRN

## 2021-01-12 MED ORDER — ASPIRIN EC 81 MG PO TBEC: 81.0000 mg | DELAYED_RELEASE_TABLET | Freq: Every day | ORAL | Status: DC

## 2021-01-12 MED ORDER — CEFAZOLIN SODIUM-DEXTROSE 2-4 GM/100ML-% IV SOLN
2.0000 g | Freq: Three times a day (TID) | INTRAVENOUS | Status: DC
  Administered 2021-01-12 (×2): 2 g via INTRAVENOUS
  Filled 2021-01-12 (×2): qty 100

## 2021-01-12 MED ORDER — PANTOPRAZOLE SODIUM 40 MG PO TBEC
40.0000 mg | DELAYED_RELEASE_TABLET | Freq: Every day | ORAL | Status: DC
  Administered 2021-01-12: 40 mg via ORAL
  Filled 2021-01-12: qty 1

## 2021-01-12 MED ORDER — LIDOCAINE-EPINEPHRINE 1 %-1:100000 IJ SOLN
INTRAMUSCULAR | Status: AC
  Filled 2021-01-12: qty 1

## 2021-01-12 MED ORDER — ONDANSETRON HCL 4 MG/2ML IJ SOLN
INTRAMUSCULAR | Status: AC
  Filled 2021-01-12: qty 2

## 2021-01-12 MED ORDER — BUPIVACAINE LIPOSOME 1.3 % IJ SUSP
INTRAMUSCULAR | Status: DC | PRN
  Administered 2021-01-12: 20 mL

## 2021-01-12 MED ORDER — ONDANSETRON HCL 4 MG/2ML IJ SOLN
4.0000 mg | Freq: Four times a day (QID) | INTRAMUSCULAR | Status: DC | PRN
Start: 2021-01-12 — End: 2021-01-12

## 2021-01-12 MED ORDER — HYDROMORPHONE HCL 1 MG/ML IJ SOLN
INTRAMUSCULAR | Status: DC | PRN
  Administered 2021-01-12: .5 mg via INTRAVENOUS

## 2021-01-12 MED ORDER — ACETAMINOPHEN 325 MG PO TABS
650.0000 mg | ORAL_TABLET | ORAL | Status: DC | PRN
  Administered 2021-01-12 – 2021-01-13 (×2): 650 mg via ORAL
  Filled 2021-01-12 (×2): qty 2

## 2021-01-12 MED ORDER — SODIUM CHLORIDE 0.9% FLUSH
3.0000 mL | Freq: Two times a day (BID) | INTRAVENOUS | Status: DC
  Administered 2021-01-12: 3 mL via INTRAVENOUS

## 2021-01-12 MED ORDER — ANASTROZOLE 1 MG PO TABS
1.0000 mg | ORAL_TABLET | Freq: Every day | ORAL | Status: DC
  Filled 2021-01-12: qty 1

## 2021-01-12 MED ORDER — DEXAMETHASONE SODIUM PHOSPHATE 10 MG/ML IJ SOLN
10.0000 mg | Freq: Once | INTRAMUSCULAR | Status: AC
  Administered 2021-01-12: 10 mg via INTRAVENOUS
  Filled 2021-01-12: qty 1

## 2021-01-12 MED ORDER — THROMBIN 20000 UNITS EX SOLR: CUTANEOUS | Status: DC | PRN

## 2021-01-12 MED ORDER — FENTANYL CITRATE (PF) 100 MCG/2ML IJ SOLN
INTRAMUSCULAR | Status: AC
  Filled 2021-01-12: qty 2

## 2021-01-12 MED ORDER — ALBUTEROL SULFATE HFA 108 (90 BASE) MCG/ACT IN AERS: 2.0000 | INHALATION_SPRAY | Freq: Four times a day (QID) | RESPIRATORY_TRACT | Status: DC | PRN

## 2021-01-12 MED ORDER — BUPIVACAINE LIPOSOME 1.3 % IJ SUSP
INTRAMUSCULAR | Status: AC
  Filled 2021-01-12: qty 20

## 2021-01-12 MED ORDER — ALBUMIN HUMAN 5 % IV SOLN: INTRAVENOUS | Status: DC | PRN

## 2021-01-12 MED ORDER — LIDOCAINE 2% (20 MG/ML) 5 ML SYRINGE
INTRAMUSCULAR | Status: DC | PRN
  Administered 2021-01-12: 40 mg via INTRAVENOUS

## 2021-01-12 MED ORDER — ROCURONIUM BROMIDE 10 MG/ML (PF) SYRINGE
PREFILLED_SYRINGE | INTRAVENOUS | Status: AC
  Filled 2021-01-12: qty 20

## 2021-01-12 MED ORDER — OXYCODONE HCL 5 MG PO TABS
10.0000 mg | ORAL_TABLET | ORAL | Status: DC | PRN
Start: 2021-01-12 — End: 2021-01-13
  Administered 2021-01-12 – 2021-01-13 (×4): 10 mg via ORAL
  Filled 2021-01-12 (×4): qty 2

## 2021-01-12 MED ORDER — CYCLOBENZAPRINE HCL 10 MG PO TABS: 10.0000 mg | ORAL_TABLET | Freq: Three times a day (TID) | ORAL | Status: DC | PRN

## 2021-01-12 MED ORDER — BUPIVACAINE HCL (PF) 0.25 % IJ SOLN
INTRAMUSCULAR | Status: AC
  Filled 2021-01-12: qty 30

## 2021-01-12 MED ORDER — ROCURONIUM BROMIDE 10 MG/ML (PF) SYRINGE
PREFILLED_SYRINGE | INTRAVENOUS | Status: DC | PRN
  Administered 2021-01-12: 60 mg via INTRAVENOUS
  Administered 2021-01-12: 30 mg via INTRAVENOUS
  Administered 2021-01-12: 20 mg via INTRAVENOUS

## 2021-01-12 MED ORDER — INSULIN ASPART 100 UNIT/ML IJ SOLN
0.0000 [IU] | Freq: Three times a day (TID) | INTRAMUSCULAR | Status: DC
  Administered 2021-01-12: 3 [IU] via SUBCUTANEOUS

## 2021-01-12 MED ORDER — OXYCODONE HCL 5 MG PO TABS: 5.0000 mg | ORAL_TABLET | Freq: Once | ORAL | Status: DC | PRN

## 2021-01-12 MED ORDER — PHENYLEPHRINE HCL-NACL 20-0.9 MG/250ML-% IV SOLN
INTRAVENOUS | Status: DC | PRN
  Administered 2021-01-12: 40 ug/min via INTRAVENOUS

## 2021-01-12 MED ORDER — LORATADINE 10 MG PO TABS: 10.0000 mg | ORAL_TABLET | Freq: Every day | ORAL | Status: DC

## 2021-01-12 MED ORDER — FUROSEMIDE 40 MG PO TABS
40.0000 mg | ORAL_TABLET | Freq: Two times a day (BID) | ORAL | Status: DC
  Administered 2021-01-12 – 2021-01-13 (×2): 40 mg via ORAL
  Filled 2021-01-12 (×2): qty 1

## 2021-01-12 MED ORDER — ACETAMINOPHEN 10 MG/ML IV SOLN
INTRAVENOUS | Status: DC | PRN
  Administered 2021-01-12: 1000 mg via INTRAVENOUS

## 2021-01-12 MED ORDER — ARTIFICIAL TEARS OPHTHALMIC OINT
TOPICAL_OINTMENT | OPHTHALMIC | Status: AC
  Filled 2021-01-12: qty 3.5

## 2021-01-12 MED ORDER — ADULT MULTIVITAMIN W/MINERALS CH: 1.0000 | ORAL_TABLET | Freq: Every day | ORAL | Status: DC

## 2021-01-12 MED ORDER — FENTANYL CITRATE (PF) 250 MCG/5ML IJ SOLN
INTRAMUSCULAR | Status: AC
  Filled 2021-01-12: qty 5

## 2021-01-12 MED ORDER — PHENYLEPHRINE 40 MCG/ML (10ML) SYRINGE FOR IV PUSH (FOR BLOOD PRESSURE SUPPORT)
PREFILLED_SYRINGE | INTRAVENOUS | Status: DC | PRN
  Administered 2021-01-12: 40 ug via INTRAVENOUS
  Administered 2021-01-12 (×3): 120 ug via INTRAVENOUS

## 2021-01-12 MED ORDER — FENTANYL CITRATE (PF) 100 MCG/2ML IJ SOLN
25.0000 ug | INTRAMUSCULAR | Status: DC | PRN
  Administered 2021-01-12 (×2): 25 ug via INTRAVENOUS
  Administered 2021-01-12: 50 ug via INTRAVENOUS

## 2021-01-12 MED ORDER — LIDOCAINE 2% (20 MG/ML) 5 ML SYRINGE
INTRAMUSCULAR | Status: AC
  Filled 2021-01-12: qty 10

## 2021-01-12 MED ORDER — FLUTICASONE PROPIONATE 50 MCG/ACT NA SUSP
2.0000 | Freq: Every day | NASAL | Status: DC
  Filled 2021-01-12: qty 16

## 2021-01-12 MED ORDER — ONDANSETRON HCL 4 MG/2ML IJ SOLN
INTRAMUSCULAR | Status: DC | PRN
  Administered 2021-01-12: 4 mg via INTRAVENOUS

## 2021-01-12 MED ORDER — OXYCODONE HCL 5 MG/5ML PO SOLN: 5.0000 mg | Freq: Once | ORAL | Status: DC | PRN

## 2021-01-12 MED ORDER — CHLORHEXIDINE GLUCONATE CLOTH 2 % EX PADS: 6.0000 | MEDICATED_PAD | Freq: Once | CUTANEOUS | Status: DC

## 2021-01-12 MED ORDER — PANTOPRAZOLE SODIUM 40 MG IV SOLR: 40.0000 mg | Freq: Every day | INTRAVENOUS | Status: DC

## 2021-01-12 MED ORDER — PROPOFOL 10 MG/ML IV BOLUS
INTRAVENOUS | Status: DC | PRN
  Administered 2021-01-12: 100 mg via INTRAVENOUS

## 2021-01-12 MED ORDER — POTASSIUM CHLORIDE CRYS ER 20 MEQ PO TBCR: 40.0000 meq | EXTENDED_RELEASE_TABLET | Freq: Every day | ORAL | Status: DC

## 2021-01-12 MED ORDER — ALIVE WOMENS 50+ PO TABS: 1.0000 | ORAL_TABLET | Freq: Every day | ORAL | Status: DC

## 2021-01-12 MED ORDER — ARTIFICIAL TEARS OPHTHALMIC OINT
TOPICAL_OINTMENT | OPHTHALMIC | Status: DC | PRN
  Administered 2021-01-12: 1 via OPHTHALMIC

## 2021-01-12 MED ORDER — SODIUM CHLORIDE 0.9 % IV SOLN
250.0000 mL | INTRAVENOUS | Status: DC
  Administered 2021-01-12: 250 mL via INTRAVENOUS

## 2021-01-12 MED ORDER — SUGAMMADEX SODIUM 200 MG/2ML IV SOLN
INTRAVENOUS | Status: DC | PRN
  Administered 2021-01-12: 100 mg via INTRAVENOUS
  Administered 2021-01-12: 200 mg via INTRAVENOUS

## 2021-01-12 MED ORDER — ATORVASTATIN CALCIUM 40 MG PO TABS: 40.0000 mg | ORAL_TABLET | Freq: Every day | ORAL | Status: DC

## 2021-01-12 MED ORDER — ORAL CARE MOUTH RINSE: 15.0000 mL | Freq: Once | OROMUCOSAL | Status: AC

## 2021-01-12 MED ORDER — ONDANSETRON HCL 4 MG/2ML IJ SOLN: 4.0000 mg | Freq: Four times a day (QID) | INTRAMUSCULAR | Status: DC | PRN

## 2021-01-12 MED ORDER — AMLODIPINE BESYLATE 5 MG PO TABS: 10.0000 mg | ORAL_TABLET | Freq: Every day | ORAL | Status: DC

## 2021-01-12 MED ORDER — HYDROXYZINE HCL 50 MG/ML IM SOLN
50.0000 mg | Freq: Four times a day (QID) | INTRAMUSCULAR | Status: DC | PRN
  Administered 2021-01-12: 50 mg via INTRAMUSCULAR
  Filled 2021-01-12: qty 1

## 2021-01-12 MED ORDER — LACTATED RINGERS IV SOLN: INTRAVENOUS | Status: DC

## 2021-01-12 MED ORDER — CEFAZOLIN SODIUM-DEXTROSE 2-4 GM/100ML-% IV SOLN
2.0000 g | INTRAVENOUS | Status: AC
  Administered 2021-01-12: 2 g via INTRAVENOUS
  Filled 2021-01-12: qty 100

## 2021-01-12 MED ORDER — FENTANYL CITRATE (PF) 250 MCG/5ML IJ SOLN
INTRAMUSCULAR | Status: DC | PRN
  Administered 2021-01-12 (×2): 50 ug via INTRAVENOUS
  Administered 2021-01-12: 100 ug via INTRAVENOUS
  Administered 2021-01-12: 50 ug via INTRAVENOUS

## 2021-01-12 MED ORDER — IRBESARTAN 75 MG PO TABS
37.5000 mg | ORAL_TABLET | Freq: Every day | ORAL | Status: DC
  Filled 2021-01-12: qty 0.5

## 2021-01-12 MED ORDER — THROMBIN 20000 UNITS EX SOLR
CUTANEOUS | Status: AC
  Filled 2021-01-12: qty 20000

## 2021-01-12 MED ORDER — ACETAMINOPHEN 650 MG RE SUPP: 650.0000 mg | RECTAL | Status: DC | PRN

## 2021-01-12 MED ORDER — ACETAMINOPHEN 500 MG PO TABS: 1000.0000 mg | ORAL_TABLET | Freq: Four times a day (QID) | ORAL | Status: DC | PRN

## 2021-01-12 MED ORDER — LIDOCAINE-EPINEPHRINE 1 %-1:100000 IJ SOLN
INTRAMUSCULAR | Status: DC | PRN
  Administered 2021-01-12: 10 mL

## 2021-01-12 SURGICAL SUPPLY — 67 items
BAG COUNTER SPONGE SURGICOUNT (BAG) ×2 IMPLANT
BASKET BONE COLLECTION (BASKET) ×2 IMPLANT
BENZOIN TINCTURE PRP APPL 2/3 (GAUZE/BANDAGES/DRESSINGS) ×2 IMPLANT
BLADE CLIPPER SURG (BLADE) IMPLANT
BLADE SURG 11 STRL SS (BLADE) ×2 IMPLANT
BONE VIVIGEN FORMABLE 5.4CC (Bone Implant) ×2 IMPLANT
BUR CUTTER 7.0 ROUND (BURR) ×2 IMPLANT
BUR MATCHSTICK NEURO 3.0 LAGG (BURR) ×2 IMPLANT
CANISTER SUCT 3000ML PPV (MISCELLANEOUS) ×2 IMPLANT
CARTRIDGE OIL MAESTRO DRILL (MISCELLANEOUS) ×1 IMPLANT
CNTNR URN SCR LID CUP LEK RST (MISCELLANEOUS) ×1 IMPLANT
CONT SPEC 4OZ STRL OR WHT (MISCELLANEOUS) ×2
COVER BACK TABLE 60X90IN (DRAPES) ×2 IMPLANT
DECANTER SPIKE VIAL GLASS SM (MISCELLANEOUS) ×2 IMPLANT
DERMABOND ADVANCED (GAUZE/BANDAGES/DRESSINGS) ×1
DERMABOND ADVANCED .7 DNX12 (GAUZE/BANDAGES/DRESSINGS) ×1 IMPLANT
DIFFUSER DRILL AIR PNEUMATIC (MISCELLANEOUS) ×2 IMPLANT
DRAPE C-ARM 42X72 X-RAY (DRAPES) ×4 IMPLANT
DRAPE C-ARMOR (DRAPES) IMPLANT
DRAPE HALF SHEET 40X57 (DRAPES) IMPLANT
DRAPE LAPAROTOMY 100X72X124 (DRAPES) ×2 IMPLANT
DRAPE SURG 17X23 STRL (DRAPES) ×2 IMPLANT
DRSG OPSITE 4X5.5 SM (GAUZE/BANDAGES/DRESSINGS) ×2 IMPLANT
DRSG OPSITE POSTOP 4X6 (GAUZE/BANDAGES/DRESSINGS) ×2 IMPLANT
DURAPREP 26ML APPLICATOR (WOUND CARE) ×2 IMPLANT
ELECT REM PT RETURN 9FT ADLT (ELECTROSURGICAL) ×2
ELECTRODE REM PT RTRN 9FT ADLT (ELECTROSURGICAL) ×1 IMPLANT
EVACUATOR 3/16  PVC DRAIN (DRAIN) ×2
EVACUATOR 3/16 PVC DRAIN (DRAIN) ×1 IMPLANT
GAUZE 4X4 16PLY ~~LOC~~+RFID DBL (SPONGE) IMPLANT
GAUZE SPONGE 4X4 12PLY STRL (GAUZE/BANDAGES/DRESSINGS) ×2 IMPLANT
GLOVE EXAM NITRILE XL STR (GLOVE) IMPLANT
GLOVE SURG ENC MOIS LTX SZ7 (GLOVE) IMPLANT
GLOVE SURG ENC MOIS LTX SZ8 (GLOVE) ×4 IMPLANT
GLOVE SURG UNDER LTX SZ8.5 (GLOVE) ×4 IMPLANT
GLOVE SURG UNDER POLY LF SZ7 (GLOVE) IMPLANT
GOWN STRL REUS W/ TWL LRG LVL3 (GOWN DISPOSABLE) IMPLANT
GOWN STRL REUS W/ TWL XL LVL3 (GOWN DISPOSABLE) ×2 IMPLANT
GOWN STRL REUS W/TWL 2XL LVL3 (GOWN DISPOSABLE) IMPLANT
GOWN STRL REUS W/TWL LRG LVL3 (GOWN DISPOSABLE)
GOWN STRL REUS W/TWL XL LVL3 (GOWN DISPOSABLE) ×4
GRAFT BONE PROTEIOS LRG 5CC (Orthopedic Implant) ×2 IMPLANT
KIT BASIN OR (CUSTOM PROCEDURE TRAY) ×2 IMPLANT
KIT TURNOVER KIT B (KITS) ×2 IMPLANT
MILL MEDIUM DISP (BLADE) ×2 IMPLANT
NEEDLE HYPO 21X1.5 SAFETY (NEEDLE) ×2 IMPLANT
NEEDLE HYPO 25X1 1.5 SAFETY (NEEDLE) ×2 IMPLANT
NS IRRIG 1000ML POUR BTL (IV SOLUTION) ×2 IMPLANT
OIL CARTRIDGE MAESTRO DRILL (MISCELLANEOUS) ×2
PACK LAMINECTOMY NEURO (CUSTOM PROCEDURE TRAY) ×2 IMPLANT
PAD ARMBOARD 7.5X6 YLW CONV (MISCELLANEOUS) ×6 IMPLANT
ROD PREBENT 6.35X70 (Rod) ×4 IMPLANT
SCREW PEDICLE VA L635 6.5X45M (Screw) ×4 IMPLANT
SCREW SET BREAK OFF (Screw) ×12 IMPLANT
SPACER SUSTAIN RT TI 8X22X11 8 (Spacer) ×4 IMPLANT
SPONGE SURGIFOAM ABS GEL 100 (HEMOSTASIS) ×2 IMPLANT
SPONGE T-LAP 4X18 ~~LOC~~+RFID (SPONGE) IMPLANT
STRIP CLOSURE SKIN 1/2X4 (GAUZE/BANDAGES/DRESSINGS) ×4 IMPLANT
SUT VIC AB 0 CT1 18XCR BRD8 (SUTURE) ×2 IMPLANT
SUT VIC AB 0 CT1 8-18 (SUTURE) ×4
SUT VIC AB 2-0 CT1 18 (SUTURE) ×2 IMPLANT
SUT VIC AB 4-0 PS2 27 (SUTURE) ×2 IMPLANT
SYR 20ML LL LF (SYRINGE) IMPLANT
TOWEL GREEN STERILE (TOWEL DISPOSABLE) ×2 IMPLANT
TOWEL GREEN STERILE FF (TOWEL DISPOSABLE) ×2 IMPLANT
TRAY FOLEY MTR SLVR 16FR STAT (SET/KITS/TRAYS/PACK) ×2 IMPLANT
WATER STERILE IRR 1000ML POUR (IV SOLUTION) ×2 IMPLANT

## 2021-01-12 NOTE — Op Note (Signed)
Preoperative diagnosis: Lumbar degenerative disc disease lumbar spinal stenosis and instability L1-L2 with bilateral L1-L2 radiculopathies and claudication  Postoperative diagnosis: Same  Procedure: Reexploration fusion removal of hardware L2-3 with removal of bilateral 6.35 Legacy knots and rods.  2.  Decompressive lumbar laminectomy L1-L2 in excess and requiring more work than would be needed with a standard interbody fusion including complete medial facetectomies and radical laminotomies of the L1 and L2 nerve roots  3.  Posterior lumbar interbody fusion utilizing the globus titanium insert and rotate cages packed with locally harvested autograft mixed with vivigen and partialis  4.  Pedicle screw fixation L1 L3 with placement of new bilateral L1 pedicle screws utilizing the 6.35 Legacy pedicle screw system and the pre-existing L2 and L3 pedicle screws  5.  Posterior lateral arthrodesis L1-L2 utilizing locally harvested autograft mix  Surgeon: Dominica Severin Vernis [place]  Assistant: Nash Shearer  Anesthesia: General  EBL: Minimal  HPI: 75 year old female progressive worsening back bilateral hip and leg pain rating down L1-L2 nerve root pattern work-up revealed segmental degeneration above her previous fusion from L2-S1 with disc herniation and marked facet arthropathy.  Due to patient's progression of clinical syndrome imaging findings and failed conservative treatment I recommended decompression stabilization procedure at L1-L2 tying into a pre-existing construct at L2-3.  I extensively went over the risks and benefits of the operation with her as well as perioperative course expectations of outcome and alternatives of surgery and she understands and agrees to proceed forward.  Operative procedure: Patient was brought into the OR was [place] general anesthesia positioned prone the Wilson frame her back was prepped and draped in routine sterile fashion.  Her old incision was opened up and extended slightly  cephalad subperiosteal dissection was carried out lamina of T12 and L1 and then exposed the hardware at L2-3.  I exposed the TPs at L1 bilaterally remove the spinous process at L1 drilled down the facet joints and thinned out the lamina capturing the bone shavings and mucus trap.  Then central decompression was begun complete medial facetectomies were performed scar tissue was dissected off of the inferior facet joints and then aggressive under biting of the supra reticulating facet and unroofing of the L2 foramen below and through the pars allowed complete decompression of L1 and L2 nerve roots bilaterally.  I also allowed access to the lateral margin of the disc base.  Epidural veins coagulated the space was incised several large herniations were removed and central compartment endplates were prepared in the utilizing sequential distraction under fluoroscopy cages were inserted in the disc base they were 8 mm wide 11 mm tall with degrees lordosis 22 mm in length and extensive mount of autograft mix was packed centrally as well as within each cage.  Both cages were inserted and confirmed by fluoroscopy to be in good position.  Then utilizing fluoroscopy bilateral L1 pedicle screws were placed both screws had excellent purchase.  Then the wounds copiously irrigated to Kassim states was maintained aggressively decorticated the TPs at L1 and L2 and packed an extensive mount of autograft mix posterior laterally along the TPs and lateral facet joints.  Then connected up the rods compressed L1 against L2 and tightened everything in place.  Inspected the foramina to confirm patency and no migration of graft material lay Gelfoam in the dura injected Exparel in the fascia and placed a medium Hemovac drain.  The wound was then closed with interrupted Vicryl and a running 4 subcuticular.  Dermabond benzoin Steri-Strips and a sterile  dressing was applied patient recovery in stable condition.  At the end the case all needle  counts and sponge counts were correct.
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
## 2021-01-12 NOTE — Anesthesia Postprocedure Evaluation (Signed)
Anesthesia Post Note  Patient: Denise [name]  Procedure(s) Performed: Posterior Lumbar Interbody Fusion - Lumbarone -Lumbar two with exploration fusion removal of hardware Lumbar two-three  - Posterior Lateral and Interbody fusion (Back)     Patient location during evaluation: PACU Anesthesia Type: General Level of consciousness: awake and alert Pain management: pain level controlled Vital Signs Assessment: post-procedure vital signs reviewed and stable Respiratory status: spontaneous breathing, nonlabored ventilation, respiratory function stable and patient connected to nasal cannula oxygen Cardiovascular status: blood pressure returned to baseline and stable Postop Assessment: no apparent nausea or vomiting Anesthetic complications: no   No notable events documented.  Last Vitals:  Vitals:   01/12/21 1252 01/12/21 1322  BP: 111/63 125/66  Pulse: 76 74  Resp: 16 15  Temp:    SpO2: 98% 100%    Last Pain:  Vitals:   01/12/21 1320  TempSrc:   PainSc: [place]

## 2021-01-12 NOTE — Anesthesia Procedure Notes (Signed)
Procedure Name: Intubation Date/Time: 01/12/2021 7:41 AM Performed by: Janace Litten, CRNA Pre-anesthesia Checklist: Patient identified, Emergency Drugs available, Suction available and Patient being monitored Patient Re-evaluated:Patient Re-evaluated prior to induction Oxygen Delivery Method: Circle System Utilized Preoxygenation: Pre-oxygenation with 100% oxygen Induction Type: IV induction Ventilation: Mask ventilation without difficulty Laryngoscope Size: Mac and 3 Tube type: Oral Tube size: 7.0 mm Number of attempts: 1 Airway Equipment and Method: Stylet and Oral airway Placement Confirmation: ETT inserted through vocal cords under direct vision, positive ETCO2 and breath sounds checked- equal and bilateral Secured at: 20 cm Tube secured with: Tape Dental Injury: Teeth and Oropharynx as per pre-operative assessment

## 2021-01-12 NOTE — Transfer of Care (Signed)
Immediate Anesthesia Transfer of Care Note  Patient: Denise [name]  Procedure(s) Performed: Posterior Lumbar Interbody Fusion - Lumbarone -Lumbar two with exploration fusion removal of hardware Lumbar two-three  - Posterior Lateral and Interbody fusion (Back)  Patient Location: PACU  Anesthesia Type:General  Level of Consciousness: drowsy, patient cooperative and responds to stimulation  Airway & Oxygen Therapy: Patient Spontanous Breathing and Patient connected to nasal cannula oxygen  Post-op Assessment: Report given to RN and Post -op Vital signs reviewed and stable  Post vital signs: Reviewed and stable  Last Vitals:  Vitals Value Taken Time  BP 129/61 01/12/21 1052  Temp    Pulse 87 01/12/21 1053  Resp 24 01/12/21 1053  SpO2 94 % 01/12/21 1053  Vitals shown include unvalidated device data.  Last Pain:  Vitals:   01/12/21 0607  TempSrc: Oral  PainSc: 3       Patients Stated Pain Goal: 2 ([id number])  Complications: No notable events documented.

## 2021-01-12 NOTE — TOC Progression Note (Addendum)
Transition of Care [facility]) - Progression Note    Patient Details  Name: Denise [name] MRN: [id number] Date of Birth: 07-26-1945  Transition of [facility] CM/SW [place], RN Phone [phone]  01/12/2021, 2:56 PM  Clinical Narrative:    Latricia Heft following for home health needs.        Expected Discharge Plan and Services                                                 Social Determinants of Health (SDOH) Interventions    Readmission Risk Interventions No flowsheet data found.

## 2021-01-13 LAB — GLUCOSE, CAPILLARY: Glucose-Capillary: 116 mg/dL — ABNORMAL HIGH (ref 70–99)

## 2021-01-13 MED ORDER — OXYCODONE HCL 10 MG PO TABS: 10.0000 mg | ORAL_TABLET | ORAL | 0 refills | Status: DC | PRN

## 2021-01-13 NOTE — Progress Notes (Addendum)
Patient alert and oriented, voiding adequately, MAE well with no difficulty. Incision area cdi with no s/s of infection. Patient discharged home per order. Patient and daughter stated understanding of discharge instructions given.  Extra supplies given to patient's daughter for dressing change at home. Patient has an appointment with Dr. Saintclair Halsted in 2 weeks

## 2021-01-13 NOTE — Evaluation (Signed)
Occupational Therapy Evaluation/Discharge Patient Details Name: Denise [name] MRN: [id number] DOB: Sep 12, 1945 Today's Date: 01/13/2021   History of Present Illness Pt is a 75 y/o female with progressive back, B hip and leg pain. Imaging shows segmental degeneration above previous L1-2 fusion. Pt elected to undergo decompressive laminectomy interbody fusion L1-L2 tying into her old construct at L2-3. PMH: anxiety, breast cancer, DM, knee replacement, TAVR   Clinical Impression   PTA, pt lives with family and reports Modified Independence with ADLs, IADLs and mobility with use of walking stick. Pt presents now s/p sx noted above. Educated pt on spinal precautions for ADLs/IADLs at home with pt able to return demo ADLs with Modified Independence (including brace mgmt). Pt reports family can assist with IADLs as needed at discharge. Pt verbalized understanding of all education with no further skilled OT services needed at this time. Recommend use of BSC over toilet at home to maximize ease of toilet transfers.      Recommendations for follow up therapy are one component of a multi-disciplinary discharge planning process, led by the attending physician.  Recommendations may be updated based on patient status, additional functional criteria and insurance authorization.   Follow Up Recommendations  No OT follow up    Equipment Recommendations  3 in 1 bedside commode    Recommendations for Other Services       Precautions / Restrictions Precautions Precautions: Back Precaution Booklet Issued: Yes (comment) Precaution Comments: Reviewed back precautions with pt Restrictions Weight Bearing Restrictions: No      Mobility Bed Mobility               General bed mobility comments: received up in chair    Transfers Overall transfer level: Independent Equipment used: None             General transfer comment: able to stand without AD for dressing tasks    Balance  Overall balance assessment: No apparent balance deficits (not formally assessed)                                         ADL either performed or assessed with clinical judgement   ADL Overall ADL's : Modified independent                                       General ADL Comments: Educated on spinal precautions for ADLs with pt able to return demo brace mgmt, UB and LB dressing with independent implementation of compensatory strategies (difficulty crossing LEs due to knee replacements)     Vision Baseline Vision/History: 0 No visual deficits Ability to See in Adequate Light: 0 Adequate Patient Visual Report: No change from baseline Vision Assessment?: No apparent visual deficits     Perception     Praxis      Pertinent Vitals/Pain Pain Assessment: Faces Faces Pain Scale: No hurt Pain Intervention(s): Monitored during session     Hand Dominance Right   Extremity/Trunk Assessment Upper Extremity Assessment Upper Extremity Assessment: Overall WFL for tasks assessed   Lower Extremity Assessment Lower Extremity Assessment: Defer to PT evaluation   Cervical / Trunk Assessment Cervical / Trunk Assessment: Normal   Communication Communication Communication: No difficulties   Cognition Arousal/Alertness: Awake/alert Behavior During Therapy: WFL for tasks assessed/performed Overall Cognitive Status: Within Functional Limits for tasks assessed  General Comments       Exercises     Shoulder Instructions      Home Living Family/patient expects to be discharged to:: Private residence Living Arrangements: Children Available Help at Discharge: Family;Available 24 hours/day Type of Home: House Home Access: Stairs to enter [employer] of Steps: 3 Entrance Stairs-Rails: Left Home Layout: One level     Bathroom Shower/Tub: Teacher, early years/pre: Standard      Home Equipment: Shower seat;Other (comment);Hand held shower head (walking stick)          Prior Functioning/Environment Level of Independence: Independent with assistive device(s)        Comments: Uses walking stick for mobility. Independent for ADLs        OT Problem List:        OT Treatment/Interventions:      OT Goals(Current goals can be found in the care plan section) Acute Rehab OT Goals Patient Stated Goal: go home OT Goal Formulation: All assessment and education complete, DC therapy  OT Frequency:     Barriers to D/C:            Co-evaluation              AM-PAC OT "6 Clicks" Daily Activity     Outcome Measure Help from another person eating meals?: None Help from another person taking care of personal grooming?: None Help from another person toileting, which includes using toliet, bedpan, or urinal?: None Help from another person bathing (including washing, rinsing, drying)?: None Help from another person to put on and taking off regular upper body clothing?: None Help from another person to put on and taking off regular lower body clothing?: None 6 Click Score: 24   End of Session Equipment Utilized During Treatment: Back brace Nurse Communication: Mobility status;Other (comment) (pt asking if she can have Rollator rather than RW)  Activity Tolerance: Patient tolerated treatment well Patient left: in chair;with call bell/phone within reach  OT Visit Diagnosis: Pain Pain - part of body:  (back)                Time: [id number] OT Time Calculation (min): 16 min Charges:  OT General Charges $OT Visit: 1 Visit OT Evaluation $OT Eval Low Complexity: 1 Low  Malachy Chamber, OTR/L Acute Rehab Services Office: [phone]   Layla Maw 01/13/2021, 8:44 AM

## 2021-01-13 NOTE — Progress Notes (Signed)
OT Cancellation Note  Patient Details Name: Denise [name] MRN: [id number] DOB: [birth year]   Cancelled Treatment:    Reason Eval/Treat Not Completed: Other (comment) Pt currently eating breakfast. Will follow-up again for OT eval  Layla Maw 01/13/2021, 6:57 AM

## 2021-01-13 NOTE — Discharge Instructions (Signed)

## 2021-01-13 NOTE — Discharge Summary (Signed)
Physician Discharge Summary  Patient ID: Denise [name] MRN: [id number] DOB/AGE: Sep 25, 1945 75 y.o. Estimated body mass index is 41.76 kg/m as calculated from the following:   Height as of this encounter: '5\' 1"'$  (1.549 m).   Weight as of this encounter: 100.2 kg.   Admit date: 01/12/2021 Discharge date: 01/13/2021  Admission Diagnoses: Lumbar spinal stenosis and instability L1-L2  Discharge Diagnoses: Same Active Problems:   Spinal stenosis of lumbar region   Discharged Condition: good  Hospital Course: Patient was admitted to the hospital underwent decompressive laminectomy interbody fusion L1-L2 tying into her old construct at L2-3.  Postoperative patient did very well with went to covering the floor on the floor was ambulating and voiding spontaneously tolerating regular diet stable for discharge home with scheduled follow-up in 1 to 2 weeks.  Consults: Significant Diagnostic Studies: Treatments: PLIF L1-L2 Discharge Exam: Blood pressure (!) 143/71, pulse 86, temperature 98.6 F (37 C), temperature source Oral, resp. rate 18, height '5\' 1"'$  (1.549 m), weight 100.2 kg, SpO2 95 %. Strength 5-5 wound clean dry and intact  Disposition: Home   Allergies as of 01/13/2021       Reactions   Doxazosin Nausea And Vomiting   Dizziness    Clonidine Hydrochloride Other (See Comments)   Bradycardia   Erythromycin Palpitations   Hydrocodone-acetaminophen Nausea Only   Pork-derived Products Diarrhea, Nausea Only   Rosiglitazone Maleate Swelling   SWELLING REACTION UNSPECIFIED         Medication List     TAKE these medications    acetaminophen 500 MG tablet Commonly known as: TYLENOL Take 1,000 mg by mouth every 6 (six) hours as needed for moderate pain.   albuterol 108 (90 Base) MCG/ACT inhaler Commonly known as: VENTOLIN HFA Inhale 2 puffs into the lungs every 6 (six) hours as needed for wheezing or shortness of breath.   Alive Womens 50+ Tabs Take 1 tablet by  mouth daily.   amLODipine 10 MG tablet Commonly known as: NORVASC Take 1 tablet (10 mg total) by mouth daily.   anastrozole 1 MG tablet Commonly known as: ARIMIDEX Take 1 tablet (1 mg total) by mouth daily.   aspirin 81 MG EC tablet Take 81 mg by mouth daily.   atorvastatin 40 MG tablet Commonly known as: LIPITOR Take 1 tablet (40 mg total) by mouth daily.   budesonide-formoterol 160-4.5 MCG/ACT inhaler Commonly known as: SYMBICORT Inhale 2 puffs into the lungs 2 (two) times daily.   CALCIUM 600 + D PO Take 1 tablet by mouth daily.   cetirizine 10 MG tablet Commonly known as: EQ Allergy Relief (Cetirizine) TAKE 1 TABLET BY MOUTH ONCE DAILY AS NEEDED FOR  ALLERGIES What changed:  how much to take how to take this when to take this additional instructions   cyclobenzaprine 10 MG tablet Commonly known as: FLEXERIL Take 10 mg by mouth 3 (three) times daily as needed for muscle spasms.   diclofenac Sodium 1 % Gel Commonly known as: VOLTAREN Apply 4 g topically 4 (four) times daily. What changed:  when to take this reasons to take this   Fish Oil 1000 MG Caps Take 1,000 mg by mouth daily.   fluticasone 50 MCG/ACT nasal spray Commonly known as: FLONASE Place 2 sprays into both nostrils daily.   furosemide 40 MG tablet Commonly known as: LASIX Take 1 tablet (40 mg total) by mouth 2 (two) times daily.   metFORMIN 500 MG tablet Commonly known as: GLUCOPHAGE TAKE 2 TABLETS BY MOUTH TWICE DAILY  WITH A MEAL   Oxycodone HCl 10 MG Tabs Take 1 tablet (10 mg total) by mouth every 3 (three) hours as needed for severe pain ((score 7 to 10)).   potassium chloride 10 MEQ CR capsule Commonly known as: MICRO-K TAKE 4 CAPSULES BY MOUTH ONCE DAILY   REFRESH OP Place 1 drop into both eyes daily as needed (dry eyes).   valsartan 40 MG tablet Commonly known as: DIOVAN Take 0.5 tablets (20 mg total) by mouth as directed. Take 1/2 tablet daily         Signed: Elaina Hoops 01/13/2021, 7:34 AM

## 2021-01-13 NOTE — Progress Notes (Signed)
PT Evaluation   01/13/21 0748  PT Visit Information  Last PT Received On 01/13/21  Assistance Needed +1  History of Present Illness Pt is a 74 y/o female s/p L1-2 PLIF on 9/16. PMH includes DM, TAVR, breast cancer, and HTN.  Precautions  Precautions Back  Precaution Booklet Issued Yes (comment)  Precaution Comments Reviewed back precautions with pt  Required Braces or Orthoses Spinal Brace  Spinal Brace Lumbar corset;Applied in sitting position  Restrictions  Weight Bearing Restrictions No  Home Living  Family/patient expects to be discharged to: Private residence  Living Arrangements Children  Available Help at Discharge Family;Available 24 hours/day  Type of Home House  Home Access Stairs to enter  Entrance Stairs-Number of Steps 3  Entrance Stairs-Rails Left  Home Layout One level  Bathroom Shower/Tub Tub/shower unit  English as a second language teacher;Other (comment) (walking stick)  Prior Function  Level of Independence Independent with assistive device(s)  Comments Uses walking stick for mobility.  Communication  Communication No difficulties  Pain Assessment  Pain Assessment Faces  Faces Pain Scale 2  Pain Location back  Pain Descriptors / Indicators Aching;Operative site guarding  Pain Intervention(s) Limited activity within patient's tolerance;Monitored during session;Repositioned  Cognition  Arousal/Alertness Awake/alert  Behavior During Therapy WFL for tasks assessed/performed  Overall Cognitive Status Within Functional Limits for tasks assessed  Upper Extremity Assessment  Upper Extremity Assessment Defer to OT evaluation  Lower Extremity Assessment  Lower Extremity Assessment Generalized weakness  Cervical / Trunk Assessment  Cervical / Trunk Assessment Other exceptions  Cervical / Trunk Exceptions s/p lumbar surgery  Bed Mobility  Overal bed mobility Needs Assistance  Bed Mobility Rolling;Sidelying to Sit  Rolling Supervision   Sidelying to sit Supervision  General bed mobility comments Supervision for safety. Cues for log roll techniuqe.  Transfers  Overall transfer level Needs assistance  Equipment used Rolling walker (2 wheeled)  Transfers Sit to/from Stand  Sit to Stand Min guard  General transfer comment Min guard for safety. Cues for hand placement.  Ambulation/Gait  Ambulation/Gait assistance Min guard;Supervision  Gait Distance (Feet) 120 Feet  Assistive device Rolling walker (2 wheeled)  Gait Pattern/deviations Step-through pattern;Decreased stride length  General Gait Details Slow, cautious gait. Min guard to supervision for safety. Reporting mild dizziness during gait following stair training.  Gait velocity Decreased  Stairs Yes  Stairs assistance Min guard  Stair Management One rail Left;Forwards;Step to pattern  Number of Stairs 3  General stair comments Overall steady stair navigation. Min guard for safety. Mild dizziness reported after stair navigation.  Balance  Overall balance assessment Mild deficits observed, not formally tested  PT - End of Session  Equipment Utilized During Treatment Back brace;Gait belt  Activity Tolerance Patient tolerated treatment well  Patient left in bed;with call bell/phone within reach  Nurse Communication Mobility status  PT Assessment  PT Recommendation/Assessment Patient needs continued PT services  PT Visit Diagnosis Other abnormalities of gait and mobility (R26.89);Pain  Pain - part of body  (back)  PT Problem List Decreased strength;Decreased activity tolerance;Decreased balance;Decreased mobility;Decreased knowledge of use of DME;Decreased knowledge of precautions;Pain  PT Plan  PT Frequency (ACUTE ONLY) Min 5X/week  PT Treatment/Interventions (ACUTE ONLY) DME instruction;Gait training;Stair training;Functional mobility training;Therapeutic activities;Therapeutic exercise;Balance training;Patient/family education  AM-PAC PT "6 Clicks" Mobility  Outcome Measure (Version 2)  Help needed turning from your back to your side while in a flat bed without using bedrails? 4  Help needed moving from lying on your back  to sitting on the side of a flat bed without using bedrails? 3  Help needed moving to and from a bed to a chair (including a wheelchair)? 3  Help needed standing up from a chair using your arms (e.g., wheelchair or bedside chair)? 3  Help needed to walk in hospital room? 3  Help needed climbing 3-5 steps with a railing?  2  6 Click Score 18  Consider Recommendation of Discharge To: Home with [facility]  Progressive Mobility  What is the highest level of mobility based on the progressive mobility assessment? Level 5 (Walks with assist in room/hall) - Balance while stepping forward/back and can walk in room with assist - Complete  Mobility Ambulated with assistance in hallway  PT Recommendation  Follow Up Recommendations No PT follow up  PT equipment Rolling walker with 5" wheels;3in1 (PT)  Individuals Consulted  Consulted and Agree with Results and Recommendations Patient  Acute Rehab PT Goals  Patient Stated Goal to go home  PT Goal Formulation With patient  Time For Goal Achievement 01/27/21  Potential to Achieve Goals Good  PT Time Calculation  PT Start Time (ACUTE ONLY) 0727  PT Stop Time (ACUTE ONLY) 0744  PT Time Calculation (min) (ACUTE ONLY) 17 min  PT General Charges  $$ ACUTE PT VISIT 1 Visit  PT Evaluation  $PT Eval Low Complexity 1 Low  Written Expression  Dominant Hand Right    Pt admitted secondary to problem above with deficits above. Pt requiring supervision for bed mobility and min guard to supervision for transfers and gait using RW. Educated about generalized walking program and back precautions. Pt reports daughter and grandson can assist if needed at d/c. Will continue to follow acutely.   Reuel Derby, PT, DPT  Acute Rehabilitation Services  Pager: [phone] Office: [phone]

## 2021-01-14 DIAGNOSIS — I10 Essential (primary) hypertension: Secondary | ICD-10-CM | POA: Diagnosis not present

## 2021-01-14 DIAGNOSIS — E669 Obesity, unspecified: Secondary | ICD-10-CM | POA: Diagnosis not present

## 2021-01-14 DIAGNOSIS — Z48811 Encounter for surgical aftercare following surgery on the nervous system: Secondary | ICD-10-CM | POA: Diagnosis not present

## 2021-01-14 DIAGNOSIS — Z853 Personal history of malignant neoplasm of breast: Secondary | ICD-10-CM | POA: Diagnosis not present

## 2021-01-14 DIAGNOSIS — J45909 Unspecified asthma, uncomplicated: Secondary | ICD-10-CM | POA: Diagnosis not present

## 2021-01-14 DIAGNOSIS — E785 Hyperlipidemia, unspecified: Secondary | ICD-10-CM | POA: Diagnosis not present

## 2021-01-14 DIAGNOSIS — E119 Type 2 diabetes mellitus without complications: Secondary | ICD-10-CM | POA: Diagnosis not present

## 2021-01-14 DIAGNOSIS — Z7984 Long term (current) use of oral hypoglycemic drugs: Secondary | ICD-10-CM | POA: Diagnosis not present

## 2021-01-14 DIAGNOSIS — Z7982 Long term (current) use of aspirin: Secondary | ICD-10-CM | POA: Diagnosis not present

## 2021-01-14 DIAGNOSIS — M48062 Spinal stenosis, lumbar region with neurogenic claudication: Secondary | ICD-10-CM | POA: Diagnosis not present

## 2021-01-17 NOTE — Telephone Encounter (Signed)
Patient has been scheduled in December

## 2021-01-19 DIAGNOSIS — Z7982 Long term (current) use of aspirin: Secondary | ICD-10-CM | POA: Diagnosis not present

## 2021-01-19 DIAGNOSIS — Z48811 Encounter for surgical aftercare following surgery on the nervous system: Secondary | ICD-10-CM | POA: Diagnosis not present

## 2021-01-19 DIAGNOSIS — M48062 Spinal stenosis, lumbar region with neurogenic claudication: Secondary | ICD-10-CM | POA: Diagnosis not present

## 2021-01-19 DIAGNOSIS — J45909 Unspecified asthma, uncomplicated: Secondary | ICD-10-CM | POA: Diagnosis not present

## 2021-01-19 DIAGNOSIS — E119 Type 2 diabetes mellitus without complications: Secondary | ICD-10-CM | POA: Diagnosis not present

## 2021-01-19 DIAGNOSIS — I10 Essential (primary) hypertension: Secondary | ICD-10-CM | POA: Diagnosis not present

## 2021-01-22 DIAGNOSIS — Z7982 Long term (current) use of aspirin: Secondary | ICD-10-CM | POA: Diagnosis not present

## 2021-01-22 DIAGNOSIS — J45909 Unspecified asthma, uncomplicated: Secondary | ICD-10-CM | POA: Diagnosis not present

## 2021-01-22 DIAGNOSIS — E119 Type 2 diabetes mellitus without complications: Secondary | ICD-10-CM | POA: Diagnosis not present

## 2021-01-22 DIAGNOSIS — Z48811 Encounter for surgical aftercare following surgery on the nervous system: Secondary | ICD-10-CM | POA: Diagnosis not present

## 2021-01-22 DIAGNOSIS — I10 Essential (primary) hypertension: Secondary | ICD-10-CM | POA: Diagnosis not present

## 2021-01-22 DIAGNOSIS — M48062 Spinal stenosis, lumbar region with neurogenic claudication: Secondary | ICD-10-CM | POA: Diagnosis not present

## 2021-01-24 DIAGNOSIS — Z7982 Long term (current) use of aspirin: Secondary | ICD-10-CM | POA: Diagnosis not present

## 2021-01-24 DIAGNOSIS — M48062 Spinal stenosis, lumbar region with neurogenic claudication: Secondary | ICD-10-CM | POA: Diagnosis not present

## 2021-01-24 DIAGNOSIS — J45909 Unspecified asthma, uncomplicated: Secondary | ICD-10-CM | POA: Diagnosis not present

## 2021-01-24 DIAGNOSIS — E119 Type 2 diabetes mellitus without complications: Secondary | ICD-10-CM | POA: Diagnosis not present

## 2021-01-24 DIAGNOSIS — Z48811 Encounter for surgical aftercare following surgery on the nervous system: Secondary | ICD-10-CM | POA: Diagnosis not present

## 2021-01-24 DIAGNOSIS — I10 Essential (primary) hypertension: Secondary | ICD-10-CM | POA: Diagnosis not present

## 2021-01-26 DIAGNOSIS — Z48811 Encounter for surgical aftercare following surgery on the nervous system: Secondary | ICD-10-CM | POA: Diagnosis not present

## 2021-01-26 DIAGNOSIS — M48062 Spinal stenosis, lumbar region with neurogenic claudication: Secondary | ICD-10-CM | POA: Diagnosis not present

## 2021-01-26 DIAGNOSIS — J45909 Unspecified asthma, uncomplicated: Secondary | ICD-10-CM | POA: Diagnosis not present

## 2021-01-26 DIAGNOSIS — Z7982 Long term (current) use of aspirin: Secondary | ICD-10-CM | POA: Diagnosis not present

## 2021-01-26 DIAGNOSIS — E119 Type 2 diabetes mellitus without complications: Secondary | ICD-10-CM | POA: Diagnosis not present

## 2021-01-26 DIAGNOSIS — I10 Essential (primary) hypertension: Secondary | ICD-10-CM | POA: Diagnosis not present

## 2021-02-02 DIAGNOSIS — R262 Difficulty in walking, not elsewhere classified: Secondary | ICD-10-CM | POA: Diagnosis not present

## 2021-02-02 DIAGNOSIS — M6281 Muscle weakness (generalized): Secondary | ICD-10-CM | POA: Diagnosis not present

## 2021-02-02 DIAGNOSIS — M5137 Other intervertebral disc degeneration, lumbosacral region: Secondary | ICD-10-CM | POA: Diagnosis not present

## 2021-02-02 DIAGNOSIS — M5440 Lumbago with sciatica, unspecified side: Secondary | ICD-10-CM | POA: Diagnosis not present

## 2021-02-13 DIAGNOSIS — M5440 Lumbago with sciatica, unspecified side: Secondary | ICD-10-CM | POA: Diagnosis not present

## 2021-02-13 DIAGNOSIS — R262 Difficulty in walking, not elsewhere classified: Secondary | ICD-10-CM | POA: Diagnosis not present

## 2021-02-13 DIAGNOSIS — M6281 Muscle weakness (generalized): Secondary | ICD-10-CM | POA: Diagnosis not present

## 2021-02-13 DIAGNOSIS — M5137 Other intervertebral disc degeneration, lumbosacral region: Secondary | ICD-10-CM | POA: Diagnosis not present

## 2021-02-15 DIAGNOSIS — M6281 Muscle weakness (generalized): Secondary | ICD-10-CM | POA: Diagnosis not present

## 2021-02-15 DIAGNOSIS — M5137 Other intervertebral disc degeneration, lumbosacral region: Secondary | ICD-10-CM | POA: Diagnosis not present

## 2021-02-15 DIAGNOSIS — R262 Difficulty in walking, not elsewhere classified: Secondary | ICD-10-CM | POA: Diagnosis not present

## 2021-02-15 DIAGNOSIS — M5440 Lumbago with sciatica, unspecified side: Secondary | ICD-10-CM | POA: Diagnosis not present

## 2021-02-20 DIAGNOSIS — M5440 Lumbago with sciatica, unspecified side: Secondary | ICD-10-CM | POA: Diagnosis not present

## 2021-02-20 DIAGNOSIS — M6281 Muscle weakness (generalized): Secondary | ICD-10-CM | POA: Diagnosis not present

## 2021-02-20 DIAGNOSIS — M5137 Other intervertebral disc degeneration, lumbosacral region: Secondary | ICD-10-CM | POA: Diagnosis not present

## 2021-02-20 DIAGNOSIS — R262 Difficulty in walking, not elsewhere classified: Secondary | ICD-10-CM | POA: Diagnosis not present

## 2021-02-22 DIAGNOSIS — M5137 Other intervertebral disc degeneration, lumbosacral region: Secondary | ICD-10-CM | POA: Diagnosis not present

## 2021-02-22 DIAGNOSIS — M6281 Muscle weakness (generalized): Secondary | ICD-10-CM | POA: Diagnosis not present

## 2021-02-22 DIAGNOSIS — M5440 Lumbago with sciatica, unspecified side: Secondary | ICD-10-CM | POA: Diagnosis not present

## 2021-02-22 DIAGNOSIS — R262 Difficulty in walking, not elsewhere classified: Secondary | ICD-10-CM | POA: Diagnosis not present

## 2021-02-26 DIAGNOSIS — M5137 Other intervertebral disc degeneration, lumbosacral region: Secondary | ICD-10-CM | POA: Diagnosis not present

## 2021-02-26 DIAGNOSIS — M6281 Muscle weakness (generalized): Secondary | ICD-10-CM | POA: Diagnosis not present

## 2021-02-26 DIAGNOSIS — R262 Difficulty in walking, not elsewhere classified: Secondary | ICD-10-CM | POA: Diagnosis not present

## 2021-02-26 DIAGNOSIS — M5440 Lumbago with sciatica, unspecified side: Secondary | ICD-10-CM | POA: Diagnosis not present

## 2021-02-27 ENCOUNTER — Telehealth: Payer: Self-pay | Admitting: Internal Medicine

## 2021-02-27 NOTE — Telephone Encounter (Signed)
LVM for pt to rtn my call to schedule AWV with [facility].  

## 2021-02-28 DIAGNOSIS — M5137 Other intervertebral disc degeneration, lumbosacral region: Secondary | ICD-10-CM | POA: Diagnosis not present

## 2021-02-28 DIAGNOSIS — M6281 Muscle weakness (generalized): Secondary | ICD-10-CM | POA: Diagnosis not present

## 2021-02-28 DIAGNOSIS — R262 Difficulty in walking, not elsewhere classified: Secondary | ICD-10-CM | POA: Diagnosis not present

## 2021-02-28 DIAGNOSIS — M5440 Lumbago with sciatica, unspecified side: Secondary | ICD-10-CM | POA: Diagnosis not present

## 2021-03-06 DIAGNOSIS — M5137 Other intervertebral disc degeneration, lumbosacral region: Secondary | ICD-10-CM | POA: Diagnosis not present

## 2021-03-06 DIAGNOSIS — R262 Difficulty in walking, not elsewhere classified: Secondary | ICD-10-CM | POA: Diagnosis not present

## 2021-03-06 DIAGNOSIS — M5440 Lumbago with sciatica, unspecified side: Secondary | ICD-10-CM | POA: Diagnosis not present

## 2021-03-06 DIAGNOSIS — M6281 Muscle weakness (generalized): Secondary | ICD-10-CM | POA: Diagnosis not present

## 2021-03-08 DIAGNOSIS — R262 Difficulty in walking, not elsewhere classified: Secondary | ICD-10-CM | POA: Diagnosis not present

## 2021-03-08 DIAGNOSIS — M5440 Lumbago with sciatica, unspecified side: Secondary | ICD-10-CM | POA: Diagnosis not present

## 2021-03-08 DIAGNOSIS — M5137 Other intervertebral disc degeneration, lumbosacral region: Secondary | ICD-10-CM | POA: Diagnosis not present

## 2021-03-08 DIAGNOSIS — M6281 Muscle weakness (generalized): Secondary | ICD-10-CM | POA: Diagnosis not present

## 2021-03-13 DIAGNOSIS — M5440 Lumbago with sciatica, unspecified side: Secondary | ICD-10-CM | POA: Diagnosis not present

## 2021-03-13 DIAGNOSIS — M5137 Other intervertebral disc degeneration, lumbosacral region: Secondary | ICD-10-CM | POA: Diagnosis not present

## 2021-03-13 DIAGNOSIS — R262 Difficulty in walking, not elsewhere classified: Secondary | ICD-10-CM | POA: Diagnosis not present

## 2021-03-13 DIAGNOSIS — M6281 Muscle weakness (generalized): Secondary | ICD-10-CM | POA: Diagnosis not present

## 2021-03-15 DIAGNOSIS — M5137 Other intervertebral disc degeneration, lumbosacral region: Secondary | ICD-10-CM | POA: Diagnosis not present

## 2021-03-15 DIAGNOSIS — M6281 Muscle weakness (generalized): Secondary | ICD-10-CM | POA: Diagnosis not present

## 2021-03-15 DIAGNOSIS — M5440 Lumbago with sciatica, unspecified side: Secondary | ICD-10-CM | POA: Diagnosis not present

## 2021-03-15 DIAGNOSIS — R262 Difficulty in walking, not elsewhere classified: Secondary | ICD-10-CM | POA: Diagnosis not present

## 2021-03-16 ENCOUNTER — Telehealth: Payer: Self-pay | Admitting: Internal Medicine

## 2021-03-16 NOTE — Telephone Encounter (Signed)
Pt has lab appt 12/16, appt with PCP 12/20. Please enter lab orders.

## 2021-03-16 NOTE — Telephone Encounter (Signed)
Ok to cancel lab appt as pt has traditional medicare as primary insurance, and it will not pay for lab done prior to visit

## 2021-03-19 DIAGNOSIS — M65341 Trigger finger, right ring finger: Secondary | ICD-10-CM | POA: Diagnosis not present

## 2021-03-19 DIAGNOSIS — S63659A Sprain of metacarpophalangeal joint of unspecified finger, initial encounter: Secondary | ICD-10-CM | POA: Insufficient documentation

## 2021-03-19 DIAGNOSIS — M79645 Pain in left finger(s): Secondary | ICD-10-CM | POA: Diagnosis not present

## 2021-03-19 DIAGNOSIS — S63653A Sprain of metacarpophalangeal joint of left middle finger, initial encounter: Secondary | ICD-10-CM | POA: Diagnosis not present

## 2021-03-19 DIAGNOSIS — M65351 Trigger finger, right little finger: Secondary | ICD-10-CM | POA: Diagnosis not present

## 2021-03-19 DIAGNOSIS — M79644 Pain in right finger(s): Secondary | ICD-10-CM | POA: Diagnosis not present

## 2021-03-19 NOTE — Telephone Encounter (Signed)
Can someone from Primary Care reach out to this patient? A lab appt was scheduled, there are no orders and PCP will not pre order due to her primary insurance. Lab appt needs to be cancelled.

## 2021-03-21 DIAGNOSIS — M6281 Muscle weakness (generalized): Secondary | ICD-10-CM | POA: Diagnosis not present

## 2021-03-21 DIAGNOSIS — M5440 Lumbago with sciatica, unspecified side: Secondary | ICD-10-CM | POA: Diagnosis not present

## 2021-03-21 DIAGNOSIS — M5137 Other intervertebral disc degeneration, lumbosacral region: Secondary | ICD-10-CM | POA: Diagnosis not present

## 2021-03-21 DIAGNOSIS — R262 Difficulty in walking, not elsewhere classified: Secondary | ICD-10-CM | POA: Diagnosis not present

## 2021-03-26 DIAGNOSIS — M5137 Other intervertebral disc degeneration, lumbosacral region: Secondary | ICD-10-CM | POA: Diagnosis not present

## 2021-03-26 DIAGNOSIS — R262 Difficulty in walking, not elsewhere classified: Secondary | ICD-10-CM | POA: Diagnosis not present

## 2021-03-26 DIAGNOSIS — M6281 Muscle weakness (generalized): Secondary | ICD-10-CM | POA: Diagnosis not present

## 2021-03-26 DIAGNOSIS — M5440 Lumbago with sciatica, unspecified side: Secondary | ICD-10-CM | POA: Diagnosis not present

## 2021-03-29 NOTE — Telephone Encounter (Signed)
Patient contacted today to cancel lab visit on 12.16.22. Patient will have labs drawn when she comes to see Dr. Jenny Reichmann on 12.20.22

## 2021-03-30 DIAGNOSIS — M6281 Muscle weakness (generalized): Secondary | ICD-10-CM | POA: Diagnosis not present

## 2021-03-30 DIAGNOSIS — M5137 Other intervertebral disc degeneration, lumbosacral region: Secondary | ICD-10-CM | POA: Diagnosis not present

## 2021-03-30 DIAGNOSIS — M5440 Lumbago with sciatica, unspecified side: Secondary | ICD-10-CM | POA: Diagnosis not present

## 2021-03-30 DIAGNOSIS — R262 Difficulty in walking, not elsewhere classified: Secondary | ICD-10-CM | POA: Diagnosis not present

## 2021-04-02 DIAGNOSIS — R262 Difficulty in walking, not elsewhere classified: Secondary | ICD-10-CM | POA: Diagnosis not present

## 2021-04-02 DIAGNOSIS — M5137 Other intervertebral disc degeneration, lumbosacral region: Secondary | ICD-10-CM | POA: Diagnosis not present

## 2021-04-02 DIAGNOSIS — M5440 Lumbago with sciatica, unspecified side: Secondary | ICD-10-CM | POA: Diagnosis not present

## 2021-04-02 DIAGNOSIS — M6281 Muscle weakness (generalized): Secondary | ICD-10-CM | POA: Diagnosis not present

## 2021-04-05 DIAGNOSIS — Z6841 Body Mass Index (BMI) 40.0 and over, adult: Secondary | ICD-10-CM | POA: Diagnosis not present

## 2021-04-05 DIAGNOSIS — M5440 Lumbago with sciatica, unspecified side: Secondary | ICD-10-CM | POA: Diagnosis not present

## 2021-04-05 DIAGNOSIS — I10 Essential (primary) hypertension: Secondary | ICD-10-CM | POA: Diagnosis not present

## 2021-04-06 DIAGNOSIS — R262 Difficulty in walking, not elsewhere classified: Secondary | ICD-10-CM | POA: Diagnosis not present

## 2021-04-06 DIAGNOSIS — M6281 Muscle weakness (generalized): Secondary | ICD-10-CM | POA: Diagnosis not present

## 2021-04-06 DIAGNOSIS — M5137 Other intervertebral disc degeneration, lumbosacral region: Secondary | ICD-10-CM | POA: Diagnosis not present

## 2021-04-06 DIAGNOSIS — M5440 Lumbago with sciatica, unspecified side: Secondary | ICD-10-CM | POA: Diagnosis not present

## 2021-04-09 DIAGNOSIS — M5137 Other intervertebral disc degeneration, lumbosacral region: Secondary | ICD-10-CM | POA: Diagnosis not present

## 2021-04-09 DIAGNOSIS — R262 Difficulty in walking, not elsewhere classified: Secondary | ICD-10-CM | POA: Diagnosis not present

## 2021-04-09 DIAGNOSIS — M6281 Muscle weakness (generalized): Secondary | ICD-10-CM | POA: Diagnosis not present

## 2021-04-09 DIAGNOSIS — M5440 Lumbago with sciatica, unspecified side: Secondary | ICD-10-CM | POA: Diagnosis not present

## 2021-04-11 DIAGNOSIS — M6281 Muscle weakness (generalized): Secondary | ICD-10-CM | POA: Diagnosis not present

## 2021-04-11 DIAGNOSIS — R262 Difficulty in walking, not elsewhere classified: Secondary | ICD-10-CM | POA: Diagnosis not present

## 2021-04-11 DIAGNOSIS — M5440 Lumbago with sciatica, unspecified side: Secondary | ICD-10-CM | POA: Diagnosis not present

## 2021-04-11 DIAGNOSIS — M5137 Other intervertebral disc degeneration, lumbosacral region: Secondary | ICD-10-CM | POA: Diagnosis not present

## 2021-04-13 ENCOUNTER — Other Ambulatory Visit: Payer: Medicare Other

## 2021-04-16 ENCOUNTER — Encounter (HOSPITAL_BASED_OUTPATIENT_CLINIC_OR_DEPARTMENT_OTHER): Payer: Self-pay | Admitting: Cardiovascular Disease

## 2021-04-16 DIAGNOSIS — M5440 Lumbago with sciatica, unspecified side: Secondary | ICD-10-CM | POA: Diagnosis not present

## 2021-04-16 DIAGNOSIS — R262 Difficulty in walking, not elsewhere classified: Secondary | ICD-10-CM | POA: Diagnosis not present

## 2021-04-16 DIAGNOSIS — M6281 Muscle weakness (generalized): Secondary | ICD-10-CM | POA: Diagnosis not present

## 2021-04-16 DIAGNOSIS — M5137 Other intervertebral disc degeneration, lumbosacral region: Secondary | ICD-10-CM | POA: Diagnosis not present

## 2021-04-17 ENCOUNTER — Encounter: Payer: Self-pay | Admitting: Internal Medicine

## 2021-04-17 ENCOUNTER — Other Ambulatory Visit: Payer: Self-pay

## 2021-04-17 ENCOUNTER — Ambulatory Visit (INDEPENDENT_AMBULATORY_CARE_PROVIDER_SITE_OTHER): Payer: Medicare Other | Admitting: Internal Medicine

## 2021-04-17 VITALS — BP 158/80 | HR 76 | Temp 98.6°F | Ht 61.0 in | Wt 220.0 lb

## 2021-04-17 DIAGNOSIS — I6523 Occlusion and stenosis of bilateral carotid arteries: Secondary | ICD-10-CM

## 2021-04-17 DIAGNOSIS — E119 Type 2 diabetes mellitus without complications: Secondary | ICD-10-CM | POA: Diagnosis not present

## 2021-04-17 DIAGNOSIS — E7800 Pure hypercholesterolemia, unspecified: Secondary | ICD-10-CM

## 2021-04-17 DIAGNOSIS — Z23 Encounter for immunization: Secondary | ICD-10-CM

## 2021-04-17 DIAGNOSIS — F32A Depression, unspecified: Secondary | ICD-10-CM

## 2021-04-17 DIAGNOSIS — E538 Deficiency of other specified B group vitamins: Secondary | ICD-10-CM

## 2021-04-17 DIAGNOSIS — E559 Vitamin D deficiency, unspecified: Secondary | ICD-10-CM

## 2021-04-17 DIAGNOSIS — I10 Essential (primary) hypertension: Secondary | ICD-10-CM | POA: Diagnosis not present

## 2021-04-17 LAB — BASIC METABOLIC PANEL
BUN: 16 mg/dL (ref 6–23)
CO2: 30 mEq/L (ref 19–32)
Calcium: 10.2 mg/dL (ref 8.4–10.5)
Chloride: 103 mEq/L (ref 96–112)
Creatinine, Ser: 0.81 mg/dL (ref 0.40–1.20)
GFR: 70.84 mL/min (ref >60.00)
Glucose, Bld: 105 mg/dL — ABNORMAL HIGH (ref 70–99)
Potassium: 3.9 mEq/L (ref 3.5–5.1)
Sodium: 141 mEq/L (ref 135–145)

## 2021-04-17 LAB — LIPID PANEL
Cholesterol: 179 mg/dL (ref 0–200)
HDL: 87.6 mg/dL (ref >39.00)
LDL Cholesterol: 79 mg/dL (ref 0–99)
NonHDL: 91.33
Total CHOL/HDL Ratio: 2
Triglycerides: 63 mg/dL (ref 0.0–149.0)
VLDL: 12.6 mg/dL (ref 0.0–40.0)

## 2021-04-17 LAB — HEMOGLOBIN A1C: Hgb A1c MFr Bld: 6.2 % (ref 4.6–6.5)

## 2021-04-17 LAB — HEPATIC FUNCTION PANEL
ALT: 22 U/L (ref 0–35)
AST: 27 U/L (ref 0–37)
Albumin: 4.3 g/dL (ref 3.5–5.2)
Alkaline Phosphatase: 56 U/L (ref 39–117)
Bilirubin, Direct: 0.2 mg/dL (ref 0.0–0.3)
Total Bilirubin: 0.7 mg/dL (ref 0.2–1.2)
Total Protein: 7.7 g/dL (ref 6.0–8.3)

## 2021-04-17 LAB — VITAMIN D 25 HYDROXY (VIT D DEFICIENCY, FRACTURES): VITD: 51.25 ng/mL (ref 30.00–100.00)

## 2021-04-17 NOTE — Patient Instructions (Signed)
Please continue all other medications as before, and refills have been done if requested.  Please have the pharmacy call with any other refills you may need.  Please continue your efforts at being more active, low cholesterol diet, and weight control.  You are otherwise up to date with prevention measures today.  Please keep your appointments with your specialists as you may have planned  Please go to the LAB at the blood drawing area for the tests to be done  You will be contacted by phone if any changes need to be made immediately.  Otherwise, you will receive a letter about your results with an explanation, but please check with MyChart first.  Please remember to sign up for MyChart if you have not done so, as this will be important to you in the future with finding out test results, communicating by private email, and scheduling acute appointments online when needed.  Please make an Appointment to return in 6 months, or sooner if needed,

## 2021-04-17 NOTE — Assessment & Plan Note (Signed)
Lab Results  Component Value Date   HGBA1C 6.1 (H) 01/03/2021   Stable, pt to continue current medical treatment metformin, for f/u a1c today

## 2021-04-17 NOTE — Assessment & Plan Note (Signed)
BP Readings from Last 3 Encounters:  04/17/21 (!) 158/80  01/13/21 (!) 143/71  01/08/21 122/82   Uncontrolled, pt to continue medical treatment norvasc, diovan as declines change, to cont f/u BP at home and next visit

## 2021-04-17 NOTE — Assessment & Plan Note (Signed)
Stable overall , declines need for ay change in tx or counseling at this time
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
## 2021-04-17 NOTE — Assessment & Plan Note (Signed)
Lab Results  Component Value Date   LDLCALC 77 10/12/2020   Mild uncontrolled, goal ldl < 70 pt to continue current statin lipitor 40 as declines change and lower chol DM diet

## 2021-04-18 DIAGNOSIS — M6281 Muscle weakness (generalized): Secondary | ICD-10-CM | POA: Diagnosis not present

## 2021-04-18 DIAGNOSIS — M5137 Other intervertebral disc degeneration, lumbosacral region: Secondary | ICD-10-CM | POA: Diagnosis not present

## 2021-04-18 DIAGNOSIS — R262 Difficulty in walking, not elsewhere classified: Secondary | ICD-10-CM | POA: Diagnosis not present

## 2021-04-18 DIAGNOSIS — M5440 Lumbago with sciatica, unspecified side: Secondary | ICD-10-CM | POA: Diagnosis not present

## 2021-04-24 DIAGNOSIS — M5440 Lumbago with sciatica, unspecified side: Secondary | ICD-10-CM | POA: Diagnosis not present

## 2021-04-24 DIAGNOSIS — R262 Difficulty in walking, not elsewhere classified: Secondary | ICD-10-CM | POA: Diagnosis not present

## 2021-04-24 DIAGNOSIS — M5137 Other intervertebral disc degeneration, lumbosacral region: Secondary | ICD-10-CM | POA: Diagnosis not present

## 2021-04-24 DIAGNOSIS — M6281 Muscle weakness (generalized): Secondary | ICD-10-CM | POA: Diagnosis not present
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
## 2021-04-25 ENCOUNTER — Other Ambulatory Visit: Payer: Self-pay

## 2021-04-25 ENCOUNTER — Encounter (HOSPITAL_BASED_OUTPATIENT_CLINIC_OR_DEPARTMENT_OTHER): Payer: Self-pay | Admitting: Cardiovascular Disease

## 2021-04-25 ENCOUNTER — Ambulatory Visit (INDEPENDENT_AMBULATORY_CARE_PROVIDER_SITE_OTHER): Payer: Medicare Other | Admitting: Cardiovascular Disease

## 2021-04-25 VITALS — BP 156/86 | HR 72 | Ht 61.0 in | Wt 220.0 lb

## 2021-04-25 DIAGNOSIS — I10 Essential (primary) hypertension: Secondary | ICD-10-CM

## 2021-04-25 DIAGNOSIS — Z952 Presence of prosthetic heart valve: Secondary | ICD-10-CM | POA: Diagnosis not present

## 2021-04-25 DIAGNOSIS — I2584 Coronary atherosclerosis due to calcified coronary lesion: Secondary | ICD-10-CM | POA: Diagnosis not present

## 2021-04-25 DIAGNOSIS — Z5181 Encounter for therapeutic drug level monitoring: Secondary | ICD-10-CM

## 2021-04-25 DIAGNOSIS — I6523 Occlusion and stenosis of bilateral carotid arteries: Secondary | ICD-10-CM | POA: Diagnosis not present

## 2021-04-25 DIAGNOSIS — I2510 Atherosclerotic heart disease of native coronary artery without angina pectoris: Secondary | ICD-10-CM

## 2021-04-25 MED ORDER — VALSARTAN 40 MG PO TABS: ORAL_TABLET | ORAL | 3 refills | Status: DC

## 2021-04-25 NOTE — Patient Instructions (Signed)
Medication Instructions:  INCREASE YOUR VALSARTAN TO 20 MG TWICE A DAY   *If you need a refill on your cardiac medications before your next appointment, please call your pharmacy*  Lab Work: BMET IN 1 WEEK   If you have labs (blood work) drawn today and your tests are completely normal, you will receive your results only by: [place] (if you have MyChart) OR A paper copy in the mail If you have any lab test that is abnormal or we need to change your treatment, we will call you to review the results.  Testing/Procedures: NONE  Follow-Up: At [facility], you and your health needs are our priority.  As part of our continuing mission to provide you with exceptional heart care, we have created designated Provider Care Teams.  These Care Teams include your primary Cardiologist (physician) and Advanced Practice Providers (APPs -  Physician Assistants and Nurse Practitioners) who all work together to provide you with the care you need, when you need it.  We recommend signing up for the patient portal called "MyChart".  Sign up information is provided on this After Visit Summary.  MyChart is used to connect with patients for Virtual Visits (Telemedicine).  Patients are able to view lab/test results, encounter notes, upcoming appointments, etc.  Non-urgent messages can be sent to your provider as well.   To learn more about what you can do with MyChart, go to [email or web address].    Your next appointment:   06/22/2021 at 10:00 with Dr Oval Linsey

## 2021-04-25 NOTE — Assessment & Plan Note (Signed)
Non-obstructive disease on cath in 2018.  Continue aspirin and atorvastatin.

## 2021-04-25 NOTE — Assessment & Plan Note (Signed)
Stable.  She has patient prosthesis mismatch, but mean gradient continues to decrease.  It was 19 mmHg on her echo 10/2020.  She is asymptomatic.

## 2021-04-25 NOTE — Assessment & Plan Note (Signed)
BP is above goal.  Her BP dropped quickly on higher dose of valsartan.  We will try 20mg  bid.  Continue amlodipine.  She will come for a BMP in a week.  Track BP at home and continue to exercise when PT ends.

## 2021-04-26 DIAGNOSIS — M5440 Lumbago with sciatica, unspecified side: Secondary | ICD-10-CM | POA: Diagnosis not present

## 2021-04-26 DIAGNOSIS — R262 Difficulty in walking, not elsewhere classified: Secondary | ICD-10-CM | POA: Diagnosis not present

## 2021-04-26 DIAGNOSIS — M5137 Other intervertebral disc degeneration, lumbosacral region: Secondary | ICD-10-CM | POA: Diagnosis not present

## 2021-04-26 DIAGNOSIS — M6281 Muscle weakness (generalized): Secondary | ICD-10-CM | POA: Diagnosis not present

## 2021-04-27 ENCOUNTER — Other Ambulatory Visit: Payer: Self-pay | Admitting: Internal Medicine

## 2021-04-27 NOTE — Telephone Encounter (Signed)
Please refill as per office routine med refill policy (all routine meds to be refilled for 3 mo or monthly (per pt preference) up to one year from last visit, then month to month grace period for 3 mo, then further med refills will have to be denied) ? ?

## 2021-05-03 DIAGNOSIS — R262 Difficulty in walking, not elsewhere classified: Secondary | ICD-10-CM | POA: Diagnosis not present

## 2021-05-03 DIAGNOSIS — M5137 Other intervertebral disc degeneration, lumbosacral region: Secondary | ICD-10-CM | POA: Diagnosis not present

## 2021-05-03 DIAGNOSIS — M5440 Lumbago with sciatica, unspecified side: Secondary | ICD-10-CM | POA: Diagnosis not present

## 2021-05-03 DIAGNOSIS — M6281 Muscle weakness (generalized): Secondary | ICD-10-CM | POA: Diagnosis not present

## 2021-05-04 DIAGNOSIS — I10 Essential (primary) hypertension: Secondary | ICD-10-CM | POA: Diagnosis not present

## 2021-05-04 DIAGNOSIS — Z5181 Encounter for therapeutic drug level monitoring: Secondary | ICD-10-CM | POA: Diagnosis not present

## 2021-05-04 LAB — BASIC METABOLIC PANEL
BUN/Creatinine Ratio: 16 (ref 12–28)
BUN: 12 mg/dL (ref 8–27)
CO2: 25 mmol/L (ref 20–29)
Calcium: 9.6 mg/dL (ref 8.7–10.3)
Chloride: 107 mmol/L — ABNORMAL HIGH (ref 96–106)
Creatinine, Ser: 0.75 mg/dL (ref 0.57–1.00)
Glucose: 102 mg/dL — ABNORMAL HIGH (ref 70–99)
Potassium: 4 mmol/L (ref 3.5–5.2)
Sodium: 145 mmol/L — ABNORMAL HIGH (ref 134–144)
eGFR: 83 mL/min/{1.73_m2} (ref >59)

## 2021-05-08 DIAGNOSIS — M5137 Other intervertebral disc degeneration, lumbosacral region: Secondary | ICD-10-CM | POA: Diagnosis not present

## 2021-05-08 DIAGNOSIS — M5440 Lumbago with sciatica, unspecified side: Secondary | ICD-10-CM | POA: Diagnosis not present

## 2021-05-08 DIAGNOSIS — M6281 Muscle weakness (generalized): Secondary | ICD-10-CM | POA: Diagnosis not present

## 2021-05-08 DIAGNOSIS — R262 Difficulty in walking, not elsewhere classified: Secondary | ICD-10-CM | POA: Diagnosis not present

## 2021-05-10 DIAGNOSIS — H52202 Unspecified astigmatism, left eye: Secondary | ICD-10-CM | POA: Diagnosis not present

## 2021-05-10 DIAGNOSIS — Z961 Presence of intraocular lens: Secondary | ICD-10-CM | POA: Diagnosis not present

## 2021-05-10 DIAGNOSIS — H04123 Dry eye syndrome of bilateral lacrimal glands: Secondary | ICD-10-CM | POA: Diagnosis not present

## 2021-05-10 DIAGNOSIS — E113291 Type 2 diabetes mellitus with mild nonproliferative diabetic retinopathy without macular edema, right eye: Secondary | ICD-10-CM | POA: Diagnosis not present

## 2021-05-11 DIAGNOSIS — M5137 Other intervertebral disc degeneration, lumbosacral region: Secondary | ICD-10-CM | POA: Diagnosis not present

## 2021-05-11 DIAGNOSIS — R262 Difficulty in walking, not elsewhere classified: Secondary | ICD-10-CM | POA: Diagnosis not present

## 2021-05-11 DIAGNOSIS — M5440 Lumbago with sciatica, unspecified side: Secondary | ICD-10-CM | POA: Diagnosis not present

## 2021-05-11 DIAGNOSIS — M6281 Muscle weakness (generalized): Secondary | ICD-10-CM | POA: Diagnosis not present

## 2021-05-16 DIAGNOSIS — M5137 Other intervertebral disc degeneration, lumbosacral region: Secondary | ICD-10-CM | POA: Diagnosis not present

## 2021-05-16 DIAGNOSIS — M6281 Muscle weakness (generalized): Secondary | ICD-10-CM | POA: Diagnosis not present

## 2021-05-16 DIAGNOSIS — R262 Difficulty in walking, not elsewhere classified: Secondary | ICD-10-CM | POA: Diagnosis not present

## 2021-05-16 DIAGNOSIS — M5440 Lumbago with sciatica, unspecified side: Secondary | ICD-10-CM | POA: Diagnosis not present

## 2021-05-18 DIAGNOSIS — M5137 Other intervertebral disc degeneration, lumbosacral region: Secondary | ICD-10-CM | POA: Diagnosis not present

## 2021-05-18 DIAGNOSIS — R262 Difficulty in walking, not elsewhere classified: Secondary | ICD-10-CM | POA: Diagnosis not present

## 2021-05-18 DIAGNOSIS — M6281 Muscle weakness (generalized): Secondary | ICD-10-CM | POA: Diagnosis not present

## 2021-05-18 DIAGNOSIS — M5440 Lumbago with sciatica, unspecified side: Secondary | ICD-10-CM | POA: Diagnosis not present

## 2021-05-23 DIAGNOSIS — M5137 Other intervertebral disc degeneration, lumbosacral region: Secondary | ICD-10-CM | POA: Diagnosis not present

## 2021-05-23 DIAGNOSIS — M5440 Lumbago with sciatica, unspecified side: Secondary | ICD-10-CM | POA: Diagnosis not present

## 2021-05-23 DIAGNOSIS — R262 Difficulty in walking, not elsewhere classified: Secondary | ICD-10-CM | POA: Diagnosis not present

## 2021-05-23 DIAGNOSIS — M6281 Muscle weakness (generalized): Secondary | ICD-10-CM | POA: Diagnosis not present

## 2021-05-30 DIAGNOSIS — R262 Difficulty in walking, not elsewhere classified: Secondary | ICD-10-CM | POA: Diagnosis not present

## 2021-05-30 DIAGNOSIS — M6281 Muscle weakness (generalized): Secondary | ICD-10-CM | POA: Diagnosis not present

## 2021-05-30 DIAGNOSIS — M5440 Lumbago with sciatica, unspecified side: Secondary | ICD-10-CM | POA: Diagnosis not present

## 2021-05-30 DIAGNOSIS — M5137 Other intervertebral disc degeneration, lumbosacral region: Secondary | ICD-10-CM | POA: Diagnosis not present

## 2021-06-01 DIAGNOSIS — M5137 Other intervertebral disc degeneration, lumbosacral region: Secondary | ICD-10-CM | POA: Diagnosis not present

## 2021-06-01 DIAGNOSIS — M5440 Lumbago with sciatica, unspecified side: Secondary | ICD-10-CM | POA: Diagnosis not present

## 2021-06-01 DIAGNOSIS — R262 Difficulty in walking, not elsewhere classified: Secondary | ICD-10-CM | POA: Diagnosis not present

## 2021-06-01 DIAGNOSIS — M6281 Muscle weakness (generalized): Secondary | ICD-10-CM | POA: Diagnosis not present

## 2021-06-08 DIAGNOSIS — M6281 Muscle weakness (generalized): Secondary | ICD-10-CM | POA: Diagnosis not present

## 2021-06-08 DIAGNOSIS — R262 Difficulty in walking, not elsewhere classified: Secondary | ICD-10-CM | POA: Diagnosis not present

## 2021-06-08 DIAGNOSIS — M5137 Other intervertebral disc degeneration, lumbosacral region: Secondary | ICD-10-CM | POA: Diagnosis not present

## 2021-06-08 DIAGNOSIS — M5440 Lumbago with sciatica, unspecified side: Secondary | ICD-10-CM | POA: Diagnosis not present

## 2021-06-15 DIAGNOSIS — M5440 Lumbago with sciatica, unspecified side: Secondary | ICD-10-CM | POA: Diagnosis not present

## 2021-06-15 DIAGNOSIS — R262 Difficulty in walking, not elsewhere classified: Secondary | ICD-10-CM | POA: Diagnosis not present

## 2021-06-15 DIAGNOSIS — M5137 Other intervertebral disc degeneration, lumbosacral region: Secondary | ICD-10-CM | POA: Diagnosis not present

## 2021-06-15 DIAGNOSIS — M6281 Muscle weakness (generalized): Secondary | ICD-10-CM | POA: Diagnosis not present

## 2021-06-20 DIAGNOSIS — M6281 Muscle weakness (generalized): Secondary | ICD-10-CM | POA: Diagnosis not present

## 2021-06-20 DIAGNOSIS — R262 Difficulty in walking, not elsewhere classified: Secondary | ICD-10-CM | POA: Diagnosis not present

## 2021-06-20 DIAGNOSIS — M5440 Lumbago with sciatica, unspecified side: Secondary | ICD-10-CM | POA: Diagnosis not present

## 2021-06-20 DIAGNOSIS — M5137 Other intervertebral disc degeneration, lumbosacral region: Secondary | ICD-10-CM | POA: Diagnosis not present

## 2021-06-22 ENCOUNTER — Ambulatory Visit (INDEPENDENT_AMBULATORY_CARE_PROVIDER_SITE_OTHER): Payer: Medicare Other | Admitting: Cardiovascular Disease

## 2021-06-22 ENCOUNTER — Other Ambulatory Visit: Payer: Self-pay

## 2021-06-22 ENCOUNTER — Encounter: Payer: Self-pay | Admitting: Internal Medicine

## 2021-06-22 VITALS — BP 156/86 | HR 87 | Ht 61.0 in | Wt 221.6 lb

## 2021-06-22 DIAGNOSIS — Z5181 Encounter for therapeutic drug level monitoring: Secondary | ICD-10-CM | POA: Diagnosis not present

## 2021-06-22 DIAGNOSIS — R400 Somnolence: Secondary | ICD-10-CM

## 2021-06-22 DIAGNOSIS — R0609 Other forms of dyspnea: Secondary | ICD-10-CM

## 2021-06-22 DIAGNOSIS — I10 Essential (primary) hypertension: Secondary | ICD-10-CM

## 2021-06-22 DIAGNOSIS — R262 Difficulty in walking, not elsewhere classified: Secondary | ICD-10-CM | POA: Diagnosis not present

## 2021-06-22 DIAGNOSIS — E7800 Pure hypercholesterolemia, unspecified: Secondary | ICD-10-CM | POA: Diagnosis not present

## 2021-06-22 DIAGNOSIS — I350 Nonrheumatic aortic (valve) stenosis: Secondary | ICD-10-CM | POA: Diagnosis not present

## 2021-06-22 DIAGNOSIS — M5137 Other intervertebral disc degeneration, lumbosacral region: Secondary | ICD-10-CM | POA: Diagnosis not present

## 2021-06-22 DIAGNOSIS — I2584 Coronary atherosclerosis due to calcified coronary lesion: Secondary | ICD-10-CM | POA: Diagnosis not present

## 2021-06-22 DIAGNOSIS — R600 Localized edema: Secondary | ICD-10-CM | POA: Diagnosis not present

## 2021-06-22 DIAGNOSIS — M6281 Muscle weakness (generalized): Secondary | ICD-10-CM | POA: Diagnosis not present

## 2021-06-22 DIAGNOSIS — I2510 Atherosclerotic heart disease of native coronary artery without angina pectoris: Secondary | ICD-10-CM

## 2021-06-22 DIAGNOSIS — R0683 Snoring: Secondary | ICD-10-CM | POA: Diagnosis not present

## 2021-06-22 DIAGNOSIS — M5440 Lumbago with sciatica, unspecified side: Secondary | ICD-10-CM | POA: Diagnosis not present

## 2021-06-22 DIAGNOSIS — R609 Edema, unspecified: Secondary | ICD-10-CM | POA: Diagnosis not present

## 2021-06-22 MED ORDER — VALSARTAN 40 MG PO TABS: 40.0000 mg | ORAL_TABLET | Freq: Two times a day (BID) | ORAL | 3 refills | Status: DC

## 2021-06-22 MED ORDER — AMLODIPINE BESYLATE 5 MG PO TABS: 5.0000 mg | ORAL_TABLET | Freq: Every day | ORAL | 3 refills | Status: DC
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
## 2021-06-22 NOTE — Patient Instructions (Addendum)
Medication Instructions:  INCREASE YOUR VALSARTAN TO FULL TABLET TWICE A DAY   DECREASE YOUR AMLODIPINE TO 5 MG DAILY   Labwork: BNP TODAY   BMET IN 1 WEEK   Testing/Procedures: HOME SLEEP STUDY   Follow-Up:  07/19/2021 10:00 am

## 2021-06-23 LAB — BRAIN NATRIURETIC PEPTIDE: BNP: 56.9 pg/mL (ref 0.0–100.0)

## 2021-06-27 DIAGNOSIS — R262 Difficulty in walking, not elsewhere classified: Secondary | ICD-10-CM | POA: Diagnosis not present

## 2021-06-27 DIAGNOSIS — M5137 Other intervertebral disc degeneration, lumbosacral region: Secondary | ICD-10-CM | POA: Diagnosis not present

## 2021-06-27 DIAGNOSIS — M5440 Lumbago with sciatica, unspecified side: Secondary | ICD-10-CM | POA: Diagnosis not present

## 2021-06-27 DIAGNOSIS — M6281 Muscle weakness (generalized): Secondary | ICD-10-CM | POA: Diagnosis not present

## 2021-06-28 DIAGNOSIS — Z5181 Encounter for therapeutic drug level monitoring: Secondary | ICD-10-CM | POA: Diagnosis not present

## 2021-06-29 DIAGNOSIS — M6281 Muscle weakness (generalized): Secondary | ICD-10-CM | POA: Diagnosis not present

## 2021-06-29 DIAGNOSIS — R262 Difficulty in walking, not elsewhere classified: Secondary | ICD-10-CM | POA: Diagnosis not present

## 2021-06-29 DIAGNOSIS — M5137 Other intervertebral disc degeneration, lumbosacral region: Secondary | ICD-10-CM | POA: Diagnosis not present

## 2021-06-29 DIAGNOSIS — M5440 Lumbago with sciatica, unspecified side: Secondary | ICD-10-CM | POA: Diagnosis not present

## 2021-06-29 LAB — BASIC METABOLIC PANEL
BUN/Creatinine Ratio: 13 (ref 12–28)
BUN: 10 mg/dL (ref 8–27)
CO2: 26 mmol/L (ref 20–29)
Calcium: 9.8 mg/dL (ref 8.7–10.3)
Chloride: 105 mmol/L (ref 96–106)
Creatinine, Ser: 0.75 mg/dL (ref 0.57–1.00)
Glucose: 94 mg/dL (ref 70–99)
Potassium: 4.2 mmol/L (ref 3.5–5.2)
Sodium: 144 mmol/L (ref 134–144)
eGFR: 83 mL/min/{1.73_m2} (ref >59)

## 2021-07-04 DIAGNOSIS — R262 Difficulty in walking, not elsewhere classified: Secondary | ICD-10-CM | POA: Diagnosis not present

## 2021-07-04 DIAGNOSIS — M6281 Muscle weakness (generalized): Secondary | ICD-10-CM | POA: Diagnosis not present

## 2021-07-04 DIAGNOSIS — M5440 Lumbago with sciatica, unspecified side: Secondary | ICD-10-CM | POA: Diagnosis not present

## 2021-07-04 DIAGNOSIS — M5137 Other intervertebral disc degeneration, lumbosacral region: Secondary | ICD-10-CM | POA: Diagnosis not present

## 2021-07-05 ENCOUNTER — Encounter (HOSPITAL_BASED_OUTPATIENT_CLINIC_OR_DEPARTMENT_OTHER): Payer: Self-pay | Admitting: Cardiovascular Disease

## 2021-07-05 DIAGNOSIS — Z6839 Body mass index (BMI) 39.0-39.9, adult: Secondary | ICD-10-CM | POA: Diagnosis not present

## 2021-07-05 DIAGNOSIS — M5440 Lumbago with sciatica, unspecified side: Secondary | ICD-10-CM | POA: Diagnosis not present

## 2021-07-05 DIAGNOSIS — I10 Essential (primary) hypertension: Secondary | ICD-10-CM | POA: Diagnosis not present

## 2021-07-05 NOTE — Telephone Encounter (Signed)
Dr. Oval Linsey patient wanting to know if it is safe to take this vitamin   ? ? [email or web address] ? ?Please advise  ?

## 2021-07-06 DIAGNOSIS — M6281 Muscle weakness (generalized): Secondary | ICD-10-CM | POA: Diagnosis not present

## 2021-07-06 DIAGNOSIS — R262 Difficulty in walking, not elsewhere classified: Secondary | ICD-10-CM | POA: Diagnosis not present

## 2021-07-06 DIAGNOSIS — M5440 Lumbago with sciatica, unspecified side: Secondary | ICD-10-CM | POA: Diagnosis not present

## 2021-07-06 DIAGNOSIS — M5137 Other intervertebral disc degeneration, lumbosacral region: Secondary | ICD-10-CM | POA: Diagnosis not present

## 2021-07-11 DIAGNOSIS — M5137 Other intervertebral disc degeneration, lumbosacral region: Secondary | ICD-10-CM | POA: Diagnosis not present

## 2021-07-11 DIAGNOSIS — M6281 Muscle weakness (generalized): Secondary | ICD-10-CM | POA: Diagnosis not present

## 2021-07-11 DIAGNOSIS — R262 Difficulty in walking, not elsewhere classified: Secondary | ICD-10-CM | POA: Diagnosis not present

## 2021-07-11 DIAGNOSIS — M5440 Lumbago with sciatica, unspecified side: Secondary | ICD-10-CM | POA: Diagnosis not present

## 2021-07-13 ENCOUNTER — Encounter (HOSPITAL_BASED_OUTPATIENT_CLINIC_OR_DEPARTMENT_OTHER): Payer: Self-pay | Admitting: Cardiovascular Disease

## 2021-07-13 DIAGNOSIS — R262 Difficulty in walking, not elsewhere classified: Secondary | ICD-10-CM | POA: Diagnosis not present

## 2021-07-13 DIAGNOSIS — R0683 Snoring: Secondary | ICD-10-CM | POA: Insufficient documentation

## 2021-07-13 DIAGNOSIS — M5137 Other intervertebral disc degeneration, lumbosacral region: Secondary | ICD-10-CM | POA: Diagnosis not present

## 2021-07-13 DIAGNOSIS — M5440 Lumbago with sciatica, unspecified side: Secondary | ICD-10-CM | POA: Diagnosis not present

## 2021-07-13 DIAGNOSIS — R600 Localized edema: Secondary | ICD-10-CM

## 2021-07-13 DIAGNOSIS — M6281 Muscle weakness (generalized): Secondary | ICD-10-CM | POA: Diagnosis not present

## 2021-07-13 HISTORY — DX: Snoring: R06.83

## 2021-07-13 HISTORY — DX: Localized edema: R60.0

## 2021-07-13 NOTE — Assessment & Plan Note (Addendum)
This has been chronic.  I think amlodipine may be contributing.  We will reduce amlodipine to 5 mg.  Continue Lasix.  Encouraged her to wear compression socks.  We will also check a BNP today. ?

## 2021-07-13 NOTE — Assessment & Plan Note (Signed)
She has snoring and daytime somnolence.  Pulmonary pressures are elevated.  This may also be due to elevated left-sided pressures.  However we will also get a sleep study to assess for sleep apnea. ?

## 2021-07-13 NOTE — Assessment & Plan Note (Signed)
Asymptomatic.  Continue aspirin and atorvastatin. ?

## 2021-07-13 NOTE — Assessment & Plan Note (Addendum)
Blood pressures well controlled and she has lower extremity edema.  Discussed wearing compression stockings and trying to get up and walk around some while she is sitting at work.  The amlodipine may also be contributing.  We will reduce amlodipine to 5 mg.  We will increase valsartan to 40 mg twice daily.  Keep checking blood pressures and bring to follow-up.  Check a BMP in 1 week.  ?

## 2021-07-13 NOTE — Assessment & Plan Note (Signed)
Continue atorvastatin

## 2021-07-13 NOTE — Assessment & Plan Note (Signed)
S/p TAVR 08/2016.  She is doing well and the valve has been stable.  ?

## 2021-07-18 DIAGNOSIS — M5440 Lumbago with sciatica, unspecified side: Secondary | ICD-10-CM | POA: Diagnosis not present

## 2021-07-18 DIAGNOSIS — M5137 Other intervertebral disc degeneration, lumbosacral region: Secondary | ICD-10-CM | POA: Diagnosis not present

## 2021-07-18 DIAGNOSIS — M6281 Muscle weakness (generalized): Secondary | ICD-10-CM | POA: Diagnosis not present

## 2021-07-18 DIAGNOSIS — R262 Difficulty in walking, not elsewhere classified: Secondary | ICD-10-CM | POA: Diagnosis not present

## 2021-07-19 ENCOUNTER — Ambulatory Visit (INDEPENDENT_AMBULATORY_CARE_PROVIDER_SITE_OTHER): Payer: Medicare Other | Admitting: Cardiovascular Disease

## 2021-07-19 ENCOUNTER — Encounter (HOSPITAL_BASED_OUTPATIENT_CLINIC_OR_DEPARTMENT_OTHER): Payer: Self-pay | Admitting: Cardiovascular Disease

## 2021-07-19 ENCOUNTER — Other Ambulatory Visit: Payer: Self-pay

## 2021-07-19 VITALS — BP 128/84 | HR 83 | Ht 61.0 in | Wt 221.1 lb

## 2021-07-19 DIAGNOSIS — I2584 Coronary atherosclerosis due to calcified coronary lesion: Secondary | ICD-10-CM

## 2021-07-19 DIAGNOSIS — Z952 Presence of prosthetic heart valve: Secondary | ICD-10-CM

## 2021-07-19 DIAGNOSIS — I2510 Atherosclerotic heart disease of native coronary artery without angina pectoris: Secondary | ICD-10-CM

## 2021-07-19 DIAGNOSIS — I10 Essential (primary) hypertension: Secondary | ICD-10-CM | POA: Diagnosis not present

## 2021-07-19 DIAGNOSIS — E7800 Pure hypercholesterolemia, unspecified: Secondary | ICD-10-CM

## 2021-07-19 NOTE — Assessment & Plan Note (Signed)
Continue physical therapy and increase exercise when it ends.  Keep working on diet. ?
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
## 2021-07-19 NOTE — Assessment & Plan Note (Signed)
Prior TAVR in 2018.  She has patient prosthesis mismatch but the valve is functioning well.  Volume status is improved and her breathing is stable.  Repeat echo in 6 months.  Mean gradient was 19 mmHg 10/2020.  This was down from prior. ?

## 2021-07-19 NOTE — Assessment & Plan Note (Signed)
Blood pressure is much better since adding valsartan.  Renal function and potassium are stable.  Continue amlodipine, Lasix, and valsartan. ?

## 2021-07-19 NOTE — Assessment & Plan Note (Addendum)
Lipids are well controlled on atorvastatin.  She was encouraged to increase her exercise with physical therapy ends. ?

## 2021-07-19 NOTE — Patient Instructions (Signed)
Medication Instructions:  ?Your physician recommends that you continue on your current medications as directed. Please refer to the Current Medication list given to you today.  ? ?*If you need a refill on your cardiac medications before your next appointment, please call your pharmacy* ? ?Lab Work: ?NONE ? ?Testing/Procedures: ?Your physician has requested that you have an echocardiogram. Echocardiography is a painless test that uses sound waves to create images of your heart. It provides your doctor with information about the size and shape of your heart and how well your heart?s chambers and valves are working. This procedure takes approximately one hour. There are no restrictions for this procedure. ?IN 6 MONTHS  ? ?Follow-Up: ?At [facility], you and your health needs are our priority.  As part of our continuing mission to provide you with exceptional heart care, we have created designated Provider Care Teams.  These Care Teams include your primary Cardiologist (physician) and Advanced Practice Providers (APPs -  Physician Assistants and Nurse Practitioners) who all work together to provide you with the care you need, when you need it. ? ?We recommend signing up for the patient portal called "MyChart".  Sign up information is provided on this After Visit Summary.  MyChart is used to connect with patients for Virtual Visits (Telemedicine).  Patients are able to view lab/test results, encounter notes, upcoming appointments, etc.  Non-urgent messages can be sent to your provider as well.   ?To learn more about what you can do with MyChart, go to [email or web address].   ? ?Your next appointment:   ?AFTER ECHO 6 month(s) ? ?The format for your next appointment:   ?In Person ? ?Provider:   ?Skeet Latch, MD{ ? ? ? ?

## 2021-07-19 NOTE — Assessment & Plan Note (Signed)
She has non-obstructive coronary disease.  Lipids are well controlled.  Continue aspirin, atorvastatin, and amlodipine. ?

## 2021-07-20 DIAGNOSIS — M6281 Muscle weakness (generalized): Secondary | ICD-10-CM | POA: Diagnosis not present

## 2021-07-20 DIAGNOSIS — R262 Difficulty in walking, not elsewhere classified: Secondary | ICD-10-CM | POA: Diagnosis not present

## 2021-07-20 DIAGNOSIS — M5440 Lumbago with sciatica, unspecified side: Secondary | ICD-10-CM | POA: Diagnosis not present

## 2021-07-20 DIAGNOSIS — M5137 Other intervertebral disc degeneration, lumbosacral region: Secondary | ICD-10-CM | POA: Diagnosis not present

## 2021-07-25 DIAGNOSIS — R262 Difficulty in walking, not elsewhere classified: Secondary | ICD-10-CM | POA: Diagnosis not present

## 2021-07-25 DIAGNOSIS — M5440 Lumbago with sciatica, unspecified side: Secondary | ICD-10-CM | POA: Diagnosis not present

## 2021-07-25 DIAGNOSIS — M5137 Other intervertebral disc degeneration, lumbosacral region: Secondary | ICD-10-CM | POA: Diagnosis not present

## 2021-07-25 DIAGNOSIS — M6281 Muscle weakness (generalized): Secondary | ICD-10-CM | POA: Diagnosis not present

## 2021-07-27 DIAGNOSIS — M5440 Lumbago with sciatica, unspecified side: Secondary | ICD-10-CM | POA: Diagnosis not present

## 2021-07-27 DIAGNOSIS — M6281 Muscle weakness (generalized): Secondary | ICD-10-CM | POA: Diagnosis not present

## 2021-07-27 DIAGNOSIS — M5137 Other intervertebral disc degeneration, lumbosacral region: Secondary | ICD-10-CM | POA: Diagnosis not present

## 2021-07-27 DIAGNOSIS — R262 Difficulty in walking, not elsewhere classified: Secondary | ICD-10-CM | POA: Diagnosis not present

## 2021-07-30 ENCOUNTER — Other Ambulatory Visit: Payer: Self-pay | Admitting: Internal Medicine

## 2021-07-30 ENCOUNTER — Encounter: Payer: Self-pay | Admitting: Internal Medicine

## 2021-07-30 DIAGNOSIS — M6281 Muscle weakness (generalized): Secondary | ICD-10-CM | POA: Diagnosis not present

## 2021-07-30 DIAGNOSIS — M5137 Other intervertebral disc degeneration, lumbosacral region: Secondary | ICD-10-CM | POA: Diagnosis not present

## 2021-07-30 DIAGNOSIS — M5440 Lumbago with sciatica, unspecified side: Secondary | ICD-10-CM | POA: Diagnosis not present

## 2021-07-30 DIAGNOSIS — R262 Difficulty in walking, not elsewhere classified: Secondary | ICD-10-CM | POA: Diagnosis not present

## 2021-07-31 MED ORDER — ALBUTEROL SULFATE HFA 108 (90 BASE) MCG/ACT IN AERS: 2.0000 | INHALATION_SPRAY | Freq: Four times a day (QID) | RESPIRATORY_TRACT | 0 refills | Status: DC | PRN

## 2021-07-31 MED ORDER — ATORVASTATIN CALCIUM 40 MG PO TABS: 40.0000 mg | ORAL_TABLET | Freq: Every day | ORAL | 0 refills | Status: DC

## 2021-07-31 MED ORDER — FLUTICASONE PROPIONATE 50 MCG/ACT NA SUSP: 2.0000 | Freq: Every day | NASAL | 3 refills | Status: AC

## 2021-08-01 ENCOUNTER — Ambulatory Visit (INDEPENDENT_AMBULATORY_CARE_PROVIDER_SITE_OTHER): Payer: Medicare Other

## 2021-08-01 DIAGNOSIS — Z952 Presence of prosthetic heart valve: Secondary | ICD-10-CM | POA: Diagnosis not present

## 2021-08-01 LAB — ECHOCARDIOGRAM COMPLETE
AR max vel: 1.04 cm2
AV Area VTI: 1.03 cm2
AV Area mean vel: 1.01 cm2
AV Mean grad: 20.5 mmHg
AV Peak grad: 35.2 mmHg
Ao pk vel: 2.97 m/s
Area-P 1/2: 2.61 cm2
Calc EF: 52.2 %
P 1/2 time: 315 msec
S' Lateral: 2.81 cm
Single Plane A2C EF: 46.3 %
Single Plane A4C EF: 57.2 %

## 2021-08-03 DIAGNOSIS — M5137 Other intervertebral disc degeneration, lumbosacral region: Secondary | ICD-10-CM | POA: Diagnosis not present

## 2021-08-03 DIAGNOSIS — M6281 Muscle weakness (generalized): Secondary | ICD-10-CM | POA: Diagnosis not present

## 2021-08-03 DIAGNOSIS — R262 Difficulty in walking, not elsewhere classified: Secondary | ICD-10-CM | POA: Diagnosis not present

## 2021-08-03 DIAGNOSIS — M5440 Lumbago with sciatica, unspecified side: Secondary | ICD-10-CM | POA: Diagnosis not present

## 2021-08-07 ENCOUNTER — Ambulatory Visit (HOSPITAL_BASED_OUTPATIENT_CLINIC_OR_DEPARTMENT_OTHER): Payer: Medicare Other | Admitting: Cardiovascular Disease

## 2021-08-07 DIAGNOSIS — R0683 Snoring: Secondary | ICD-10-CM

## 2021-08-07 DIAGNOSIS — R400 Somnolence: Secondary | ICD-10-CM

## 2021-08-08 DIAGNOSIS — M6281 Muscle weakness (generalized): Secondary | ICD-10-CM | POA: Diagnosis not present

## 2021-08-08 DIAGNOSIS — R262 Difficulty in walking, not elsewhere classified: Secondary | ICD-10-CM | POA: Diagnosis not present

## 2021-08-08 DIAGNOSIS — M5137 Other intervertebral disc degeneration, lumbosacral region: Secondary | ICD-10-CM | POA: Diagnosis not present

## 2021-08-08 DIAGNOSIS — M5440 Lumbago with sciatica, unspecified side: Secondary | ICD-10-CM | POA: Diagnosis not present

## 2021-08-10 DIAGNOSIS — M5137 Other intervertebral disc degeneration, lumbosacral region: Secondary | ICD-10-CM | POA: Diagnosis not present

## 2021-08-10 DIAGNOSIS — R262 Difficulty in walking, not elsewhere classified: Secondary | ICD-10-CM | POA: Diagnosis not present

## 2021-08-10 DIAGNOSIS — M6281 Muscle weakness (generalized): Secondary | ICD-10-CM | POA: Diagnosis not present

## 2021-08-10 DIAGNOSIS — M5440 Lumbago with sciatica, unspecified side: Secondary | ICD-10-CM | POA: Diagnosis not present

## 2021-08-13 ENCOUNTER — Encounter: Payer: Self-pay | Admitting: Internal Medicine

## 2021-08-14 MED ORDER — POTASSIUM CHLORIDE ER 10 MEQ PO CPCR: ORAL_CAPSULE | ORAL | 0 refills | Status: DC

## 2021-08-14 MED ORDER — METFORMIN HCL 500 MG PO TABS: ORAL_TABLET | ORAL | 0 refills | Status: DC

## 2021-08-14 MED ORDER — FUROSEMIDE 40 MG PO TABS
40.0000 mg | ORAL_TABLET | Freq: Two times a day (BID) | ORAL | 0 refills | Status: DC
Start: 2021-08-14 — End: 2021-09-19

## 2021-08-15 ENCOUNTER — Ambulatory Visit (HOSPITAL_BASED_OUTPATIENT_CLINIC_OR_DEPARTMENT_OTHER): Payer: Medicare Other | Attending: Cardiovascular Disease | Admitting: Cardiovascular Disease

## 2021-08-15 DIAGNOSIS — R400 Somnolence: Secondary | ICD-10-CM | POA: Diagnosis not present

## 2021-08-15 DIAGNOSIS — G4733 Obstructive sleep apnea (adult) (pediatric): Secondary | ICD-10-CM | POA: Diagnosis not present

## 2021-08-15 DIAGNOSIS — R0683 Snoring: Secondary | ICD-10-CM

## 2021-08-15 DIAGNOSIS — G4710 Hypersomnia, unspecified: Secondary | ICD-10-CM

## 2021-08-17 ENCOUNTER — Encounter: Payer: Self-pay | Admitting: Internal Medicine

## 2021-08-20 ENCOUNTER — Ambulatory Visit: Payer: Medicare Other

## 2021-08-22 ENCOUNTER — Telehealth: Payer: Self-pay | Admitting: Internal Medicine

## 2021-08-22 ENCOUNTER — Encounter (HOSPITAL_BASED_OUTPATIENT_CLINIC_OR_DEPARTMENT_OTHER): Payer: Self-pay | Admitting: Cardiovascular Disease

## 2021-08-22 NOTE — Telephone Encounter (Signed)
Returning a call back from patient in regards to [facility] appointment that was missed on 08/20/21.  N/A unable to leave a message.  If patient calls back please schedule AWV with [facility]. ?

## 2021-08-22 NOTE — Procedures (Signed)
? ? ? ?  Patient Name: Denise [name], Denise [name] ?Study Date: 08/16/2021 ?Gender: Female ?D.O.B: 02-16-1946 ?Age (years): 23 ?Referring Provider: Skeet Latch ?Height (inches): 61 ?Interpreting Physician: Shelva Majestic MD, ABSM ?Weight (lbs): 221 ?RPSGT: Jacolyn Reedy ?BMI: 42 ?MRN: [id number] ?Neck Size: 14.00 ? ?CLINICAL INFORMATION ?Sleep Study Type: HST ? ?Indication for sleep study: Snoring, daytime somnolence, morbid obesity, hypertension ? ?Epworth Sleepiness Score: 5 ? ?SLEEP STUDY TECHNIQUE ?A multi-channel overnight portable sleep study was performed. The channels recorded were: nasal airflow, thoracic respiratory movement, and oxygen saturation with a pulse oximetry. Snoring was also monitored. ? ?MEDICATIONS ?acetaminophen (TYLENOL) 500 MG tablet ?albuterol (VENTOLIN HFA) 108 (90 Base) MCG/ACT inhaler ?amLODipine (NORVASC) 5 MG tablet ?amoxicillin (AMOXIL) 500 MG tablet ?aspirin 81 MG EC tablet ?atorvastatin (LIPITOR) 40 MG tablet ?budesonide-formoterol (SYMBICORT) 160-4.5 MCG/ACT inhaler ?Calcium Carb-Cholecalciferol (CALCIUM 600 + D PO) ?cetirizine (EQ ALLERGY RELIEF, CETIRIZINE,) 10 MG tablet ?cyclobenzaprine (FLEXERIL) 10 MG tablet ?diclofenac Sodium (VOLTAREN) 1 % GEL ?fluticasone (FLONASE) 50 MCG/ACT nasal spray ?furosemide (LASIX) 40 MG tablet ?metFORMIN (GLUCOPHAGE) 500 MG tablet ?Multiple Vitamins-Minerals (ALIVE WOMENS 50+) TABS ?Omega-3 Fatty Acids (FISH OIL) 1000 MG CAPS ?oxyCODONE 10 MG TABS ?Polyvinyl Alcohol-Povidone (REFRESH OP) ?potassium chloride (MICRO-K) 10 MEQ CR capsule ?valsartan (DIOVAN) 40 MG tablet  ?Patient self administered medications include: N/A. ? ?SLEEP ARCHITECTURE ?Patient was studied for 338.6 minutes. The sleep efficiency was 100.0 % and the patient was supine for 98.9%. The arousal index was 0.0 per hour. ? ?RESPIRATORY PARAMETERS ?The overall AHI was 14.2 per hour, with a central apnea index of 0 per hour; however, the severity during REM sleep cannot be  assessed on this home study. ? ?The oxygen nadir was 88% during sleep. ? ?CARDIAC DATA ?Mean heart rate during sleep was 73.1 bpm. HR range: 52 - 110 bpm. ? ?IMPRESSIONS ?- Mild obstructive sleep apnea occurred during this study (AHI 14.2/h). ?- Mild oxygen desaturation to a nadir of 88%. ?- Patient snored 0.6% during the sleep. ? ?DIAGNOSIS ?- Obstructive Sleep Apnea (G47.33) ? ?RECOMMENDATIONS ?- Therapeutic CPAP titration to determine optimal pressure required to alleviate sleep disordered breathing. Can initiate a trial of Auto - PAP with EPR of 3 at 6 - 18 cm of water.  ?- Effort should be made to optimize nasal and orophayngeal patency. ?- Positional therapy avoiding supine position during sleep. ?- If patient is against CPAP an alternative treatment with a customized oral appliance may be considered. ?- Avoid alcohol, sedatives and other CNS depressants that may worsen sleep apnea and disrupt normal sleep architecture. ?- Sleep hygiene should be reviewed to assess factors that may improve sleep quality. ?- Weight management (BMI 42) and regular exercise should be initiated or continued. ?- Recommend a download and sleep clinic evaluation after one month of therapy. ? ? ?[Electronically signed] 08/22/2021 10:10 AM ? ?Shelva Majestic MD, [facility] ABSM ?Diplomate, Tax adviser of Sleep Medicine ? ?NPI: [id number] ? ? ?[place] ?PH: [phone]   FX: [phone] ?ACCREDITED BY THE AMERICAN ACADEMY OF SLEEP MEDICINE ? ?

## 2021-08-23 ENCOUNTER — Encounter: Payer: Self-pay | Admitting: Internal Medicine

## 2021-08-23 ENCOUNTER — Telehealth: Payer: Self-pay | Admitting: *Deleted

## 2021-08-23 MED ORDER — AMOXICILLIN 500 MG PO TABS: 500.0000 mg | ORAL_TABLET | ORAL | 2 refills | Status: DC

## 2021-08-23 NOTE — Telephone Encounter (Signed)
Patient notified of HST results and recommendations. Sleep apnea and treatments explained to the patient. Patient made up several reasons why the test possibly resulted the way it did. I explained to her that none of the reasons presented to me would cause her test to be positive. She states that she does not want to do a CPAP machine, and she cannot afford to get a oral appliance. She plans to check out some other health issues first before she decides on getting a CPAP machine and call me back when she is ready to move forward. I explained to her that there's a time frame that she will need to be in or else she will need to have the sleep study repeated per insurance guidelines. Patient voiced understanding of what was told to her. ?

## 2021-08-23 NOTE — Telephone Encounter (Signed)
-----   Message from Troy Sine, MD sent at 08/22/2021 10:17 AM EDT ----- ?Mariann Laster, please notify pt of  results; can initiate a trial of Auto-PAP ?

## 2021-08-27 ENCOUNTER — Ambulatory Visit (INDEPENDENT_AMBULATORY_CARE_PROVIDER_SITE_OTHER): Payer: Medicare Other | Admitting: Internal Medicine

## 2021-08-27 ENCOUNTER — Encounter: Payer: Self-pay | Admitting: Internal Medicine

## 2021-08-27 VITALS — BP 182/90 | HR 73 | Temp 98.3°F | Ht 61.0 in | Wt 216.0 lb

## 2021-08-27 DIAGNOSIS — I2510 Atherosclerotic heart disease of native coronary artery without angina pectoris: Secondary | ICD-10-CM | POA: Diagnosis not present

## 2021-08-27 DIAGNOSIS — G4733 Obstructive sleep apnea (adult) (pediatric): Secondary | ICD-10-CM

## 2021-08-27 DIAGNOSIS — I10 Essential (primary) hypertension: Secondary | ICD-10-CM

## 2021-08-27 DIAGNOSIS — R070 Pain in throat: Secondary | ICD-10-CM | POA: Insufficient documentation

## 2021-08-27 DIAGNOSIS — H9191 Unspecified hearing loss, right ear: Secondary | ICD-10-CM | POA: Diagnosis not present

## 2021-08-27 DIAGNOSIS — R1310 Dysphagia, unspecified: Secondary | ICD-10-CM | POA: Diagnosis not present

## 2021-08-27 DIAGNOSIS — E119 Type 2 diabetes mellitus without complications: Secondary | ICD-10-CM

## 2021-08-27 DIAGNOSIS — I2584 Coronary atherosclerosis due to calcified coronary lesion: Secondary | ICD-10-CM | POA: Diagnosis not present

## 2021-08-27 NOTE — Progress Notes (Signed)
Patient consent obtained. ?Irrigation with water and peroxide performed on right ear. Full view of tympanic membranes after procedure.  ?Patient tolerated procedure well.  ? ?

## 2021-08-27 NOTE — Patient Instructions (Addendum)
Your right ear was cleared of wax today ? ?Please continue to monitor your  BP at home on a regular basis, with the goal being to be at least less than 140/90 ? ?Please continue all other medications as before, and refills have been done if requested. ? ?Please have the pharmacy call with any other refills you may need. ? ?Please keep your appointments with your specialists as you may have planned ? ?You will be contacted regarding the referral for: ENT, and GI ? ?Please make an Appointment to return in 3 months, or sooner if needed ?
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
## 2021-08-30 ENCOUNTER — Encounter: Payer: Self-pay | Admitting: Emergency Medicine

## 2021-08-30 ENCOUNTER — Encounter: Payer: Self-pay | Admitting: Internal Medicine

## 2021-08-30 ENCOUNTER — Encounter (HOSPITAL_BASED_OUTPATIENT_CLINIC_OR_DEPARTMENT_OTHER): Payer: Self-pay | Admitting: Cardiovascular Disease

## 2021-08-30 DIAGNOSIS — G4733 Obstructive sleep apnea (adult) (pediatric): Secondary | ICD-10-CM | POA: Insufficient documentation

## 2021-08-30 NOTE — Telephone Encounter (Signed)
FYI

## 2021-08-30 NOTE — Assessment & Plan Note (Signed)
Lab Results  ?Component Value Date  ? HGBA1C 6.2 04/17/2021  ? ?Stable, pt to continue current medical treatment metformin ? ?

## 2021-08-30 NOTE — Assessment & Plan Note (Signed)
Exam benign, etiology unclear, but will need r/o malignancy or other  - for ENT referral, as well as GI - may need EGD ?

## 2021-08-30 NOTE — Assessment & Plan Note (Signed)
Uncontrolled at this time, but to start CPAP soon per pt ?

## 2021-08-30 NOTE — Assessment & Plan Note (Signed)
BP Readings from Last 3 Encounters:  ?08/27/21 (!) 182/90  ?07/19/21 128/84  ?06/22/21 (!) 156/86  ? ?Uncontrolled here, but will defer to pt who declines change in tx for now, pt to continue medical treatment norvasc, diovan ? ?

## 2021-08-30 NOTE — Assessment & Plan Note (Signed)
?   Clinical significance- exam ok, but for ENT referral as above ?

## 2021-08-30 NOTE — Assessment & Plan Note (Signed)
Improved with wax impaction resolved with irrigation ? ?Ceruminosis is noted.  Wax is removed by syringing and manual debridement. Instructions for home care to prevent wax buildup are given. ? ? ?

## 2021-08-31 NOTE — Telephone Encounter (Signed)
Received the following message from patient:  ? ?"Dr Oval Linsey ordered a sleep study. It was done on 4/19. Thought you might want to take a look at  it, would love your feedback." ? ?I was able to find the sleep study under "chart review" and the encounter for sleep study on 08/15/21 by Dr. Shelva Majestic.  ? ?Dr. Lamonte Sakai, can you please advise? Thanks!  ?

## 2021-09-02 ENCOUNTER — Encounter (HOSPITAL_BASED_OUTPATIENT_CLINIC_OR_DEPARTMENT_OTHER): Payer: Self-pay | Admitting: Cardiovascular Disease

## 2021-09-03 NOTE — Telephone Encounter (Signed)
I am scheduled to go on a cruise 5/20-25, 2023, should I be concerned to pursue cpap when I return? ? ?Dr. Lamonte Sakai, please advise. Thanks ?

## 2021-09-03 NOTE — Telephone Encounter (Signed)
Please advise, I am unable to find results on patient sleep study test.  ?

## 2021-09-04 NOTE — Telephone Encounter (Signed)
I think it is okay for [place] to address the mild sleep apnea after she gets back.  Hope she enjoys the trip! ?

## 2021-09-14 ENCOUNTER — Encounter (HOSPITAL_BASED_OUTPATIENT_CLINIC_OR_DEPARTMENT_OTHER): Payer: Medicare Other | Admitting: Cardiovascular Disease

## 2021-09-18 ENCOUNTER — Other Ambulatory Visit: Payer: Self-pay | Admitting: Internal Medicine

## 2021-09-19 NOTE — Telephone Encounter (Signed)
Please refill as per office routine med refill policy (all routine meds to be refilled for 3 mo or monthly (per pt preference) up to one year from last visit, then month to month grace period for 3 mo, then further med refills will have to be denied) ? ?

## 2021-10-02 NOTE — Progress Notes (Signed)
Patient Care Team: Biagio Borg, MD as PCP - General Skeet Latch, MD as PCP - Cardiology (Cardiology) Sylvan Cheese, NP as Nurse Practitioner (Hematology and Oncology) Katy Apo, MD as Consulting Physician (Ophthalmology)  DIAGNOSIS:  Encounter Diagnosis  Name Primary?   Malignant neoplasm of upper-outer quadrant of right breast in female, estrogen receptor positive ([place])     SUMMARY OF ONCOLOGIC HISTORY: Oncology History  Breast cancer of upper-outer quadrant of right female breast ([facility])  09/18/2015 Initial Diagnosis   Screening right breast mass 9:00: 1 x 0.9 x 0.7 cm: Grade 2 IDC plus DCIS ER 100%, PR 90%, HER-2 negative ratio 1.38, Ki-67 10%; UOQ lesion 3 mm calcification plus distortion: Bx rec; T1 BN 0 stage IA clinical stage   10/17/2015 Surgery   Right lumpectomy ([facility]): IDC grade 2, 1.2 cm, intermediate grade DCIS, margins neg although < 0.1 cm to medial and superior margins, 0/1 LN negative, ER 100%, PR 90%, HER-2 negative ratio 1.37, T1 cN0 stage IA, Oncotype DX 15, 9% ROR, low risk   11/27/2015 - 01/15/2016 Radiation Therapy   Adjuvant radiation therapy [name]). Right breast: 50.4 Gy in 28 fractions. Right breast boost: 14 Gy in 7 fractions.    02/08/2016 -  Anti-estrogen oral therapy   Anastrozole 1 mg daily. Planned duration of therapy: 5 years.      CHIEF COMPLIANT: Follow-up of right breast cancer on anastrozole therapy  INTERVAL HISTORY: Denise [name] is a 76 y.o. with above-mentioned history of right breast cancer treated with lumpectomy, radiation, and who is currently on anastrozole therapy. She presents to the clinic today for a follow-up.  States that her health has been ok. States that she has no energy. States that she do have muscle spasm. Denies pain and discomfort in breast.  ALLERGIES:  is allergic to doxazosin, clonidine hydrochloride, erythromycin, hydrocodone-acetaminophen, pork-derived products, and rosiglitazone  maleate.  MEDICATIONS:  Current Outpatient Medications  Medication Sig Dispense Refill   acetaminophen (TYLENOL) 500 MG tablet Take 1,000 mg by mouth every 6 (six) hours as needed for moderate pain.     albuterol (VENTOLIN HFA) 108 (90 Base) MCG/ACT inhaler Inhale 2 puffs into the lungs every 6 (six) hours as needed for wheezing or shortness of breath. 9 g 0   amLODipine (NORVASC) 5 MG tablet Take 1 tablet (5 mg total) by mouth daily. 90 tablet 3   amoxicillin (AMOXIL) 500 MG tablet Take 1 tablet (500 mg total) by mouth as directed. Take 4 tablets 1 hour prior to procedure 4 tablet 2   amoxicillin (AMOXIL) 875 MG tablet Take 875 mg by mouth 2 (two) times daily.     aspirin 81 MG EC tablet Take 81 mg by mouth daily.     atorvastatin (LIPITOR) 40 MG tablet Take 1 tablet by mouth once daily 90 tablet 1   budesonide-formoterol (SYMBICORT) 160-4.5 MCG/ACT inhaler Inhale 2 puffs into the lungs 2 (two) times daily. 33 g 3   Calcium Carb-Cholecalciferol (CALCIUM 600 + D PO) Take 1 tablet by mouth daily.     cetirizine (EQ ALLERGY RELIEF, CETIRIZINE,) 10 MG tablet TAKE 1 TABLET BY MOUTH ONCE DAILY AS NEEDED FOR  ALLERGIES 90 tablet 3   cyclobenzaprine (FLEXERIL) 10 MG tablet Take 10 mg by mouth 3 (three) times daily as needed for muscle spasms.     diclofenac Sodium (VOLTAREN) 1 % GEL Apply 4 g topically 4 (four) times daily. 100 g 0   fluticasone (FLONASE) 50 MCG/ACT nasal spray Place 2  sprays into both nostrils daily. 16 g 3   furosemide (LASIX) 40 MG tablet Take 1 tablet (40 mg total) by mouth 2 (two) times daily. 180 tablet 1   metFORMIN (GLUCOPHAGE) 500 MG tablet TAKE 2 TABLETS BY MOUTH TWICE DAILY WITH A MEAL 360 tablet 1   Multiple Vitamins-Minerals (ALIVE WOMENS 50+) TABS Take 1 tablet by mouth daily.     Omega-3 Fatty Acids (FISH OIL) 1000 MG CAPS Take 1,000 mg by mouth daily.     oxyCODONE 10 MG TABS Take 1 tablet (10 mg total) by mouth every 3 (three) hours as needed for severe pain ((score 7  to 10)). 30 tablet 0   Polyvinyl Alcohol-Povidone (REFRESH OP) Place 1 drop into both eyes daily as needed (dry eyes).     potassium chloride (MICRO-K) 10 MEQ CR capsule TAKE 4 CAPSULES BY MOUTH ONCE DAILY Keep June appt for future refills 360 capsule 0   valsartan (DIOVAN) 40 MG tablet Take 1 tablet (40 mg total) by mouth 2 (two) times daily. 180 tablet 3   No current facility-administered medications for this visit.    PHYSICAL EXAMINATION: ECOG PERFORMANCE STATUS: 1 - Symptomatic but completely ambulatory  Vitals:   10/16/21 0933  BP: (!) 162/74  Pulse: 71  Resp: 19  Temp: (!) 97.5 F (36.4 C)  SpO2: 100%   Filed Weights   10/16/21 0933  Weight: 217 lb 8 oz (98.7 kg)    BREAST: Right breast fibrosis from scar tissue as well as some lymphedema. No palpable axillary supraclavicular or infraclavicular adenopathy no breast tenderness or nipple discharge. (exam performed in the presence of a chaperone)  LABORATORY DATA:  I have reviewed the data as listed    Latest Ref Rng & Units 10/09/2021   10:39 AM 06/28/2021    7:56 AM 05/04/2021    8:31 AM  CMP  Glucose 70 - 99 mg/dL 108  94  102   BUN 8 - 27 mg/dL _0 Creatinine 0.57 - 1.00 mg/dL 0.75  0.75  0.75   Sodium 134 - 144 mmol/L 143  144  145   Potassium 3.5 - 5.2 mmol/L 3.8  4.2  4.0   Chloride 96 - 106 mmol/L 104  105  107   CO2 20 - 29 mmol/L _1 Calcium 8.7 - 10.3 mg/dL 9.5  9.8  9.6     Lab Results  Component Value Date   WBC 9.2 10/09/2021   HGB 13.5 10/09/2021   HCT 41.3 10/09/2021   MCV 80 10/09/2021   PLT 221 10/09/2021   NEUTROABS 5.9 10/12/2020    ASSESSMENT & PLAN:  Breast cancer of upper-outer quadrant of right female breast ([place]) Right lumpectomy 10/16/2068: IDC grade 2, 1.2 cm, intermediate grade DCIS, margins negative although less than 0.1 cm to medial margin and superior margin, 0/1 lymph node negative, ER 100%, PR 90%, HER-2 negative ratio 1.37, T1 cN0 stage IA Oncotype DX score  15: Risk of recurrence 9% Adjuvant radiation: 11/27/2015- 01/15/2016   Current treatment: Anastrozole 1 mg daily started 02/08/2016- 10/17/20 (stopped after 5 years due to musculoskeletal AEs)   Diabetes: on metformin   Breast cancer surveillance:  1.  Breast exam 10/16/2021: Benign, scar tissue from prior surgeries. 2. Mammogram 05/23/2020: Breast density category C postsurgical and postradiation changes and large postoperative seroma  Encouraged her to call the breast center and get a mammogram    Return to clinic in  1 year for follow-up      No orders of the defined types were placed in this encounter.  The patient has a good understanding of the overall plan. she agrees with it. she will call with any problems that may develop before the next visit here. Total time spent: 30 mins including face to face time and time spent for planning, charting and co-ordination of care   Harriette Ohara, MD 10/16/21    I Gardiner Coins am scribing for Dr. Lindi Adie  I have reviewed the above documentation for accuracy and completeness, and I agree with the above.

## 2021-10-03 ENCOUNTER — Encounter: Payer: Self-pay | Admitting: Emergency Medicine

## 2021-10-03 ENCOUNTER — Encounter (HOSPITAL_BASED_OUTPATIENT_CLINIC_OR_DEPARTMENT_OTHER): Payer: Self-pay | Admitting: Cardiovascular Disease

## 2021-10-03 ENCOUNTER — Encounter: Payer: Self-pay | Admitting: Gastroenterology

## 2021-10-09 ENCOUNTER — Encounter (HOSPITAL_BASED_OUTPATIENT_CLINIC_OR_DEPARTMENT_OTHER): Payer: Self-pay | Admitting: Family

## 2021-10-09 ENCOUNTER — Ambulatory Visit (INDEPENDENT_AMBULATORY_CARE_PROVIDER_SITE_OTHER): Payer: Medicare Other | Admitting: Family

## 2021-10-09 VITALS — BP 141/76 | HR 83 | Ht 61.0 in | Wt 219.0 lb

## 2021-10-09 DIAGNOSIS — I6523 Occlusion and stenosis of bilateral carotid arteries: Secondary | ICD-10-CM | POA: Diagnosis not present

## 2021-10-09 DIAGNOSIS — Z952 Presence of prosthetic heart valve: Secondary | ICD-10-CM | POA: Diagnosis not present

## 2021-10-09 DIAGNOSIS — I10 Essential (primary) hypertension: Secondary | ICD-10-CM | POA: Diagnosis not present

## 2021-10-09 DIAGNOSIS — R42 Dizziness and giddiness: Secondary | ICD-10-CM

## 2021-10-09 NOTE — Patient Instructions (Addendum)
Medication Instructions:  Continue your current medications.   *If you need a refill on your cardiac medications before your next appointment, please call your pharmacy*   Lab Work: Your physician recommends that you return for lab work today: BMP, magnesium, CBC  If you have labs (blood work) drawn today and your tests are completely normal, you will receive your results only by: MyChart Message (if you have MyChart) OR A paper copy in the mail If you have any lab test that is abnormal or we need to change your treatment, we will call you to review the results.   Testing/Procedures: Your physician has requested that you have a carotid duplex. This test is an ultrasound of the carotid arteries in your neck. It looks at blood flow through these arteries that supply the brain with blood. Allow one hour for this exam. There are no restrictions or special instructions.   Your EKG today looked good!   Follow-Up: At [facility], you and your health needs are our priority.  As part of our continuing mission to provide you with exceptional heart care, we have created designated Provider Care Teams.  These Care Teams include your primary Cardiologist (physician) and Advanced Practice Providers (APPs -  Physician Assistants and Nurse Practitioners) who all work together to provide you with the care you need, when you need it.  We recommend signing up for the patient portal called "MyChart".  Sign up information is provided on this After Visit Summary.  MyChart is used to connect with patients for Virtual Visits (Telemedicine).  Patients are able to view lab/test results, encounter notes, upcoming appointments, etc.  Non-urgent messages can be sent to your provider as well.   To learn more about what you can do with MyChart, go to [email or web address].    Your next appointment:   As scheduled with Dr. Oval Linsey   Other Instructions Heart Healthy Diet Recommendations: A low-salt diet is  recommended. Meats should be grilled, baked, or boiled. Avoid fried foods. Focus on lean protein sources like fish or chicken with vegetables and fruits. The American Heart Association is a Microbiologist!  American Heart Association Diet and Lifeystyle Recommendations   Exercise recommendations: The American Heart Association recommends 150 minutes of moderate intensity exercise weekly. Try 30 minutes of moderate intensity exercise 4-5 times per week. This could include walking, jogging, or swimming.    Important Information About Sugar
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
## 2021-10-10 LAB — CBC
Hematocrit: 41.3 % (ref 34.0–46.6)
Hemoglobin: 13.5 g/dL (ref 11.1–15.9)
MCH: 26.2 pg — ABNORMAL LOW (ref 26.6–33.0)
MCHC: 32.7 g/dL (ref 31.5–35.7)
MCV: 80 fL (ref 79–97)
Platelets: 221 10*3/uL (ref 150–450)
RBC: 5.15 x10E6/uL (ref 3.77–5.28)
RDW: 15.5 % — ABNORMAL HIGH (ref 11.7–15.4)
WBC: 9.2 10*3/uL (ref 3.4–10.8)

## 2021-10-10 LAB — BASIC METABOLIC PANEL
BUN/Creatinine Ratio: 16 (ref 12–28)
BUN: 12 mg/dL (ref 8–27)
CO2: 24 mmol/L (ref 20–29)
Calcium: 9.5 mg/dL (ref 8.7–10.3)
Chloride: 104 mmol/L (ref 96–106)
Creatinine, Ser: 0.75 mg/dL (ref 0.57–1.00)
Glucose: 108 mg/dL — ABNORMAL HIGH (ref 70–99)
Potassium: 3.8 mmol/L (ref 3.5–5.2)
Sodium: 143 mmol/L (ref 134–144)
eGFR: 82 mL/min/{1.73_m2} (ref >59)

## 2021-10-10 LAB — MAGNESIUM: Magnesium: 1.6 mg/dL (ref 1.6–2.3)

## 2021-10-16 ENCOUNTER — Inpatient Hospital Stay: Payer: Medicare Other | Attending: Hematology and Oncology | Admitting: Hematology and Oncology

## 2021-10-16 ENCOUNTER — Other Ambulatory Visit: Payer: Self-pay

## 2021-10-16 DIAGNOSIS — Z7982 Long term (current) use of aspirin: Secondary | ICD-10-CM | POA: Insufficient documentation

## 2021-10-16 DIAGNOSIS — C50411 Malignant neoplasm of upper-outer quadrant of right female breast: Secondary | ICD-10-CM

## 2021-10-16 DIAGNOSIS — Z79811 Long term (current) use of aromatase inhibitors: Secondary | ICD-10-CM | POA: Insufficient documentation

## 2021-10-16 DIAGNOSIS — Z7951 Long term (current) use of inhaled steroids: Secondary | ICD-10-CM | POA: Diagnosis not present

## 2021-10-16 DIAGNOSIS — Z7984 Long term (current) use of oral hypoglycemic drugs: Secondary | ICD-10-CM | POA: Diagnosis not present

## 2021-10-16 DIAGNOSIS — Z79899 Other long term (current) drug therapy: Secondary | ICD-10-CM | POA: Insufficient documentation

## 2021-10-16 DIAGNOSIS — Z170 Estrogen receptor positive status [ER+]: Secondary | ICD-10-CM

## 2021-10-16 NOTE — Assessment & Plan Note (Addendum)
Right lumpectomy 10/16/2068: IDC grade 2, 1.2 cm, intermediate grade DCIS, margins negative although less than 0.1 cm to medial margin and superior margin, 0/1 lymph node negative, ER 100%, PR 90%, HER-2 negative ratio 1.37, T1 cN0 stage IA Oncotype DX score 15: Risk of recurrence 9% Adjuvant radiation: 11/27/2015-01/15/2016  Current treatment: Anastrozole 1 mg daily started 02/08/2016- 10/17/20 (stopped after 5 years due to musculoskeletal AEs)  Diabetes: on metformin  Breast cancer surveillance: 1.Breast exam 10/16/2021: Benign, scar tissue from prior surgeries. 2.Mammogram1/25/2022: Breast density categoryCpostsurgical and postradiation changes and large postoperative seroma  Encouraged her to call the breast center and get a mammogram  Return to clinic in 1 year for follow-up

## 2021-10-17 ENCOUNTER — Encounter: Payer: Self-pay | Admitting: Adult Health

## 2021-10-17 ENCOUNTER — Encounter: Payer: Self-pay | Admitting: Internal Medicine

## 2021-10-17 ENCOUNTER — Ambulatory Visit (INDEPENDENT_AMBULATORY_CARE_PROVIDER_SITE_OTHER): Payer: Medicare Other | Admitting: Internal Medicine

## 2021-10-17 ENCOUNTER — Ambulatory Visit (INDEPENDENT_AMBULATORY_CARE_PROVIDER_SITE_OTHER): Payer: Medicare Other | Admitting: Adult Health

## 2021-10-17 VITALS — BP 152/80 | HR 80 | Temp 98.3°F | Ht 61.0 in | Wt 217.0 lb

## 2021-10-17 VITALS — BP 138/78 | HR 65 | Temp 97.6°F | Ht 61.0 in | Wt 218.0 lb

## 2021-10-17 DIAGNOSIS — E7800 Pure hypercholesterolemia, unspecified: Secondary | ICD-10-CM | POA: Diagnosis not present

## 2021-10-17 DIAGNOSIS — G4733 Obstructive sleep apnea (adult) (pediatric): Secondary | ICD-10-CM

## 2021-10-17 DIAGNOSIS — H6981 Other specified disorders of Eustachian tube, right ear: Secondary | ICD-10-CM

## 2021-10-17 DIAGNOSIS — J4530 Mild persistent asthma, uncomplicated: Secondary | ICD-10-CM | POA: Diagnosis not present

## 2021-10-17 DIAGNOSIS — I10 Essential (primary) hypertension: Secondary | ICD-10-CM

## 2021-10-17 DIAGNOSIS — E559 Vitamin D deficiency, unspecified: Secondary | ICD-10-CM | POA: Diagnosis not present

## 2021-10-17 DIAGNOSIS — J309 Allergic rhinitis, unspecified: Secondary | ICD-10-CM | POA: Diagnosis not present

## 2021-10-17 DIAGNOSIS — R42 Dizziness and giddiness: Secondary | ICD-10-CM

## 2021-10-17 DIAGNOSIS — I6523 Occlusion and stenosis of bilateral carotid arteries: Secondary | ICD-10-CM

## 2021-10-17 DIAGNOSIS — E538 Deficiency of other specified B group vitamins: Secondary | ICD-10-CM | POA: Diagnosis not present

## 2021-10-17 DIAGNOSIS — E119 Type 2 diabetes mellitus without complications: Secondary | ICD-10-CM

## 2021-10-17 DIAGNOSIS — F4321 Adjustment disorder with depressed mood: Secondary | ICD-10-CM | POA: Diagnosis not present

## 2021-10-17 DIAGNOSIS — F411 Generalized anxiety disorder: Secondary | ICD-10-CM

## 2021-10-17 DIAGNOSIS — F419 Anxiety disorder, unspecified: Secondary | ICD-10-CM | POA: Diagnosis not present

## 2021-10-17 DIAGNOSIS — F32A Depression, unspecified: Secondary | ICD-10-CM

## 2021-10-17 LAB — BASIC METABOLIC PANEL
BUN: 15 mg/dL (ref 6–23)
CO2: 31 mEq/L (ref 19–32)
Calcium: 10 mg/dL (ref 8.4–10.5)
Chloride: 105 mEq/L (ref 96–112)
Creatinine, Ser: 0.81 mg/dL (ref 0.40–1.20)
GFR: 70.59 mL/min (ref >60.00)
Glucose, Bld: 123 mg/dL — ABNORMAL HIGH (ref 70–99)
Potassium: 3.8 mEq/L (ref 3.5–5.1)
Sodium: 143 mEq/L (ref 135–145)

## 2021-10-17 LAB — LIPID PANEL
Cholesterol: 166 mg/dL (ref 0–200)
HDL: 73.8 mg/dL (ref >39.00)
LDL Cholesterol: 78 mg/dL (ref 0–99)
NonHDL: 92.6
Total CHOL/HDL Ratio: 2
Triglycerides: 74 mg/dL (ref 0.0–149.0)
VLDL: 14.8 mg/dL (ref 0.0–40.0)

## 2021-10-17 LAB — VITAMIN B12: Vitamin B-12: 317 pg/mL (ref 211–911)

## 2021-10-17 LAB — VITAMIN D 25 HYDROXY (VIT D DEFICIENCY, FRACTURES): VITD: 61.31 ng/mL (ref 30.00–100.00)

## 2021-10-17 LAB — HEMOGLOBIN A1C: Hgb A1c MFr Bld: 6.1 % (ref 4.6–6.5)

## 2021-10-17 MED ORDER — MECLIZINE HCL 12.5 MG PO TABS: 12.5000 mg | ORAL_TABLET | Freq: Three times a day (TID) | ORAL | 1 refills | Status: AC | PRN

## 2021-10-17 NOTE — Assessment & Plan Note (Signed)
Continue on current regimen .   

## 2021-10-17 NOTE — Patient Instructions (Addendum)
Please take all new medication as prescribed  - the meclizine (antivert) for vertigo if returns  You can also take Mucinex (or it's generic off brand) for ear fullness and congestion, and tylenol as needed for pain.  Ok to take the Flonase every day, as well as the zyrtec.    You will be contacted regarding the referral for: counseling  Please continue all other medications as before, and refills have been done if requested.  Please have the pharmacy call with any other refills you may need.  Please continue your efforts at being more active, low cholesterol diabetic diet, and weight control.  Please keep your appointments with your specialists as you may have planned  Please go to the LAB at the blood drawing area for the tests to be done  You will be contacted by phone if any changes need to be made immediately.  Otherwise, you will receive a letter about your results with an explanation, but please check with MyChart first.  Please remember to sign up for MyChart if you have not done so, as this will be important to you in the future with finding out test results, communicating by private email, and scheduling acute appointments online when needed.  Please make an Appointment to return in 6 months, or sooner if needed
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
## 2021-10-17 NOTE — Patient Instructions (Signed)
Begin CPAP At bedtime, wear all night and with naps.  Try dream wear nasal  Work on healthy weight Do not drive if sleepy  Healthy sleep regimen   Continue on Symbicort Twice daily  , rinse after use Albuterol inhaler As needed     Follow up with Dr. Lamonte Sakai  or Shonice Wrisley NP in 3 months and As needed

## 2021-10-17 NOTE — Assessment & Plan Note (Signed)
Well-controlled on current regimen.  Trigger prevention.  Plan  Patient Instructions  Begin CPAP At bedtime, wear all night and with naps.  Try dream wear nasal  Work on healthy weight Do not drive if sleepy  Healthy sleep regimen   Continue on Symbicort Twice daily  , rinse after use Albuterol inhaler As needed     Follow up with Dr. Lamonte Sakai  or Sandie Swayze NP in 3 months and As needed

## 2021-10-17 NOTE — Assessment & Plan Note (Signed)
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  Patient education given on sleep apnea and treatment options  - discussed how weight can impact sleep and risk for sleep disordered breathing - discussed options to assist with weight loss: combination of diet modification, cardiovascular and strength training exercises   - had an extensive discussion regarding the adverse health consequences related to untreated sleep disordered breathing - specifically discussed the risks for hypertension, coronary artery disease, cardiac dysrhythmias, cerebrovascular disease, and diabetes - lifestyle modification discussed   - discussed how sleep disruption can increase risk of accidents, particularly when driving - safe driving practices were discussed   Will begin CPAP-AutoSet 5-15.  We went over different mask.  She would like to try the DreamWear nasal mask  Plan  Patient Instructions  Begin CPAP At bedtime, wear all night and with naps.  Try dream wear nasal  Work on healthy weight Do not drive if sleepy  Healthy sleep regimen   Continue on Symbicort Twice daily  , rinse after use Albuterol inhaler As needed     Follow up with Dr. Lamonte Sakai  or Darlena Koval NP in 3 months and As needed

## 2021-10-17 NOTE — Progress Notes (Signed)
$'@Patient'e$  ID: Denise [name], female    DOB: Jan 09, 1946, 76 y.o.   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  CT chest 10/09/2020  shows no mediastinal or hilar adenopathy.  The thyroid is asymmetric with right lobe enlargement and a masslike appearance, partially calcified 4.2 x 3.2 cm.  There are no suspicious appearing nodules or masses in the lungs.  10/17/2021 Follow up : Asthma, sleep study results Patient presents for follow-up visit.  Last seen September 2022.  Patient has underlying mild persistent asthma.  She is on Symbicort twice daily.  Patient says overall breathing is doing okay.  She denies any flare of cough or wheezing.  No increased albuterol use.  Has been having some intermittent dizziness and lightheadedness.  Patient has been seen by cardiology and has upcoming carotid Dopplers.  She was also seen by primary care and given meclizine for possible vertigo.  Recommended to use Flonase for possible postnasal drainage  Patient was recently set up for a home sleep study by her cardiology due to snoring and daytime sleepiness.  Home sleep study done on August 16, 2021 showed mild obstructive sleep apnea with AHI of 14.2/hour and SPO2 low at 88%.  We discussed her sleep study  results and went over treatment options including weight loss, oral appliance and CPAP.  Patient would like to proceed with CPAP.  Has significant dental issues following with dentist.  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    Allergies  Allergen Reactions   Doxazosin Nausea And Vomiting    Dizziness    Clonidine Hydrochloride Other (See Comments)    Bradycardia   Erythromycin Palpitations   Hydrocodone-Acetaminophen Nausea Only   Pork-Derived Products Diarrhea and Nausea Only   Rosiglitazone Maleate Swelling    SWELLING REACTION UNSPECIFIED     Immunization History  Administered Date(s) Administered   Fluad Quad(high Dose 65+) 03/04/2019, 04/13/2020, 04/17/2021   Influenza,inj,Quad PF,6+ Mos 03/25/2017   Influenza-Unspecified 10/13/2019   PFIZER(Purple Top)SARS-COV-2 Vaccination 06/25/2019, 07/22/2019, 03/11/2020, 11/05/2020, 07/11/2021   Pneumococcal Conjugate-13 02/08/2014   Pneumococcal Polysaccharide-23 09/28/2006, 07/29/2012, 02/15/2015   Td 09/12/2008   Tdap 10/20/2018    Past Medical History:  Diagnosis Date   Abdominal pain, left lower quadrant 09/12/2008   ALLERGIC RHINITIS 08/24/2007   Allergy    Anemia    past hx- not current    ANXIETY 08/24/2007   Aortic stenosis, severe 07/19/2016   ASTHMA 08/24/2007   ASTHMA, WITH ACUTE EXACERBATION 03/14/2008   Breast cancer ([place]) 2017   right- radiation only    Cataract    removed bilat    DDD (degenerative disc disease), lumbar    DEGENERATIVE JOINT DISEASE 08/24/2007   DEPRESSION 08/24/2007   DIABETES MELLITUS, TYPE II 08/24/2007   ECZEMA 08/24/2007   Edema 08/24/2007   GERD 08/24/2007   not current (  07/2014)- not current 2019   Heart murmur    HYPERCHOLESTEROLEMIA 08/24/2007   HYPERLIPIDEMIA 08/24/2007   HYPERTENSION 08/24/2007   Lower extremity edema 07/13/2021   Neuromuscular disorder (HCC)    numbness in feet - still has feeling   OBESITY 08/24/2007    OSTEOARTHRITIS, KNEES, BILATERAL, 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   Personal history of radiation therapy 2017   right breast ca   POSTMENOPAUSAL STATUS 08/24/2007   Right knee DJD 09/03/2010   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     albuterol (VENTOLIN HFA) 108 (90 Base) MCG/ACT inhaler Inhale 2 puffs into the lungs every 6 (six) hours as needed for wheezing or shortness of breath. 9 g 0   amLODipine (NORVASC) 5 MG tablet Take 1 tablet (5 mg total) by mouth daily. 90 tablet 3   amoxicillin (AMOXIL) 500 MG tablet Take 1 tablet (500 mg total) by mouth as directed. Take 4 tablets 1 hour prior to procedure 4 tablet 2   aspirin 81 MG EC tablet Take 81 mg by mouth daily.     atorvastatin (LIPITOR) 40 MG tablet Take 1 tablet by mouth once daily 90 tablet 1   budesonide-formoterol (SYMBICORT) 160-4.5 MCG/ACT inhaler Inhale 2 puffs into the lungs 2 (two) times daily. 33 g 3   Calcium Carb-Cholecalciferol (CALCIUM 600 + D PO) Take 1 tablet by mouth daily.     cetirizine (EQ ALLERGY RELIEF, CETIRIZINE,) 10 MG tablet TAKE 1 TABLET BY MOUTH ONCE DAILY AS NEEDED FOR  ALLERGIES 90 tablet 3   cyclobenzaprine (FLEXERIL) 10 MG tablet Take 10 mg by mouth 3 (three) times daily as needed for muscle spasms.     diclofenac Sodium (VOLTAREN) 1 % GEL Apply 4 g topically 4 (four) times daily. 100 g 0    fluticasone (FLONASE) 50 MCG/ACT nasal spray Place 2 sprays into both nostrils daily. 16 g 3   furosemide (LASIX) 40 MG tablet Take 1 tablet (40 mg total) by mouth 2 (two) times daily. 180 tablet 1   meclizine (ANTIVERT) 12.5 MG tablet Take 1 tablet (12.5 mg total) by mouth 3 (three) times daily as needed for dizziness. 40 tablet 1   metFORMIN (GLUCOPHAGE) 500 MG tablet TAKE 2 TABLETS BY MOUTH TWICE DAILY WITH A MEAL 360 tablet 1   Multiple Vitamins-Minerals (ALIVE WOMENS 50+) TABS Take 1 tablet by mouth daily.     Omega-3 Fatty Acids (FISH OIL) 1000 MG CAPS Take 1,000 mg by mouth daily.     oxyCODONE 10 MG TABS Take 1 tablet (10 mg total) by mouth every 3 (three) hours as needed for severe pain ((score 7 to 10)). 30 tablet 0   Polyvinyl Alcohol-Povidone (REFRESH OP) Place 1 drop into both eyes daily as needed (dry eyes).     potassium chloride (MICRO-K) 10 MEQ CR capsule TAKE 4 CAPSULES BY MOUTH ONCE  DAILY Keep June appt for future refills 360 capsule 0   valsartan (DIOVAN) 40 MG tablet Take 1 tablet (40 mg total) by mouth 2 (two) times daily. 180 tablet 3   No facility-administered medications prior to visit.     Review of Systems:   Constitutional:   No  weight loss, night sweats,  Fevers, chills, + fatigue, or  lassitude.  HEENT:   No headaches,  Difficulty swallowing,  Tooth/dental problems, or  Sore throat,                No sneezing, itching, ear ache,  +nasal congestion, post nasal drip,   CV:  No chest pain,  Orthopnea, PND, swelling in lower extremities, anasarca, dizziness, palpitations, syncope.   GI  No heartburn, indigestion, abdominal pain, nausea, vomiting, diarrhea, change in bowel habits, loss of appetite, bloody stools.   Resp: No shortness of breath with exertion or at rest.  No excess mucus, no productive cough,  No non-productive cough,  No coughing up of blood.  No change in color of mucus.  No wheezing.  No chest wall deformity  Skin: no rash or lesions.  GU:  no dysuria, change in color of urine, no urgency or frequency.  No flank pain, no hematuria   MS:  No joint pain or swelling.  No decreased range of motion.  No back pain.    Physical Exam  BP 138/78 (BP Location: Left Arm, Cuff Size: Large)   Pulse 65   Temp 97.6 F (36.4 C) (Temporal)   Ht '5\' 1"'$  (1.549 m)   Wt 218 lb (98.9 kg)   SpO2 98%   BMI 41.19 kg/m   GEN: A/Ox3; pleasant , NAD, well nourished    HEENT:  [place]/AT, NOSE-clear, THROAT-clear, no lesions, no postnasal drip or exudate noted. Poor dentition Class 2 MP airway   NECK:  Supple w/ fair ROM; no JVD; normal carotid impulses w/o bruits; no thyromegaly or nodules palpated; no lymphadenopathy.    RESP  Clear  P & A; w/o, wheezes/ rales/ or rhonchi. no accessory muscle use, no dullness to percussion  CARD:  RRR, Gr 2/6 SM , no peripheral edema, pulses intact, no cyanosis or clubbing.  GI:   Soft & nt; nml bowel sounds; no organomegaly or masses detected.   Musco: Warm bil, no deformities or joint swelling noted.   Neuro: alert, no focal deficits noted.    Skin: Warm, no lesions or rashes    Lab Results:  CBC    Component Value Date/Time   WBC 9.2 10/09/2021 1039   WBC 8.8 01/03/2021 1015   RBC 5.15 10/09/2021 1039   RBC 4.72 01/03/2021 1015   HGB 13.5 10/09/2021 1039   HGB 13.8 09/27/2015 0840   HCT 41.3 10/09/2021 1039   HCT 42.7 09/27/2015 0840   PLT 221 10/09/2021 1039   MCV 80 10/09/2021 1039   MCV 82.9 09/27/2015 0840   MCH 26.2 (L) 10/09/2021 1039   MCH 27.3 01/03/2021 1015   MCHC 32.7 10/09/2021 1039   MCHC 32.5 01/03/2021 1015   RDW 15.5 (H) 10/09/2021 1039   RDW 15.3 (H) 09/27/2015 0840   LYMPHSABS 3.2 10/12/2020 0853   LYMPHSABS 3.5 (H) 09/27/2015 0840   MONOABS 0.7 10/12/2020 0853   MONOABS 0.6 09/27/2015 0840   EOSABS 0.2 10/12/2020 0853   EOSABS 0.3 09/27/2015 0840   BASOSABS 0.1 10/12/2020 0853   BASOSABS 0.1 09/27/2015 0840    BMET    Component Value Date/Time  NA 143  10/09/2021 1039   NA 145 09/27/2015 0840   K 3.8 10/09/2021 1039   K 3.7 09/27/2015 0840   CL 104 10/09/2021 1039   CO2 24 10/09/2021 1039   CO2 27 09/27/2015 0840   GLUCOSE 108 (H) 10/09/2021 1039   GLUCOSE 105 (H) 04/17/2021 0839   GLUCOSE 136 09/27/2015 0840   BUN 12 10/09/2021 1039   BUN 8.8 09/27/2015 0840   CREATININE 0.75 10/09/2021 1039   CREATININE 0.8 09/27/2015 0840   CALCIUM 9.5 10/09/2021 1039   CALCIUM 9.2 09/27/2015 0840   GFRNONAA >60 01/03/2021 1015   GFRAA 95 03/07/2020 0843    BNP    Component Value Date/Time   BNP 56.9 06/22/2021 1103    ProBNP    Component Value Date/Time   PROBNP 22.0 01/06/2012 1706    Imaging: No results found.       Latest Ref Rng & Units 08/29/2016   10:07 AM  PFT Results  FVC-Pre L 1.94   FVC-Predicted Pre % 96   FVC-Post L 2.26   FVC-Predicted Post % 112   Pre FEV1/FVC % % 81   Post FEV1/FCV % % 78   FEV1-Pre L 1.56   FEV1-Predicted Pre % 101   FEV1-Post L 1.76   DLCO uncorrected ml/min/mmHg 16.36   DLCO UNC% % 80   DLCO corrected ml/min/mmHg 15.98   DLCO COR %Predicted % 79   DLVA Predicted % 101   TLC L 4.75   TLC % Predicted % 103   RV % Predicted % 126     No results found for: "NITRICOXIDE"      Assessment & Plan:   OSA (obstructive sleep apnea) 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  Patient education given on sleep apnea and treatment options  - discussed how weight can impact sleep and risk for sleep disordered breathing - discussed options to assist with weight loss: combination of diet modification, cardiovascular and strength training exercises   - had an extensive discussion regarding the adverse health consequences related to untreated sleep disordered breathing - specifically discussed the risks for hypertension, coronary artery disease, cardiac dysrhythmias, cerebrovascular disease, and diabetes - lifestyle modification discussed   - discussed how sleep  disruption can increase risk of accidents, particularly when driving - safe driving practices were discussed   Will begin CPAP-AutoSet 5-15.  We went over different mask.  She would like to try the DreamWear nasal mask  Plan  Patient Instructions  Begin CPAP At bedtime, wear all night and with naps.  Try dream wear nasal  Work on healthy weight Do not drive if sleepy  Healthy sleep regimen   Continue on Symbicort Twice daily  , rinse after use Albuterol inhaler As needed     Follow up with Dr. Lamonte Sakai  or Emersen Mascari NP in 3 months and As needed       Asthma Well-controlled on current regimen.  Trigger prevention.  Plan  Patient Instructions  Begin CPAP At bedtime, wear all night and with naps.  Try dream wear nasal  Work on healthy weight Do not drive if sleepy  Healthy sleep regimen   Continue on Symbicort Twice daily  , rinse after use Albuterol inhaler As needed     Follow up with Dr. Lamonte Sakai  or Uriyah Massimo NP in 3 months and As needed       Allergic rhinitis Continue on current regimen     Rexene Edison, NP 10/17/2021

## 2021-10-19 ENCOUNTER — Other Ambulatory Visit: Payer: Self-pay | Admitting: Hematology and Oncology

## 2021-10-19 DIAGNOSIS — Z1231 Encounter for screening mammogram for malignant neoplasm of breast: Secondary | ICD-10-CM

## 2021-10-21 ENCOUNTER — Encounter: Payer: Self-pay | Admitting: Internal Medicine

## 2021-10-21 DIAGNOSIS — R42 Dizziness and giddiness: Secondary | ICD-10-CM | POA: Insufficient documentation

## 2021-10-21 NOTE — Assessment & Plan Note (Signed)
Uncontrolled, cont zyrtec, add flonase asd

## 2021-10-21 NOTE — Assessment & Plan Note (Addendum)
Mild to mod worsening, declines med tx but for counseling referral

## 2021-10-23 ENCOUNTER — Ambulatory Visit (INDEPENDENT_AMBULATORY_CARE_PROVIDER_SITE_OTHER): Payer: Medicare Other

## 2021-10-23 DIAGNOSIS — R42 Dizziness and giddiness: Secondary | ICD-10-CM

## 2021-10-23 DIAGNOSIS — I6523 Occlusion and stenosis of bilateral carotid arteries: Secondary | ICD-10-CM

## 2021-10-23 DIAGNOSIS — E041 Nontoxic single thyroid nodule: Secondary | ICD-10-CM

## 2021-10-23 DIAGNOSIS — M542 Cervicalgia: Secondary | ICD-10-CM | POA: Diagnosis not present

## 2021-10-23 DIAGNOSIS — M5440 Lumbago with sciatica, unspecified side: Secondary | ICD-10-CM | POA: Diagnosis not present

## 2021-10-24 ENCOUNTER — Telehealth: Payer: Self-pay | Admitting: Internal Medicine

## 2021-10-24 NOTE — Progress Notes (Unsigned)
Ok to contact pt -   Incidental large thyroid nodule seen on the carotid u/s which may or may not be different from that checked last in June 2022  We should order a follow up thyroid u/s to see if any actual change from the u/s last year  Hopefully she will hear soon

## 2021-10-24 NOTE — Telephone Encounter (Signed)
Ok to contact pt -   Incidental large thyroid nodule seen on the carotid u/s which may or may not be different from that checked last in June 2022  We should order a follow up thyroid u/s to see if any actual change from the u/s last year  Hopefully she will hear soon 

## 2021-10-24 NOTE — Patient Instructions (Signed)
none

## 2021-10-25 ENCOUNTER — Ambulatory Visit
Admission: RE | Admit: 2021-10-25 | Discharge: 2021-10-25 | Disposition: A | Discharge status: Home / Self Care | Payer: Medicare Other | Source: Ambulatory Visit | Attending: Hematology and Oncology | Admitting: Hematology and Oncology

## 2021-10-25 DIAGNOSIS — Z1231 Encounter for screening mammogram for malignant neoplasm of breast: Secondary | ICD-10-CM

## 2021-10-25 NOTE — Telephone Encounter (Signed)
Patient notified and scheduled for 11/01/21

## 2021-10-26 ENCOUNTER — Other Ambulatory Visit: Payer: Self-pay | Admitting: Hematology and Oncology

## 2021-10-26 DIAGNOSIS — R928 Other abnormal and inconclusive findings on diagnostic imaging of breast: Secondary | ICD-10-CM

## 2021-11-01 ENCOUNTER — Ambulatory Visit
Admission: RE | Admit: 2021-11-01 | Discharge: 2021-11-01 | Disposition: A | Discharge status: Home / Self Care | Payer: Medicare Other | Source: Ambulatory Visit | Attending: Internal Medicine | Admitting: Internal Medicine

## 2021-11-01 DIAGNOSIS — E890 Postprocedural hypothyroidism: Secondary | ICD-10-CM | POA: Diagnosis not present

## 2021-11-01 DIAGNOSIS — E041 Nontoxic single thyroid nodule: Secondary | ICD-10-CM

## 2021-11-07 ENCOUNTER — Ambulatory Visit (INDEPENDENT_AMBULATORY_CARE_PROVIDER_SITE_OTHER): Payer: Medicare Other | Admitting: Podiatry

## 2021-11-07 ENCOUNTER — Ambulatory Visit
Admission: RE | Admit: 2021-11-07 | Discharge: 2021-11-07 | Disposition: A | Discharge status: Home / Self Care | Payer: Medicare Other | Source: Ambulatory Visit | Attending: Hematology and Oncology | Admitting: Hematology and Oncology

## 2021-11-07 ENCOUNTER — Encounter: Payer: Self-pay | Admitting: Podiatry

## 2021-11-07 DIAGNOSIS — B351 Tinea unguium: Secondary | ICD-10-CM | POA: Diagnosis not present

## 2021-11-07 DIAGNOSIS — M79674 Pain in right toe(s): Secondary | ICD-10-CM

## 2021-11-07 DIAGNOSIS — E1142 Type 2 diabetes mellitus with diabetic polyneuropathy: Secondary | ICD-10-CM | POA: Diagnosis not present

## 2021-11-07 DIAGNOSIS — M79675 Pain in left toe(s): Secondary | ICD-10-CM

## 2021-11-07 DIAGNOSIS — R928 Other abnormal and inconclusive findings on diagnostic imaging of breast: Secondary | ICD-10-CM

## 2021-11-07 DIAGNOSIS — R921 Mammographic calcification found on diagnostic imaging of breast: Secondary | ICD-10-CM | POA: Diagnosis not present

## 2021-11-07 NOTE — Progress Notes (Signed)
  Subjective:  Patient ID: Denise [name], female    DOB: 1946-04-12,   MRN: [id number]  No chief complaint on file.   76 y.o. female presents for concern of thickened elongated and painful nails that are difficult to trim. Requesting to have them trimmed today. Relates burning and tingling in their feet. Patient is diabetic and last A1c was  Lab Results  Component Value Date   HGBA1C 6.1 10/17/2021   .   PCP:  Biagio Borg, MD    . Denies any other pedal complaints. Denies n/v/f/c.   Past Medical History:  Diagnosis Date   Abdominal pain, left lower quadrant 09/12/2008   ALLERGIC RHINITIS 08/24/2007   Allergy    Anemia    past hx- not current    ANXIETY 08/24/2007   Aortic stenosis, severe 07/19/2016   ASTHMA 08/24/2007   ASTHMA, WITH ACUTE EXACERBATION 03/14/2008   Breast cancer ([place]) 2017   right- radiation only    Cataract    removed bilat    DDD (degenerative disc disease), lumbar    DEGENERATIVE JOINT DISEASE 08/24/2007   DEPRESSION 08/24/2007   DIABETES MELLITUS, TYPE II 08/24/2007   ECZEMA 08/24/2007   Edema 08/24/2007   GERD 08/24/2007   not current (07/2014)- not current 2019   Heart murmur    HYPERCHOLESTEROLEMIA 08/24/2007   HYPERLIPIDEMIA 08/24/2007   HYPERTENSION 08/24/2007   Lower extremity edema 07/13/2021   Neuromuscular disorder (HCC)    numbness in feet - still has feeling   OBESITY 08/24/2007   OSTEOARTHRITIS, KNEES, BILATERAL, 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 CFT <3 seconds. Absent hair growth on digits. Edema noted to bilateral lower extremities.  Xerosis noted bilaterally.  Skin. No lacerations or abrasions bilateral feet. Nails 1-5 bilateral  are thickened discolored and elongated with subungual debris.  Musculoskeletal: MMT 5/5 bilateral lower extremities in DF, PF, Inversion and Eversion. Deceased ROM in DF of ankle joint.  Neurological: Sensation intact to light touch. Protective sensation diminished bilateral.    Assessment:   1. Type 2 diabetes mellitus with diabetic polyneuropathy, without long-term current use of insulin (HCC)   2. Pain due to onychomycosis of toenails of both feet      Plan:  Patient was evaluated and treated and all questions answered. -Discussed and educated patient on diabetic foot care, especially with  regards to the vascular, neurological and musculoskeletal systems.  -Stressed the importance of good glycemic control and the detriment of not  controlling glucose levels in relation to the foot. -Discussed supportive shoes at all times and checking feet regularly.  -Mechanically debrided all nails 1-5 bilateral using sterile nail nipper and filed with dremel without incident  -Answered all patient questions -Patient to return  in 6 months for at risk foot care -Patient advised to call the office if any problems or questions arise in the meantime.   Lorenda Peck, DPM

## 2021-11-12 ENCOUNTER — Ambulatory Visit (INDEPENDENT_AMBULATORY_CARE_PROVIDER_SITE_OTHER): Payer: Medicare Other | Admitting: Psychologist

## 2021-11-12 DIAGNOSIS — F320 Major depressive disorder, single episode, mild: Secondary | ICD-10-CM

## 2021-11-12 DIAGNOSIS — Z634 Disappearance and death of family member: Secondary | ICD-10-CM | POA: Diagnosis not present
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
## 2021-11-12 NOTE — Progress Notes (Signed)
                Jashaun Penrose, PsyD 

## 2021-11-12 NOTE — Plan of Care (Signed)
Goals Begin a healthy grieving process Objectives Tell in detail the story of the current loss that is triggering symptoms Read books on the topic of grief Watch videos on the theme of grief Begin verbalizing feelings associated with the loss Attend a grief support group express thoughts and feelings about the deceased Identify and voice positives about the deceased implement acts of spiritual faith  Interventions create a safe environment and actively build trust use empathy, compassion, and support ask the patient to write a letter to the lost person conduct empty chair ask the patient to discuss and list the positives and negative aspects of the person encourage patient to rely upon his/her spiritual faith  ask client to read books on grief ask patient to watch videos about grief assist patient in identifying emotions  ask patient to attend support group   Goals Work through the grieving process and face reality of own death Accept emotional support from others around them Live life to the fullest, event though time may be limited Become as knowledgeable about the medical condition  Reduce fear, anxiety about the health condition  Accept the illness Accept the role of psychological and behavioral factors  Stabilize anxiety level wile increasing ability to function Learn and implement coping skills that result in a reduction of anxiety  Alleviate depressive symptoms Recognize, accept, and cope with depressive feelings Develop healthy thinking patterns Develop healthy interpersonal relationships  Objectives Identify feelings associated with the illness Family members share with each other feelings Identify the losses or limitations that have been experienced Verbalize acceptance of the reality of the medical condition Commit to learning and implement a proactive approach to managing personal stresses Verbalize an understanding of the medical condition Work with therapist  to develop a plan for coping with stress Learn and implement skills for managing stress Engage in social, productive activities that are possible Engage in faith based activities implement positive imagery Identify coping skills and sources of emotional support Patient's partner and family members verbalize their fears regarding severity of health condition Identify sources of emotional distress  Learning and implement calming skills to reduce overall anxiety Learn and implement problem solving strategies Identify and engage in pleasant activities Learning and implement personal and interpersonal skills to reduce anxiety and improve interpersonal relationships Learn to accept limitations in life and commit to tolerating, rather than avoiding, unpleasant emotions while accomplishing meaningful goals Identify major life conflicts from the past and present that form the basis for present anxiety Learn and implement behavioral strategies Verbalize an understanding and resolution of current interpersonal problems Learn and implement problem solving and decision making skills Learn and implement conflict resolution skills to resolve interpersonal problems Verbalize an understanding of healthy and unhealthy emotions verbalize insight into how past relationships may be influence current experiences with depression Use mindfulness and acceptance strategies and increase value based behavior  Increase hopeful statements about the future.   Interventions Teach about stress and ways to handle stress Assist the patient in developing a coping action plan for stressors Conduct skills based training for coping strategies Train problem focused skills Sort out what activities the individual can do Encourage patient to rely upon his/her spiritual faith Teach the patient to use guided imagery Probe and evaluate family's ability to provide emotional support Allow family to share their fears Assist the  patient in identifying, sorting through, and verbalizing the various feelings generated by his/her medical condition Meet with family members  Ask patient list out limitations  Use stress  inoculation training  Use Acceptance and Commitment Therapy to help client accept uncomfortable realities in order to accomplish value-consistent goals Reinforce the client's insight into the role of his/her past emotional pain and present anxiety  Discuss examples demonstrating that unrealistic worry overestimates the probability of threats and underestimate patient's ability  Assist the patient in analyzing his or her worries Help patient understand that avoidance is reinforcing  Behavioral activation help the client explore the relationship, nature of the dispute,  Help the client develop new interpersonal skills and relationships Conduct Problem so living therapy Teach conflict resolution skills Use a process-experiential approach Conduct TLDP Conduct ACT

## 2021-11-21 ENCOUNTER — Encounter: Payer: Self-pay | Admitting: Gastroenterology

## 2021-11-21 ENCOUNTER — Ambulatory Visit (INDEPENDENT_AMBULATORY_CARE_PROVIDER_SITE_OTHER): Payer: Medicare Other | Admitting: Gastroenterology

## 2021-11-21 VITALS — BP 130/80 | HR 82 | Ht 61.0 in | Wt 216.0 lb

## 2021-11-21 DIAGNOSIS — R09A2 Foreign body sensation, throat: Secondary | ICD-10-CM

## 2021-11-21 DIAGNOSIS — R1310 Dysphagia, unspecified: Secondary | ICD-10-CM | POA: Diagnosis not present

## 2021-11-21 DIAGNOSIS — R0989 Other specified symptoms and signs involving the circulatory and respiratory systems: Secondary | ICD-10-CM

## 2021-11-21 DIAGNOSIS — I6523 Occlusion and stenosis of bilateral carotid arteries: Secondary | ICD-10-CM | POA: Diagnosis not present

## 2021-11-21 MED ORDER — PANTOPRAZOLE SODIUM 40 MG PO TBEC: 40.0000 mg | DELAYED_RELEASE_TABLET | Freq: Every day | ORAL | 11 refills | Status: DC

## 2021-11-21 NOTE — Patient Instructions (Addendum)
We have sent the following medications to your pharmacy for you to pick up at your convenience: pantoprazole 40 mg daily.    You have been scheduled for a Barium Esophogram at [facility] (1st floor of the hospital) on 8//23 at 10:30am. Please arrive 15 minutes prior to your appointment for registration. Make certain not to have anything to eat or drink 3 hours prior to your test. If you need to reschedule for any reason, please contact radiology at [phone] to do so. ___________________________________________________________ A barium swallow is an examination that concentrates on views of the esophagus. This tends to be a double contrast exam (barium and two liquids which, when combined, create a gas to distend the wall of the oesophagus) or single contrast (non-ionic iodine based). The study is usually tailored to your symptoms so a good history is essential. Attention is paid during the study to the form, structure and configuration of the esophagus, looking for functional disorders (such as aspiration, dysphagia, achalasia, motility and reflux) EXAMINATION You may be asked to change into a gown, depending on the type of swallow being performed. A radiologist and radiographer will perform the procedure. The radiologist will advise you of the type of contrast selected for your procedure and direct you during the exam. You will be asked to stand, sit or lie in several different positions and to hold a small amount of fluid in your mouth before being asked to swallow while the imaging is performed .In some instances you may be asked to swallow barium coated marshmallows to assess the motility of a solid food bolus. The exam can be recorded as a digital or video fluoroscopy procedure. POST PROCEDURE It will take 1-2 days for the barium to pass through your system. To facilitate this, it is important, unless otherwise directed, to increase your fluids for the next 24-48hrs and to resume your  normal diet.  This test typically takes about 30 minutes to perform. ________________________________________________________________  Denise [name] have been scheduled for an endoscopy. Please follow written instructions given to you at your visit today. If you use inhalers (even only as needed), please bring them with you on the day of your procedure.  The [facility] providers would like to encourage you to use [facility] to communicate with providers for non-urgent requests or questions.  Due to long hold times on the telephone, sending your provider a message by [facility] may be a faster and more efficient way to get a response.  Please allow 48 business hours for a response.  Please remember that this is for non-urgent requests.   Due to recent changes in healthcare laws, you may see the results of your imaging and laboratory studies on MyChart before your provider has had a chance to review them.  We understand that in some cases there may be results that are confusing or concerning to you. Not all laboratory results come back in the same time frame and the provider may be waiting for multiple results in order to interpret others.  Please give [place] 48 hours in order for your provider to thoroughly review all the results before contacting the office for clarification of your results.   Thank you for choosing me and [facility].  Pricilla Riffle. Dagoberto Ligas., MD., [facility] Regal

## 2021-11-21 NOTE — Addendum Note (Signed)
Addended by: Dorisann Frames L on: 11/21/2021 02:26 PM   Modules accepted: Orders
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
## 2021-11-27 ENCOUNTER — Ambulatory Visit (INDEPENDENT_AMBULATORY_CARE_PROVIDER_SITE_OTHER): Payer: Medicare Other | Admitting: Psychologist

## 2021-11-27 ENCOUNTER — Ambulatory Visit: Payer: Medicare Other | Admitting: Psychologist

## 2021-11-27 DIAGNOSIS — Z634 Disappearance and death of family member: Secondary | ICD-10-CM | POA: Diagnosis not present

## 2021-11-27 DIAGNOSIS — F320 Major depressive disorder, single episode, mild: Secondary | ICD-10-CM

## 2021-11-27 NOTE — Progress Notes (Signed)
                Destin Vinsant, PsyD 

## 2021-11-27 NOTE — Progress Notes (Signed)
[facility] Counselor/Therapist Progress Note  Patient ID: Denise [name], MRN: [id number],    Date: 11/27/2021  Time Spent: 1:05 pm to 01:21 pm; total time: 16 minutes   This session was held via in person. The patient consented to in-person therapy and was in the clinician's office. Limits of confidentiality were discussed with the patient.   Treatment Type: Individual Therapy  Reported Symptoms: Denied any symptoms  Mental Status Exam: Appearance:  Well Groomed     Behavior: Appropriate  Motor: Normal  Speech/Language:  Clear and Coherent  Affect: Appropriate  Mood: normal  Thought process: normal  Thought content:   WNL  Sensory/Perceptual disturbances:   WNL  Orientation: oriented to person, place, and time/date  Attention: Good  Concentration: Good  Memory: WNL  Fund of knowledge:  Good  Insight:   Good  Judgment:  Good  Impulse Control: Good   Risk Assessment: Danger to Self:  No Self-injurious Behavior: No Danger to Others: No Duty to Warn:no Physical Aggression / Violence:No  Access to Firearms a concern: No  Gang Involvement:No   Subjective: Beginning the session, patient described herself as doing well. After reviewing the treatment plan, patient stated that she has worked through her grief as part of the family reunion she had a couple of weeks ago. Continuing to talk, she stated that her concerns about her health have been resolved. She denied suicidal and homicidal ideation.    Interventions:  Worked on developing a therapeutic relationship with the patient using active listening and reflective statements. Provided emotional support using empathy and validation. Reviewed the treatment plan with the patient. Reviewed events since the intake occurred. Praised the patient for doing better and explored what has helped. Used socratic questions to assist the patient gain insight into self. Processed potential barriers and how to overcome them.  Assessed for suicidal and homicidal ideation.   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   Plan:   Goals Alleviate depressive symptoms Recognize, accept, and cope with depressive feelings Develop healthy thinking patterns Develop healthy interpersonal relationships Begin a healthy grieving process  Objectives target date for all objectives is 11/13/2022 Cooperate with a medication evaluation by a physician Verbalize an accurate understanding of depression Verbalize an understanding of the treatment Identify and replace thoughts that support depression Learn and implement behavioral strategies Verbalize an understanding and resolution of current interpersonal problems Learn and implement problem solving and decision making skills Learn and implement conflict resolution skills to resolve interpersonal problems Verbalize an understanding of healthy and unhealthy emotions verbalize insight into how past relationships may be influence current experiences with depression Use mindfulness and acceptance strategies and increase value based behavior  Increase hopeful statements about the future.  Tell in detail the story of the current loss that is triggering symptoms Read books on the topic of grief Watch videos on the theme of grief Begin verbalizing feelings associated with the loss Attend a grief support group express thoughts and feelings about the deceased Identify and voice positives about the deceased implement acts of spiritual faith  Interventions Consistent with treatment model, discuss how change in cognitive, behavioral, and interpersonal can help client alleviate depression CBT Behavioral activation help the client explore the relationship, nature of the dispute,  Help the client develop new interpersonal skills and relationships Conduct Problem so living therapy Teach conflict resolution skills Use a  process-experiential approach Conduct TLDP Conduct ACT Evaluate need for psychotropic medication Monitor adherence to medication  create a safe environment and actively build trust use empathy, compassion, and support ask the patient to write a letter to the lost person conduct empty chair ask the patient to discuss and list the positives and negative aspects of the person encourage patient to rely upon his/her spiritual faith  ask client to read books on grief ask patient to watch videos about grief assist patient in identifying emotions  ask patient to attend support group   The patient and clinician reviewed the treatment plan on 11/27/2021. The patient approved of the treatment plan.   Conception Chancy, PsyD

## 2021-11-28 ENCOUNTER — Other Ambulatory Visit (HOSPITAL_COMMUNITY): Payer: Medicare Other

## 2021-11-28 DIAGNOSIS — S63653A Sprain of metacarpophalangeal joint of left middle finger, initial encounter: Secondary | ICD-10-CM | POA: Diagnosis not present

## 2021-11-28 DIAGNOSIS — M65341 Trigger finger, right ring finger: Secondary | ICD-10-CM | POA: Diagnosis not present

## 2021-11-28 DIAGNOSIS — M1812 Unilateral primary osteoarthritis of first carpometacarpal joint, left hand: Secondary | ICD-10-CM | POA: Diagnosis not present

## 2021-11-29 ENCOUNTER — Encounter: Payer: Self-pay | Admitting: Gastroenterology

## 2021-11-29 ENCOUNTER — Ambulatory Visit (HOSPITAL_COMMUNITY)
Admission: RE | Admit: 2021-11-29 | Discharge: 2021-11-29 | Disposition: A | Discharge status: Home / Self Care | Payer: Medicare Other | Source: Ambulatory Visit | Attending: Gastroenterology | Admitting: Gastroenterology

## 2021-11-29 DIAGNOSIS — R0989 Other specified symptoms and signs involving the circulatory and respiratory systems: Secondary | ICD-10-CM | POA: Insufficient documentation

## 2021-11-29 DIAGNOSIS — R1310 Dysphagia, unspecified: Secondary | ICD-10-CM | POA: Insufficient documentation

## 2021-11-29 DIAGNOSIS — K222 Esophageal obstruction: Secondary | ICD-10-CM | POA: Diagnosis not present

## 2021-11-29 DIAGNOSIS — K219 Gastro-esophageal reflux disease without esophagitis: Secondary | ICD-10-CM | POA: Diagnosis not present

## 2021-12-03 ENCOUNTER — Ambulatory Visit (AMBULATORY_SURGERY_CENTER): Payer: Medicare Other | Admitting: Gastroenterology

## 2021-12-03 ENCOUNTER — Encounter: Payer: Self-pay | Admitting: Gastroenterology

## 2021-12-03 VITALS — BP 187/86 | HR 66 | Temp 97.3°F | Resp 18 | Ht 61.0 in | Wt 216.0 lb

## 2021-12-03 DIAGNOSIS — I10 Essential (primary) hypertension: Secondary | ICD-10-CM | POA: Diagnosis not present

## 2021-12-03 DIAGNOSIS — G4733 Obstructive sleep apnea (adult) (pediatric): Secondary | ICD-10-CM | POA: Diagnosis not present

## 2021-12-03 DIAGNOSIS — R0989 Other specified symptoms and signs involving the circulatory and respiratory systems: Secondary | ICD-10-CM

## 2021-12-03 DIAGNOSIS — K2960 Other gastritis without bleeding: Secondary | ICD-10-CM

## 2021-12-03 DIAGNOSIS — R1310 Dysphagia, unspecified: Secondary | ICD-10-CM

## 2021-12-03 DIAGNOSIS — R933 Abnormal findings on diagnostic imaging of other parts of digestive tract: Secondary | ICD-10-CM

## 2021-12-03 DIAGNOSIS — E669 Obesity, unspecified: Secondary | ICD-10-CM | POA: Diagnosis not present

## 2021-12-03 DIAGNOSIS — K219 Gastro-esophageal reflux disease without esophagitis: Secondary | ICD-10-CM | POA: Diagnosis not present

## 2021-12-03 MED ORDER — SODIUM CHLORIDE 0.9 % IV SOLN: 500.0000 mL | Freq: Once | INTRAVENOUS | Status: DC

## 2021-12-03 NOTE — Progress Notes (Signed)
Report to PACU, RN, vss, BBS= Clear.  

## 2021-12-03 NOTE — Op Note (Addendum)
[facility] Patient Name: Denise [name] Procedure Date: 12/03/2021 1:59 PM MRN: [id number] Endoscopist: Ladene Artist , MD Age: 76 Referring MD:  Date of Birth: 03/07/1946 Gender: Female Account #: [id number] Procedure:                Upper GI endoscopy Indications:              Dysphagia, Abnormal esophagram, Gastroesophageal                            reflux disease Medicines:                Monitored Anesthesia Care Procedure:                Pre-Anesthesia Assessment:                           - Prior to the procedure, a History and Physical                            was performed, and patient medications and                            allergies were reviewed. The patient's tolerance of                            previous anesthesia was also reviewed. The risks                            and benefits of the procedure and the sedation                            options and risks were discussed with the patient.                            All questions were answered, and informed consent                            was obtained. Prior Anticoagulants: The patient has                            taken no previous anticoagulant or antiplatelet                            agents. ASA Grade Assessment: III - A patient with                            severe systemic disease. After reviewing the risks                            and benefits, the patient was deemed in                            satisfactory condition to undergo the procedure.  After obtaining informed consent, the endoscope was                            passed under direct vision. Throughout the                            procedure, the patient's blood pressure, pulse, and                            oxygen saturations were monitored continuously. The                            GIF [id number] #[id number] was introduced through the                            mouth, and advanced to the second  part of duodenum.                            The upper GI endoscopy was accomplished without                            difficulty. The patient tolerated the procedure                            well. Scope In: Scope Out: Findings:                 No endoscopic abnormality was evident in the                            esophagus to explain the patient's complaint of                            dysphagia. It was decided, however, to proceed with                            dilation of the entire esophagus. A guidewire was                            placed and the scope was withdrawn. Dilation was                            performed with a Savary dilator with no resistance                            at 17 mm.                           A few localized small erosions with no bleeding and                            no stigmata of recent bleeding were found in the  gastric antrum. Biopsies were taken with a cold                            forceps for histology.                           The exam of the stomach was otherwise normal.                           The duodenal bulb and second portion of the                            duodenum were normal. Complications:            No immediate complications. Estimated Blood Loss:     Estimated blood loss was minimal. Impression:               - No endoscopic esophageal abnormality to explain                            patient's dysphagia. Esophagus dilated.                           - Erosive gastropathy with no bleeding and no                            stigmata of recent bleeding. Biopsied.                           - Normal duodenal bulb and second portion of the                            duodenum. Recommendation:           - Patient has a contact number available for                            emergencies. The signs and symptoms of potential                            delayed complications were discussed with the                             patient. Return to normal activities tomorrow.                            Written discharge instructions were provided to the                            patient.                           - Clear liquid diet for 2 hours, then advance as                            tolerated to soft diet today.                           -  Resume prior diet tomorrow.                           - Follow antireflux measures.                           - Continue present medications.                           - Await pathology results. Ladene Artist, MD 12/03/2021 2:15:07 PM This report has been signed electronically.

## 2021-12-03 NOTE — Progress Notes (Signed)
See 11/21/2021 H&P, no changes

## 2021-12-03 NOTE — Progress Notes (Signed)
Pt's states no medical or surgical changes since previsit or office visit. VS assessed by AS 

## 2021-12-03 NOTE — Patient Instructions (Signed)
Please read handouts provided. Continue present medications. Await pathology results. Follow Dilation Diet. Resume prior diet tomorrow. Follow antireflux measures.   YOU HAD AN ENDOSCOPIC PROCEDURE TODAY AT [facility]:   Refer to the procedure report that was given to you for any specific questions about what was found during the examination.  If the procedure report does not answer your questions, please call your gastroenterologist to clarify.  If you requested that your care partner not be given the details of your procedure findings, then the procedure report has been included in a sealed envelope for you to review at your convenience later.  YOU SHOULD EXPECT: Some feelings of bloating in the abdomen. Passage of more gas than usual.  Walking can help get rid of the air that was put into your GI tract during the procedure and reduce the bloating. If you had a lower endoscopy (such as a colonoscopy or flexible sigmoidoscopy) you may notice spotting of blood in your stool or on the toilet paper. If you underwent a bowel prep for your procedure, you may not have a normal bowel movement for a few days.  Please Note:  You might notice some irritation and congestion in your nose or some drainage.  This is from the oxygen used during your procedure.  There is no need for concern and it should clear up in a day or so.  SYMPTOMS TO REPORT IMMEDIATELY:  Following upper endoscopy (EGD)  Vomiting of blood or coffee ground material  New chest pain or pain under the shoulder blades  Painful or persistently difficult swallowing  New shortness of breath  Fever of 100F or higher  Black, tarry-looking stools  For urgent or emergent issues, a gastroenterologist can be reached at any hour by calling [phone]. Do not use MyChart messaging for urgent concerns.    DIET:  We do recommend a small meal at first, but then you may proceed to your regular diet.  Drink plenty of fluids but  you should avoid alcoholic beverages for 24 hours.  ACTIVITY:  You should plan to take it easy for the rest of today and you should NOT DRIVE or use heavy machinery until tomorrow (because of the sedation medicines used during the test).    FOLLOW UP: Our staff will call the number listed on your records the next business day following your procedure.  We will call around 7:15- 8:00 am to check on you and address any questions or concerns that you may have regarding the information given to you following your procedure. If we do not reach you, we will leave a message.  If you develop any symptoms (ie: fever, flu-like symptoms, shortness of breath, cough etc.) before then, please call [phone].  If you test positive for Covid 19 in the 2 weeks post procedure, please call and report this information to [place].    If any biopsies were taken you will be contacted by phone or by letter within the next 1-3 weeks.  Please call us at [phone] if you have not heard about the biopsies in 3 weeks.    SIGNATURES/CONFIDENTIALITY: You and/or your care partner have signed paperwork which will be entered into your electronic medical record.  These signatures attest to the fact that that the information above on your After Visit Summary has been reviewed and is understood.  Full responsibility of the confidentiality of this discharge information lies with you and/or your care-partner.

## 2021-12-04 ENCOUNTER — Telehealth: Payer: Self-pay

## 2021-12-04 NOTE — Telephone Encounter (Signed)
  Follow up Call-     12/03/2021    1:18 PM  Call back number  Post procedure Call Back phone  # [phone]  Permission to leave phone message Yes     Patient questions:  Do you have a fever, pain , or abdominal swelling? No. Pain Score  0 *  Have you tolerated food without any problems? Yes.    Have you been able to return to your normal activities? Yes.    Do you have any questions about your discharge instructions: Diet   No. Medications  No. Follow up visit  No.  Do you have questions or concerns about your Care? No.  Actions: * If pain score is 4 or above: No action needed, pain <4.

## 2021-12-14 DIAGNOSIS — R070 Pain in throat: Secondary | ICD-10-CM | POA: Diagnosis not present

## 2021-12-14 DIAGNOSIS — R0989 Other specified symptoms and signs involving the circulatory and respiratory systems: Secondary | ICD-10-CM | POA: Diagnosis not present

## 2021-12-14 DIAGNOSIS — R1319 Other dysphagia: Secondary | ICD-10-CM | POA: Diagnosis not present

## 2021-12-14 DIAGNOSIS — R059 Cough, unspecified: Secondary | ICD-10-CM | POA: Diagnosis not present

## 2021-12-18 ENCOUNTER — Other Ambulatory Visit: Payer: Self-pay | Admitting: Internal Medicine

## 2021-12-18 NOTE — Telephone Encounter (Signed)
Please refill as per office routine med refill policy (all routine meds to be refilled for 3 mo or monthly (per pt preference) up to one year from last visit, then month to month grace period for 3 mo, then further med refills will have to be denied) ? ?

## 2021-12-24 ENCOUNTER — Encounter: Payer: Self-pay | Admitting: Gastroenterology

## 2021-12-26 ENCOUNTER — Encounter: Payer: Self-pay | Admitting: Gastroenterology

## 2022-01-07 ENCOUNTER — Encounter: Payer: Self-pay | Admitting: Internal Medicine

## 2022-01-09 ENCOUNTER — Other Ambulatory Visit: Payer: Self-pay | Admitting: Internal Medicine

## 2022-01-09 ENCOUNTER — Other Ambulatory Visit: Payer: Self-pay

## 2022-01-09 ENCOUNTER — Encounter (HOSPITAL_BASED_OUTPATIENT_CLINIC_OR_DEPARTMENT_OTHER): Payer: Self-pay | Admitting: Cardiovascular Disease

## 2022-01-09 MED ORDER — BUDESONIDE-FORMOTEROL FUMARATE 160-4.5 MCG/ACT IN AERO: 2.0000 | INHALATION_SPRAY | Freq: Two times a day (BID) | RESPIRATORY_TRACT | 3 refills | Status: DC

## 2022-01-17 ENCOUNTER — Ambulatory Visit (INDEPENDENT_AMBULATORY_CARE_PROVIDER_SITE_OTHER): Payer: Medicare Other | Admitting: Emergency Medicine

## 2022-01-17 ENCOUNTER — Encounter: Payer: Self-pay | Admitting: Emergency Medicine

## 2022-01-17 DIAGNOSIS — G4733 Obstructive sleep apnea (adult) (pediatric): Secondary | ICD-10-CM

## 2022-01-17 DIAGNOSIS — J4530 Mild persistent asthma, uncomplicated: Secondary | ICD-10-CM | POA: Diagnosis not present

## 2022-01-17 DIAGNOSIS — I6523 Occlusion and stenosis of bilateral carotid arteries: Secondary | ICD-10-CM | POA: Diagnosis not present

## 2022-01-17 DIAGNOSIS — R918 Other nonspecific abnormal finding of lung field: Secondary | ICD-10-CM

## 2022-01-17 DIAGNOSIS — J309 Allergic rhinitis, unspecified: Secondary | ICD-10-CM | POA: Diagnosis not present

## 2022-01-17 NOTE — Assessment & Plan Note (Signed)
Fluticasone nasal spray increased to daily from as needed.  Agree with this.

## 2022-01-17 NOTE — Assessment & Plan Note (Signed)
Very good compliance documented today.  She does have clinical benefit including more mental clarity, better energy, less lower extremity edema, better blood pressure control.  She had been working on getting an optimal mask fit, is using an AirFit fullface mask size large.  We can continue this.

## 2022-01-17 NOTE — Assessment & Plan Note (Signed)
She has been dealing with a URI but no wheezing on exam today and no clear evidence of bronchospasm or an acute exacerbation of her asthma.  I would hold off on prednisone for now as she is over a week into this illness.  If she develops signs or symptoms of a flare then we will treat.  For now continue Symbicort, albuterol as needed, Mucinex.

## 2022-01-17 NOTE — Assessment & Plan Note (Signed)
Left upper lobe groundglass pulmonary nodule resolved.  No dedicated follow-up CTs needed

## 2022-01-17 NOTE — Patient Instructions (Addendum)
Please continue your CPAP every night.  Congratulations on wearing reliably.  We will document this in your chart. Continue your Symbicort 2 puffs twice a day.  Rinse and gargle after using. Keep your albuterol available to use 2 puffs if needed for shortness of breath, chest tightness, wheezing. Agree with using fluticasone nasal spray 2 sprays each nostril once daily. Use guaifenesin (Mucinex) if you need it for mucus clearance Annual flu shot You would benefit from getting the COVID-19 vaccine this fall Follow with Dr. Lamonte Sakai in 12 months or sooner if you have any problems.

## 2022-01-17 NOTE — Progress Notes (Signed)
Subjective:    Patient ID: Denise [name], female    DOB: 1945/06/02, 76 y.o.   MRN: [id number]  HPI  ROV 10/12/20 --76 year old woman with a minimal tobacco history, history of breast cancer.  Also diabetes, hyperlipidemia, eczema, hypertension, aortic stenosis with TAVR, asthma on Symbicort.  She had a CT chest performed in December that showed some subtle peribronchovascular left upper lobe groundglass without an overt nodule and some stable left upper lobe linear atelectasis. We repeated her CT chest to follow GGI nodule.  She is having some exertional SOB, no wheeze or cough. Rarely needs albuterol, may need it w URI's. She had a URI since last time, had to be treated for an acute flare. She averages 2x a year.   CT chest 10/09/2020 reviewed by me, shows no mediastinal or hilar adenopathy.  The thyroid is asymmetric with right lobe enlargement and a masslike appearance, partially calcified 4.2 x 3.2 cm.  There are no suspicious appearing nodules or masses in the lungs.  ROV 01/17/2022 --Denise [name] is 76 with a minimal tobacco history, history of breast cancer, diabetes, eczema, hypertension, AAS with TAVR.  We have followed her for asthma managed on Symbicort, resolved left upper lobe groundglass nodule.  Diagnosed with mild obstructive sleep apnea earlier this year.  She was started on AutoSet CPAP 5-15 cmH2O in June.   She has been working to improve her mask fit - changed to air fit full face mask. She changed to the large and it is working better. Kermit Balo compliance > 87% for > 4h/night. She has noticed more clarity, better energy, less LE edema. Better BP control as well.  She works at night, goes to sleep in the am.  Symbicort bid, has a URI right now. Albuterol about 2x a day lately.  Flonase to daily recently, added guaifenesin   Review of Systems As per HPI      Objective:   Physical Exam Vitals:   01/17/22 0900  BP: 136/74  Pulse: 88  Temp: 98 F (36.7 C)   TempSrc: Oral  SpO2: 98%  Weight: 219 lb (99.3 kg)  Height: '5\' 1"'$  (1.549 m)   Gen: Pleasant, overweight woman, in no distress,  normal affect  ENT: No lesions,  mouth clear,  oropharynx clear, no postnasal drip  Neck: No JVD, no stridor  Lungs: No use of accessory muscles, no crackles or wheezing on normal respiration, no wheeze on forced expiration  Cardiovascular: RRR, soft early systolic murmur with intact S2  Musculoskeletal: No deformities, no cyanosis or clubbing  Neuro: alert, awake, non focal  Skin: Warm, no lesions or rash       Assessment & Plan:  OSA (obstructive sleep apnea) Very good compliance documented today.  She does have clinical benefit including more mental clarity, better energy, less lower extremity edema, better blood pressure control.  She had been working on getting an optimal mask fit, is using an AirFit fullface mask size large.  We can continue this.  Asthma She has been dealing with a URI but no wheezing on exam today and no clear evidence of bronchospasm or an acute exacerbation of her asthma.  I would hold off on prednisone for now as she is over a week into this illness.  If she develops signs or symptoms of a flare then we will treat.  For now continue Symbicort, albuterol as needed, Mucinex.  Allergic rhinitis Fluticasone nasal spray increased to daily from as needed.  Agree with this.  Abnormal CT scan of lung Left upper lobe groundglass pulmonary nodule resolved.  No dedicated follow-up CTs needed  Baltazar Apo, MD, PhD 01/17/2022, 9:21 AM [facility] [phone] or if no answer [phone]

## 2022-01-18 ENCOUNTER — Ambulatory Visit (INDEPENDENT_AMBULATORY_CARE_PROVIDER_SITE_OTHER): Payer: Medicare Other | Admitting: Internal Medicine

## 2022-01-18 VITALS — BP 142/84 | HR 76 | Temp 97.8°F | Ht 61.0 in | Wt 215.0 lb

## 2022-01-18 DIAGNOSIS — Z23 Encounter for immunization: Secondary | ICD-10-CM

## 2022-01-18 DIAGNOSIS — I6523 Occlusion and stenosis of bilateral carotid arteries: Secondary | ICD-10-CM | POA: Diagnosis not present

## 2022-01-18 DIAGNOSIS — R1030 Lower abdominal pain, unspecified: Secondary | ICD-10-CM

## 2022-01-18 DIAGNOSIS — R1032 Left lower quadrant pain: Secondary | ICD-10-CM | POA: Diagnosis not present

## 2022-01-18 DIAGNOSIS — I10 Essential (primary) hypertension: Secondary | ICD-10-CM | POA: Diagnosis not present

## 2022-01-18 DIAGNOSIS — E119 Type 2 diabetes mellitus without complications: Secondary | ICD-10-CM | POA: Diagnosis not present

## 2022-01-18 LAB — URINALYSIS, ROUTINE W REFLEX MICROSCOPIC
Bilirubin Urine: NEGATIVE
Hgb urine dipstick: NEGATIVE
Leukocytes,Ua: NEGATIVE
Nitrite: NEGATIVE
RBC / HPF: NONE SEEN (ref 0–?)
Specific Gravity, Urine: 1.015 (ref 1.000–1.030)
Total Protein, Urine: NEGATIVE
Urine Glucose: NEGATIVE
Urobilinogen, UA: 1 (ref 0.0–1.0)
WBC, UA: NONE SEEN (ref 0–?)
pH: 6.5 (ref 5.0–8.0)

## 2022-01-18 LAB — HEPATIC FUNCTION PANEL
ALT: 19 U/L (ref 0–35)
AST: 23 U/L (ref 0–37)
Albumin: 4.3 g/dL (ref 3.5–5.2)
Alkaline Phosphatase: 69 U/L (ref 39–117)
Bilirubin, Direct: 0.1 mg/dL (ref 0.0–0.3)
Total Bilirubin: 0.6 mg/dL (ref 0.2–1.2)
Total Protein: 7.9 g/dL (ref 6.0–8.3)

## 2022-01-18 LAB — CBC WITH DIFFERENTIAL/PLATELET
Basophils Absolute: 0.1 10*3/uL (ref 0.0–0.1)
Basophils Relative: 0.8 % (ref 0.0–3.0)
Eosinophils Absolute: 0.4 10*3/uL (ref 0.0–0.7)
Eosinophils Relative: 4 % (ref 0.0–5.0)
HCT: 39.2 % (ref 36.0–46.0)
Hemoglobin: 12.7 g/dL (ref 12.0–15.0)
Lymphocytes Relative: 32.7 % (ref 12.0–46.0)
Lymphs Abs: 3.6 10*3/uL (ref 0.7–4.0)
MCHC: 32.4 g/dL (ref 30.0–36.0)
MCV: 80.8 fl (ref 78.0–100.0)
Monocytes Absolute: 0.7 10*3/uL (ref 0.1–1.0)
Monocytes Relative: 6.8 % (ref 3.0–12.0)
Neutro Abs: 6.1 10*3/uL (ref 1.4–7.7)
Neutrophils Relative %: 55.7 % (ref 43.0–77.0)
Platelets: 194 10*3/uL (ref 150.0–400.0)
RBC: 4.85 Mil/uL (ref 3.87–5.11)
RDW: 16.1 % — ABNORMAL HIGH (ref 11.5–15.5)
WBC: 10.9 10*3/uL — ABNORMAL HIGH (ref 4.0–10.5)

## 2022-01-18 LAB — LIPASE: Lipase: 54 U/L (ref 11.0–59.0)

## 2022-01-18 MED ORDER — CEFTRIAXONE SODIUM 1 G IJ SOLR
1.0000 g | Freq: Once | INTRAMUSCULAR | Status: AC
  Administered 2022-01-18: 1 g via INTRAMUSCULAR

## 2022-01-18 MED ORDER — METRONIDAZOLE 500 MG PO TABS: 500.0000 mg | ORAL_TABLET | Freq: Three times a day (TID) | ORAL | 0 refills | Status: AC

## 2022-01-18 MED ORDER — CIPROFLOXACIN HCL 500 MG PO TABS: 500.0000 mg | ORAL_TABLET | Freq: Two times a day (BID) | ORAL | 0 refills | Status: AC

## 2022-01-18 NOTE — Patient Instructions (Signed)
You had the flu shot today  You had the antibiotic shot today (rocephin)  Please take all new medication as prescribed - the cipro and flagyl  Please continue all other medications as before, and refills have been done if requested.  Please have the pharmacy call with any other refills you may need.  Please keep your appointments with your specialists as you may have planned  You will be contacted regarding the referral for: CT scan  Please go to the LAB at the blood drawing area for the tests to be done  You will be contacted by phone if any changes need to be made immediately.  Otherwise, you will receive a letter about your results with an explanation, but please check with MyChart first.  Please remember to sign up for MyChart if you have not done so, as this will be important to you in the future with finding out test results, communicating by private email, and scheduling acute appointments online when needed.
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
## 2022-01-19 LAB — URINE CULTURE: Result:: NO GROWTH

## 2022-01-20 DIAGNOSIS — R1032 Left lower quadrant pain: Secondary | ICD-10-CM | POA: Insufficient documentation

## 2022-01-20 NOTE — Assessment & Plan Note (Signed)
Lab Results  Component Value Date   HGBA1C 6.1 10/17/2021   Stable, pt to continue current medical treatment metfomrin 500 mg 2 bid

## 2022-01-20 NOTE — Assessment & Plan Note (Addendum)
Acute onset,  No prior hx of diverticulitis, last colonoscopy 2019 with observed sigmoid diverticulosis, now with typical and high suspicion for first episode acute diverticulitis, pt non toxic - now for rocephin 1 gm IM now, empiric oral cipro/flagyl, pain control, stat CT abd.pelvis w cm, and labs including cbc

## 2022-01-20 NOTE — Assessment & Plan Note (Signed)
BP Readings from Last 3 Encounters:  01/18/22 (!) 142/84  01/17/22 136/74  12/03/21 (!) 187/86   Mild increased likely reactive, pt to continue medical treatment norvasc 5 mg, diovan 40 mg

## 2022-01-21 ENCOUNTER — Ambulatory Visit
Admission: RE | Admit: 2022-01-21 | Discharge: 2022-01-21 | Disposition: A | Discharge status: Home / Self Care | Payer: Medicare Other | Source: Ambulatory Visit | Attending: Internal Medicine | Admitting: Internal Medicine

## 2022-01-21 ENCOUNTER — Encounter: Payer: Self-pay | Admitting: Emergency Medicine

## 2022-01-21 ENCOUNTER — Encounter (HOSPITAL_BASED_OUTPATIENT_CLINIC_OR_DEPARTMENT_OTHER): Payer: Self-pay | Admitting: Cardiovascular Disease

## 2022-01-21 DIAGNOSIS — R1030 Lower abdominal pain, unspecified: Secondary | ICD-10-CM

## 2022-01-21 DIAGNOSIS — I700 Atherosclerosis of aorta: Secondary | ICD-10-CM | POA: Diagnosis not present

## 2022-01-21 DIAGNOSIS — N281 Cyst of kidney, acquired: Secondary | ICD-10-CM | POA: Diagnosis not present

## 2022-01-21 DIAGNOSIS — M1612 Unilateral primary osteoarthritis, left hip: Secondary | ICD-10-CM | POA: Diagnosis not present

## 2022-01-21 DIAGNOSIS — K429 Umbilical hernia without obstruction or gangrene: Secondary | ICD-10-CM | POA: Diagnosis not present

## 2022-01-21 MED ORDER — IOPAMIDOL (ISOVUE-300) INJECTION 61%
100.0000 mL | Freq: Once | INTRAVENOUS | Status: AC | PRN
  Administered 2022-01-21: 100 mL via INTRAVENOUS

## 2022-01-22 ENCOUNTER — Ambulatory Visit (HOSPITAL_BASED_OUTPATIENT_CLINIC_OR_DEPARTMENT_OTHER): Payer: Medicare Other | Admitting: Cardiovascular Disease

## 2022-01-22 DIAGNOSIS — Z6841 Body Mass Index (BMI) 40.0 and over, adult: Secondary | ICD-10-CM | POA: Diagnosis not present

## 2022-01-22 DIAGNOSIS — M5440 Lumbago with sciatica, unspecified side: Secondary | ICD-10-CM | POA: Diagnosis not present

## 2022-01-28 ENCOUNTER — Encounter: Payer: Self-pay | Admitting: Internal Medicine

## 2022-01-30 MED ORDER — FLUCONAZOLE 150 MG PO TABS: ORAL_TABLET | ORAL | 1 refills | Status: DC

## 2022-02-01 ENCOUNTER — Ambulatory Visit (INDEPENDENT_AMBULATORY_CARE_PROVIDER_SITE_OTHER): Payer: Medicare Other

## 2022-02-01 VITALS — Ht 61.0 in | Wt 220.0 lb

## 2022-02-01 DIAGNOSIS — Z0000 Encounter for general adult medical examination without abnormal findings: Secondary | ICD-10-CM | POA: Diagnosis not present
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
## 2022-02-01 NOTE — Patient Instructions (Signed)
Denise [name] , Thank you for taking time to come for your Medicare Wellness Visit. I appreciate your ongoing commitment to your health goals. Please review the following plan we discussed and let me know if I can assist you in the future.   These are the goals we discussed:  Goals      Blood Pressure < 140/80     Exercise 3x per week (30 min per time)        This is a list of the screening recommended for you and due dates:  Health Maintenance  Topic Date Due   Zoster (Shingles) Vaccine (1 of 2) Never done   Eye exam for diabetics  05/08/2021   COVID-19 Vaccine (6 - Pfizer risk series) 09/05/2021   Yearly kidney health urinalysis for diabetes  10/12/2021   Complete foot exam   10/12/2021   Hemoglobin A1C  04/18/2022   Yearly kidney function blood test for diabetes  10/18/2022   Tetanus Vaccine  10/19/2028   Pneumonia Vaccine  Completed   Flu Shot  Completed   DEXA scan (bone density measurement)  Completed   Hepatitis C Screening: USPSTF Recommendation to screen - Ages 76-79 yo.  Completed   HPV Vaccine  Aged Out    Advanced directives: Please bring a copy of your health care power of attorney and living will to the office to be added to your chart at your convenience.   Conditions/risks identified: Aim for 30 minutes of exercise or brisk walking, 6-8 glasses of water, and 5 servings of fruits and vegetables each day.   Next appointment: Follow up in one year for your annual wellness visit    Preventive Care 76 Years and Older, Female Preventive care refers to lifestyle choices and visits with your health care provider that can promote health and wellness. What does preventive care include? A yearly physical exam. This is also called an annual well check. Dental exams once or twice a year. Routine eye exams. Ask your health care provider how often you should have your eyes checked. Personal lifestyle choices, including: Daily care of your teeth and gums. Regular physical  activity. Eating a healthy diet. Avoiding tobacco and drug use. Limiting alcohol use. Practicing safe sex. Taking low-dose aspirin every day. Taking vitamin and mineral supplements as recommended by your health care provider. What happens during an annual well check? The services and screenings done by your health care provider during your annual well check will depend on your age, overall health, lifestyle risk factors, and family history of disease. Counseling  Your health care provider may ask you questions about your: Alcohol use. Tobacco use. Drug use. Emotional well-being. Home and relationship well-being. Sexual activity. Eating habits. History of falls. Memory and ability to understand (cognition). Work and work Statistician. Reproductive health. Screening  You may have the following tests or measurements: Height, weight, and BMI. Blood pressure. Lipid and cholesterol levels. These may be checked every 5 years, or more frequently if you are over 51 years old. Skin check. Lung cancer screening. You may have this screening every year starting at age 65 if you have a 30-pack-year history of smoking and currently smoke or have quit within the past 15 years. Fecal occult blood test (FOBT) of the stool. You may have this test every year starting at age 85. Flexible sigmoidoscopy or colonoscopy. You may have a sigmoidoscopy every 5 years or a colonoscopy every 10 years starting at age 50. Hepatitis C blood test. Hepatitis B blood  test. Sexually transmitted disease (STD) testing. Diabetes screening. This is done by checking your blood sugar (glucose) after you have not eaten for a while (fasting). You may have this done every 1-3 years. Bone density scan. This is done to screen for osteoporosis. You may have this done starting at age 40. Mammogram. This may be done every 1-2 years. Talk to your health care provider about how often you should have regular mammograms. Talk with your  health care provider about your test results, treatment options, and if necessary, the need for more tests. Vaccines  Your health care provider may recommend certain vaccines, such as: Influenza vaccine. This is recommended every year. Tetanus, diphtheria, and acellular pertussis (Tdap, Td) vaccine. You may need a Td booster every 10 years. Zoster vaccine. You may need this after age 49. Pneumococcal 13-valent conjugate (PCV13) vaccine. One dose is recommended after age 15. Pneumococcal polysaccharide (PPSV23) vaccine. One dose is recommended after age 58. Talk to your health care provider about which screenings and vaccines you need and how often you need them. This information is not intended to replace advice given to you by your health care provider. Make sure you discuss any questions you have with your health care provider. Document Released: 05/12/2015 Document Revised: 01/03/2016 Document Reviewed: 02/14/2015 Elsevier Interactive Patient Education  2017 [place] Prevention in the Home Falls can cause injuries. They can happen to people of all ages. There are many things you can do to make your home safe and to help prevent falls. What can I do on the outside of my home? Regularly fix the edges of walkways and driveways and fix any cracks. Remove anything that might make you trip as you walk through a door, such as a raised step or threshold. Trim any bushes or trees on the path to your home. Use bright outdoor lighting. Clear any walking paths of anything that might make someone trip, such as rocks or tools. Regularly check to see if handrails are loose or broken. Make sure that both sides of any steps have handrails. Any raised decks and porches should have guardrails on the edges. Have any leaves, snow, or ice cleared regularly. Use sand or salt on walking paths during winter. Clean up any spills in your garage right away. This includes oil or grease spills. What can I  do in the bathroom? Use night lights. Install grab bars by the toilet and in the tub and shower. Do not use towel bars as grab bars. Use non-skid mats or decals in the tub or shower. If you need to sit down in the shower, use a plastic, non-slip stool. Keep the floor dry. Clean up any water that spills on the floor as soon as it happens. Remove soap buildup in the tub or shower regularly. Attach bath mats securely with double-sided non-slip rug tape. Do not have throw rugs and other things on the floor that can make you trip. What can I do in the bedroom? Use night lights. Make sure that you have a light by your bed that is easy to reach. Do not use any sheets or blankets that are too big for your bed. They should not hang down onto the floor. Have a firm chair that has side arms. You can use this for support while you get dressed. Do not have throw rugs and other things on the floor that can make you trip. What can I do in the kitchen? Clean up any spills right  away. Avoid walking on wet floors. Keep items that you use a lot in easy-to-reach places. If you need to reach something above you, use a strong step stool that has a grab bar. Keep electrical cords out of the way. Do not use floor polish or wax that makes floors slippery. If you must use wax, use non-skid floor wax. Do not have throw rugs and other things on the floor that can make you trip. What can I do with my stairs? Do not leave any items on the stairs. Make sure that there are handrails on both sides of the stairs and use them. Fix handrails that are broken or loose. Make sure that handrails are as long as the stairways. Check any carpeting to make sure that it is firmly attached to the stairs. Fix any carpet that is loose or worn. Avoid having throw rugs at the top or bottom of the stairs. If you do have throw rugs, attach them to the floor with carpet tape. Make sure that you have a light switch at the top of the stairs  and the bottom of the stairs. If you do not have them, ask someone to add them for you. What else can I do to help prevent falls? Wear shoes that: Do not have high heels. Have rubber bottoms. Are comfortable and fit you well. Are closed at the toe. Do not wear sandals. If you use a stepladder: Make sure that it is fully opened. Do not climb a closed stepladder. Make sure that both sides of the stepladder are locked into place. Ask someone to hold it for you, if possible. Clearly mark and make sure that you can see: Any grab bars or handrails. First and last steps. Where the edge of each step is. Use tools that help you move around (mobility aids) if they are needed. These include: Canes. Walkers. Scooters. Crutches. Turn on the lights when you go into a dark area. Replace any light bulbs as soon as they burn out. Set up your furniture so you have a clear path. Avoid moving your furniture around. If any of your floors are uneven, fix them. If there are any pets around you, be aware of where they are. Review your medicines with your doctor. Some medicines can make you feel dizzy. This can increase your chance of falling. Ask your doctor what other things that you can do to help prevent falls. This information is not intended to replace advice given to you by your health care provider. Make sure you discuss any questions you have with your health care provider. Document Released: 02/09/2009 Document Revised: 09/21/2015 Document Reviewed: 05/20/2014 Elsevier Interactive Patient Education  2017 [employer].

## 2022-02-05 ENCOUNTER — Ambulatory Visit (INDEPENDENT_AMBULATORY_CARE_PROVIDER_SITE_OTHER): Payer: Medicare Other | Admitting: Cardiovascular Disease

## 2022-02-05 ENCOUNTER — Encounter (HOSPITAL_BASED_OUTPATIENT_CLINIC_OR_DEPARTMENT_OTHER): Payer: Self-pay | Admitting: Cardiovascular Disease

## 2022-02-05 DIAGNOSIS — I6523 Occlusion and stenosis of bilateral carotid arteries: Secondary | ICD-10-CM

## 2022-02-05 DIAGNOSIS — I2584 Coronary atherosclerosis due to calcified coronary lesion: Secondary | ICD-10-CM | POA: Diagnosis not present

## 2022-02-05 DIAGNOSIS — G4733 Obstructive sleep apnea (adult) (pediatric): Secondary | ICD-10-CM

## 2022-02-05 DIAGNOSIS — I10 Essential (primary) hypertension: Secondary | ICD-10-CM

## 2022-02-05 DIAGNOSIS — I2510 Atherosclerotic heart disease of native coronary artery without angina pectoris: Secondary | ICD-10-CM | POA: Diagnosis not present

## 2022-02-05 DIAGNOSIS — I350 Nonrheumatic aortic (valve) stenosis: Secondary | ICD-10-CM | POA: Diagnosis not present

## 2022-02-05 MED ORDER — VALSARTAN 40 MG PO TABS: ORAL_TABLET | ORAL | 3 refills | Status: DC

## 2022-02-05 NOTE — Patient Instructions (Signed)
Medication Instructions:  INCREASE YOUR VALSARTAN TO 80 MG IN THE MORNING AND 40 MG IN THE EVENING   *If you need a refill on your cardiac medications before your next appointment, please call your pharmacy*  Lab Work: BMET IN 1 WEEK   If you have labs (blood work) drawn today and your tests are completely normal, you will receive your results only by: [name] (if you have MyChart) OR A paper copy in the mail If you have any lab test that is abnormal or we need to change your treatment, we will call you to review the results.  Testing/Procedures: NONE  Follow-Up: At [facility], you and your health needs are our priority.  As part of our continuing mission to provide you with exceptional heart care, we have created designated Provider Care Teams.  These Care Teams include your primary Cardiologist (physician) and Advanced Practice Providers (APPs -  Physician Assistants and Nurse Practitioners) who all work together to provide you with the care you need, when you need it.  We recommend signing up for the patient portal called "MyChart".  Sign up information is provided on this After Visit Summary.  MyChart is used to connect with patients for Virtual Visits (Telemedicine).  Patients are able to view lab/test results, encounter notes, upcoming appointments, etc.  Non-urgent messages can be sent to your provider as well.   To learn more about what you can do with MyChart, go to [email or web address].    Your next appointment:   6 month(s)  The format for your next appointment:   In Person  Provider:   Skeet Latch, MD

## 2022-02-05 NOTE — Assessment & Plan Note (Addendum)
BP 130s/70s at home and at her doctor.  Her goal is <130/80.  Encouraged her to increase her exercise.  Increase valsartan to 80 mg in the morning and continue 40 mg in the evening.  Check a BMP in a week.  Continue amlodipine.

## 2022-02-05 NOTE — Assessment & Plan Note (Signed)
Non-obstructive CAD.  Continue aspirin and atorvastatin.  LDL is slightly above goal.  Recommend increasing her exercise to at least 150 minutes weekly.
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
## 2022-02-05 NOTE — Assessment & Plan Note (Signed)
Status post TAVR.  Aortic valve gradient stable at 20 mmHg.  Repeat echo 07/2023.  She is euvolemic on exam.

## 2022-02-05 NOTE — Assessment & Plan Note (Signed)
She is now tolerating CPAP.

## 2022-02-09 ENCOUNTER — Other Ambulatory Visit: Payer: Self-pay | Admitting: Internal Medicine

## 2022-02-19 ENCOUNTER — Ambulatory Visit (INDEPENDENT_AMBULATORY_CARE_PROVIDER_SITE_OTHER): Payer: Medicare Other | Admitting: Gastroenterology

## 2022-02-19 ENCOUNTER — Encounter: Payer: Self-pay | Admitting: Gastroenterology

## 2022-02-19 VITALS — BP 150/80 | HR 88 | Ht 60.5 in | Wt 216.2 lb

## 2022-02-19 DIAGNOSIS — I6523 Occlusion and stenosis of bilateral carotid arteries: Secondary | ICD-10-CM | POA: Diagnosis not present

## 2022-02-19 DIAGNOSIS — K219 Gastro-esophageal reflux disease without esophagitis: Secondary | ICD-10-CM

## 2022-02-19 DIAGNOSIS — K5732 Diverticulitis of large intestine without perforation or abscess without bleeding: Secondary | ICD-10-CM | POA: Diagnosis not present

## 2022-02-19 MED ORDER — PANTOPRAZOLE SODIUM 40 MG PO TBEC: 40.0000 mg | DELAYED_RELEASE_TABLET | Freq: Two times a day (BID) | ORAL | 3 refills | Status: DC

## 2022-02-19 MED ORDER — FAMOTIDINE 40 MG PO TABS: 40.0000 mg | ORAL_TABLET | Freq: Every day | ORAL | 3 refills | Status: DC

## 2022-02-19 NOTE — Progress Notes (Signed)
Assessment     GERD with LPR active symptoms, dysphagia resolved Suspected acute diverticulitis, resolved    Recommendations    Increase pantoprazole 40 mg bid, famotidine 40 mg po hs and follow antireflux measures Long-term high-fiber diet with adequate daily water intake REV in 3 months   HPI    This is a 76 year old female returning for follow-up of GERD with LPR.  Her dysphagia resolved after EGD with dilation.  She complains of a sore throat sinus drainage and coughing up phlegm particularly in the mornings.  Since increasing her Flonase to daily she notes less difficulties with phlegm and cough.  She notes frequent nausea.  She was evaluated by ENT.  Was recently diagnosed with diverticulitis by her PCP.  CTAP did not show diverticulitis however her symptoms were consistent with diverticulitis and she responded to antibiotics.  She was treated with 1 g of Rocephin IM and then Cipro and Flagyl twice daily.  Her symptoms have completely resolved.  EGD Aug 2023 - No endoscopic esophageal abnormality to explain patient's dysphagia. Esophagus dilated. - Erosive gastropathy with no bleeding and no stigmata of recent bleeding. Biopsied. - Normal duodenal bulb and second portion of the duodenum.  Colonoscopy Oct 2019 - Three 5 to 7 mm polyps in the rectum and in the sigmoid colon, removed with a cold snare. Resected and retrieved. (not precancerous) - Diverticulosis in the left colon. - Internal hemorrhoids. - The examination was otherwise normal on direct and retroflexion views.  Labs / Imaging       Latest Ref Rng & Units 01/18/2022    3:29 PM 04/17/2021    8:39 AM 10/12/2020    8:53 AM  Hepatic Function  Total Protein 6.0 - 8.3 g/dL 7.9  7.7  7.7   Albumin 3.5 - 5.2 g/dL 4.3  4.3  4.6   AST 0 - 37 U/L 23  27  26    ALT 0 - 35 U/L 19  22  25    Alk Phosphatase 39 - 117 U/L 69  56  51   Total Bilirubin 0.2 - 1.2 mg/dL 0.6  0.7  0.8   Bilirubin, Direct 0.0 - 0.3 mg/dL 0.1   0.2  0.1        Latest Ref Rng & Units 01/18/2022    3:29 PM 10/09/2021   10:39 AM 01/03/2021   10:15 AM  CBC  WBC 4.0 - 10.5 K/uL 10.9  9.2  8.8   Hemoglobin 12.0 - 15.0 g/dL 12.7  13.5  12.9   Hematocrit 36.0 - 46.0 % 39.2  41.3  39.7   Platelets 150.0 - 400.0 K/uL 194.0  221  216    Barium esophagram  11/29/2021 Trace gastroesophageal reflux. Mild narrowing of the esophagus at the GE junction. Barium tablet passes.   CT Abdomen Pelvis W Contrast  Result Date: 01/21/2022 CLINICAL DATA:  Pain left lower quadrant of abdomen EXAM: CT ABDOMEN AND PELVIS WITH CONTRAST TECHNIQUE: Multidetector CT imaging of the abdomen and pelvis was performed using the standard protocol following bolus administration of intravenous contrast. RADIATION DOSE REDUCTION: This exam was performed according to the departmental dose-optimization program which includes automated exposure control, adjustment of the mA and/or kV according to patient size and/or use of iterative reconstruction technique. CONTRAST:  156m ISOVUE-300 IOPAMIDOL (ISOVUE-300) INJECTION 61% COMPARISON:  08/29/2016 FINDINGS: Lower chest: There is previous placement of prosthetic aortic valve. Dense calcifications are seen in mitral annulus. Scattered coronary artery calcifications are seen. Small  linear densities in the lower lung fields may suggest scarring or subsegmental atelectasis. Hepatobiliary: No focal abnormalities are seen in liver. There is no dilation of bile ducts. Gallbladder is not distended. Pancreas: Beam hardening artifacts limit evaluation. As far as seen, no focal abnormalities are noted in pancreas. Spleen: Unremarkable. Adrenals/Urinary Tract: Adrenals are unremarkable. There is no hydronephrosis. There are no renal or ureteral stones. There is 4.6 cm low-density structure in upper pole of left kidney consistent with renal cyst. There is possible subcentimeter cyst in the lower pole of left kidney. Urinary bladder is not distended.  Stomach/Bowel: Stomach is not distended. Small bowel loops are not dilated. Appendix is not dilated. There is no significant wall thickening in colon. Scattered diverticula are seen in colon without signs of focal acute diverticulitis. Vascular/Lymphatic: There are scattered calcifications in aorta and its major branches. Reproductive: Fundus of the uterus appears prominent. There are no adnexal masses. Other: There is no ascites or pneumoperitoneum. Small umbilical hernia containing fat is seen. There is skin thickening and subcutaneous stranding in right breast. This may be residual change from previous surgery and possibly radiation. Musculoskeletal: Laminectomy and surgical fusion is noted from L1-S1 levels. Degenerative changes are noted in left hip. IMPRESSION: There is no evidence of intestinal obstruction or pneumoperitoneum. There is no hydronephrosis. Scattered diverticula are seen in colon without signs of focal acute diverticulitis. Few left renal cysts. Coronary artery calcifications are seen. Fundus of the uterus appears more prominent with globular shape. Possibility of fibroids should be considered. If clinically warranted, follow-up pelvic sonogram may be considered. Other findings as described in the body of the report. Electronically Signed   By: Elmer Picker M.D.   On: 01/21/2022 13:25    Current Medications, Allergies, Past Medical History, Past Surgical History, Family History and Social History were reviewed in [employer] record.   Physical Exam: General: Well developed, well nourished, no acute distress Head: Normocephalic and atraumatic Eyes: Sclerae anicteric, EOMI Ears: Normal auditory acuity Mouth: Not examined Lungs: Clear throughout to auscultation Heart: Regular rate and rhythm; no murmurs, rubs or bruits Abdomen: Soft, non tender and non distended. No masses, hepatosplenomegaly or hernias noted. Normal Bowel sounds Rectal: Not  done Musculoskeletal: Symmetrical with no gross deformities  Pulses:  Normal pulses noted Extremities: No clubbing, cyanosis, edema or deformities noted Neurological: Alert oriented x 4, grossly nonfocal Psychological:  Alert and cooperative. Normal mood and affect   Denise [name] Plan, MD 02/19/2022, 1:42 PM

## 2022-02-19 NOTE — Patient Instructions (Signed)
We have sent the following medications to your pharmacy for you to pick up at your convenience: pantoprazole 40 mg twice daily and famotidine 40 mg at bedtime.   The [facility] providers would like to encourage you to use [facility] to communicate with providers for non-urgent requests or questions.  Due to long hold times on the telephone, sending your provider a message by [facility] may be a faster and more efficient way to get a response.  Please allow 48 business hours for a response.  Please remember that this is for non-urgent requests.   Thank you for choosing me and [facility].  Pricilla Riffle. Dagoberto Ligas., MD., Marval Regal

## 2022-02-28 ENCOUNTER — Encounter (HOSPITAL_BASED_OUTPATIENT_CLINIC_OR_DEPARTMENT_OTHER): Payer: Self-pay | Admitting: Cardiovascular Disease

## 2022-02-28 DIAGNOSIS — I10 Essential (primary) hypertension: Secondary | ICD-10-CM

## 2022-03-01 DIAGNOSIS — I10 Essential (primary) hypertension: Secondary | ICD-10-CM | POA: Diagnosis not present

## 2022-03-01 LAB — BASIC METABOLIC PANEL
BUN/Creatinine Ratio: 13 (ref 12–28)
BUN: 11 mg/dL (ref 8–27)
CO2: 26 mmol/L (ref 20–29)
Calcium: 9.4 mg/dL (ref 8.7–10.3)
Chloride: 105 mmol/L (ref 96–106)
Creatinine, Ser: 0.85 mg/dL (ref 0.57–1.00)
Glucose: 100 mg/dL — ABNORMAL HIGH (ref 70–99)
Potassium: 3.7 mmol/L (ref 3.5–5.2)
Sodium: 145 mmol/L — ABNORMAL HIGH (ref 134–144)
eGFR: 71 mL/min/{1.73_m2} (ref >59)

## 2022-03-03 ENCOUNTER — Other Ambulatory Visit: Payer: Self-pay | Admitting: Internal Medicine

## 2022-03-03 NOTE — Telephone Encounter (Signed)
Please refill as per office routine med refill policy (all routine meds to be refilled for 3 mo or monthly (per pt preference) up to one year from last visit, then month to month grace period for 3 mo, then further med refills will have to be denied) ? ?

## 2022-03-04 ENCOUNTER — Other Ambulatory Visit (HOSPITAL_COMMUNITY): Payer: Self-pay

## 2022-03-04 ENCOUNTER — Telehealth: Payer: Self-pay

## 2022-03-04 NOTE — Telephone Encounter (Signed)
Pharmacy Patient Advocate Encounter  Prior Authorization for Pantoprazole has been approved.    PA# [id number] Effective dates: 02/02/2022 through 03/04/2023    Joneen Boers, [place] Patient Advocate Specialist [place] Patient Advocate Team Direct Number: [phone] Fax: [phone]

## 2022-03-04 NOTE — Telephone Encounter (Signed)
Patient Advocate Encounter   Received notification from [place] that prior authorization for pantoprazole '40mg'$  is required.   PA submitted on 03/04/2022 Key [id number] Status is pending       Joneen Boers, [place] Patient Advocate Specialist [place] Patient Advocate Team Direct Number: [phone] Fax: [phone]

## 2022-03-06 ENCOUNTER — Other Ambulatory Visit (HOSPITAL_COMMUNITY): Payer: Self-pay

## 2022-03-08 ENCOUNTER — Other Ambulatory Visit: Payer: Self-pay | Admitting: Internal Medicine

## 2022-03-08 ENCOUNTER — Other Ambulatory Visit: Payer: Self-pay

## 2022-03-08 DIAGNOSIS — Z760 Encounter for issue of repeat prescription: Secondary | ICD-10-CM

## 2022-03-08 MED ORDER — METFORMIN HCL 500 MG PO TABS: ORAL_TABLET | ORAL | 1 refills | Status: DC

## 2022-03-08 NOTE — Telephone Encounter (Signed)
Please refill as per office routine med refill policy (all routine meds to be refilled for 3 mo or monthly (per pt preference) up to one year from last visit, then month to month grace period for 3 mo, then further med refills will have to be denied) ? ?

## 2022-03-11 MED ORDER — METFORMIN HCL 500 MG PO TABS: ORAL_TABLET | ORAL | 1 refills | Status: DC

## 2022-03-11 NOTE — Addendum Note (Signed)
Addended by: Earnstine Regal on: 03/11/2022 02:51 PM   Modules accepted: Orders

## 2022-03-11 NOTE — Telephone Encounter (Signed)
Resent metformin to pharmacy

## 2022-04-08 ENCOUNTER — Encounter: Payer: Self-pay | Admitting: Internal Medicine

## 2022-04-16 ENCOUNTER — Encounter: Payer: Self-pay | Admitting: Podiatry

## 2022-04-18 ENCOUNTER — Ambulatory Visit (INDEPENDENT_AMBULATORY_CARE_PROVIDER_SITE_OTHER): Payer: Medicare Other | Admitting: Internal Medicine

## 2022-04-18 VITALS — BP 146/88 | HR 81 | Temp 97.7°F | Ht 60.5 in | Wt 216.0 lb

## 2022-04-18 DIAGNOSIS — E119 Type 2 diabetes mellitus without complications: Secondary | ICD-10-CM

## 2022-04-18 DIAGNOSIS — E538 Deficiency of other specified B group vitamins: Secondary | ICD-10-CM

## 2022-04-18 DIAGNOSIS — E7800 Pure hypercholesterolemia, unspecified: Secondary | ICD-10-CM

## 2022-04-18 DIAGNOSIS — I10 Essential (primary) hypertension: Secondary | ICD-10-CM

## 2022-04-18 DIAGNOSIS — I6523 Occlusion and stenosis of bilateral carotid arteries: Secondary | ICD-10-CM

## 2022-04-18 DIAGNOSIS — J45901 Unspecified asthma with (acute) exacerbation: Secondary | ICD-10-CM | POA: Insufficient documentation

## 2022-04-18 DIAGNOSIS — E559 Vitamin D deficiency, unspecified: Secondary | ICD-10-CM

## 2022-04-18 DIAGNOSIS — J4531 Mild persistent asthma with (acute) exacerbation: Secondary | ICD-10-CM | POA: Diagnosis not present

## 2022-04-18 LAB — HEMOGLOBIN A1C: Hgb A1c MFr Bld: 6.4 % (ref 4.6–6.5)

## 2022-04-18 LAB — URINALYSIS, ROUTINE W REFLEX MICROSCOPIC
Hgb urine dipstick: NEGATIVE
Leukocytes,Ua: NEGATIVE
Nitrite: NEGATIVE
RBC / HPF: NONE SEEN (ref 0–?)
Specific Gravity, Urine: 1.02 (ref 1.000–1.030)
Urine Glucose: NEGATIVE
Urobilinogen, UA: 0.2 (ref 0.0–1.0)
pH: 6 (ref 5.0–8.0)

## 2022-04-18 LAB — CBC WITH DIFFERENTIAL/PLATELET
Basophils Absolute: 0.1 10*3/uL (ref 0.0–0.1)
Basophils Relative: 0.8 % (ref 0.0–3.0)
Eosinophils Absolute: 0.2 10*3/uL (ref 0.0–0.7)
Eosinophils Relative: 1.6 % (ref 0.0–5.0)
HCT: 39.4 % (ref 36.0–46.0)
Hemoglobin: 12.9 g/dL (ref 12.0–15.0)
Lymphocytes Relative: 33.9 % (ref 12.0–46.0)
Lymphs Abs: 3.8 10*3/uL (ref 0.7–4.0)
MCHC: 32.8 g/dL (ref 30.0–36.0)
MCV: 79.5 fl (ref 78.0–100.0)
Monocytes Absolute: 0.7 10*3/uL (ref 0.1–1.0)
Monocytes Relative: 6.8 % (ref 3.0–12.0)
Neutro Abs: 6.3 10*3/uL (ref 1.4–7.7)
Neutrophils Relative %: 56.9 % (ref 43.0–77.0)
Platelets: 215 10*3/uL (ref 150.0–400.0)
RBC: 4.95 Mil/uL (ref 3.87–5.11)
RDW: 16.4 % — ABNORMAL HIGH (ref 11.5–15.5)
WBC: 11.1 10*3/uL — ABNORMAL HIGH (ref 4.0–10.5)

## 2022-04-18 LAB — VITAMIN D 25 HYDROXY (VIT D DEFICIENCY, FRACTURES): VITD: 40.49 ng/mL (ref 30.00–100.00)

## 2022-04-18 LAB — BASIC METABOLIC PANEL
BUN: 13 mg/dL (ref 6–23)
CO2: 30 mEq/L (ref 19–32)
Calcium: 9.5 mg/dL (ref 8.4–10.5)
Chloride: 104 mEq/L (ref 96–112)
Creatinine, Ser: 0.86 mg/dL (ref 0.40–1.20)
GFR: 65.46 mL/min (ref >60.00)
Glucose, Bld: 130 mg/dL — ABNORMAL HIGH (ref 70–99)
Potassium: 3.9 mEq/L (ref 3.5–5.1)
Sodium: 143 mEq/L (ref 135–145)

## 2022-04-18 LAB — LIPID PANEL
Cholesterol: 187 mg/dL (ref 0–200)
HDL: 76.3 mg/dL (ref >39.00)
LDL Cholesterol: 89 mg/dL (ref 0–99)
NonHDL: 110.26
Total CHOL/HDL Ratio: 2
Triglycerides: 107 mg/dL (ref 0.0–149.0)
VLDL: 21.4 mg/dL (ref 0.0–40.0)

## 2022-04-18 LAB — HEPATIC FUNCTION PANEL
ALT: 18 U/L (ref 0–35)
AST: 22 U/L (ref 0–37)
Albumin: 4.5 g/dL (ref 3.5–5.2)
Alkaline Phosphatase: 60 U/L (ref 39–117)
Bilirubin, Direct: 0.1 mg/dL (ref 0.0–0.3)
Total Bilirubin: 0.6 mg/dL (ref 0.2–1.2)
Total Protein: 7.9 g/dL (ref 6.0–8.3)

## 2022-04-18 LAB — VITAMIN B12: Vitamin B-12: 213 pg/mL (ref 211–911)

## 2022-04-18 LAB — MICROALBUMIN / CREATININE URINE RATIO
Creatinine,U: 251.3 mg/dL
Microalb Creat Ratio: 0.7 mg/g (ref 0.0–30.0)
Microalb, Ur: 1.7 mg/dL (ref 0.0–1.9)

## 2022-04-18 LAB — TSH: TSH: 0.91 u[IU]/mL (ref 0.35–5.50)

## 2022-04-18 MED ORDER — PREDNISONE 10 MG PO TABS: ORAL_TABLET | ORAL | 0 refills | Status: DC
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
## 2022-04-18 NOTE — Patient Instructions (Addendum)
Please have your Shingrix (shingles) shots done at your local pharmacy.  Please take all new medication as prescribed - the prednisone  Please continue all other medications as before, and refills have been done if requested.  Please have the pharmacy call with any other refills you may need.  Please continue your efforts at being more active, low cholesterol diet, and weight control.  Please keep your appointments with your specialists as you may have planned  Please go to the LAB at the blood drawing area for the tests to be done  You will be contacted by phone if any changes need to be made immediately.  Otherwise, you will receive a letter about your results with an explanation, but please check with MyChart first.  Please remember to sign up for MyChart if you have not done so, as this will be important to you in the future with finding out test results, communicating by private email, and scheduling acute appointments online when needed.  Please make an Appointment to return in 6 months, or sooner if needed

## 2022-04-19 ENCOUNTER — Encounter: Payer: Self-pay | Admitting: Internal Medicine

## 2022-04-19 NOTE — Assessment & Plan Note (Signed)
Lab Results  Component Value Date   HGBA1C 6.4 04/18/2022   Stable, pt to continue current medical treatment metfomrin 500 mg 2 tab bid

## 2022-04-19 NOTE — Assessment & Plan Note (Signed)
Lab Results  Component Value Date   LDLCALC 89 04/18/2022   Uncontrolled, goal ldl < 70, pt to continue current statin lipitor 40 mg qd and lower chol diet, declines change for now

## 2022-04-19 NOTE — Assessment & Plan Note (Signed)
Mild uncontrolled, for prednisone taper,  cont in haler prn, to f/u any worsening symptoms or concerns

## 2022-04-19 NOTE — Assessment & Plan Note (Signed)
BP Readings from Last 3 Encounters:  04/18/22 (!) 146/88  02/19/22 (!) 150/80  02/05/22 (!) 148/82   Uncontrolled, but improved overall, may be an element reactive, pt to continue medical treatment norvasc 5 qd, diovan 80 qm, 40 pm

## 2022-04-30 ENCOUNTER — Encounter: Payer: Self-pay | Admitting: Internal Medicine

## 2022-05-14 ENCOUNTER — Other Ambulatory Visit: Payer: Self-pay | Admitting: Internal Medicine

## 2022-05-14 NOTE — Telephone Encounter (Signed)
Please refill as per office routine med refill policy (all routine meds to be refilled for 3 mo or monthly (per pt preference) up to one year from last visit, then month to month grace period for 3 mo, then further med refills will have to be denied)

## 2022-05-15 ENCOUNTER — Encounter: Payer: Self-pay | Admitting: Podiatry

## 2022-05-15 ENCOUNTER — Ambulatory Visit: Payer: Medicare Other | Admitting: Podiatry

## 2022-05-15 ENCOUNTER — Ambulatory Visit (INDEPENDENT_AMBULATORY_CARE_PROVIDER_SITE_OTHER): Payer: Medicare Other | Admitting: Podiatry

## 2022-05-15 VITALS — BP 148/69

## 2022-05-15 DIAGNOSIS — L84 Corns and callosities: Secondary | ICD-10-CM | POA: Diagnosis not present

## 2022-05-15 DIAGNOSIS — E1142 Type 2 diabetes mellitus with diabetic polyneuropathy: Secondary | ICD-10-CM

## 2022-05-15 NOTE — Progress Notes (Signed)
  Subjective:  Patient ID: Denise [name], female    DOB: 11/28/1945,  MRN: [id number]  Denise [name] presents to clinic today for preventative diabetic foot care and corn(s) bilateral 5th toes which interfere(s) with ambulation. Aggravating factors include wearing enclosed shoe gear. Pain is relieved with periodic professional debridement.  Chief Complaint  Patient presents with   Nail Problem    DFC BS-did not check A1C-6.5 PCP-[facility] PCP VST-03/2022   New problem(s): None.   PCP is Biagio Borg, MD.  Allergies  Allergen Reactions   Doxazosin Nausea And Vomiting    Dizziness    Clonidine Hydrochloride Other (See Comments)    Bradycardia   Erythromycin Palpitations   Hydrocodone-Acetaminophen Nausea Only   Pork-Derived Products Diarrhea and Nausea Only   Rosiglitazone Maleate Swelling    SWELLING REACTION UNSPECIFIED     Review of Systems: Negative except as noted in the HPI.  Objective: No changes noted in today's physical examination. Vitals:   05/15/22 0859  BP: (!) 148/69   Denise [name] is a pleasant 77 y.o. female obese in NAD. AAO x 3.  Vascular Examination: Capillary refill time immediate b/l. Vascular status intact b/l with palpable pedal pulses. Pedal hair absent. No pain with calf compression b/l. Lower extremity skin temperature gradient within normal limits. No cyanosis or clubbing noted b/l LE.  Neurological Examination: Sensation grossly intact b/l with 10 gram monofilament. Vibratory sensation intact b/l.   Dermatological Examination: Pedal skin with normal turgor, texture and tone b/l. Toenails 1-5 b/l adequate length today. No open wounds b/l LE. No interdigital macerations noted b/l LE. Hyperkeratotic lesion(s) dorsal PIPJ of bilateral 5th toes.  No erythema, no edema, no drainage, no fluctuance.  Musculoskeletal Examination: Normal muscle strength 5/5 to all lower extremity muscle groups bilaterally. Hammertoe(s) noted to  the bilateral 5th toes. No pain, crepitus or joint limitation noted with ROM b/l LE.  Patient ambulates independently without assistive aids.  Radiographs: None  Last A1c:      Latest Ref Rng & Units 04/18/2022    9:04 AM 10/17/2021    8:54 AM  Hemoglobin A1C  Hemoglobin-A1c 4.6 - 6.5 % 6.4  6.1    Assessment/Plan: 1. Corns   2. Type 2 diabetes mellitus with diabetic polyneuropathy, without long-term current use of insulin (HCC)     No orders of the defined types were placed in this encounter.   -Patient was evaluated and treated. All patient's and/or POA's questions/concerns answered on today's visit. -Discussed shoe gear that avoids rubbing digits. She wears Crocs mostly. -Continue foot and shoe inspections daily. Monitor blood glucose per PCP/Endocrinologist's recommendations. -Patient to continue soft, supportive shoe gear daily. -Corn(s) bilateral 5th toes pared utilizing sterile scalpel blade without complication or incident. Total number debrided=2. -Patient/POA to call should there be question/concern in the interim.   Return in about 3 months (around 08/14/2022).  Marzetta Board, DPM

## 2022-05-18 ENCOUNTER — Other Ambulatory Visit: Payer: Self-pay | Admitting: Internal Medicine

## 2022-05-18 NOTE — Telephone Encounter (Signed)
Please refill as per office routine med refill policy (all routine meds to be refilled for 3 mo or monthly (per pt preference) up to one year from last visit, then month to month grace period for 3 mo, then further med refills will have to be denied)

## 2022-05-20 ENCOUNTER — Other Ambulatory Visit: Payer: Self-pay

## 2022-05-20 DIAGNOSIS — E119 Type 2 diabetes mellitus without complications: Secondary | ICD-10-CM | POA: Diagnosis not present

## 2022-05-20 DIAGNOSIS — H524 Presbyopia: Secondary | ICD-10-CM | POA: Diagnosis not present

## 2022-05-20 DIAGNOSIS — H26493 Other secondary cataract, bilateral: Secondary | ICD-10-CM | POA: Diagnosis not present

## 2022-05-20 DIAGNOSIS — Z961 Presence of intraocular lens: Secondary | ICD-10-CM | POA: Diagnosis not present

## 2022-05-20 LAB — HM DIABETES EYE EXAM

## 2022-05-23 ENCOUNTER — Telehealth (HOSPITAL_BASED_OUTPATIENT_CLINIC_OR_DEPARTMENT_OTHER): Payer: Self-pay | Admitting: *Deleted

## 2022-05-23 ENCOUNTER — Encounter: Payer: Self-pay | Admitting: Internal Medicine

## 2022-05-23 MED ORDER — VALSARTAN 40 MG PO TABS: 40.0000 mg | ORAL_TABLET | Freq: Every evening | ORAL | 3 refills | Status: DC

## 2022-05-23 MED ORDER — VALSARTAN 80 MG PO TABS: 80.0000 mg | ORAL_TABLET | Freq: Every morning | ORAL | 3 refills | Status: DC

## 2022-05-23 NOTE — Telephone Encounter (Signed)
Received notification from [facility] that Valsartan 40 mg 2 am and 1 pm not covered under insurance, requested Rx for 80 and 40 mg tablets  Rx sent as requested   Mychart message sent to patient

## 2022-06-17 ENCOUNTER — Encounter: Payer: Self-pay | Admitting: Emergency Medicine

## 2022-06-17 DIAGNOSIS — G4733 Obstructive sleep apnea (adult) (pediatric): Secondary | ICD-10-CM

## 2022-06-18 ENCOUNTER — Other Ambulatory Visit (HOSPITAL_BASED_OUTPATIENT_CLINIC_OR_DEPARTMENT_OTHER): Payer: Self-pay | Admitting: Cardiovascular Disease

## 2022-06-18 ENCOUNTER — Other Ambulatory Visit: Payer: Self-pay | Admitting: Internal Medicine

## 2022-06-18 NOTE — Telephone Encounter (Signed)
Please refill as per office routine med refill policy (all routine meds to be refilled for 3 mo or monthly (per pt preference) up to one year from last visit, then month to month grace period for 3 mo, then further med refills will have to be denied) ? ?

## 2022-06-18 NOTE — Telephone Encounter (Signed)
Rx request sent to pharmacy.  

## 2022-06-19 NOTE — Telephone Encounter (Signed)
Dr. Lamonte Sakai, please see mychart messages sent by pt. It seems like pt might need to have a mask fit done. Are you okay with [place] placing order for her to have mask fit done at the sleep center?

## 2022-06-19 NOTE — Telephone Encounter (Signed)
Yes I agree with doing a mask fit consultation. Thanks

## 2022-07-10 ENCOUNTER — Ambulatory Visit (HOSPITAL_BASED_OUTPATIENT_CLINIC_OR_DEPARTMENT_OTHER): Payer: Medicare Other | Attending: Emergency Medicine | Admitting: Radiology

## 2022-07-10 DIAGNOSIS — G4733 Obstructive sleep apnea (adult) (pediatric): Secondary | ICD-10-CM

## 2022-07-18 ENCOUNTER — Other Ambulatory Visit: Payer: Self-pay | Admitting: Internal Medicine

## 2022-07-23 DIAGNOSIS — M5440 Lumbago with sciatica, unspecified side: Secondary | ICD-10-CM | POA: Diagnosis not present

## 2022-08-01 ENCOUNTER — Ambulatory Visit (INDEPENDENT_AMBULATORY_CARE_PROVIDER_SITE_OTHER): Payer: Medicare Other | Admitting: Internal Medicine

## 2022-08-01 VITALS — BP 130/78 | HR 72 | Temp 97.8°F | Ht 60.0 in | Wt 220.0 lb

## 2022-08-01 DIAGNOSIS — E119 Type 2 diabetes mellitus without complications: Secondary | ICD-10-CM

## 2022-08-01 DIAGNOSIS — I10 Essential (primary) hypertension: Secondary | ICD-10-CM

## 2022-08-01 DIAGNOSIS — J4531 Mild persistent asthma with (acute) exacerbation: Secondary | ICD-10-CM | POA: Diagnosis not present

## 2022-08-01 DIAGNOSIS — R062 Wheezing: Secondary | ICD-10-CM

## 2022-08-01 DIAGNOSIS — J069 Acute upper respiratory infection, unspecified: Secondary | ICD-10-CM

## 2022-08-01 LAB — POC SOFIA SARS ANTIGEN FIA: SARS Coronavirus 2 Ag: NEGATIVE

## 2022-08-01 LAB — POC INFLUENZA A&B (BINAX/QUICKVUE)
Influenza A, POC: NEGATIVE
Influenza B, POC: NEGATIVE

## 2022-08-01 LAB — POCT RESPIRATORY SYNCYTIAL VIRUS: RSV Rapid Ag: NEGATIVE

## 2022-08-01 MED ORDER — ALBUTEROL SULFATE HFA 108 (90 BASE) MCG/ACT IN AERS: INHALATION_SPRAY | RESPIRATORY_TRACT | 2 refills | Status: AC

## 2022-08-01 MED ORDER — METHYLPREDNISOLONE ACETATE 80 MG/ML IJ SUSP
80.0000 mg | Freq: Once | INTRAMUSCULAR | Status: AC
Start: 2022-08-01 — End: 2022-08-01
  Administered 2022-08-01: 80 mg via INTRAMUSCULAR

## 2022-08-01 MED ORDER — PREDNISONE 10 MG PO TABS: ORAL_TABLET | ORAL | 0 refills | Status: DC

## 2022-08-01 MED ORDER — AZITHROMYCIN 250 MG PO TABS: ORAL_TABLET | ORAL | 1 refills | Status: AC

## 2022-08-01 MED ORDER — HYDROCODONE BIT-HOMATROP MBR 5-1.5 MG/5ML PO SOLN: 5.0000 mL | Freq: Four times a day (QID) | ORAL | 0 refills | Status: AC | PRN

## 2022-08-01 MED ORDER — MOMETASONE FURO-FORMOTEROL FUM 200-5 MCG/ACT IN AERO: 2.0000 | INHALATION_SPRAY | Freq: Two times a day (BID) | RESPIRATORY_TRACT | 11 refills | Status: DC

## 2022-08-01 NOTE — Patient Instructions (Signed)
You had the steroid shot today  Please take all new medication as prescribed - the antibiotic, cough medicine, prednisone, and dulera inhaler  Please continue all other medications as before, and refills have been done for the Albuterol inhaler as well  Please have the pharmacy call with any other refills you may need.  Please continue your efforts at being more active, low cholesterol diabetic diet, and weight control  Please keep your appointments with your specialists as you may have planned
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
## 2022-08-04 ENCOUNTER — Encounter: Payer: Self-pay | Admitting: Internal Medicine

## 2022-08-04 NOTE — Assessment & Plan Note (Signed)
BP Readings from Last 3 Encounters:  08/01/22 130/78  05/15/22 (!) 148/69  04/18/22 (!) 146/88   Stable, pt to continue medical treatment norvasc 5 mg qd

## 2022-08-04 NOTE — Assessment & Plan Note (Signed)
Mild to mod, for antibx course, zpack cough med prn,  to f/u any worsening symptoms or concerns

## 2022-08-04 NOTE — Assessment & Plan Note (Signed)
Lab Results  Component Value Date   HGBA1C 6.4 04/18/2022   Stable, pt to continue current medical treatment metformin 500 bid

## 2022-08-04 NOTE — Assessment & Plan Note (Signed)
Mild to mod, for prednisone taper, albuterol hfa prn, change symbicort to dulera due to loss of insurance coverage,  to f/u any worsening symptoms or concerns

## 2022-08-06 ENCOUNTER — Ambulatory Visit (INDEPENDENT_AMBULATORY_CARE_PROVIDER_SITE_OTHER): Payer: Medicare Other | Admitting: Family

## 2022-08-06 ENCOUNTER — Encounter (HOSPITAL_BASED_OUTPATIENT_CLINIC_OR_DEPARTMENT_OTHER): Payer: Self-pay | Admitting: Family

## 2022-08-06 VITALS — BP 134/85 | HR 79 | Ht 60.0 in | Wt 219.0 lb

## 2022-08-06 DIAGNOSIS — R42 Dizziness and giddiness: Secondary | ICD-10-CM

## 2022-08-06 DIAGNOSIS — I25118 Atherosclerotic heart disease of native coronary artery with other forms of angina pectoris: Secondary | ICD-10-CM | POA: Diagnosis not present

## 2022-08-06 DIAGNOSIS — E785 Hyperlipidemia, unspecified: Secondary | ICD-10-CM | POA: Diagnosis not present

## 2022-08-06 DIAGNOSIS — Z952 Presence of prosthetic heart valve: Secondary | ICD-10-CM | POA: Diagnosis not present

## 2022-08-06 DIAGNOSIS — G4733 Obstructive sleep apnea (adult) (pediatric): Secondary | ICD-10-CM

## 2022-08-06 DIAGNOSIS — I10 Essential (primary) hypertension: Secondary | ICD-10-CM | POA: Diagnosis not present

## 2022-08-06 DIAGNOSIS — R55 Syncope and collapse: Secondary | ICD-10-CM

## 2022-08-06 DIAGNOSIS — I350 Nonrheumatic aortic (valve) stenosis: Secondary | ICD-10-CM | POA: Diagnosis not present
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
## 2022-08-06 NOTE — Patient Instructions (Addendum)
Medication Instructions:  Continue your current medications.   *If you need a refill on your cardiac medications before your next appointment, please call your pharmacy*  Testing/Procedures: Your physician has requested that you have an echocardiogram. Echocardiography is a painless test that uses sound waves to create images of your heart. It provides your doctor with information about the size and shape of your heart and how well your heart's chambers and valves are working. This procedure takes approximately one hour. There are no restrictions for this procedure. Please do NOT wear cologne, perfume, aftershave, or lotions (deodorant is allowed). Please arrive 15 minutes prior to your appointment time.   Follow-Up: At [facility], you and your health needs are our priority.  As part of our continuing mission to provide you with exceptional heart care, we have created designated Provider Care Teams.  These Care Teams include your primary Cardiologist (physician) and Advanced Practice Providers (APPs -  Physician Assistants and Nurse Practitioners) who all work together to provide you with the care you need, when you need it.  We recommend signing up for the patient portal called "MyChart".  Sign up information is provided on this After Visit Summary.  MyChart is used to connect with patients for Virtual Visits (Telemedicine).  Patients are able to view lab/test results, encounter notes, upcoming appointments, etc.  Non-urgent messages can be sent to your provider as well.   To learn more about what you can do with MyChart, go to [email or web address].    Your next appointment:   6-8 month(s)  Provider:   Chilton Si, MD or Gillian Shields, NP    Other Instructions     Provider Referral Exercise Program (P.R.E.P.)      12 Weeks to Wellness       What is included in the PREP?  --Health Coaching and personalized exercise prescription --Full Membership to participating  [facility] for the 12 weeks --Pre- and Post-consultations to assess progress and formulate an exercise plan for       continuation of exercise   What is my investment?  Your cost is $100 (reg. $144). This includes full membership privileges to [facility] in [place] and across the country. If you complete the post-assessment visit, any applicable enrollment costs will be waived if you decide to join the [facility].   Who will benefit from PREP?  People with challenges, including (but not limited to): ---Low back pain ---Arthritis ---Hypertension ---Diabetes ---Obesity ---Joint Replacement ---Neuromuscular Disorders ---Cancer Recovery ---Weight Loss ---Many others (as determined by provider)   Get Started Today! Ask your healthcare provider to fill out a Healthcare Provider Referral for Exercise form. Be sure to turn it in before you leave the office and send/fax/email it directly to the Wellness RN to schedule your Initial Consultation. If you have family or friends that would also benefit, please share this referral with them.   Contacts:  [facility] [phone]    Viann Fish, Wellness RN  [phone]  [email or web address]

## 2022-08-07 ENCOUNTER — Encounter: Payer: Self-pay | Admitting: Hematology and Oncology

## 2022-08-07 ENCOUNTER — Other Ambulatory Visit: Payer: Self-pay | Admitting: *Deleted

## 2022-08-07 ENCOUNTER — Telehealth: Payer: Self-pay | Admitting: *Deleted

## 2022-08-07 DIAGNOSIS — C50411 Malignant neoplasm of upper-outer quadrant of right female breast: Secondary | ICD-10-CM

## 2022-08-07 NOTE — Telephone Encounter (Signed)
Contacted regarding PREP Class referral. Left voice message to return call for more information. 

## 2022-08-08 ENCOUNTER — Telehealth: Payer: Self-pay | Admitting: *Deleted

## 2022-08-08 NOTE — Telephone Encounter (Signed)
Returned call regarding PREP class. She would like to participate at either [facility] or [facility]. We will contact with next class availability.

## 2022-08-12 ENCOUNTER — Telehealth: Payer: Self-pay

## 2022-08-12 NOTE — Telephone Encounter (Signed)
Called to discuss PREP schedule at [facility], she would like to attend next class on April 19, every M/W; assessment visit scheduled for 08/19/22 at 2pm
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
## 2022-08-21 ENCOUNTER — Encounter: Payer: Self-pay | Admitting: Podiatry

## 2022-08-21 ENCOUNTER — Ambulatory Visit (INDEPENDENT_AMBULATORY_CARE_PROVIDER_SITE_OTHER): Payer: Medicare Other | Admitting: Podiatry

## 2022-08-21 ENCOUNTER — Ambulatory Visit: Payer: Medicare Other | Admitting: Podiatry

## 2022-08-21 DIAGNOSIS — M79674 Pain in right toe(s): Secondary | ICD-10-CM | POA: Diagnosis not present

## 2022-08-21 DIAGNOSIS — M79675 Pain in left toe(s): Secondary | ICD-10-CM

## 2022-08-21 DIAGNOSIS — L84 Corns and callosities: Secondary | ICD-10-CM

## 2022-08-21 DIAGNOSIS — B351 Tinea unguium: Secondary | ICD-10-CM

## 2022-08-21 DIAGNOSIS — E1142 Type 2 diabetes mellitus with diabetic polyneuropathy: Secondary | ICD-10-CM | POA: Diagnosis not present

## 2022-08-21 NOTE — Progress Notes (Signed)
This patient presents to the office with chief complaint of long thick painful nails.  Patient says the nails are painful walking and wearing shoes.  This patient is unable to self treat.  This patient is unable to trim her nails since she is unable to reach her nails.  She presents to the office for preventative foot care services.  General Appearance  Alert, conversant and in no acute stress.  Vascular  Dorsalis pedis and posterior tibial  pulses are palpable  bilaterally.  Capillary return is within normal limits  bilaterally. Temperature is within normal limits  bilaterally.  Neurologic  Senn-Weinstein monofilament wire test within normal limits  bilaterally. Muscle power within normal limits bilaterally.  Nails Thick disfigured discolored nails with subungual debris  from hallux to fifth toes bilaterally. No evidence of bacterial infection or drainage bilaterally.  Orthopedic  No limitations of motion  feet .  No crepitus or effusions noted.  No bony pathology .  Asymptomatic  HD  5th  B/L.  Skin  normotropic skin with no porokeratosis noted bilaterally.  No signs of infections or ulcers noted.   Asymptomatic  HD  5th  B/L.  Onychomycosis  Nails  B/L.  Pain in right toes  Pain in left toes  Debridement of nails both feet followed trimming the nails with dremel tool.    RTC 3 months.   Helane Gunther DPM

## 2022-08-26 NOTE — Progress Notes (Signed)
[facility] PREP Weekly Session  Patient Details  Name: Denise [name] MRN: [id number] Date of Birth: 1945-04-30 Age: 77 y.o. PCP: Corwin Levins, MD  There were no vitals filed for this visit.   [facility] Weekly seesion - 08/26/22 1600       [facility] "PREP" Location   [facility] "PREP" Location [facility]      Weekly Session   Topic Discussed Goal setting and welcome to the program   Introductions, review of notebook, tour of facility   Classes attended to date 1             Sonia Baller 08/26/2022, 4:42 PM

## 2022-08-28 ENCOUNTER — Encounter: Payer: Self-pay | Admitting: Internal Medicine

## 2022-08-28 DIAGNOSIS — Z01419 Encounter for gynecological examination (general) (routine) without abnormal findings: Secondary | ICD-10-CM

## 2022-09-02 NOTE — Progress Notes (Signed)
[facility] PREP Weekly Session  Patient Details  Name: Denise [name] MRN: [id number] Date of Birth: 02/15/1946 Age: 77 y.o. PCP: Corwin Levins, MD  Vitals:   09/02/22 1647  Weight: 218 lb (98.9 kg)     [facility] Weekly seesion - 09/02/22 1600       [facility] "PREP" Location   [facility] "PREP" Location [facility]      Weekly Session   Topic Discussed Importance of resistance training;Other ways to be active   cardio: work up to 150 minutes/wk; strength training 2-3 times a wk for 20-40 minutes   Classes attended to date 3             Chelsa Stout B Ovie Cornelio 09/02/2022, 4:47 PM

## 2022-09-04 ENCOUNTER — Ambulatory Visit (INDEPENDENT_AMBULATORY_CARE_PROVIDER_SITE_OTHER): Payer: Medicare Other

## 2022-09-04 DIAGNOSIS — I350 Nonrheumatic aortic (valve) stenosis: Secondary | ICD-10-CM

## 2022-09-04 DIAGNOSIS — Z952 Presence of prosthetic heart valve: Secondary | ICD-10-CM | POA: Diagnosis not present

## 2022-09-04 LAB — ECHOCARDIOGRAM COMPLETE
AR max vel: 1.03 cm2
AV Area VTI: 1.09 cm2
AV Area mean vel: 1.03 cm2
AV Mean grad: 17 mmHg
AV Peak grad: 32.5 mmHg
Ao pk vel: 2.85 m/s
Area-P 1/2: 3.03 cm2
Calc EF: 47.6 %
P 1/2 time: 465 msec
S' Lateral: 2.02 cm
Single Plane A2C EF: 54.3 %
Single Plane A4C EF: 39.5 %

## 2022-09-09 NOTE — Progress Notes (Signed)
[facility] PREP Weekly Session  Patient Details  Name: Denise [name] MRN: [id number] Date of Birth: 1945-08-06 Age: 77 y.o. PCP: Corwin Levins, MD  Vitals:   09/09/22 1655  Weight: 219 lb (99.3 kg)     [facility] Weekly seesion - 09/09/22 1600       [facility] "PREP" Location   [facility] "PREP" Location [facility]      Weekly Session   Topic Discussed Healthy eating tips   introduced to [employer]; foods to reduce, foods to increase; eat the rainbow of colors   Minutes exercised this week 90 minutes    Classes attended to date 5             [name] B [name] 09/09/2022, 4:55 PM

## 2022-09-11 ENCOUNTER — Encounter: Payer: Self-pay | Admitting: Internal Medicine

## 2022-09-12 ENCOUNTER — Encounter: Payer: Self-pay | Admitting: Emergency Medicine

## 2022-09-12 ENCOUNTER — Ambulatory Visit (INDEPENDENT_AMBULATORY_CARE_PROVIDER_SITE_OTHER): Payer: Medicare Other | Admitting: Emergency Medicine

## 2022-09-12 VITALS — BP 152/100 | HR 85 | Temp 98.4°F | Ht 60.0 in

## 2022-09-12 DIAGNOSIS — Z8709 Personal history of other diseases of the respiratory system: Secondary | ICD-10-CM

## 2022-09-12 DIAGNOSIS — J22 Unspecified acute lower respiratory infection: Secondary | ICD-10-CM | POA: Insufficient documentation

## 2022-09-12 MED ORDER — PREDNISONE 20 MG PO TABS
40.0000 mg | ORAL_TABLET | Freq: Every day | ORAL | 0 refills | Status: AC
Start: 2022-09-12 — End: 2022-09-17

## 2022-09-12 MED ORDER — AZITHROMYCIN 250 MG PO TABS
ORAL_TABLET | ORAL | 0 refills | Status: DC
Start: 2022-09-12 — End: 2022-10-23
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
## 2022-09-12 NOTE — Assessment & Plan Note (Signed)
Clinically stable.  No signs of pneumonia. History of asthma Chest congestion on exam Will benefit from daily azithromycin for 5 days and 40 mg of prednisone daily for 5 days ED precautions given Advised to rest and stay well-hydrated Cough management discussed Advised to contact the office if no better or worse during the next several days.

## 2022-09-12 NOTE — Patient Instructions (Signed)

## 2022-09-16 NOTE — Progress Notes (Signed)
[facility] PREP Weekly Session  Patient Details  Name: Denise [name] MRN: [id number] Date of Birth: [birth year] Age: 77 y.o. PCP: Corwin Levins, MD  Vitals:   09/16/22 1648  Weight: 217 lb 14.4 oz (98.8 kg)     [facility] Weekly seesion - 09/16/22 1600       [facility] "PREP" Location   [facility] "PREP" Location [facility]      Weekly Session   Topic Discussed Health habits   Sugar demo; water: 1/2 body wt in oz   Minutes exercised this week 230 minutes    Classes attended to date 42             Sonia Baller 09/16/2022, 4:49 PM

## 2022-09-23 ENCOUNTER — Other Ambulatory Visit: Payer: Self-pay

## 2022-09-23 ENCOUNTER — Encounter (HOSPITAL_BASED_OUTPATIENT_CLINIC_OR_DEPARTMENT_OTHER): Payer: Self-pay

## 2022-09-23 ENCOUNTER — Emergency Department (HOSPITAL_BASED_OUTPATIENT_CLINIC_OR_DEPARTMENT_OTHER)
Admission: EM | Admit: 2022-09-23 | Discharge: 2022-09-23 | Disposition: A | Discharge status: Home / Self Care | Payer: Medicare Other | Attending: Emergency Medicine | Admitting: Emergency Medicine

## 2022-09-23 ENCOUNTER — Emergency Department (HOSPITAL_BASED_OUTPATIENT_CLINIC_OR_DEPARTMENT_OTHER): Payer: Medicare Other | Admitting: Radiology

## 2022-09-23 DIAGNOSIS — Z7982 Long term (current) use of aspirin: Secondary | ICD-10-CM | POA: Insufficient documentation

## 2022-09-23 DIAGNOSIS — J40 Bronchitis, not specified as acute or chronic: Secondary | ICD-10-CM | POA: Insufficient documentation

## 2022-09-23 DIAGNOSIS — Z1152 Encounter for screening for COVID-19: Secondary | ICD-10-CM | POA: Insufficient documentation

## 2022-09-23 DIAGNOSIS — R0602 Shortness of breath: Secondary | ICD-10-CM | POA: Diagnosis not present

## 2022-09-23 DIAGNOSIS — R059 Cough, unspecified: Secondary | ICD-10-CM | POA: Diagnosis not present

## 2022-09-23 DIAGNOSIS — Z7984 Long term (current) use of oral hypoglycemic drugs: Secondary | ICD-10-CM | POA: Insufficient documentation

## 2022-09-23 DIAGNOSIS — R079 Chest pain, unspecified: Secondary | ICD-10-CM

## 2022-09-23 DIAGNOSIS — Z79899 Other long term (current) drug therapy: Secondary | ICD-10-CM | POA: Diagnosis not present

## 2022-09-23 DIAGNOSIS — R0789 Other chest pain: Secondary | ICD-10-CM | POA: Diagnosis not present

## 2022-09-23 LAB — COMPREHENSIVE METABOLIC PANEL
ALT: 21 U/L (ref 0–44)
AST: 20 U/L (ref 15–41)
Albumin: 4 g/dL (ref 3.5–5.0)
Alkaline Phosphatase: 46 U/L (ref 38–126)
Anion gap: 8 (ref 5–15)
BUN: 14 mg/dL (ref 8–23)
CO2: 29 mmol/L (ref 22–32)
Calcium: 9 mg/dL (ref 8.9–10.3)
Chloride: 107 mmol/L (ref 98–111)
Creatinine, Ser: 0.78 mg/dL (ref 0.44–1.00)
GFR, Estimated: >60 mL/min (ref >60)
Glucose, Bld: 107 mg/dL — ABNORMAL HIGH (ref 70–99)
Potassium: 3.5 mmol/L (ref 3.5–5.1)
Sodium: 144 mmol/L (ref 135–145)
Total Bilirubin: 0.6 mg/dL (ref 0.3–1.2)
Total Protein: 6.6 g/dL (ref 6.5–8.1)

## 2022-09-23 LAB — CBC WITH DIFFERENTIAL/PLATELET
Abs Immature Granulocytes: 0.05 10*3/uL (ref 0.00–0.07)
Basophils Absolute: 0.1 10*3/uL (ref 0.0–0.1)
Basophils Relative: 1 %
Eosinophils Absolute: 0.3 10*3/uL (ref 0.0–0.5)
Eosinophils Relative: 3 %
HCT: 35.4 % — ABNORMAL LOW (ref 36.0–46.0)
Hemoglobin: 11.9 g/dL — ABNORMAL LOW (ref 12.0–15.0)
Immature Granulocytes: 1 %
Lymphocytes Relative: 30 %
Lymphs Abs: 3.1 10*3/uL (ref 0.7–4.0)
MCH: 27 pg (ref 26.0–34.0)
MCHC: 33.6 g/dL (ref 30.0–36.0)
MCV: 80.3 fL (ref 80.0–100.0)
Monocytes Absolute: 0.8 10*3/uL (ref 0.1–1.0)
Monocytes Relative: 8 %
Neutro Abs: 5.9 10*3/uL (ref 1.7–7.7)
Neutrophils Relative %: 57 %
Platelets: 206 10*3/uL (ref 150–400)
RBC: 4.41 MIL/uL (ref 3.87–5.11)
RDW: 16.2 % — ABNORMAL HIGH (ref 11.5–15.5)
WBC: 10.1 10*3/uL (ref 4.0–10.5)
nRBC: 0 % (ref 0.0–0.2)

## 2022-09-23 LAB — URINALYSIS, ROUTINE W REFLEX MICROSCOPIC
Bilirubin Urine: NEGATIVE
Glucose, UA: NEGATIVE mg/dL
Hgb urine dipstick: NEGATIVE
Ketones, ur: NEGATIVE mg/dL
Leukocytes,Ua: NEGATIVE
Nitrite: NEGATIVE
Protein, ur: NEGATIVE mg/dL
Specific Gravity, Urine: 1.017 (ref 1.005–1.030)
pH: 7 (ref 5.0–8.0)

## 2022-09-23 LAB — BRAIN NATRIURETIC PEPTIDE: B Natriuretic Peptide: 82 pg/mL (ref 0.0–100.0)

## 2022-09-23 LAB — RESP PANEL BY RT-PCR (RSV, FLU A&B, COVID)  RVPGX2
Influenza A by PCR: NEGATIVE
Influenza B by PCR: NEGATIVE
Resp Syncytial Virus by PCR: NEGATIVE
SARS Coronavirus 2 by RT PCR: NEGATIVE

## 2022-09-23 LAB — TROPONIN I (HIGH SENSITIVITY)
Troponin I (High Sensitivity): 16 ng/L (ref <18)
Troponin I (High Sensitivity): 16 ng/L (ref <18)

## 2022-09-23 MED ORDER — FAMOTIDINE 20 MG PO TABS
40.0000 mg | ORAL_TABLET | Freq: Once | ORAL | Status: AC
  Administered 2022-09-23: 40 mg via ORAL
  Filled 2022-09-23: qty 2

## 2022-09-23 MED ORDER — ACETAMINOPHEN 500 MG PO TABS
1000.0000 mg | ORAL_TABLET | Freq: Once | ORAL | Status: AC
  Administered 2022-09-23: 1000 mg via ORAL
  Filled 2022-09-23: qty 2

## 2022-09-23 MED ORDER — IPRATROPIUM-ALBUTEROL 0.5-2.5 (3) MG/3ML IN SOLN
3.0000 mL | Freq: Once | RESPIRATORY_TRACT | Status: AC
  Administered 2022-09-23: 3 mL via RESPIRATORY_TRACT
  Filled 2022-09-23: qty 3

## 2022-09-23 MED ORDER — BENZONATATE 100 MG PO CAPS: 100.0000 mg | ORAL_CAPSULE | Freq: Three times a day (TID) | ORAL | 0 refills | Status: DC

## 2022-09-23 MED ORDER — ALBUTEROL SULFATE (2.5 MG/3ML) 0.083% IN NEBU: 2.5000 mg | INHALATION_SOLUTION | Freq: Four times a day (QID) | RESPIRATORY_TRACT | 0 refills | Status: AC | PRN

## 2022-09-23 MED ORDER — BENZONATATE 100 MG PO CAPS
200.0000 mg | ORAL_CAPSULE | Freq: Once | ORAL | Status: AC
  Administered 2022-09-23: 200 mg via ORAL
  Filled 2022-09-23: qty 2

## 2022-09-23 NOTE — ED Triage Notes (Signed)
POV from home c/o chest tightness, chest congestion, sinus congestion, and dry non productive cough. Pt finished azithromycin 5/20 and still c/o cough/congestion symptoms. NAD during triage

## 2022-09-23 NOTE — ED Provider Notes (Signed)
[place] EMERGENCY DEPARTMENT AT [facility] Provider Note   CSN: [id number] Arrival date & time: 09/23/22  0703     History  Chief Complaint  Patient presents with   Chest Pain    Denise [name] is a 77 y.o. female.  HPI Patient reports that she was diagnosed with bronchitis last week.  She took Zithromax and prednisone 40 mg daily for 5 days but is still having a tight cough.  She reports she has developed a tight discomfort in her upper throat.  She reports she is also started to feel kind of lightheaded or dizzy.  She reports sometimes it is when she stands up she feels like she is lightheaded but she is also about the feeling of being off balance.  She reports when the symptoms started she had a pretty bad sore throat and a lot of nasal and sinus congestion.  Those have improved somewhat.  She reports she had ear pain at the time as well but that also has improved.  She reports at the onset of illness, she had "low-grade fever" of about 99.  Now she is got nausea, loss of appetite and feeling of weakness and dizziness.  She denies feeling significantly short of breath but with cough episodes she does start to feel like she is losing her breath.  No pain or swelling of the lower legs.    Home Medications Prior to Admission medications   Medication Sig Start Date End Date Taking? Authorizing Provider  albuterol (PROVENTIL) (2.5 MG/3ML) 0.083% nebulizer solution Take 3 mLs (2.5 mg total) by nebulization every 6 (six) hours as needed for wheezing or shortness of breath. 09/23/22  Yes Evanne Matsunaga, Lebron Conners, MD  benzonatate (TESSALON) 100 MG capsule Take 1 capsule (100 mg total) by mouth every 8 (eight) hours. 09/23/22  Yes Arby Barrette, MD  acetaminophen (TYLENOL) 500 MG tablet Take 1,000 mg by mouth every 6 (six) hours as needed for moderate pain.    [provider]  albuterol (VENTOLIN HFA) 108 (90 Base) MCG/ACT inhaler INHALE 2 PUFFS BY MOUTH EVERY 6 HOURS AS NEEDED  FOR WHEEZING OR  SHORTNESS  OF  BREATH 08/01/22   Corwin Levins, MD  amLODipine (NORVASC) 5 MG tablet Take 1 tablet by mouth once daily 06/18/22   Chilton Si, MD  aspirin 81 MG EC tablet Take 81 mg by mouth daily.    [provider]  atorvastatin (LIPITOR) 40 MG tablet Take 1 tablet by mouth once daily 09/19/21   Corwin Levins, MD  azithromycin [facility]) 250 MG tablet Sig as indicated 09/12/22   Georgina Quint, MD  Calcium Carb-Cholecalciferol (CALCIUM 600 + D PO) Take 1 tablet by mouth daily.    [provider]  cetirizine (EQ ALLERGY RELIEF, CETIRIZINE,) 10 MG tablet TAKE 1 TABLET BY MOUTH ONCE DAILY AS NEEDED FOR  ALLERGIES 10/20/18   Corwin Levins, MD  cyclobenzaprine (FLEXERIL) 10 MG tablet Take 10 mg by mouth 3 (three) times daily as needed for muscle spasms.    [provider]  diclofenac Sodium (VOLTAREN) 1 % GEL Apply 4 g topically 4 (four) times daily. 01/10/20   Melene Plan, DO  famotidine (PEPCID) 40 MG tablet Take 1 tablet (40 mg total) by mouth at bedtime. 02/19/22   Meryl Dare, MD  fluconazole (DIFLUCAN) 150 MG tablet 1 tab by mouth every 3 days as needed 01/30/22   Corwin Levins, MD  fluticasone [facility]) 50 MCG/ACT nasal spray Place 2 sprays  into both nostrils daily. 07/31/21   Corwin Levins, MD  furosemide (LASIX) 40 MG tablet Take 1 tablet by mouth twice daily 07/18/22   Corwin Levins, MD  meclizine (ANTIVERT) 12.5 MG tablet Take 1 tablet (12.5 mg total) by mouth 3 (three) times daily as needed for dizziness. 10/17/21 10/17/22  Corwin Levins, MD  metFORMIN (GLUCOPHAGE) 500 MG tablet TAKE 2 TABLETS BY MOUTH TWICE DAILY WITH A MEAL 03/11/22   Corwin Levins, MD  mometasone-formoterol [facility]) 200-5 MCG/ACT AERO Inhale 2 puffs into the lungs 2 (two) times daily. 08/01/22   Corwin Levins, MD  Multiple Vitamins-Minerals (ALIVE WOMENS 50+) TABS Take 1 tablet by mouth daily.    [provider]  Omega-3 Fatty Acids (FISH OIL) 1000 MG CAPS Take  1,000 mg by mouth daily.    [provider]  oxyCODONE 10 MG TABS Take 1 tablet (10 mg total) by mouth every 3 (three) hours as needed for severe pain ((score 7 to 10)). 01/13/21   Donalee Citrin, MD  pantoprazole (PROTONIX) 40 MG tablet Take 1 tablet (40 mg total) by mouth 2 (two) times daily before a meal. 02/19/22   Meryl Dare, MD  Polyvinyl Alcohol-Povidone (REFRESH OP) Place 1 drop into both eyes daily as needed (dry eyes).    [provider]  potassium chloride (MICRO-K) 10 MEQ CR capsule TAKE 4 CAPSULES BY MOUTH ONCE DAILY. 06/18/22   Corwin Levins, MD  valsartan (DIOVAN) 40 MG tablet Take 1 tablet (40 mg total) by mouth every evening. 05/23/22   Chilton Si, MD  valsartan (DIOVAN) 80 MG tablet Take 1 tablet (80 mg total) by mouth every morning. 05/23/22   Chilton Si, MD      Allergies    Doxazosin, Clonidine hydrochloride, Erythromycin, Hydrocodone-acetaminophen, Pork-derived products, and Rosiglitazone maleate    Review of Systems   Review of Systems  Physical Exam Updated Vital Signs BP (!) 176/60 (BP Location: Left Arm)   Pulse 66   Temp 98.1 F (36.7 C) (Oral)   Resp 10   Ht 5\' 1"  (1.549 m)   Wt 98.4 kg   SpO2 96%   BMI 41.00 kg/m  Physical Exam Constitutional:      Comments: Alert nontoxic clinically well in appearance.  HENT:     Right Ear: Tympanic membrane normal.     Left Ear: Tympanic membrane normal.     Nose: Nose normal.     Mouth/Throat:     Mouth: Mucous membranes are moist.     Pharynx: Oropharynx is clear.     Comments: Posterior oropharynx is widely patent. Eyes:     Extraocular Movements: Extraocular movements intact.     Conjunctiva/sclera: Conjunctivae normal.     Pupils: Pupils are equal, round, and reactive to light.  Cardiovascular:     Comments: Rate regular.  2-3/6 systolic ejection murmur.  No rub or gallop Pulmonary:     Effort: Pulmonary effort is normal.     Breath sounds: Normal breath sounds.   Abdominal:     General: There is no distension.     Palpations: Abdomen is soft.     Tenderness: There is no abdominal tenderness. There is no guarding.  Musculoskeletal:        General: No swelling.     Cervical back: Neck supple. No rigidity or tenderness.     Right lower leg: No edema.     Left lower leg: No edema.     Comments: No  tenderness in the popliteal fossae or back of the calves.  No peripheral edema.  Lymphadenopathy:     Cervical: No cervical adenopathy.  Skin:    General: Skin is warm and dry.  Neurological:     General: No focal deficit present.     Mental Status: She is oriented to person, place, and time.     Coordination: Coordination normal.  Psychiatric:        Mood and Affect: Mood normal.     ED Results / Procedures / Treatments   Labs (all labs ordered are listed, but only abnormal results are displayed) Labs Reviewed  COMPREHENSIVE METABOLIC PANEL - Abnormal; Notable for the following components:      Result Value   Glucose, Bld 107 (*)    All other components within normal limits  CBC WITH DIFFERENTIAL/PLATELET - Abnormal; Notable for the following components:   Hemoglobin 11.9 (*)    HCT 35.4 (*)    RDW 16.2 (*)    All other components within normal limits  RESP PANEL BY RT-PCR (RSV, FLU A&B, COVID)  RVPGX2  BRAIN NATRIURETIC PEPTIDE  URINALYSIS, ROUTINE W REFLEX MICROSCOPIC  TROPONIN I (HIGH SENSITIVITY)  TROPONIN I (HIGH SENSITIVITY)    EKG EKG Interpretation  Date/Time:  Monday Sep 23 2022 07:22:00 EDT Ventricular Rate:  68 PR Interval:  172 QRS Duration: 92 QT Interval:  385 QTC Calculation: 410 R Axis:   18 Text Interpretation: Sinus rhythm Anterior infarct, old normalization of t wave inversion?ST depression isolated to III in previous tracing, otherwise no sig chnage Confirmed by Arby Barrette [id number]) on 09/23/2022 8:47:41 AM  Radiology DG Chest 2 View  Result Date: 09/23/2022 CLINICAL DATA:  77 year old female with  history of cough and chest pain. EXAM: CHEST - 2 VIEW COMPARISON:  Chest x-ray 04/11/2020. FINDINGS: Lung volumes are normal. No consolidative airspace disease. No pleural effusions. No pneumothorax. No evidence of pulmonary edema. Heart size is mildly enlarged. Upper mediastinal contours are within normal limits. Atherosclerotic calcifications are noted in the thoracic aorta. Status post TAVR. Orthopedic fixation hardware in the lumbar spine incidentally noted. IMPRESSION: 1. No radiographic evidence of acute cardiopulmonary disease. 2. Aortic atherosclerosis. Electronically Signed   By: Trudie Reed M.D.   On: 09/23/2022 08:14    Procedures Procedures    Medications Ordered in ED Medications  ipratropium-albuterol (DUONEB) 0.5-2.5 (3) MG/3ML nebulizer solution 3 mL (3 mLs Nebulization Given 09/23/22 0750)  famotidine (PEPCID) tablet 40 mg (40 mg Oral Given 09/23/22 0959)  acetaminophen (TYLENOL) tablet 1,000 mg (1,000 mg Oral Given 09/23/22 0958)  benzonatate (TESSALON) capsule 200 mg (200 mg Oral Given 09/23/22 6045)    ED Course/ Medical Decision Making/ A&P                             Medical Decision Making Amount and/or Complexity of Data Reviewed Labs: ordered. Radiology: ordered.  Risk OTC drugs. Prescription drug management.   Patient presents as outlined.  Initial presentation last week was diagnosed lower respiratory tract infection.  Differential diagnosis includes persistent respiratory tract infection\bronchitis\pneumonia.  With chest pain and shortness of breath also consider ACS\PE\CHF.  Will proceed with diagnostic evaluation including lab work and chest x-rays.  Patient has a tight cough but does not exhibiting objective shortness of breath and does not have hypoxia.  Will empirically give DuoNeb to see if this improves cough and chest tightness.  EKG reviewed by myself does not show acute  ischemic pattern.  2 view chest x-ray reviewed by radiology negative for acute  findings.  I have also visually reviewed this exam.  Confirmed with radiologist patchy pattern of soft tissues is the appearance of the patient's hair braids and not subcu air.  Opponent negative x 2.  Respiratory panel negative.  Normal metabolic panel.  BNP 82.  CBC normal.  Chest x-ray does not show evidence of vascular congestion and BNP is normal.  Patient has history of aortic valve replacement but at this time no signs of acute complication or CHF.  Patient's symptoms are low probability represent pulmonary embolus.  She does not have any lower extremity pain to suggest DVT, no pleuritic chest pain and no hypoxia.  Patient is alert and clinically well in appearance without objective dyspnea and increased work of breathing.  No hypoxia.  At this time with negative diagnostic workup I feel the most likely diagnosis is persistent bronchitis likely viral in nature.  Patient did feel subjective improvement with nebulizer solution.  She reports she does have a nebulizer at home and can continue to use that.  Will have her continue Tessalon Perles and extra strength Tylenol for pain.  This time I suspect that anterior neck and chest pain is due to harsh coughing.  She does have persistent dry cough.          Final Clinical Impression(s) / ED Diagnoses Final diagnoses:  Bronchitis  Chest pain, unspecified type    Rx / DC Orders ED Discharge Orders          Ordered    benzonatate (TESSALON) 100 MG capsule  Every 8 hours        09/23/22 1112    albuterol (PROVENTIL) (2.5 MG/3ML) 0.083% nebulizer solution  Every 6 hours PRN        09/23/22 1113              Arby Barrette, MD 09/23/22 1118

## 2022-09-23 NOTE — Discharge Instructions (Addendum)
1.  At this time there is no sign of a heart attack or ongoing pneumonia.  Use your nebulizer machine at home to help with cough.  You may take the Tessalon Perle every 8 hours as prescribed.  Do not chew on this or suck on it.  Swallow it whole. 2.  Follow-up with your doctor for recheck within the next 3 to 5 days. 3.  Return to the emergency department if your getting new or worsening symptoms. 4.  You may take extra strength Tylenol every 6 hours for pain.  Be sure to continue your daily Protonix.

## 2022-09-25 ENCOUNTER — Emergency Department (HOSPITAL_BASED_OUTPATIENT_CLINIC_OR_DEPARTMENT_OTHER): Payer: Medicare Other

## 2022-09-25 ENCOUNTER — Other Ambulatory Visit (HOSPITAL_BASED_OUTPATIENT_CLINIC_OR_DEPARTMENT_OTHER): Payer: Self-pay

## 2022-09-25 ENCOUNTER — Other Ambulatory Visit: Payer: Self-pay

## 2022-09-25 ENCOUNTER — Encounter (HOSPITAL_BASED_OUTPATIENT_CLINIC_OR_DEPARTMENT_OTHER): Payer: Self-pay | Admitting: Emergency Medicine

## 2022-09-25 ENCOUNTER — Emergency Department (HOSPITAL_BASED_OUTPATIENT_CLINIC_OR_DEPARTMENT_OTHER)
Admission: EM | Admit: 2022-09-25 | Discharge: 2022-09-25 | Disposition: A | Discharge status: Home / Self Care | Payer: Medicare Other | Attending: Emergency Medicine | Admitting: Emergency Medicine

## 2022-09-25 ENCOUNTER — Encounter: Payer: Self-pay | Admitting: Internal Medicine

## 2022-09-25 DIAGNOSIS — E119 Type 2 diabetes mellitus without complications: Secondary | ICD-10-CM | POA: Diagnosis not present

## 2022-09-25 DIAGNOSIS — Z79899 Other long term (current) drug therapy: Secondary | ICD-10-CM | POA: Diagnosis not present

## 2022-09-25 DIAGNOSIS — Z7982 Long term (current) use of aspirin: Secondary | ICD-10-CM | POA: Diagnosis not present

## 2022-09-25 DIAGNOSIS — Z87891 Personal history of nicotine dependence: Secondary | ICD-10-CM | POA: Insufficient documentation

## 2022-09-25 DIAGNOSIS — R1084 Generalized abdominal pain: Secondary | ICD-10-CM | POA: Insufficient documentation

## 2022-09-25 DIAGNOSIS — Z853 Personal history of malignant neoplasm of breast: Secondary | ICD-10-CM | POA: Insufficient documentation

## 2022-09-25 DIAGNOSIS — Z7984 Long term (current) use of oral hypoglycemic drugs: Secondary | ICD-10-CM | POA: Diagnosis not present

## 2022-09-25 DIAGNOSIS — M545 Low back pain, unspecified: Secondary | ICD-10-CM | POA: Diagnosis not present

## 2022-09-25 DIAGNOSIS — W010XXA Fall on same level from slipping, tripping and stumbling without subsequent striking against object, initial encounter: Secondary | ICD-10-CM | POA: Insufficient documentation

## 2022-09-25 DIAGNOSIS — I700 Atherosclerosis of aorta: Secondary | ICD-10-CM | POA: Diagnosis not present

## 2022-09-25 DIAGNOSIS — J45909 Unspecified asthma, uncomplicated: Secondary | ICD-10-CM | POA: Insufficient documentation

## 2022-09-25 DIAGNOSIS — I10 Essential (primary) hypertension: Secondary | ICD-10-CM | POA: Diagnosis not present

## 2022-09-25 DIAGNOSIS — R109 Unspecified abdominal pain: Secondary | ICD-10-CM | POA: Diagnosis not present

## 2022-09-25 DIAGNOSIS — Z7951 Long term (current) use of inhaled steroids: Secondary | ICD-10-CM | POA: Diagnosis not present

## 2022-09-25 DIAGNOSIS — T1490XA Injury, unspecified, initial encounter: Secondary | ICD-10-CM | POA: Diagnosis not present

## 2022-09-25 LAB — CBC WITH DIFFERENTIAL/PLATELET
Abs Immature Granulocytes: 0.09 10*3/uL — ABNORMAL HIGH (ref 0.00–0.07)
Basophils Absolute: 0.1 10*3/uL (ref 0.0–0.1)
Basophils Relative: 1 %
Eosinophils Absolute: 0.2 10*3/uL (ref 0.0–0.5)
Eosinophils Relative: 2 %
HCT: 39.4 % (ref 36.0–46.0)
Hemoglobin: 12.8 g/dL (ref 12.0–15.0)
Immature Granulocytes: 1 %
Lymphocytes Relative: 26 %
Lymphs Abs: 2.7 10*3/uL (ref 0.7–4.0)
MCH: 26.7 pg (ref 26.0–34.0)
MCHC: 32.5 g/dL (ref 30.0–36.0)
MCV: 82.1 fL (ref 80.0–100.0)
Monocytes Absolute: 0.7 10*3/uL (ref 0.1–1.0)
Monocytes Relative: 7 %
Neutro Abs: 6.6 10*3/uL (ref 1.7–7.7)
Neutrophils Relative %: 63 %
Platelets: 196 10*3/uL (ref 150–400)
RBC: 4.8 MIL/uL (ref 3.87–5.11)
RDW: 16.8 % — ABNORMAL HIGH (ref 11.5–15.5)
WBC: 10.4 10*3/uL (ref 4.0–10.5)
nRBC: 0 % (ref 0.0–0.2)

## 2022-09-25 LAB — COMPREHENSIVE METABOLIC PANEL
ALT: 25 U/L (ref 0–44)
AST: 26 U/L (ref 15–41)
Albumin: 4.3 g/dL (ref 3.5–5.0)
Alkaline Phosphatase: 47 U/L (ref 38–126)
Anion gap: 10 (ref 5–15)
BUN: 9 mg/dL (ref 8–23)
CO2: 26 mmol/L (ref 22–32)
Calcium: 9.6 mg/dL (ref 8.9–10.3)
Chloride: 109 mmol/L (ref 98–111)
Creatinine, Ser: 0.74 mg/dL (ref 0.44–1.00)
GFR, Estimated: >60 mL/min (ref >60)
Glucose, Bld: 104 mg/dL — ABNORMAL HIGH (ref 70–99)
Potassium: 3.8 mmol/L (ref 3.5–5.1)
Sodium: 145 mmol/L (ref 135–145)
Total Bilirubin: 0.8 mg/dL (ref 0.3–1.2)
Total Protein: 7.4 g/dL (ref 6.5–8.1)

## 2022-09-25 LAB — LIPASE, BLOOD: Lipase: 43 U/L (ref 11–51)

## 2022-09-25 MED ORDER — OXYCODONE-ACETAMINOPHEN 5-325 MG PO TABS
1.0000 | ORAL_TABLET | Freq: Once | ORAL | Status: AC
  Administered 2022-09-25: 1 via ORAL
  Filled 2022-09-25: qty 1

## 2022-09-25 MED ORDER — IOHEXOL 300 MG/ML  SOLN
100.0000 mL | Freq: Once | INTRAMUSCULAR | Status: AC | PRN
  Administered 2022-09-25: 85 mL via INTRAVENOUS

## 2022-09-25 MED ORDER — ONDANSETRON HCL 4 MG/2ML IJ SOLN
4.0000 mg | Freq: Once | INTRAMUSCULAR | Status: AC
  Administered 2022-09-25: 4 mg via INTRAVENOUS
  Filled 2022-09-25: qty 2

## 2022-09-25 NOTE — Discharge Instructions (Signed)
We evaluated you for your fall.  We obtained CT scans of your chest and abdomen as well as of your spine.  We did not find any dangerous injuries on your CT scans.  We think it is safe to go home.  Please take at 1000 mg of Tylenol every 6 hours for pain as needed.  You can also apply ice to areas of soreness and discomfort.  Please follow-up closely with your primary doctor.  If you notice any new symptoms such as severe headaches or neck pain, vomiting, fevers or chills, pain in your arms or legs, or any other new symptoms, please return for reassessment.

## 2022-09-25 NOTE — ED Provider Notes (Signed)
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 Interpretation: Sinus rhythm LVH by voltage Confirmed by Alvino Blood ([id number]) on 09/25/2022 9:33:41 AM         Imaging Studies ordered: I ordered imaging studies including CT C/A/P, CT L spine, CT T spine On my interpretation imaging demonstrates no acute injury I independently visualized and interpreted imaging. I agree with the radiologist interpretation   Medicines ordered and prescription drug management: Meds ordered this encounter  Medications   oxyCODONE-acetaminophen (PERCOCET/ROXICET) 5-325 MG per tablet 1 tablet   iohexol (OMNIPAQUE) 300 MG/ML solution 100 mL   ondansetron (ZOFRAN) injection 4 mg    -I have reviewed the patients home medicines and have made adjustments as needed   Cardiac Monitoring: The patient was maintained on a cardiac monitor.  I personally viewed and interpreted the cardiac monitored which showed an underlying rhythm of: NSR  Social Determinants of  Health:  Diagnosis or treatment significantly limited by social determinants of health: obesity   Reevaluation: After the interventions noted above, I reevaluated the patient and found that their symptoms have improved  Co morbidities that complicate the patient evaluation  Past Medical History:  Diagnosis Date   Abdominal pain, left lower quadrant 09/12/2008   ALLERGIC RHINITIS 08/24/2007   Allergy    Anemia    past hx- not current    ANXIETY 08/24/2007   Aortic stenosis, severe 07/19/2016   ASTHMA 08/24/2007   ASTHMA, WITH ACUTE EXACERBATION 03/14/2008   Breast cancer (HCC) 2017   right- radiation only    Cataract    removed bilat    DDD (degenerative disc disease), lumbar    DEGENERATIVE JOINT DISEASE 08/24/2007   DEPRESSION 08/24/2007   DIABETES MELLITUS, TYPE II 08/24/2007   ECZEMA 08/24/2007   Edema 08/24/2007   GERD 08/24/2007   not current (07/2014)- not current 2019   Heart murmur    HYPERCHOLESTEROLEMIA 08/24/2007   HYPERLIPIDEMIA 08/24/2007   HYPERTENSION 08/24/2007   Lower extremity edema 07/13/2021   Neuromuscular disorder (HCC)    numbness in feet - still has feeling   OBESITY 08/24/2007   OSTEOARTHRITIS, KNEES, BILATERAL, 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    Final Clinical Impression(s) / ED Diagnoses Final diagnoses:  Fall on same level from slipping, tripping or stumbling, initial encounter  Acute low back pain, unspecified back pain laterality, unspecified whether sciatica present      This chart was dictated using voice recognition software.  Despite best efforts to proofread,  errors can occur which can change the documentation meaning.    Lonell Grandchild, MD 09/25/22 [phone]

## 2022-09-25 NOTE — ED Triage Notes (Signed)
Pt arrives pov, to triage in wheelchair, endorses mechanical fall onto buttocks after tripping on rug. Pt c/o back pain, buttock pain and also reports upper ABD pain. PT reports felt tightening in upper ABD and Chest. Pt denies thinners, endorses pain with deep inspiration

## 2022-09-25 NOTE — ED Notes (Signed)
Discharge paperwork given and verbally understood. 

## 2022-09-27 ENCOUNTER — Encounter: Payer: Self-pay | Admitting: Internal Medicine

## 2022-09-27 ENCOUNTER — Ambulatory Visit (INDEPENDENT_AMBULATORY_CARE_PROVIDER_SITE_OTHER): Payer: Medicare Other | Admitting: Internal Medicine

## 2022-09-27 VITALS — BP 134/82 | HR 65 | Temp 98.1°F | Ht 61.0 in | Wt 216.0 lb

## 2022-09-27 DIAGNOSIS — I10 Essential (primary) hypertension: Secondary | ICD-10-CM

## 2022-09-27 DIAGNOSIS — M545 Low back pain, unspecified: Secondary | ICD-10-CM

## 2022-09-27 DIAGNOSIS — J4531 Mild persistent asthma with (acute) exacerbation: Secondary | ICD-10-CM | POA: Diagnosis not present

## 2022-09-27 DIAGNOSIS — J22 Unspecified acute lower respiratory infection: Secondary | ICD-10-CM

## 2022-09-27 DIAGNOSIS — G8929 Other chronic pain: Secondary | ICD-10-CM | POA: Diagnosis not present

## 2022-09-27 MED ORDER — PREDNISONE 10 MG PO TABS: ORAL_TABLET | ORAL | 0 refills | Status: DC

## 2022-09-27 MED ORDER — OXYCODONE HCL 5 MG PO CAPS: 5.0000 mg | ORAL_CAPSULE | Freq: Four times a day (QID) | ORAL | 0 refills | Status: DC | PRN

## 2022-09-27 NOTE — Patient Instructions (Signed)
Please take all new medication as prescribed - the oxycodone and prednisone  Please continue all other medications as before, and refills have been done if requested.  Please have the pharmacy call with any other refills you may need.  Please keep your appointments with your specialists as you may have planned - [facility] on June 25 or sooner if possible
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
## 2022-09-28 NOTE — Assessment & Plan Note (Signed)
Clinically resolved, pt reassured, no further tx needed at this time

## 2022-09-28 NOTE — Assessment & Plan Note (Signed)
BP Readings from Last 3 Encounters:  09/27/22 134/82  09/25/22 (!) 149/69  09/23/22 (!) 176/60   Stable, pt to continue medical treatment norvasc 5 qd, diovan 80 am, 40 pm

## 2022-09-28 NOTE — Assessment & Plan Note (Addendum)
With acute flare, abnormalt LS spine CT, for pain med refill oxycodone 5 mg prn,also trial short course low dose prednisone, f/u NS as planned

## 2022-09-28 NOTE — Assessment & Plan Note (Signed)
Resolved, no need further prednisone, for inhaler prn

## 2022-09-30 NOTE — Progress Notes (Signed)
[employer] PREP Weekly Session  Patient Details  Name: Denise [name] MRN: [id number] Date of Birth: Mar 11, 1946 Age: 77 y.o. PCP: Corwin Levins, MD  Vitals:   09/30/22 1647  Weight: 220 lb (99.8 kg)     [facility] Weekly seesion - 09/30/22 1600       [facility] "PREP" Location   [facility] "PRE[facility]" Location [facility]      Weekly Session   Topic Discussed Restaurant Eating   salt demo; limit salt intake to 1500-2300 mg./day   Minutes exercised this week 60 minutes    Classes attended to date 8             Kellan Raffield B Morna Flud 09/30/2022, 4:48 PM

## 2022-10-02 ENCOUNTER — Emergency Department (HOSPITAL_BASED_OUTPATIENT_CLINIC_OR_DEPARTMENT_OTHER): Payer: Medicare Other

## 2022-10-02 ENCOUNTER — Emergency Department (HOSPITAL_BASED_OUTPATIENT_CLINIC_OR_DEPARTMENT_OTHER)
Admission: EM | Admit: 2022-10-02 | Discharge: 2022-10-02 | Disposition: A | Discharge status: Home / Self Care | Payer: Medicare Other | Attending: Emergency Medicine | Admitting: Emergency Medicine

## 2022-10-02 ENCOUNTER — Other Ambulatory Visit: Payer: Self-pay

## 2022-10-02 DIAGNOSIS — R1030 Lower abdominal pain, unspecified: Secondary | ICD-10-CM | POA: Diagnosis not present

## 2022-10-02 DIAGNOSIS — R109 Unspecified abdominal pain: Secondary | ICD-10-CM

## 2022-10-02 DIAGNOSIS — Z96611 Presence of right artificial shoulder joint: Secondary | ICD-10-CM | POA: Insufficient documentation

## 2022-10-02 DIAGNOSIS — E119 Type 2 diabetes mellitus without complications: Secondary | ICD-10-CM | POA: Diagnosis not present

## 2022-10-02 DIAGNOSIS — Z96653 Presence of artificial knee joint, bilateral: Secondary | ICD-10-CM | POA: Diagnosis not present

## 2022-10-02 DIAGNOSIS — R1010 Upper abdominal pain, unspecified: Secondary | ICD-10-CM | POA: Insufficient documentation

## 2022-10-02 DIAGNOSIS — J45909 Unspecified asthma, uncomplicated: Secondary | ICD-10-CM | POA: Insufficient documentation

## 2022-10-02 DIAGNOSIS — I10 Essential (primary) hypertension: Secondary | ICD-10-CM | POA: Diagnosis not present

## 2022-10-02 DIAGNOSIS — M549 Dorsalgia, unspecified: Secondary | ICD-10-CM | POA: Diagnosis not present

## 2022-10-02 DIAGNOSIS — Z853 Personal history of malignant neoplasm of breast: Secondary | ICD-10-CM | POA: Insufficient documentation

## 2022-10-02 DIAGNOSIS — R079 Chest pain, unspecified: Secondary | ICD-10-CM | POA: Diagnosis not present

## 2022-10-02 DIAGNOSIS — M545 Low back pain, unspecified: Secondary | ICD-10-CM | POA: Diagnosis not present

## 2022-10-02 MED ORDER — HYDROMORPHONE HCL 1 MG/ML IJ SOLN
1.0000 mg | Freq: Once | INTRAMUSCULAR | Status: AC
  Administered 2022-10-02: 1 mg via INTRAMUSCULAR
  Filled 2022-10-02: qty 1

## 2022-10-02 MED ORDER — LIDOCAINE 5 % EX PTCH: 1.0000 | MEDICATED_PATCH | CUTANEOUS | 0 refills | Status: DC

## 2022-10-02 NOTE — ED Notes (Signed)
Pt fell over a week ago Pt states she hurts all around her abdomen ribs and towards her back.   Has taken pain meds PTA without relief
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
## 2022-10-02 NOTE — Discharge Instructions (Signed)
You were evaluated in the Emergency Department and after careful evaluation, we did not find any emergent condition requiring admission or further testing in the hospital.  Your exam/testing today is overall reassuring.  Symptoms seem to be due to muscle strain or spasm.  Continue the medications prescribed by your regular doctors.  Would recommend also taking Tylenol 1000 mg every 4-6 hours.  Also recommend the numbing patches daily.  Please return to the Emergency Department if you experience any worsening of your condition.   Thank you for allowing [place] to be a part of your care.

## 2022-10-02 NOTE — ED Triage Notes (Signed)
Pt reports that she fell last week onto flooring. Reports she has had pain on the right side (back ) and feels soreness in her abdomen. Pain is not better. She last took percocet/flexiril dose around 2300 without relief. Reports residual numbness to in her legs from previous surgeries.

## 2022-10-05 ENCOUNTER — Emergency Department (HOSPITAL_COMMUNITY)
Admission: EM | Admit: 2022-10-05 | Discharge: 2022-10-06 | Disposition: A | Discharge status: Home / Self Care | Payer: Medicare Other | Attending: Emergency Medicine | Admitting: Emergency Medicine

## 2022-10-05 ENCOUNTER — Encounter (HOSPITAL_COMMUNITY): Payer: Self-pay

## 2022-10-05 DIAGNOSIS — M549 Dorsalgia, unspecified: Secondary | ICD-10-CM | POA: Diagnosis not present

## 2022-10-05 DIAGNOSIS — M545 Low back pain, unspecified: Secondary | ICD-10-CM | POA: Diagnosis not present

## 2022-10-05 DIAGNOSIS — M4326 Fusion of spine, lumbar region: Secondary | ICD-10-CM | POA: Diagnosis not present

## 2022-10-05 DIAGNOSIS — Z7982 Long term (current) use of aspirin: Secondary | ICD-10-CM | POA: Insufficient documentation

## 2022-10-05 LAB — COMPREHENSIVE METABOLIC PANEL
ALT: 24 U/L (ref 0–44)
AST: 27 U/L (ref 15–41)
Albumin: 3.5 g/dL (ref 3.5–5.0)
Alkaline Phosphatase: 58 U/L (ref 38–126)
Anion gap: 12 (ref 5–15)
BUN: 13 mg/dL (ref 8–23)
CO2: 25 mmol/L (ref 22–32)
Calcium: 9.5 mg/dL (ref 8.9–10.3)
Chloride: 102 mmol/L (ref 98–111)
Creatinine, Ser: 0.98 mg/dL (ref 0.44–1.00)
GFR, Estimated: 59 mL/min — ABNORMAL LOW (ref >60)
Glucose, Bld: 135 mg/dL — ABNORMAL HIGH (ref 70–99)
Potassium: 3.7 mmol/L (ref 3.5–5.1)
Sodium: 139 mmol/L (ref 135–145)
Total Bilirubin: 0.6 mg/dL (ref 0.3–1.2)
Total Protein: 6.5 g/dL (ref 6.5–8.1)

## 2022-10-05 LAB — URINALYSIS, ROUTINE W REFLEX MICROSCOPIC
Bilirubin Urine: NEGATIVE
Glucose, UA: NEGATIVE mg/dL
Hgb urine dipstick: NEGATIVE
Ketones, ur: NEGATIVE mg/dL
Leukocytes,Ua: NEGATIVE
Nitrite: NEGATIVE
Protein, ur: NEGATIVE mg/dL
Specific Gravity, Urine: 1.029 (ref 1.005–1.030)
pH: 5 (ref 5.0–8.0)

## 2022-10-05 LAB — CBC WITH DIFFERENTIAL/PLATELET
Abs Immature Granulocytes: 0.03 10*3/uL (ref 0.00–0.07)
Basophils Absolute: 0.1 10*3/uL (ref 0.0–0.1)
Basophils Relative: 1 %
Eosinophils Absolute: 0.3 10*3/uL (ref 0.0–0.5)
Eosinophils Relative: 3 %
HCT: 37.4 % (ref 36.0–46.0)
Hemoglobin: 11.9 g/dL — ABNORMAL LOW (ref 12.0–15.0)
Immature Granulocytes: 0 %
Lymphocytes Relative: 32 %
Lymphs Abs: 2.8 10*3/uL (ref 0.7–4.0)
MCH: 26.6 pg (ref 26.0–34.0)
MCHC: 31.8 g/dL (ref 30.0–36.0)
MCV: 83.5 fL (ref 80.0–100.0)
Monocytes Absolute: 0.6 10*3/uL (ref 0.1–1.0)
Monocytes Relative: 7 %
Neutro Abs: 4.9 10*3/uL (ref 1.7–7.7)
Neutrophils Relative %: 57 %
Platelets: 210 10*3/uL (ref 150–400)
RBC: 4.48 MIL/uL (ref 3.87–5.11)
RDW: 16.2 % — ABNORMAL HIGH (ref 11.5–15.5)
WBC: 8.6 10*3/uL (ref 4.0–10.5)
nRBC: 0 % (ref 0.0–0.2)

## 2022-10-05 LAB — LIPASE, BLOOD: Lipase: 32 U/L (ref 11–51)

## 2022-10-05 NOTE — ED Triage Notes (Addendum)
Pt had mechanical fall last Wednesday; Evaluated 6/5 for R side pain and soreness of abdomen ; today while lying down started having having sharp pain to right side and low back pain; hx back surgery; pt taking flexeril and tylenol at home with intermittent relief; denies urinary issues, denies fevers

## 2022-10-06 ENCOUNTER — Emergency Department (HOSPITAL_COMMUNITY): Payer: Medicare Other

## 2022-10-06 ENCOUNTER — Other Ambulatory Visit: Payer: Self-pay

## 2022-10-06 DIAGNOSIS — M549 Dorsalgia, unspecified: Secondary | ICD-10-CM | POA: Diagnosis not present

## 2022-10-06 DIAGNOSIS — M4326 Fusion of spine, lumbar region: Secondary | ICD-10-CM | POA: Diagnosis not present

## 2022-10-06 LAB — MAGNESIUM: Magnesium: 1.7 mg/dL (ref 1.7–2.4)

## 2022-10-06 MED ORDER — OXYCODONE HCL 5 MG PO TABS: 5.0000 mg | ORAL_TABLET | ORAL | 0 refills | Status: DC | PRN

## 2022-10-06 MED ORDER — DIAZEPAM 2 MG PO TABS
2.0000 mg | ORAL_TABLET | Freq: Once | ORAL | Status: AC
  Administered 2022-10-06: 2 mg via ORAL
  Filled 2022-10-06: qty 1

## 2022-10-06 MED ORDER — CYCLOBENZAPRINE HCL 10 MG PO TABS: 10.0000 mg | ORAL_TABLET | Freq: Two times a day (BID) | ORAL | 0 refills | Status: DC | PRN

## 2022-10-06 NOTE — ED Provider Notes (Signed)
[place] EMERGENCY DEPARTMENT AT [facility] Provider Note   CSN: [id number] Arrival date & time: 10/05/22  4098     History  Chief Complaint  Patient presents with   Back Pain    Denise [name] is a 77 y.o. female.  77 year old female who presents ER today secondary to back pain.  Patient has history of back pain status post fusions.  She had a fall recently was seen here and had CTs done that showed chronic issues.  Symptoms persisted so a few days later she returned and had another CT scan that also showed the same.  No new injury since then.  She spoke with Dr. Wynetta Emery her neurosurgeon and her PCP multiple times in the last couple weeks and only she has tried lidocaine patches, muscle lectures, pain medication.  She will have some intermittent episodes of relief but then it will come back.  This is what happened last night she was feeling okay but then her back locked up she could not move or get to work because of the severe pain.  She states that she does have some feeling of numbness and heaviness in her right leg but no incontinence.   Back Pain      Home Medications Prior to Admission medications   Medication Sig Start Date End Date Taking? Authorizing Provider  acetaminophen (TYLENOL) 500 MG tablet Take 1,000 mg by mouth every 6 (six) hours as needed for moderate pain.    [provider]  albuterol (PROVENTIL) (2.5 MG/3ML) 0.083% nebulizer solution Take 3 mLs (2.5 mg total) by nebulization every 6 (six) hours as needed for wheezing or shortness of breath. 09/23/22   Arby Barrette, MD  albuterol (VENTOLIN HFA) 108 (90 Base) MCG/ACT inhaler INHALE 2 PUFFS BY MOUTH EVERY 6 HOURS AS NEEDED FOR WHEEZING OR  SHORTNESS  OF  BREATH 08/01/22   Corwin Levins, MD  amLODipine (NORVASC) 5 MG tablet Take 1 tablet by mouth once daily 06/18/22   Chilton Si, MD  aspirin 81 MG EC tablet Take 81 mg by mouth daily.    [provider]  atorvastatin (LIPITOR)  40 MG tablet Take 1 tablet by mouth once daily 09/19/21   Corwin Levins, MD  azithromycin [facility]) 250 MG tablet Sig as indicated 09/12/22   Georgina Quint, MD  benzonatate (TESSALON) 100 MG capsule Take 1 capsule (100 mg total) by mouth every 8 (eight) hours. 09/23/22   Arby Barrette, MD  Calcium Carb-Cholecalciferol (CALCIUM 600 + D PO) Take 1 tablet by mouth daily.    [provider]  cetirizine (EQ ALLERGY RELIEF, CETIRIZINE,) 10 MG tablet TAKE 1 TABLET BY MOUTH ONCE DAILY AS NEEDED FOR  ALLERGIES 10/20/18   Corwin Levins, MD  cyclobenzaprine (FLEXERIL) 10 MG tablet Take 10 mg by mouth 3 (three) times daily as needed for muscle spasms.    [provider]  diclofenac Sodium (VOLTAREN) 1 % GEL Apply 4 g topically 4 (four) times daily. 01/10/20   Melene Plan, DO  famotidine (PEPCID) 40 MG tablet Take 1 tablet (40 mg total) by mouth at bedtime. 02/19/22   Meryl Dare, MD  fluconazole (DIFLUCAN) 150 MG tablet 1 tab by mouth every 3 days as needed 01/30/22   Corwin Levins, MD  fluticasone [facility]) 50 MCG/ACT nasal spray Place 2 sprays into both nostrils daily. 07/31/21   Corwin Levins, MD  furosemide (LASIX) 40 MG tablet Take 1 tablet by mouth twice daily 07/18/22  Corwin Levins, MD  lidocaine (LIDODERM) 5 % Place 1 patch onto the skin daily. Remove & Discard patch within 12 hours or as directed by MD 10/02/22   Sabas Sous, MD  meclizine (ANTIVERT) 12.5 MG tablet Take 1 tablet (12.5 mg total) by mouth 3 (three) times daily as needed for dizziness. 10/17/21 10/17/22  Corwin Levins, MD  metFORMIN (GLUCOPHAGE) 500 MG tablet TAKE 2 TABLETS BY MOUTH TWICE DAILY WITH A MEAL 03/11/22   Corwin Levins, MD  mometasone-formoterol [facility]) 200-5 MCG/ACT AERO Inhale 2 puffs into the lungs 2 (two) times daily. 08/01/22   Corwin Levins, MD  Multiple Vitamins-Minerals (ALIVE WOMENS 50+) TABS Take 1 tablet by mouth daily.    [provider]  Omega-3 Fatty Acids (FISH OIL) 1000 MG CAPS  Take 1,000 mg by mouth daily.    [provider]  oxycodone (OXY-IR) 5 MG capsule Take 1 capsule (5 mg total) by mouth every 6 (six) hours as needed. 09/27/22   Corwin Levins, MD  pantoprazole (PROTONIX) 40 MG tablet Take 1 tablet (40 mg total) by mouth 2 (two) times daily before a meal. 02/19/22   Meryl Dare, MD  Polyvinyl Alcohol-Povidone (REFRESH OP) Place 1 drop into both eyes daily as needed (dry eyes).    [provider]  potassium chloride (MICRO-K) 10 MEQ CR capsule TAKE 4 CAPSULES BY MOUTH ONCE DAILY. 06/18/22   Corwin Levins, MD  predniSONE (DELTASONE) 10 MG tablet 2 tabs by mouth per day for 7 days 09/27/22   Corwin Levins, MD  valsartan (DIOVAN) 40 MG tablet Take 1 tablet (40 mg total) by mouth every evening. 05/23/22   Chilton Si, MD  valsartan (DIOVAN) 80 MG tablet Take 1 tablet (80 mg total) by mouth every morning. 05/23/22   Chilton Si, MD      Allergies    Doxazosin, Clonidine hydrochloride, Erythromycin, Hydrocodone-acetaminophen, Pork-derived products, and Rosiglitazone maleate    Review of Systems   Review of Systems  Musculoskeletal:  Positive for back pain.    Physical Exam Updated Vital Signs BP (!) 140/69 (BP Location: Left Arm)   Pulse 71   Temp 98 F (36.7 C)   Resp 18   SpO2 97%  Physical Exam Vitals and nursing note reviewed.  Constitutional:      Appearance: She is well-developed.  HENT:     Head: Normocephalic and atraumatic.  Eyes:     Pupils: Pupils are equal, round, and reactive to light.  Cardiovascular:     Rate and Rhythm: Normal rate and regular rhythm.  Pulmonary:     Effort: No respiratory distress.     Breath sounds: No stridor.  Abdominal:     General: There is no distension.  Musculoskeletal:     Cervical back: Normal range of motion.  Skin:    General: Skin is warm and dry.  Neurological:     General: No focal deficit present.     Mental Status: She is alert. Mental status is at baseline.      ED Results / Procedures / Treatments   Labs (all labs ordered are listed, but only abnormal results are displayed) Labs Reviewed  COMPREHENSIVE METABOLIC PANEL - Abnormal; Notable for the following components:      Result Value   Glucose, Bld 135 (*)    GFR, Estimated 59 (*)    All other components within normal limits  CBC WITH DIFFERENTIAL/PLATELET - Abnormal; Notable for the following components:  Hemoglobin 11.9 (*)    RDW 16.2 (*)    All other components within normal limits  LIPASE, BLOOD  URINALYSIS, ROUTINE W REFLEX MICROSCOPIC  MAGNESIUM    EKG None  Radiology No results found.  Procedures Procedures    Medications Ordered in ED Medications  diazepam (VALIUM) tablet 2 mg (2 mg Oral Given 10/06/22 0236)    ED Course/ Medical Decision Making/ A&P                             Medical Decision Making Amount and/or Complexity of Data Reviewed Labs: ordered. Radiology: ordered.  Risk Prescription drug management.  Already had multiple CT scans that were unrevealing.  No new injury since then.  Will get MRI just to ensure there is no complications from previous injuries especially with her leg heaviness that very well could be a radiculopathy symptom.  Overall patient's symptoms sound more muscular so she is willing to try some Valium as a muscle laxer while she is here.  Oxycodone is working at home also very things okay she may go home on another course of oxycodone pending neurosurgery follow-up in a week or 2.   Final Clinical Impression(s) / ED Diagnoses Final diagnoses:  None    Rx / DC Orders ED Discharge Orders     None         Yordy Matton, Barbara Cower, MD 10/14/22 [phone]

## 2022-10-07 ENCOUNTER — Encounter: Payer: Self-pay | Admitting: Internal Medicine

## 2022-10-07 NOTE — Progress Notes (Signed)
[facility] PREP Weekly Session  Patient Details  Name: Denise [name] MRN: [id number] Date of Birth: [birth year] Age: 77 y.o. PCP: Corwin Levins, MD  Vitals:   10/07/22 1637  Weight: 221 lb (100.2 kg)     [facility] Weekly seesion - 10/07/22 1600       [facility] "PREP" Location   [facility] "PREP" Location [facility]      Weekly Session   Topic Discussed Stress management and problem solving   importance of sleep: 7-9 hrs; finger tip mudra breath work for stress reduction   Minutes exercised this week 330 minutes    Classes attended to date 19             Sonia Baller 10/07/2022, 4:38 PM

## 2022-10-09 NOTE — Telephone Encounter (Signed)
Pt to please consider going back to ED for pain that bad  Also I can do further oxycodone if she need this soon

## 2022-10-10 ENCOUNTER — Telehealth: Payer: Self-pay

## 2022-10-10 DIAGNOSIS — S22080A Wedge compression fracture of T11-T12 vertebra, initial encounter for closed fracture: Secondary | ICD-10-CM | POA: Diagnosis not present

## 2022-10-10 DIAGNOSIS — Z6839 Body mass index (BMI) 39.0-39.9, adult: Secondary | ICD-10-CM | POA: Diagnosis not present

## 2022-10-10 NOTE — Telephone Encounter (Signed)
Transition Care Management Follow-up Telephone Call Date of discharge and from where: 10/06/2022 [facility] How have you been since you were released from the hospital? Patient is not feeling well still in a lot of pain Any questions or concerns? No  Items Reviewed: Did the pt receive and understand the discharge instructions provided? Yes  Medications obtained and verified? Yes  Other? No  Any new allergies since your discharge? No  Dietary orders reviewed? Yes Do you have support at home? Yes   Follow up appointments reviewed:  PCP Hospital f/u appt confirmed?  Patient messaged PCP and received response regarding pain med.   Scheduled to see Abundio Miu, MD on 10/21/2022 @ [employer] at [employer] f/u appt confirmed?  Patient stated that she saw her Neurologist 10/10/22.  Scheduled to see  on  @ . Are transportation arrangements needed? No  If their condition worsens, is the pt aware to call PCP or go to the Emergency Dept.? Yes Was the patient provided with contact information for the PCP's office or ED? Yes Was to pt encouraged to call back with questions or concerns? Yes  Bodhi Moradi [facility]  [facility] Population Health Community Resource Care Guide   [email or web address]  ?? [phone]   [email or web address]: [email or web address]  [email or web address]

## 2022-10-13 ENCOUNTER — Encounter (HOSPITAL_COMMUNITY): Payer: Self-pay

## 2022-10-13 ENCOUNTER — Emergency Department (HOSPITAL_COMMUNITY): Payer: Medicare Other

## 2022-10-13 ENCOUNTER — Emergency Department (HOSPITAL_COMMUNITY)
Admission: EM | Admit: 2022-10-13 | Discharge: 2022-10-13 | Disposition: A | Discharge status: Home / Self Care | Payer: Medicare Other | Attending: Emergency Medicine | Admitting: Emergency Medicine

## 2022-10-13 DIAGNOSIS — R1012 Left upper quadrant pain: Secondary | ICD-10-CM | POA: Diagnosis not present

## 2022-10-13 DIAGNOSIS — Z7984 Long term (current) use of oral hypoglycemic drugs: Secondary | ICD-10-CM | POA: Diagnosis not present

## 2022-10-13 DIAGNOSIS — J45909 Unspecified asthma, uncomplicated: Secondary | ICD-10-CM | POA: Diagnosis not present

## 2022-10-13 DIAGNOSIS — X58XXXD Exposure to other specified factors, subsequent encounter: Secondary | ICD-10-CM | POA: Diagnosis not present

## 2022-10-13 DIAGNOSIS — R0789 Other chest pain: Secondary | ICD-10-CM | POA: Diagnosis not present

## 2022-10-13 DIAGNOSIS — E119 Type 2 diabetes mellitus without complications: Secondary | ICD-10-CM | POA: Diagnosis not present

## 2022-10-13 DIAGNOSIS — E876 Hypokalemia: Secondary | ICD-10-CM | POA: Diagnosis not present

## 2022-10-13 DIAGNOSIS — R079 Chest pain, unspecified: Secondary | ICD-10-CM | POA: Diagnosis not present

## 2022-10-13 DIAGNOSIS — R109 Unspecified abdominal pain: Secondary | ICD-10-CM | POA: Diagnosis not present

## 2022-10-13 DIAGNOSIS — I10 Essential (primary) hypertension: Secondary | ICD-10-CM | POA: Diagnosis not present

## 2022-10-13 DIAGNOSIS — Z853 Personal history of malignant neoplasm of breast: Secondary | ICD-10-CM | POA: Diagnosis not present

## 2022-10-13 DIAGNOSIS — S22000D Wedge compression fracture of unspecified thoracic vertebra, subsequent encounter for fracture with routine healing: Secondary | ICD-10-CM | POA: Insufficient documentation

## 2022-10-13 DIAGNOSIS — I499 Cardiac arrhythmia, unspecified: Secondary | ICD-10-CM | POA: Diagnosis not present

## 2022-10-13 DIAGNOSIS — Z79899 Other long term (current) drug therapy: Secondary | ICD-10-CM | POA: Insufficient documentation

## 2022-10-13 DIAGNOSIS — W19XXXA Unspecified fall, initial encounter: Secondary | ICD-10-CM | POA: Diagnosis not present

## 2022-10-13 LAB — CBC WITH DIFFERENTIAL/PLATELET
Abs Immature Granulocytes: 0.04 10*3/uL (ref 0.00–0.07)
Basophils Absolute: 0.1 10*3/uL (ref 0.0–0.1)
Basophils Relative: 1 %
Eosinophils Absolute: 0.2 10*3/uL (ref 0.0–0.5)
Eosinophils Relative: 2 %
HCT: 37.7 % (ref 36.0–46.0)
Hemoglobin: 12.1 g/dL (ref 12.0–15.0)
Immature Granulocytes: 1 %
Lymphocytes Relative: 33 %
Lymphs Abs: 2.8 10*3/uL (ref 0.7–4.0)
MCH: 26.2 pg (ref 26.0–34.0)
MCHC: 32.1 g/dL (ref 30.0–36.0)
MCV: 81.6 fL (ref 80.0–100.0)
Monocytes Absolute: 0.8 10*3/uL (ref 0.1–1.0)
Monocytes Relative: 9 %
Neutro Abs: 4.8 10*3/uL (ref 1.7–7.7)
Neutrophils Relative %: 54 %
Platelets: 238 10*3/uL (ref 150–400)
RBC: 4.62 MIL/uL (ref 3.87–5.11)
RDW: 16.3 % — ABNORMAL HIGH (ref 11.5–15.5)
WBC: 8.7 10*3/uL (ref 4.0–10.5)
nRBC: 0 % (ref 0.0–0.2)

## 2022-10-13 LAB — COMPREHENSIVE METABOLIC PANEL
ALT: 23 U/L (ref 0–44)
AST: 24 U/L (ref 15–41)
Albumin: 3.7 g/dL (ref 3.5–5.0)
Alkaline Phosphatase: 77 U/L (ref 38–126)
Anion gap: 12 (ref 5–15)
BUN: 9 mg/dL (ref 8–23)
CO2: 25 mmol/L (ref 22–32)
Calcium: 8.7 mg/dL — ABNORMAL LOW (ref 8.9–10.3)
Chloride: 103 mmol/L (ref 98–111)
Creatinine, Ser: 0.91 mg/dL (ref 0.44–1.00)
GFR, Estimated: >60 mL/min (ref >60)
Glucose, Bld: 130 mg/dL — ABNORMAL HIGH (ref 70–99)
Potassium: 3.2 mmol/L — ABNORMAL LOW (ref 3.5–5.1)
Sodium: 140 mmol/L (ref 135–145)
Total Bilirubin: 0.9 mg/dL (ref 0.3–1.2)
Total Protein: 6.8 g/dL (ref 6.5–8.1)

## 2022-10-13 LAB — URINALYSIS, ROUTINE W REFLEX MICROSCOPIC
Bilirubin Urine: NEGATIVE
Glucose, UA: NEGATIVE mg/dL
Hgb urine dipstick: NEGATIVE
Ketones, ur: NEGATIVE mg/dL
Leukocytes,Ua: NEGATIVE
Nitrite: NEGATIVE
Protein, ur: NEGATIVE mg/dL
Specific Gravity, Urine: 1.016 (ref 1.005–1.030)
pH: 7 (ref 5.0–8.0)

## 2022-10-13 LAB — TROPONIN I (HIGH SENSITIVITY)
Troponin I (High Sensitivity): 11 ng/L (ref <18)
Troponin I (High Sensitivity): 11 ng/L (ref <18)

## 2022-10-13 LAB — LIPASE, BLOOD: Lipase: 27 U/L (ref 11–51)

## 2022-10-13 MED ORDER — OXYCODONE HCL 5 MG PO TABS: 5.0000 mg | ORAL_TABLET | ORAL | 0 refills | Status: DC | PRN

## 2022-10-13 MED ORDER — POTASSIUM CHLORIDE CRYS ER 20 MEQ PO TBCR
40.0000 meq | EXTENDED_RELEASE_TABLET | Freq: Once | ORAL | Status: AC
  Administered 2022-10-13: 40 meq via ORAL
  Filled 2022-10-13: qty 2

## 2022-10-13 MED ORDER — ONDANSETRON HCL 4 MG/2ML IJ SOLN
4.0000 mg | Freq: Once | INTRAMUSCULAR | Status: AC
  Administered 2022-10-13: 4 mg via INTRAVENOUS
  Filled 2022-10-13: qty 2

## 2022-10-13 MED ORDER — MORPHINE SULFATE (PF) 4 MG/ML IV SOLN
4.0000 mg | Freq: Once | INTRAVENOUS | Status: AC
  Administered 2022-10-13: 4 mg via INTRAVENOUS
  Filled 2022-10-13: qty 1

## 2022-10-13 MED ORDER — IOHEXOL 350 MG/ML SOLN
75.0000 mL | Freq: Once | INTRAVENOUS | Status: AC | PRN
  Administered 2022-10-13: 75 mL via INTRAVENOUS

## 2022-10-13 MED ORDER — OXYCODONE HCL 5 MG PO TABS
5.0000 mg | ORAL_TABLET | Freq: Once | ORAL | Status: AC
  Administered 2022-10-13: 5 mg via ORAL
  Filled 2022-10-13: qty 1

## 2022-10-13 NOTE — ED Provider Notes (Signed)
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 following components:   RDW 16.3 (*)    All other components within normal limits  URINALYSIS, ROUTINE W REFLEX MICROSCOPIC - Abnormal; Notable for the following components:   Color, Urine STRAW (*)    All other components within normal limits  LIPASE, BLOOD  TROPONIN I (HIGH SENSITIVITY)  TROPONIN I (HIGH SENSITIVITY)    Notable for as above, stable  EKG   EKG Interpretation  Date/Time:    Ventricular Rate:    PR Interval:    QRS Duration:   QT Interval:    QTC Calculation:   R Axis:     Text Interpretation:           Imaging Studies ordered: I ordered imaging studies including ct pe ct a/p I independently visualized the following imaging with scope of interpretation limited to determining acute life threatening conditions related to emergency care; findings noted above, significant for stable I independently visualized and interpreted imaging. I agree with the  radiologist interpretation   Medicines ordered and prescription drug management: Meds ordered this encounter  Medications   iohexol (OMNIPAQUE) 350 MG/ML injection 75 mL   morphine (PF) 4 MG/ML injection 4 mg   ondansetron (ZOFRAN) injection 4 mg   potassium chloride SA (KLOR-CON M) CR tablet 40 mEq   oxyCODONE (Oxy IR/ROXICODONE) immediate release tablet 5 mg   oxyCODONE (ROXICODONE) 5 MG immediate release tablet    Sig: Take 1 tablet (5 mg total) by mouth every 4 (four) hours as needed for severe pain.    Dispense:  10 tablet    Refill:  0    -I have reviewed the patients home medicines and have made adjustments as needed   Consultations Obtained: na   Cardiac Monitoring: The patient was maintained on a cardiac monitor.  I personally viewed and interpreted the  cardiac monitored which showed an underlying rhythm of: nsr  Social Determinants of Health:  Diagnosis or treatment significantly limited by social determinants of health: former smoker   Reevaluation: After the interventions noted above, I reevaluated the patient and found that they have improved  Co morbidities that complicate the patient evaluation  Past Medical History:  Diagnosis Date   Abdominal pain, left lower quadrant 09/12/2008   ALLERGIC RHINITIS 08/24/2007   Allergy    Anemia    past hx- not current    ANXIETY 08/24/2007   Aortic stenosis, severe 07/19/2016   ASTHMA 08/24/2007   ASTHMA, WITH ACUTE EXACERBATION 03/14/2008   Breast cancer (HCC) 2017   right- radiation only    Cataract    removed bilat    DDD (degenerative disc disease), lumbar    DEGENERATIVE JOINT DISEASE 08/24/2007   DEPRESSION 08/24/2007   DIABETES MELLITUS, TYPE II 08/24/2007   ECZEMA 08/24/2007   Edema 08/24/2007   GERD 08/24/2007   not current (07/2014)- not current 2019   Heart murmur    HYPERCHOLESTEROLEMIA 08/24/2007   HYPERLIPIDEMIA 08/24/2007   HYPERTENSION 08/24/2007   Lower extremity edema 07/13/2021    Neuromuscular disorder (HCC)    numbness in feet - still has feeling   OBESITY 08/24/2007   OSTEOARTHRITIS, KNEES, BILATERAL, 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    Final Clinical Impression(s) / ED Diagnoses Final diagnoses:  Abdominal pain, unspecified abdominal location  Compression fracture of thoracic vertebra with routine healing, unspecified thoracic vertebral level, subsequent encounter  Chest pain, unspecified type     This chart was dictated using voice recognition software.  Despite best efforts to proofread,  errors can occur which can change the documentation meaning.    Sloan Leiter, DO 10/13/22 [phone]

## 2022-10-13 NOTE — ED Triage Notes (Signed)
Pt from work BIB [facility] with c/o center chest pain as tightness, intermittent CP for a month d/t spinal injury, but reports this CP feels different. EKG unremarkable per EMS, Pt also report numbness to her abd and to bilateral legs from her back, pt concern if there is additional injury to her spine. VSS, A&O x4.   324 ASA PTA

## 2022-10-13 NOTE — Discharge Instructions (Addendum)
Please follow up with your spine specialist  It was a pleasure caring for you today in the emergency department.  Please return to the emergency department for any worsening or worrisome symptoms.

## 2022-10-15 NOTE — Telephone Encounter (Signed)
Per chart pt did go to ER. Have a ER F/U on 10/21/22. Closing encounter...Raechel Chute

## 2022-10-17 ENCOUNTER — Ambulatory Visit: Payer: Medicare Other | Admitting: Hematology and Oncology

## 2022-10-17 ENCOUNTER — Telehealth: Payer: Self-pay | Admitting: Internal Medicine

## 2022-10-17 DIAGNOSIS — S22080A Wedge compression fracture of T11-T12 vertebra, initial encounter for closed fracture: Secondary | ICD-10-CM | POA: Diagnosis not present

## 2022-10-17 NOTE — Telephone Encounter (Signed)
Pt has had 2 CT scans for her spine and mid area. She would like to have a discussion about these on Monday at her appt.

## 2022-10-17 NOTE — Telephone Encounter (Signed)
Ok this is noted 

## 2022-10-18 ENCOUNTER — Telehealth: Payer: Self-pay

## 2022-10-18 NOTE — Telephone Encounter (Signed)
Transition Care Management Follow-up Telephone Call Date of discharge and from where: [facility] 6/16 How have you been since you were released from the hospital? Doing ok  Any questions or concerns? No  Items Reviewed: Did the pt receive and understand the discharge instructions provided? Yes  Medications obtained and verified? Yes  Other? No  Any new allergies since your discharge? No  Dietary orders reviewed? No Do you have support at home? Yes    Follow up appointments reviewed:  PCP Hospital f/u appt confirmed? Yes  Scheduled to see PCP on 6/24 @ . Specialist Hospital f/u appt confirmed? No  Scheduled to see  on  @ . Are transportation arrangements needed? No  If their condition worsens, is the pt aware to call PCP or go to the Emergency Dept.? Yes Was the patient provided with contact information for the PCP's office or ED? Yes Was to pt encouraged to call back with questions or concerns? Yes

## 2022-10-21 ENCOUNTER — Ambulatory Visit (INDEPENDENT_AMBULATORY_CARE_PROVIDER_SITE_OTHER): Payer: Medicare Other | Admitting: Internal Medicine

## 2022-10-21 ENCOUNTER — Encounter: Payer: Self-pay | Admitting: Internal Medicine

## 2022-10-21 VITALS — BP 130/84 | HR 94 | Temp 98.4°F | Ht 61.0 in | Wt 210.0 lb

## 2022-10-21 DIAGNOSIS — Z7984 Long term (current) use of oral hypoglycemic drugs: Secondary | ICD-10-CM

## 2022-10-21 DIAGNOSIS — S22000D Wedge compression fracture of unspecified thoracic vertebra, subsequent encounter for fracture with routine healing: Secondary | ICD-10-CM | POA: Diagnosis not present

## 2022-10-21 DIAGNOSIS — I10 Essential (primary) hypertension: Secondary | ICD-10-CM

## 2022-10-21 DIAGNOSIS — E119 Type 2 diabetes mellitus without complications: Secondary | ICD-10-CM | POA: Diagnosis not present

## 2022-10-21 DIAGNOSIS — E2839 Other primary ovarian failure: Secondary | ICD-10-CM

## 2022-10-21 DIAGNOSIS — R079 Chest pain, unspecified: Secondary | ICD-10-CM

## 2022-10-21 DIAGNOSIS — S22000A Wedge compression fracture of unspecified thoracic vertebra, initial encounter for closed fracture: Secondary | ICD-10-CM | POA: Insufficient documentation

## 2022-10-21 LAB — POCT GLYCOSYLATED HEMOGLOBIN (HGB A1C): Hemoglobin A1C: 6 % — AB (ref 4.0–5.6)

## 2022-10-21 MED ORDER — GABAPENTIN 100 MG PO CAPS: 100.0000 mg | ORAL_CAPSULE | Freq: Three times a day (TID) | ORAL | 3 refills | Status: AC

## 2022-10-21 NOTE — Patient Instructions (Addendum)
Please take all new medication as prescribed   - the gabapentin  Your A1c was done in the office today  Please schedule the bone density test before leaving today at the scheduling desk (where you check out) - for ONE MONTH from now to give the pain a chance to get better  Please continue all other medications as before, and refills have been done if requested.  Please have the pharmacy call with any other refills you may need.  Please continue your efforts at being more active, low cholesterol diet, and weight control.  Please keep your appointments with your specialists as you may have planned  Please make an Appointment to return in 4 months, or sooner if needed

## 2022-10-21 NOTE — Progress Notes (Signed)
[facility] PRE[facility] Weekly Session  Patient Details  Name: Francisca Langenderfer MRN: [id number] Date of Birth: 05-Dec-1945 Age: 77 y.o. PCP: Corwin Levins, MD  Vitals:   10/21/22 1639  Weight: 209 lb (94.8 kg)     [facility] Weekly seesion - 10/21/22 1600       [facility] "PREP" Location   [facility] "PREP" Location [facility]      Weekly Session   Topic Discussed Other   portion control; visualize your portion size demo; reveiw of Reduced sugar craisins nutritional/food label.   Classes attended to date 39             Sonia Baller 10/21/2022, 4:39 PM
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
## 2022-10-22 ENCOUNTER — Other Ambulatory Visit: Payer: Self-pay

## 2022-10-22 ENCOUNTER — Other Ambulatory Visit: Payer: Self-pay | Admitting: Internal Medicine

## 2022-10-22 ENCOUNTER — Other Ambulatory Visit: Payer: Self-pay | Admitting: Neurosurgery

## 2022-10-22 DIAGNOSIS — S22080A Wedge compression fracture of T11-T12 vertebra, initial encounter for closed fracture: Secondary | ICD-10-CM

## 2022-10-22 NOTE — Progress Notes (Addendum)
Cardiology Office Note   Date:  10/23/2022  ID:  Denise [name], DOB [birth year], MRN [id number] PCP:  Denise Levins, MD [facility] Cardiologist: Denise Si, MD  Reason for visit: Hospital follow-up  History of Present Illness    Denise [name] is a 77 y.o. female with a hx of severe AS s/p 23mm Sapien 3 TAVR 2018, patient prosthesis mismatch, mild caortid stenosis, diabetes, HLD, HTN, asthma, breast cancer s/p lumpectomy and XRT   He was last seen by Denise [name] in April 2024 for near syncope.  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  Patient thought her symptoms may be related to poor sleep as she was working nights.  At her appointment, orthostatics negative.  Echo ordered & unremarkable.  Refer to prep exercise program at the [facility].  She is seen today for hospital follow-up.  To note she has frequent ED visits.  On June 16, she was to the ED with intermittent chest pain over the past month, worsening in the middle of the night.  Described as sharp stabbing, beginning in her back and radiated to the chest wall.  Reproducible with palpation torso movement.  ED provider noted patient was recently diagnosed with compression fracture and was seeing neurosurgery for spine brace.  Troponins negative.  Patient recommended to follow-up with her spine specialist.  Today, patient states she is recovering post fall on May 29.  She already had history of spinal disease with spinal fusion 2022 with Denise [name].  With new compression fractures, she continues to have intermittent upper abdominal spasms radiating to her back with intermittent leg numbness and nausea.  Denise [name], her PCP prescribed gabapentin on Monday and this seems to help.  She is taking a break on her CPAP but plans to restart it as her pain has improved.  She was going to the [facility] prep program -she is taking a break  from exercises due to her injury.  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    From a cardiac standpoint, she denies chest pain, shortness of breath, PND, orthopnea and lower extremity edema.  No palpitations, lightheadedness or syncope.  No issues with her medications.   Objective / Physical Exam   EKG today: Normal sinus rhythm, heart rate 88, minimal voltage criteria for LVH, inferior infarct.  Vital signs:  BP 122/78   Pulse 97   Ht 5\' 1"  (1.549 m)   Wt 212 lb (96.2 kg)   SpO2 93%   BMI 40.06 kg/m     GEN: No acute distress CARDIAC: RRR, systolic murmur radiating to the carotids RESPIRATORY:  Clear to auscultation without rales, wheezing or rhonchi  EXTREMITIES: No edema  Assessment and Plan   Coronary artery disease, no angina -Coronary CTA in 2018: Calcification of coronary arteries with no obstructive lesions  -Precordial pain reported at recent ED visit is described more as abdominal discomfort rating from her back injury -Continue aspirin 81 mg and statin therapy. -Her blood pressure and diabetes are well-controlled.  Aortic stenosis status post TAVR 2018 -known patient prosthesis mismatch but valve functioning well; echo 08/2022 with trivial AI -SBE prophylaxis -Patient euvolemic on exam.  She denies chest pain, shortness of breath and syncope. -Recheck echo yearly.  Carotid  artery stenosis -Duplex 09/2021 with mild bilateral carotid disease  -Continue aspirin 81 mg and statin therapy.  Hypertension, well-controlled -Continue amlodipine 5 mg daily, valsartan 80 mg in the morning and 40 mg at night.  She also takes Lasix 40 mg twice daily and a potassium supplement. -Goal BP is <130/80.  Recommend DASH diet (high in vegetables, fruits, low-fat dairy products, whole grains, poultry, fish, and nuts and low in sweets, sugar-sweetened beverages, and red  meats), salt restriction and increase physical activity.  Hyperlipidemia with goal LDL less than 70 -LDL 89 in December 2023.  HDL 76 and triglycerides 107. -Continue Lipitor 40 mg daily for now.  Recommend yearly reassessment. -Discussed cholesterol lowering diets - Mediterranean diet, DASH diet, vegetarian diet, low-carbohydrate diet and avoidance of trans fats.  Discussed healthier choice substitutes.  Nuts, high-fiber foods, and fiber supplements may also improve lipids.    Disposition - Follow-up in 6 months.   Signed, Denise Kettle, PA-C  10/23/2022 [facility]

## 2022-10-23 ENCOUNTER — Encounter: Payer: Self-pay | Admitting: Physician Assistant

## 2022-10-23 ENCOUNTER — Telehealth: Payer: Self-pay

## 2022-10-23 ENCOUNTER — Ambulatory Visit: Payer: Medicare Other | Attending: Physician Assistant | Admitting: Physician Assistant

## 2022-10-23 VITALS — BP 122/78 | HR 97 | Ht 61.0 in | Wt 212.0 lb

## 2022-10-23 DIAGNOSIS — R072 Precordial pain: Secondary | ICD-10-CM | POA: Insufficient documentation

## 2022-10-23 DIAGNOSIS — Z952 Presence of prosthetic heart valve: Secondary | ICD-10-CM | POA: Diagnosis not present

## 2022-10-23 DIAGNOSIS — E785 Hyperlipidemia, unspecified: Secondary | ICD-10-CM | POA: Diagnosis not present

## 2022-10-23 DIAGNOSIS — I10 Essential (primary) hypertension: Secondary | ICD-10-CM | POA: Diagnosis not present

## 2022-10-23 DIAGNOSIS — I25118 Atherosclerotic heart disease of native coronary artery with other forms of angina pectoris: Secondary | ICD-10-CM | POA: Diagnosis not present

## 2022-10-23 NOTE — Patient Instructions (Signed)
Medication Instructions:  No Changes *If you need a refill on your cardiac medications before your next appointment, please call your pharmacy*   Lab Work: No labs If you have labs (blood work) drawn today and your tests are completely normal, you will receive your results only by: MyChart Message (if you have MyChart) OR A paper copy in the mail If you have any lab test that is abnormal or we need to change your treatment, we will call you to review the results.   Testing/Procedures: No Testing   Follow-Up: At [facility], you and your health needs are our priority.  As part of our continuing mission to provide you with exceptional heart care, we have created designated Provider Care Teams.  These Care Teams include your primary Cardiologist (physician) and Advanced Practice Providers (APPs -  Physician Assistants and Nurse Practitioners) who all work together to provide you with the care you need, when you need it.  We recommend signing up for the patient portal called "MyChart".  Sign up information is provided on this After Visit Summary.  MyChart is used to connect with patients for Virtual Visits (Telemedicine).  Patients are able to view lab/test results, encounter notes, upcoming appointments, etc.  Non-urgent messages can be sent to your provider as well.   To learn more about what you can do with MyChart, go to [email or web address].    Your next appointment:   6 month(s)  Provider:   Chilton Si, MD

## 2022-10-23 NOTE — Telephone Encounter (Signed)
Received text from pt stating she would like to withdraw from PREP because of her compression fracture from her fall; advised next class starting in August, would like to re enroll then; will contact her mid-August to confirm and set up assessment visit.

## 2022-10-24 ENCOUNTER — Encounter: Payer: Self-pay | Admitting: Internal Medicine

## 2022-10-24 NOTE — Assessment & Plan Note (Signed)
Lab Results  Component Value Date   HGBA1C 6.0 (A) 10/21/2022   Stable, pt to continue current medical treatment metfomrin 500 mg - 2 tab bid

## 2022-10-24 NOTE — Assessment & Plan Note (Signed)
Most c/w radicular pain - for gabapentin trial asd for neuritic pain

## 2022-10-24 NOTE — Assessment & Plan Note (Signed)
BP Readings from Last 3 Encounters:  10/23/22 122/78  10/21/22 130/84  10/13/22 122/75   Stable, pt to continue medical treatment norvasc 5 mg, diovan 80

## 2022-10-24 NOTE — Assessment & Plan Note (Signed)
Last dxa 2017 normal, will need f/u dxa in 1 month, f/u surgury as planned

## 2022-10-27 NOTE — Progress Notes (Signed)
Patient Care Team: Corwin Levins, MD as PCP - General Chilton Si, MD as PCP - Cardiology (Cardiology) Salomon Fick, NP as Nurse Practitioner (Hematology and Oncology) Antony Contras, MD as Consulting Physician (Ophthalmology)  DIAGNOSIS:  Encounter Diagnosis  Name Primary?   Malignant neoplasm of upper-outer quadrant of right breast in female, estrogen receptor positive (HCC) Yes    SUMMARY OF ONCOLOGIC HISTORY: Oncology History  Breast cancer of upper-outer quadrant of right female breast (HCC)  09/18/2015 Initial Diagnosis   Screening right breast mass 9:00: 1 x 0.9 x 0.7 cm: Grade 2 IDC plus DCIS ER 100%, PR 90%, HER-2 negative ratio 1.38, Ki-67 10%; UOQ lesion 3 mm calcification plus distortion: Bx rec; T1 BN 0 stage IA clinical stage   10/17/2015 Surgery   Right lumpectomy ([facility]): IDC grade 2, 1.2 cm, intermediate grade DCIS, margins neg although < 0.1 cm to medial and superior margins, 0/1 LN negative, ER 100%, PR 90%, HER-2 negative ratio 1.37, T1 cN0 stage IA, Oncotype DX 15, 9% ROR, low risk   11/27/2015 - 01/15/2016 Radiation Therapy   Adjuvant radiation therapy Mitzi Hansen). Right breast: 50.4 Gy in 28 fractions. Right breast boost: 14 Gy in 7 fractions.    02/08/2016 -  Anti-estrogen oral therapy   Anastrozole 1 mg daily. Planned duration of therapy: 5 years.      CHIEF COMPLIANT:  Follow-up of right breast cancer on anastrozole therapy   INTERVAL HISTORY: Denise [name] is a 77 y.o. with above-mentioned history of right breast cancer treated with lumpectomy, radiation, and who is currently on anastrozole therapy. She presents to the clinic today for a follow-up. Pt reports that she had a fall last month and fracture some ribs. She said that she fell on her Gluteal. She states that she is better now.   ALLERGIES:  is allergic to doxazosin, clonidine hydrochloride, erythromycin, hydrocodone-acetaminophen, pork-derived products, and rosiglitazone  maleate.  MEDICATIONS:  Current Outpatient Medications  Medication Sig Dispense Refill   albuterol (PROVENTIL) (2.5 MG/3ML) 0.083% nebulizer solution Take 3 mLs (2.5 mg total) by nebulization every 6 (six) hours as needed for wheezing or shortness of breath. 75 mL 0   albuterol (VENTOLIN HFA) 108 (90 Base) MCG/ACT inhaler INHALE 2 PUFFS BY MOUTH EVERY 6 HOURS AS NEEDED FOR WHEEZING OR  SHORTNESS  OF  BREATH 9 g 2   amLODipine (NORVASC) 5 MG tablet Take 1 tablet by mouth once daily 90 tablet 1   aspirin 81 MG EC tablet Take 81 mg by mouth daily.     atorvastatin (LIPITOR) 40 MG tablet Take 1 tablet by mouth once daily 90 tablet 3   Calcium Carb-Cholecalciferol (CALCIUM 600 + D PO) Take 1 tablet by mouth daily.     cetirizine (EQ ALLERGY RELIEF, CETIRIZINE,) 10 MG tablet TAKE 1 TABLET BY MOUTH ONCE DAILY AS NEEDED FOR  ALLERGIES 90 tablet 3   cyclobenzaprine (FLEXERIL) 10 MG tablet Take 1 tablet (10 mg total) by mouth 3 (three) times daily as needed for muscle spasms. 30 tablet 0   diclofenac Sodium (VOLTAREN) 1 % GEL Apply 4 g topically 4 (four) times daily. 100 g 0   famotidine (PEPCID) 40 MG tablet Take 1 tablet (40 mg total) by mouth at bedtime. 90 tablet 3   fluconazole (DIFLUCAN) 150 MG tablet 1 tab by mouth every 3 days as needed 2 tablet 1   fluticasone (FLONASE) 50 MCG/ACT nasal spray Place 2 sprays into both nostrils daily. 16 g 3   furosemide (  LASIX) 40 MG tablet Take 1 tablet by mouth twice daily 180 tablet 0   gabapentin (NEURONTIN) 100 MG capsule Take 1 capsule (100 mg total) by mouth 3 (three) times daily. 90 capsule 3   HYDROcodone-acetaminophen (NORCO) 5-325 MG tablet Take 1 tablet by mouth every 6 (six) hours as needed for moderate pain or severe pain. 15 tablet 0   lidocaine (LIDODERM) 5 % Place 1 patch onto the skin daily. Remove & Discard patch within 12 hours or as directed by MD 5 patch 0   metFORMIN (GLUCOPHAGE) 500 MG tablet TAKE 2 TABLETS BY MOUTH TWICE DAILY WITH A MEAL  360 tablet 1   mometasone-formoterol (DULERA) 200-5 MCG/ACT AERO Inhale 2 puffs into the lungs 2 (two) times daily. 13 g 11   Multiple Vitamins-Minerals (ALIVE WOMENS 50+) TABS Take 1 tablet by mouth daily.     Omega-3 Fatty Acids (FISH OIL) 1000 MG CAPS Take 1,000 mg by mouth daily.     pantoprazole (PROTONIX) 40 MG tablet Take 1 tablet (40 mg total) by mouth 2 (two) times daily before a meal. 180 tablet 3   Polyvinyl Alcohol-Povidone (REFRESH OP) Place 1 drop into both eyes daily as needed (dry eyes).     potassium chloride (MICRO-K) 10 MEQ CR capsule TAKE 4 CAPSULES BY MOUTH ONCE DAILY. 360 capsule 3   valsartan (DIOVAN) 40 MG tablet Take 1 tablet (40 mg total) by mouth every evening. 90 tablet 3   valsartan (DIOVAN) 80 MG tablet Take 1 tablet (80 mg total) by mouth every morning. 90 tablet 3   No current facility-administered medications for this visit.    PHYSICAL EXAMINATION: ECOG PERFORMANCE STATUS: 1 - Symptomatic but completely ambulatory  Vitals:   11/04/22 1148  BP: (!) 145/55  Pulse: 94  Resp: 18  Temp: (!) 97.2 F (36.2 C)  SpO2: 99%   Filed Weights   11/04/22 1148  Weight: 214 lb 3.2 oz (97.2 kg)      LABORATORY DATA:  I have reviewed the data as listed    Latest Ref Rng & Units 10/13/2022    3:13 AM 10/05/2022    7:22 PM 09/25/2022    9:31 AM  CMP  Glucose 70 - 99 mg/dL 161  096  045   BUN 8 - 23 mg/dL 9  13  9    Creatinine 0.44 - 1.00 mg/dL 4.09  8.11  9.14   Sodium 135 - 145 mmol/L 140  139  145   Potassium 3.5 - 5.1 mmol/L 3.2  3.7  3.8   Chloride 98 - 111 mmol/L 103  102  109   CO2 22 - 32 mmol/L 25  25  26    Calcium 8.9 - 10.3 mg/dL 8.7  9.5  9.6   Total Protein 6.5 - 8.1 g/dL 6.8  6.5  7.4   Total Bilirubin 0.3 - 1.2 mg/dL 0.9  0.6  0.8   Alkaline Phos 38 - 126 U/L 77  58  47   AST 15 - 41 U/L 24  27  26    ALT 0 - 44 U/L 23  24  25      Lab Results  Component Value Date   WBC 8.7 10/13/2022   HGB 12.1 10/13/2022   HCT 37.7 10/13/2022   MCV  81.6 10/13/2022   PLT 238 10/13/2022   NEUTROABS 4.8 10/13/2022    ASSESSMENT & PLAN:  Breast cancer of upper-outer quadrant of right female breast (HCC) Right lumpectomy 10/16/2068: IDC grade 2, 1.2  cm, intermediate grade DCIS, margins negative although less than 0.1 cm to medial margin and superior margin, 0/1 lymph node negative, ER 100%, PR 90%, HER-2 negative ratio 1.37, T1 cN0 stage IA Oncotype DX score 15: Risk of recurrence 9% Adjuvant radiation: 11/27/2015- 01/15/2016   Current treatment: Anastrozole 1 mg daily started 02/08/2016- 10/17/20 (stopped after 5 years due to musculoskeletal AEs)   Diabetes: on metformin Recent fall with compression fractures in the back: Extensive CT scans/MRIs did not reveal any metastatic disease.:  Currently on gabapentin for pain relief  Breast cancer surveillance:  Mammogram 10/30/2022: Breast density category B     Return to clinic on an as-needed basis    No orders of the defined types were placed in this encounter.  The patient has a good understanding of the overall plan. she agrees with it. she will call with any problems that may develop before the next visit here. Total time spent: 30 mins including face to face time and time spent for planning, charting and co-ordination of care   Tamsen Meek, MD 11/04/22    I Janan Ridge am acting as a Neurosurgeon for [employer]  I have reviewed the above documentation for accuracy and completeness, and I agree with the above.

## 2022-10-28 ENCOUNTER — Ambulatory Visit
Admission: RE | Admit: 2022-10-28 | Discharge: 2022-10-28 | Disposition: A | Discharge status: Home / Self Care | Payer: Medicare Other | Source: Ambulatory Visit | Attending: Hematology and Oncology | Admitting: Hematology and Oncology

## 2022-10-28 DIAGNOSIS — C50411 Malignant neoplasm of upper-outer quadrant of right female breast: Secondary | ICD-10-CM

## 2022-10-28 DIAGNOSIS — Z1231 Encounter for screening mammogram for malignant neoplasm of breast: Secondary | ICD-10-CM | POA: Diagnosis not present

## 2022-10-30 ENCOUNTER — Encounter: Payer: Self-pay | Admitting: Emergency Medicine

## 2022-10-30 ENCOUNTER — Ambulatory Visit (INDEPENDENT_AMBULATORY_CARE_PROVIDER_SITE_OTHER): Payer: Medicare Other | Admitting: Emergency Medicine

## 2022-10-30 VITALS — BP 140/70 | HR 75 | Temp 98.2°F | Ht 61.0 in | Wt 215.0 lb

## 2022-10-30 DIAGNOSIS — M549 Dorsalgia, unspecified: Secondary | ICD-10-CM

## 2022-10-30 MED ORDER — CYCLOBENZAPRINE HCL 10 MG PO TABS
10.0000 mg | ORAL_TABLET | Freq: Three times a day (TID) | ORAL | 0 refills | Status: DC | PRN
Start: 2022-10-30 — End: 2022-11-20

## 2022-10-30 MED ORDER — HYDROCODONE-ACETAMINOPHEN 5-325 MG PO TABS
1.0000 | ORAL_TABLET | Freq: Four times a day (QID) | ORAL | 0 refills | Status: DC | PRN
Start: 2022-10-30 — End: 2022-12-02

## 2022-10-30 NOTE — Patient Instructions (Signed)
Acute Back Pain, Adult Acute back pain is sudden and usually short-lived. It is often caused by an injury to the muscles and tissues in the back. The injury may result from: A muscle, tendon, or ligament getting overstretched or torn. Ligaments are tissues that connect bones to each other. Lifting something improperly can cause a back strain. Wear and tear (degeneration) of the spinal disks. Spinal disks are circular tissue that provide cushioning between the bones of the spine (vertebrae). Twisting motions, such as while playing sports or doing yard work. A hit to the back. Arthritis. You may have a physical exam, lab tests, and imaging tests to find the cause of your pain. Acute back pain usually goes away with rest and home care. Follow these instructions at home: Managing pain, stiffness, and swelling Take over-the-counter and prescription medicines only as told by your health care provider. Treatment may include medicines for pain and inflammation that are taken by mouth or applied to the skin, or muscle relaxants. Your health care provider may recommend applying ice during the first 24-48 hours after your pain starts. To do this: Put ice in a plastic bag. Place a towel between your skin and the bag. Leave the ice on for 20 minutes, 2-3 times a day. Remove the ice if your skin turns bright red. This is very important. If you cannot feel pain, heat, or cold, you have a greater risk of damage to the area. If directed, apply heat to the affected area as often as told by your health care provider. Use the heat source that your health care provider recommends, such as a moist heat pack or a heating pad. Place a towel between your skin and the heat source. Leave the heat on for 20-30 minutes. Remove the heat if your skin turns bright red. This is especially important if you are unable to feel pain, heat, or cold. You have a greater risk of getting burned. Activity  Do not stay in bed. Staying in  bed for more than 1-2 days can delay your recovery. Sit up and stand up straight. Avoid leaning forward when you sit or hunching over when you stand. If you work at a desk, sit close to it so you do not need to lean over. Keep your chin tucked in. Keep your neck drawn back, and keep your elbows bent at a 90-degree angle (right angle). Sit high and close to the steering wheel when you drive. Add lower back (lumbar) support to your car seat, if needed. Take short walks on even surfaces as soon as you are able. Try to increase the length of time you walk each day. Do not sit, drive, or stand in one place for more than 30 minutes at a time. Sitting or standing for long periods of time can put stress on your back. Do not drive or use heavy machinery while taking prescription pain medicine. Use proper lifting techniques. When you bend and lift, use positions that put less stress on your back: Bend your knees. Keep the load close to your body. Avoid twisting. Exercise regularly as told by your health care provider. Exercising helps your back heal faster and helps prevent back injuries by keeping muscles strong and flexible. Work with a physical therapist to make a safe exercise program, as recommended by your health care provider. Do any exercises as told by your physical therapist. Lifestyle Maintain a healthy weight. Extra weight puts stress on your back and makes it difficult to have good   posture. Avoid activities or situations that make you feel anxious or stressed. Stress and anxiety increase muscle tension and can make back pain worse. Learn ways to manage anxiety and stress, such as through exercise. General instructions Sleep on a firm mattress in a comfortable position. Try lying on your side with your knees slightly bent. If you lie on your back, put a pillow under your knees. Keep your head and neck in a straight line with your spine (neutral position) when using electronic equipment like  smartphones or pads. To do this: Raise your smartphone or pad to look at it instead of bending your head or neck to look down. Put the smartphone or pad at the level of your face while looking at the screen. Follow your treatment plan as told by your health care provider. This may include: Cognitive or behavioral therapy. Acupuncture or massage therapy. Meditation or yoga. Contact a health care provider if: You have pain that is not relieved with rest or medicine. You have increasing pain going down into your legs or buttocks. Your pain does not improve after 2 weeks. You have pain at night. You lose weight without trying. You have a fever or chills. You develop nausea or vomiting. You develop abdominal pain. Get help right away if: You develop new bowel or bladder control problems. You have unusual weakness or numbness in your arms or legs. You feel faint. These symptoms may represent a serious problem that is an emergency. Do not wait to see if the symptoms will go away. Get medical help right away. Call your local emergency services (911 in the [place] Do not drive yourself to the hospital. Summary Acute back pain is sudden and usually short-lived. Use proper lifting techniques. When you bend and lift, use positions that put less stress on your back. Take over-the-counter and prescription medicines only as told by your health care provider, and apply heat or ice as told. This information is not intended to replace advice given to you by your health care provider. Make sure you discuss any questions you have with your health care provider. Document Revised: 07/07/2020 Document Reviewed: 07/07/2020 Elsevier Patient Education  2024 [employer]  

## 2022-10-30 NOTE — Assessment & Plan Note (Signed)
Scheduled for thoracic spine MRI in 2 weeks Need to optimize pain management Recommend Tylenol for mild to moderate pain Start Flexeril 10 mg 3 times a day as needed Take Norco for moderate to severe pain
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
## 2022-11-04 ENCOUNTER — Inpatient Hospital Stay: Payer: Medicare Other | Attending: Hematology and Oncology | Admitting: Hematology and Oncology

## 2022-11-04 ENCOUNTER — Other Ambulatory Visit: Payer: Self-pay

## 2022-11-04 VITALS — BP 145/55 | HR 94 | Temp 97.2°F | Resp 18 | Ht 61.0 in | Wt 214.2 lb

## 2022-11-04 DIAGNOSIS — Z923 Personal history of irradiation: Secondary | ICD-10-CM | POA: Diagnosis not present

## 2022-11-04 DIAGNOSIS — Z7982 Long term (current) use of aspirin: Secondary | ICD-10-CM | POA: Insufficient documentation

## 2022-11-04 DIAGNOSIS — E119 Type 2 diabetes mellitus without complications: Secondary | ICD-10-CM | POA: Insufficient documentation

## 2022-11-04 DIAGNOSIS — Z79811 Long term (current) use of aromatase inhibitors: Secondary | ICD-10-CM | POA: Diagnosis not present

## 2022-11-04 DIAGNOSIS — Z170 Estrogen receptor positive status [ER+]: Secondary | ICD-10-CM | POA: Diagnosis not present

## 2022-11-04 DIAGNOSIS — Z7951 Long term (current) use of inhaled steroids: Secondary | ICD-10-CM | POA: Diagnosis not present

## 2022-11-04 DIAGNOSIS — C50411 Malignant neoplasm of upper-outer quadrant of right female breast: Secondary | ICD-10-CM | POA: Diagnosis not present

## 2022-11-04 DIAGNOSIS — Z79899 Other long term (current) drug therapy: Secondary | ICD-10-CM | POA: Diagnosis not present

## 2022-11-04 DIAGNOSIS — Z7984 Long term (current) use of oral hypoglycemic drugs: Secondary | ICD-10-CM | POA: Diagnosis not present

## 2022-11-04 NOTE — Assessment & Plan Note (Addendum)
Right lumpectomy 10/16/2068: IDC grade 2, 1.2 cm, intermediate grade DCIS, margins negative although less than 0.1 cm to medial margin and superior margin, 0/1 lymph node negative, ER 100%, PR 90%, HER-2 negative ratio 1.37, T1 cN0 stage IA Oncotype DX score 15: Risk of recurrence 9% Adjuvant radiation: 11/27/2015- 01/15/2016   Current treatment: Anastrozole 1 mg daily started 02/08/2016- 10/17/20 (stopped after 5 years due to musculoskeletal AEs)   Diabetes: on metformin Recent fall with compression fractures in the back: Extensive CT scans/MRIs did not reveal any metastatic disease.:  Currently on gabapentin for pain relief  Breast cancer surveillance:  1.  Breast exam 11/05/2022: Benign, scar tissue from prior surgeries. 2. Mammogram 10/30/2022: Breast density category B     Return to clinic on an as-needed basis

## 2022-11-07 ENCOUNTER — Ambulatory Visit
Admission: RE | Admit: 2022-11-07 | Discharge: 2022-11-07 | Disposition: A | Discharge status: Home / Self Care | Payer: Medicare Other | Source: Ambulatory Visit | Attending: Neurosurgery | Admitting: Neurosurgery

## 2022-11-07 DIAGNOSIS — S22060A Wedge compression fracture of T7-T8 vertebra, initial encounter for closed fracture: Secondary | ICD-10-CM | POA: Diagnosis not present

## 2022-11-07 DIAGNOSIS — M5184 Other intervertebral disc disorders, thoracic region: Secondary | ICD-10-CM | POA: Diagnosis not present

## 2022-11-07 DIAGNOSIS — S22080A Wedge compression fracture of T11-T12 vertebra, initial encounter for closed fracture: Secondary | ICD-10-CM

## 2022-11-07 DIAGNOSIS — Z6839 Body mass index (BMI) 39.0-39.9, adult: Secondary | ICD-10-CM | POA: Diagnosis not present

## 2022-11-19 DIAGNOSIS — S22080A Wedge compression fracture of T11-T12 vertebra, initial encounter for closed fracture: Secondary | ICD-10-CM | POA: Diagnosis not present

## 2022-11-20 ENCOUNTER — Other Ambulatory Visit: Payer: Self-pay | Admitting: Emergency Medicine

## 2022-11-20 ENCOUNTER — Other Ambulatory Visit (HOSPITAL_BASED_OUTPATIENT_CLINIC_OR_DEPARTMENT_OTHER): Payer: Self-pay | Admitting: Cardiovascular Disease

## 2022-11-20 ENCOUNTER — Other Ambulatory Visit: Payer: Self-pay | Admitting: Internal Medicine

## 2022-11-20 DIAGNOSIS — M549 Dorsalgia, unspecified: Secondary | ICD-10-CM

## 2022-11-22 ENCOUNTER — Ambulatory Visit (INDEPENDENT_AMBULATORY_CARE_PROVIDER_SITE_OTHER)
Admission: RE | Admit: 2022-11-22 | Discharge: 2022-11-22 | Disposition: A | Discharge status: Home / Self Care | Payer: Medicare Other | Source: Ambulatory Visit | Attending: Internal Medicine | Admitting: Internal Medicine

## 2022-11-22 DIAGNOSIS — E2839 Other primary ovarian failure: Secondary | ICD-10-CM

## 2022-12-02 ENCOUNTER — Ambulatory Visit (INDEPENDENT_AMBULATORY_CARE_PROVIDER_SITE_OTHER): Payer: Medicare Other | Admitting: Family Medicine

## 2022-12-02 ENCOUNTER — Encounter: Payer: Self-pay | Admitting: Family Medicine

## 2022-12-02 VITALS — BP 138/80 | HR 84 | Temp 98.1°F | Resp 20 | Ht 61.0 in | Wt 211.0 lb

## 2022-12-02 DIAGNOSIS — J0140 Acute pansinusitis, unspecified: Secondary | ICD-10-CM | POA: Diagnosis not present

## 2022-12-02 MED ORDER — DOXYCYCLINE HYCLATE 100 MG PO TABS
100.0000 mg | ORAL_TABLET | Freq: Two times a day (BID) | ORAL | 0 refills | Status: AC
Start: 2022-12-02 — End: 2022-12-09

## 2022-12-02 NOTE — Progress Notes (Signed)
Assessment & Plan:  1. Acute non-recurrent pansinusitis Education provided on sinusitis.  Encouraged to continue using inhalers as prescribed. - doxycycline (VIBRA-TABS) 100 MG tablet; Take 1 tablet (100 mg total) by mouth 2 (two) times daily for 7 days.  Dispense: 14 tablet; Refill: 0   Follow up plan: Return if symptoms worsen or fail to improve.  [name], MSN, APRN, FNP-C  Subjective:  HPI: Denise [name] is a 77 y.o. female presenting on 12/02/2022 for Cough and COPD (X about 4 weeks - hoarseness throughout that time as well. Denise [name] of asthma )  Patient reports she had a cold that started 1 month ago.  She tested herself at home for COVID and was negative.  She reports she continues to have a cough and is hoarse.  She is using her Dulera twice daily as prescribed as well as albuterol nebulizer treatments.  States she does not feel she ever really got over her cold.   ROS: Negative unless specifically indicated above in HPI.   Relevant past medical history reviewed and updated as indicated.   Allergies and medications reviewed and updated.   Current Outpatient Medications:    albuterol (PROVENTIL) (2.5 MG/3ML) 0.083% nebulizer solution, Take 3 mLs (2.5 mg total) by nebulization every 6 (six) hours as needed for wheezing or shortness of breath., Disp: 75 mL, Rfl: 0   albuterol (VENTOLIN HFA) 108 (90 Base) MCG/ACT inhaler, INHALE 2 PUFFS BY MOUTH EVERY 6 HOURS AS NEEDED FOR WHEEZING OR  SHORTNESS  OF  BREATH, Disp: 9 g, Rfl: 2   amLODipine (NORVASC) 5 MG tablet, Take 1 tablet by mouth once daily, Disp: 90 tablet, Rfl: 1   aspirin 81 MG EC tablet, Take 81 mg by mouth daily., Disp: , Rfl:    atorvastatin (LIPITOR) 40 MG tablet, Take 1 tablet by mouth once daily, Disp: 90 tablet, Rfl: 3   Calcium Carb-Cholecalciferol (CALCIUM 600 + D PO), Take 1 tablet by mouth daily., Disp: , Rfl:    cetirizine (EQ ALLERGY RELIEF, CETIRIZINE,) 10 MG tablet, TAKE 1 TABLET BY MOUTH ONCE DAILY  AS NEEDED FOR  ALLERGIES, Disp: 90 tablet, Rfl: 3   cyclobenzaprine (FLEXERIL) 10 MG tablet, Take 1 tablet by mouth three times daily as needed for muscle spasm, Disp: 30 tablet, Rfl: 0   diclofenac Sodium (VOLTAREN) 1 % GEL, Apply 4 g topically 4 (four) times daily., Disp: 100 g, Rfl: 0   famotidine (PEPCID) 40 MG tablet, Take 1 tablet (40 mg total) by mouth at bedtime., Disp: 90 tablet, Rfl: 3   fluticasone (FLONASE) 50 MCG/ACT nasal spray, Place 2 sprays into both nostrils daily., Disp: 16 g, Rfl: 3   furosemide (LASIX) 40 MG tablet, Take 1 tablet by mouth twice daily, Disp: 180 tablet, Rfl: 0   gabapentin (NEURONTIN) 100 MG capsule, Take 1 capsule (100 mg total) by mouth 3 (three) times daily., Disp: 90 capsule, Rfl: 3   metFORMIN (GLUCOPHAGE) 500 MG tablet, TAKE 2 TABLETS BY MOUTH TWICE DAILY WITH A MEAL, Disp: 360 tablet, Rfl: 1   mometasone-formoterol (DULERA) 200-5 MCG/ACT AERO, Inhale 2 puffs into the lungs 2 (two) times daily., Disp: 13 g, Rfl: 11   Multiple Vitamins-Minerals (ALIVE WOMENS 50+) TABS, Take 1 tablet by mouth daily., Disp: , Rfl:    Omega-3 Fatty Acids (FISH OIL) 1000 MG CAPS, Take 1,000 mg by mouth daily., Disp: , Rfl:    pantoprazole (PROTONIX) 40 MG tablet, Take 1 tablet (40 mg total) by mouth 2 (two) times  daily before a meal., Disp: 180 tablet, Rfl: 3   Polyvinyl Alcohol-Povidone (REFRESH OP), Place 1 drop into both eyes daily as needed (dry eyes)., Disp: , Rfl:    potassium chloride (MICRO-K) 10 MEQ CR capsule, TAKE 4 CAPSULES BY MOUTH ONCE DAILY., Disp: 360 capsule, Rfl: 3   valsartan (DIOVAN) 40 MG tablet, Take 1 tablet (40 mg total) by mouth every evening., Disp: 90 tablet, Rfl: 3   valsartan (DIOVAN) 80 MG tablet, Take 1 tablet (80 mg total) by mouth every morning., Disp: 90 tablet, Rfl: 3  Allergies  Allergen Reactions   Doxazosin Nausea And Vomiting    Dizziness    Clonidine Hydrochloride Other (See Comments)    Bradycardia   Erythromycin Palpitations    Hydrocodone-Acetaminophen Nausea Only   Pork-Derived Products Diarrhea and Nausea Only   Rosiglitazone Maleate Swelling    SWELLING REACTION UNSPECIFIED     Objective:   BP 138/80   Pulse 84   Temp 98.1 F (36.7 C)   Resp 20   Ht 5\' 1"  (1.549 m)   Wt 211 lb (95.7 kg)   BMI 39.87 kg/m    Physical Exam Vitals reviewed.  Constitutional:      General: She is not in acute distress.    Appearance: Normal appearance. She is not ill-appearing, toxic-appearing or diaphoretic.  HENT:     Head: Normocephalic and atraumatic.     Right Ear: Tympanic membrane, ear canal and external ear normal. There is no impacted cerumen.     Left Ear: Tympanic membrane, ear canal and external ear normal. There is no impacted cerumen.     Nose:     Right Sinus: Maxillary sinus tenderness and frontal sinus tenderness present.     Left Sinus: Maxillary sinus tenderness and frontal sinus tenderness present.     Mouth/Throat:     Mouth: Mucous membranes are moist.     Pharynx: Oropharynx is clear. Posterior oropharyngeal erythema present. No oropharyngeal exudate.  Eyes:     General: No scleral icterus.       Right eye: No discharge.        Left eye: No discharge.     Conjunctiva/sclera: Conjunctivae normal.  Cardiovascular:     Rate and Rhythm: Normal rate and regular rhythm.     Heart sounds: Normal heart sounds. No murmur heard.    No friction rub. No gallop.  Pulmonary:     Effort: Pulmonary effort is normal. No respiratory distress.     Breath sounds: Normal breath sounds. No stridor. No wheezing, rhonchi or rales.  Musculoskeletal:        General: Normal range of motion.     Cervical back: Normal range of motion.  Lymphadenopathy:     Cervical: No cervical adenopathy.  Skin:    General: Skin is warm and dry.     Capillary Refill: Capillary refill takes less than 2 seconds.  Neurological:     General: No focal deficit present.     Mental Status: She is alert and oriented to person, place,  and time. Mental status is at baseline.  Psychiatric:        Mood and Affect: Mood normal.        Behavior: Behavior normal.        Thought Content: Thought content normal.        Judgment: Judgment normal.

## 2022-12-04 ENCOUNTER — Encounter: Payer: Self-pay | Admitting: Family Medicine

## 2022-12-04 MED ORDER — AMOXICILLIN-POT CLAVULANATE 875-125 MG PO TABS: 1.0000 | ORAL_TABLET | Freq: Two times a day (BID) | ORAL | 0 refills | Status: DC

## 2022-12-04 NOTE — Telephone Encounter (Signed)
Pt saw [place] on Monday.Marland KitchenRaechel Chute

## 2022-12-10 DIAGNOSIS — S22080A Wedge compression fracture of T11-T12 vertebra, initial encounter for closed fracture: Secondary | ICD-10-CM | POA: Diagnosis not present

## 2022-12-12 ENCOUNTER — Encounter: Payer: Self-pay | Admitting: Emergency Medicine

## 2022-12-13 ENCOUNTER — Telehealth: Payer: Self-pay

## 2022-12-13 NOTE — Telephone Encounter (Signed)
Called to confirm participation in next PREP class at [facility] on August 27; left voicemail requesting return call

## 2022-12-13 NOTE — Telephone Encounter (Signed)
She returned my call, wants to restart PREP on the 27th; will plan to stay after class that day to do any needed paperwork.
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
## 2022-12-31 NOTE — Progress Notes (Signed)
[facility] PREP Weekly Session  Patient Details  Name: Abbeygail Teichmann MRN: [id number] Date of Birth: 11-Sep-1945 Age: 77 y.o. PCP: Corwin Levins, MD  Vitals:   12/31/22 1322  Weight: 213 lb (96.6 kg)     [facility] Weekly seesion - 12/31/22 1300       [facility] "PREP" Location   [facility] "PREP" Location [facility]      Weekly Session   Topic Discussed Importance of resistance training;Other ways to be active   Goal: work up to 150 minutes cardio/wk; strength training: 2-3 times/wk for 20-40 minutes.   Classes attended to date 3             Lasaundra Riche B Maryann Mccall 12/31/2022, 1:23 PM

## 2023-01-07 NOTE — Progress Notes (Signed)
[facility] PREP Weekly Session  Patient Details  Name: Denise [name] MRN: [id number] Date of Birth: 04-07-1946 Age: 77 y.o. PCP: Corwin Levins, MD  Vitals:   01/07/23 1327  Weight: 210 lb (95.3 kg)     [facility] Weekly seesion - 01/07/23 1300       [facility] "PREP" Location   [facility] "PREP" Location [facility]      Weekly Session   Topic Discussed Healthy eating tips   Foods to reduce, foods to increase; introduced YUKA app   Minutes exercised this week 365 minutes    Classes attended to date 4             Denise [name] B Callahan Peddie 01/07/2023, 1:28 PM

## 2023-01-08 ENCOUNTER — Encounter: Payer: Self-pay | Admitting: Primary Care

## 2023-01-08 ENCOUNTER — Ambulatory Visit (INDEPENDENT_AMBULATORY_CARE_PROVIDER_SITE_OTHER): Payer: Medicare Other | Admitting: Primary Care

## 2023-01-08 VITALS — BP 144/92 | HR 82 | Ht 61.0 in | Wt 208.4 lb

## 2023-01-08 DIAGNOSIS — G4733 Obstructive sleep apnea (adult) (pediatric): Secondary | ICD-10-CM

## 2023-01-08 DIAGNOSIS — R918 Other nonspecific abnormal finding of lung field: Secondary | ICD-10-CM

## 2023-01-08 NOTE — Assessment & Plan Note (Signed)
-   History of mild to moderate obstructive sleep apnea.  Patient has not used CPAP since May 2024 to back injury.  Encourage patient resume nightly CPAP use.  Advise she look into getting either wedge pillow or CPAP pillow for side sleepers.  Follow-up in 6 months or sooner if needed.

## 2023-01-08 NOTE — Assessment & Plan Note (Addendum)
-   Stable; Not acutely exacerbated.  No recent flareups.  - Continue Dulera 2 puffs twice daily

## 2023-01-08 NOTE — Assessment & Plan Note (Signed)
-   Left upper lobe groundglass pulmonary nodule resolved.  She had atelectasis on most recent CTA imaging.  Encourage deep breathing exercises several times a day.  No dedicated follow-up needed.

## 2023-01-08 NOTE — Progress Notes (Signed)
@Patient  ID: Denise [name], female    DOB: August 01, 1945, 77 y.o.   MRN: [id number]  Chief Complaint  Patient presents with   Follow-up    No concerns, asthma has been okay.     Referring provider: Corwin Levins, MD  HPI: 77 year old female, former smoker.  Past medical history significant for hypertension, severe aortic stenosis status post TAVR, coronary artery calcification, asthma, OSA, abnormal CT chest, GERD, dysphagia, type 2 diabetes, thyroid mass, thoracic spine compression fracture, stenosis, breast cancer, obesity.  Patient of Dr. Delton Coombes, last seen in office on 01/17/2022.  Previous [facility] pulmonary encounter: ROV 10/12/20 --77 year old woman with a minimal tobacco history, history of breast cancer.  Also diabetes, hyperlipidemia, eczema, hypertension, aortic stenosis with TAVR, asthma on Symbicort.  She had a CT chest performed in December that showed some subtle peribronchovascular left upper lobe groundglass without an overt nodule and some stable left upper lobe linear atelectasis. We repeated her CT chest to follow GGI nodule.  She is having some exertional SOB, no wheeze or cough. Rarely needs albuterol, may need it w URI's. She had a URI since last time, had to be treated for an acute flare. She averages 2x a year.   CT chest 10/09/2020 reviewed by me, shows no mediastinal or hilar adenopathy.  The thyroid is asymmetric with right lobe enlargement and a masslike appearance, partially calcified 4.2 x 3.2 cm.  There are no suspicious appearing nodules or masses in the lungs.  ROV 01/17/2022 --Denise [name] is 45 with a minimal tobacco history, history of breast cancer, diabetes, eczema, hypertension, AAS with TAVR.  We have followed her for asthma managed on Symbicort, resolved left upper lobe groundglass nodule.  Diagnosed with mild obstructive sleep apnea earlier this year.  She was started on AutoSet CPAP 5-15 cmH2O in June.   She has been working to improve her mask fit -  changed to air fit full face mask. She changed to the large and it is working better. Peri Jefferson compliance > 87% for > 4h/night. She has noticed more clarity, better energy, less LE edema. Better BP control as well.  She works at night, goes to sleep in the am.  Symbicort bid, has a URI right now. Albuterol about 2x a day lately.  Flonase to daily recently, added guaifenesin  OSA (obstructive sleep apnea) Very good compliance documented today.  She does have clinical benefit including more mental clarity, better energy, less lower extremity edema, better blood pressure control.  She had been working on getting an optimal mask fit, is using an AirFit fullface mask size large.  We can continue this.   Asthma She has been dealing with a URI but no wheezing on exam today and no clear evidence of bronchospasm or an acute exacerbation of her asthma.  I would hold off on prednisone for now as she is over a week into this illness.  If she develops signs or symptoms of a flare then we will treat.  For now continue Symbicort, albuterol as needed, Mucinex.   Allergic rhinitis Fluticasone nasal spray increased to daily from as needed.  Agree with this.   Abnormal CT scan of lung Left upper lobe groundglass pulmonary nodule resolved.  No dedicated follow-up CTs needed   01/08/2023- Interim hx  Patient presents today for follow-up  She fell on her backside on 09/25/22 She went to ED in June, CTA showed T7, T8 and T12 compression fracture She follows with neurosurgery  She was started on  gabapentin and muscle relaxer, pain has eased up  She stopped wearing her CPAP in June after fall, she is open to re-starting CPAP  Symbicort was changed to [facility] due to insurance coverage Breathing has been alright. She gets out of breath walking. No acute bronchitis symptoms.  She is not as hoarse on Dulera vs symbicort    Allergies  Allergen Reactions   Doxazosin Nausea And Vomiting    Dizziness    Clonidine  Hydrochloride Other (See Comments)    Bradycardia   Erythromycin Palpitations   Hydrocodone-Acetaminophen Nausea Only   Pork-Derived Products Diarrhea and Nausea Only   Rosiglitazone Maleate Swelling    SWELLING REACTION UNSPECIFIED     Immunization History  Administered Date(s) Administered   Fluad Quad(high Dose 65+) 03/04/2019, 04/13/2020, 04/17/2021, 01/18/2022   Influenza,inj,Quad PF,6+ Mos 03/25/2017   Influenza-Unspecified 10/13/2019, 10/17/2021   PFIZER(Purple Top)SARS-COV-2 Vaccination 06/25/2019, 07/22/2019, 03/11/2020, 11/05/2020, 07/11/2021   Pneumococcal Conjugate-13 02/08/2014   Pneumococcal Polysaccharide-23 09/28/2006, 07/29/2012, 02/15/2015   Td 09/12/2008   Tdap 10/20/2018    Past Medical History:  Diagnosis Date   Abdominal pain, left lower quadrant 09/12/2008   ALLERGIC RHINITIS 08/24/2007   Allergy    Anemia    past hx- not current    ANXIETY 08/24/2007   Aortic stenosis, severe 07/19/2016   ASTHMA 08/24/2007   ASTHMA, WITH ACUTE EXACERBATION 03/14/2008   Breast cancer (HCC) 2017   right- radiation only    Cataract    removed bilat    DDD (degenerative disc disease), lumbar    DEGENERATIVE JOINT DISEASE 08/24/2007   DEPRESSION 08/24/2007   DIABETES MELLITUS, TYPE II 08/24/2007   ECZEMA 08/24/2007   Edema 08/24/2007   GERD 08/24/2007   not current (07/2014)- not current 2019   Heart murmur    HYPERCHOLESTEROLEMIA 08/24/2007   HYPERLIPIDEMIA 08/24/2007   HYPERTENSION 08/24/2007   Lower extremity edema 07/13/2021   Neuromuscular disorder (HCC)    numbness in feet - still has feeling   OBESITY 08/24/2007   OSTEOARTHRITIS, KNEES, BILATERAL, 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   Personal history of radiation therapy 2017   right breast ca   POSTMENOPAUSAL STATUS 08/24/2007   Right knee DJD 09/03/2010   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   23 mm Edwards Sapien 3 transcatheter heart valve placed via right percutaneous transfemoral approach    Snoring 07/13/2021   SPINAL STENOSIS 08/24/2007    Tobacco History: Social History   Tobacco Use  Smoking Status Former   Current packs/day: 0.00   Average packs/day: 0.5 packs/day for 2.0 years (1.0 ttl pk-yrs)   Types: Cigarettes   Start date: 78   Quit date: 1971   Years since quitting: 53.7  Smokeless Tobacco Never   Counseling given: Not Answered   Outpatient Medications Prior to Visit  Medication Sig Dispense Refill   albuterol (PROVENTIL) (2.5 MG/3ML) 0.083% nebulizer solution Take 3 mLs (2.5 mg total) by nebulization every 6 (six) hours as needed for wheezing or shortness of breath. 75 mL 0   albuterol (VENTOLIN HFA) 108 (90 Base) MCG/ACT inhaler INHALE 2 PUFFS BY MOUTH EVERY 6 HOURS AS NEEDED FOR WHEEZING OR  SHORTNESS  OF  BREATH 9 g 2   amLODipine (NORVASC) 5 MG tablet Take 1 tablet by mouth once daily 90 tablet 1   aspirin 81 MG EC tablet Take 81 mg by mouth daily.     atorvastatin (LIPITOR) 40 MG tablet Take 1 tablet by mouth once daily 90 tablet 3   Calcium Carb-Cholecalciferol (  CALCIUM 600 + D PO) Take 1 tablet by mouth daily.     cetirizine (EQ ALLERGY RELIEF, CETIRIZINE,) 10 MG tablet TAKE 1 TABLET BY MOUTH ONCE DAILY AS NEEDED FOR  ALLERGIES 90 tablet 3   cyclobenzaprine (FLEXERIL) 10 MG tablet Take 1 tablet by mouth three times daily as needed for muscle spasm 30 tablet 0   diclofenac Sodium (VOLTAREN) 1 % GEL Apply 4 g topically 4 (four) times daily. 100 g 0   famotidine (PEPCID) 40 MG tablet Take 1 tablet (40 mg total) by mouth at bedtime. 90 tablet 3   fluticasone (FLONASE) 50 MCG/ACT nasal spray Place 2 sprays into both nostrils daily. 16 g 3   furosemide (LASIX) 40 MG tablet Take 1 tablet by mouth twice daily 180 tablet 0   gabapentin (NEURONTIN) 100 MG capsule Take 1 capsule (100 mg total) by mouth 3 (three) times daily. 90 capsule 3   metFORMIN (GLUCOPHAGE) 500 MG tablet TAKE 2 TABLETS BY MOUTH TWICE DAILY WITH A MEAL 360 tablet 1    mometasone-formoterol (DULERA) 200-5 MCG/ACT AERO Inhale 2 puffs into the lungs 2 (two) times daily. 13 g 11   Multiple Vitamins-Minerals (ALIVE WOMENS 50+) TABS Take 1 tablet by mouth daily.     Omega-3 Fatty Acids (FISH OIL) 1000 MG CAPS Take 1,000 mg by mouth daily.     pantoprazole (PROTONIX) 40 MG tablet Take 1 tablet (40 mg total) by mouth 2 (two) times daily before a meal. 180 tablet 3   Polyvinyl Alcohol-Povidone (REFRESH OP) Place 1 drop into both eyes daily as needed (dry eyes).     potassium chloride (MICRO-K) 10 MEQ CR capsule TAKE 4 CAPSULES BY MOUTH ONCE DAILY. 360 capsule 3   valsartan (DIOVAN) 40 MG tablet Take 1 tablet (40 mg total) by mouth every evening. 90 tablet 3   valsartan (DIOVAN) 80 MG tablet Take 1 tablet (80 mg total) by mouth every morning. 90 tablet 3   amoxicillin-clavulanate (AUGMENTIN) 875-125 MG tablet Take 1 tablet by mouth 2 (two) times daily. 20 tablet 0   No facility-administered medications prior to visit.   Review of Systems  Review of Systems  Constitutional: Negative.   HENT: Negative.    Respiratory:  Negative for cough and wheezing.   Musculoskeletal:  Positive for back pain.    Physical Exam  BP (!) 144/92   Pulse 82   Ht 5\' 1"  (1.549 m)   Wt 208 lb 6.4 oz (94.5 kg)   SpO2 99%   BMI 39.38 kg/m  Physical Exam Constitutional:      General: She is not in acute distress.    Appearance: Normal appearance. She is not ill-appearing.  HENT:     Head: Normocephalic and atraumatic.     Mouth/Throat:     Mouth: Mucous membranes are moist.     Pharynx: Oropharynx is clear.  Cardiovascular:     Rate and Rhythm: Normal rate and regular rhythm.  Pulmonary:     Effort: Pulmonary effort is normal.     Breath sounds: Normal breath sounds. No wheezing, rhonchi or rales.     Comments: CTA Musculoskeletal:        General: Normal range of motion.  Skin:    General: Skin is warm and dry.  Neurological:     General: No focal deficit present.      Mental Status: She is alert and oriented to person, place, and time. Mental status is at baseline.  Psychiatric:  Mood and Affect: Mood normal.        Behavior: Behavior normal.        Thought Content: Thought content normal.        Judgment: Judgment normal.      Lab Results:  CBC    Component Value Date/Time   WBC 8.7 10/13/2022 0313   RBC 4.62 10/13/2022 0313   HGB 12.1 10/13/2022 0313   HGB 13.5 10/09/2021 1039   HGB 13.8 09/27/2015 0840   HCT 37.7 10/13/2022 0313   HCT 41.3 10/09/2021 1039   HCT 42.7 09/27/2015 0840   PLT 238 10/13/2022 0313   PLT 221 10/09/2021 1039   MCV 81.6 10/13/2022 0313   MCV 80 10/09/2021 1039   MCV 82.9 09/27/2015 0840   MCH 26.2 10/13/2022 0313   MCHC 32.1 10/13/2022 0313   RDW 16.3 (H) 10/13/2022 0313   RDW 15.5 (H) 10/09/2021 1039   RDW 15.3 (H) 09/27/2015 0840   LYMPHSABS 2.8 10/13/2022 0313   LYMPHSABS 3.5 (H) 09/27/2015 0840   MONOABS 0.8 10/13/2022 0313   MONOABS 0.6 09/27/2015 0840   EOSABS 0.2 10/13/2022 0313   EOSABS 0.3 09/27/2015 0840   BASOSABS 0.1 10/13/2022 0313   BASOSABS 0.1 09/27/2015 0840    BMET    Component Value Date/Time   NA 140 10/13/2022 0313   NA 145 (H) 03/01/2022 1055   NA 145 09/27/2015 0840   K 3.2 (L) 10/13/2022 0313   K 3.7 09/27/2015 0840   CL 103 10/13/2022 0313   CO2 25 10/13/2022 0313   CO2 27 09/27/2015 0840   GLUCOSE 130 (H) 10/13/2022 0313   GLUCOSE 136 09/27/2015 0840   BUN 9 10/13/2022 0313   BUN 11 03/01/2022 1055   BUN 8.8 09/27/2015 0840   CREATININE 0.91 10/13/2022 0313   CREATININE 0.8 09/27/2015 0840   CALCIUM 8.7 (L) 10/13/2022 0313   CALCIUM 9.2 09/27/2015 0840   GFRNONAA >60 10/13/2022 0313   GFRAA 95 03/07/2020 0843    BNP    Component Value Date/Time   BNP 82.0 09/23/2022 0745    ProBNP    Component Value Date/Time   PROBNP 22.0 01/06/2012 1706    Imaging: No results found.   Assessment & Plan:   OSA (obstructive sleep apnea) - History of mild  to moderate obstructive sleep apnea.  Patient has not used CPAP since May 2024 to back injury.  Encourage patient resume nightly CPAP use.  Advise she look into getting either wedge pillow or CPAP pillow for side sleepers.  Follow-up in 6 months or sooner if needed.  Asthma - Stable; Not acutely exacerbated.  No recent flareups.  - Continue Dulera 2 puffs twice daily   Abnormal CT scan of lung - Left upper lobe groundglass pulmonary nodule resolved.  She had atelectasis on most recent CTA imaging.  Encourage deep breathing exercises several times a day.  No dedicated follow-up needed.   Glenford Bayley, NP 01/08/2023

## 2023-01-08 NOTE — Patient Instructions (Addendum)
Recommendations: Continue Dulera two puffs morning and evening Encourage deep breathing exercises several times a day  Resume CPAP use nightly  Look at getting CPAP pillow for side sleepers online or amazon  Follow-up 6 months with Dr. Delton Coombes

## 2023-01-13 DIAGNOSIS — M79641 Pain in right hand: Secondary | ICD-10-CM | POA: Insufficient documentation

## 2023-01-14 NOTE — Progress Notes (Signed)
[facility] PRE[facility] Weekly Session  Patient Details  Name: Denise [name] MRN: [id number] Date of Birth: Mar 18, 1946 Age: 77 y.o. PCP: Corwin Levins, MD  Vitals:   01/14/23 1331  Weight: 209 lb (94.8 kg)     [facility] Weekly seesion - 01/14/23 1300       [facility] "PREP" Location   [facility]PREP" Location [facility]      Weekly Session   Topic Discussed Health habits   Tips for maintaining healthly lifestyle; sugar demo; encouraged to limit added sugars to 24 gms/day   Minutes exercised this week 420 minutes    Classes attended to date 6             Emeline Simpson B Marit Goodwill 01/14/2023, 1:32 PM

## 2023-01-15 DIAGNOSIS — M65341 Trigger finger, right ring finger: Secondary | ICD-10-CM | POA: Diagnosis not present

## 2023-01-15 DIAGNOSIS — M1812 Unilateral primary osteoarthritis of first carpometacarpal joint, left hand: Secondary | ICD-10-CM | POA: Diagnosis not present

## 2023-01-21 NOTE — Progress Notes (Signed)
[facility] PREP Weekly Session  Patient Details  Name: Denise [name] MRN: [id number] Date of Birth: 1946/04/03 Age: 77 y.o. PCP: Corwin Levins, MD  Vitals:   01/21/23 1313  Weight: 208 lb (94.3 kg)     [facility] Weekly seesion - 01/21/23 1300       [facility] "PREP" Location   [facility]PREP[facility] Location [facility]      Weekly Session   Topic Discussed Restaurant Eating   Salt demo; limit sodium intake to 1500-2300 mg/day   Minutes exercised this week 340 minutes    Classes attended to date 8             Krissia Schreier B Natika Geyer 01/21/2023, 1:14 PM

## 2023-01-23 DIAGNOSIS — S22060A Wedge compression fracture of T7-T8 vertebra, initial encounter for closed fracture: Secondary | ICD-10-CM | POA: Diagnosis not present

## 2023-01-23 DIAGNOSIS — S22080A Wedge compression fracture of T11-T12 vertebra, initial encounter for closed fracture: Secondary | ICD-10-CM | POA: Diagnosis not present

## 2023-01-28 NOTE — Progress Notes (Signed)
[facility] PREP Weekly Session  Patient Details  Name: Denise [name] MRN: [id number] Date of Birth: [birth year] Age: 77 y.o. PCP: Corwin Levins, MD  Vitals:   01/28/23 1325  Weight: 208 lb (94.3 kg)     [facility] Weekly seesion - 01/28/23 1300       [facility] "PREP" Location   [facility] "PREP[facility] Location [facility]      Weekly Session   Topic Discussed Stress management and problem solving   Importance of sleep; introduced finger tip mudra breathwork   Minutes exercised this week 340 minutes    Classes attended to date 78             Clare Fennimore B Lisbet Busker 01/28/2023, 1:26 PM

## 2023-02-04 NOTE — Progress Notes (Signed)
[facility] PREP Weekly Session  Patient Details  Name: Denise [name] MRN: [id number] Date of Birth: Oct 16, 1945 Age: 77 y.o. PCP: Corwin Levins, MD  Vitals:   02/04/23 1305  Weight: 207 lb (93.9 kg)     [facility] Weekly seesion - 02/04/23 1300       [facility] "PREP" Location   [facility] "PREP[facility] Location [facility]      Weekly Session   Topic Discussed Goal setting and welcome to the program   Halfway through program, revisit/re-state/review goals; Staying positive   Minutes exercised this week 340 minutes    Classes attended to date 76             Algie Westry B Dermot Gremillion 02/04/2023, 1:06 PM

## 2023-02-11 NOTE — Progress Notes (Signed)
[facility] PREP Weekly Session  Patient Details  Name: Denise [name] MRN: [id number] Date of Birth: [birth year] Age: 77 y.o. PCP: Corwin Levins, MD  Vitals:   02/11/23 1300  Weight: 206 lb (93.4 kg)     [facility] Weekly seesion - 02/11/23 1300       [facility] "PREP" Location   [facility] "PREP[facility] Location [facility]      Weekly Session   Topic Discussed Other   Portion control, visualize your portion size demo; review of [employer] nutrition label.   Minutes exercised this week 300 minutes    Classes attended to date 53             Denise [name] B Krystal Teachey 02/11/2023, 1:02 PM

## 2023-02-12 ENCOUNTER — Encounter (HOSPITAL_BASED_OUTPATIENT_CLINIC_OR_DEPARTMENT_OTHER): Payer: Self-pay | Admitting: Cardiovascular Disease

## 2023-02-17 DIAGNOSIS — M65341 Trigger finger, right ring finger: Secondary | ICD-10-CM | POA: Diagnosis not present

## 2023-02-18 NOTE — Progress Notes (Signed)
[facility] PREP Weekly Session  Patient Details  Name: Denise [name] MRN: [id number] Date of Birth: 1945/12/12 Age: 77 y.o. PCP: Corwin Levins, MD  Vitals:   02/18/23 1256  Weight: 206 lb (93.4 kg)     [facility] Weekly seesion - 02/18/23 1200       [facility] "PREP" Location   [facility]PREP[facility] Location [facility]      Weekly Session   Topic Discussed Finding support   Shared information about PREP Emmi programming once this class ends   Minutes exercised this week 175 minutes    Classes attended to date 52             Chardonnay Holzmann B Vear Staton 02/18/2023, 12:57 PM

## 2023-02-21 ENCOUNTER — Other Ambulatory Visit: Payer: Self-pay

## 2023-02-21 ENCOUNTER — Other Ambulatory Visit: Payer: Self-pay | Admitting: Internal Medicine

## 2023-02-25 ENCOUNTER — Ambulatory Visit: Payer: Medicare Other | Admitting: Podiatry

## 2023-02-25 ENCOUNTER — Encounter: Payer: Self-pay | Admitting: Podiatry

## 2023-02-25 VITALS — Ht 61.0 in | Wt 206.0 lb

## 2023-02-25 DIAGNOSIS — E1142 Type 2 diabetes mellitus with diabetic polyneuropathy: Secondary | ICD-10-CM | POA: Diagnosis not present

## 2023-02-25 DIAGNOSIS — M79674 Pain in right toe(s): Secondary | ICD-10-CM

## 2023-02-25 DIAGNOSIS — L84 Corns and callosities: Secondary | ICD-10-CM | POA: Diagnosis not present

## 2023-02-25 DIAGNOSIS — M79675 Pain in left toe(s): Secondary | ICD-10-CM | POA: Diagnosis not present

## 2023-02-25 DIAGNOSIS — B351 Tinea unguium: Secondary | ICD-10-CM

## 2023-02-25 NOTE — Progress Notes (Signed)
Sent me a message stating she was withdrawing from PREP as she would be working for [employer]; education sessions completed: 9 Workout/strength training sessions completed: 5 This was her 2nd attempt at taking PREP.

## 2023-03-03 DIAGNOSIS — M79641 Pain in right hand: Secondary | ICD-10-CM | POA: Diagnosis not present

## 2023-03-03 DIAGNOSIS — M79642 Pain in left hand: Secondary | ICD-10-CM | POA: Diagnosis not present

## 2023-03-07 ENCOUNTER — Other Ambulatory Visit: Payer: Self-pay | Admitting: Gastroenterology

## 2023-03-10 DIAGNOSIS — M79641 Pain in right hand: Secondary | ICD-10-CM | POA: Diagnosis not present

## 2023-03-10 DIAGNOSIS — M79642 Pain in left hand: Secondary | ICD-10-CM | POA: Diagnosis not present

## 2023-03-11 ENCOUNTER — Encounter: Payer: Self-pay | Admitting: Internal Medicine

## 2023-03-17 DIAGNOSIS — M79644 Pain in right finger(s): Secondary | ICD-10-CM | POA: Diagnosis not present

## 2023-03-18 ENCOUNTER — Encounter: Payer: Self-pay | Admitting: Internal Medicine

## 2023-03-24 DIAGNOSIS — M79644 Pain in right finger(s): Secondary | ICD-10-CM | POA: Diagnosis not present

## 2023-03-26 ENCOUNTER — Encounter: Payer: Self-pay | Admitting: Internal Medicine

## 2023-03-26 ENCOUNTER — Ambulatory Visit (INDEPENDENT_AMBULATORY_CARE_PROVIDER_SITE_OTHER): Payer: Medicare Other | Admitting: Internal Medicine

## 2023-03-26 VITALS — BP 136/82 | HR 70 | Temp 97.7°F | Ht 61.0 in | Wt 197.0 lb

## 2023-03-26 DIAGNOSIS — I10 Essential (primary) hypertension: Secondary | ICD-10-CM | POA: Diagnosis not present

## 2023-03-26 DIAGNOSIS — R1011 Right upper quadrant pain: Secondary | ICD-10-CM

## 2023-03-26 DIAGNOSIS — R109 Unspecified abdominal pain: Secondary | ICD-10-CM | POA: Diagnosis not present

## 2023-03-26 DIAGNOSIS — E0789 Other specified disorders of thyroid: Secondary | ICD-10-CM | POA: Diagnosis not present

## 2023-03-26 DIAGNOSIS — Z23 Encounter for immunization: Secondary | ICD-10-CM

## 2023-03-26 DIAGNOSIS — R739 Hyperglycemia, unspecified: Secondary | ICD-10-CM

## 2023-03-26 LAB — BASIC METABOLIC PANEL
BUN: 15 mg/dL (ref 6–23)
CO2: 30 meq/L (ref 19–32)
Calcium: 10 mg/dL (ref 8.4–10.5)
Chloride: 105 meq/L (ref 96–112)
Creatinine, Ser: 0.93 mg/dL (ref 0.40–1.20)
GFR: 59.2 mL/min — ABNORMAL LOW (ref >60.00)
Glucose, Bld: 100 mg/dL — ABNORMAL HIGH (ref 70–99)
Potassium: 4.1 meq/L (ref 3.5–5.1)
Sodium: 142 meq/L (ref 135–145)

## 2023-03-26 LAB — HEPATIC FUNCTION PANEL
ALT: 17 U/L (ref 0–35)
AST: 21 U/L (ref 0–37)
Albumin: 4.7 g/dL (ref 3.5–5.2)
Alkaline Phosphatase: 58 U/L (ref 39–117)
Bilirubin, Direct: 0.2 mg/dL (ref 0.0–0.3)
Total Bilirubin: 0.8 mg/dL (ref 0.2–1.2)
Total Protein: 8 g/dL (ref 6.0–8.3)

## 2023-03-26 LAB — HEMOGLOBIN A1C: Hgb A1c MFr Bld: 6 % (ref 4.6–6.5)
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
## 2023-03-26 NOTE — Progress Notes (Signed)
The test results show that your current treatment is OK, as the tests are stable.  Please continue the same plan.  There is no other need for change of treatment or further evaluation based on these results, at this time.  thanks 

## 2023-03-26 NOTE — Patient Instructions (Signed)
Please continue all other medications as before, and refills have been done if requested.  Please have the pharmacy call with any other refills you may need.  Please continue your efforts at being more active, low cholesterol diet, and weight control  Please keep your appointments with your specialists as you may have planned  You will be contacted regarding the referral for: thyroid ultrasound, AND the CT renal stone study  Please go to the LAB at the blood drawing area for the tests to be done  You will be contacted by phone if any changes need to be made immediately.  Otherwise, you will receive a letter about your results with an explanation, but please check with MyChart first.  Please make an Appointment to return in 6 months, or sooner if needed

## 2023-03-27 LAB — THYROID PANEL WITH TSH
Free Thyroxine Index: 2.1 (ref 1.4–3.8)
T3 Uptake: 33 % (ref 22–35)
T4, Total: 6.5 ug/dL (ref 5.1–11.9)
TSH: 0.87 m[IU]/L (ref 0.40–4.50)

## 2023-03-29 ENCOUNTER — Encounter: Payer: Self-pay | Admitting: Internal Medicine

## 2023-03-29 DIAGNOSIS — R109 Unspecified abdominal pain: Secondary | ICD-10-CM | POA: Insufficient documentation

## 2023-03-29 DIAGNOSIS — R739 Hyperglycemia, unspecified: Secondary | ICD-10-CM | POA: Insufficient documentation

## 2023-03-29 NOTE — Assessment & Plan Note (Signed)
?   Thyroid worsening enlarging - for thyroid u/s

## 2023-03-29 NOTE — Assessment & Plan Note (Signed)
BP Readings from Last 3 Encounters:  03/26/23 136/82  01/08/23 (!) 144/92  12/24/22 (!) 150/86   Stable, pt to continue medical treatment norvasc 5 every day, diovan 80 am, 40 mg pm

## 2023-03-29 NOTE — Assessment & Plan Note (Signed)
?   Msk vs other - for CT renal, lab as ordered today

## 2023-03-29 NOTE — Assessment & Plan Note (Signed)
Lab Results  Component Value Date   HGBA1C 6.0 03/26/2023   Stable, pt to continue current medical treatment metformin 500 mg - 2 bid

## 2023-03-31 ENCOUNTER — Ambulatory Visit
Admission: RE | Admit: 2023-03-31 | Discharge: 2023-03-31 | Disposition: A | Discharge status: Home / Self Care | Payer: Medicare Other | Source: Ambulatory Visit | Attending: Internal Medicine | Admitting: Internal Medicine

## 2023-03-31 DIAGNOSIS — R109 Unspecified abdominal pain: Secondary | ICD-10-CM | POA: Diagnosis not present

## 2023-03-31 DIAGNOSIS — E0789 Other specified disorders of thyroid: Secondary | ICD-10-CM

## 2023-03-31 DIAGNOSIS — R1011 Right upper quadrant pain: Secondary | ICD-10-CM

## 2023-03-31 DIAGNOSIS — K5730 Diverticulosis of large intestine without perforation or abscess without bleeding: Secondary | ICD-10-CM | POA: Diagnosis not present

## 2023-03-31 DIAGNOSIS — E049 Nontoxic goiter, unspecified: Secondary | ICD-10-CM | POA: Diagnosis not present

## 2023-03-31 DIAGNOSIS — N281 Cyst of kidney, acquired: Secondary | ICD-10-CM | POA: Diagnosis not present

## 2023-04-01 ENCOUNTER — Other Ambulatory Visit: Payer: Medicare Other

## 2023-04-17 DIAGNOSIS — S22060A Wedge compression fracture of T7-T8 vertebra, initial encounter for closed fracture: Secondary | ICD-10-CM | POA: Diagnosis not present

## 2023-05-21 ENCOUNTER — Other Ambulatory Visit: Payer: Self-pay | Admitting: Internal Medicine

## 2023-05-22 ENCOUNTER — Other Ambulatory Visit: Payer: Self-pay

## 2023-05-22 ENCOUNTER — Encounter: Payer: Self-pay | Admitting: Internal Medicine

## 2023-05-22 NOTE — Telephone Encounter (Signed)
Needs OV please, any provider

## 2023-05-23 ENCOUNTER — Ambulatory Visit (HOSPITAL_BASED_OUTPATIENT_CLINIC_OR_DEPARTMENT_OTHER): Payer: Medicare Other | Admitting: Cardiovascular Disease

## 2023-05-23 VITALS — BP 142/70 | HR 75 | Ht 61.0 in | Wt 199.8 lb

## 2023-05-23 DIAGNOSIS — I10 Essential (primary) hypertension: Secondary | ICD-10-CM | POA: Diagnosis not present

## 2023-05-23 DIAGNOSIS — E785 Hyperlipidemia, unspecified: Secondary | ICD-10-CM | POA: Diagnosis not present

## 2023-05-23 DIAGNOSIS — Z5181 Encounter for therapeutic drug level monitoring: Secondary | ICD-10-CM | POA: Diagnosis not present

## 2023-05-23 MED ORDER — AMLODIPINE BESYLATE 10 MG PO TABS: 10.0000 mg | ORAL_TABLET | Freq: Every day | ORAL | 3 refills | Status: DC

## 2023-05-23 NOTE — Progress Notes (Signed)
Cardiology Office Note:  .   Date:  05/23/2023  ID:  Kadi Hession, DOB 12/08/1945, MRN [id number] PCP: Corwin Levins, MD  [facility] Providers Cardiologist:  Chilton Si, MD    History of Present Illness: .   Shirly Bartosiewicz is a 78 y.o. 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  She was initially seen 07/2016 after her PCP noted a murmur. She was referred for an echo that showed LVEF 55-60% with grade 1 diastolic dysfunction at severe aortic stenosis.  The mean gradient was 48 mmHg.  She had a left heart catheterization 09/04/16 that revealed diffuse calcification and minor non-obstructive coronary artery disease.  She also had carotid Dopplers that revealed 1-39% bilateral ICA stenosis.   She underwent successful placement of a TAVR bioprothesis 09/24/16 with Dr. Excell Seltzer.  Her echo 09/25/16 revealed LVEF 60-65% and a well-functioning aortic valve prosthesis. There is trivial AR and mild MR.  However, she had a follow-up echo 10/17/16 that revealed a significant elevation of the aortic valve gradients (mean gradient 36 mmHg.  Given that the leaflets showed normal mobility, this was thought to be due to patient-prosthesis mismatch given that she has a small aortic root/annulus.  She had a cardiac CT that showed that the aortic valve was functioning well with no evidence of pannus or thrombus. Ms. Cheek had a repeat echo 08/2017 that showed the mean gradient across her TAVR was down to 27 mmHg. She also had carotid Dopplers that revealed 1-39% bilateral ICA stenosis.  Ms. Steffensen's BP was elevated and lisinopril was increased to 40mg  daily.  Doxazosin was added but discontinued due to nausea, vomiting, and dizziness.  Ms. Roger's blood pressure was still elevated.  She wanted to work on diet  and exercise prior to changing her medications.  A plan was made to switch lisinopril to vosartan if it remains elevated at follow-up.     On 01/12/21 she underwent decompressive laminectomy interbody fusion L1-L2. She saw her PCP 04/17/21 and her blood pressure was 158/80. However, she reported lower readings at home. Valsartan was changed to 20mg  bid. She reported edema but her blood pressure was controlled. Amlodipine was reduced, and valsartan was increased. She was referred for a sleep study and was found to have sleep apnea. CPAP was recommended. She had carotid dopplers 09/2021 with stable mild stenosis bilaterally.   At her visit 01/2022 she reported fatigue which she attributed to working [employer].  We discussed increasing exercise and increased valsartan.  She saw Gillian Shields, NP 07/2022 with near syncope.  She was not orthostatic in the office.  Repeat echo 08/2022 revealed LVEF 53% with moderate LVH.  Mean aortic valve gradient was 17 mmHg.  Ms. Creelman presented with ongoing back issues following a fall in May. The fall resulted in compression fractures in the spine, causing significant pain and inflammation in the chest area. The patient reported a decrease in appetite since the fall and has experienced weight loss. Despite the pain, she has been able to maintain mobility with the aid of a cane.  She also reported issues with bowel control since the fall, with instances of incontinence and a change in stool consistency. She has a history of arthritis in the ankles and has recently noticed arthritis in the throat area, which occasionally gives a sensation of choking.  Ms. Fuson has  been managing her hypertension with Valsartan and Amlodipine, but blood pressure readings have been fluctuating between the 130s and 140s. The patient also has a history of asthma, which tends to flare up during a cold.  She has been working extensively to catch up on bills following a period of inability to  work due to the back injury. Despite the physical demands of the job, the patient has been able to manage without significant exacerbation of symptoms.  She participated in [place] but had to discontinue due to the back injury. She expressed a desire to complete the program once her back condition improves.       ROS:  As per HPI  Studies Reviewed: .       Echo 5/8/204: 1. Left ventricular ejection fraction by 3D volume is 53 %. The left  ventricle has low normal function. The left ventricle has no regional wall  motion abnormalities. There is moderate concentric left ventricular  hypertrophy. Left ventricular diastolic  function could not be evaluated.   2. Right ventricular systolic function is normal. The right ventricular  size is normal. Tricuspid regurgitation signal is inadequate for assessing  PA pressure.   3. Left atrial size was mildly dilated.   4. The mitral valve is degenerative. Mild mitral valve regurgitation. No  evidence of mitral stenosis. Moderate mitral annular calcification.   5. The aortic valve has been repaired/replaced. Aortic valve  regurgitation is trivial. No aortic stenosis is present. There is a 23 mm  Sapien prosthetic (TAVR) valve present in the aortic position. Procedure  Date: 09/24/2016.   6. The inferior vena cava is normal in size with greater than 50%  respiratory variability, suggesting right atrial pressure of 3 mmHg.    Risk Assessment/Calculations:     HYPERTENSION CONTROL Vitals:   05/23/23 0831 05/23/23 0918  BP: (!) 146/86 (!) 142/70    The patient's blood pressure is elevated above target today.  In order to address the patient's elevated BP: A current anti-hypertensive medication was adjusted today.      STOP-Bang Score:          Physical Exam:   VS:  BP (!) 142/70   Pulse 75   Ht 5\' 1"  (1.549 m)   Wt 199 lb 12.8 oz (90.6 kg)   SpO2 96%   BMI 37.75 kg/m  , BMI Body mass index is 37.75 kg/m. GENERAL:  Well  appearing HEENT: Pupils equal round and reactive, fundi not visualized, oral mucosa unremarkable NECK:  No jugular venous distention, waveform within normal limits, carotid upstroke brisk and symmetric, no bruits, no thyromegaly LYMPHATICS:  No cervical adenopathy LUNGS:  Clear to auscultation bilaterally HEART:  RRR.  PMI not displaced or sustained,S1 and S2 within normal limits, no S3, no S4, no clicks, no rubs, II/VI systolic murmurs ABD:  Flat, positive bowel sounds normal in frequency in pitch, no bruits, no rebound, no guarding, no midline pulsatile mass, no hepatomegaly, no splenomegaly EXT:  2 plus pulses throughout, no edema, no cyanosis no clubbing SKIN:  No rashes no nodules NEURO:  Cranial nerves II through XII grossly intact, motor grossly intact throughout PSYCH:  Cognitively intact, oriented to person place and time   ASSESSMENT AND PLAN: .    # s/p TAVR:  Valve stable on echo 08/2022.   # Hypertension Blood pressure readings at home fluctuating between 130s and 140s despite current medication regimen (Valsartan 40mg  in the evening and 80mg  in the morning, Amlodipine 5mg ). -Increase Amlodipine to  10mg . -Continue monitoring blood pressure at home.  # Hyperlipidemia # Aortic atherosclerosis: Last cholesterol check was in December 2023. -Order fasting lipids and comprehensive metabolic panel. -LDL goal <70  # Asthma Patient reports shortness of breath, likely due to current cold. -Continue current management plan with Dr. Jonny Ruiz.  # Bowel Incontinence Patient reports occasional incontinence and changes in bowel movements since fall. -Consider discussing with Dr. Remigio Eisenmenger at next follow-up in June.   # Spinal Compression Fractures Healing well per recent follow-up with Dr. Farris Has. Patient reports ongoing back pain and difficulty with mobility. -Recommend patient follow-up on physical therapy referral from Dr. Farris Has.  Follow-up -Check in with Caitlyn in a couple of  months to monitor blood pressure. -Plan for annual visit with provider.      Signed, Chilton Si, MD

## 2023-05-23 NOTE — Patient Instructions (Signed)
Medication Instructions:  INCREASE YOUR AMLODIPINE TO 10 MG DAILY   *If you need a refill on your cardiac medications before your next appointment, please call your pharmacy*  Lab Work: FASTING LP/CMET SOON   If you have labs (blood work) drawn today and your tests are completely normal, you will receive your results only by: MyChart Message (if you have MyChart) OR A paper copy in the mail If you have any lab test that is abnormal or we need to change your treatment, we will call you to review the results.  Testing/Procedures: NONE  Follow-Up: At [facility], you and your health needs are our priority.  As part of our continuing mission to provide you with exceptional heart care, we have created designated Provider Care Teams.  These Care Teams include your primary Cardiologist (physician) and Advanced Practice Providers (APPs -  Physician Assistants and Nurse Practitioners) who all work together to provide you with the care you need, when you need it.  We recommend signing up for the patient portal called "MyChart".  Sign up information is provided on this After Visit Summary.  MyChart is used to connect with patients for Virtual Visits (Telemedicine).  Patients are able to view lab/test results, encounter notes, upcoming appointments, etc.  Non-urgent messages can be sent to your provider as well.   To learn more about what you can do with MyChart, go to [email or web address].    Your next appointment:   12 month(s)  Provider:   Chilton Si, MD or Gillian Shields, NP    PHARM D IN 2 MONTHS FOR BLOOD PRESSURE  Other Instructions

## 2023-05-26 ENCOUNTER — Ambulatory Visit (INDEPENDENT_AMBULATORY_CARE_PROVIDER_SITE_OTHER): Payer: Medicare Other | Admitting: Internal Medicine

## 2023-05-26 ENCOUNTER — Encounter: Payer: Self-pay | Admitting: Internal Medicine

## 2023-05-26 VITALS — BP 140/80 | HR 73 | Temp 98.5°F | Ht 61.0 in | Wt 199.0 lb

## 2023-05-26 DIAGNOSIS — J4531 Mild persistent asthma with (acute) exacerbation: Secondary | ICD-10-CM | POA: Diagnosis not present

## 2023-05-26 DIAGNOSIS — J069 Acute upper respiratory infection, unspecified: Secondary | ICD-10-CM | POA: Diagnosis not present

## 2023-05-26 DIAGNOSIS — I10 Essential (primary) hypertension: Secondary | ICD-10-CM

## 2023-05-26 MED ORDER — DOXYCYCLINE HYCLATE 100 MG PO TABS: 100.0000 mg | ORAL_TABLET | Freq: Two times a day (BID) | ORAL | 0 refills | Status: AC

## 2023-05-26 MED ORDER — PREDNISONE 20 MG PO TABS: 40.0000 mg | ORAL_TABLET | Freq: Every day | ORAL | 0 refills | Status: AC

## 2023-05-26 NOTE — Progress Notes (Signed)
Subjective:    Patient ID: Denise [name], female    DOB: 11-19-1945, 78 y.o.   MRN: [id number]      HPI Denise [name] is here for  Chief Complaint  Patient presents with   Nasal Congestion    Congestion x 2 weeks; Eye pressure and headaches (sinus pressure)    She is here for an acute visit for cold symptoms. She has asthma.  Her symptoms started about 2 weeks ago   She is experiencing nasal congestion, pnd, sinus pressure, cough, SOB with exertion, wheezing, headaches, lightheadedness.  No fever.     Using albuterol and neb treatments - helped but only temporarily-her wheezing and shortness of breath not resolved.  Take dulera daily, flonase and zyrtec.    Medications and allergies reviewed with patient and updated if appropriate.  Current Outpatient Medications on File Prior to Visit  Medication Sig Dispense Refill   albuterol (PROVENTIL) (2.5 MG/3ML) 0.083% nebulizer solution Take 3 mLs (2.5 mg total) by nebulization every 6 (six) hours as needed for wheezing or shortness of breath. 75 mL 0   albuterol (VENTOLIN HFA) 108 (90 Base) MCG/ACT inhaler INHALE 2 PUFFS BY MOUTH EVERY 6 HOURS AS NEEDED FOR WHEEZING OR  SHORTNESS  OF  BREATH 9 g 2   amLODipine (NORVASC) 10 MG tablet Take 1 tablet (10 mg total) by mouth daily. 90 tablet 3   aspirin 81 MG EC tablet Take 81 mg by mouth daily.     atorvastatin (LIPITOR) 40 MG tablet Take 1 tablet by mouth once daily 90 tablet 3   Calcium Carb-Cholecalciferol (CALCIUM 600 + D PO) Take 1 tablet by mouth daily.     cetirizine (EQ ALLERGY RELIEF, CETIRIZINE,) 10 MG tablet TAKE 1 TABLET BY MOUTH ONCE DAILY AS NEEDED FOR  ALLERGIES 90 tablet 3   cyclobenzaprine (FLEXERIL) 10 MG tablet Take 1 tablet by mouth three times daily as needed for muscle spasm 30 tablet 0   diclofenac Sodium (VOLTAREN) 1 % GEL Apply 4 g topically 4 (four) times daily. 100 g 0   famotidine (PEPCID) 40 MG tablet TAKE 1 TABLET BY MOUTH AT BEDTIME 90 tablet 3    fluticasone (FLONASE) 50 MCG/ACT nasal spray Place 2 sprays into both nostrils daily. 16 g 3   furosemide (LASIX) 40 MG tablet Take 1 tablet by mouth twice daily 180 tablet 3   gabapentin (NEURONTIN) 100 MG capsule Take 1 capsule (100 mg total) by mouth 3 (three) times daily. 90 capsule 3   metFORMIN (GLUCOPHAGE) 500 MG tablet TAKE 2 TABLETS BY MOUTH TWICE DAILY WITH A MEAL 360 tablet 1   mometasone-formoterol (DULERA) 200-5 MCG/ACT AERO Inhale 2 puffs into the lungs 2 (two) times daily. 13 g 11   Multiple Vitamins-Minerals (ALIVE WOMENS 50+) TABS Take 1 tablet by mouth daily.     Omega-3 Fatty Acids (FISH OIL) 1000 MG CAPS Take 1,000 mg by mouth daily.     pantoprazole (PROTONIX) 40 MG tablet TAKE 1 TABLET BY MOUTH TWICE DAILY BEFORE A MEAL 180 tablet 3   Polyvinyl Alcohol-Povidone (REFRESH OP) Place 1 drop into both eyes daily as needed (dry eyes).     potassium chloride (MICRO-K) 10 MEQ CR capsule TAKE 4 CAPSULES BY MOUTH ONCE DAILY 360 capsule 0   valsartan (DIOVAN) 40 MG tablet Take 1 tablet (40 mg total) by mouth every evening. 90 tablet 3   valsartan (DIOVAN) 80 MG tablet Take 1 tablet (80 mg total) by mouth every morning. 90 tablet  3   No current facility-administered medications on file prior to visit.    Review of Systems  Constitutional:  Positive for chills (once). Negative for appetite change and fever.  HENT:  Positive for congestion, postnasal drip and sinus pressure. Negative for ear pain (right ear clogged) and sore throat.        Swollen lymph nodes in neck  Respiratory:  Positive for cough (mild), shortness of breath (with exertion) and wheezing. Negative for chest tightness.   Cardiovascular:  Negative for chest pain.  Musculoskeletal:  Positive for myalgias.  Neurological:  Positive for light-headedness (mild) and headaches.       Objective:   Vitals:   05/26/23 0751  BP: (!) 150/88  Pulse: 73  Temp: 98.5 F (36.9 C)  SpO2: 97%   BP Readings from Last 3  Encounters:  05/26/23 (!) 150/88  05/23/23 (!) 142/70  03/26/23 136/82   Wt Readings from Last 3 Encounters:  05/26/23 199 lb (90.3 kg)  05/23/23 199 lb 12.8 oz (90.6 kg)  03/26/23 197 lb (89.4 kg)   Body mass index is 37.6 kg/m.    Physical Exam Constitutional:      General: She is not in acute distress.    Appearance: Normal appearance. She is not ill-appearing.  HENT:     Head: Normocephalic and atraumatic.     Right Ear: Tympanic membrane, ear canal and external ear normal.     Left Ear: Tympanic membrane, ear canal and external ear normal.     Mouth/Throat:     Mouth: Mucous membranes are moist.     Pharynx: No oropharyngeal exudate or posterior oropharyngeal erythema.  Eyes:     Conjunctiva/sclera: Conjunctivae normal.  Cardiovascular:     Rate and Rhythm: Normal rate and regular rhythm.  Pulmonary:     Effort: Pulmonary effort is normal. No respiratory distress.     Breath sounds: Normal breath sounds. No wheezing or rales.  Musculoskeletal:     Cervical back: Neck supple. No tenderness.  Lymphadenopathy:     Cervical: No cervical adenopathy.  Skin:    General: Skin is warm and dry.  Neurological:     Mental Status: She is alert.            Assessment & Plan:    URI, mild asthma exacerbation: Acute Symptoms started 2 weeks ago Given duration will prescribe doxycycline 100 mg bid x 7 days Prednisone 40 mg daily for 5 days for asthma exacerbation Keep using inhalers, allergy medication Otc cold meds for symptom relief Rest, fluids Call if no improvement   Hypertension: Chronic Elevated here today-improved overall on recheck Just saw Dr Duke Salvia and amlodipine dose was increased No change in medication Continue amlodipine 10 mg daily, valsartan 40 mg in the evening and 80 mg in the morning

## 2023-05-26 NOTE — Patient Instructions (Addendum)
        Medications changes include :   prednisone, doxycycline      Return if symptoms worsen or fail to improve.

## 2023-05-30 DIAGNOSIS — M6281 Muscle weakness (generalized): Secondary | ICD-10-CM | POA: Diagnosis not present

## 2023-05-30 DIAGNOSIS — R262 Difficulty in walking, not elsewhere classified: Secondary | ICD-10-CM | POA: Diagnosis not present

## 2023-05-30 DIAGNOSIS — M5440 Lumbago with sciatica, unspecified side: Secondary | ICD-10-CM | POA: Diagnosis not present

## 2023-06-02 DIAGNOSIS — M5440 Lumbago with sciatica, unspecified side: Secondary | ICD-10-CM | POA: Diagnosis not present

## 2023-06-02 DIAGNOSIS — M6281 Muscle weakness (generalized): Secondary | ICD-10-CM | POA: Diagnosis not present

## 2023-06-02 DIAGNOSIS — R262 Difficulty in walking, not elsewhere classified: Secondary | ICD-10-CM | POA: Diagnosis not present

## 2023-06-04 DIAGNOSIS — M6281 Muscle weakness (generalized): Secondary | ICD-10-CM | POA: Diagnosis not present

## 2023-06-04 DIAGNOSIS — R262 Difficulty in walking, not elsewhere classified: Secondary | ICD-10-CM | POA: Diagnosis not present

## 2023-06-04 DIAGNOSIS — M5440 Lumbago with sciatica, unspecified side: Secondary | ICD-10-CM | POA: Diagnosis not present

## 2023-06-05 ENCOUNTER — Other Ambulatory Visit (HOSPITAL_BASED_OUTPATIENT_CLINIC_OR_DEPARTMENT_OTHER): Payer: Self-pay | Admitting: Cardiovascular Disease

## 2023-06-05 ENCOUNTER — Other Ambulatory Visit: Payer: Self-pay

## 2023-06-05 ENCOUNTER — Other Ambulatory Visit: Payer: Self-pay | Admitting: Internal Medicine

## 2023-06-05 DIAGNOSIS — Z760 Encounter for issue of repeat prescription: Secondary | ICD-10-CM

## 2023-06-08 ENCOUNTER — Encounter (HOSPITAL_BASED_OUTPATIENT_CLINIC_OR_DEPARTMENT_OTHER): Payer: Self-pay | Admitting: Cardiovascular Disease

## 2023-06-08 DIAGNOSIS — I10 Essential (primary) hypertension: Secondary | ICD-10-CM

## 2023-06-09 DIAGNOSIS — M5440 Lumbago with sciatica, unspecified side: Secondary | ICD-10-CM | POA: Diagnosis not present

## 2023-06-09 DIAGNOSIS — R262 Difficulty in walking, not elsewhere classified: Secondary | ICD-10-CM | POA: Diagnosis not present

## 2023-06-09 DIAGNOSIS — M6281 Muscle weakness (generalized): Secondary | ICD-10-CM | POA: Diagnosis not present

## 2023-06-10 ENCOUNTER — Encounter: Payer: Self-pay | Admitting: Internal Medicine

## 2023-06-11 ENCOUNTER — Encounter: Payer: Self-pay | Admitting: Podiatry

## 2023-06-11 ENCOUNTER — Ambulatory Visit (INDEPENDENT_AMBULATORY_CARE_PROVIDER_SITE_OTHER): Payer: Medicare Other | Admitting: Podiatry

## 2023-06-11 VITALS — Ht 61.0 in | Wt 199.0 lb

## 2023-06-11 DIAGNOSIS — M79674 Pain in right toe(s): Secondary | ICD-10-CM

## 2023-06-11 DIAGNOSIS — M2042 Other hammer toe(s) (acquired), left foot: Secondary | ICD-10-CM | POA: Diagnosis not present

## 2023-06-11 DIAGNOSIS — R262 Difficulty in walking, not elsewhere classified: Secondary | ICD-10-CM | POA: Diagnosis not present

## 2023-06-11 DIAGNOSIS — E1142 Type 2 diabetes mellitus with diabetic polyneuropathy: Secondary | ICD-10-CM

## 2023-06-11 DIAGNOSIS — M2142 Flat foot [pes planus] (acquired), left foot: Secondary | ICD-10-CM

## 2023-06-11 DIAGNOSIS — L84 Corns and callosities: Secondary | ICD-10-CM | POA: Diagnosis not present

## 2023-06-11 DIAGNOSIS — E119 Type 2 diabetes mellitus without complications: Secondary | ICD-10-CM

## 2023-06-11 DIAGNOSIS — M2041 Other hammer toe(s) (acquired), right foot: Secondary | ICD-10-CM | POA: Diagnosis not present

## 2023-06-11 DIAGNOSIS — M5440 Lumbago with sciatica, unspecified side: Secondary | ICD-10-CM | POA: Diagnosis not present

## 2023-06-11 DIAGNOSIS — B351 Tinea unguium: Secondary | ICD-10-CM

## 2023-06-11 DIAGNOSIS — M2141 Flat foot [pes planus] (acquired), right foot: Secondary | ICD-10-CM

## 2023-06-11 DIAGNOSIS — M79675 Pain in left toe(s): Secondary | ICD-10-CM | POA: Diagnosis not present

## 2023-06-11 DIAGNOSIS — M6281 Muscle weakness (generalized): Secondary | ICD-10-CM | POA: Diagnosis not present
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
## 2023-06-12 MED ORDER — SPIRONOLACTONE 25 MG PO TABS
25.0000 mg | ORAL_TABLET | Freq: Every day | ORAL | 3 refills | Status: DC
Start: 2023-06-12 — End: 2023-09-19

## 2023-06-12 MED ORDER — AMLODIPINE BESYLATE 5 MG PO TABS: 5.0000 mg | ORAL_TABLET | Freq: Every day | ORAL | 3 refills | Status: AC

## 2023-06-12 NOTE — Telephone Encounter (Signed)
Please see BP update and swelling pictures and advise

## 2023-06-12 NOTE — Telephone Encounter (Signed)
Recommend elevating legs when sitting as much as possible. Amlodipine can cause swelling at higher doses. Recommend reduce Amlodipine to 5mg  daily. Add Spironolactone 25mg  daily. This will help lower BP and with swelling. Repeat BMP in 1 week for monitoring.   Denise Sorrow, NP

## 2023-06-13 DIAGNOSIS — M5440 Lumbago with sciatica, unspecified side: Secondary | ICD-10-CM | POA: Diagnosis not present

## 2023-06-13 DIAGNOSIS — R262 Difficulty in walking, not elsewhere classified: Secondary | ICD-10-CM | POA: Diagnosis not present

## 2023-06-13 DIAGNOSIS — I10 Essential (primary) hypertension: Secondary | ICD-10-CM | POA: Diagnosis not present

## 2023-06-13 DIAGNOSIS — M6281 Muscle weakness (generalized): Secondary | ICD-10-CM | POA: Diagnosis not present

## 2023-06-14 LAB — BASIC METABOLIC PANEL
BUN/Creatinine Ratio: 12 (ref 12–28)
BUN: 11 mg/dL (ref 8–27)
CO2: 26 mmol/L (ref 20–29)
Calcium: 9.2 mg/dL (ref 8.7–10.3)
Chloride: 101 mmol/L (ref 96–106)
Creatinine, Ser: 0.9 mg/dL (ref 0.57–1.00)
Glucose: 91 mg/dL (ref 70–99)
Potassium: 3.7 mmol/L (ref 3.5–5.2)
Sodium: 141 mmol/L (ref 134–144)
eGFR: 66 mL/min/{1.73_m2} (ref >59)

## 2023-06-15 ENCOUNTER — Encounter (HOSPITAL_BASED_OUTPATIENT_CLINIC_OR_DEPARTMENT_OTHER): Payer: Self-pay

## 2023-06-23 DIAGNOSIS — R262 Difficulty in walking, not elsewhere classified: Secondary | ICD-10-CM | POA: Diagnosis not present

## 2023-06-23 DIAGNOSIS — M5440 Lumbago with sciatica, unspecified side: Secondary | ICD-10-CM | POA: Diagnosis not present

## 2023-06-23 DIAGNOSIS — M6281 Muscle weakness (generalized): Secondary | ICD-10-CM | POA: Diagnosis not present

## 2023-06-24 ENCOUNTER — Ambulatory Visit (INDEPENDENT_AMBULATORY_CARE_PROVIDER_SITE_OTHER): Payer: Medicare Other

## 2023-06-24 VITALS — Ht 61.0 in | Wt 199.0 lb

## 2023-06-24 DIAGNOSIS — Z0000 Encounter for general adult medical examination without abnormal findings: Secondary | ICD-10-CM | POA: Diagnosis not present
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
## 2023-06-24 NOTE — Patient Instructions (Signed)
 Denise [name] , Thank you for taking time to come for your Medicare Wellness Visit. I appreciate your ongoing commitment to your health goals. Please review the following plan we discussed and let me know if I can assist you in the future.   Referrals/Orders/Follow-Ups/Clinician Recommendations: It was nice talking to you today.  You are due for a Shingles vaccine.  Each day, aim for 6 glasses of water, plenty of protein in your diet and try to get up and walk/ stretch every hour for 5-10 minutes at a time.    This is a list of the screening recommended for you and due dates:  Health Maintenance  Topic Date Due   Zoster (Shingles) Vaccine (1 of 2) Never done   Yearly kidney health urinalysis for diabetes  04/19/2023   Eye exam for diabetics  05/21/2023   Hemoglobin A1C  09/23/2023   Complete foot exam   06/10/2024   Yearly kidney function blood test for diabetes  06/12/2024   Medicare Annual Wellness Visit  06/23/2024   DTaP/Tdap/Td vaccine (3 - Td or Tdap) 10/19/2028   Pneumonia Vaccine  Completed   Flu Shot  Completed   DEXA scan (bone density measurement)  Completed   Hepatitis C Screening  Completed   HPV Vaccine  Aged Out   COVID-19 Vaccine  Discontinued    Advanced directives: (Copy Requested) Please bring a copy of your health care power of attorney and living will to the office to be added to your chart at your convenience.  Next Medicare Annual Wellness Visit scheduled for next year: Yes

## 2023-06-25 DIAGNOSIS — M5440 Lumbago with sciatica, unspecified side: Secondary | ICD-10-CM | POA: Diagnosis not present

## 2023-06-25 DIAGNOSIS — M6281 Muscle weakness (generalized): Secondary | ICD-10-CM | POA: Diagnosis not present

## 2023-06-25 DIAGNOSIS — R262 Difficulty in walking, not elsewhere classified: Secondary | ICD-10-CM | POA: Diagnosis not present

## 2023-06-27 DIAGNOSIS — R262 Difficulty in walking, not elsewhere classified: Secondary | ICD-10-CM | POA: Diagnosis not present

## 2023-06-27 DIAGNOSIS — M5440 Lumbago with sciatica, unspecified side: Secondary | ICD-10-CM | POA: Diagnosis not present

## 2023-06-27 DIAGNOSIS — M6281 Muscle weakness (generalized): Secondary | ICD-10-CM | POA: Diagnosis not present

## 2023-06-30 DIAGNOSIS — M5440 Lumbago with sciatica, unspecified side: Secondary | ICD-10-CM | POA: Diagnosis not present

## 2023-06-30 DIAGNOSIS — M6281 Muscle weakness (generalized): Secondary | ICD-10-CM | POA: Diagnosis not present

## 2023-06-30 DIAGNOSIS — R262 Difficulty in walking, not elsewhere classified: Secondary | ICD-10-CM | POA: Diagnosis not present

## 2023-07-01 ENCOUNTER — Encounter (HOSPITAL_BASED_OUTPATIENT_CLINIC_OR_DEPARTMENT_OTHER): Payer: Self-pay | Admitting: Pharmacist Clinician (PhC)/ Clinical Pharmacy Specialist

## 2023-07-01 ENCOUNTER — Ambulatory Visit (INDEPENDENT_AMBULATORY_CARE_PROVIDER_SITE_OTHER): Payer: Medicare Other | Admitting: Pharmacist Clinician (PhC)/ Clinical Pharmacy Specialist

## 2023-07-01 VITALS — BP 138/80 | HR 62 | Ht 61.0 in | Wt 199.8 lb

## 2023-07-01 DIAGNOSIS — I10 Essential (primary) hypertension: Secondary | ICD-10-CM

## 2023-07-01 MED ORDER — VALSARTAN 80 MG PO TABS: 80.0000 mg | ORAL_TABLET | Freq: Two times a day (BID) | ORAL | 2 refills | Status: DC

## 2023-07-01 NOTE — Progress Notes (Signed)
 Office Visit    Patient Name: Denise [name] Date of Encounter: 07/01/2023  Primary Care Provider:  Corwin Levins, MD Primary Cardiologist:  Chilton Si, MD  Chief Complaint    Hypertension  Significant Past Medical History   HLD 12/23  LDL 89 on atorvastatin 40  OSA On CPAP  TAVR 2018  CAD 1-39% bilateral ICA stenosis  DM 11/24 A1c 6.0 - well controlled    Allergies  Allergen Reactions   Doxazosin Nausea And Vomiting    Dizziness    Clonidine Hydrochloride Other (See Comments)    Bradycardia   Erythromycin Palpitations   Hydrocodone-Acetaminophen Nausea Only   Pork-Derived Products Diarrhea and Nausea Only   Rosiglitazone Maleate Swelling    SWELLING REACTION UNSPECIFIED     History of Present Illness    Denise [name] is a 78 y.o. female patient of Dr Duke Salvia, in the office today for hypertension evaluation.  She was most recently seen by Dr. Duke Salvia in January, at which time her BP was 142/70.  She had a near syncopal episode in 4/24 and was seen by Gillian Shields at that time.  There were no medication changes made at that time and it was though may have been related to aortic valve prosthesis mismatch.  She is currently taking amlodipine, spironolactone and valsartan.  Today she returns for follow up.  She did note some cramping with spironolactone in the first week or two, but no issues since then.  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  Does use her CPAP, but notes that she only sleeps 3-4 hours per spell right now.  Will go back to her normal routine after April 15.    Blood Pressure Goal:  130/80  Current Medications:  amlodipine 5 mg daily (am), spironolactone 25 mg daily (pm), valsartan 80 mg in am and 40 mg in pm.  Previously tried:  lisinopril - switched to valsartan   Amlodipine 10 mg - edema   Doxazosin - nausea, vomiting, dizziness  Family Hx:  maternal aunt with  MI, HTN, daughter with HTN  Social Hx:      Tobacco: only 2 years, quit in 1972  Alcohol: occasionally  Caffeine: none  Diet:   more home cooked, no pork, but other proteins,  plenty of fruits and vegetables, only occasional sweets, doesn't snack much  Exercise: PT for fall last year, abdominal and back   Home BP readings:  does check at home: averaging 150-160, but did have one at 137 and one at 90.  Diastolic readings all WNL.    Home meter: Equate wrist cuff - 158/73 HR 62; repeat with wrist at heart:  132/85 (this is within 6 points of office reading)  Adherence Assessment  Do you ever forget to take your medication? [] Yes [x] No  Do you ever skip doses due to side effects? [] Yes [x] No  Do you have trouble affording your medicines? [] Yes [x] No  Are you ever unable to pick up your medication due to transportation difficulties? [] Yes [x] No   Adherence strategy: 7 day minder  Accessory Clinical Findings    Lab Results  Component Value Date   CREATININE 0.90 06/13/2023   BUN 11 06/13/2023   NA 141 06/13/2023   K 3.7 06/13/2023   CL 101 06/13/2023   CO2 26 06/13/2023   Lab Results  Component Value Date   ALT 17 03/26/2023   AST 21 03/26/2023  ALKPHOS 58 03/26/2023   BILITOT 0.8 03/26/2023   Lab Results  Component Value Date   HGBA1C 6.0 03/26/2023    Home Medications    Current Outpatient Medications  Medication Sig Dispense Refill   valsartan (DIOVAN) 80 MG tablet Take 1 tablet (80 mg total) by mouth 2 (two) times daily. 180 tablet 2   albuterol (PROVENTIL) (2.5 MG/3ML) 0.083% nebulizer solution Take 3 mLs (2.5 mg total) by nebulization every 6 (six) hours as needed for wheezing or shortness of breath. 75 mL 0   albuterol (VENTOLIN HFA) 108 (90 Base) MCG/ACT inhaler INHALE 2 PUFFS BY MOUTH EVERY 6 HOURS AS NEEDED FOR WHEEZING OR  SHORTNESS  OF  BREATH 9 g 2   amLODipine (NORVASC) 5 MG tablet Take 1 tablet (5 mg total) by mouth daily. 90 tablet 3   aspirin 81  MG EC tablet Take 81 mg by mouth daily.     atorvastatin (LIPITOR) 40 MG tablet Take 1 tablet by mouth once daily 90 tablet 3   Calcium Carb-Cholecalciferol (CALCIUM 600 + D PO) Take 1 tablet by mouth daily.     cetirizine (EQ ALLERGY RELIEF, CETIRIZINE,) 10 MG tablet TAKE 1 TABLET BY MOUTH ONCE DAILY AS NEEDED FOR  ALLERGIES 90 tablet 3   cyclobenzaprine (FLEXERIL) 10 MG tablet Take 1 tablet by mouth three times daily as needed for muscle spasm 30 tablet 0   diclofenac Sodium (VOLTAREN) 1 % GEL Apply 4 g topically 4 (four) times daily. 100 g 0   famotidine (PEPCID) 40 MG tablet TAKE 1 TABLET BY MOUTH AT BEDTIME 90 tablet 3   fluticasone (FLONASE) 50 MCG/ACT nasal spray Place 2 sprays into both nostrils daily. 16 g 3   furosemide (LASIX) 40 MG tablet Take 1 tablet by mouth twice daily 180 tablet 3   gabapentin (NEURONTIN) 100 MG capsule Take 1 capsule (100 mg total) by mouth 3 (three) times daily. 90 capsule 3   metFORMIN (GLUCOPHAGE) 500 MG tablet TAKE 2 TABLETS BY MOUTH TWICE DAILY WITH A MEAL 360 tablet 0   mometasone-formoterol (DULERA) 200-5 MCG/ACT AERO Inhale 2 puffs into the lungs 2 (two) times daily. 13 g 11   Multiple Vitamins-Minerals (ALIVE WOMENS 50+) TABS Take 1 tablet by mouth daily.     Omega-3 Fatty Acids (FISH OIL) 1000 MG CAPS Take 1,000 mg by mouth daily.     pantoprazole (PROTONIX) 40 MG tablet TAKE 1 TABLET BY MOUTH TWICE DAILY BEFORE A MEAL 180 tablet 3   Polyvinyl Alcohol-Povidone (REFRESH OP) Place 1 drop into both eyes daily as needed (dry eyes).     potassium chloride (MICRO-K) 10 MEQ CR capsule TAKE 4 CAPSULES BY MOUTH ONCE DAILY 360 capsule 0   spironolactone (ALDACTONE) 25 MG tablet Take 1 tablet (25 mg total) by mouth daily. 90 tablet 3   No current facility-administered medications for this visit.        Assessment & Plan    Essential hypertension  Assessment: BP is uncontrolled in office BP 138/80 mmHg;  above the goal (<130/80). Home cuff accurate when  held against chest wall Tolerates current medications well without any side effects Denies SOB, palpitation, chest pain, headaches,or swelling Reiterated the importance of regular exercise and low salt diet   Plan:  Increase valsartan to 80 mg twice daily Continue taking amlodipine 5 mg daily in the mornings and spironolactone 25 mg daily in the evenings Patient to keep record of BP readings with heart rate and report to [place] at  the next visit Patient to follow up with PharmD in 2 months  Labs ordered today:  none   Phillips Hay PharmD CPP [facility]  [address] [place], [place] [address] [phone]

## 2023-07-01 NOTE — Patient Instructions (Signed)
 Follow up appointment: Monday May 12 at 8 am  Take your BP meds as follows:  Increase valsartan to 80 mg twice daily  Continue with amlodipine and spironolactone  Check your blood pressure at home daily and keep record of the readings.  Your blood pressure goal is < 130/80  To check your pressure at home you will need to:  1. Sit up in a chair, with feet flat on the floor and back supported. Do not cross your ankles or legs. 2. Rest your left arm so that the cuff is about heart level. If the cuff goes on your upper arm,  then just relax the arm on the table, arm of the chair or your lap. If you have a wrist cuff, we  suggest relaxing your wrist against your chest (think of it as Pledging the Flag with the  wrong arm).  3. Place the cuff snugly around your arm, about 1 inch above the crook of your elbow. The  cords should be inside the groove of your elbow.  4. Sit quietly, with the cuff in place, for about 5 minutes. After that 5 minutes press the power  button to start a reading. 5. Do not talk or move while the reading is taking place.  6. Record your readings on a sheet of paper. Although most cuffs have a memory, it is often  easier to see a pattern developing when the numbers are all in front of you.  7. You can repeat the reading after 1-3 minutes if it is recommended  Make sure your bladder is empty and you have not had caffeine or tobacco within the last 30 min  Always bring your blood pressure log with you to your appointments. If you have not brought your monitor in to be double checked for accuracy, please bring it to your next appointment.  You can find a list of quality blood pressure cuffs at [email or web address]  Important lifestyle changes to control high blood pressure  Intervention  Effect on the BP  Lose extra pounds and watch your waistline Weight loss is one of the most effective lifestyle changes for controlling blood pressure. If you're overweight or obese, losing  even a small amount of weight can help reduce blood pressure. Blood pressure might go down by about 1 millimeter of mercury (mm Hg) with each kilogram (about 2.2 pounds) of weight lost.  Exercise regularly As a general goal, aim for at least 30 minutes of moderate physical activity every day. Regular physical activity can lower high blood pressure by about 5 to 8 mm Hg.  Eat a healthy diet Eating a diet rich in whole grains, fruits, vegetables, and low-fat dairy products and low in saturated fat and cholesterol. A healthy diet can lower high blood pressure by up to 11 mm Hg.  Reduce salt (sodium) in your diet Even a small reduction of sodium in the diet can improve heart health and reduce high blood pressure by about 5 to 6 mm Hg.  Limit alcohol One drink equals 12 ounces of beer, 5 ounces of wine, or 1.5 ounces of 80-proof liquor.  Limiting alcohol to less than one drink a day for women or two drinks a day for men can help lower blood pressure by about 4 mm Hg.   If you have any questions or concerns please use My Chart to send questions or call the office at [phone]

## 2023-07-01 NOTE — Assessment & Plan Note (Signed)
  Assessment: BP is uncontrolled in office BP 138/80 mmHg;  above the goal (<130/80). Home cuff accurate when held against chest wall Tolerates current medications well without any side effects Denies SOB, palpitation, chest pain, headaches,or swelling Reiterated the importance of regular exercise and low salt diet   Plan:  Increase valsartan to 80 mg twice daily Continue taking amlodipine 5 mg daily in the mornings and spironolactone 25 mg daily in the evenings Patient to keep record of BP readings with heart rate and report to [place] at the next visit Patient to follow up with PharmD in 2 months  Labs ordered today:  none

## 2023-07-02 DIAGNOSIS — M5440 Lumbago with sciatica, unspecified side: Secondary | ICD-10-CM | POA: Diagnosis not present

## 2023-07-02 DIAGNOSIS — M6281 Muscle weakness (generalized): Secondary | ICD-10-CM | POA: Diagnosis not present

## 2023-07-02 DIAGNOSIS — R262 Difficulty in walking, not elsewhere classified: Secondary | ICD-10-CM | POA: Diagnosis not present

## 2023-07-04 ENCOUNTER — Other Ambulatory Visit: Payer: Self-pay

## 2023-07-04 ENCOUNTER — Ambulatory Visit (INDEPENDENT_AMBULATORY_CARE_PROVIDER_SITE_OTHER): Admitting: Family Medicine

## 2023-07-04 ENCOUNTER — Encounter: Payer: Self-pay | Admitting: Internal Medicine

## 2023-07-04 ENCOUNTER — Ambulatory Visit: Admitting: Internal Medicine

## 2023-07-04 ENCOUNTER — Emergency Department (HOSPITAL_COMMUNITY)

## 2023-07-04 ENCOUNTER — Encounter (HOSPITAL_COMMUNITY): Payer: Self-pay | Admitting: Emergency Medicine

## 2023-07-04 ENCOUNTER — Emergency Department (HOSPITAL_COMMUNITY)
Admission: EM | Admit: 2023-07-04 | Discharge: 2023-07-04 | Disposition: A | Discharge status: Home / Self Care | Attending: Emergency Medicine | Admitting: Emergency Medicine

## 2023-07-04 VITALS — BP 136/80 | HR 70 | Temp 98.0°F | Ht 61.0 in | Wt 194.0 lb

## 2023-07-04 DIAGNOSIS — I350 Nonrheumatic aortic (valve) stenosis: Secondary | ICD-10-CM | POA: Diagnosis not present

## 2023-07-04 DIAGNOSIS — R079 Chest pain, unspecified: Secondary | ICD-10-CM | POA: Insufficient documentation

## 2023-07-04 DIAGNOSIS — I2510 Atherosclerotic heart disease of native coronary artery without angina pectoris: Secondary | ICD-10-CM | POA: Diagnosis not present

## 2023-07-04 DIAGNOSIS — Z7982 Long term (current) use of aspirin: Secondary | ICD-10-CM | POA: Insufficient documentation

## 2023-07-04 DIAGNOSIS — R9431 Abnormal electrocardiogram [ECG] [EKG]: Secondary | ICD-10-CM

## 2023-07-04 DIAGNOSIS — J45909 Unspecified asthma, uncomplicated: Secondary | ICD-10-CM | POA: Diagnosis not present

## 2023-07-04 DIAGNOSIS — R110 Nausea: Secondary | ICD-10-CM

## 2023-07-04 DIAGNOSIS — E119 Type 2 diabetes mellitus without complications: Secondary | ICD-10-CM | POA: Insufficient documentation

## 2023-07-04 DIAGNOSIS — Z79899 Other long term (current) drug therapy: Secondary | ICD-10-CM | POA: Insufficient documentation

## 2023-07-04 DIAGNOSIS — B349 Viral infection, unspecified: Secondary | ICD-10-CM | POA: Diagnosis not present

## 2023-07-04 DIAGNOSIS — R051 Acute cough: Secondary | ICD-10-CM | POA: Diagnosis not present

## 2023-07-04 DIAGNOSIS — I10 Essential (primary) hypertension: Secondary | ICD-10-CM | POA: Insufficient documentation

## 2023-07-04 DIAGNOSIS — R61 Generalized hyperhidrosis: Secondary | ICD-10-CM | POA: Diagnosis not present

## 2023-07-04 DIAGNOSIS — R112 Nausea with vomiting, unspecified: Secondary | ICD-10-CM | POA: Diagnosis not present

## 2023-07-04 DIAGNOSIS — Z7984 Long term (current) use of oral hypoglycemic drugs: Secondary | ICD-10-CM | POA: Diagnosis not present

## 2023-07-04 DIAGNOSIS — J4531 Mild persistent asthma with (acute) exacerbation: Secondary | ICD-10-CM

## 2023-07-04 DIAGNOSIS — R000 Tachycardia, unspecified: Secondary | ICD-10-CM | POA: Diagnosis not present

## 2023-07-04 DIAGNOSIS — Z981 Arthrodesis status: Secondary | ICD-10-CM | POA: Diagnosis not present

## 2023-07-04 DIAGNOSIS — R0789 Other chest pain: Secondary | ICD-10-CM | POA: Diagnosis not present

## 2023-07-04 LAB — CBC
HCT: 36.1 % (ref 36.0–46.0)
Hemoglobin: 11.6 g/dL — ABNORMAL LOW (ref 12.0–15.0)
MCH: 26.7 pg (ref 26.0–34.0)
MCHC: 32.1 g/dL (ref 30.0–36.0)
MCV: 83 fL (ref 80.0–100.0)
Platelets: 207 10*3/uL (ref 150–400)
RBC: 4.35 MIL/uL (ref 3.87–5.11)
RDW: 15.3 % (ref 11.5–15.5)
WBC: 9.1 10*3/uL (ref 4.0–10.5)
nRBC: 0 % (ref 0.0–0.2)

## 2023-07-04 LAB — TROPONIN I (HIGH SENSITIVITY)
Troponin I (High Sensitivity): 12 ng/L (ref <18)
Troponin I (High Sensitivity): 14 ng/L (ref <18)

## 2023-07-04 LAB — BASIC METABOLIC PANEL
Anion gap: 13 (ref 5–15)
BUN: 19 mg/dL (ref 8–23)
CO2: 23 mmol/L (ref 22–32)
Calcium: 9.3 mg/dL (ref 8.9–10.3)
Chloride: 105 mmol/L (ref 98–111)
Creatinine, Ser: 1.02 mg/dL — ABNORMAL HIGH (ref 0.44–1.00)
GFR, Estimated: 57 mL/min — ABNORMAL LOW (ref >60)
Glucose, Bld: 86 mg/dL (ref 70–99)
Potassium: 4 mmol/L (ref 3.5–5.1)
Sodium: 141 mmol/L (ref 135–145)

## 2023-07-04 LAB — POCT INFLUENZA A/B
Influenza A, POC: NEGATIVE
Influenza B, POC: NEGATIVE

## 2023-07-04 LAB — POC COVID19 BINAXNOW: SARS Coronavirus 2 Ag: NEGATIVE

## 2023-07-04 MED ORDER — ONDANSETRON 4 MG PO TBDP: 4.0000 mg | ORAL_TABLET | Freq: Three times a day (TID) | ORAL | 0 refills | Status: AC | PRN

## 2023-07-04 NOTE — ED Provider Notes (Signed)
 [facility] EMERGENCY DEPARTMENT AT [facility] Provider Note   CSN: [id number] Arrival date & time: 07/04/23  1640     History  Chief Complaint  Patient presents with   Chest Pain    Denise [name] is a 78 y.o. female.  78 year old female with a history of severe aortic stenosis status post TAVR, hypertension, hyperlipidemia, diabetes, and asthma who presents to the emergency department chest tightness.  Patient reports that on Wednesday Denise [name] started feeling poorly after physical therapy.  Became diaphoretic and nauseous.  Had another episode later where Denise [name] actually threw up.  Also has had a mild cough and congestion.  Went to an outpatient doctor today and was describing her symptoms and some chest tightness.  They obtained an EKG that showed some changes and referred her to the emergency department.  Currently her chest pressure is 2/10 in severity.  Was given aspirin and nitroglycerin without relief.  Also received Zofran and route.  No personal history of MI.       Home Medications Prior to Admission medications   Medication Sig Start Date End Date Taking? Authorizing Provider  ondansetron (ZOFRAN-ODT) 4 MG disintegrating tablet Take 1 tablet (4 mg total) by mouth every 8 (eight) hours as needed for nausea or vomiting. 07/04/23  Yes Rondel Baton, MD  albuterol (PROVENTIL) (2.5 MG/3ML) 0.083% nebulizer solution Take 3 mLs (2.5 mg total) by nebulization every 6 (six) hours as needed for wheezing or shortness of breath. 09/23/22   Arby Barrette, MD  albuterol (VENTOLIN HFA) 108 (90 Base) MCG/ACT inhaler INHALE 2 PUFFS BY MOUTH EVERY 6 HOURS AS NEEDED FOR WHEEZING OR  SHORTNESS  OF  BREATH 08/01/22   Corwin Levins, MD  amLODipine (NORVASC) 5 MG tablet Take 1 tablet (5 mg total) by mouth daily. 06/12/23 09/10/23  Alver Sorrow, NP  aspirin 81 MG EC tablet Take 81 mg by mouth daily.    [provider]  atorvastatin (LIPITOR) 40 MG tablet Take 1 tablet by  mouth once daily 10/22/22   Corwin Levins, MD  Calcium Carb-Cholecalciferol (CALCIUM 600 + D PO) Take 1 tablet by mouth daily.    [provider]  cetirizine (EQ ALLERGY RELIEF, CETIRIZINE,) 10 MG tablet TAKE 1 TABLET BY MOUTH ONCE DAILY AS NEEDED FOR  ALLERGIES 10/20/18   Corwin Levins, MD  cyclobenzaprine (FLEXERIL) 10 MG tablet Take 1 tablet by mouth three times daily as needed for muscle spasm 11/20/22   Georgina Quint, MD  diclofenac Sodium (VOLTAREN) 1 % GEL Apply 4 g topically 4 (four) times daily. 01/10/20   Melene Plan, DO  famotidine (PEPCID) 40 MG tablet TAKE 1 TABLET BY MOUTH AT BEDTIME 03/07/23   Meryl Dare, MD  fluticasone [facility]) 50 MCG/ACT nasal spray Place 2 sprays into both nostrils daily. 07/31/21   Corwin Levins, MD  furosemide (LASIX) 40 MG tablet Take 1 tablet by mouth twice daily 02/21/23   Corwin Levins, MD  gabapentin (NEURONTIN) 100 MG capsule Take 1 capsule (100 mg total) by mouth 3 (three) times daily. 10/21/22   Corwin Levins, MD  metFORMIN (GLUCOPHAGE) 500 MG tablet TAKE 2 TABLETS BY MOUTH TWICE DAILY WITH A MEAL 06/05/23   Corwin Levins, MD  mometasone-formoterol [facility]) 200-5 MCG/ACT AERO Inhale 2 puffs into the lungs 2 (two) times daily. 08/01/22   Corwin Levins, MD  Multiple Vitamins-Minerals (ALIVE WOMENS 50+) TABS Take 1 tablet by mouth daily.    [provider]  Omega-3 Fatty Acids (FISH OIL) 1000 MG CAPS Take 1,000 mg by mouth daily.    [provider]  pantoprazole (PROTONIX) 40 MG tablet TAKE 1 TABLET BY MOUTH TWICE DAILY BEFORE A MEAL 03/07/23   Meryl Dare, MD  Polyvinyl Alcohol-Povidone (REFRESH OP) Place 1 drop into both eyes daily as needed (dry eyes).    [provider]  potassium chloride (MICRO-K) 10 MEQ CR capsule TAKE 4 CAPSULES BY MOUTH ONCE DAILY 05/22/23   Corwin Levins, MD  spironolactone (ALDACTONE) 25 MG tablet Take 1 tablet (25 mg total) by mouth daily. 06/12/23 09/10/23  Alver Sorrow, NP  valsartan  (DIOVAN) 80 MG tablet Take 1 tablet (80 mg total) by mouth 2 (two) times daily. 07/01/23   Chilton Si, MD      Allergies    Doxazosin, Clonidine hydrochloride, Erythromycin, Hydrocodone-acetaminophen, Pork-derived products, and Rosiglitazone maleate    Review of Systems   Review of Systems  Physical Exam Updated Vital Signs BP 125/66   Pulse 61   Temp 98.7 F (37.1 C) (Oral)   Resp 20   SpO2 97%  Physical Exam Vitals and nursing note reviewed.  Constitutional:      General: Denise [name] is not in acute distress.    Appearance: Denise [name] is well-developed.  HENT:     Head: Normocephalic and atraumatic.     Right Ear: External ear normal.     Left Ear: External ear normal.     Nose: Nose normal.  Eyes:     Extraocular Movements: Extraocular movements intact.     Conjunctiva/sclera: Conjunctivae normal.     Pupils: Pupils are equal, round, and reactive to light.  Cardiovascular:     Rate and Rhythm: Normal rate and regular rhythm.     Heart sounds: No murmur heard.    Comments: Chest pain not reproducible Pulmonary:     Effort: Pulmonary effort is normal. No respiratory distress.     Breath sounds: Normal breath sounds.  Musculoskeletal:     Cervical back: Normal range of motion and neck supple.     Right lower leg: No edema.     Left lower leg: No edema.  Skin:    General: Skin is warm and dry.  Neurological:     Mental Status: Denise [name] is alert and oriented to person, place, and time. Mental status is at baseline.  Psychiatric:        Mood and Affect: Mood normal.     ED Results / Procedures / Treatments   Labs (all labs ordered are listed, but only abnormal results are displayed) Labs Reviewed  BASIC METABOLIC PANEL - Abnormal; Notable for the following components:      Result Value   Creatinine, Ser 1.02 (*)    GFR, Estimated 57 (*)    All other components within normal limits  CBC - Abnormal; Notable for the following components:   Hemoglobin 11.6 (*)    All other  components within normal limits  TROPONIN I (HIGH SENSITIVITY)  TROPONIN I (HIGH SENSITIVITY)    EKG EKG Interpretation Date/Time:  Friday July 04 2023 16:59:22 EST Ventricular Rate:  83 PR Interval:  166 QRS Duration:  88 QT Interval:  370 QTC Calculation: 434 R Axis:   6  Text Interpretation: Normal sinus rhythm Minimal voltage criteria for LVH, may be normal variant ( R in aVL ) Borderline ECG Confirmed by Vonita Moss [phone]) on 07/04/2023 9:20:03 PM  Radiology DG Chest 2 View Result  Date: 07/04/2023 CLINICAL DATA:  Chest pain, nausea, vomiting EXAM: CHEST - 2 VIEW COMPARISON:  09/23/2022 chest radiograph. FINDINGS: TAVR in place. Partially visualized bilateral posterior spinal fusion hardware in the upper lumbar spine. Stable cardiomediastinal silhouette with normal heart size. No pneumothorax. No pleural effusion. Lungs appear clear, with no acute consolidative airspace disease and no pulmonary edema. Surgical clips overlie the right breast. IMPRESSION: No active cardiopulmonary disease. Electronically Signed   By: Delbert Phenix M.D.   On: 07/04/2023 21:07    Procedures Procedures    Medications Ordered in ED Medications - No data to display  ED Course/ Medical Decision Making/ A&P                                 Medical Decision Making Amount and/or Complexity of Data Reviewed Labs: ordered. Radiology: ordered.  Risk Prescription drug management.   Dimitria Ketchum is a 78 y.o. female with comorbidities that complicate the patient evaluation including hypertension, hyperlipidemia, diabetes, and asthma who presents to the emergency department chest tightness  Initial Ddx:  MI, PE, pneumonia, dissection, pericarditis, costochondritis, reflux  MDM:  With the patient's chest discomfort will obtain EKG and troponins to evaluate for MI.  Given the description of pain, diaphoresis, vomiting, and lack of shortness of breath feel that PE is less likely.  Considered  dissection but with their symmetric pulses, history, and description of the pain feel it is less likely.  If chest x-ray reveals widened mediastinum or any other concerning findings will consider CTA.  Also considered pericarditis but description is unlikely and they do not have risk factors for this diagnosis.  Chest pain not reproducible so feel it costochondritis less likely.  No infectious symptoms to suggest pneumonia at this time that would be causing pleuritic chest pain.  Plan:  Labs Troponin EKG Chest x-ray  ED Summary/Re-evaluation:  EKG and serial troponins not reflective of MI.  Chest x-ray without widened mediastinum or pneumonia or other acute findings.  Do feel that patient likely has a viral illness that could be causing her symptoms.  However, COVID and flu were negative prior to arrival.  Patient discharged home with Zofran and instructed to follow-up with cardiology as an outpatient.  Referral was placed.  This patient presents to the ED for concern of complaints listed in HPI, this involves an extensive number of treatment options, and is a complaint that carries with it a high risk of complications and morbidity. Disposition including potential need for admission considered.   Dispo: DC Home. Return precautions discussed including, but not limited to, those listed in the AVS. Allowed pt time to ask questions which were answered fully prior to dc.  Additional history obtained from daughter Records reviewed Outpatient Clinic Notes The following labs were independently interpreted: Serial Troponins and show no acute abnormality I independently reviewed the following imaging with scope of interpretation limited to determining acute life threatening conditions related to emergency care: Chest x-ray and agree with the radiologist interpretation with the following exceptions: none I personally reviewed and interpreted cardiac monitoring: normal sinus rhythm  I personally reviewed  and interpreted the pt's EKG: see above for interpretation  I have reviewed the patients home medications and made adjustments as needed Social Determinants of health:  Elderly   Final Clinical Impression(s) / ED Diagnoses Final diagnoses:  Chest pain, unspecified type  Nausea and vomiting, unspecified vomiting type  Viral illness  Rx / DC Orders ED Discharge Orders          Ordered    ondansetron (ZOFRAN-ODT) 4 MG disintegrating tablet  Every 8 hours PRN        07/04/23 2120    Ambulatory referral to Cardiology        07/04/23 2121              Rondel Baton, MD 07/04/23 2136

## 2023-07-04 NOTE — ED Triage Notes (Signed)
 Pt BIB [facility] from primary care for midsternal 2/10 CP.   2 episodes in the last 2 days of NV, diaphoresis.  Pt has been HTN.  PT initially 198/98 98% HR 90 CBG 92.  2 nitroglycerin given with no improvement to pain.  BP 108/56 after nitroglycerin.    324 ASA,  4mg  zofran

## 2023-07-04 NOTE — ED Provider Triage Note (Signed)
 Emergency Medicine Provider Triage Evaluation Note  Denise [name] , a 78 y.o. female  was evaluated in triage.  Pt complains of chest pain.  Patient has been having nausea and vomiting for the past 3 days.  She went to her primary care office earlier today for evaluation and started to have some chest tightness there.  She had a EKG which showed changes to her T waves and was told to come here for further evaluation.  Denies shortness of breath or fevers.  Review of Systems  Positive: Chest pain, nausea, vomiting Negative: Positional chest pain  Physical Exam  BP 127/75 (BP Location: Left Arm)   Pulse 82   Temp 98.7 F (37.1 C) (Oral)   Resp 16   SpO2 98%  Gen:   Awake, no distress   Resp:  Normal effort  MSK:   Moves extremities without difficulty  Other:    Medical Decision Making  Medically screening exam initiated at 5:29 PM.  Appropriate orders placed.  Denise [name] was informed that the remainder of the evaluation will be completed by another provider, this initial triage assessment does not replace that evaluation, and the importance of remaining in the ED until their evaluation is complete.  Tested negative for flu and COVID at PCP office.   Maxwell Marion, PA-C 07/04/23 1730

## 2023-07-04 NOTE — Discharge Instructions (Signed)
 You were seen for your chest pain in the emergency department.   At home, please take the zofran for your nausea and vomiting.   Follow-up with your primary doctor in 2-3 days regarding your visit.  Cardiology will be calling you regarding an appointment within the next 72 hours.  You may contact them if you do not hear from them in that time using the information in this packet.  Return immediately to the emergency department if you experience any of the following: Worsening pain, difficulty breathing, unexplained vomiting or sweating, or any other concerning symptoms.    Thank you for visiting our Emergency Department. It was a pleasure taking care of you today.
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
## 2023-07-05 ENCOUNTER — Encounter (HOSPITAL_BASED_OUTPATIENT_CLINIC_OR_DEPARTMENT_OTHER): Payer: Self-pay | Admitting: Cardiovascular Disease

## 2023-07-07 ENCOUNTER — Encounter: Payer: Self-pay | Admitting: Physician Assistant

## 2023-07-07 ENCOUNTER — Ambulatory Visit: Attending: Physician Assistant | Admitting: Physician Assistant

## 2023-07-07 VITALS — BP 138/80 | HR 84 | Ht 61.0 in | Wt 198.4 lb

## 2023-07-07 DIAGNOSIS — E785 Hyperlipidemia, unspecified: Secondary | ICD-10-CM | POA: Diagnosis present

## 2023-07-07 DIAGNOSIS — I25118 Atherosclerotic heart disease of native coronary artery with other forms of angina pectoris: Secondary | ICD-10-CM | POA: Insufficient documentation

## 2023-07-07 DIAGNOSIS — Z952 Presence of prosthetic heart valve: Secondary | ICD-10-CM | POA: Diagnosis not present

## 2023-07-07 DIAGNOSIS — I10 Essential (primary) hypertension: Secondary | ICD-10-CM | POA: Insufficient documentation

## 2023-07-07 DIAGNOSIS — R262 Difficulty in walking, not elsewhere classified: Secondary | ICD-10-CM | POA: Diagnosis not present

## 2023-07-07 DIAGNOSIS — Z953 Presence of xenogenic heart valve: Secondary | ICD-10-CM | POA: Insufficient documentation

## 2023-07-07 DIAGNOSIS — I350 Nonrheumatic aortic (valve) stenosis: Secondary | ICD-10-CM | POA: Insufficient documentation

## 2023-07-07 DIAGNOSIS — M6281 Muscle weakness (generalized): Secondary | ICD-10-CM | POA: Diagnosis not present

## 2023-07-07 DIAGNOSIS — Z5181 Encounter for therapeutic drug level monitoring: Secondary | ICD-10-CM | POA: Diagnosis not present

## 2023-07-07 DIAGNOSIS — M5440 Lumbago with sciatica, unspecified side: Secondary | ICD-10-CM | POA: Diagnosis not present

## 2023-07-07 NOTE — Patient Instructions (Signed)
 Lab Work: Fasting Lipid panel If you have labs (blood work) drawn today and your tests are completely normal, you will receive your results only by: MyChart Message (if you have MyChart) OR A paper copy in the mail If you have any lab test that is abnormal or we need to change your treatment, we will call you to review the results.   Testing/Procedures: Echocardiogram Your physician has requested that you have an echocardiogram. Echocardiography is a painless test that uses sound waves to create images of your heart. It provides your doctor with information about the size and shape of your heart and how well your heart's chambers and valves are working. This procedure takes approximately one hour. There are no restrictions for this procedure. Please do NOT wear cologne, perfume, aftershave, or lotions (deodorant is allowed). Please arrive 15 minutes prior to your appointment time.  Please note: We ask at that you not bring children with you during ultrasound (echo/ vascular) testing. Due to room size and safety concerns, children are not allowed in the ultrasound rooms during exams. Our front office staff cannot provide observation of children in our lobby area while testing is being conducted. An adult accompanying a patient to their appointment will only be allowed in the ultrasound room at the discretion of the ultrasound technician under special circumstances. We apologize for any inconvenience.   Follow-Up: At [facility], you and your health needs are our priority.  As part of our continuing mission to provide you with exceptional heart care, we have created designated Provider Care Teams.  These Care Teams include your primary Cardiologist (physician) and Advanced Practice Providers (APPs -  Physician Assistants and Nurse Practitioners) who all work together to provide you with the care you need, when you need it.  We recommend signing up for the patient portal called "MyChart".   Sign up information is provided on this After Visit Summary.  MyChart is used to connect with patients for Virtual Visits (Telemedicine).  Patients are able to view lab/test results, encounter notes, upcoming appointments, etc.  Non-urgent messages can be sent to your provider as well.   To learn more about what you can do with MyChart, go to [email or web address].    Your next appointment:   6 month(s)  Provider:   Chilton Si, MD     Other Instructions   1st Floor: - Lobby - Registration  - Pharmacy  - Lab - Cafe  2nd Floor: - PV Lab - Diagnostic Testing (echo, CT, nuclear med)  3rd Floor: - Vacant  4th Floor: - TCTS (cardiothoracic surgery) - AFib Clinic - Structural Heart Clinic - Vascular Surgery  - Vascular Ultrasound  5th Floor: - [facility] (general and EP) - Clinical Pharmacy for coumadin, hypertension, lipid, weight-loss medications, and med management appointments    Valet parking services will be available as well.

## 2023-07-07 NOTE — Progress Notes (Signed)
 Cardiology Office Note:  .   Date:  07/07/2023  ID:  Denise [name], DOB [birth year], MRN [id number] PCP: Corwin Levins, MD  [facility] Providers Cardiologist:  Chilton Si, MD {  History of Present Illness: .   Denise [name] is a 78 y.o. female with a past medical history of severe aortic stenosis status post TAVR, hypertension, hyperlipidemia, diabetes, and asthma who presents for follow-up appointment.  Recently in the ER a few days ago and reported that prior Wednesday she started feeling poorly after physical therapy.  Became diaphoretic and nauseous.  Had another episode later where she actually vomited.  Mild coughing congestion.  Went to an outpatient doctor and also mentions of chest tightness.  They obtained an EKG which showed some changes and referred her to the ER.  At the ER, chest pressure was 2 out of 10 in severity and she was given aspirin and nitroglycerin without relief.  Also received Zofran and route.  No history of MI.  EKG and serial troponins did not reflect MI.  Chest x-ray without widening of mediastinum or pneumonia.  Likely a viral illness that are causing her symptoms.  Discharged home with Zofran and instructed to follow-up with cardiology.  Today, she presents with a history of TAVR valve, hypertension, and arthritis,  with a recent episode of chest tightness, sweating, nausea, and vomiting. The episode occurred during a physical therapy session, leading to an ER visit. The patient also reports a persistent headache and blurry vision for a couple of weeks. The patient denies any history of heart disease.  In addition to the chest tightness, the patient has been experiencing a persistent headache and blurry vision for a couple of weeks. The patient was prescribed doxycycline and prednisone by her primary care provider, suspecting an infection. However, the symptoms have not completely resolved.  The patient also reports a recent change in  her hypertension medication regimen. The dose of valsartan was increased from 80mg  in the morning and 40mg  in the evening to 80mg  twice a day. The patient also reports a change in the dose of amlodipine from 5mg  to 10mg  due to swelling in the ankles and feet. The dose was subsequently reduced back to 5mg .  The patient also reports a history of arthritis in the ankles and a recent diagnosis of compression fractures in the back from T10 to T12. The patient is currently undergoing physical therapy for the back pain.  Reports no shortness of breath nor dyspnea on exertion. Reports no chest pain, pressure, or tightness. No edema, orthopnea, PND. Reports no palpitations.   Discussed the use of AI scribe software for clinical note transcription with the patient, who gave verbal consent to proceed.  ROS: Pertinent ROS in HPI  Studies Reviewed: .       Echocardiogram 09/04/2022 IMPRESSIONS     1. Left ventricular ejection fraction by 3D volume is 53 %. The left  ventricle has low normal function. The left ventricle has no regional wall  motion abnormalities. There is moderate concentric left ventricular  hypertrophy. Left ventricular diastolic  function could not be evaluated.   2. Right ventricular systolic function is normal. The right ventricular  size is normal. Tricuspid regurgitation signal is inadequate for assessing  PA pressure.   3. Left atrial size was mildly dilated.   4. The mitral valve is degenerative. Mild mitral valve regurgitation. No  evidence of mitral stenosis. Moderate mitral annular calcification.   5. The aortic valve has been repaired/replaced.  Aortic valve  regurgitation is trivial. No aortic stenosis is present. There is a 23 mm  Sapien prosthetic (TAVR) valve present in the aortic position. Procedure  Date: 09/24/2016.   6. The inferior vena cava is normal in size with greater than 50%  respiratory variability, suggesting right atrial pressure of 3 mmHg.   FINDINGS    Left Ventricle: Left ventricular ejection fraction by 3D volume is 53 %.  The left ventricle has low normal function. The left ventricle has no  regional wall motion abnormalities. Global longitudinal strain performed  but not reported based on interpreter   judgement due to suboptimal tracking. The left ventricular internal  cavity size was normal in size. There is moderate concentric left  ventricular hypertrophy. Left ventricular diastolic function could not be  evaluated due to mitral annular calcification   (moderate or greater). Left ventricular diastolic function could not be  evaluated.   Right Ventricle: The right ventricular size is normal. No increase in  right ventricular wall thickness. Right ventricular systolic function is  normal. Tricuspid regurgitation signal is inadequate for assessing PA  pressure.   Left Atrium: Left atrial size was mildly dilated.   Right Atrium: Right atrial size was normal in size.   Pericardium: There is no evidence of pericardial effusion.   Mitral Valve: The mitral valve is degenerative in appearance. Moderate  mitral annular calcification. Mild mitral valve regurgitation. No evidence  of mitral valve stenosis.   Tricuspid Valve: The tricuspid valve is normal in structure. Tricuspid  valve regurgitation is trivial. No evidence of tricuspid stenosis.   Aortic Valve: The aortic valve has been repaired/replaced. Aortic valve  regurgitation is trivial. Aortic regurgitation PHT measures 465 msec. No  aortic stenosis is present. Aortic valve mean gradient measures 17.0 mmHg.  Aortic valve peak gradient  measures 32.5 mmHg. Aortic valve area, by VTI measures 1.09 cm. There is  a 23 mm Sapien prosthetic, stented (TAVR) valve present in the aortic  position. Procedure Date: 09/24/2016.   Pulmonic Valve: The pulmonic valve was normal in structure. Pulmonic valve  regurgitation is trivial. No evidence of pulmonic stenosis.   Aorta: The  aortic root is normal in size and structure.   Venous: The inferior vena cava is normal in size with greater than 50%  respiratory variability, suggesting right atrial pressure of 3 mmHg.   IAS/Shunts: No atrial level shunt detected by color flow Doppler.       STOP-Bang Score:         Physical Exam:   VS:  BP 138/80   Pulse 84   Ht 5\' 1"  (1.549 m)   Wt 198 lb 6.4 oz (90 kg)   SpO2 98%   BMI 37.49 kg/m    Wt Readings from Last 3 Encounters:  07/07/23 198 lb 6.4 oz (90 kg)  07/04/23 194 lb (88 kg)  07/01/23 199 lb 12.8 oz (90.6 kg)    GEN: Well nourished, well developed in no acute distress NECK: No JVD; No carotid bruits CARDIAC: RRR, no murmurs, rubs, gallops RESPIRATORY:  Clear to auscultation without rales, wheezing or rhonchi  ABDOMEN: Soft, non-tender, non-distended EXTREMITIES:  No edema; No deformity   ASSESSMENT AND PLAN: .    Chest Pain EKG and lab work in ER non-cardiac; office EKG questionable. Symptoms likely viral, not cardiac. Atypical myocardial infarction unlikely. Echocardiogram recommended for TAVR valve and heart function. - Order echocardiogram to check TAVR valve and heart pump function. - Consider further cardiac evaluation if symptoms  persist.  Hypertension Blood pressure management adjusted. Valsartan increased to 80 mg twice daily. Amlodipine reduced to 5 mg due to edema. Blood pressure near target. - Continue current antihypertensive regimen. - Monitor blood pressure regularly.  Peripheral Edema Leg swelling, particularly ankles, possibly due to amlodipine. Non-pitting, may be exacerbated by sitting. Arthritis may contribute. Spironolactone used for fluid retention. - Refer to elastic therapy for custom-fitted compression stockings. - Encourage regular movement and elevation of legs during work.  Back Pain with Compression Fractures Compression fractures T10-T12. Undergoing physical therapy for back strengthening. Unable to wear back brace due  to abdominal tenderness. - Continue physical therapy for back strengthening.  Follow-up Requires follow-up for cardiac evaluation and lab work. Lipid panel pending. - Schedule follow-up appointment with Dr. Duke Salvia. - Order lipid panel and advise to complete fasting lab work when convenient.      Dispo: She will follow-up in 6 months  Signed, Sharlene Dory, PA-C

## 2023-07-08 DIAGNOSIS — E785 Hyperlipidemia, unspecified: Secondary | ICD-10-CM | POA: Diagnosis not present

## 2023-07-09 DIAGNOSIS — R262 Difficulty in walking, not elsewhere classified: Secondary | ICD-10-CM | POA: Diagnosis not present

## 2023-07-09 DIAGNOSIS — M5440 Lumbago with sciatica, unspecified side: Secondary | ICD-10-CM | POA: Diagnosis not present

## 2023-07-09 DIAGNOSIS — M6281 Muscle weakness (generalized): Secondary | ICD-10-CM | POA: Diagnosis not present

## 2023-07-09 LAB — LIPID PANEL
Chol/HDL Ratio: 2.2 ratio (ref 0.0–4.4)
Cholesterol, Total: 197 mg/dL (ref 100–199)
HDL: 88 mg/dL (ref >39)
LDL Chol Calc (NIH): 97 mg/dL (ref 0–99)
Triglycerides: 65 mg/dL (ref 0–149)
VLDL Cholesterol Cal: 12 mg/dL (ref 5–40)

## 2023-07-13 ENCOUNTER — Encounter: Payer: Self-pay | Admitting: Physician Assistant

## 2023-07-14 DIAGNOSIS — M6281 Muscle weakness (generalized): Secondary | ICD-10-CM | POA: Diagnosis not present

## 2023-07-14 DIAGNOSIS — M5440 Lumbago with sciatica, unspecified side: Secondary | ICD-10-CM | POA: Diagnosis not present

## 2023-07-14 DIAGNOSIS — R262 Difficulty in walking, not elsewhere classified: Secondary | ICD-10-CM | POA: Diagnosis not present

## 2023-07-16 DIAGNOSIS — M6281 Muscle weakness (generalized): Secondary | ICD-10-CM | POA: Diagnosis not present

## 2023-07-16 DIAGNOSIS — R262 Difficulty in walking, not elsewhere classified: Secondary | ICD-10-CM | POA: Diagnosis not present

## 2023-07-16 DIAGNOSIS — M5440 Lumbago with sciatica, unspecified side: Secondary | ICD-10-CM | POA: Diagnosis not present

## 2023-07-17 ENCOUNTER — Telehealth: Payer: Self-pay | Admitting: *Deleted

## 2023-07-17 DIAGNOSIS — Z79899 Other long term (current) drug therapy: Secondary | ICD-10-CM

## 2023-07-17 DIAGNOSIS — I2510 Atherosclerotic heart disease of native coronary artery without angina pectoris: Secondary | ICD-10-CM

## 2023-07-17 DIAGNOSIS — I25118 Atherosclerotic heart disease of native coronary artery with other forms of angina pectoris: Secondary | ICD-10-CM

## 2023-07-17 DIAGNOSIS — I350 Nonrheumatic aortic (valve) stenosis: Secondary | ICD-10-CM

## 2023-07-17 DIAGNOSIS — E785 Hyperlipidemia, unspecified: Secondary | ICD-10-CM

## 2023-07-17 DIAGNOSIS — E7800 Pure hypercholesterolemia, unspecified: Secondary | ICD-10-CM

## 2023-07-17 DIAGNOSIS — Z952 Presence of prosthetic heart valve: Secondary | ICD-10-CM

## 2023-07-17 MED ORDER — ATORVASTATIN CALCIUM 80 MG PO TABS: 80.0000 mg | ORAL_TABLET | Freq: Every day | ORAL | 2 refills | Status: DC

## 2023-07-17 NOTE — Telephone Encounter (Signed)
 Sharlene Dory, PA-C to [employer] Triage     07/13/23  9:48 PM Result Note With your history of severe aortic stenosis your LDL goal would be less than 70.  Yours was 97.  I would like to increase her Lipitor to 80 mg daily and recheck a lipid panel in 3 months. Thanks! Sharlene Dory, PA-C   4th attempt at trying to make contact with the pt to endorse lab results and recommendations per [facility].  Several messages left and no return call back received from the pt.  Pt did however view the results and plan from [facility] via her active mychart account.    Will go ahead and send the increased lipitor 80 mg daily to her pharmacy on file, as well as order the repeat lipids in 3 months (due around 10/17/23).  Will endorse this to the pt through her mychart, being she does seem to be more responsive to this communication vs by telephone.    Will advise her in the message that med was sent and to report to lab in 3 months for lipid check (around 10/17/23).  Lipid orders placed for then and released in the system.

## 2023-07-18 ENCOUNTER — Encounter (HOSPITAL_BASED_OUTPATIENT_CLINIC_OR_DEPARTMENT_OTHER): Payer: Self-pay | Admitting: Cardiovascular Disease

## 2023-07-18 DIAGNOSIS — M6281 Muscle weakness (generalized): Secondary | ICD-10-CM | POA: Diagnosis not present

## 2023-07-18 DIAGNOSIS — M5440 Lumbago with sciatica, unspecified side: Secondary | ICD-10-CM | POA: Diagnosis not present

## 2023-07-18 DIAGNOSIS — R262 Difficulty in walking, not elsewhere classified: Secondary | ICD-10-CM | POA: Diagnosis not present

## 2023-07-21 DIAGNOSIS — M6281 Muscle weakness (generalized): Secondary | ICD-10-CM | POA: Diagnosis not present

## 2023-07-21 DIAGNOSIS — M5440 Lumbago with sciatica, unspecified side: Secondary | ICD-10-CM | POA: Diagnosis not present

## 2023-07-21 DIAGNOSIS — R262 Difficulty in walking, not elsewhere classified: Secondary | ICD-10-CM | POA: Diagnosis not present

## 2023-07-23 DIAGNOSIS — M6281 Muscle weakness (generalized): Secondary | ICD-10-CM | POA: Diagnosis not present

## 2023-07-23 DIAGNOSIS — R262 Difficulty in walking, not elsewhere classified: Secondary | ICD-10-CM | POA: Diagnosis not present

## 2023-07-23 DIAGNOSIS — M5440 Lumbago with sciatica, unspecified side: Secondary | ICD-10-CM | POA: Diagnosis not present

## 2023-07-25 ENCOUNTER — Ambulatory Visit (INDEPENDENT_AMBULATORY_CARE_PROVIDER_SITE_OTHER)

## 2023-07-25 ENCOUNTER — Encounter: Payer: Self-pay | Admitting: Physician Assistant

## 2023-07-25 DIAGNOSIS — I350 Nonrheumatic aortic (valve) stenosis: Secondary | ICD-10-CM

## 2023-07-25 DIAGNOSIS — Z953 Presence of xenogenic heart valve: Secondary | ICD-10-CM

## 2023-07-25 LAB — ECHOCARDIOGRAM COMPLETE
AR max vel: 0.85 cm2
AV Area VTI: 0.79 cm2
AV Area mean vel: 0.76 cm2
AV Mean grad: 16 mmHg
AV Peak grad: 28.7 mmHg
Ao pk vel: 2.68 m/s
Area-P 1/2: 2.91 cm2
S' Lateral: 2.19 cm

## 2023-07-28 DIAGNOSIS — R262 Difficulty in walking, not elsewhere classified: Secondary | ICD-10-CM | POA: Diagnosis not present

## 2023-07-28 DIAGNOSIS — M6281 Muscle weakness (generalized): Secondary | ICD-10-CM | POA: Diagnosis not present

## 2023-07-28 DIAGNOSIS — M5440 Lumbago with sciatica, unspecified side: Secondary | ICD-10-CM | POA: Diagnosis not present

## 2023-07-30 DIAGNOSIS — M5440 Lumbago with sciatica, unspecified side: Secondary | ICD-10-CM | POA: Diagnosis not present

## 2023-07-30 DIAGNOSIS — M6281 Muscle weakness (generalized): Secondary | ICD-10-CM | POA: Diagnosis not present

## 2023-07-30 DIAGNOSIS — R262 Difficulty in walking, not elsewhere classified: Secondary | ICD-10-CM | POA: Diagnosis not present

## 2023-08-04 DIAGNOSIS — M5440 Lumbago with sciatica, unspecified side: Secondary | ICD-10-CM | POA: Diagnosis not present

## 2023-08-04 DIAGNOSIS — R262 Difficulty in walking, not elsewhere classified: Secondary | ICD-10-CM | POA: Diagnosis not present

## 2023-08-04 DIAGNOSIS — M6281 Muscle weakness (generalized): Secondary | ICD-10-CM | POA: Diagnosis not present

## 2023-08-06 DIAGNOSIS — M6281 Muscle weakness (generalized): Secondary | ICD-10-CM | POA: Diagnosis not present

## 2023-08-06 DIAGNOSIS — R262 Difficulty in walking, not elsewhere classified: Secondary | ICD-10-CM | POA: Diagnosis not present

## 2023-08-06 DIAGNOSIS — M5440 Lumbago with sciatica, unspecified side: Secondary | ICD-10-CM | POA: Diagnosis not present

## 2023-08-08 DIAGNOSIS — M6281 Muscle weakness (generalized): Secondary | ICD-10-CM | POA: Diagnosis not present

## 2023-08-08 DIAGNOSIS — M5440 Lumbago with sciatica, unspecified side: Secondary | ICD-10-CM | POA: Diagnosis not present

## 2023-08-08 DIAGNOSIS — R262 Difficulty in walking, not elsewhere classified: Secondary | ICD-10-CM | POA: Diagnosis not present

## 2023-08-11 DIAGNOSIS — M6281 Muscle weakness (generalized): Secondary | ICD-10-CM | POA: Diagnosis not present

## 2023-08-11 DIAGNOSIS — M5440 Lumbago with sciatica, unspecified side: Secondary | ICD-10-CM | POA: Diagnosis not present

## 2023-08-11 DIAGNOSIS — R262 Difficulty in walking, not elsewhere classified: Secondary | ICD-10-CM | POA: Diagnosis not present

## 2023-08-13 DIAGNOSIS — R262 Difficulty in walking, not elsewhere classified: Secondary | ICD-10-CM | POA: Diagnosis not present

## 2023-08-13 DIAGNOSIS — M6281 Muscle weakness (generalized): Secondary | ICD-10-CM | POA: Diagnosis not present

## 2023-08-13 DIAGNOSIS — M5440 Lumbago with sciatica, unspecified side: Secondary | ICD-10-CM | POA: Diagnosis not present

## 2023-08-14 ENCOUNTER — Other Ambulatory Visit: Payer: Self-pay

## 2023-08-14 ENCOUNTER — Encounter (HOSPITAL_BASED_OUTPATIENT_CLINIC_OR_DEPARTMENT_OTHER): Payer: Self-pay | Admitting: Cardiovascular Disease

## 2023-08-14 ENCOUNTER — Other Ambulatory Visit: Payer: Self-pay | Admitting: Internal Medicine

## 2023-08-14 DIAGNOSIS — Z760 Encounter for issue of repeat prescription: Secondary | ICD-10-CM

## 2023-08-15 DIAGNOSIS — R262 Difficulty in walking, not elsewhere classified: Secondary | ICD-10-CM | POA: Diagnosis not present

## 2023-08-15 DIAGNOSIS — M6281 Muscle weakness (generalized): Secondary | ICD-10-CM | POA: Diagnosis not present

## 2023-08-15 DIAGNOSIS — M5440 Lumbago with sciatica, unspecified side: Secondary | ICD-10-CM | POA: Diagnosis not present

## 2023-08-18 DIAGNOSIS — R262 Difficulty in walking, not elsewhere classified: Secondary | ICD-10-CM | POA: Diagnosis not present

## 2023-08-18 DIAGNOSIS — M6281 Muscle weakness (generalized): Secondary | ICD-10-CM | POA: Diagnosis not present

## 2023-08-18 DIAGNOSIS — M5440 Lumbago with sciatica, unspecified side: Secondary | ICD-10-CM | POA: Diagnosis not present

## 2023-08-20 DIAGNOSIS — M6281 Muscle weakness (generalized): Secondary | ICD-10-CM | POA: Diagnosis not present

## 2023-08-20 DIAGNOSIS — M5440 Lumbago with sciatica, unspecified side: Secondary | ICD-10-CM | POA: Diagnosis not present

## 2023-08-20 DIAGNOSIS — R262 Difficulty in walking, not elsewhere classified: Secondary | ICD-10-CM | POA: Diagnosis not present

## 2023-08-22 DIAGNOSIS — M6281 Muscle weakness (generalized): Secondary | ICD-10-CM | POA: Diagnosis not present

## 2023-08-22 DIAGNOSIS — M5440 Lumbago with sciatica, unspecified side: Secondary | ICD-10-CM | POA: Diagnosis not present

## 2023-08-22 DIAGNOSIS — R262 Difficulty in walking, not elsewhere classified: Secondary | ICD-10-CM | POA: Diagnosis not present

## 2023-08-25 DIAGNOSIS — M5440 Lumbago with sciatica, unspecified side: Secondary | ICD-10-CM | POA: Diagnosis not present

## 2023-08-25 DIAGNOSIS — M6281 Muscle weakness (generalized): Secondary | ICD-10-CM | POA: Diagnosis not present

## 2023-08-25 DIAGNOSIS — R262 Difficulty in walking, not elsewhere classified: Secondary | ICD-10-CM | POA: Diagnosis not present

## 2023-08-27 DIAGNOSIS — M6281 Muscle weakness (generalized): Secondary | ICD-10-CM | POA: Diagnosis not present

## 2023-08-27 DIAGNOSIS — R262 Difficulty in walking, not elsewhere classified: Secondary | ICD-10-CM | POA: Diagnosis not present

## 2023-08-27 DIAGNOSIS — M5440 Lumbago with sciatica, unspecified side: Secondary | ICD-10-CM | POA: Diagnosis not present

## 2023-08-29 DIAGNOSIS — R262 Difficulty in walking, not elsewhere classified: Secondary | ICD-10-CM | POA: Diagnosis not present

## 2023-08-29 DIAGNOSIS — M5440 Lumbago with sciatica, unspecified side: Secondary | ICD-10-CM | POA: Diagnosis not present

## 2023-08-29 DIAGNOSIS — M6281 Muscle weakness (generalized): Secondary | ICD-10-CM | POA: Diagnosis not present

## 2023-09-01 DIAGNOSIS — M5440 Lumbago with sciatica, unspecified side: Secondary | ICD-10-CM | POA: Diagnosis not present

## 2023-09-01 DIAGNOSIS — M6281 Muscle weakness (generalized): Secondary | ICD-10-CM | POA: Diagnosis not present

## 2023-09-01 DIAGNOSIS — R262 Difficulty in walking, not elsewhere classified: Secondary | ICD-10-CM | POA: Diagnosis not present

## 2023-09-03 DIAGNOSIS — M6281 Muscle weakness (generalized): Secondary | ICD-10-CM | POA: Diagnosis not present

## 2023-09-03 DIAGNOSIS — R262 Difficulty in walking, not elsewhere classified: Secondary | ICD-10-CM | POA: Diagnosis not present

## 2023-09-03 DIAGNOSIS — M5440 Lumbago with sciatica, unspecified side: Secondary | ICD-10-CM | POA: Diagnosis not present

## 2023-09-05 ENCOUNTER — Ambulatory Visit (HOSPITAL_BASED_OUTPATIENT_CLINIC_OR_DEPARTMENT_OTHER)

## 2023-09-08 ENCOUNTER — Ambulatory Visit (HOSPITAL_BASED_OUTPATIENT_CLINIC_OR_DEPARTMENT_OTHER)

## 2023-09-10 DIAGNOSIS — M6281 Muscle weakness (generalized): Secondary | ICD-10-CM | POA: Diagnosis not present

## 2023-09-10 DIAGNOSIS — R262 Difficulty in walking, not elsewhere classified: Secondary | ICD-10-CM | POA: Diagnosis not present

## 2023-09-10 DIAGNOSIS — M5440 Lumbago with sciatica, unspecified side: Secondary | ICD-10-CM | POA: Diagnosis not present

## 2023-09-11 NOTE — Telephone Encounter (Signed)
 Patient seen by Alben Hue NP 3/10

## 2023-09-12 DIAGNOSIS — M6281 Muscle weakness (generalized): Secondary | ICD-10-CM | POA: Diagnosis not present

## 2023-09-12 DIAGNOSIS — R262 Difficulty in walking, not elsewhere classified: Secondary | ICD-10-CM | POA: Diagnosis not present

## 2023-09-12 DIAGNOSIS — M5440 Lumbago with sciatica, unspecified side: Secondary | ICD-10-CM | POA: Diagnosis not present

## 2023-09-15 DIAGNOSIS — M6281 Muscle weakness (generalized): Secondary | ICD-10-CM | POA: Diagnosis not present

## 2023-09-15 DIAGNOSIS — M5440 Lumbago with sciatica, unspecified side: Secondary | ICD-10-CM | POA: Diagnosis not present

## 2023-09-15 DIAGNOSIS — R262 Difficulty in walking, not elsewhere classified: Secondary | ICD-10-CM | POA: Diagnosis not present

## 2023-09-19 ENCOUNTER — Encounter (HOSPITAL_BASED_OUTPATIENT_CLINIC_OR_DEPARTMENT_OTHER): Payer: Self-pay

## 2023-09-19 ENCOUNTER — Ambulatory Visit (HOSPITAL_BASED_OUTPATIENT_CLINIC_OR_DEPARTMENT_OTHER): Admitting: Pharmacist Clinician (PhC)/ Clinical Pharmacy Specialist

## 2023-09-19 ENCOUNTER — Other Ambulatory Visit: Payer: Self-pay

## 2023-09-19 ENCOUNTER — Other Ambulatory Visit: Payer: Self-pay | Admitting: Internal Medicine

## 2023-09-19 VITALS — BP 110/70 | HR 81 | Ht 61.0 in | Wt 192.0 lb

## 2023-09-19 DIAGNOSIS — M5440 Lumbago with sciatica, unspecified side: Secondary | ICD-10-CM | POA: Diagnosis not present

## 2023-09-19 DIAGNOSIS — I10 Essential (primary) hypertension: Secondary | ICD-10-CM

## 2023-09-19 DIAGNOSIS — R262 Difficulty in walking, not elsewhere classified: Secondary | ICD-10-CM | POA: Diagnosis not present

## 2023-09-19 DIAGNOSIS — M6281 Muscle weakness (generalized): Secondary | ICD-10-CM | POA: Diagnosis not present

## 2023-09-19 NOTE — Progress Notes (Signed)
 Office Visit    Patient Name: Denise [name] Date of Encounter: 09/20/2023  Primary Care Provider:  Roslyn Coombe, MD Primary Cardiologist:  Maudine Sos, MD  Chief Complaint    Hypertension  Significant Past Medical History   HLD 12/23  LDL 89 on atorvastatin  40  OSA On CPAP  TAVR 2018  CAD 1-39% bilateral ICA stenosis  DM 11/24 A1c 6.0 - well controlled    Allergies  Allergen Reactions   Doxazosin  Nausea And Vomiting    Dizziness    Clonidine Hydrochloride Other (See Comments)    Bradycardia   Erythromycin Palpitations   Hydrocodone -Acetaminophen  Nausea Only   Pork-Derived Products Diarrhea and Nausea Only   Rosiglitazone Maleate Swelling    SWELLING REACTION UNSPECIFIED     History of Present Illness    Denise [name] is a 78 y.o. female patient of Dr Theodis Fiscal, in the office today for hypertension evaluation.  She was most recently seen by Dr. Theodis Fiscal in January, at which time her BP was 142/70.  She had a near syncopal episode in 4/24 and was seen by Neomi Banks at that time.  There were no medication changes made at that time and it was though may have been related to aortic valve prosthesis mismatch.  She is currently taking amlodipine , spironolactone  and valsartan .  At her last visit she was working two jobs and only sleeping about 4 hours per night.  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  I increased her valsartan  from 80 mg am and 40 mg pm to 80 mg bid.    Today she returns for follow up. Feels "off", energy levels are lower than should be.  She has noted this in the past, and I suspect it is not medication related.  Her daughter checked her pressure last weekend and found it to be 126/59.   Hasn't had any recent falls, but stumbled a few times.    Blood Pressure Goal:  130/80  Current Medications:  amlodipine  5 mg daily (am), spironolactone  25 mg daily (pm), valsartan  80 mg bid.  Previously tried:   lisinopril  - switched to valsartan    Amlodipine  10 mg - edema   Doxazosin  - nausea, vomiting, dizziness  Family Hx:  maternal aunt with MI, HTN, daughter with HTN  Social Hx:      Tobacco: only 2 years, quit in 1972  Alcohol: occasionally  Caffeine: none  Diet:  no pork, but other proteins - limited somewhat 2/2 dental issues plenty of fruits and vegetables, only occasional sweets, doesn't snack much; TV dinners regularly, tries to get Healthy Choice or other healthier options  Exercise: PT for fall last year, abdominal and back (core)  Home BP readings:  no readings with her today, daughter checked (with her own cuff - RN) at 126/59, pt home meter tends to read 140's/80-90's (Equate wrist monitor)  Adherence Assessment  Do you ever forget to take your medication? [] Yes [x] No  Do you ever skip doses due to side effects? [] Yes [x] No  Do you have trouble affording your medicines? [] Yes [x] No  Are you ever unable to pick up your medication due to transportation difficulties? [] Yes [x] No   Adherence strategy: 7 day minder  Accessory Clinical Findings    Lab Results  Component Value Date   CREATININE 1.02 (H) 07/04/2023   BUN 19 07/04/2023   NA 141 07/04/2023   K 4.0 07/04/2023   CL 105 07/04/2023   CO2 23  07/04/2023   Lab Results  Component Value Date   ALT 17 03/26/2023   AST 21 03/26/2023   ALKPHOS 58 03/26/2023   BILITOT 0.8 03/26/2023   Lab Results  Component Value Date   HGBA1C 6.0 03/26/2023    Home Medications    Current Outpatient Medications  Medication Sig Dispense Refill   albuterol  (PROVENTIL ) (2.5 MG/3ML) 0.083% nebulizer solution Take 3 mLs (2.5 mg total) by nebulization every 6 (six) hours as needed for wheezing or shortness of breath. 75 mL 0   albuterol  (VENTOLIN  HFA) 108 (90 Base) MCG/ACT inhaler INHALE 2 PUFFS BY MOUTH EVERY 6 HOURS AS NEEDED FOR WHEEZING OR  SHORTNESS  OF  BREATH 9 g 2   amLODipine  (NORVASC ) 5 MG tablet Take 1 tablet (5 mg  total) by mouth daily. 90 tablet 3   aspirin  81 MG EC tablet Take 81 mg by mouth daily.     atorvastatin  (LIPITOR) 80 MG tablet Take 1 tablet (80 mg total) by mouth daily. 90 tablet 2   Calcium  Carb-Cholecalciferol  (CALCIUM  600 + D PO) Take 1 tablet by mouth daily.     cetirizine  (EQ ALLERGY RELIEF, CETIRIZINE ,) 10 MG tablet TAKE 1 TABLET BY MOUTH ONCE DAILY AS NEEDED FOR  ALLERGIES 90 tablet 3   cyclobenzaprine  (FLEXERIL ) 10 MG tablet Take 1 tablet by mouth three times daily as needed for muscle spasm 30 tablet 0   famotidine  (PEPCID ) 40 MG tablet TAKE 1 TABLET BY MOUTH AT BEDTIME 90 tablet 3   fluticasone  (FLONASE ) 50 MCG/ACT nasal spray Place 2 sprays into both nostrils daily. 16 g 3   furosemide  (LASIX ) 40 MG tablet Take 1 tablet by mouth twice daily 180 tablet 3   metFORMIN  (GLUCOPHAGE ) 500 MG tablet TAKE 2 TABLETS BY MOUTH TWICE DAILY WITH A MEAL 360 tablet 0   mometasone -formoterol  (DULERA ) 200-5 MCG/ACT AERO Inhale 2 puffs into the lungs 2 (two) times daily. 13 g 11   Multiple Vitamins-Minerals (ALIVE WOMENS 50+) TABS Take 1 tablet by mouth daily.     Omega-3 Fatty Acids (FISH OIL) 1000 MG CAPS Take 1,000 mg by mouth daily.     pantoprazole  (PROTONIX ) 40 MG tablet TAKE 1 TABLET BY MOUTH TWICE DAILY BEFORE A MEAL 180 tablet 3   Polyvinyl Alcohol-Povidone (REFRESH OP) Place 1 drop into both eyes daily as needed (dry eyes).     valsartan  (DIOVAN ) 80 MG tablet Take 1 tablet (80 mg total) by mouth 2 (two) times daily. 180 tablet 2   diclofenac  Sodium (VOLTAREN ) 1 % GEL Apply 4 g topically 4 (four) times daily. (Patient not taking: Reported on 09/19/2023) 100 g 0   gabapentin  (NEURONTIN ) 100 MG capsule Take 1 capsule (100 mg total) by mouth 3 (three) times daily. (Patient not taking: Reported on 09/19/2023) 90 capsule 3   ondansetron  (ZOFRAN -ODT) 4 MG disintegrating tablet Take 1 tablet (4 mg total) by mouth every 8 (eight) hours as needed for nausea or vomiting. (Patient not taking: Reported on  09/19/2023) 12 tablet 0   potassium chloride  (MICRO-K ) 10 MEQ CR capsule TAKE 4 CAPSULES BY MOUTH ONCE DAILY 360 capsule 0   No current facility-administered medications for this visit.        Assessment & Plan    Essential hypertension Assessment: BP is controlled in office BP 110/70 mmHg;  above the goal (<130/80). Home readings higher, but when checked by daughter, was WNL Tolerates current medications well Denies SOB, palpitation, chest pain, headaches,or swelling Unsure if low energy part  of aging or if home BP better than cuff indicates Reiterated the importance of regular exercise and low salt diet   Plan:  Cut spironolactone  to 12.5 mg daily Continue taking amlodipine  5 mg daily (am), valsartan  80 mg bid. Patient to keep record of BP readings with heart rate and report to us  at the next visit Patient to follow up with me in July  Labs ordered today:  none   Donivan Furry PharmD CPP [facility]  [address] [place], [place] [address] [phone]

## 2023-09-19 NOTE — Patient Instructions (Signed)
 Follow up appointment: Friday July 11 at 10:30 am  Take your BP meds as follows:  Cut spironolactone  tablet in half and take 1/2 tablet (12.5 mg) once daily  Continue with all other medications.    Check your blood pressure at home daily and keep record of the readings.  Your blood pressure goal is 130/80  To check your pressure at home you will need to:  1. Sit up in a chair, with feet flat on the floor and back supported. Do not cross your ankles or legs. 2. Rest your left arm so that the cuff is about heart level. If the cuff goes on your upper arm,  then just relax the arm on the table, arm of the chair or your lap. If you have a wrist cuff, we  suggest relaxing your wrist against your chest (think of it as Pledging the Flag with the  wrong arm).  3. Place the cuff snugly around your arm, about 1 inch above the crook of your elbow. The  cords should be inside the groove of your elbow.  4. Sit quietly, with the cuff in place, for about 5 minutes. After that 5 minutes press the power  button to start a reading. 5. Do not talk or move while the reading is taking place.  6. Record your readings on a sheet of paper. Although most cuffs have a memory, it is often  easier to see a pattern developing when the numbers are all in front of you.  7. You can repeat the reading after 1-3 minutes if it is recommended  Make sure your bladder is empty and you have not had caffeine or tobacco within the last 30 min  Always bring your blood pressure log with you to your appointments. If you have not brought your monitor in to be double checked for accuracy, please bring it to your next appointment.  You can find a list of quality blood pressure cuffs at [email or web address]  Important lifestyle changes to control high blood pressure  Intervention  Effect on the BP  Lose extra pounds and watch your waistline Weight loss is one of the most effective lifestyle changes for controlling blood pressure. If  you're overweight or obese, losing even a small amount of weight can help reduce blood pressure. Blood pressure might go down by about 1 millimeter of mercury (mm Hg) with each kilogram (about 2.2 pounds) of weight lost.  Exercise regularly As a general goal, aim for at least 30 minutes of moderate physical activity every day. Regular physical activity can lower high blood pressure by about 5 to 8 mm Hg.  Eat a healthy diet Eating a diet rich in whole grains, fruits, vegetables, and low-fat dairy products and low in saturated fat and cholesterol. A healthy diet can lower high blood pressure by up to 11 mm Hg.  Reduce salt (sodium) in your diet Even a small reduction of sodium in the diet can improve heart health and reduce high blood pressure by about 5 to 6 mm Hg.  Limit alcohol One drink equals 12 ounces of beer, 5 ounces of wine, or 1.5 ounces of 80-proof liquor.  Limiting alcohol to less than one drink a day for women or two drinks a day for men can help lower blood pressure by about 4 mm Hg.   If you have any questions or concerns please use My Chart to send questions or call the office at [phone]

## 2023-09-20 ENCOUNTER — Encounter (HOSPITAL_BASED_OUTPATIENT_CLINIC_OR_DEPARTMENT_OTHER): Payer: Self-pay | Admitting: Pharmacist Clinician (PhC)/ Clinical Pharmacy Specialist

## 2023-09-20 NOTE — Assessment & Plan Note (Signed)
 Assessment: BP is controlled in office BP 110/70 mmHg;  above the goal (<130/80). Home readings higher, but when checked by daughter, was WNL Tolerates current medications well Denies SOB, palpitation, chest pain, headaches,or swelling Unsure if low energy part of aging or if home BP better than cuff indicates Reiterated the importance of regular exercise and low salt diet   Plan:  Cut spironolactone  to 12.5 mg daily Continue taking amlodipine  5 mg daily (am), valsartan  80 mg bid. Patient to keep record of BP readings with heart rate and report to us  at the next visit Patient to follow up with me in July  Labs ordered today:  none

## 2023-09-24 ENCOUNTER — Ambulatory Visit (INDEPENDENT_AMBULATORY_CARE_PROVIDER_SITE_OTHER): Payer: Medicare Other | Admitting: Internal Medicine

## 2023-09-24 ENCOUNTER — Ambulatory Visit: Payer: Self-pay | Admitting: Internal Medicine

## 2023-09-24 ENCOUNTER — Encounter: Payer: Self-pay | Admitting: Internal Medicine

## 2023-09-24 VITALS — BP 126/80 | HR 65 | Temp 98.2°F | Ht 61.0 in | Wt 196.0 lb

## 2023-09-24 DIAGNOSIS — Z7984 Long term (current) use of oral hypoglycemic drugs: Secondary | ICD-10-CM

## 2023-09-24 DIAGNOSIS — I10 Essential (primary) hypertension: Secondary | ICD-10-CM | POA: Diagnosis not present

## 2023-09-24 DIAGNOSIS — E7800 Pure hypercholesterolemia, unspecified: Secondary | ICD-10-CM | POA: Diagnosis not present

## 2023-09-24 DIAGNOSIS — J309 Allergic rhinitis, unspecified: Secondary | ICD-10-CM | POA: Diagnosis not present

## 2023-09-24 DIAGNOSIS — E119 Type 2 diabetes mellitus without complications: Secondary | ICD-10-CM

## 2023-09-24 LAB — BASIC METABOLIC PANEL WITH GFR
BUN: 20 mg/dL (ref 6–23)
CO2: 29 meq/L (ref 19–32)
Calcium: 9.6 mg/dL (ref 8.4–10.5)
Chloride: 108 meq/L (ref 96–112)
Creatinine, Ser: 1.13 mg/dL (ref 0.40–1.20)
GFR: 46.7 mL/min — ABNORMAL LOW (ref >60.00)
Glucose, Bld: 77 mg/dL (ref 70–99)
Potassium: 3.8 meq/L (ref 3.5–5.1)
Sodium: 145 meq/L (ref 135–145)

## 2023-09-24 LAB — LIPID PANEL
Cholesterol: 172 mg/dL (ref 0–200)
HDL: 77.9 mg/dL (ref >39.00)
LDL Cholesterol: 71 mg/dL (ref 0–99)
NonHDL: 93.63
Total CHOL/HDL Ratio: 2
Triglycerides: 111 mg/dL (ref 0.0–149.0)
VLDL: 22.2 mg/dL (ref 0.0–40.0)

## 2023-09-24 LAB — HEPATIC FUNCTION PANEL
ALT: 15 U/L (ref 0–35)
AST: 17 U/L (ref 0–37)
Albumin: 4.6 g/dL (ref 3.5–5.2)
Alkaline Phosphatase: 53 U/L (ref 39–117)
Bilirubin, Direct: 0.1 mg/dL (ref 0.0–0.3)
Total Bilirubin: 0.6 mg/dL (ref 0.2–1.2)
Total Protein: 7.8 g/dL (ref 6.0–8.3)

## 2023-09-24 LAB — HEMOGLOBIN A1C: Hgb A1c MFr Bld: 6.1 % (ref 4.6–6.5)
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
## 2023-09-24 NOTE — Assessment & Plan Note (Signed)
 Lab Results  Component Value Date   LDLCALC 71 09/24/2023   Uncontrolled,, pt to continue current statin liptor 80 mg every day, declines further change tx for now

## 2023-09-24 NOTE — Patient Instructions (Addendum)
 Please have your Shingrix (shingles) shots done at your local pharmacy.  Please remember to call for your yearly eye exam  Please continue all other medications as before, and refills have been done if requested.  Please have the pharmacy call with any other refills you may need.  Please continue your efforts at being more active, low cholesterol diet, and weight control.  You are otherwise up to date with prevention measures today.  Please keep your appointments with your specialists as you may have planned  Please go to the LAB at the blood drawing area for the tests to be done  You will be contacted by phone if any changes need to be made immediately.  Otherwise, you will receive a letter about your results with an explanation, but please check with MyChart first.  Please make an Appointment to return in 6 months, or sooner if needed

## 2023-09-24 NOTE — Assessment & Plan Note (Signed)
 Pt to restart zyrtec  10 every day prn, flonase  asd,  to f/u any worsening symptoms or concerns

## 2023-09-24 NOTE — Assessment & Plan Note (Signed)
 Lab Results  Component Value Date   HGBA1C 6.1 09/24/2023   Stable, pt to continue current medical treatment metformin  1000 bid

## 2023-09-24 NOTE — Progress Notes (Signed)
 The test results show that your current treatment is OK, as the tests are stable.  Please continue the same plan.  There is no other need for change of treatment or further evaluation based on these results, at this time.  thanks

## 2023-09-24 NOTE — Assessment & Plan Note (Signed)
 BP Readings from Last 3 Encounters:  09/24/23 126/80  09/19/23 110/70  07/07/23 138/80   Stable, pt to continue medical treatment diovan  80 mg, norvasc  5 mg qd

## 2023-09-28 ENCOUNTER — Encounter: Payer: Self-pay | Admitting: Physician Assistant

## 2023-09-28 ENCOUNTER — Other Ambulatory Visit: Payer: Self-pay | Admitting: Internal Medicine

## 2023-09-29 ENCOUNTER — Other Ambulatory Visit: Payer: Self-pay

## 2023-09-29 DIAGNOSIS — M6281 Muscle weakness (generalized): Secondary | ICD-10-CM | POA: Diagnosis not present

## 2023-09-29 DIAGNOSIS — M5440 Lumbago with sciatica, unspecified side: Secondary | ICD-10-CM | POA: Diagnosis not present

## 2023-09-29 DIAGNOSIS — R262 Difficulty in walking, not elsewhere classified: Secondary | ICD-10-CM | POA: Diagnosis not present

## 2023-10-01 DIAGNOSIS — M5440 Lumbago with sciatica, unspecified side: Secondary | ICD-10-CM | POA: Diagnosis not present

## 2023-10-01 DIAGNOSIS — R262 Difficulty in walking, not elsewhere classified: Secondary | ICD-10-CM | POA: Diagnosis not present

## 2023-10-01 DIAGNOSIS — M6281 Muscle weakness (generalized): Secondary | ICD-10-CM | POA: Diagnosis not present

## 2023-10-03 DIAGNOSIS — M6281 Muscle weakness (generalized): Secondary | ICD-10-CM | POA: Diagnosis not present

## 2023-10-03 DIAGNOSIS — R262 Difficulty in walking, not elsewhere classified: Secondary | ICD-10-CM | POA: Diagnosis not present

## 2023-10-03 DIAGNOSIS — M5440 Lumbago with sciatica, unspecified side: Secondary | ICD-10-CM | POA: Diagnosis not present

## 2023-10-06 DIAGNOSIS — R262 Difficulty in walking, not elsewhere classified: Secondary | ICD-10-CM | POA: Diagnosis not present

## 2023-10-06 DIAGNOSIS — M6281 Muscle weakness (generalized): Secondary | ICD-10-CM | POA: Diagnosis not present

## 2023-10-06 DIAGNOSIS — M5440 Lumbago with sciatica, unspecified side: Secondary | ICD-10-CM | POA: Diagnosis not present

## 2023-10-08 DIAGNOSIS — R262 Difficulty in walking, not elsewhere classified: Secondary | ICD-10-CM | POA: Diagnosis not present

## 2023-10-08 DIAGNOSIS — M5440 Lumbago with sciatica, unspecified side: Secondary | ICD-10-CM | POA: Diagnosis not present

## 2023-10-08 DIAGNOSIS — M6281 Muscle weakness (generalized): Secondary | ICD-10-CM | POA: Diagnosis not present

## 2023-10-13 DIAGNOSIS — R262 Difficulty in walking, not elsewhere classified: Secondary | ICD-10-CM | POA: Diagnosis not present

## 2023-10-13 DIAGNOSIS — M5440 Lumbago with sciatica, unspecified side: Secondary | ICD-10-CM | POA: Diagnosis not present

## 2023-10-13 DIAGNOSIS — M6281 Muscle weakness (generalized): Secondary | ICD-10-CM | POA: Diagnosis not present

## 2023-10-15 DIAGNOSIS — M6281 Muscle weakness (generalized): Secondary | ICD-10-CM | POA: Diagnosis not present

## 2023-10-15 DIAGNOSIS — R262 Difficulty in walking, not elsewhere classified: Secondary | ICD-10-CM | POA: Diagnosis not present

## 2023-10-15 DIAGNOSIS — M5440 Lumbago with sciatica, unspecified side: Secondary | ICD-10-CM | POA: Diagnosis not present

## 2023-10-16 DIAGNOSIS — S22060A Wedge compression fracture of T7-T8 vertebra, initial encounter for closed fracture: Secondary | ICD-10-CM | POA: Diagnosis not present

## 2023-10-16 DIAGNOSIS — Z6836 Body mass index (BMI) 36.0-36.9, adult: Secondary | ICD-10-CM | POA: Diagnosis not present

## 2023-10-20 DIAGNOSIS — M5440 Lumbago with sciatica, unspecified side: Secondary | ICD-10-CM | POA: Diagnosis not present

## 2023-10-20 DIAGNOSIS — R262 Difficulty in walking, not elsewhere classified: Secondary | ICD-10-CM | POA: Diagnosis not present

## 2023-10-20 DIAGNOSIS — M6281 Muscle weakness (generalized): Secondary | ICD-10-CM | POA: Diagnosis not present

## 2023-10-22 ENCOUNTER — Ambulatory Visit (INDEPENDENT_AMBULATORY_CARE_PROVIDER_SITE_OTHER): Payer: Medicare Other | Admitting: Podiatry

## 2023-10-22 ENCOUNTER — Encounter: Payer: Self-pay | Admitting: Podiatry

## 2023-10-22 DIAGNOSIS — M5440 Lumbago with sciatica, unspecified side: Secondary | ICD-10-CM | POA: Diagnosis not present

## 2023-10-22 DIAGNOSIS — E1142 Type 2 diabetes mellitus with diabetic polyneuropathy: Secondary | ICD-10-CM

## 2023-10-22 DIAGNOSIS — M79674 Pain in right toe(s): Secondary | ICD-10-CM

## 2023-10-22 DIAGNOSIS — B351 Tinea unguium: Secondary | ICD-10-CM | POA: Diagnosis not present

## 2023-10-22 DIAGNOSIS — L84 Corns and callosities: Secondary | ICD-10-CM

## 2023-10-22 DIAGNOSIS — M6281 Muscle weakness (generalized): Secondary | ICD-10-CM | POA: Diagnosis not present

## 2023-10-22 DIAGNOSIS — M79675 Pain in left toe(s): Secondary | ICD-10-CM

## 2023-10-22 DIAGNOSIS — R262 Difficulty in walking, not elsewhere classified: Secondary | ICD-10-CM | POA: Diagnosis not present

## 2023-10-22 NOTE — Progress Notes (Signed)
 Subjective:  Patient ID: Denise [name], female    DOB: 01-26-1946,  MRN: [id number]  78 y.o. female presents at risk foot care with history of diabetic neuropathy and corn(s) dorsal PIPJ of bilateral 5th toes and painful mycotic toenails that are difficult to trim. Painful toenails interfere with ambulation. Aggravating factors include wearing enclosed shoe gear. Pain is relieved with periodic professional debridement. Painful corns are aggravated when weightbearing when wearing enclosed shoe gear. Pain is relieved with periodic professional debridement.  Chief Complaint  Patient presents with   [facility]    Rm#[id number] [facility] last A1c 6.1 08/2023   New problem(s): None   PCP is Norleen Lynwood ORN, MD.  Allergies  Allergen Reactions   Doxazosin  Nausea And Vomiting    Dizziness    Clonidine Hydrochloride Other (See Comments)    Bradycardia   Erythromycin Palpitations   Hydrocodone -Acetaminophen  Nausea Only   Pork-Derived Products Diarrhea and Nausea Only   Rosiglitazone Maleate Swelling    SWELLING REACTION UNSPECIFIED     Review of Systems: Negative except as noted in the HPI.   Objective:  Denise [name] is a pleasant 78 y.o. female obese in NAD. AAO x 3.  Vascular Examination: Vascular status intact b/l with palpable pedal pulses. CFT immediate b/l. Pedal hair present. No edema. No pain with calf compression b/l. Skin temperature gradient WNL b/l. No varicosities noted. No cyanosis or clubbing noted.  Neurological Examination: Pt has subjective symptoms of neuropathy.Sensation grossly intact b/l with 10 gram monofilament. Vibratory sensation intact b/l.  Dermatological Examination: Pedal skin with normal turgor, texture and tone b/l. No open wounds nor interdigital macerations noted. Toenails 1-5 b/l thick, discolored, elongated with subungual debris and pain on dorsal palpation. Hyperkeratotic lesion(s) dorsal PIPJ of bilateral 5th toes.  No erythema, no edema, no drainage, no  fluctuance.  Musculoskeletal Examination: Muscle strength 5/5 to b/l LE.Muscle strength 5/5 to all lower extremity muscle groups bilaterally. Hammertoe(s) b/l 5th toes. Pes planus deformity noted bilateral LE.  Radiographs: None  Last A1c:      Latest Ref Rng & Units 09/24/2023    9:15 AM 03/26/2023    9:47 AM  Hemoglobin A1C  Hemoglobin-A1c 4.6 - 6.5 % 6.1  6.0      Assessment:   1. Pain due to onychomycosis of toenails of both feet   2. Corns   3. Type 2 diabetes mellitus with diabetic polyneuropathy, without long-term current use of insulin  [facility])    Plan:  Consent given for treatment. Patient examined. All patient's and/or POA's questions/concerns addressed on today's visit. Toenails 1-5 debrided in length and girth without incident. Corn(s) dorsal PIPJ of bilateral 5th toes pared with sharp debridement without incident. Continue foot and shoe inspections daily. Monitor blood glucose per PCP/Endocrinologist's recommendations.Continue soft, supportive shoe gear daily. Report any pedal injuries to medical professional. Call office if there are any questions/concerns. Return in about 3 months (around 01/22/2024).  Denise [name], DPM      [facility] LOCATION: [address], [place] [address]                   Office [phone]   [facility] LOCATION: [address] [place], [place] [address] Office [phone]

## 2023-10-27 DIAGNOSIS — M6281 Muscle weakness (generalized): Secondary | ICD-10-CM | POA: Diagnosis not present

## 2023-10-27 DIAGNOSIS — R262 Difficulty in walking, not elsewhere classified: Secondary | ICD-10-CM | POA: Diagnosis not present

## 2023-10-27 DIAGNOSIS — M5440 Lumbago with sciatica, unspecified side: Secondary | ICD-10-CM | POA: Diagnosis not present

## 2023-10-29 DIAGNOSIS — M6281 Muscle weakness (generalized): Secondary | ICD-10-CM | POA: Diagnosis not present

## 2023-10-29 DIAGNOSIS — M5440 Lumbago with sciatica, unspecified side: Secondary | ICD-10-CM | POA: Diagnosis not present

## 2023-10-29 DIAGNOSIS — R262 Difficulty in walking, not elsewhere classified: Secondary | ICD-10-CM | POA: Diagnosis not present

## 2023-11-07 ENCOUNTER — Encounter (HOSPITAL_BASED_OUTPATIENT_CLINIC_OR_DEPARTMENT_OTHER): Payer: Self-pay

## 2023-11-07 ENCOUNTER — Ambulatory Visit (INDEPENDENT_AMBULATORY_CARE_PROVIDER_SITE_OTHER): Admitting: Pharmacist Clinician (PhC)/ Clinical Pharmacy Specialist

## 2023-11-07 VITALS — BP 134/67 | Ht 61.0 in | Wt 195.0 lb

## 2023-11-07 DIAGNOSIS — I10 Essential (primary) hypertension: Secondary | ICD-10-CM

## 2023-11-07 NOTE — Patient Instructions (Addendum)
 Follow up appointment: with Dr. Raford on September 9 at 8 am  Take your BP meds as follows:  amlodipine  5 mg daily (am), spironolactone  25 mg daily (pm), valsartan  80 mg bid.  Check your blood pressure at home daily (if able) and keep record of the readings.  Your blood pressure goal is < 130/80  To check your pressure at home you will need to:  1. Sit up in a chair, with feet flat on the floor and back supported. Do not cross your ankles or legs. 2. Rest your left arm so that the cuff is about heart level. If the cuff goes on your upper arm,  then just relax the arm on the table, arm of the chair or your lap. If you have a wrist cuff, we  suggest relaxing your wrist against your chest (think of it as Pledging the Flag with the  wrong arm).  3. Place the cuff snugly around your arm, about 1 inch above the crook of your elbow. The  cords should be inside the groove of your elbow.  4. Sit quietly, with the cuff in place, for about 5 minutes. After that 5 minutes press the power  button to start a reading. 5. Do not talk or move while the reading is taking place.  6. Record your readings on a sheet of paper. Although most cuffs have a memory, it is often  easier to see a pattern developing when the numbers are all in front of you.  7. You can repeat the reading after 1-3 minutes if it is recommended  Make sure your bladder is empty and you have not had caffeine or tobacco within the last 30 min  Always bring your blood pressure log with you to your appointments. If you have not brought your monitor in to be double checked for accuracy, please bring it to your next appointment.  You can find a list of quality blood pressure cuffs at [email or web address]  Important lifestyle changes to control high blood pressure  Intervention  Effect on the BP  Lose extra pounds and watch your waistline Weight loss is one of the most effective lifestyle changes for controlling blood pressure. If you're  overweight or obese, losing even a small amount of weight can help reduce blood pressure. Blood pressure might go down by about 1 millimeter of mercury (mm Hg) with each kilogram (about 2.2 pounds) of weight lost.  Exercise regularly As a general goal, aim for at least 30 minutes of moderate physical activity every day. Regular physical activity can lower high blood pressure by about 5 to 8 mm Hg.  Eat a healthy diet Eating a diet rich in whole grains, fruits, vegetables, and low-fat dairy products and low in saturated fat and cholesterol. A healthy diet can lower high blood pressure by up to 11 mm Hg.  Reduce salt (sodium) in your diet Even a small reduction of sodium in the diet can improve heart health and reduce high blood pressure by about 5 to 6 mm Hg.  Limit alcohol One drink equals 12 ounces of beer, 5 ounces of wine, or 1.5 ounces of 80-proof liquor.  Limiting alcohol to less than one drink a day for women or two drinks a day for men can help lower blood pressure by about 4 mm Hg.   If you have any questions or concerns please use My Chart to send questions or call the office at [phone]

## 2023-11-07 NOTE — Assessment & Plan Note (Signed)
 Assessment: BP is slightly elevated in office BP 134/67 mmHg;  Tolerates amlodipine  5 mg daily (am), spironolactone  25 mg daily (pm), valsartan  80 mg bid. well, without any side effects Denies SOB, palpitation, chest pain, headaches,or swelling Reiterated the importance of regular exercise and low salt diet   Plan:  Continue taking amlodipine  5 mg daily (am), spironolactone  25 mg daily (pm), valsartan  80 mg bid. Patient to keep record of BP readings with heart rate and report to us  at the next visit Patient to follow up with Dr. Raford in September.  Labs ordered today:  none

## 2023-11-07 NOTE — Progress Notes (Signed)
 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 female patient of Dr Scarce, in the office today for hypertension evaluation.  She was most recently seen by Dr. Scarce in January, at which time her BP was 142/70.  She had a near syncopal episode in 4/24 and was seen by Reche Finder at that time.  There were no medication changes made at that time and it was though may have been related to aortic valve prosthesis mismatch.  She is currently taking amlodipine , spironolactone  and valsartan .  At her last visit she was working two jobs and only sleeping about 4 hours per night.  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  I increased her valsartan  from 80 mg am and 40 mg pm to 80 mg bid.  I most recently saw her in May, BP was well controlled at 110/70.  She reported feeling off with low energy.  We cut spironolactone  to 12.5 mg daily to see if this would help.  Today she returns for follow up.  Since I saw her last she self increased the spironolactone  back to 25 mg daily, as she noted an increase of fluid in her extremities.  Noted that it cleared up to baseline within a few days of resuming the higher dose.   Has not noticed any change in  urinary frequency with the dose back at 25 mg.    Blood Pressure Goal:  130/80  Current Medications:  amlodipine  5 mg daily (am), spironolactone  12.5 mg daily (pm), valsartan  80 mg bid.  Previously tried:  lisinopril  - switched to valsartan    Amlodipine  10 mg - edema   Doxazosin  - nausea, vomiting, dizziness  Family Hx:  maternal aunt with MI, HTN, daughter with HTN  Social Hx:      Tobacco: only 2 years, quit in 1972  Alcohol: occasionally  Caffeine: none  Diet:  no pork, but other proteins - limited somewhat 2/2 dental issues plenty of fruits and vegetables, only occasional sweets, doesn't snack much; TV dinners regularly, tries to get Healthy Choice or other healthier options; cottage cheese and fruit   Exercise: Started new gym, has personal trainer 2x week for 30 minutes, using a list of resistance exercises recommended by her physical therapist.  States trainer is building off those exercises and helping her to increase body tone.    Home BP readings:  only has 3 readings, all 140-144 systolic.  Admits she doesn't take at rest  Adherence Assessment  Do you ever forget to take your medication? [] Yes [x] No  Do you ever skip doses due to side effects? [] Yes [x] No  Do you have trouble affording your medicines? [] Yes [  x]No  Are you ever unable to pick up your medication due to transportation difficulties? [] Yes [x] No   Adherence strategy: 7 day minder  Accessory Clinical Findings    Lab Results  Component Value Date   CREATININE 1.13 09/24/2023   BUN 20 09/24/2023   NA 145 09/24/2023   K 3.8 09/24/2023   CL 108 09/24/2023   CO2 29 09/24/2023   Lab Results  Component Value Date   ALT 15 09/24/2023   AST 17 09/24/2023   ALKPHOS 53 09/24/2023   BILITOT 0.6 09/24/2023   Lab Results  Component Value Date   HGBA1C 6.1 09/24/2023    Home Medications    Current Outpatient Medications  Medication Sig Dispense Refill   albuterol  (PROVENTIL ) (2.5 MG/3ML) 0.083%  nebulizer solution Take 3 mLs (2.5 mg total) by nebulization every 6 (six) hours as needed for wheezing or shortness of breath. 75 mL 0   albuterol  (VENTOLIN  HFA) 108 (90 Base) MCG/ACT inhaler INHALE 2 PUFFS BY MOUTH EVERY 6 HOURS AS NEEDED FOR WHEEZING OR  SHORTNESS  OF  BREATH 9 g 2   amLODipine  (NORVASC ) 5 MG tablet Take 1 tablet (5 mg total) by mouth daily. 90 tablet 3   aspirin  81 MG EC tablet Take 81 mg by mouth daily.     atorvastatin  (LIPITOR) 80 MG tablet Take 1 tablet (80 mg total) by mouth daily. 90 tablet 2   Calcium  Carb-Cholecalciferol  (CALCIUM  600 + D PO) Take 1 tablet by mouth daily.     cetirizine  (EQ ALLERGY RELIEF, CETIRIZINE ,) 10 MG tablet TAKE 1 TABLET BY MOUTH ONCE DAILY AS NEEDED FOR  ALLERGIES 90 tablet 3   cyclobenzaprine  (FLEXERIL ) 10 MG tablet Take 1 tablet by mouth three times daily as needed for muscle spasm 30 tablet 0   diclofenac  Sodium (VOLTAREN ) 1 % GEL Apply 4 g topically 4 (four) times daily. 100 g 0   DULERA  200-5 MCG/ACT AERO INHALE 2 PUFFS BY MOUTH INTO THE LUNGS TWICE DAILY 13 g 0   famotidine  (PEPCID ) 40 MG tablet TAKE 1 TABLET BY MOUTH AT BEDTIME 90 tablet 3   fluticasone  (FLONASE ) 50 MCG/ACT nasal spray Place 2 sprays into both nostrils daily. 16 g 3   furosemide  (LASIX ) 40 MG tablet Take 1 tablet by mouth twice daily 180 tablet 3   gabapentin  (NEURONTIN ) 100 MG capsule Take 1 capsule (100 mg total) by mouth 3 (three) times daily. 90 capsule 3   metFORMIN  (GLUCOPHAGE ) 500 MG tablet TAKE 2 TABLETS BY MOUTH TWICE DAILY WITH A MEAL 360 tablet 0   Multiple Vitamins-Minerals (ALIVE WOMENS 50+) TABS Take 1 tablet by mouth daily.     Omega-3 Fatty Acids (FISH OIL) 1000 MG CAPS Take 1,000 mg by mouth daily.     ondansetron  (ZOFRAN -ODT) 4 MG disintegrating tablet Take 1 tablet (4 mg total) by mouth every 8 (eight) hours as needed for nausea or vomiting. 12 tablet 0   pantoprazole  (PROTONIX ) 40 MG tablet TAKE 1 TABLET BY MOUTH TWICE DAILY BEFORE A MEAL 180 tablet 3    Polyvinyl Alcohol-Povidone (REFRESH OP) Place 1 drop into both eyes daily as needed (dry eyes).     potassium chloride  (MICRO-K ) 10 MEQ CR capsule TAKE 4 CAPSULES BY MOUTH ONCE DAILY 360 capsule 0   valsartan  (DIOVAN ) 80 MG tablet Take 1 tablet (80 mg total) by mouth 2 (two) times daily. 180 tablet 2   No current facility-administered medications for this visit.        Assessment &  Plan    Essential hypertension Assessment: BP is slightly elevated in office BP 134/67 mmHg;  Tolerates amlodipine  5 mg daily (am), spironolactone  25 mg daily (pm), valsartan  80 mg bid. well, without any side effects Denies SOB, palpitation, chest pain, headaches,or swelling Reiterated the importance of regular exercise and low salt diet   Plan:  Continue taking amlodipine  5 mg daily (am), spironolactone  25 mg daily (pm), valsartan  80 mg bid. Patient to keep record of BP readings with heart rate and report to us  at the next visit Patient to follow up with Dr. Raford in September.  Labs ordered today:  none   Allean Mink PharmD CPP [facility]  [address] [place], [place] [address] [phone]

## 2023-11-16 ENCOUNTER — Other Ambulatory Visit: Payer: Self-pay | Admitting: Internal Medicine

## 2023-11-16 DIAGNOSIS — Z760 Encounter for issue of repeat prescription: Secondary | ICD-10-CM

## 2023-12-12 ENCOUNTER — Other Ambulatory Visit: Payer: Self-pay | Admitting: Internal Medicine

## 2024-01-06 ENCOUNTER — Ambulatory Visit (INDEPENDENT_AMBULATORY_CARE_PROVIDER_SITE_OTHER): Admitting: Cardiovascular Disease

## 2024-01-06 ENCOUNTER — Encounter (HOSPITAL_BASED_OUTPATIENT_CLINIC_OR_DEPARTMENT_OTHER): Payer: Self-pay | Admitting: Cardiovascular Disease

## 2024-01-06 VITALS — BP 142/78 | HR 67 | Ht 61.0 in | Wt 192.4 lb

## 2024-01-06 DIAGNOSIS — I2510 Atherosclerotic heart disease of native coronary artery without angina pectoris: Secondary | ICD-10-CM | POA: Diagnosis not present

## 2024-01-06 DIAGNOSIS — Z952 Presence of prosthetic heart valve: Secondary | ICD-10-CM

## 2024-01-06 DIAGNOSIS — I10 Essential (primary) hypertension: Secondary | ICD-10-CM

## 2024-01-06 DIAGNOSIS — E7800 Pure hypercholesterolemia, unspecified: Secondary | ICD-10-CM | POA: Diagnosis not present

## 2024-01-06 DIAGNOSIS — Z5181 Encounter for therapeutic drug level monitoring: Secondary | ICD-10-CM

## 2024-01-06 DIAGNOSIS — I700 Atherosclerosis of aorta: Secondary | ICD-10-CM

## 2024-01-06 MED ORDER — CHLORTHALIDONE 25 MG PO TABS: 25.0000 mg | ORAL_TABLET | Freq: Every day | ORAL | 3 refills | Status: AC

## 2024-01-06 NOTE — Progress Notes (Signed)
 Cardiology Office Note:  .   Date:  01/06/2024  ID:  Denise [name], DOB 1946-01-18, MRN [id number] PCP: Norleen Lynwood ORN, MD  [facility] Providers Cardiologist:  Annabella Scarce, MD    History of Present Illness: .   Denise [name] is a 78 y.o. 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  She was initially seen 07/2016 after her PCP noted a murmur. She was referred for an echo that showed LVEF 55-60% with grade 1 diastolic dysfunction at severe aortic stenosis.  The mean gradient was 48 mmHg.  She had a left heart catheterization 09/04/16 that revealed diffuse calcification and mild non-obstructive coronary artery disease.  She also had carotid Dopplers that revealed 1-39% bilateral ICA stenosis.   She underwent successful placement of a TAVR bioprothesis 09/24/16 with Dr. Wonda.  Her echo 09/25/16 revealed LVEF 60-65% and a well-functioning aortic valve prosthesis. There is trivial AR and mild MR.  However, she had a follow-up echo 10/17/16 that revealed a significant elevation of the aortic valve gradients (mean gradient 36 mmHg.  Given that the leaflets showed normal mobility, this was thought to be due to patient-prosthesis mismatch given that she has a small aortic root/annulus.  She had a cardiac CT that showed that the aortic valve was functioning well with no evidence of pannus or thrombus. Denise [name] had a repeat echo 08/2017 that showed the mean gradient across her TAVR was down to 27 mmHg. She also had carotid Dopplers that revealed 1-39% bilateral ICA stenosis.  Denise [name]'s BP was elevated and lisinopril  was increased to 40mg  daily.  Doxazosin  was added but discontinued due to nausea, vomiting, and dizziness.  Denise [name]'s blood pressure was still elevated.  She wanted to work on diet  and exercise prior to changing her medications.  A plan was made to switch lisinopril  to vosartan if it remains elevated at follow-up.     On 01/12/21 she underwent decompressive laminectomy interbody fusion L1-L2. She saw her PCP 04/17/21 and her blood pressure was 158/80. However, she reported lower readings at home. Valsartan  was changed to 20mg  bid. She reported edema but her blood pressure was controlled. Amlodipine  was reduced, and valsartan  was increased. She was referred for a sleep study and was found to have sleep apnea. CPAP was recommended. She had carotid dopplers 09/2021 with stable mild stenosis bilaterally.   At her visit 01/2022 she reported fatigue which she attributed to working too much.  We discussed increasing exercise and increased valsartan .  She saw Denise Finder, NP 07/2022 with near syncope.  She was not orthostatic in the office.  Repeat echo 08/2022 revealed LVEF 53% with moderate LVH.  Mean aortic valve gradient was 17 mmHg.  08/2023 she had a fall with compression fractures.  She had to stop the PREP program after a fall.  At her visit 04/2023 blood pressures were uncontrolled.  Amlodipine  was increased to 10 mg.  This was reduced to 5 mg and spironolactone  was added.    ?spironolactone  Discussed the use of AI scribe software for clinical note transcription with the patient, who gave verbal consent to proceed.  History of Present Illness Denise [name] is a 78 year old female with hypertension and sleep apnea who presents for blood pressure management and CPAP issues.  She has been experiencing difficulty in achieving consistent blood pressure control, with  home readings ranging from 130/78 to 149/unknown. Her current medication regimen includes spironolactone  25 mg, amlodipine  5 mg, and valsartan  80 mg twice daily. The amlodipine  dose was decreased due to swelling, and valsartan  was increased.  She has a history of sleep apnea and is experiencing issues with her CPAP  mask staying on during sleep, leading to dry mouth upon waking. She has tried various adjustments, including changing her sleeping position and using a special pillow, but the mask often comes off. Her work schedule, which includes night shifts, affects her sleep pattern, and she reports feeling more energetic when she gets adequate sleep with the CPAP.  She has a history of compression fractures in her spine from a fall, for which she completed physical therapy. She has since returned to regular exercise at a gym with a personal trainer. She feels good during exercise but has some limitations due to previous arm surgery and arthritis.  She mentions a decrease in appetite since her fall and is currently using plant-based protein shakes to maintain nutrition. She is on pantoprazole  for reflux but feels it may not be effective.  She has a history of aortic valve replacement with a TAVR procedure and is on baby aspirin  for valve maintenance. She is also on atorvastatin  80 mg for cholesterol management, with her LDL recorded at 71 in May.  She reports some varicose vein issues with occasional pain and describes a sensation of calcification or scar tissue in the vein.    ROS:  As per HPI  Studies Reviewed: .       Echo 07/25/23: 1. Left ventricular ejection fraction, by estimation, is 70 to 75%. The  left ventricle has hyperdynamic function. The left ventricle has no  regional wall motion abnormalities. There is mild concentric left  ventricular hypertrophy. Left ventricular  diastolic parameters are consistent with Grade I diastolic dysfunction  (impaired relaxation). Elevated left atrial pressure.   2. Right ventricular systolic function is normal. The right ventricular  size is normal.   3. Left atrial size was mildly dilated.   4. The mitral valve is normal in structure. Trivial mitral valve  regurgitation. No evidence of mitral stenosis. Moderate mitral annular  calcification.   5. The  aortic valve has been repaired/replaced. Aortic valve  regurgitation is trivial. No aortic stenosis is present. There is a 23 mm  Sapien prosthetic (TAVR) valve present in the aortic position. Procedure  Date: 09/24/16.   6. The inferior vena cava is normal in size with greater than 50%  respiratory variability, suggesting right atrial pressure of 3 mmHg.   Risk Assessment/Calculations:     HYPERTENSION CONTROL Vitals:   01/06/24 0850 01/06/24 0910  BP: 130/78 (!) 142/78    The patient's blood pressure is elevated above target today.  In order to address the patient's elevated BP: A new medication was prescribed today.      STOP-Bang Score:         Physical Exam:   VS:  BP (!) 142/78   Pulse 67   Ht 5' 1 (1.549 m)   Wt 192 lb 6.4 oz (87.3 kg)   SpO2 99%   BMI 36.35 kg/m  , BMI Body mass index is 36.35 kg/m. GENERAL:  Well appearing HEENT: Pupils equal round and reactive, fundi not visualized, oral mucosa unremarkable NECK:  No jugular venous distention, waveform within normal limits, carotid upstroke brisk and symmetric, no bruits, no thyromegaly LUNGS:  Clear to auscultation bilaterally HEART:  RRR.  PMI  not displaced or sustained,S1 and S2 within normal limits, no S3, no S4, no clicks, no rubs, III/VI systolic murmur at the LUSB ABD:  Flat, positive bowel sounds normal in frequency in pitch, no bruits, no rebound, no guarding, no midline pulsatile mass, no hepatomegaly, no splenomegaly EXT:  2 plus pulses throughout, no edema, no cyanosis no clubbing SKIN:  No rashes no nodules NEURO:  Cranial nerves II through XII grossly intact, motor grossly intact throughout PSYCH:  Cognitively intact, oriented to person place and time   ASSESSMENT AND PLAN: .    Assessment & Plan # Primary hypertension Blood pressure at home ranged from 130-149/78 mmHg. Adjusted valsartan  to 80 mg twice daily and decreased amlodipine  due to swelling. Discussed switching from Lasix  to  chlorthalidone  for better blood pressure control and fluid management.  Continue spironolactone  25mg  daily.  - Switch Lasix  to chlorthalidone  25 mg daily. - Order basic metabolic panel in one week to monitor potassium levels. - Instruct to check blood pressure twice daily and record readings. - Follow up in two months to assess blood pressure control and medication efficacy.  # Atherosclerotic heart disease of native coronary artery without angina On atorvastatin  80 mg with LDL at 71 mg/dL. No angina symptoms. - Continue atorvastatin  80 mg daily. - Continue aspirin   # Presence of prosthetic aortic valve (TAVR) Prosthetic aortic valve functioning with patient prosthesis mismatch causing a murmur. Leaflets move fine. Valve slightly small for body size, no functional issues anticipated.  She is asymptomatic.  - Repeat echocardiogram 06/2024 - Continue baby aspirin  for valve maintenance.  # Pure hypercholesterolemia Cholesterol managed with atorvastatin  80 mg. LDL well-controlled at 71 mg/dL. - Continue atorvastatin  80 mg daily.  # OSA: Keep follow up with sleep medicine.           Dispo: f/u in 2 months for BP.  F/u with me after echo 3/206.  Signed, Annabella Scarce, MD

## 2024-01-06 NOTE — Patient Instructions (Addendum)
 Medication Instructions:  STOP FUROSEMIDE    START CHLORTHALIDONE  25 MG DAILY   *If you need a refill on your cardiac medications before your next appointment, please call your pharmacy*  Lab Work: BMET IN 1 WEEK   If you have labs (blood work) drawn today and your tests are completely normal, you will receive your results only by: MyChart Message (if you have MyChart) OR A paper copy in the mail If you have any lab test that is abnormal or we need to change your treatment, we will call you to review the results.  Testing/Procedures: Your physician has requested that you have an echocardiogram. Echocardiography is a painless test that uses sound waves to create images of your heart. It provides your doctor with information about the size and shape of your heart and how well your heart's chambers and valves are working. This procedure takes approximately one hour. There are no restrictions for this procedure. Please do NOT wear cologne, perfume, aftershave, or lotions (deodorant is allowed). Please arrive 15 minutes prior to your appointment time.  Please note: We ask at that you not bring children with you during ultrasound (echo/ vascular) testing. Due to room size and safety concerns, children are not allowed in the ultrasound rooms during exams. Our front office staff cannot provide observation of children in our lobby area while testing is being conducted. An adult accompanying a patient to their appointment will only be allowed in the ultrasound room at the discretion of the ultrasound technician under special circumstances. We apologize for any inconvenience.  TO BE DONE AFTER 07/24/2024  Follow-Up: At [facility], you and your health needs are our priority.  As part of our continuing mission to provide you with exceptional heart care, our providers are all part of one team.  This team includes your primary Cardiologist (physician) and Advanced Practice Providers or APPs  (Physician Assistants and Nurse Practitioners) who all work together to provide you with the care you need, when you need it.  Your next appointment:   ABOUT A WEEK AFTER YOUR ECHO WITH DR Anderson   FOLLOW UP WITH DR [facility] 03/05/2024 AT 11:40 AM

## 2024-01-12 DIAGNOSIS — H34211 Partial retinal artery occlusion, right eye: Secondary | ICD-10-CM | POA: Diagnosis not present

## 2024-01-12 DIAGNOSIS — Z961 Presence of intraocular lens: Secondary | ICD-10-CM | POA: Diagnosis not present

## 2024-01-12 DIAGNOSIS — E119 Type 2 diabetes mellitus without complications: Secondary | ICD-10-CM | POA: Diagnosis not present

## 2024-01-12 DIAGNOSIS — H524 Presbyopia: Secondary | ICD-10-CM | POA: Diagnosis not present

## 2024-01-12 LAB — HM DIABETES EYE EXAM

## 2024-01-13 ENCOUNTER — Encounter: Payer: Self-pay | Admitting: Ophthalmology

## 2024-01-13 DIAGNOSIS — Z952 Presence of prosthetic heart valve: Secondary | ICD-10-CM | POA: Diagnosis not present

## 2024-01-13 DIAGNOSIS — Z5181 Encounter for therapeutic drug level monitoring: Secondary | ICD-10-CM | POA: Diagnosis not present

## 2024-01-13 DIAGNOSIS — H34211 Partial retinal artery occlusion, right eye: Secondary | ICD-10-CM

## 2024-01-13 DIAGNOSIS — I10 Essential (primary) hypertension: Secondary | ICD-10-CM | POA: Diagnosis not present

## 2024-01-13 LAB — BASIC METABOLIC PANEL WITH GFR
BUN/Creatinine Ratio: 12 (ref 12–28)
BUN: 15 mg/dL (ref 8–27)
CO2: 22 mmol/L (ref 20–29)
Calcium: 10 mg/dL (ref 8.7–10.3)
Chloride: 102 mmol/L (ref 96–106)
Creatinine, Ser: 1.21 mg/dL — ABNORMAL HIGH (ref 0.57–1.00)
Glucose: 75 mg/dL (ref 70–99)
Potassium: 4.3 mmol/L (ref 3.5–5.2)
Sodium: 140 mmol/L (ref 134–144)
eGFR: 46 mL/min/1.73 — ABNORMAL LOW (ref >59)

## 2024-01-15 ENCOUNTER — Other Ambulatory Visit: Payer: Self-pay | Admitting: Neurosurgery

## 2024-01-15 DIAGNOSIS — M25552 Pain in left hip: Secondary | ICD-10-CM | POA: Diagnosis not present

## 2024-01-15 DIAGNOSIS — S22060A Wedge compression fracture of T7-T8 vertebra, initial encounter for closed fracture: Secondary | ICD-10-CM

## 2024-01-19 ENCOUNTER — Other Ambulatory Visit: Payer: Self-pay | Admitting: Neurosurgery

## 2024-01-19 DIAGNOSIS — R262 Difficulty in walking, not elsewhere classified: Secondary | ICD-10-CM

## 2024-01-19 DIAGNOSIS — S22060A Wedge compression fracture of T7-T8 vertebra, initial encounter for closed fracture: Secondary | ICD-10-CM

## 2024-01-19 DIAGNOSIS — M25552 Pain in left hip: Secondary | ICD-10-CM

## 2024-01-21 ENCOUNTER — Encounter: Payer: Self-pay | Admitting: Primary Care

## 2024-01-21 ENCOUNTER — Telehealth: Payer: Self-pay | Admitting: Primary Care

## 2024-01-21 ENCOUNTER — Ambulatory Visit (INDEPENDENT_AMBULATORY_CARE_PROVIDER_SITE_OTHER): Admitting: Primary Care

## 2024-01-21 VITALS — BP 136/86 | HR 83 | Temp 97.4°F | Ht 61.0 in | Wt 195.4 lb

## 2024-01-21 DIAGNOSIS — J45909 Unspecified asthma, uncomplicated: Secondary | ICD-10-CM | POA: Diagnosis not present

## 2024-01-21 DIAGNOSIS — K219 Gastro-esophageal reflux disease without esophagitis: Secondary | ICD-10-CM | POA: Diagnosis not present

## 2024-01-21 DIAGNOSIS — Z23 Encounter for immunization: Secondary | ICD-10-CM | POA: Diagnosis not present

## 2024-01-21 DIAGNOSIS — G4733 Obstructive sleep apnea (adult) (pediatric): Secondary | ICD-10-CM

## 2024-01-21 MED ORDER — BUDESONIDE-FORMOTEROL FUMARATE 160-4.5 MCG/ACT IN AERO: 2.0000 | INHALATION_SPRAY | Freq: Two times a day (BID) | RESPIRATORY_TRACT | 6 refills | Status: AC

## 2024-01-21 NOTE — Progress Notes (Signed)
 @Patient  ID: Denise [name], female    DOB: 05-06-1945, 78 y.o.   MRN: [id number]  Chief Complaint  Patient presents with   Asthma    No complaints    Obstructive Sleep Apnea    Struggles to keep her CPAP on.     Referring provider: Norleen Lynwood ORN, MD  HPI: 78 year old female, former smoker. PMH significant for HTN, coronary artery calcification, severe aortic stenosis s/p TAVR, OSA, asthma, allergic rhinitis, dysphagia, GERD, type 2 diabetes, hyperlipidemia.   01/21/2024 Discussed the use of AI scribe software for clinical note transcription with the patient, who gave verbal consent to proceed.  History of Present Illness Denise [name] is a 78 year old female with mild to moderate obstructive sleep apnea who presents for a routine follow-up.  She experiences ongoing challenges with CPAP therapy, frequently waking up with a dry mouth and finding the mask has shifted. She reports feeling more energetic and less foggy when she uses her CPAP consistently, but she continues to struggle with mask fit issues. 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  She has a history of asthma and currently uses Dulera  (two puffs twice a day) after switching from Symbicort  due to insurance coverage. She feels Symbicort  was more effective. She occasionally uses a rescue inhaler, especially when she feels a cold coming on, which can worsen her asthma. She reports chest congestion and a persistent sensation of her ears being 'stopped up'.  She has a history of reflux and is on Protonix  and Pepcid . She takes Protonix  twice daily but is uncertain about its effectiveness when taken at night. She also uses Flonase  and Zyrtec  daily for sinus issues but continues to feel congested and experiences a 'gargling' sensation when speaking. Despite treatment, she feels her sinuses are not  clear.      Allergies  Allergen Reactions   Doxazosin  Nausea And Vomiting    Dizziness    Clonidine Hydrochloride Other (See Comments)    Bradycardia   Erythromycin Palpitations   Hydrocodone -Acetaminophen  Nausea Only   Pork-Derived Products Diarrhea and Nausea Only   Rosiglitazone Maleate Swelling    SWELLING REACTION UNSPECIFIED     Immunization History  Administered Date(s) Administered   Fluad Quad(high Dose 65+) 03/04/2019, 04/13/2020, 04/17/2021, 01/18/2022   Fluad Trivalent(High Dose 65+) 03/26/2023   Influenza,inj,Quad PF,6+ Mos 03/25/2017   Influenza-Unspecified 10/13/2019, 10/17/2021   PFIZER(Purple Top)SARS-COV-2 Vaccination 06/25/2019, 07/22/2019, 03/11/2020, 11/05/2020, 07/11/2021   Pneumococcal Conjugate-13 02/08/2014   Pneumococcal Polysaccharide-23 09/28/2006, 07/29/2012, 02/15/2015   Td 09/12/2008   Tdap 10/20/2018    Past Medical History:  Diagnosis Date   Abdominal pain, left lower quadrant 09/12/2008   ALLERGIC RHINITIS 08/24/2007   Allergy    Anemia    past hx- not current    ANXIETY 08/24/2007   Aortic atherosclerosis 04/13/2020   Aortic stenosis, severe 07/19/2016   ASTHMA 08/24/2007   ASTHMA, WITH ACUTE EXACERBATION 03/14/2008   Breast cancer (HCC) 2017   right- radiation only    Cataract    removed bilat    DDD (degenerative disc disease), lumbar    DEGENERATIVE JOINT DISEASE 08/24/2007   DEPRESSION 08/24/2007   DIABETES MELLITUS, TYPE II 08/24/2007   ECZEMA 08/24/2007   Edema 08/24/2007   GERD 08/24/2007   not current (07/2014)- not current 2019   Heart murmur    HYPERCHOLESTEROLEMIA 08/24/2007   HYPERLIPIDEMIA 08/24/2007   HYPERTENSION 08/24/2007  Lower extremity edema 07/13/2021   Neuromuscular disorder (HCC)    numbness in feet - still has feeling   OBESITY 08/24/2007   OSTEOARTHRITIS, KNEES, BILATERAL, 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   Personal history of radiation therapy 2017   right breast ca   POSTMENOPAUSAL STATUS 08/24/2007    Right knee DJD 09/03/2010   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   23 mm Edwards Sapien 3 transcatheter heart valve placed via right percutaneous transfemoral approach   Snoring 07/13/2021   SPINAL STENOSIS 08/24/2007    Tobacco History: Social History   Tobacco Use  Smoking Status Former   Current packs/day: 0.00   Average packs/day: 0.5 packs/day for 2.0 years (1.0 ttl pk-yrs)   Types: Cigarettes   Start date: 3   Quit date: 1971   Years since quitting: 54.7  Smokeless Tobacco Never   Counseling given: Not Answered   Outpatient Medications Prior to Visit  Medication Sig Dispense Refill   albuterol  (PROVENTIL ) (2.5 MG/3ML) 0.083% nebulizer solution Take 3 mLs (2.5 mg total) by nebulization every 6 (six) hours as needed for wheezing or shortness of breath. 75 mL 0   albuterol  (VENTOLIN  HFA) 108 (90 Base) MCG/ACT inhaler INHALE 2 PUFFS BY MOUTH EVERY 6 HOURS AS NEEDED FOR WHEEZING OR  SHORTNESS  OF  BREATH 9 g 2   amLODipine  (NORVASC ) 5 MG tablet Take 1 tablet (5 mg total) by mouth daily. 90 tablet 3   aspirin  81 MG EC tablet Take 81 mg by mouth daily.     atorvastatin  (LIPITOR) 80 MG tablet Take 1 tablet (80 mg total) by mouth daily. 90 tablet 2   Calcium  Carb-Cholecalciferol  (CALCIUM  600 + D PO) Take 1 tablet by mouth daily.     cetirizine  (EQ ALLERGY RELIEF, CETIRIZINE ,) 10 MG tablet TAKE 1 TABLET BY MOUTH ONCE DAILY AS NEEDED FOR  ALLERGIES 90 tablet 3   chlorthalidone  (HYGROTON ) 25 MG tablet Take 1 tablet (25 mg total) by mouth daily. 90 tablet 3   cyclobenzaprine  (FLEXERIL ) 10 MG tablet Take 1 tablet by mouth three times daily as needed for muscle spasm 30 tablet 0   diclofenac  Sodium (VOLTAREN ) 1 % GEL Apply 4 g topically 4 (four) times daily. 100 g 0   DULERA  200-5 MCG/ACT AERO INHALE 2 PUFFS BY MOUTH INTO THE LUNGS TWICE DAILY 13 g 0   famotidine  (PEPCID ) 40 MG tablet TAKE 1 TABLET BY MOUTH AT BEDTIME 90 tablet 3   fluticasone  (FLONASE ) 50  MCG/ACT nasal spray Place 2 sprays into both nostrils daily. 16 g 3   gabapentin  (NEURONTIN ) 100 MG capsule Take 1 capsule (100 mg total) by mouth 3 (three) times daily. 90 capsule 3   metFORMIN  (GLUCOPHAGE ) 500 MG tablet TAKE 2 TABLETS BY MOUTH TWICE DAILY WITH A MEAL 360 tablet 0   Multiple Vitamins-Minerals (ALIVE WOMENS 50+) TABS Take 1 tablet by mouth daily.     Omega-3 Fatty Acids (FISH OIL) 1000 MG CAPS Take 1,000 mg by mouth daily.     ondansetron  (ZOFRAN -ODT) 4 MG disintegrating tablet Take 1 tablet (4 mg total) by mouth every 8 (eight) hours as needed for nausea or vomiting. 12 tablet 0   pantoprazole  (PROTONIX ) 40 MG tablet TAKE 1 TABLET BY MOUTH TWICE DAILY BEFORE A MEAL 180 tablet 3   Polyvinyl Alcohol-Povidone (REFRESH OP) Place 1 drop into both eyes daily as needed (dry eyes).     potassium chloride  (MICRO-K ) 10 MEQ CR capsule TAKE 4 CAPSULES BY MOUTH ONCE DAILY 360 capsule  0   spironolactone  (ALDACTONE ) 25 MG tablet Take 25 mg by mouth daily.     valsartan  (DIOVAN ) 80 MG tablet Take 1 tablet (80 mg total) by mouth 2 (two) times daily. 180 tablet 2   No facility-administered medications prior to visit.   Review of Systems  Review of Systems  Constitutional:  Positive for fatigue.  HENT:  Positive for congestion.   Respiratory:  Positive for cough.   Psychiatric/Behavioral:  Positive for sleep disturbance.    Physical Exam  BP 136/86   Pulse 83   Temp (!) 97.4 F (36.3 C)   Ht 5' 1 (1.549 m)   Wt 195 lb 6.4 oz (88.6 kg)   SpO2 96% Comment: RA  BMI 36.92 kg/m  Physical Exam Constitutional:      General: She is not in acute distress.    Appearance: Normal appearance. She is not ill-appearing.  HENT:     Head: Normocephalic and atraumatic.     Mouth/Throat:     Mouth: Mucous membranes are moist.     Pharynx: Oropharynx is clear.  Cardiovascular:     Rate and Rhythm: Normal rate and regular rhythm.     Heart sounds: Murmur heard.  Pulmonary:     Effort:  Pulmonary effort is normal.     Breath sounds: Normal breath sounds. No wheezing, rhonchi or rales.  Musculoskeletal:        General: Normal range of motion.  Skin:    General: Skin is warm and dry.  Neurological:     General: No focal deficit present.     Mental Status: She is alert and oriented to person, place, and time. Mental status is at baseline.  Psychiatric:        Mood and Affect: Mood normal.        Behavior: Behavior normal.        Thought Content: Thought content normal.        Judgment: Judgment normal.      Lab Results:  CBC    Component Value Date/Time   WBC 9.1 07/04/2023 1646   RBC 4.35 07/04/2023 1646   HGB 11.6 (L) 07/04/2023 1646   HGB 13.5 10/09/2021 1039   HGB 13.8 09/27/2015 0840   HCT 36.1 07/04/2023 1646   HCT 41.3 10/09/2021 1039   HCT 42.7 09/27/2015 0840   PLT 207 07/04/2023 1646   PLT 221 10/09/2021 1039   MCV 83.0 07/04/2023 1646   MCV 80 10/09/2021 1039   MCV 82.9 09/27/2015 0840   MCH 26.7 07/04/2023 1646   MCHC 32.1 07/04/2023 1646   RDW 15.3 07/04/2023 1646   RDW 15.5 (H) 10/09/2021 1039   RDW 15.3 (H) 09/27/2015 0840   LYMPHSABS 2.8 10/13/2022 0313   LYMPHSABS 3.5 (H) 09/27/2015 0840   MONOABS 0.8 10/13/2022 0313   MONOABS 0.6 09/27/2015 0840   EOSABS 0.2 10/13/2022 0313   EOSABS 0.3 09/27/2015 0840   BASOSABS 0.1 10/13/2022 0313   BASOSABS 0.1 09/27/2015 0840    BMET    Component Value Date/Time   NA 140 01/13/2024 0940   NA 145 09/27/2015 0840   K 4.3 01/13/2024 0940   K 3.7 09/27/2015 0840   CL 102 01/13/2024 0940   CO2 22 01/13/2024 0940   CO2 27 09/27/2015 0840   GLUCOSE 75 01/13/2024 0940   GLUCOSE 77 09/24/2023 0915   GLUCOSE 136 09/27/2015 0840   BUN 15 01/13/2024 0940   BUN 8.8 09/27/2015 0840   CREATININE 1.21 (H) 01/13/2024  0940   CREATININE 0.8 09/27/2015 0840   CALCIUM  10.0 01/13/2024 0940   CALCIUM  9.2 09/27/2015 0840   GFRNONAA 57 (L) 07/04/2023 1646   GFRAA 95 03/07/2020 0843    BNP     Component Value Date/Time   BNP 82.0 09/23/2022 0745    ProBNP    Component Value Date/Time   PROBNP 22.0 01/06/2012 1706    Imaging: No results found.   Assessment & Plan:   1. Uncomplicated asthma, unspecified asthma severity, unspecified whether persistent (Primary)  2. OSA (obstructive sleep apnea)  Assessment and Plan Assessment & Plan Obstructive sleep apnea Mild to moderate obstructive sleep apnea with issues related to CPAP mask fit and compliance. Reports dry mouth and air leaks, indicating poor mask fit. Benefits from CPAP use but struggles with consistent use due to mask fit issues. Current settings 5-15cm h20 with residual AHI 1.2/hour, indicating good control.  - Order desensitization mask fitting in the sleep lab to address mask fit issues. - Instruct to use CPAP during all sleep periods, including naps, to improve cognitive functioning and overall sleep quality.  Asthma Asthma with reported congestion and suboptimal control on current medication regimen. Previously on Advair and Symbicort , now on Dulera  due to insurance coverage issues. Reports better control with Symbicort . Symbicort  is preferred due to better symptom control. - Send prescription for generic Symbicort  (budesonide /formoterol ) 160-4.5mcg two puffs every 12 hours   Allergic rhinitis Allergic rhinitis with symptoms of nasal congestion and sinus issues. Currently using Flonase  and Zyrtec  daily. Persistent symptoms may require further evaluation. - Continue daily use of Flonase  and Zyrtec . - Consider referral to allergist for allergy testing if symptoms persist.  Gastroesophageal reflux disease Gastroesophageal reflux disease with symptoms of throat soreness and ear fullness. Previously evaluated by ENT and diagnosed with upper and lower reflux. Currently on Protonix  and Pepcid . Symptoms may be exacerbated by shift work and inconsistent medication timing. - Continue Protonix  and Pepcid  as  prescribed. - Advise to take Protonix  consistently, timing does not need to be at night.  Almarie LELON Ferrari, NP 01/21/2024

## 2024-01-21 NOTE — Patient Instructions (Addendum)
  VISIT SUMMARY: Today, you came in for a routine follow-up appointment to discuss your obstructive sleep apnea, asthma, allergic rhinitis, and gastroesophageal reflux disease. We reviewed your current symptoms and treatment plans to ensure you are receiving the best care possible.  YOUR PLAN: -OBSTRUCTIVE SLEEP APNEA: Obstructive sleep apnea is a condition where your airway becomes blocked during sleep, causing breathing interruptions. You are experiencing issues with your CPAP mask fit, leading to dry mouth and air leaks. We will arrange a desensitization mask fitting in the sleep lab to address these issues. Please use your CPAP during all sleep periods, including naps, to improve your cognitive functioning and overall sleep quality.  -ASTHMA: Asthma is a condition that causes your airways to become inflamed and narrow, making it difficult to breathe. You reported better control with Symbicort  compared to Dulera . We will submit a prior authorization for Symbicort  and send a prescription for generic Symbicort  to [facility]. Please stop using Dulera  and start Symbicort  once you pick it up.  -ALLERGIC RHINITIS: Allergic rhinitis is an allergic reaction that causes sneezing, congestion, and a runny nose. You are currently using Flonase  and Zyrtec  daily. If your symptoms persist, we may refer you to an allergist for further evaluation.  -GASTROESOPHAGEAL REFLUX DISEASE: Gastroesophageal reflux disease (GERD) is a condition where stomach acid frequently flows back into the tube connecting your mouth and stomach, causing irritation. You are currently on Protonix  and Pepcid . Please continue taking these medications as prescribed, and note that Protonix  does not need to be taken at night.  INSTRUCTIONS: We will arrange a desensitization mask fitting in the sleep lab for your CPAP mask. Please use your CPAP during all sleep periods, including naps. We will submit a prior authorization for Symbicort  and send a  prescription for generic Symbicort  to [facility]. Stop using Dulera  and start Symbicort  once you pick it up. If your allergic rhinitis symptoms persist, we may refer you to an allergist for further evaluation. Continue taking Protonix  and Pepcid  as prescribed, and remember that Protonix  does not need to be taken at night.  Follow-up 6 months with Landry NP

## 2024-01-21 NOTE — Telephone Encounter (Signed)
 Tried and failed Advair and dulera   Rx symbicort  sent to pharmacy

## 2024-01-22 ENCOUNTER — Other Ambulatory Visit (HOSPITAL_COMMUNITY): Payer: Self-pay

## 2024-01-28 ENCOUNTER — Encounter (HOSPITAL_BASED_OUTPATIENT_CLINIC_OR_DEPARTMENT_OTHER): Payer: Self-pay | Admitting: Cardiovascular Disease

## 2024-01-29 ENCOUNTER — Ambulatory Visit: Payer: Self-pay | Admitting: Cardiovascular Disease

## 2024-02-03 DIAGNOSIS — M25511 Pain in right shoulder: Secondary | ICD-10-CM | POA: Insufficient documentation

## 2024-02-04 ENCOUNTER — Ambulatory Visit: Admitting: Podiatry

## 2024-02-04 ENCOUNTER — Other Ambulatory Visit (HOSPITAL_BASED_OUTPATIENT_CLINIC_OR_DEPARTMENT_OTHER)

## 2024-02-04 DIAGNOSIS — L84 Corns and callosities: Secondary | ICD-10-CM | POA: Diagnosis not present

## 2024-02-04 DIAGNOSIS — M79674 Pain in right toe(s): Secondary | ICD-10-CM | POA: Diagnosis not present

## 2024-02-04 DIAGNOSIS — M79675 Pain in left toe(s): Secondary | ICD-10-CM | POA: Diagnosis not present

## 2024-02-04 DIAGNOSIS — E1142 Type 2 diabetes mellitus with diabetic polyneuropathy: Secondary | ICD-10-CM

## 2024-02-04 DIAGNOSIS — B351 Tinea unguium: Secondary | ICD-10-CM | POA: Diagnosis not present

## 2024-02-08 ENCOUNTER — Encounter: Payer: Self-pay | Admitting: Podiatry

## 2024-02-08 NOTE — Progress Notes (Signed)
  Subjective:  Patient ID: Denise [name], female    DOB: [birth year],  MRN: [id number]  Denise [name] presents to clinic today for at risk foot care with history of diabetic neuropathy and corn(s) of both feet and painful mycotic toenails that are difficult to trim. Painful toenails interfere with ambulation. Aggravating factors include wearing enclosed shoe gear. Pain is relieved with periodic professional debridement. Painful corns are aggravated when weightbearing when wearing enclosed shoe gear. Pain is relieved with periodic professional debridement.  Chief Complaint  Patient presents with   Diabetes    DFC A1C 6.1. Toenail trim. LOV with PCP 09/24/23. Bilateral 5th toe calluses.   New problem(s): None.   PCP is Norleen Lynwood ORN, MD.  Allergies  Allergen Reactions   Doxazosin  Nausea And Vomiting    Dizziness    Clonidine Hydrochloride Other (See Comments)    Bradycardia   Erythromycin Palpitations   Hydrocodone -Acetaminophen  Nausea Only   Porcine (Pork) Protein-Containing Drug Products Diarrhea and Nausea Only   Rosiglitazone Maleate Swelling    SWELLING REACTION UNSPECIFIED     Review of Systems: Negative except as noted in the HPI.  Objective: No changes noted in today's physical examination. There were no vitals filed for this visit. Denise [name] is a pleasant 78 y.o. female obese in NAD. AAO x 3.  Vascular Examination: Vascular status intact b/l with palpable pedal pulses. CFT immediate b/l. Pedal hair present. No edema. No pain with calf compression b/l. Skin temperature gradient WNL b/l. No varicosities noted. No cyanosis or clubbing noted.  Neurological Examination: Pt has subjective symptoms of neuropathy.Sensation grossly intact b/l with 10 gram monofilament. Vibratory sensation intact b/l.  Dermatological Examination: Pedal skin with normal turgor, texture and tone b/l. No open wounds nor interdigital macerations noted. Toenails 1-5 b/l thick,  discolored, elongated with subungual debris and pain on dorsal palpation. Hyperkeratotic lesion(s) dorsal PIPJ of bilateral 5th toes.  No erythema, no edema, no drainage, no fluctuance.  Musculoskeletal Examination: Muscle strength 5/5 to b/l LE.Muscle strength 5/5 to all lower extremity muscle groups bilaterally. Hammertoe(s) b/l 5th toes. Pes planus deformity noted bilateral LE.  Radiographs: None  Assessment/Plan: 1. Pain due to onychomycosis of toenails of both feet   2. Corns   3. Type 2 diabetes mellitus with diabetic polyneuropathy, without long-term current use of insulin  [facility])   Patient was evaluated and treated. All patient's and/or POA's questions/concerns addressed on today's visit. Mycotic toenails 1-5 debrided in length and girth without incident. Corn(s) dorsal PIPJ of bilateral 5th toes pared with sharp debridement without incident. Treatment was provided by assistant Andrez Manchester under my supervision. Continue daily foot inspections and monitor blood glucose per PCP/Endocrinologist's recommendations. Continue soft, supportive shoe gear daily. Report any pedal injuries to medical professional. Call office if there are any questions/concerns.  Return in about 3 months (around 05/06/2024).  Delon LITTIE Merlin, DPM       LOCATION: [address] [place] [address]                   Office [phone]   [facility] LOCATION: [address] [place], [place] [address] Office [phone]

## 2024-02-10 ENCOUNTER — Other Ambulatory Visit

## 2024-02-10 ENCOUNTER — Inpatient Hospital Stay: Admission: RE | Admit: 2024-02-10 | Source: Ambulatory Visit

## 2024-02-11 ENCOUNTER — Ambulatory Visit
Admission: RE | Admit: 2024-02-11 | Discharge: 2024-02-11 | Disposition: A | Discharge status: Home / Self Care | Source: Ambulatory Visit | Attending: Neurosurgery | Admitting: Neurosurgery

## 2024-02-11 DIAGNOSIS — M25552 Pain in left hip: Secondary | ICD-10-CM

## 2024-02-11 DIAGNOSIS — M1612 Unilateral primary osteoarthritis, left hip: Secondary | ICD-10-CM | POA: Diagnosis not present

## 2024-02-11 DIAGNOSIS — R262 Difficulty in walking, not elsewhere classified: Secondary | ICD-10-CM

## 2024-02-16 ENCOUNTER — Ambulatory Visit (HOSPITAL_BASED_OUTPATIENT_CLINIC_OR_DEPARTMENT_OTHER): Attending: Primary Care | Admitting: Radiology

## 2024-02-16 ENCOUNTER — Ambulatory Visit
Admission: RE | Admit: 2024-02-16 | Discharge: 2024-02-16 | Disposition: A | Source: Ambulatory Visit | Attending: Neurosurgery | Admitting: Neurosurgery

## 2024-02-16 DIAGNOSIS — S22060A Wedge compression fracture of T7-T8 vertebra, initial encounter for closed fracture: Secondary | ICD-10-CM | POA: Diagnosis not present

## 2024-02-16 DIAGNOSIS — G4733 Obstructive sleep apnea (adult) (pediatric): Secondary | ICD-10-CM

## 2024-02-17 ENCOUNTER — Ambulatory Visit (HOSPITAL_BASED_OUTPATIENT_CLINIC_OR_DEPARTMENT_OTHER)
Admission: RE | Admit: 2024-02-17 | Discharge: 2024-02-17 | Disposition: A | Discharge status: Home / Self Care | Source: Ambulatory Visit | Attending: Ophthalmology | Admitting: Ophthalmology

## 2024-02-17 DIAGNOSIS — I6523 Occlusion and stenosis of bilateral carotid arteries: Secondary | ICD-10-CM | POA: Diagnosis not present

## 2024-02-17 DIAGNOSIS — H34211 Partial retinal artery occlusion, right eye: Secondary | ICD-10-CM | POA: Diagnosis not present

## 2024-02-17 DIAGNOSIS — I10 Essential (primary) hypertension: Secondary | ICD-10-CM | POA: Diagnosis not present

## 2024-02-17 DIAGNOSIS — R55 Syncope and collapse: Secondary | ICD-10-CM | POA: Diagnosis not present

## 2024-02-18 DIAGNOSIS — S22060A Wedge compression fracture of T7-T8 vertebra, initial encounter for closed fracture: Secondary | ICD-10-CM | POA: Diagnosis not present

## 2024-02-21 ENCOUNTER — Emergency Department (HOSPITAL_BASED_OUTPATIENT_CLINIC_OR_DEPARTMENT_OTHER)
Admission: EM | Admit: 2024-02-21 | Discharge: 2024-02-21 | Disposition: A | Discharge status: Home / Self Care | Attending: Emergency Medicine | Admitting: Emergency Medicine

## 2024-02-21 ENCOUNTER — Other Ambulatory Visit: Payer: Self-pay

## 2024-02-21 ENCOUNTER — Encounter (HOSPITAL_BASED_OUTPATIENT_CLINIC_OR_DEPARTMENT_OTHER): Payer: Self-pay

## 2024-02-21 DIAGNOSIS — R509 Fever, unspecified: Secondary | ICD-10-CM | POA: Diagnosis present

## 2024-02-21 DIAGNOSIS — G4733 Obstructive sleep apnea (adult) (pediatric): Secondary | ICD-10-CM | POA: Insufficient documentation

## 2024-02-21 DIAGNOSIS — Z7982 Long term (current) use of aspirin: Secondary | ICD-10-CM | POA: Insufficient documentation

## 2024-02-21 DIAGNOSIS — B349 Viral infection, unspecified: Secondary | ICD-10-CM | POA: Diagnosis not present

## 2024-02-21 DIAGNOSIS — Z952 Presence of prosthetic heart valve: Secondary | ICD-10-CM | POA: Insufficient documentation

## 2024-02-21 DIAGNOSIS — E119 Type 2 diabetes mellitus without complications: Secondary | ICD-10-CM | POA: Insufficient documentation

## 2024-02-21 DIAGNOSIS — J45909 Unspecified asthma, uncomplicated: Secondary | ICD-10-CM | POA: Insufficient documentation

## 2024-02-21 DIAGNOSIS — J029 Acute pharyngitis, unspecified: Secondary | ICD-10-CM | POA: Diagnosis present

## 2024-02-21 LAB — CBC WITH DIFFERENTIAL/PLATELET
Abs Immature Granulocytes: 0.01 K/uL (ref 0.00–0.07)
Basophils Absolute: 0.1 K/uL (ref 0.0–0.1)
Basophils Relative: 1 %
Eosinophils Absolute: 0.2 K/uL (ref 0.0–0.5)
Eosinophils Relative: 3 %
HCT: 30.7 % — ABNORMAL LOW (ref 36.0–46.0)
Hemoglobin: 10.1 g/dL — ABNORMAL LOW (ref 12.0–15.0)
Immature Granulocytes: 0 %
Lymphocytes Relative: 25 %
Lymphs Abs: 1.8 K/uL (ref 0.7–4.0)
MCH: 27.5 pg (ref 26.0–34.0)
MCHC: 32.9 g/dL (ref 30.0–36.0)
MCV: 83.7 fL (ref 80.0–100.0)
Monocytes Absolute: 0.6 K/uL (ref 0.1–1.0)
Monocytes Relative: 9 %
Neutro Abs: 4.5 K/uL (ref 1.7–7.7)
Neutrophils Relative %: 62 %
Platelets: 193 K/uL (ref 150–400)
RBC: 3.67 MIL/uL — ABNORMAL LOW (ref 3.87–5.11)
RDW: 15.7 % — ABNORMAL HIGH (ref 11.5–15.5)
WBC: 7.1 K/uL (ref 4.0–10.5)
nRBC: 0 % (ref 0.0–0.2)

## 2024-02-21 LAB — RESP PANEL BY RT-PCR (RSV, FLU A&B, COVID)  RVPGX2
Influenza A by PCR: NEGATIVE
Influenza B by PCR: NEGATIVE
Resp Syncytial Virus by PCR: NEGATIVE
SARS Coronavirus 2 by RT PCR: NEGATIVE

## 2024-02-21 LAB — BASIC METABOLIC PANEL WITH GFR
Anion gap: 10 (ref 5–15)
BUN: 20 mg/dL (ref 8–23)
CO2: 24 mmol/L (ref 22–32)
Calcium: 9.6 mg/dL (ref 8.9–10.3)
Chloride: 108 mmol/L (ref 98–111)
Creatinine, Ser: 1.19 mg/dL — ABNORMAL HIGH (ref 0.44–1.00)
GFR, Estimated: 47 mL/min — ABNORMAL LOW (ref >60)
Glucose, Bld: 86 mg/dL (ref 70–99)
Potassium: 4.1 mmol/L (ref 3.5–5.1)
Sodium: 141 mmol/L (ref 135–145)

## 2024-02-21 LAB — GROUP A STREP BY PCR: Group A Strep by PCR: NOT DETECTED

## 2024-02-21 MED ORDER — DEXAMETHASONE 4 MG PO TABS
6.0000 mg | ORAL_TABLET | Freq: Once | ORAL | Status: AC
  Administered 2024-02-21: 6 mg via ORAL
  Filled 2024-02-21: qty 2

## 2024-02-21 NOTE — ED Triage Notes (Signed)
 Arrives POV with complaints of sore throat and eye drainage x1 day.

## 2024-02-21 NOTE — ED Provider Notes (Signed)
 Denise [name] EMERGENCY DEPARTMENT AT [facility] Provider Note   CSN: [id number] Arrival date & time: 02/21/24  9075     Patient presents with: Sore Throat and Eye Drainage   Denise [name] is a 78 y.o. female.   78 year old female presenting with sore throat, generalized weakness.  Patient notes that yesterday she woke up with a scratchy throat, she did not think much of this and thought that she may be coming down with a virus, however as the day went on yesterday she began to feel more symptomatic with a complaint of sore throat/weakness, she took Tylenol  last night which did help alleviate her symptoms, she also experienced a subjective fever last night.  She woke up this morning and felt that her symptoms had worsened, with sore throat, tearing of both eyes, swollen lymph nodes in her neck, nasal congestion, and generalized weakness.  History of OSA, she was unable to use her CPAP last night due to sore throat. She denies vision changes, difficulty swallowing, nausea/vomiting/diarrhea, cough, chest pain, shortness of breath.   Sore Throat       Prior to Admission medications   Medication Sig Start Date End Date Taking? Authorizing Provider  albuterol  (PROVENTIL ) (2.5 MG/3ML) 0.083% nebulizer solution Take 3 mLs (2.5 mg total) by nebulization every 6 (six) hours as needed for wheezing or shortness of breath. 09/23/22   Armenta Canning, MD  albuterol  (VENTOLIN  HFA) 108 (90 Base) MCG/ACT inhaler INHALE 2 PUFFS BY MOUTH EVERY 6 HOURS AS NEEDED FOR WHEEZING OR  SHORTNESS  OF  BREATH 08/01/22   Norleen Lynwood ORN, MD  amLODipine  (NORVASC ) 5 MG tablet Take 1 tablet (5 mg total) by mouth daily. 06/12/23 02/04/24  Vannie Reche RAMAN, NP  aspirin  81 MG EC tablet Take 81 mg by mouth daily.    [provider]  atorvastatin  (LIPITOR) 80 MG tablet Take 1 tablet (80 mg total) by mouth daily. 07/17/23   Lucien Orren SAILOR, PA-C  budesonide -formoterol  (SYMBICORT ) 160-4.5 MCG/ACT inhaler  Inhale 2 puffs into the lungs 2 (two) times daily. 01/21/24   Hope Almarie ORN, NP  Calcium  Carb-Cholecalciferol  (CALCIUM  600 + D PO) Take 1 tablet by mouth daily.    [provider]  cetirizine  (EQ ALLERGY RELIEF, CETIRIZINE ,) 10 MG tablet TAKE 1 TABLET BY MOUTH ONCE DAILY AS NEEDED FOR  ALLERGIES 10/20/18   Norleen Lynwood ORN, MD  chlorthalidone  (HYGROTON ) 25 MG tablet Take 1 tablet (25 mg total) by mouth daily. 01/06/24 04/05/24  Raford Riggs, MD  cyclobenzaprine  (FLEXERIL ) 10 MG tablet Take 1 tablet by mouth three times daily as needed for muscle spasm 11/20/22   Purcell Emil Schanz, MD  diclofenac  Sodium (VOLTAREN ) 1 % GEL Apply 4 g topically 4 (four) times daily. 01/10/20   Emil Share, DO  famotidine  (PEPCID ) 40 MG tablet TAKE 1 TABLET BY MOUTH AT BEDTIME 03/07/23   Aneita Gwendlyn DASEN, MD  fluticasone  (FLONASE ) 50 MCG/ACT nasal spray Place 2 sprays into both nostrils daily. 07/31/21   Norleen Lynwood ORN, MD  gabapentin  (NEURONTIN ) 100 MG capsule Take 1 capsule (100 mg total) by mouth 3 (three) times daily. 10/21/22   Norleen Lynwood ORN, MD  metFORMIN  (GLUCOPHAGE ) 500 MG tablet TAKE 2 TABLETS BY MOUTH TWICE DAILY WITH A MEAL 11/17/23   Norleen Lynwood ORN, MD  Multiple Vitamins-Minerals (ALIVE WOMENS 50+) TABS Take 1 tablet by mouth daily.    [provider]  Omega-3 Fatty Acids (FISH OIL) 1000 MG CAPS Take 1,000 mg by mouth daily.  [provider]  ondansetron  (ZOFRAN -ODT) 4 MG disintegrating tablet Take 1 tablet (4 mg total) by mouth every 8 (eight) hours as needed for nausea or vomiting. 07/04/23   Yolande Lamar BROCKS, MD  pantoprazole  (PROTONIX ) 40 MG tablet TAKE 1 TABLET BY MOUTH TWICE DAILY BEFORE A MEAL 03/07/23   Aneita Gwendlyn DASEN, MD  Polyvinyl Alcohol-Povidone (REFRESH OP) Place 1 drop into both eyes daily as needed (dry eyes).    [provider]  potassium chloride  (MICRO-K ) 10 MEQ CR capsule TAKE 4 CAPSULES BY MOUTH ONCE DAILY 12/15/23   Norleen Lynwood ORN, MD  spironolactone   (ALDACTONE ) 25 MG tablet Take 25 mg by mouth daily.    [provider]  valsartan  (DIOVAN ) 80 MG tablet Take 1 tablet (80 mg total) by mouth 2 (two) times daily. 07/01/23   Raford Riggs, MD    Allergies: Doxazosin , Clonidine hydrochloride, Erythromycin, Hydrocodone -acetaminophen , Porcine (pork) protein-containing drug products, and Rosiglitazone maleate    Review of Systems  Updated Vital Signs  Vitals:   02/21/24 0930 02/21/24 0930  BP:  (!) 156/79  Pulse:  77  Resp:  16  Temp:  98.3 F (36.8 C)  SpO2:  98%  Weight: 88.5 kg   Height: 5' 1 (1.549 m)      Physical Exam Vitals and nursing note reviewed.  HENT:     Head: Normocephalic.     Mouth/Throat:     Comments: Multiple missing teeth Mild erythema of posterior oropharynx No appreciable tonsillar swelling or exudate, uvula is midline, no evidence of abscess formation Normal phonation, sublingual space is without obvious swelling, normal protrusion of tongue Eyes:     Extraocular Movements: Extraocular movements intact.  Cardiovascular:     Rate and Rhythm: Normal rate and regular rhythm.     Heart sounds: Murmur heard.  Pulmonary:     Effort: Pulmonary effort is normal.     Breath sounds: Normal breath sounds.  Musculoskeletal:     Cervical back: Normal range of motion and neck supple. No rigidity.     Comments: Moves all extremities spontaneously without difficulty  Lymphadenopathy:     Cervical: No cervical adenopathy.  Skin:    General: Skin is warm and dry.  Neurological:     Mental Status: She is alert and oriented to person, place, and time.     (all labs ordered are listed, but only abnormal results are displayed) Labs Reviewed  CBC WITH DIFFERENTIAL/PLATELET - Abnormal; Notable for the following components:      Result Value   RBC 3.67 (*)    Hemoglobin 10.1 (*)    HCT 30.7 (*)    RDW 15.7 (*)    All other components within normal limits  BASIC METABOLIC PANEL WITH GFR - Abnormal;  Notable for the following components:   Creatinine, Ser 1.19 (*)    GFR, Estimated 47 (*)    All other components within normal limits  RESP PANEL BY RT-PCR (RSV, FLU A&B, COVID)  [id number]  GROUP A STREP BY PCR    EKG: None  Radiology: No results found.   Procedures   Medications Ordered in the ED  dexamethasone  (DECADRON ) tablet 6 mg (has no administration in time range)                                    Medical Decision Making This patient presents to the ED for concern of sore throat/tearing/congestion/weakness, this  involves an extensive number of treatment options, and is a complaint that carries with it a high risk of complications and morbidity.  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  The pertinent results include:  strep PCR negative. CBC notable for hemoglobin of 10.1, this has trended down from her most recent of 11.6 from 7 months ago.  BMP with creatinine of 1.19, this is largely consistent with patient's most recent baseline.  COVID/flu/RSV negative.   Cardiac Monitoring: / EKG:  The patient was maintained on a cardiac monitor.  I personally viewed and interpreted the cardiac monitored which showed an underlying rhythm of: NSR  Problem List / ED Course / Critical interventions / Medication management   I ordered medication including decadron  for sore throat  Reevaluation of the patient after these medicines showed that the patient improved I have reviewed the patients home medicines and have made adjustments as needed   Social Determinants of Health:  Former tobacco use,  financial instability   Test / Admission - Considered:  Physical exam is largely unremarkable as above, patient does have some mild erythema to the posterior oropharynx, however no appreciable swelling/exudate or concern for abscess formation, patient with normal phonation and no sublingual space swelling, low suspicion for peritonsillar abscess/retropharyngeal abscess/Ludwig's angina at this time based on these reassuring findings.  Patient denies cough or shortness of breath, therefore I did not proceed with chest x-ray.  Labs are notable as above, viral testing is negative, patient does have a downward trend in her hemoglobin, I discussed this with her and she notes that she has been told she was anemic in the past, she denies melena/hematochezia, I recommend that she discuss this with her PCP at her follow-up visit. I suspect the patient's symptoms are secondary to a viral illness.  I recommend that she continue Tylenol  and follow-up with her PCP this week if her symptoms persist, strict return precautions discussed.  She voiced understanding and is in agreement with this plan, she is appropriate for discharge at this time.  Staffed with Dr. Bernard  Amount and/or Complexity of Data Reviewed Labs: ordered.  Risk Prescription drug management.        Final diagnoses:  Viral illness    ED Discharge Orders     None          Denise [name] 02/21/24 1154    Bernard Drivers, MD 02/23/24 [phone]

## 2024-02-21 NOTE — Discharge Instructions (Signed)
 Continue Tylenol  as needed for pain/fever.  Follow-up with your PCP next week if your symptoms persist.  Return to the emergency department if your symptoms worsen.

## 2024-02-23 ENCOUNTER — Ambulatory Visit: Payer: Self-pay | Admitting: Family Medicine

## 2024-02-23 ENCOUNTER — Ambulatory Visit (INDEPENDENT_AMBULATORY_CARE_PROVIDER_SITE_OTHER): Admitting: Family Medicine

## 2024-02-23 ENCOUNTER — Encounter: Payer: Self-pay | Admitting: Family Medicine

## 2024-02-23 ENCOUNTER — Other Ambulatory Visit: Payer: Self-pay | Admitting: Internal Medicine

## 2024-02-23 ENCOUNTER — Ambulatory Visit

## 2024-02-23 VITALS — BP 150/76 | HR 74 | Temp 98.0°F | Ht 61.0 in | Wt 194.6 lb

## 2024-02-23 DIAGNOSIS — R051 Acute cough: Secondary | ICD-10-CM

## 2024-02-23 DIAGNOSIS — R059 Cough, unspecified: Secondary | ICD-10-CM | POA: Diagnosis not present

## 2024-02-23 DIAGNOSIS — J22 Unspecified acute lower respiratory infection: Secondary | ICD-10-CM | POA: Diagnosis not present

## 2024-02-23 DIAGNOSIS — Z760 Encounter for issue of repeat prescription: Secondary | ICD-10-CM

## 2024-02-23 DIAGNOSIS — Z981 Arthrodesis status: Secondary | ICD-10-CM | POA: Diagnosis not present

## 2024-02-23 DIAGNOSIS — R0602 Shortness of breath: Secondary | ICD-10-CM | POA: Diagnosis not present

## 2024-02-23 DIAGNOSIS — R918 Other nonspecific abnormal finding of lung field: Secondary | ICD-10-CM | POA: Diagnosis not present

## 2024-02-23 LAB — POCT INFLUENZA A/B
Influenza A, POC: NEGATIVE
Influenza B, POC: NEGATIVE

## 2024-02-23 LAB — POC COVID19 BINAXNOW: SARS Coronavirus 2 Ag: NEGATIVE

## 2024-02-23 MED ORDER — PREDNISONE 10 MG (21) PO TBPK: ORAL_TABLET | Freq: Every day | ORAL | 0 refills | Status: AC

## 2024-02-23 MED ORDER — AZITHROMYCIN 250 MG PO TABS: ORAL_TABLET | ORAL | 0 refills | Status: AC

## 2024-02-23 NOTE — Telephone Encounter (Signed)
 Spoke with patient and relayed results. She is asking if you will be sending in Doxycycline ?  Call back at [phone]

## 2024-02-23 NOTE — Progress Notes (Unsigned)
 Acute Office Visit  Subjective:     Patient ID: Denise [name], female    DOB: August 05, 1945, 78 y.o.   MRN: [id number]  Chief Complaint  Patient presents with   Hospitalization Follow-up    HPI  Discussed the use of AI scribe software for clinical note transcription with the patient, who gave verbal consent to proceed.  History of Present Illness 78 yo female with asthma who presents with respiratory symptoms and wheezing.  Respiratory symptoms - Onset of symptoms after returning home on Saturday - Difficulty breathing, gagging, and inability to pull air into lungs - Wheezing present - Breathing improved slightly by Saturday afternoon - Severe cough causing imbalance and headache - Sore throat present - Chills present, no temperature measured - Neck soreness and puffiness, especially with palpation - Post-nasal drip without discoloration  Asthma management and exacerbation - Uses Symbicort  twice daily - Uses albuterol  nebulizer, initially every two hours on Saturday, then every four hours as symptoms improved - Received dexamethasone  in the emergency room with temporary relief - Typically receives a steroid taper and antibiotics during symptom flare-ups  Occupational exposure - 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  Constitutional:      General: She is not in acute distress.    Comments: Appears fatigued  HENT:     Head: Normocephalic and atraumatic.     Right Ear: External ear  normal.     Left Ear: External ear normal.     Nose: Nose normal.     Mouth/Throat:     Mouth: Mucous membranes are moist.     Pharynx: Oropharynx is clear.  Eyes:     Extraocular Movements: Extraocular movements intact.     Pupils: Pupils are equal, round, and reactive to light.  Cardiovascular:     Rate and Rhythm: Normal rate and regular rhythm.     Pulses: Normal pulses.     Heart sounds: Normal heart sounds.  Pulmonary:     Effort: Pulmonary effort is normal. No respiratory distress.     Breath sounds: Rales (Right lower lobe) present. No wheezing or rhonchi.     Comments: Moderate productive cough in office Musculoskeletal:        General: Normal range of motion.     Cervical back: Normal range of motion.     Right lower leg: No edema.     Left lower leg: No edema.  Lymphadenopathy:     Cervical: No cervical adenopathy.  Neurological:     General: No focal deficit present.     Mental Status: She is alert and oriented to person, place, and time.  Psychiatric:        Mood and Affect: Mood normal.        Thought Content: Thought content normal.     Results for orders placed or performed in visit on 02/23/24  POC COVID-19 BinaxNow  Result Value Ref Range  SARS Coronavirus 2 Ag Negative Negative  POCT Influenza A/B  Result Value Ref Range   Influenza A, POC Negative Negative   Influenza B, POC Negative Negative        Assessment & Plan:   Assessment and Plan Assessment & Plan Lower respiratory infection, acute cough  Negative for flu and COVID-19. Managed with steroids and inhalers, nebulizers. - Order chest x-ray for right lower lung. - Prescribe azithromycin : 2 tablets today, then 1 tablet daily for 4 days. - Prescribe prednisone  taper. - Advise use of albuterol  nebulizer as needed. - Advise rest and hydration.     Orders Placed This Encounter  Procedures   DG Chest 2 View    Standing Status:   Future    Number of Occurrences:   1    Expiration  Date:   08/23/2024    Reason for Exam (SYMPTOM  OR DIAGNOSIS REQUIRED):   cough, SOB, r/o pneumonia    Preferred imaging location?:   [facility]   POC COVID-19 BinaxNow   POCT Influenza A/B     Meds ordered this encounter  Medications   predniSONE  (STERAPRED UNI-PAK 21 TAB) 10 MG (21) TBPK tablet    Sig: Take by mouth daily for 6 days. Take 6 tablets on day 1, 5 tablets on day 2, 4 tablets on day 3, 3 tablets on day 4, 2 tablets on day 5, 1 tablet on day 6    Dispense:  21 tablet    Refill:  0   azithromycin  (ZITHROMAX ) 250 MG tablet    Sig: Take 2 tablets on day 1, then 1 tablet daily on days 2 through 5    Dispense:  6 tablet    Refill:  0    Return if symptoms worsen or fail to improve.  Corean LITTIE Ku, FNP

## 2024-02-23 NOTE — Progress Notes (Signed)
 Attempted to reach patient, no.

## 2024-02-23 NOTE — Patient Instructions (Signed)
 I have sent in azithromycin  for you to take.  Take 2 tablets today, then 1 tablet daily for the next 4 days.  Take by mouth daily for 6 days. Take 6 tablets on day 1, 5 tablets on day 2, 4 tablets on day 3, 3 tablets on day 4, 2 tablets on day 5, 1 tablet on day 6   We are getting an xray today. We will be in contact with any abnormal results that require further attention.  Follow-up with me for new or worsening symptoms.

## 2024-02-24 ENCOUNTER — Other Ambulatory Visit: Payer: Self-pay

## 2024-03-02 DIAGNOSIS — M25552 Pain in left hip: Secondary | ICD-10-CM | POA: Diagnosis not present

## 2024-03-02 DIAGNOSIS — S22060A Wedge compression fracture of T7-T8 vertebra, initial encounter for closed fracture: Secondary | ICD-10-CM | POA: Diagnosis not present

## 2024-03-02 DIAGNOSIS — Z6837 Body mass index (BMI) 37.0-37.9, adult: Secondary | ICD-10-CM | POA: Diagnosis not present

## 2024-03-03 ENCOUNTER — Encounter (HOSPITAL_BASED_OUTPATIENT_CLINIC_OR_DEPARTMENT_OTHER): Payer: Self-pay

## 2024-03-05 ENCOUNTER — Encounter (HOSPITAL_BASED_OUTPATIENT_CLINIC_OR_DEPARTMENT_OTHER): Payer: Self-pay

## 2024-03-05 ENCOUNTER — Encounter (HOSPITAL_BASED_OUTPATIENT_CLINIC_OR_DEPARTMENT_OTHER): Payer: Self-pay | Admitting: Cardiovascular Disease

## 2024-03-05 ENCOUNTER — Ambulatory Visit (INDEPENDENT_AMBULATORY_CARE_PROVIDER_SITE_OTHER): Admitting: Cardiovascular Disease

## 2024-03-05 VITALS — BP 122/64 | HR 86 | Ht 61.0 in | Wt 196.6 lb

## 2024-03-05 DIAGNOSIS — I2510 Atherosclerotic heart disease of native coronary artery without angina pectoris: Secondary | ICD-10-CM

## 2024-03-05 DIAGNOSIS — I10 Essential (primary) hypertension: Secondary | ICD-10-CM | POA: Diagnosis not present

## 2024-03-05 DIAGNOSIS — J22 Unspecified acute lower respiratory infection: Secondary | ICD-10-CM | POA: Diagnosis not present

## 2024-03-05 DIAGNOSIS — I350 Nonrheumatic aortic (valve) stenosis: Secondary | ICD-10-CM

## 2024-03-05 MED ORDER — AMOXICILLIN-POT CLAVULANATE 500-125 MG PO TABS: 1.0000 | ORAL_TABLET | Freq: Two times a day (BID) | ORAL | 0 refills | Status: AC

## 2024-03-05 NOTE — Patient Instructions (Addendum)
 Medication Instructions:  GET PLAIN MUCINEX (GUAIFENESIN)  START AUGMENTIN  500-125 MG TWICE A DAY  REACH OUT TO PRIMARY IF NO BETTER OR START FEELING WORSE    *If you need a refill on your cardiac medications before your next appointment, please call your pharmacy*  Lab Work: NONE  Testing/Procedures: NONE  Follow-Up: At [facility], you and your health needs are our priority.  As part of our continuing mission to provide you with exceptional heart care, our providers are all part of one team.  This team includes your primary Cardiologist (physician) and Advanced Practice Providers or APPs (Physician Assistants and Nurse Practitioners) who all work together to provide you with the care you need, when you need it.  Your next appointment:   4 month(s)  Provider:   Annabella Scarce, MD, Rosaline Bane, NP, or Reche Finder, NP   We recommend signing up for the patient portal called MyChart.  Sign up information is provided on this After Visit Summary.  MyChart is used to connect with patients for Virtual Visits (Telemedicine).  Patients are able to view lab/test results, encounter notes, upcoming appointments, etc.  Non-urgent messages can be sent to your provider as well.   To learn more about what you can do with MyChart, go to [email or web address].   Other Instructions MONITOR AND LOG YOUR BLOOD PRESSURE. BRING READINGS AND MACHINE TO FOLLOW UP

## 2024-03-05 NOTE — Progress Notes (Signed)
 Cardiology Office Note:  .   Date:  03/15/2024  ID:  Denise [name], DOB October 10, 1945, MRN [id number] PCP: Norleen Lynwood ORN, MD  [facility] Providers Cardiologist:  Annabella Scarce, MD    History of Present Illness: .    Denise [name] is a 78 y.o. female retired [employer] with severe aortic stenosis s/p 23mm Sapien 3 TAVR, patient prosthesis mismatch, mild carotid stenosis, diabetes, hyperlipidemia, hypertension, asthma, and breast cancer s/p lumpectomy and XRT here for follow up.  She was initially seen 07/2016 after her PCP noted a murmur. She was referred for an echo that showed LVEF 55-60% with grade 1 diastolic dysfunction at severe aortic stenosis.  The mean gradient was 48 mmHg.  She had a left heart catheterization 09/04/16 that revealed diffuse calcification and mild non-obstructive coronary artery disease.  She also had carotid Dopplers that revealed 1-39% bilateral ICA stenosis.   She underwent successful placement of a TAVR bioprothesis 09/24/16 with Dr. Wonda.  Her echo 09/25/16 revealed LVEF 60-65% and a well-functioning aortic valve prosthesis. There is trivial AR and mild MR.  However, she had a follow-up echo 10/17/16 that revealed a significant elevation of the aortic valve gradients (mean gradient 36 mmHg.  Given that the leaflets showed normal mobility, this was thought to be due to patient-prosthesis mismatch given that she has a small aortic root/annulus.  She had a cardiac CT that showed that the aortic valve was functioning well with no evidence of pannus or thrombus. Denise [name] had a repeat echo 08/2017 that showed the mean gradient across her TAVR was down to 27 mmHg. She also had carotid Dopplers that revealed 1-39% bilateral ICA stenosis.  Denise [name]'s BP was elevated and lisinopril  was increased to 40mg  daily.  Doxazosin  was added but discontinued due to nausea, vomiting, and dizziness.  Ms. Sahni's blood pressure was still elevated.  She wanted to work on  diet and exercise prior to changing her medications.  A plan was made to switch lisinopril  to vosartan if it remains elevated at follow-up.     On 01/12/21 she underwent decompressive laminectomy interbody fusion L1-L2. She saw her PCP 04/17/21 and her blood pressure was 158/80. However, she reported lower readings at home. Valsartan  was changed to 20mg  bid. She reported edema but her blood pressure was controlled. Amlodipine  was reduced, and valsartan  was increased. She was referred for a sleep study and was found to have sleep apnea. CPAP was recommended. She had carotid dopplers 09/2021 with stable mild stenosis bilaterally.   At her visit 01/2022 she reported fatigue which she attributed to working too much.  We discussed increasing exercise and increased valsartan .  She saw Reche Finder, NP 07/2022 with near syncope.  She was not orthostatic in the office.  Repeat echo 08/2022 revealed LVEF 53% with moderate LVH.  Mean aortic valve gradient was 17 mmHg.  08/2023 she had a fall with compression fractures.  She had to stop the PREP program after a fall.  At her visit 04/2023 blood pressures were uncontrolled.  Amlodipine  was increased to 10 mg.  This was reduced to 5 mg and spironolactone  was added.  At her visit 11/2023 BP was uncontrolled and she had LE edema.  Amlodipine  was reduced and valsartan  was increased.  Lasix  was switched to chlorthalidone . She was struggling with her CPAP.   Discussed the use of AI scribe software for clinical note transcription with the patient, who gave verbal consent to proceed.  History of Present Illness Denise [name] notes  that two weeks ago, she visited the ER with respiratory symptoms and was prescribed Decadon, which she did not take. All tests were negative. An x-ray was performed by her doctor, after which she was prescribed a Z-Pak and a five-day course of prednisone .  Despite treatment, she continues to experience a burning sensation in her chest and a  persistent cough, described as requiring multiple attempts to clear her throat. She reports post-nasal drip and significant nasal congestion. Albuterol  treatments, initially every two hours for six hours, then every four hours, and eventually every six hours, have helped open her airways and allowed her to expel mucus.  She feels generally unwell, with symptoms including weakness in her eyes and head, and a sensation of facial swelling. She has been using Symbicort  and a rescue inhaler, which provide temporary relief. She has not tried Mucinex yet. No fevers or chills since her ER visit, although she experienced body aches and was out of work for three to four days while on antibiotics.  Her blood pressure has been stable, though it appears high when checked at home. She has experienced tightness and numbness in her legs, which she attributes to her position when lying down. She reports facial swelling and tightness in her legs, but no significant swelling in her legs. She experiences numbness in her hands and feet.  Her creatinine level was 1.19, similar to previous levels. She is on chlorthalidone  and valsartan . She notes a decrease in urination compared to when she was on furosemide .  ROS:  As per HPI  Studies Reviewed: .       Echo 07/25/23: 1. Left ventricular ejection fraction, by estimation, is 70 to 75%. The  left ventricle has hyperdynamic function. The left ventricle has no  regional wall motion abnormalities. There is mild concentric left  ventricular hypertrophy. Left ventricular  diastolic parameters are consistent with Grade I diastolic dysfunction  (impaired relaxation). Elevated left atrial pressure.   2. Right ventricular systolic function is normal. The right ventricular  size is normal.   3. Left atrial size was mildly dilated.   4. The mitral valve is normal in structure. Trivial mitral valve  regurgitation. No evidence of mitral stenosis. Moderate mitral annular   calcification.   5. The aortic valve has been repaired/replaced. Aortic valve  regurgitation is trivial. No aortic stenosis is present. There is a 23 mm  Sapien prosthetic (TAVR) valve present in the aortic position. Procedure  Date: 09/24/16.   6. The inferior vena cava is normal in size with greater than 50%  respiratory variability, suggesting right atrial pressure of 3 mmHg.    Risk Assessment/Calculations:     Physical Exam:   VS:  BP 122/64 (BP Location: Left Arm, Patient Position: Sitting, Cuff Size: Large)   Pulse 86   Ht 5' 1 (1.549 m)   Wt 196 lb 9.6 oz (89.2 kg)   SpO2 95%   BMI 37.15 kg/m  , BMI Body mass index is 37.15 kg/m. GENERAL:  Well appearing HEENT: Pupils equal round and reactive, fundi not visualized, oral mucosa unremarkable NECK:  No jugular venous distention, waveform within normal limits, carotid upstroke brisk and symmetric, no bruits, no thyromegaly LUNGS:  Clear to auscultation bilaterally HEART:  RRR.  PMI not displaced or sustained,S1 and S2 within normal limits, no S3, no S4, no clicks, no rubs, no murmurs ABD:  Flat, positive bowel sounds normal in frequency in pitch, no bruits, no rebound, no guarding, no midline pulsatile mass, no hepatomegaly,  no splenomegaly EXT:  2 plus pulses throughout, no edema, no cyanosis no clubbing SKIN:  No rashes no nodules NEURO:  Cranial nerves II through XII grossly intact, motor grossly intact throughout PSYCH:  Cognitively intact, oriented to person place and time   ASSESSMENT AND PLAN: .    Assessment & Plan # Acute sinusitis Symptoms indicate sinus infection. Previous Z-Pak and prednisone  were ineffective. - Prescribed Augmentin  for 7 days, twice daily. - Recommended over-the-counter Mucinex.  # Chronic kidney disease Mildly abnormal kidney function with stable creatinine at 1.19. Current medications are kidney protective. - Ensure adequate fluid intake. - Continue chlorthalidone  and valsartan .  #  Primary hypertension Blood pressure improving with current regimen. Amlodipine  reduced due to lower extremity swelling. - Continue current antihypertensive regimen.  Amlodipine , chlorthalidone , spironolactone  and valsartan .  - Bring blood pressure log and machine to next appointment.  # s/p TAVR:  Valve stable on echo 06/2023.  # Hyperlipidemia:  Continue atorvastatin  and fish oil.   # Asthma Exacerbation with increased rescue inhaler use. Managed with Symbicort  and rescue inhaler. - Continue Symbicort  and rescue inhaler. - Use albuterol  treatments as needed.   Dispo: f/u 4 months  Signed, Annabella Scarce, MD

## 2024-03-15 ENCOUNTER — Encounter (HOSPITAL_BASED_OUTPATIENT_CLINIC_OR_DEPARTMENT_OTHER): Payer: Self-pay | Admitting: Cardiovascular Disease

## 2024-03-18 ENCOUNTER — Other Ambulatory Visit: Payer: Self-pay | Admitting: Internal Medicine

## 2024-03-19 ENCOUNTER — Other Ambulatory Visit: Payer: Self-pay

## 2024-03-30 ENCOUNTER — Encounter: Payer: Self-pay | Admitting: Internal Medicine

## 2024-03-30 ENCOUNTER — Ambulatory Visit: Admitting: Internal Medicine

## 2024-03-30 ENCOUNTER — Ambulatory Visit: Payer: Self-pay | Admitting: Internal Medicine

## 2024-03-30 VITALS — BP 130/80 | HR 76 | Temp 98.4°F | Ht 61.0 in | Wt 202.0 lb

## 2024-03-30 DIAGNOSIS — N1831 Chronic kidney disease, stage 3a: Secondary | ICD-10-CM | POA: Diagnosis not present

## 2024-03-30 DIAGNOSIS — I779 Disorder of arteries and arterioles, unspecified: Secondary | ICD-10-CM

## 2024-03-30 DIAGNOSIS — Z1231 Encounter for screening mammogram for malignant neoplasm of breast: Secondary | ICD-10-CM

## 2024-03-30 DIAGNOSIS — Z7984 Long term (current) use of oral hypoglycemic drugs: Secondary | ICD-10-CM | POA: Diagnosis not present

## 2024-03-30 DIAGNOSIS — E559 Vitamin D deficiency, unspecified: Secondary | ICD-10-CM | POA: Diagnosis not present

## 2024-03-30 DIAGNOSIS — I10 Essential (primary) hypertension: Secondary | ICD-10-CM | POA: Diagnosis not present

## 2024-03-30 DIAGNOSIS — K219 Gastro-esophageal reflux disease without esophagitis: Secondary | ICD-10-CM | POA: Diagnosis not present

## 2024-03-30 DIAGNOSIS — E1122 Type 2 diabetes mellitus with diabetic chronic kidney disease: Secondary | ICD-10-CM | POA: Diagnosis not present

## 2024-03-30 DIAGNOSIS — E7800 Pure hypercholesterolemia, unspecified: Secondary | ICD-10-CM | POA: Diagnosis not present

## 2024-03-30 DIAGNOSIS — E119 Type 2 diabetes mellitus without complications: Secondary | ICD-10-CM

## 2024-03-30 LAB — LIPID PANEL
Cholesterol: 182 mg/dL (ref 0–200)
HDL: 85.3 mg/dL (ref >39.00)
LDL Cholesterol: 87 mg/dL (ref 0–99)
NonHDL: 96.91
Total CHOL/HDL Ratio: 2
Triglycerides: 48 mg/dL (ref 0.0–149.0)
VLDL: 9.6 mg/dL (ref 0.0–40.0)

## 2024-03-30 LAB — CBC WITH DIFFERENTIAL/PLATELET
Basophils Absolute: 0.1 K/uL (ref 0.0–0.1)
Basophils Relative: 1 % (ref 0.0–3.0)
Eosinophils Absolute: 0.1 K/uL (ref 0.0–0.7)
Eosinophils Relative: 1.5 % (ref 0.0–5.0)
HCT: 32 % — ABNORMAL LOW (ref 36.0–46.0)
Hemoglobin: 10.8 g/dL — ABNORMAL LOW (ref 12.0–15.0)
Lymphocytes Relative: 28.2 % (ref 12.0–46.0)
Lymphs Abs: 2.6 K/uL (ref 0.7–4.0)
MCHC: 33.7 g/dL (ref 30.0–36.0)
MCV: 82 fl (ref 78.0–100.0)
Monocytes Absolute: 0.6 K/uL (ref 0.1–1.0)
Monocytes Relative: 6.6 % (ref 3.0–12.0)
Neutro Abs: 5.7 K/uL (ref 1.4–7.7)
Neutrophils Relative %: 62.7 % (ref 43.0–77.0)
Platelets: 277 K/uL (ref 150.0–400.0)
RBC: 3.9 Mil/uL (ref 3.87–5.11)
RDW: 16.3 % — ABNORMAL HIGH (ref 11.5–15.5)
WBC: 9.1 K/uL (ref 4.0–10.5)

## 2024-03-30 LAB — TSH: TSH: 1.36 u[IU]/mL (ref 0.35–5.50)

## 2024-03-30 LAB — BASIC METABOLIC PANEL WITH GFR
BUN: 24 mg/dL — ABNORMAL HIGH (ref 6–23)
CO2: 31 meq/L (ref 19–32)
Calcium: 10 mg/dL (ref 8.4–10.5)
Chloride: 106 meq/L (ref 96–112)
Creatinine, Ser: 1.07 mg/dL (ref 0.40–1.20)
GFR: 49.68 mL/min — ABNORMAL LOW (ref >60.00)
Glucose, Bld: 110 mg/dL — ABNORMAL HIGH (ref 70–99)
Potassium: 4 meq/L (ref 3.5–5.1)
Sodium: 143 meq/L (ref 135–145)

## 2024-03-30 LAB — HEPATIC FUNCTION PANEL
ALT: 20 U/L (ref 0–35)
AST: 25 U/L (ref 0–37)
Albumin: 4.3 g/dL (ref 3.5–5.2)
Alkaline Phosphatase: 46 U/L (ref 39–117)
Bilirubin, Direct: 0.2 mg/dL (ref 0.0–0.3)
Total Bilirubin: 0.8 mg/dL (ref 0.2–1.2)
Total Protein: 7.5 g/dL (ref 6.0–8.3)

## 2024-03-30 LAB — VITAMIN D 25 HYDROXY (VIT D DEFICIENCY, FRACTURES): VITD: 48.71 ng/mL (ref 30.00–100.00)

## 2024-03-30 LAB — HEMOGLOBIN A1C: Hgb A1c MFr Bld: 5.7 % (ref 4.6–6.5)

## 2024-03-30 NOTE — Assessment & Plan Note (Addendum)
 Mild to mod, for GI referral to f/u per pt reqeust , cont protonix  40 bid, to f/u any worsening symptoms or concerns

## 2024-03-30 NOTE — Assessment & Plan Note (Signed)
 Lab Results  Component Value Date   LDLCALC 87 03/30/2024   uncontrolled, pt to continue current statin lipitor 80 mg every day and lower chol diet, declines add zetia for now
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
## 2024-03-30 NOTE — Assessment & Plan Note (Signed)
 BP Readings from Last 3 Encounters:  03/30/24 130/80  03/05/24 122/64  02/23/24 (!) 150/76   Stable, pt to continue medical treatment chlorthalidone  25 every day, diovan  80 bid, norvasc  5 qd

## 2024-03-30 NOTE — Progress Notes (Signed)
 The test results show that your current treatment is OK, as the tests are stable.  Please continue the same plan.  There is no other need for change of treatment or further evaluation based on these results, at this time.  thanks

## 2024-03-30 NOTE — Assessment & Plan Note (Addendum)
 With ckd3a Lab Results  Component Value Date   HGBA1C 5.7 03/30/2024   Stable, pt to continue current medical treatment metformin  1000 bid,  Lab Results  Component Value Date   CREATININE 1.07 03/30/2024   Stable overall, cont to avoid nephrotoxins

## 2024-03-30 NOTE — Assessment & Plan Note (Signed)
 Non obstructive, cont low chol diet, lipitor 80 mg every day, f/u repeat study oct 2026

## 2024-03-30 NOTE — Patient Instructions (Addendum)
 Please have your Shingrix (shingles) shots done at your local pharmacy.  Please continue all other medications as before, and refills have been done if requested.  Please have the pharmacy call with any other refills you may need.  Please continue your efforts at being more active, low cholesterol diet, and weight control.  You are otherwise up to date with prevention measures today.  Please keep your appointments with your specialists as you may have planned - orthopedic  You will be contacted regarding the referral for: mammogram 3D, and Gastroenterology  Please go to the LAB at the blood drawing area for the tests to be done  You will be contacted by phone if any changes need to be made immediately.  Otherwise, you will receive a letter about your results with an explanation, but please check with MyChart first.  Please make an Appointment to return in 6 months, or sooner if needed

## 2024-03-31 ENCOUNTER — Emergency Department (HOSPITAL_COMMUNITY)

## 2024-03-31 ENCOUNTER — Emergency Department (HOSPITAL_COMMUNITY): Admission: EM | Admit: 2024-03-31 | Discharge: 2024-03-31 | Disposition: A | Discharge status: Home / Self Care

## 2024-03-31 DIAGNOSIS — Z981 Arthrodesis status: Secondary | ICD-10-CM | POA: Diagnosis not present

## 2024-03-31 DIAGNOSIS — M47816 Spondylosis without myelopathy or radiculopathy, lumbar region: Secondary | ICD-10-CM | POA: Diagnosis not present

## 2024-03-31 DIAGNOSIS — M4854XA Collapsed vertebra, not elsewhere classified, thoracic region, initial encounter for fracture: Secondary | ICD-10-CM | POA: Diagnosis not present

## 2024-03-31 DIAGNOSIS — S8012XA Contusion of left lower leg, initial encounter: Secondary | ICD-10-CM | POA: Insufficient documentation

## 2024-03-31 DIAGNOSIS — M549 Dorsalgia, unspecified: Secondary | ICD-10-CM | POA: Insufficient documentation

## 2024-03-31 DIAGNOSIS — Z041 Encounter for examination and observation following transport accident: Secondary | ICD-10-CM | POA: Diagnosis not present

## 2024-03-31 DIAGNOSIS — M542 Cervicalgia: Secondary | ICD-10-CM | POA: Diagnosis not present

## 2024-03-31 DIAGNOSIS — R079 Chest pain, unspecified: Secondary | ICD-10-CM

## 2024-03-31 DIAGNOSIS — M47814 Spondylosis without myelopathy or radiculopathy, thoracic region: Secondary | ICD-10-CM | POA: Diagnosis not present

## 2024-03-31 DIAGNOSIS — S20212A Contusion of left front wall of thorax, initial encounter: Secondary | ICD-10-CM | POA: Diagnosis not present

## 2024-03-31 DIAGNOSIS — I10 Essential (primary) hypertension: Secondary | ICD-10-CM | POA: Diagnosis not present

## 2024-03-31 DIAGNOSIS — I700 Atherosclerosis of aorta: Secondary | ICD-10-CM | POA: Diagnosis not present

## 2024-03-31 DIAGNOSIS — I517 Cardiomegaly: Secondary | ICD-10-CM | POA: Diagnosis not present

## 2024-03-31 DIAGNOSIS — R1084 Generalized abdominal pain: Secondary | ICD-10-CM | POA: Diagnosis not present

## 2024-03-31 DIAGNOSIS — M47812 Spondylosis without myelopathy or radiculopathy, cervical region: Secondary | ICD-10-CM | POA: Diagnosis not present

## 2024-03-31 DIAGNOSIS — Y92410 Unspecified street and highway as the place of occurrence of the external cause: Secondary | ICD-10-CM | POA: Insufficient documentation

## 2024-03-31 DIAGNOSIS — S8011XA Contusion of right lower leg, initial encounter: Secondary | ICD-10-CM | POA: Insufficient documentation

## 2024-03-31 DIAGNOSIS — Z96651 Presence of right artificial knee joint: Secondary | ICD-10-CM | POA: Diagnosis not present

## 2024-03-31 DIAGNOSIS — M545 Low back pain, unspecified: Secondary | ICD-10-CM | POA: Diagnosis not present

## 2024-03-31 DIAGNOSIS — I77819 Aortic ectasia, unspecified site: Secondary | ICD-10-CM | POA: Diagnosis not present

## 2024-03-31 DIAGNOSIS — K5730 Diverticulosis of large intestine without perforation or abscess without bleeding: Secondary | ICD-10-CM | POA: Diagnosis not present

## 2024-03-31 DIAGNOSIS — R0789 Other chest pain: Secondary | ICD-10-CM | POA: Insufficient documentation

## 2024-03-31 DIAGNOSIS — R109 Unspecified abdominal pain: Secondary | ICD-10-CM | POA: Insufficient documentation

## 2024-03-31 LAB — CBC WITH DIFFERENTIAL/PLATELET
Abs Immature Granulocytes: 0.05 K/uL (ref 0.00–0.07)
Basophils Absolute: 0.1 K/uL (ref 0.0–0.1)
Basophils Relative: 1 %
Eosinophils Absolute: 0.1 K/uL (ref 0.0–0.5)
Eosinophils Relative: 1 %
HCT: 31.5 % — ABNORMAL LOW (ref 36.0–46.0)
Hemoglobin: 10.2 g/dL — ABNORMAL LOW (ref 12.0–15.0)
Immature Granulocytes: 0 %
Lymphocytes Relative: 24 %
Lymphs Abs: 3 K/uL (ref 0.7–4.0)
MCH: 27.2 pg (ref 26.0–34.0)
MCHC: 32.4 g/dL (ref 30.0–36.0)
MCV: 84 fL (ref 80.0–100.0)
Monocytes Absolute: 1 K/uL (ref 0.1–1.0)
Monocytes Relative: 8 %
Neutro Abs: 8.3 K/uL — ABNORMAL HIGH (ref 1.7–7.7)
Neutrophils Relative %: 66 %
Platelets: 282 K/uL (ref 150–400)
RBC: 3.75 MIL/uL — ABNORMAL LOW (ref 3.87–5.11)
RDW: 15.6 % — ABNORMAL HIGH (ref 11.5–15.5)
WBC: 12.5 K/uL — ABNORMAL HIGH (ref 4.0–10.5)
nRBC: 0 % (ref 0.0–0.2)

## 2024-03-31 LAB — BASIC METABOLIC PANEL WITH GFR
Anion gap: 11 (ref 5–15)
BUN: 18 mg/dL (ref 8–23)
CO2: 26 mmol/L (ref 22–32)
Calcium: 9.4 mg/dL (ref 8.9–10.3)
Chloride: 102 mmol/L (ref 98–111)
Creatinine, Ser: 0.99 mg/dL (ref 0.44–1.00)
GFR, Estimated: 58 mL/min — ABNORMAL LOW (ref >60)
Glucose, Bld: 86 mg/dL (ref 70–99)
Potassium: 3.9 mmol/L (ref 3.5–5.1)
Sodium: 139 mmol/L (ref 135–145)

## 2024-03-31 LAB — TROPONIN I (HIGH SENSITIVITY): Troponin I (High Sensitivity): 17 ng/L (ref <18)

## 2024-03-31 MED ORDER — IOHEXOL 350 MG/ML SOLN
75.0000 mL | Freq: Once | INTRAVENOUS | Status: AC | PRN
  Administered 2024-03-31: 75 mL via INTRAVENOUS

## 2024-03-31 MED ORDER — DICLOFENAC SODIUM 1 % EX GEL: 4.0000 g | Freq: Four times a day (QID) | CUTANEOUS | 0 refills | Status: AC

## 2024-03-31 NOTE — ED Notes (Signed)
 Phlebotomy attempted to obtain labs but was unsuccessful

## 2024-03-31 NOTE — ED Notes (Signed)
 Unsucessful IV start attempt. IV team consulted.

## 2024-03-31 NOTE — Discharge Instructions (Addendum)
 Follow up with your doctor in the office.   Use the gel as prescribed Also take tylenol 1000mg (2 extra strength) four times a day.

## 2024-03-31 NOTE — ED Notes (Signed)
 Patient given discharge and medication instructions. Patient verbalized understanding. Patient helped to private vehicle with wheelchair.

## 2024-03-31 NOTE — ED Notes (Signed)
 Patient ambulated from hallway bed 12 to bathroom and back; approximately 75 ft. Patient stated her right knee did hurt with ambulation and she knows it'll be sore tomorrow.

## 2024-03-31 NOTE — ED Notes (Signed)
 Pt in ems triage 14, family member asked if I could help put on a bed pan because she needed to void, helped pt on a bed pan, changed bedding, pt states that she is waiting for iv therapy for an iv, iv placed in L ac, blood drawn and sent, pt has no other requests at this time.
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
## 2024-04-01 ENCOUNTER — Encounter: Payer: Self-pay | Admitting: Internal Medicine

## 2024-04-04 ENCOUNTER — Encounter (HOSPITAL_BASED_OUTPATIENT_CLINIC_OR_DEPARTMENT_OTHER): Payer: Self-pay | Admitting: Cardiovascular Disease

## 2024-04-05 ENCOUNTER — Encounter: Payer: Self-pay | Admitting: Internal Medicine

## 2024-04-05 ENCOUNTER — Ambulatory Visit: Admitting: Internal Medicine

## 2024-04-05 VITALS — BP 126/82 | HR 80 | Temp 98.2°F | Ht 61.0 in | Wt 198.0 lb

## 2024-04-05 DIAGNOSIS — N1831 Chronic kidney disease, stage 3a: Secondary | ICD-10-CM

## 2024-04-05 DIAGNOSIS — S8010XD Contusion of unspecified lower leg, subsequent encounter: Secondary | ICD-10-CM

## 2024-04-05 DIAGNOSIS — S8010XA Contusion of unspecified lower leg, initial encounter: Secondary | ICD-10-CM | POA: Insufficient documentation

## 2024-04-05 DIAGNOSIS — I10 Essential (primary) hypertension: Secondary | ICD-10-CM | POA: Diagnosis not present

## 2024-04-05 DIAGNOSIS — E1122 Type 2 diabetes mellitus with diabetic chronic kidney disease: Secondary | ICD-10-CM | POA: Diagnosis not present

## 2024-04-05 NOTE — Assessment & Plan Note (Signed)
 Lab Results  Component Value Date   HGBA1C 5.7 03/30/2024  With ckd3a Stable, pt to continue current medical treatment  - diet, wt control

## 2024-04-05 NOTE — Assessment & Plan Note (Signed)
 BP Readings from Last 3 Encounters:  04/05/24 126/82  03/31/24 129/88  03/30/24 130/80   Stable, pt to continue medical treatment norvasc  5 every day, chlorthalidone  25 every day, diovan  80 mg qd
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
## 2024-04-05 NOTE — Patient Instructions (Addendum)
 Please continue all other medications as before, and refills have been done if requested.  Please have the pharmacy call with any other refills you may need.  Please keep your appointments with your specialists as you may have planned  You are given the work note  We can hold on further lab testing today  Good Luck with the new car!

## 2024-04-07 ENCOUNTER — Encounter: Payer: Self-pay | Admitting: Internal Medicine

## 2024-04-07 ENCOUNTER — Encounter (HOSPITAL_BASED_OUTPATIENT_CLINIC_OR_DEPARTMENT_OTHER): Payer: Self-pay | Admitting: Cardiovascular Disease

## 2024-04-07 NOTE — Telephone Encounter (Signed)
 Ok to let pt know, this was almost inevitable, and the only thing needed for now is to watch for any signs of infection such as pain, redness, swelling or pus or fever; ok to cover with gauze from the pharmacy to catch drainage and protect the leaking areas to help with being able to heal..   Thanks

## 2024-04-28 ENCOUNTER — Ambulatory Visit
Admission: RE | Admit: 2024-04-28 | Discharge: 2024-04-28 | Disposition: A | Discharge status: Home / Self Care | Source: Ambulatory Visit | Attending: Internal Medicine | Admitting: Internal Medicine

## 2024-04-28 ENCOUNTER — Other Ambulatory Visit: Payer: Self-pay | Admitting: Physician Assistant

## 2024-04-28 DIAGNOSIS — Z952 Presence of prosthetic heart valve: Secondary | ICD-10-CM

## 2024-04-28 DIAGNOSIS — I25118 Atherosclerotic heart disease of native coronary artery with other forms of angina pectoris: Secondary | ICD-10-CM

## 2024-04-28 DIAGNOSIS — Z79899 Other long term (current) drug therapy: Secondary | ICD-10-CM

## 2024-04-28 DIAGNOSIS — Z1231 Encounter for screening mammogram for malignant neoplasm of breast: Secondary | ICD-10-CM

## 2024-04-28 DIAGNOSIS — E7800 Pure hypercholesterolemia, unspecified: Secondary | ICD-10-CM

## 2024-04-28 DIAGNOSIS — I2510 Atherosclerotic heart disease of native coronary artery without angina pectoris: Secondary | ICD-10-CM

## 2024-04-28 DIAGNOSIS — E785 Hyperlipidemia, unspecified: Secondary | ICD-10-CM

## 2024-04-28 DIAGNOSIS — I350 Nonrheumatic aortic (valve) stenosis: Secondary | ICD-10-CM

## 2024-05-04 ENCOUNTER — Other Ambulatory Visit: Payer: Self-pay

## 2024-05-04 MED ORDER — PANTOPRAZOLE SODIUM 40 MG PO TBEC: 40.0000 mg | DELAYED_RELEASE_TABLET | Freq: Two times a day (BID) | ORAL | 0 refills | Status: DC

## 2024-05-04 NOTE — Progress Notes (Signed)
 Refill for pantoprazole  40 mg received by fax.  1 refill sent only because patient has not been seen since 2023.

## 2024-05-07 ENCOUNTER — Ambulatory Visit (INDEPENDENT_AMBULATORY_CARE_PROVIDER_SITE_OTHER)
Admission: RE | Admit: 2024-05-07 | Discharge: 2024-05-07 | Disposition: A | Discharge status: Home / Self Care | Source: Ambulatory Visit | Attending: Physician Assistant | Admitting: Physician Assistant

## 2024-05-07 ENCOUNTER — Encounter: Payer: Self-pay | Admitting: Physician Assistant

## 2024-05-07 ENCOUNTER — Ambulatory Visit: Admitting: Physician Assistant

## 2024-05-07 VITALS — BP 128/80 | HR 76 | Ht 61.0 in | Wt 198.2 lb

## 2024-05-07 DIAGNOSIS — K219 Gastro-esophageal reflux disease without esophagitis: Secondary | ICD-10-CM | POA: Diagnosis not present

## 2024-05-07 DIAGNOSIS — R1013 Epigastric pain: Secondary | ICD-10-CM

## 2024-05-07 DIAGNOSIS — Z860100 Personal history of colon polyps, unspecified: Secondary | ICD-10-CM

## 2024-05-07 DIAGNOSIS — Z952 Presence of prosthetic heart valve: Secondary | ICD-10-CM

## 2024-05-07 MED ORDER — DEXLANSOPRAZOLE 60 MG PO CPDR: 60.0000 mg | DELAYED_RELEASE_CAPSULE | Freq: Every day | ORAL | 3 refills | Status: AC

## 2024-05-07 NOTE — Patient Instructions (Addendum)
 Your provider has requested that you have an abdominal x ray before leaving today. Please go to the basement floor to our Radiology department for the test.  We have sent the following medications to your pharmacy for you to pick up at your convenience: dexlansoprazole  (DEXILANT ) 60 MG capsule.  You have been scheduled for an endoscopy. Please follow written instructions given to you at your visit today.  If you use inhalers (even only as needed), please bring them with you on the day of your procedure.  If you take any of the following medications, they will need to be adjusted prior to your procedure:   DO NOT TAKE 7 DAYS PRIOR TO TEST- Trulicity (dulaglutide) Ozempic, Wegovy (semaglutide) Mounjaro, Zepbound (tirzepatide) Bydureon Bcise (exanatide extended release)  DO NOT TAKE 1 DAY PRIOR TO YOUR TEST Rybelsus (semaglutide) Adlyxin (lixisenatide) Victoza (liraglutide) Byetta (exanatide) ___________________________________________________________________________  VISIT SUMMARY:  During your visit, we discussed your persistent upper gastrointestinal symptoms, including early satiety, bloating, and belching. We reviewed your history and previous treatments, and we have made some adjustments to your medication regimen and ordered further tests to better understand your condition.  YOUR PLAN:  GASTROESOPHAGEAL REFLUX DISEASE WITH DYSPEPSIA AND EARLY SATIETY: You have ongoing symptoms of early satiety, bloating, and belching despite previous treatments. There is no current difficulty swallowing or food impaction. -Restart proton pump inhibitor therapy. If insurance permits, start Dexilant  in the morning; otherwise, restart Nexium twice daily. -Restart famotidine  at night. -A gastric emptying study has been ordered to check for gastroparesis. -Avoid NSAIDs, alcohol, and tobacco. -We discussed the possibility of repeating an esophagogastroduodenoscopy since it has been nearly three  years since your last procedure. 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  Gastroparesis Gastroparesis is a condition in which food takes longer than normal to empty from the stomach.  This condition is also known as delayed gastric emptying. It is usually a long-term (chronic) condition.  What are the signs or symptoms? Symptoms of this condition include: Feeling full after eating very little or a loss of appetite. Nausea, vomiting, or heartburn. Bloating of your abdomen. Inconsistent blood sugar (glucose) levels on blood tests. Unexplained weight loss. Acid from the stomach coming up into the esophagus (gastroesophageal reflux). Sudden tightening (spasm) of the stomach, which can be painful. Symptoms may come and go. Some people may not notice any symptoms.  What increases the risk? You are more likely to develop this condition if: You have certain disorders or diseases. These may include: An endocrine disorder. An eating disorder. Amyloidosis. Scleroderma. Parkinson's disease. Multiple sclerosis. Cancer or infection of the stomach or the vagus nerve. You have had surgery on your stomach or vagus nerve. You take certain medicines. You are female.  Things you can do: Please do small frequent meals like 4-6 meals a day.  Eat and drink liquids at separate times.  Avoid high fiber foods, cook your vegetables, avoid high fat food.  Suggest spreading protein throughout the day (greek yogurt, glucerna, soft meat, milk, eggs) Choose soft foods that you can mash with a fork When you are more symptomatic, change to pureed foods foods and liquids.  Consider reading Living well with Gastroparesis by [facility] Check out this link to a diet online https://my.groupjournal.fr   Thank you for trusting me with your  gastrointestinal care!   Alan Coombs, PA-C  _______________________________________________________  If your blood pressure at your visit was 140/90 or greater, please  contact your primary care physician to follow up on this.  _______________________________________________________  If you are age 25 or older, your body mass index should be between 23-30. Your Body mass index is 37.46 kg/m. If this is out of the aforementioned range listed, please consider follow up with your Primary Care Provider.  If you are age 72 or younger, your body mass index should be between 19-25. Your Body mass index is 37.46 kg/m. If this is out of the aformentioned range listed, please consider follow up with your Primary Care Provider.   ________________________________________________________  The  GI providers would like to encourage you to use MYCHART to communicate with providers for non-urgent requests or questions.  Due to long hold times on the telephone, sending your provider a message by [facility] may be a faster and more efficient way to get a response.  Please allow 48 business hours for a response.  Please remember that this is for non-urgent requests.  _______________________________________________________  [facility] is using a team-based approach to care.  Your team is made up of your doctor and two to three APPS. Our APPS (Nurse Practitioners and Physician Assistants) work with your physician to ensure care continuity for you. They are fully qualified to address your health concerns and develop a treatment plan. They communicate directly with your gastroenterologist to care for you. Seeing the Advanced Practice Practitioners on your physician's team can help you by facilitating care more promptly, often allowing for earlier appointments, access to diagnostic testing, procedures, and other specialty referrals.

## 2024-05-07 NOTE — Addendum Note (Signed)
 Addended by: WILLIEMAE JOLA PARAS on: 05/07/2024 04:08 PM   Modules accepted: Orders
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
## 2024-05-10 ENCOUNTER — Encounter: Payer: Self-pay | Admitting: Pediatrics

## 2024-05-10 ENCOUNTER — Ambulatory Visit: Admitting: Pediatrics

## 2024-05-10 VITALS — BP 174/85 | HR 72 | Temp 97.3°F | Resp 22 | Ht 61.0 in | Wt 198.0 lb

## 2024-05-10 DIAGNOSIS — R6881 Early satiety: Secondary | ICD-10-CM | POA: Diagnosis not present

## 2024-05-10 DIAGNOSIS — R142 Eructation: Secondary | ICD-10-CM

## 2024-05-10 DIAGNOSIS — K219 Gastro-esophageal reflux disease without esophagitis: Secondary | ICD-10-CM

## 2024-05-10 DIAGNOSIS — R1013 Epigastric pain: Secondary | ICD-10-CM | POA: Diagnosis not present

## 2024-05-10 DIAGNOSIS — R1310 Dysphagia, unspecified: Secondary | ICD-10-CM

## 2024-05-10 MED ORDER — PANTOPRAZOLE SODIUM 40 MG PO TBEC: 40.0000 mg | DELAYED_RELEASE_TABLET | Freq: Two times a day (BID) | ORAL | 3 refills | Status: DC

## 2024-05-10 MED ORDER — SODIUM CHLORIDE 0.9 % IV SOLN: 500.0000 mL | Freq: Once | INTRAVENOUS | Status: DC

## 2024-05-10 MED ORDER — FAMOTIDINE 40 MG PO TABS: 40.0000 mg | ORAL_TABLET | Freq: Every day | ORAL | 3 refills | Status: AC

## 2024-05-10 NOTE — Op Note (Addendum)
  [facility] Patient Name: Denise [name] Procedure Date: 05/10/2024 3:06 PM MRN: [id number] Endoscopist: Inocente Hausen , MD, [id number] Age: 79 Referring MD:  Date of Birth: [birth year] Gender: Female Account #: [id number] Procedure:                Upper GI endoscopy Indications:              Follow-up of gastro-esophageal reflux disease,                            Dyspepsia, Early satiety, Eructation Medicines:                Monitored Anesthesia Care Procedure:                Pre-Anesthesia Assessment:                           - Prior to the procedure, a History and Physical                            was performed, and patient medications and                            allergies were reviewed. The patient's tolerance of                            previous anesthesia was also reviewed. The risks                            and benefits of the procedure and the sedation                            options and risks were discussed with the patient.                            All questions were answered, and informed consent                            was obtained. Prior Anticoagulants: The patient has                            taken no anticoagulant or antiplatelet agents. ASA                            Grade Assessment: III - A patient with severe                            systemic disease. After reviewing the risks and                            benefits, the patient was deemed in satisfactory                            condition to undergo the procedure.  After obtaining informed consent, the endoscope was                            passed under direct vision. Throughout the                            procedure, the patient's blood pressure, pulse, and                            oxygen saturations were monitored continuously. The                            Olympus Scope [id number] was introduced through the                            mouth,  and advanced to the second part of duodenum.                            The upper GI endoscopy was accomplished without                            difficulty. The patient tolerated the procedure                            well. Scope In: Scope Out: Findings:                 No endoscopic abnormality was evident in the                            esophagus to explain the patient's complaint of                            dysphagia. It was decided, however, to proceed with                            dilation of the entire esophagus. A guidewire was                            placed and the scope was withdrawn. Dilation was                            performed with a Savary dilator with no resistance                            at 17 mm. Biopsies were obtained from the proximal                            and distal esophagus with cold forceps for                            histology for evaluation of eosinophilic  esophagitis.                           The gastric body, gastric antrum, cardia (on                            retroflexion) and gastric fundus (on retroflexion)                            were normal. Biopsies were taken with a cold                            forceps for Helicobacter pylori testing.                           The duodenal bulb and second portion of the                            duodenum were normal. Complications:            No immediate complications. Estimated blood loss:                            Minimal. Estimated Blood Loss:     Estimated blood loss was minimal. Impression:               - No endoscopic esophageal abnormality to explain                            patient's dysphagia. Esophagus dilated. Dilated to                            17 mm.                           - Normal gastric body, antrum, cardia and gastric                            fundus. Biopsied.                           - Normal duodenal bulb and second portion  of the                            duodenum.                           - Biopsies were taken with a cold forceps for                            evaluation of eosinophilic esophagitis. Recommendation:           - Discharge patient to home (ambulatory).                           - Await pathology results.                           -  Continue Protonix  40 mg p.o. twice daily 20 to 30                            minutes before meals                           - Continue Pepcid  40 mg once daily at the end of                            the day/bedtime                           - If biopsies are unrevealing and patient does not                            note a response to dilation consider updated barium                            esophagram +/- esophageal manometry                           - Return to clinic in 4 to 8 weeks with Dr. Suzann                            or APP Oscar Coombs or [facility] May).                           - The findings and recommendations were discussed                            with the patient's family.                           - Patient has a contact number available for                            emergencies. The signs and symptoms of potential                            delayed complications were discussed with the                            patient. Return to normal activities tomorrow.                            Written discharge instructions were provided to the                            patient. Inocente Suzann, MD 05/10/2024 3:47:57 PM This report has been signed electronically. Addendum Number: 1   Addendum Date: 05/10/2024 3:55:05 PM      At the time of recent clinic visit, patient's pantoprazole  was       discontinued and she was started on Dexilant  60 mg orally daily in  conjunction with Pepcid . She has only been on Dexilant  for few days.       Discussed that a 4-week trial is necessary to determine if she will have       benefit. Inocente Hausen,  MD 05/10/2024 3:55:37 PM This report has been signed electronically.

## 2024-05-10 NOTE — Progress Notes (Signed)
 Called to room to assist during endoscopic procedure.  Patient ID and intended procedure confirmed with present staff. Received instructions for my participation in the procedure from the performing physician.

## 2024-05-10 NOTE — Progress Notes (Signed)
 Pt's states no medical or surgical changes since previsit or office visit.

## 2024-05-10 NOTE — Progress Notes (Signed)
 Vss nad trans to pacu

## 2024-05-10 NOTE — Patient Instructions (Addendum)
 Await pathology results.                           - Continue Protonix  40 mg p.o. twice daily 20 to 30                            minutes before meals                           - Continue Pepcid  40 mg once daily at the end of                            the day/bedtime                           - If biopsies are unrevealing and patient does not                            note a response to dilation consider updated barium                            esophagram +/- esophageal manometry                           - Return to clinic in 4 to 8 weeks with Dr. Suzann                            or APP Oscar Coombs or [facility]).                           - The findings and recommendations were discussed                            with the patient's family.                           - Patient has a contact number available for                            emergencies. The signs and symptoms of potential                            delayed complications were discussed with the                            patient. Return to normal activities tomorrow.                            Written discharge instructions were provided to the                            patient.    YOU HAD AN ENDOSCOPIC PROCEDURE TODAY AT THE [facility]:   Refer to the procedure report that was given to you for any specific questions about  what was found during the examination.  If the procedure report does not answer your questions, please call your gastroenterologist to clarify.  If you requested that your care partner not be given the details of your procedure findings, then the procedure report has been included in a sealed envelope for you to review at your convenience later.  YOU SHOULD EXPECT: Some feelings of bloating in the abdomen. Passage of more gas than usual.  Walking can help get rid of the air that was put into your GI tract during the procedure and reduce the bloating. If you had a lower endoscopy (such as  a colonoscopy or flexible sigmoidoscopy) you may notice spotting of blood in your stool or on the toilet paper. If you underwent a bowel prep for your procedure, you may not have a normal bowel movement for a few days.  Please Note:  You might notice some irritation and congestion in your nose or some drainage.  This is from the oxygen used during your procedure.  There is no need for concern and it should clear up in a day or so.  SYMPTOMS TO REPORT IMMEDIATELY:   Following upper endoscopy (EGD)  Vomiting of blood or coffee ground material  New chest pain or pain under the shoulder blades  Painful or persistently difficult swallowing  New shortness of breath  Fever of 100F or higher  Black, tarry-looking stools  For urgent or emergent issues, a gastroenterologist can be reached at any hour by calling [phone]. Do not use MyChart messaging for urgent concerns.    DIET:  We do recommend a small meal at first, but then you may proceed to your regular diet.  Drink plenty of fluids but you should avoid alcoholic beverages for 24 hours.  ACTIVITY:  You should plan to take it easy for the rest of today and you should NOT DRIVE or use heavy machinery until tomorrow (because of the sedation medicines used during the test).    FOLLOW UP: Our staff will call the number listed on your records the next business day following your procedure.  We will call around 7:15- 8:00 am to check on you and address any questions or concerns that you may have regarding the information given to you following your procedure. If we do not reach you, we will leave a message.     If any biopsies were taken you will be contacted by phone or by letter within the next 1-3 weeks.  Please call us  at [phone] if you have not heard about the biopsies in 3 weeks.    SIGNATURES/CONFIDENTIALITY: You and/or your care partner have signed paperwork which will be entered into your electronic medical record.  These  signatures attest to the fact that that the information above on your After Visit Summary has been reviewed and is understood.  Full responsibility of the confidentiality of this discharge information lies with you and/or your care-partner.

## 2024-05-11 ENCOUNTER — Telehealth: Payer: Self-pay

## 2024-05-11 NOTE — Telephone Encounter (Signed)
 Left message on answering machine.

## 2024-05-13 ENCOUNTER — Ambulatory Visit: Payer: Self-pay | Admitting: Pediatrics

## 2024-05-13 LAB — SURGICAL PATHOLOGY

## 2024-05-17 ENCOUNTER — Other Ambulatory Visit (HOSPITAL_BASED_OUTPATIENT_CLINIC_OR_DEPARTMENT_OTHER): Payer: Self-pay | Admitting: Cardiovascular Disease

## 2024-05-17 ENCOUNTER — Ambulatory Visit: Payer: Self-pay | Admitting: Physician Assistant

## 2024-05-19 ENCOUNTER — Encounter: Payer: Self-pay | Admitting: Podiatry

## 2024-05-19 ENCOUNTER — Ambulatory Visit: Admitting: Podiatry

## 2024-05-19 DIAGNOSIS — L84 Corns and callosities: Secondary | ICD-10-CM

## 2024-05-19 DIAGNOSIS — B351 Tinea unguium: Secondary | ICD-10-CM

## 2024-05-19 DIAGNOSIS — E1142 Type 2 diabetes mellitus with diabetic polyneuropathy: Secondary | ICD-10-CM

## 2024-05-19 NOTE — Progress Notes (Signed)
 "  Subjective:  Patient ID: Denise [name], female    DOB: 06/15/1945,  MRN: [id number]  Denise [name] presents to clinic today for at risk foot care with history of diabetic neuropathy  Chief Complaint  Patient presents with   [facility]    NIDDM Patient with an A1c of 5.7 [resents today for [facility] and nail trim patient reports she is experiencing numbness and tingling.   New problem(s): None. preventative diabetic foot care, preventative diabetic foot care for painful, discolored, thick toenails which interfere with daily activities, and corn(s) b/l feet and painful mycotic toenails that are difficult to trim. Painful toenails interfere with ambulation. Aggravating factors include wearing enclosed shoe gear. Pain is relieved with periodic professional debridement. Painful corns are aggravated when weightbearing when wearing enclosed shoe gear. Pain is relieved with periodic professional debridement.  PCP is Norleen Lynwood ORN, MD.  Allergies[1]  Review of Systems: Negative except as noted in the HPI.  Objective: No changes noted in today's physical examination. There were no vitals filed for this visit. Denise [name] is a pleasant 79 y.o. female obese in NAD. AAO x 3.  Vascular Examination: Vascular status intact b/l with palpable pedal pulses. CFT immediate b/l. Pedal hair present. No edema. No pain with calf compression b/l. Skin temperature gradient WNL b/l. No varicosities noted. No cyanosis or clubbing noted.  Neurological Examination: Pt has subjective symptoms of neuropathy.Sensation grossly intact b/l with 10 gram monofilament. Vibratory sensation intact b/l.  Dermatological Examination: Pedal skin with normal turgor, texture and tone b/l. No open wounds nor interdigital macerations noted. Toenails 1-5 b/l thick, discolored, elongated with subungual debris and pain on dorsal palpation. Hyperkeratotic lesion(s) dorsal PIPJ of bilateral 5th toes.  No erythema, no edema, no  drainage, no fluctuance.  Musculoskeletal Examination: Muscle strength 5/5 to b/l LE.Muscle strength 5/5 to all lower extremity muscle groups bilaterally. Hammertoe(s) b/l 5th toes. Pes planus deformity noted bilateral LE.  Radiographs: None  Assessment/Plan: 1. Pain due to onychomycosis of toenails of both feet   2. Corns   3. Type 2 diabetes mellitus with diabetic polyneuropathy, without long-term current use of insulin  Denise [name])   Consent given for treatment. Patient examined. All patient's and/or POA's questions/concerns addressed on today's visit. Toenails 1-5 b/l debrided in length and girth without incident. Corn(s) dorsal PIPJ bilateral 5th digits pared with sharp debridement without incident. Continue foot and shoe inspections daily. Monitor blood glucose per PCP/Endocrinologist's recommendations.Continue soft, supportive shoe gear daily. Report any pedal injuries to medical professional. Call office if there are any questions/concerns. Return in about 3 months (around 08/17/2024).  Denise [name], DPM      [place] LOCATION: [address] [place], [place] [address]                   Office [phone]   [place] LOCATION: [address] [place], [place] [address] Office [phone]      [1]  Allergies Allergen Reactions   Doxazosin  Nausea And Vomiting    Dizziness    Clonidine Hydrochloride Other (See Comments)    Bradycardia   Erythromycin Palpitations   Hydrocodone -Acetaminophen  Nausea Only   Porcine (Pork) Protein-Containing Drug Products Diarrhea and Nausea Only   Rosiglitazone  Maleate Swelling    rosiglitazone   "

## 2024-05-20 ENCOUNTER — Encounter (HOSPITAL_COMMUNITY)
Admission: RE | Admit: 2024-05-20 | Discharge: 2024-05-20 | Disposition: A | Discharge status: Home / Self Care | Source: Ambulatory Visit | Attending: Physician Assistant | Admitting: Physician Assistant

## 2024-05-20 DIAGNOSIS — R1013 Epigastric pain: Secondary | ICD-10-CM | POA: Diagnosis present

## 2024-05-20 DIAGNOSIS — K219 Gastro-esophageal reflux disease without esophagitis: Secondary | ICD-10-CM | POA: Insufficient documentation

## 2024-05-20 MED ORDER — TECHNETIUM TC 99M SULFUR COLLOID
2.0000 | Freq: Once | INTRAVENOUS | Status: AC | PRN
  Administered 2024-05-20: 2.13 via INTRAVENOUS

## 2024-06-24 ENCOUNTER — Ambulatory Visit: Payer: Medicare Other

## 2024-07-14 ENCOUNTER — Ambulatory Visit: Admitting: Pediatrics

## 2024-07-21 ENCOUNTER — Ambulatory Visit (HOSPITAL_BASED_OUTPATIENT_CLINIC_OR_DEPARTMENT_OTHER): Admitting: Cardiovascular Disease

## 2024-07-23 ENCOUNTER — Ambulatory Visit: Admitting: Primary Care

## 2024-07-26 ENCOUNTER — Other Ambulatory Visit (HOSPITAL_BASED_OUTPATIENT_CLINIC_OR_DEPARTMENT_OTHER)

## 2024-08-18 ENCOUNTER — Ambulatory Visit: Admitting: Podiatry
